# Patient Record
Sex: Male | Born: 1950
Health system: Southern US, Community
[De-identification: ages and names within clinical notes are randomized; demographics above are authoritative.]

## PROBLEM LIST (undated history)

## (undated) DIAGNOSIS — R569 Unspecified convulsions: Secondary | ICD-10-CM

## (undated) DIAGNOSIS — Z8673 Personal history of transient ischemic attack (TIA), and cerebral infarction without residual deficits: Secondary | ICD-10-CM

## (undated) DIAGNOSIS — E785 Hyperlipidemia, unspecified: Secondary | ICD-10-CM

## (undated) DIAGNOSIS — R413 Other amnesia: Secondary | ICD-10-CM

## (undated) DIAGNOSIS — K648 Other hemorrhoids: Secondary | ICD-10-CM

## (undated) DIAGNOSIS — F1020 Alcohol dependence, uncomplicated: Secondary | ICD-10-CM

## (undated) DIAGNOSIS — G5622 Lesion of ulnar nerve, left upper limb: Secondary | ICD-10-CM

## (undated) DIAGNOSIS — Z7982 Long term (current) use of aspirin: Secondary | ICD-10-CM

## (undated) DIAGNOSIS — F0390 Unspecified dementia without behavioral disturbance: Secondary | ICD-10-CM

## (undated) DIAGNOSIS — K061 Gingival enlargement: Secondary | ICD-10-CM

## (undated) DIAGNOSIS — I639 Cerebral infarction, unspecified: Secondary | ICD-10-CM

## (undated) DIAGNOSIS — K449 Diaphragmatic hernia without obstruction or gangrene: Secondary | ICD-10-CM

## (undated) DIAGNOSIS — R269 Unspecified abnormalities of gait and mobility: Secondary | ICD-10-CM

## (undated) DIAGNOSIS — D126 Benign neoplasm of colon, unspecified: Secondary | ICD-10-CM

## (undated) DIAGNOSIS — I69398 Other sequelae of cerebral infarction: Secondary | ICD-10-CM

## (undated) DIAGNOSIS — B182 Chronic viral hepatitis C: Secondary | ICD-10-CM

## (undated) DIAGNOSIS — F1021 Alcohol dependence, in remission: Secondary | ICD-10-CM

## (undated) DIAGNOSIS — K743 Primary biliary cirrhosis: Secondary | ICD-10-CM

## (undated) DIAGNOSIS — I69359 Hemiplegia and hemiparesis following cerebral infarction affecting unspecified side: Secondary | ICD-10-CM

## (undated) DIAGNOSIS — K5790 Diverticulosis of intestine, part unspecified, without perforation or abscess without bleeding: Secondary | ICD-10-CM

## (undated) DIAGNOSIS — K219 Gastro-esophageal reflux disease without esophagitis: Secondary | ICD-10-CM

## (undated) HISTORY — DX: Gingival enlargement: K06.1

## (undated) HISTORY — DX: Alcohol dependence, in remission: F10.21

## (undated) HISTORY — DX: Chronic viral hepatitis C: B18.2

## (undated) HISTORY — DX: Unspecified abnormalities of gait and mobility: R26.9

## (undated) HISTORY — DX: Other sequelae of cerebral infarction: I69.398

## (undated) HISTORY — DX: Cerebral infarction, unspecified: I63.9

## (undated) HISTORY — DX: Unspecified convulsions: R56.9

## (undated) HISTORY — DX: Benign neoplasm of colon, unspecified: D12.6

## (undated) HISTORY — DX: Unspecified dementia, unspecified severity, without behavioral disturbance, psychotic disturbance, mood disturbance, and anxiety: F03.90

## (undated) HISTORY — DX: Diaphragmatic hernia without obstruction or gangrene: K44.9

## (undated) HISTORY — DX: Diverticulosis of intestine, part unspecified, without perforation or abscess without bleeding: K57.90

## (undated) HISTORY — DX: Hemiplegia and hemiparesis following cerebral infarction affecting unspecified side: I69.359

## (undated) HISTORY — DX: Primary biliary cirrhosis: K74.3

## (undated) HISTORY — DX: Other hemorrhoids: K64.8

## (undated) HISTORY — DX: Personal history of transient ischemic attack (TIA), and cerebral infarction without residual deficits: Z86.73

## (undated) HISTORY — DX: Lesion of ulnar nerve, left upper limb: G56.22

## (undated) HISTORY — PX: WRIST SURGERY: SHX841

## (undated) HISTORY — DX: Other amnesia: R41.3

## (undated) HISTORY — DX: Alcohol dependence, uncomplicated: F10.20

---

## 1998-02-20 ENCOUNTER — Emergency Department (HOSPITAL_COMMUNITY): Admission: EM | Admit: 1998-02-20 | Discharge: 1998-02-20 | Payer: Self-pay | Admitting: Emergency Medicine

## 1998-07-04 ENCOUNTER — Emergency Department (HOSPITAL_COMMUNITY): Admission: EM | Admit: 1998-07-04 | Discharge: 1998-07-04 | Payer: Self-pay | Admitting: Emergency Medicine

## 1999-02-02 ENCOUNTER — Encounter: Payer: Self-pay | Admitting: Emergency Medicine

## 1999-02-03 ENCOUNTER — Inpatient Hospital Stay (HOSPITAL_COMMUNITY): Admission: EM | Admit: 1999-02-03 | Discharge: 1999-02-16 | Payer: Self-pay | Admitting: Emergency Medicine

## 1999-02-03 ENCOUNTER — Encounter: Payer: Self-pay | Admitting: Neurology

## 1999-02-07 ENCOUNTER — Encounter: Payer: Self-pay | Admitting: Neurology

## 1999-02-08 ENCOUNTER — Encounter: Payer: Self-pay | Admitting: Neurology

## 1999-02-10 ENCOUNTER — Encounter: Payer: Self-pay | Admitting: *Deleted

## 1999-02-12 ENCOUNTER — Encounter: Payer: Self-pay | Admitting: Neurology

## 1999-02-14 ENCOUNTER — Encounter: Payer: Self-pay | Admitting: Neurology

## 1999-04-04 ENCOUNTER — Emergency Department (HOSPITAL_COMMUNITY): Admission: EM | Admit: 1999-04-04 | Discharge: 1999-04-04 | Payer: Self-pay | Admitting: Emergency Medicine

## 1999-05-30 ENCOUNTER — Inpatient Hospital Stay (HOSPITAL_COMMUNITY): Admission: EM | Admit: 1999-05-30 | Discharge: 1999-06-03 | Payer: Self-pay | Admitting: Emergency Medicine

## 1999-05-31 ENCOUNTER — Encounter: Payer: Self-pay | Admitting: *Deleted

## 1999-07-05 ENCOUNTER — Ambulatory Visit (HOSPITAL_BASED_OUTPATIENT_CLINIC_OR_DEPARTMENT_OTHER): Admission: RE | Admit: 1999-07-05 | Discharge: 1999-07-05 | Payer: Self-pay | Admitting: Orthopedic Surgery

## 1999-07-13 ENCOUNTER — Encounter: Admission: RE | Admit: 1999-07-13 | Discharge: 1999-08-24 | Payer: Self-pay | Admitting: Orthopedic Surgery

## 2000-03-08 ENCOUNTER — Encounter: Admission: RE | Admit: 2000-03-08 | Discharge: 2000-03-08 | Payer: Self-pay | Admitting: Cardiology

## 2000-03-08 ENCOUNTER — Encounter: Payer: Self-pay | Admitting: Cardiology

## 2003-01-24 ENCOUNTER — Emergency Department (HOSPITAL_COMMUNITY): Admission: EM | Admit: 2003-01-24 | Discharge: 2003-01-24 | Payer: Self-pay | Admitting: Emergency Medicine

## 2004-10-02 ENCOUNTER — Emergency Department (HOSPITAL_COMMUNITY): Admission: EM | Admit: 2004-10-02 | Discharge: 2004-10-02 | Payer: Self-pay | Admitting: Emergency Medicine

## 2006-07-27 ENCOUNTER — Emergency Department (HOSPITAL_COMMUNITY): Admission: EM | Admit: 2006-07-27 | Discharge: 2006-07-27 | Payer: Self-pay | Admitting: Emergency Medicine

## 2007-06-04 ENCOUNTER — Emergency Department (HOSPITAL_COMMUNITY): Admission: EM | Admit: 2007-06-04 | Discharge: 2007-06-04 | Payer: Self-pay | Admitting: Emergency Medicine

## 2009-03-10 ENCOUNTER — Emergency Department (HOSPITAL_COMMUNITY): Admission: EM | Admit: 2009-03-10 | Discharge: 2009-03-10 | Payer: Self-pay | Admitting: Emergency Medicine

## 2009-03-11 ENCOUNTER — Inpatient Hospital Stay (HOSPITAL_COMMUNITY): Admission: EM | Admit: 2009-03-11 | Discharge: 2009-03-12 | Payer: Self-pay | Admitting: Pathology

## 2009-06-15 ENCOUNTER — Emergency Department (HOSPITAL_COMMUNITY): Admission: EM | Admit: 2009-06-15 | Discharge: 2009-06-15 | Payer: Self-pay | Admitting: Emergency Medicine

## 2009-12-25 ENCOUNTER — Emergency Department (HOSPITAL_COMMUNITY): Admission: EM | Admit: 2009-12-25 | Discharge: 2009-12-25 | Payer: Self-pay | Admitting: Emergency Medicine

## 2010-05-01 ENCOUNTER — Emergency Department (HOSPITAL_COMMUNITY): Admission: EM | Admit: 2010-05-01 | Discharge: 2010-05-01 | Payer: Self-pay | Admitting: Family Medicine

## 2010-05-03 ENCOUNTER — Emergency Department (HOSPITAL_COMMUNITY): Admission: EM | Admit: 2010-05-03 | Discharge: 2010-05-03 | Payer: Self-pay | Admitting: Emergency Medicine

## 2010-05-07 ENCOUNTER — Emergency Department (HOSPITAL_COMMUNITY): Admission: EM | Admit: 2010-05-07 | Discharge: 2010-05-07 | Payer: Self-pay | Admitting: Emergency Medicine

## 2010-05-16 ENCOUNTER — Emergency Department (HOSPITAL_COMMUNITY): Admission: EM | Admit: 2010-05-16 | Discharge: 2010-05-16 | Payer: Self-pay | Admitting: Emergency Medicine

## 2011-01-06 LAB — COMPREHENSIVE METABOLIC PANEL
ALT: 10 U/L (ref 0–53)
AST: 14 U/L (ref 0–37)
Albumin: 3.4 g/dL — ABNORMAL LOW (ref 3.5–5.2)
Alkaline Phosphatase: 87 U/L (ref 39–117)
BUN: 6 mg/dL (ref 6–23)
Creatinine, Ser: 0.74 mg/dL (ref 0.4–1.5)
GFR calc Af Amer: >60 mL/min (ref >60)
Glucose, Bld: 87 mg/dL (ref 70–99)
Potassium: 4.3 mEq/L (ref 3.5–5.1)
Sodium: 132 mEq/L — ABNORMAL LOW (ref 135–145)
Total Bilirubin: 0.4 mg/dL (ref 0.3–1.2)
Total Protein: 7 g/dL (ref 6.0–8.3)

## 2011-01-06 LAB — DIFFERENTIAL
Basophils Absolute: 0 10*3/uL (ref 0.0–0.1)
Basophils Relative: 0 % (ref 0–1)
Eosinophils Absolute: 0 10*3/uL (ref 0.0–0.7)
Eosinophils Relative: 0 % (ref 0–5)
Lymphocytes Relative: 29 % (ref 12–46)
Lymphs Abs: 1.7 10*3/uL (ref 0.7–4.0)
Monocytes Absolute: 0.6 10*3/uL (ref 0.1–1.0)
Monocytes Relative: 10 % (ref 3–12)
Neutro Abs: 3.6 10*3/uL (ref 1.7–7.7)
Neutrophils Relative %: 61 % (ref 43–77)

## 2011-01-06 LAB — CBC
HCT: 35.6 % — ABNORMAL LOW (ref 39.0–52.0)
Hemoglobin: 12.5 g/dL — ABNORMAL LOW (ref 13.0–17.0)
MCH: 37 pg — ABNORMAL HIGH (ref 26.0–34.0)
MCV: 105.6 fL — ABNORMAL HIGH (ref 78.0–100.0)
Platelets: 123 10*3/uL — ABNORMAL LOW (ref 150–400)
RBC: 3.37 MIL/uL — ABNORMAL LOW (ref 4.22–5.81)
RDW: 12.9 % (ref 11.5–15.5)
WBC: 5.9 10*3/uL (ref 4.0–10.5)

## 2011-01-06 LAB — URINALYSIS, ROUTINE W REFLEX MICROSCOPIC
Glucose, UA: NEGATIVE mg/dL
Hgb urine dipstick: NEGATIVE
Ketones, ur: NEGATIVE mg/dL
Nitrite: NEGATIVE
Protein, ur: NEGATIVE mg/dL
Specific Gravity, Urine: 1.016 (ref 1.005–1.030)
Urobilinogen, UA: 1 mg/dL (ref 0.0–1.0)
pH: 7.5 (ref 5.0–8.0)

## 2011-01-06 LAB — VALPROIC ACID LEVEL: Valproic Acid Lvl: 95.1 ug/mL (ref 50.0–100.0)

## 2011-01-14 LAB — DIFFERENTIAL
Basophils Absolute: 0 10*3/uL (ref 0.0–0.1)
Basophils Relative: 1 % (ref 0–1)
Eosinophils Relative: 1 % (ref 0–5)
Lymphocytes Relative: 39 % (ref 12–46)
Lymphs Abs: 2.1 10*3/uL (ref 0.7–4.0)
Monocytes Absolute: 0.6 10*3/uL (ref 0.1–1.0)
Monocytes Relative: 12 % (ref 3–12)
Neutro Abs: 2.5 10*3/uL (ref 1.7–7.7)

## 2011-01-14 LAB — BASIC METABOLIC PANEL
BUN: 13 mg/dL (ref 6–23)
CO2: 29 mEq/L (ref 19–32)
Calcium: 8.7 mg/dL (ref 8.4–10.5)
Creatinine, Ser: 0.74 mg/dL (ref 0.4–1.5)
GFR calc Af Amer: >60 mL/min (ref >60)
GFR calc non Af Amer: >60 mL/min (ref >60)
Glucose, Bld: 88 mg/dL (ref 70–99)
Potassium: 4.7 mEq/L (ref 3.5–5.1)
Sodium: 139 mEq/L (ref 135–145)

## 2011-01-14 LAB — URINALYSIS, ROUTINE W REFLEX MICROSCOPIC
Bilirubin Urine: NEGATIVE
Hgb urine dipstick: NEGATIVE
Nitrite: NEGATIVE
Protein, ur: NEGATIVE mg/dL
Specific Gravity, Urine: 1.019 (ref 1.005–1.030)
Urobilinogen, UA: 1 mg/dL (ref 0.0–1.0)
pH: 8 (ref 5.0–8.0)

## 2011-01-14 LAB — CBC
HCT: 36.1 % — ABNORMAL LOW (ref 39.0–52.0)
Hemoglobin: 12.4 g/dL — ABNORMAL LOW (ref 13.0–17.0)
MCHC: 34.5 g/dL (ref 30.0–36.0)
MCV: 107.3 fL — ABNORMAL HIGH (ref 78.0–100.0)
RBC: 3.36 MIL/uL — ABNORMAL LOW (ref 4.22–5.81)
RDW: 11.9 % (ref 11.5–15.5)
WBC: 5.3 10*3/uL (ref 4.0–10.5)

## 2011-01-14 LAB — PHENYTOIN LEVEL, TOTAL: Phenytoin Lvl: 12.9 ug/mL (ref 10.0–20.0)

## 2011-01-14 LAB — VALPROIC ACID LEVEL: Valproic Acid Lvl: 97.4 ug/mL (ref 50.0–100.0)

## 2011-01-27 LAB — VALPROIC ACID LEVEL: Valproic Acid Lvl: 51.1 ug/mL (ref 50.0–100.0)

## 2011-01-27 LAB — POCT I-STAT, CHEM 8
Chloride: 101 mEq/L (ref 96–112)
Glucose, Bld: 85 mg/dL (ref 70–99)
HCT: 38 % — ABNORMAL LOW (ref 39.0–52.0)
Hemoglobin: 12.9 g/dL — ABNORMAL LOW (ref 13.0–17.0)
Potassium: 4.7 mEq/L (ref 3.5–5.1)
Sodium: 140 mEq/L (ref 135–145)

## 2011-01-27 LAB — GLUCOSE, CAPILLARY: Glucose-Capillary: 78 mg/dL (ref 70–99)

## 2011-01-27 LAB — PHENYTOIN LEVEL, TOTAL: Phenytoin Lvl: 16.5 ug/mL (ref 10.0–20.0)

## 2011-01-30 LAB — RAPID URINE DRUG SCREEN, HOSP PERFORMED
Barbiturates: NOT DETECTED
Barbiturates: NOT DETECTED
Benzodiazepines: NOT DETECTED
Cocaine: NOT DETECTED
Cocaine: NOT DETECTED
Opiates: NOT DETECTED
Opiates: NOT DETECTED

## 2011-01-30 LAB — FUNGUS CULTURE W SMEAR

## 2011-01-30 LAB — CBC
Hemoglobin: 14.3 g/dL (ref 13.0–17.0)
Hemoglobin: 14.6 g/dL (ref 13.0–17.0)
MCHC: 35.6 g/dL (ref 30.0–36.0)
MCV: 99.8 fL (ref 78.0–100.0)
MCV: 99.9 fL (ref 78.0–100.0)
RBC: 4.05 MIL/uL — ABNORMAL LOW (ref 4.22–5.81)
RBC: 4.1 MIL/uL — ABNORMAL LOW (ref 4.22–5.81)
RDW: 12.5 % (ref 11.5–15.5)
WBC: 4.9 10*3/uL (ref 4.0–10.5)

## 2011-01-30 LAB — DIFFERENTIAL
Lymphocytes Relative: 33 % (ref 12–46)
Lymphs Abs: 1.3 10*3/uL (ref 0.7–4.0)
Lymphs Abs: 2.1 10*3/uL (ref 0.7–4.0)
Monocytes Relative: 12 % (ref 3–12)
Monocytes Relative: 12 % (ref 3–12)
Neutro Abs: 2.1 10*3/uL (ref 1.7–7.7)
Neutro Abs: 2.1 10*3/uL (ref 1.7–7.7)
Neutrophils Relative %: 43 % (ref 43–77)
Neutrophils Relative %: 52 % (ref 43–77)

## 2011-01-30 LAB — COMPREHENSIVE METABOLIC PANEL
Albumin: 3.7 g/dL (ref 3.5–5.2)
Alkaline Phosphatase: 68 U/L (ref 39–117)
BUN: 10 mg/dL (ref 6–23)
CO2: 28 mEq/L (ref 19–32)
CO2: 30 mEq/L (ref 19–32)
Calcium: 9.3 mg/dL (ref 8.4–10.5)
Chloride: 100 mEq/L (ref 96–112)
Creatinine, Ser: 0.83 mg/dL (ref 0.4–1.5)
Creatinine, Ser: 0.84 mg/dL (ref 0.4–1.5)
GFR calc Af Amer: >60 mL/min (ref >60)
GFR calc non Af Amer: >60 mL/min (ref >60)
GFR calc non Af Amer: >60 mL/min (ref >60)
Glucose, Bld: 89 mg/dL (ref 70–99)
Potassium: 4 mEq/L (ref 3.5–5.1)
Sodium: 133 mEq/L — ABNORMAL LOW (ref 135–145)
Total Bilirubin: 0.7 mg/dL (ref 0.3–1.2)
Total Protein: 7.8 g/dL (ref 6.0–8.3)

## 2011-01-30 LAB — GLUCOSE, CAPILLARY: Glucose-Capillary: 81 mg/dL (ref 70–99)

## 2011-01-30 LAB — URINALYSIS, ROUTINE W REFLEX MICROSCOPIC
Glucose, UA: NEGATIVE mg/dL
Hgb urine dipstick: NEGATIVE
Nitrite: NEGATIVE
Protein, ur: NEGATIVE mg/dL
Specific Gravity, Urine: 1.018 (ref 1.005–1.030)
Specific Gravity, Urine: 1.021 (ref 1.005–1.030)
Urobilinogen, UA: 2 mg/dL — ABNORMAL HIGH (ref 0.0–1.0)
pH: 7.5 (ref 5.0–8.0)
pH: 7.5 (ref 5.0–8.0)

## 2011-01-30 LAB — CSF CELL COUNT WITH DIFFERENTIAL
RBC Count, CSF: 0 /mm3
Tube #: 4
WBC, CSF: 2 /mm3 (ref 0–5)

## 2011-01-30 LAB — CSF CULTURE W GRAM STAIN: Culture: NO GROWTH

## 2011-01-30 LAB — CULTURE, BLOOD (ROUTINE X 2): Culture: NO GROWTH

## 2011-01-30 LAB — VALPROIC ACID LEVEL
Valproic Acid Lvl: 124.9 ug/mL — ABNORMAL HIGH (ref 50.0–100.0)
Valproic Acid Lvl: 69.1 ug/mL (ref 50.0–100.0)

## 2011-01-30 LAB — HSV PCR: HSV, PCR: NOT DETECTED

## 2011-01-30 LAB — PROTEIN AND GLUCOSE, CSF: Total  Protein, CSF: 92 mg/dL — ABNORMAL HIGH (ref 15–45)

## 2011-03-06 NOTE — Procedures (Signed)
EEG NUMBER:  [id number].   CLINICAL HISTORY:  This 60 year old patient is being evaluated for  seizure.  Medication listed are Haldol, Ativan, Dilantin, Depacon, and  Tylenol.   This is a portable EEG recorded with the patient asleep using standard  10/20 electrode placement on a 17-channel machine.   Background awake rhythm consists of 8-9 Hz alpha which is of diminished  amplitude, synchronous, reactive to eye opening and closure.  No  paroxysmal epileptiform activities, spikes or sharp waves are noted.  Stages I and II of light sleep are noted and uneventful.  Length of the  recording is 20.7 minutes.  Technical component is average.  EKG tracing  reveals regular sinus rhythm.  Hyperventilation is not performed.  Photic stimulation is unremarkable.   IMPRESSION:  This EEG performed during awake and asleep state is within  normal limits.  No definite epileptiform features were noted.           ______________________________  Sunny Schlein. Pearlean Brownie, MD     ZOX:[id number]  D:  03/11/2009 16:15:05  T:  03/12/2009 08:18:01  Job #:  [id number]   cc:   Levert Feinstein, MD

## 2011-03-06 NOTE — H&P (Signed)
[name], [name] NO.:  [id number]   MEDICAL RECORD NO.:  [id number]          PATIENT TYPE:  INP   LOCATION:  3732                         FACILITY:  [facility]   PHYSICIAN:  Levert Feinstein, MD          DATE OF BIRTH:  12-Jun-1951   DATE OF ADMISSION:  03/10/2009  DATE OF DISCHARGE:                              HISTORY & PHYSICAL   CHIEF COMPLAINT:  Recurrent seizure x3.   HISTORY OF PRESENT ILLNESS:  The patient is a 60 year old African  American male accompanied by 2 of his sister at ER visit.   He was Dr. Imagene [name] patient, for epilepsy, he developed seizure disorder  about 10 years ago, had a history of alcohol abuse, but quit about 10  years ago also suffered a stroke involving right frontal parietal  regions.  At baseline, his seizure is well controlled, recurrent about  every 4 months, taking Depakote 500 mg 3 tablets and Dilantin 100 mg 1  tablet b.i.d.  Today, VPA 69.5, Dilantin 21.6.   He had one seizure at 4:00 p.m., fell to the ground whole body shaking,  lasted about a couple of minutes followed by confusion for 15 minutes  taken to the emergency room, back to baseline, later discharged home.   Second seizure was on 7:30pm, he was having dinner with family and  noticed to have forceful head deviation to his left side, whole body  tonic-clonic movement, again lasted couple of minutes and taken back to  the emergency room, had another seizure where at ED around 9:30pm, whole  body tonic-clonic movement lasted for few minutes.  When checking on him  about 1 hour later, he was little bit confused and more memory trouble  than baseline, he was not able to recognize his family member, but know  his name and know he is at the hospital.   REVIEW OF SYSTEMS:  Denies fever, fairly good appetite, no coughing and  now he complainsof a mild headache.   PAST MEDICAL HISTORY:  Stroke and seizure disorder and a remote history  of alcohol abuse.   SURGICAL HISTORY:   Left hand surgery.   SOCIAL HISTORY:  He lives with his elderly sister who has been taking  care of him and compliant with the medications and he smokes, but no  drugs, no drinking.   FAMILY HISTORY:  Noncontributory.   HOME MEDICATIONS:  1. Depakote 500 mg 3 b.i.d.  2. Dilantin 100 mg 1 tablet b.i.d.   ALLERGIES:  No known drug allergies.   PHYSICAL EXAMINATION:  VITAL SIGNS:  Temperature was 97.6, blood  pressure 134/71, heart rate of 73, and respirations of 16.  CARDIAC:  Regular rate and rhythm.  PULMONARY:  Clear to auscultation bilaterally.  Awake, agitated and not  recognizing his family members, but oriented to hospital and his name.  Cranial nerves II through XII was normal.  Motor examination has mild  spastic left upper extremity weakness, deformity of left hand due to  previous surgery.  Deep tendon reflexes normal and symmetric.  Plantar  responses were flexor.  CT of the brain without contrast revealing right  frontal parietal area, old CVA, no acute lesions Dilantin 21.6, VPA  69.5.   ASSESSMENT AND PLAN:  Recurrent seizure.  1. Increase Depakote to maintenance 2000 mg b.i.d.  We will load with      IV now.  2. Keep  Dilantin 100 b.i.d., admit to the hospital for close      monitoring.      Levert Feinstein, MD  Electronically Signed     YY/MEDQ  D:  03/10/2009  T:  03/11/2009  Job:  [id number]

## 2011-03-09 NOTE — Discharge Summary (Signed)
[name], MACLAUGHLIN NO.:  [id number]   MEDICAL RECORD NO.:  [id number]          PATIENT TYPE:  INP   LOCATION:  3732                         FACILITY:  [facility]   PHYSICIAN:  Levert Feinstein, MD          DATE OF BIRTH:  11/17/1950   DATE OF ADMISSION:  03/10/2009  DATE OF DISCHARGE:  03/12/2009                               DISCHARGE SUMMARY   DISCHARGE DIAGNOSES:  1. Recurrent seizure.  2. history of stroke involving right frontoparietal region.  3. Smoker.  4. Epilepsy.  5. Remote history of alcohol abuse.   DISCHARGE MEDICATIONS:  1. Depakote 2000 mg b.i.d. (500 mg 4 tablets b.i.d.).  2. Dilantin 100 mg b.i.d.   HOSPITAL COURSE:  The patient is a 60 year old African American male  with history of seizure for 10 years, had a remote history of alcohol  abuse, also with a history of stroke involving right frontoparietal  region.  At baseline, his seizure was well controlled, recurrent about  every 4 months, prior to his hospital admission on Mar 10, 2009, he was  taking Depakote 500 three tablets b.i.d., and Dilantin 100 mg b.i.d.  Upon admission, Depakote level was 69.5, Dilantin level 21.6.   On Mar 10, 2009, he had recurrent seizure at 4:00 p.m., later had 7:30  p.m., and also 9:30 p.m.  He had 3 seizures within 12 hours period of  time, described forceful head deviation to the left side, followed by  whole body tonic-clonic movements lasted couple of minutes, and post-  event confusion.   During his hospital staying, he was also noticed to have fever, T-max is  101.5.  For that reason, he has undergone fluoro-guided lumbar puncture,  which showed WBC of 2, RBC of 0, protein of 92, glucose of 33, but  herpes simplex virus PCR was negative.  Blood culture was negative.  Lab  evaluation demonstrated normal CBC, CMP with the exception of mildly  decreased sodium of 133. Chest x-ray was clear.   There was no recurrent seizure.  The patient was back to his  baseline  and discharged home.   On examination, he is awake, alert, oriented to his name and place.  Cranial nerve II through XII examination was normal.  motor examination,  he has had left hand deformity due to previous surgery.  There was also  mild spastic left-sided weakness, and deep tendon reflex was normal and  symmetric.  Plantar responses were flexor.   CAT scan of the brain has demonstrated right frontoparietal area old  stroke but no acute lesion.   He is discharged home with medications:  1. Depakote 500 mg 4 tablets b.i.d.  2. Dilantin 100 mg b.i.d.   Follow up with Dr. Sandria Manly in 1-2 months.      Levert Feinstein, MD  Electronically Signed     Levert Feinstein, MD  Electronically Signed    YY/MEDQ  D:  05/03/2009  T:  05/03/2009  Job:  [id number]

## 2011-04-07 ENCOUNTER — Emergency Department (HOSPITAL_COMMUNITY)
Admission: EM | Admit: 2011-04-07 | Discharge: 2011-04-07 | Disposition: A | Discharge status: Home / Self Care | Payer: Medicare Other | Attending: Emergency Medicine | Admitting: Emergency Medicine

## 2011-04-07 DIAGNOSIS — I10 Essential (primary) hypertension: Secondary | ICD-10-CM | POA: Insufficient documentation

## 2011-04-07 DIAGNOSIS — Z8673 Personal history of transient ischemic attack (TIA), and cerebral infarction without residual deficits: Secondary | ICD-10-CM | POA: Insufficient documentation

## 2011-04-07 DIAGNOSIS — Z9119 Patient's noncompliance with other medical treatment and regimen: Secondary | ICD-10-CM | POA: Insufficient documentation

## 2011-04-07 DIAGNOSIS — G40909 Epilepsy, unspecified, not intractable, without status epilepticus: Secondary | ICD-10-CM | POA: Insufficient documentation

## 2011-04-07 DIAGNOSIS — Z79899 Other long term (current) drug therapy: Secondary | ICD-10-CM | POA: Insufficient documentation

## 2011-04-07 DIAGNOSIS — E7800 Pure hypercholesterolemia, unspecified: Secondary | ICD-10-CM | POA: Insufficient documentation

## 2011-04-07 DIAGNOSIS — Z91199 Patient's noncompliance with other medical treatment and regimen due to unspecified reason: Secondary | ICD-10-CM | POA: Insufficient documentation

## 2011-04-07 LAB — CBC
Hemoglobin: 13.5 g/dL (ref 13.0–17.0)
MCH: 35.9 pg — ABNORMAL HIGH (ref 26.0–34.0)
MCHC: 35.9 g/dL (ref 30.0–36.0)
RDW: 12.8 % (ref 11.5–15.5)

## 2011-04-07 LAB — BASIC METABOLIC PANEL
BUN: 13 mg/dL (ref 6–23)
Calcium: 9.1 mg/dL (ref 8.4–10.5)
GFR calc non Af Amer: >60 mL/min (ref >60)
Glucose, Bld: 82 mg/dL (ref 70–99)
Sodium: 138 mEq/L (ref 135–145)

## 2011-04-07 LAB — DIFFERENTIAL
Basophils Absolute: 0 10*3/uL (ref 0.0–0.1)
Eosinophils Absolute: 0.2 10*3/uL (ref 0.0–0.7)
Lymphocytes Relative: 39 % (ref 12–46)
Lymphs Abs: 2.5 10*3/uL (ref 0.7–4.0)
Neutrophils Relative %: 43 % (ref 43–77)

## 2011-04-20 ENCOUNTER — Emergency Department (HOSPITAL_COMMUNITY): Payer: Medicare Other

## 2011-04-20 ENCOUNTER — Emergency Department (HOSPITAL_COMMUNITY)
Admission: EM | Admit: 2011-04-20 | Discharge: 2011-04-20 | Disposition: A | Discharge status: Home / Self Care | Payer: Medicare Other | Attending: Emergency Medicine | Admitting: Emergency Medicine

## 2011-04-20 DIAGNOSIS — I10 Essential (primary) hypertension: Secondary | ICD-10-CM | POA: Insufficient documentation

## 2011-04-20 DIAGNOSIS — R5383 Other fatigue: Secondary | ICD-10-CM | POA: Insufficient documentation

## 2011-04-20 DIAGNOSIS — R5381 Other malaise: Secondary | ICD-10-CM | POA: Insufficient documentation

## 2011-04-20 DIAGNOSIS — E7800 Pure hypercholesterolemia, unspecified: Secondary | ICD-10-CM | POA: Insufficient documentation

## 2011-04-20 DIAGNOSIS — R404 Transient alteration of awareness: Secondary | ICD-10-CM | POA: Insufficient documentation

## 2011-04-20 DIAGNOSIS — G40909 Epilepsy, unspecified, not intractable, without status epilepticus: Secondary | ICD-10-CM | POA: Insufficient documentation

## 2011-04-20 DIAGNOSIS — Z8673 Personal history of transient ischemic attack (TIA), and cerebral infarction without residual deficits: Secondary | ICD-10-CM | POA: Insufficient documentation

## 2011-04-20 LAB — COMPREHENSIVE METABOLIC PANEL
AST: 23 U/L (ref 0–37)
Albumin: 3.5 g/dL (ref 3.5–5.2)
Calcium: 8.9 mg/dL (ref 8.4–10.5)
Creatinine, Ser: 0.79 mg/dL (ref 0.50–1.35)
GFR calc non Af Amer: >60 mL/min (ref >60)

## 2011-04-20 LAB — CBC
MCH: 35.2 pg — ABNORMAL HIGH (ref 26.0–34.0)
MCHC: 34.8 g/dL (ref 30.0–36.0)
MCV: 101.3 fL — ABNORMAL HIGH (ref 78.0–100.0)
Platelets: DECREASED 10*3/uL (ref 150–400)
RDW: 12.9 % (ref 11.5–15.5)

## 2011-04-20 LAB — DIFFERENTIAL
Eosinophils Absolute: 0.2 10*3/uL (ref 0.0–0.7)
Eosinophils Relative: 3 % (ref 0–5)
Lymphs Abs: 2.6 10*3/uL (ref 0.7–4.0)
Monocytes Relative: 14 % — ABNORMAL HIGH (ref 3–12)

## 2011-04-20 LAB — VALPROIC ACID LEVEL: Valproic Acid Lvl: 89.3 ug/mL (ref 50.0–100.0)

## 2011-08-06 LAB — DIFFERENTIAL
Eosinophils Absolute: 0
Lymphocytes Relative: 17
Lymphs Abs: 1.2
Monocytes Relative: 3
Neutrophils Relative %: 79 — ABNORMAL HIGH

## 2011-08-06 LAB — CBC
MCV: 100.8 — ABNORMAL HIGH
RBC: 3.99 — ABNORMAL LOW
WBC: 7.1

## 2011-08-06 LAB — BASIC METABOLIC PANEL
Chloride: 102
Creatinine, Ser: 0.86
GFR calc Af Amer: >60
Potassium: 5.3 — ABNORMAL HIGH

## 2011-08-06 LAB — VALPROIC ACID LEVEL: Valproic Acid Lvl: 57.9

## 2012-07-24 DIAGNOSIS — I6330 Cerebral infarction due to thrombosis of unspecified cerebral artery: Secondary | ICD-10-CM | POA: Insufficient documentation

## 2012-07-24 DIAGNOSIS — F329 Major depressive disorder, single episode, unspecified: Secondary | ICD-10-CM | POA: Insufficient documentation

## 2012-07-24 DIAGNOSIS — G40209 Localization-related (focal) (partial) symptomatic epilepsy and epileptic syndromes with complex partial seizures, not intractable, without status epilepticus: Secondary | ICD-10-CM | POA: Insufficient documentation

## 2012-07-24 DIAGNOSIS — Z79899 Other long term (current) drug therapy: Secondary | ICD-10-CM | POA: Insufficient documentation

## 2012-07-24 DIAGNOSIS — R269 Unspecified abnormalities of gait and mobility: Secondary | ICD-10-CM | POA: Insufficient documentation

## 2012-07-24 DIAGNOSIS — G5620 Lesion of ulnar nerve, unspecified upper limb: Secondary | ICD-10-CM | POA: Insufficient documentation

## 2012-07-24 DIAGNOSIS — F09 Unspecified mental disorder due to known physiological condition: Secondary | ICD-10-CM | POA: Insufficient documentation

## 2012-07-24 DIAGNOSIS — F068 Other specified mental disorders due to known physiological condition: Secondary | ICD-10-CM | POA: Insufficient documentation

## 2013-03-03 ENCOUNTER — Encounter: Payer: Self-pay | Admitting: Neurology

## 2013-03-03 DIAGNOSIS — G40209 Localization-related (focal) (partial) symptomatic epilepsy and epileptic syndromes with complex partial seizures, not intractable, without status epilepticus: Secondary | ICD-10-CM

## 2013-03-03 DIAGNOSIS — R269 Unspecified abnormalities of gait and mobility: Secondary | ICD-10-CM

## 2013-03-03 DIAGNOSIS — F329 Major depressive disorder, single episode, unspecified: Secondary | ICD-10-CM

## 2013-03-03 DIAGNOSIS — F068 Other specified mental disorders due to known physiological condition: Secondary | ICD-10-CM

## 2013-03-03 DIAGNOSIS — I6330 Cerebral infarction due to thrombosis of unspecified cerebral artery: Secondary | ICD-10-CM

## 2013-03-03 DIAGNOSIS — F09 Unspecified mental disorder due to known physiological condition: Secondary | ICD-10-CM

## 2013-03-03 DIAGNOSIS — Z79899 Other long term (current) drug therapy: Secondary | ICD-10-CM

## 2013-03-04 ENCOUNTER — Encounter: Payer: Self-pay | Admitting: Neurology

## 2013-03-04 ENCOUNTER — Ambulatory Visit (INDEPENDENT_AMBULATORY_CARE_PROVIDER_SITE_OTHER): Payer: Medicare Other | Admitting: Neurology

## 2013-03-04 VITALS — BP 105/65 | HR 64 | Ht 68.0 in | Wt 156.0 lb

## 2013-03-04 DIAGNOSIS — G40209 Localization-related (focal) (partial) symptomatic epilepsy and epileptic syndromes with complex partial seizures, not intractable, without status epilepticus: Secondary | ICD-10-CM

## 2013-03-04 DIAGNOSIS — R269 Unspecified abnormalities of gait and mobility: Secondary | ICD-10-CM

## 2013-03-04 DIAGNOSIS — R413 Other amnesia: Secondary | ICD-10-CM

## 2013-03-04 DIAGNOSIS — Z5181 Encounter for therapeutic drug level monitoring: Secondary | ICD-10-CM

## 2013-03-04 HISTORY — DX: Other amnesia: R41.3

## 2013-03-04 NOTE — Progress Notes (Signed)
Reason for visit: Seizures  [name] is an 62 y.o. male  History of present illness:   [name] is a 62 year old left-handed black male with a history of seizures and cerebrovascular disease. The patient has sustained a right thalamic stroke in 2000, and he has had a chronic right frontal stroke as well. The patient began having seizures that had initially been quite difficult to control. The patient has been on Dilantin and Depakote, and he has not had a seizure in over 2 years. The patient has had a memory disorder since the stroke, and he does not operate a motor vehicle. The patient has a chronic gait disorder, but he has not fallen recently. The patient returns for an evaluation. No other new medical issues have come up since he was here last in the fall of 2013. The patient is tolerating the medications well.  Past Medical History  Diagnosis Date  . History of alcoholism   . Hx of ischemic vertebrobasilar artery thalamic stroke     Right  . Gait disorder   . Dementia   . 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 Seizures   . Memory disorder 03/04/2013  . Gait disorder   . Stroke     Right frontal, right thalamic  . Ulnar neuropathy of left upper extremity   . Alcoholism     History of, not active    Past Surgical History  Procedure Laterality Date  . Wrist surgery Left 95 & 96    Family History  Problem Relation Age of Onset  . Pulmonary embolism Mother   . Liver disease Father     Social history:  reports that he has been smoking.  He does not have any smokeless tobacco history on file. He reports that he does not drink alcohol or use illicit drugs.  Allergies: No Known Allergies  Medications:  Current Outpatient Prescriptions on File Prior to Visit  Medication Sig Dispense Refill  . amLODipine (NORVASC) 2.5 MG tablet Take 2.5 mg by mouth daily.      . Cholecalciferol (VITAMIN D) 2000 UNITS CAPS Take 2,000 Units by mouth daily.      . divalproex  (DEPAKOTE) 500 MG DR tablet Take 1,500 mg by mouth 2 (two) times daily.       . metoprolol succinate (TOPROL-XL) 25 MG 24 hr tablet Take 25 mg by mouth daily.      . phenytoin (DILANTIN) 100 MG ER capsule Take 100 mg by mouth 2 (two) times daily.       . simvastatin (ZOCOR) 20 MG tablet Take 20 mg by mouth every evening.       No current facility-administered medications on file prior to visit.    ROS:  Out of a complete 14 system review of symptoms, the patient complains only of the following symptoms, and all other reviewed systems are negative.  Seizures Gait instability Memory disorder   Blood pressure 105/65, pulse 64, height 5\' 8"  (1.727 m), weight 156 lb (70.761 kg).  Physical Exam  General: The patient is alert and cooperative at the time of the examination.  Skin: No significant peripheral edema is noted.   Neurologic Exam  Mental status: The Mini-Mental status examination done today shows a total score of 28/30. The patient is able to name 8 animals in 60 seconds.  Cranial nerves: Facial symmetry is present. Speech is normal, no aphasia or dysarthria is noted. Extraocular movements are full. Visual fields are  full.  Motor: The patient has good strength in all 4 extremities, with the exception that there is slight, 4+ over 5 strength, throughout the left arm.  Coordination: The patient has good finger-nose-finger bilaterally. The patient has severe apraxia with heel-to-shin bilaterally.  Gait and station: The patient has a wide-based gait, slightly unsteady. Tandem gait is very apraxic, unsteady. Romberg is positive, the patient tends to fall to the right. No drift is seen.  Reflexes: Deep tendon reflexes are symmetric.   Assessment/Plan:  1. History seizures  2. Cerebrovascular disease  3. Gait disorder  4. Memory disorder  The patient has done well with his seizure control. The patient has a history of cerebrovascular disease that is the likely etiology  of his seizures. The patient however, is not on low-dose aspirin. I have recommended that he start aspirin at 81 mg daily. The patient will continue the Depakote and Dilantin, and blood work will be checked today. The patient will followup in one year. The patient does not operate a motor vehicle. The memory issues will be followed over time.  Joshua Palau MD 03/04/2013 7:51 PM  [facility] [address] [place], [place] [address]  Phone [phone] Fax [phone]

## 2013-03-05 ENCOUNTER — Telehealth: Payer: Self-pay | Admitting: Neurology

## 2013-03-05 LAB — COMPREHENSIVE METABOLIC PANEL
Albumin/Globulin Ratio: 1.4 (ref 1.1–2.5)
Albumin: 4.3 g/dL (ref 3.6–4.8)
BUN: 16 mg/dL (ref 8–27)
Calcium: 9.5 mg/dL (ref 8.6–10.2)
Creatinine, Ser: 0.89 mg/dL (ref 0.76–1.27)
GFR calc non Af Amer: 93 mL/min/{1.73_m2} (ref >59)
Globulin, Total: 3.1 g/dL (ref 1.5–4.5)
Glucose: 68 mg/dL (ref 65–99)
Sodium: 140 mmol/L (ref 134–144)
Total Protein: 7.4 g/dL (ref 6.0–8.5)

## 2013-03-05 LAB — CBC WITH DIFFERENTIAL
Eos: 1 % (ref 0–5)
HCT: 40 % (ref 37.5–51.0)
Lymphocytes Absolute: 2.4 10*3/uL (ref 0.7–3.1)
Lymphs: 47 % — ABNORMAL HIGH (ref 14–46)
MCV: 99 fL — ABNORMAL HIGH (ref 79–97)
Monocytes: 11 % (ref 4–12)
Neutrophils Absolute: 2.1 10*3/uL (ref 1.4–7.0)
RBC: 4.06 x10E6/uL — ABNORMAL LOW (ref 4.14–5.80)
WBC: 5.2 10*3/uL (ref 3.4–10.8)

## 2013-03-05 LAB — VALPROIC ACID LEVEL: Valproic Acid Lvl: 131 ug/mL (ref 50–100)

## 2013-03-05 LAB — AMMONIA: Ammonia: 143 ug/dL (ref 27–102)

## 2013-03-05 NOTE — Telephone Encounter (Signed)
I called the caregiver. The Depakote level was in the 130 range, a random level. The ammonia level was elevated as well. I will drop the Depakote dose taking 1000 mg twice daily. We will recheck blood work in 3 weeks.

## 2013-03-06 NOTE — Progress Notes (Signed)
Quick Note:  I called and spoke to caregiver. The pt will take depakote 1000mg  po bid. Will get redraw in 3 wks (trough level). He takes evening dose 7pm. Will bring medication with him and take after blood drawn at 0800 in 3 wks. ______

## 2013-03-27 ENCOUNTER — Telehealth: Payer: Self-pay | Admitting: Neurology

## 2013-03-27 DIAGNOSIS — Z5181 Encounter for therapeutic drug level monitoring: Secondary | ICD-10-CM

## 2013-03-27 NOTE — Telephone Encounter (Signed)
Message copied by Stephanie Acre on Fri Mar 27, 2013  7:56 AM ------      Message from: Stephanie Acre      Created: Thu Mar 05, 2013  5:05 PM       Recheck ammonia level, and Depakote level. ------

## 2013-03-27 NOTE — Telephone Encounter (Signed)
I called patient. The patient did come in to have the Depakote level and ammonia level rechecked.

## 2013-03-27 NOTE — Telephone Encounter (Signed)
Noted  

## 2013-03-30 ENCOUNTER — Other Ambulatory Visit: Payer: Self-pay | Admitting: Neurology

## 2013-03-31 ENCOUNTER — Telehealth: Payer: Self-pay | Admitting: Neurology

## 2013-03-31 LAB — VALPROIC ACID LEVEL: Valproic Acid Lvl: 71 ug/mL (ref 50–100)

## 2013-03-31 LAB — AMMONIA: Ammonia: 136 ug/dL (ref 27–102)

## 2013-03-31 MED ORDER — LEVETIRACETAM 500 MG PO TABS: ORAL_TABLET | ORAL | Status: DC

## 2013-03-31 NOTE — Telephone Encounter (Signed)
I called the caretaker. The ammonia level remains elevated. We will taper down off of the Depakote. The patient will go down by one tablet every 2 weeks until off the medication. The patient will be started on Keppra at to the Dilantin.

## 2013-05-12 ENCOUNTER — Telehealth: Payer: Self-pay | Admitting: Neurology

## 2013-05-12 NOTE — Telephone Encounter (Signed)
I called and left a message for the patient that per Dr. Clarisa Kindred last phone note. Keppra will be add with Dilantin.

## 2013-07-16 ENCOUNTER — Emergency Department (HOSPITAL_COMMUNITY): Payer: Medicare Other

## 2013-07-16 ENCOUNTER — Observation Stay (HOSPITAL_COMMUNITY)
Admission: EM | Admit: 2013-07-16 | Discharge: 2013-07-17 | Disposition: A | Discharge status: Home / Self Care | Payer: Medicare Other | Attending: Internal Medicine | Admitting: Internal Medicine

## 2013-07-16 ENCOUNTER — Encounter (HOSPITAL_COMMUNITY): Payer: Self-pay | Admitting: *Deleted

## 2013-07-16 DIAGNOSIS — I6330 Cerebral infarction due to thrombosis of unspecified cerebral artery: Secondary | ICD-10-CM

## 2013-07-16 DIAGNOSIS — R569 Unspecified convulsions: Principal | ICD-10-CM | POA: Insufficient documentation

## 2013-07-16 DIAGNOSIS — R413 Other amnesia: Secondary | ICD-10-CM

## 2013-07-16 DIAGNOSIS — F1021 Alcohol dependence, in remission: Secondary | ICD-10-CM | POA: Diagnosis not present

## 2013-07-16 DIAGNOSIS — F09 Unspecified mental disorder due to known physiological condition: Secondary | ICD-10-CM

## 2013-07-16 DIAGNOSIS — Z79899 Other long term (current) drug therapy: Secondary | ICD-10-CM

## 2013-07-16 DIAGNOSIS — F068 Other specified mental disorders due to known physiological condition: Secondary | ICD-10-CM

## 2013-07-16 DIAGNOSIS — R269 Unspecified abnormalities of gait and mobility: Secondary | ICD-10-CM | POA: Diagnosis not present

## 2013-07-16 DIAGNOSIS — F329 Major depressive disorder, single episode, unspecified: Secondary | ICD-10-CM

## 2013-07-16 DIAGNOSIS — F0390 Unspecified dementia without behavioral disturbance: Secondary | ICD-10-CM | POA: Diagnosis not present

## 2013-07-16 DIAGNOSIS — Z8673 Personal history of transient ischemic attack (TIA), and cerebral infarction without residual deficits: Secondary | ICD-10-CM | POA: Insufficient documentation

## 2013-07-16 DIAGNOSIS — F3289 Other specified depressive episodes: Secondary | ICD-10-CM

## 2013-07-16 DIAGNOSIS — G40209 Localization-related (focal) (partial) symptomatic epilepsy and epileptic syndromes with complex partial seizures, not intractable, without status epilepticus: Secondary | ICD-10-CM

## 2013-07-16 LAB — HEPATIC FUNCTION PANEL
Albumin: 3.9 g/dL (ref 3.5–5.2)
Bilirubin, Direct: 0.1 mg/dL (ref 0.0–0.3)
Indirect Bilirubin: 0.3 mg/dL (ref 0.3–0.9)
Total Protein: 7.8 g/dL (ref 6.0–8.3)

## 2013-07-16 LAB — CBC WITH DIFFERENTIAL/PLATELET
Basophils Absolute: 0 10*3/uL (ref 0.0–0.1)
Basophils Relative: 0 % (ref 0–1)
Eosinophils Absolute: 0 10*3/uL (ref 0.0–0.7)
Eosinophils Relative: 0 % (ref 0–5)
MCH: 32.7 pg (ref 26.0–34.0)
MCHC: 35.6 g/dL (ref 30.0–36.0)
MCV: 91.7 fL (ref 78.0–100.0)
Platelets: 143 10*3/uL — ABNORMAL LOW (ref 150–400)
RDW: 12.7 % (ref 11.5–15.5)

## 2013-07-16 LAB — BASIC METABOLIC PANEL
Calcium: 9.4 mg/dL (ref 8.4–10.5)
GFR calc Af Amer: >90 mL/min (ref >90)
GFR calc non Af Amer: >90 mL/min (ref >90)
Glucose, Bld: 92 mg/dL (ref 70–99)
Sodium: 134 mEq/L — ABNORMAL LOW (ref 135–145)

## 2013-07-16 LAB — URINALYSIS, ROUTINE W REFLEX MICROSCOPIC
Hgb urine dipstick: NEGATIVE
Protein, ur: NEGATIVE mg/dL
Urobilinogen, UA: 1 mg/dL (ref 0.0–1.0)

## 2013-07-16 MED ORDER — LORAZEPAM 2 MG/ML IJ SOLN: 2.0000 mg | Freq: Four times a day (QID) | INTRAMUSCULAR | Status: DC | PRN

## 2013-07-16 MED ORDER — SODIUM CHLORIDE 0.9 % IV SOLN
INTRAVENOUS | Status: AC
  Administered 2013-07-16: 19:00:00 via INTRAVENOUS

## 2013-07-16 MED ORDER — SIMVASTATIN 20 MG PO TABS
20.0000 mg | ORAL_TABLET | Freq: Every evening | ORAL | Status: DC
  Filled 2013-07-16 (×2): qty 1

## 2013-07-16 MED ORDER — ASPIRIN 81 MG PO TABS: 81.0000 mg | ORAL_TABLET | Freq: Every day | ORAL | Status: DC

## 2013-07-16 MED ORDER — SODIUM CHLORIDE 0.9 % IV SOLN
1000.0000 mg | Freq: Once | INTRAVENOUS | Status: AC
  Administered 2013-07-16: 1000 mg via INTRAVENOUS
  Filled 2013-07-16: qty 10

## 2013-07-16 MED ORDER — LORAZEPAM 2 MG/ML IJ SOLN
4.0000 mg | Freq: Once | INTRAMUSCULAR | Status: AC
  Administered 2013-07-16: 2 mg via INTRAVENOUS

## 2013-07-16 MED ORDER — LORAZEPAM 2 MG/ML IJ SOLN
2.0000 mg | Freq: Once | INTRAMUSCULAR | Status: AC
  Administered 2013-07-16: 2 mg via INTRAVENOUS
  Filled 2013-07-16: qty 1

## 2013-07-16 MED ORDER — METOPROLOL SUCCINATE ER 25 MG PO TB24
25.0000 mg | ORAL_TABLET | Freq: Every day | ORAL | Status: DC
  Administered 2013-07-17: 25 mg via ORAL
  Filled 2013-07-16 (×2): qty 1

## 2013-07-16 MED ORDER — PHENYTOIN SODIUM EXTENDED 100 MG PO CAPS
100.0000 mg | ORAL_CAPSULE | Freq: Two times a day (BID) | ORAL | Status: DC
  Administered 2013-07-17: 100 mg via ORAL
  Filled 2013-07-16 (×3): qty 1

## 2013-07-16 MED ORDER — LORAZEPAM 2 MG/ML IJ SOLN
2.0000 mg | Freq: Once | INTRAMUSCULAR | Status: DC
  Filled 2013-07-16: qty 1

## 2013-07-16 MED ORDER — ASPIRIN EC 81 MG PO TBEC
81.0000 mg | DELAYED_RELEASE_TABLET | Freq: Every day | ORAL | Status: DC
  Administered 2013-07-17: 81 mg via ORAL
  Filled 2013-07-16 (×2): qty 1

## 2013-07-16 MED ORDER — SODIUM CHLORIDE 0.9 % IV SOLN
75.0000 mL/h | INTRAVENOUS | Status: DC
  Administered 2013-07-16: 75 mL/h via INTRAVENOUS

## 2013-07-16 MED ORDER — PHENYTOIN SODIUM 50 MG/ML IJ SOLN
100.0000 mg | Freq: Once | INTRAMUSCULAR | Status: AC
  Administered 2013-07-16: 100 mg via INTRAVENOUS
  Filled 2013-07-16: qty 2

## 2013-07-16 MED ORDER — LEVETIRACETAM 750 MG PO TABS
750.0000 mg | ORAL_TABLET | Freq: Two times a day (BID) | ORAL | Status: DC
  Administered 2013-07-17: 750 mg via ORAL
  Filled 2013-07-16 (×2): qty 1

## 2013-07-16 MED ORDER — LORAZEPAM 2 MG/ML IJ SOLN: 1.0000 mg | Freq: Once | INTRAMUSCULAR | Status: DC

## 2013-07-16 NOTE — ED Notes (Signed)
Attempted lab draw x 1 with no success.  °

## 2013-07-16 NOTE — Progress Notes (Signed)
MEDICATION RELATED CONSULT NOTE - INITIAL   Pharmacy Consult for drug interaction and monitoring of AEDs per pharmacy  Indication: Currently taking keppra and dilantin  No Known Allergies  Patient Measurements:   Total body weight = 70.8 kg (03/04/13)  Vital Signs: Temp: 98.9 F (37.2 C) (09/25 1919) Temp src: Axillary (09/25 1919) BP: 154/71 mmHg (09/25 1919) Pulse Rate: 52 (09/25 1919) Intake/Output from previous day:   Intake/Output from this shift:    Labs:  Phenytoin level = 11.7 mcg/ml Albumin = 3.9  Recent Labs  07/16/13 1353  WBC 6.1  HGB 14.6  HCT 41.0  PLT 143*  CREATININE 0.86  ALBUMIN 3.9  PROT 7.8  AST 16  ALT 10  ALKPHOS 150*  BILITOT 0.4  BILIDIR 0.1  IBILI 0.3   The CrCl is unknown because both a height and weight (above a minimum accepted value) are required for this calculation.   Microbiology: No results found for this or any previous visit (from the past 720 hour(s)).  Medical History: Past Medical History  Diagnosis Date  . History of alcoholism   . Hx of ischemic vertebrobasilar artery thalamic stroke     Right  . Gait disorder   . Dementia   . 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 Seizures   . Memory disorder 03/04/2013  . Gait disorder   . Stroke     Right frontal, right thalamic  . Ulnar neuropathy of left upper extremity   . Alcoholism     History of, not active    Medications:  Prescriptions prior to admission  Medication Sig Dispense Refill  . aspirin 81 MG tablet Take 81 mg by mouth daily.      . Cholecalciferol (VITAMIN D-3) 1000 UNITS CAPS Take 1 capsule by mouth daily.      Marland Kitchen levETIRAcetam (KEPPRA) 500 MG tablet 1/2 tablet twice a day for one week, then take one tablet twice a day  60 tablet  5  . metoprolol succinate (TOPROL-XL) 25 MG 24 hr tablet Take 25 mg by mouth daily.      . phenytoin (DILANTIN) 100 MG ER capsule Take 100 mg by mouth 2 (two) times daily.       . simvastatin (ZOCOR) 20 MG  tablet Take 20 mg by mouth every evening.       Scheduled:  . sodium chloride   Intravenous STAT  . aspirin EC  81 mg Oral Daily  . [START ON 07/17/2013] levETIRAcetam  750 mg Oral BID  . metoprolol succinate  25 mg Oral Daily  . phenytoin  100 mg Oral BID  . simvastatin  20 mg Oral QPM    Assessment: 62 y.o male admitted due to seizure today. He has a h/o seizures, right thalamic stroke in 2000, and chronic right frontal stroke.  PTA he is taking phenytoin ER 100 mg po BID and keppra 500 mg po BID. He received IV keppra 1000 mg IV x 1 in the ED and the neurologist increased the oral keppra to 750mg  po BID. Dilantin has also been continued at home dose. His PTA meds, metoprolol XL, simvastatin, and aspirin have been resumed.  No significant drug interactions found.   LFTs are within normal and phenytoin level of 11.7 mcg/ml is therapeutic. SCr is 0.85,  albumin =3.9.    Plan:  No drug interactions noted.   Continue to monitor AEDs and for drug interactions.   Noah Delaine, RPh Clinical Pharmacist Pager: [phone]  07/16/2013,9:01 PM

## 2013-07-16 NOTE — ED Provider Notes (Signed)
CSN: [id number]     Arrival date & time 07/16/13  1029 History   First MD Initiated Contact with Patient 07/16/13 1041     Chief Complaint  Patient presents with  . Seizures   (Consider location/radiation/quality/duration/timing/severity/associated sxs/prior Treatment) HPI Comments: Level 5 caveat applies secondary to patient's condition.  Patient is a 62 y/o male with a hx of alcoholism, stroke, dementia, and seizures who presents from home where he lives with his sister after ? seizure activity at 9:30AM today. Sister states that patient was sitting on couch when his arms flexed b/l and eyes gazed to the left. Patient became nonverbal at this time and has had some degree of expressive aphasia since. Patient has continued to be nonverbal. Sister denies tongue biting, tonic clonic jerking, and incontinence. She states patient has been compliant with current regimen of Keppra and Dilantin.  Neurologist - Dr. Stephanie Acre  The history is provided by a relative. The history is limited by the condition of the patient. No language interpreter was used.    Past Medical History  Diagnosis Date  . History of alcoholism   . Hx of ischemic vertebrobasilar artery thalamic stroke     Right  . Gait disorder   . Dementia   . 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 Seizures   . Memory disorder 03/04/2013  . Gait disorder   . Stroke     Right frontal, right thalamic  . Ulnar neuropathy of left upper extremity   . Alcoholism     History of, not active   Past Surgical History  Procedure Laterality Date  . Wrist surgery Left 95 & 96   Family History  Problem Relation Age of Onset  . Pulmonary embolism Mother   . Liver disease Father    History  Substance Use Topics  . Smoking status: Current Every Day Smoker -- 3.00 packs/day  . Smokeless tobacco: Not on file  . Alcohol Use: No     Comment: Former Alcoholic    Review of Systems  Constitutional: Negative for fever.  HENT:        No tongue biting  Gastrointestinal: Negative for nausea and vomiting.  Genitourinary:       No incontinence  Neurological: Positive for seizures and speech difficulty. Negative for syncope.  All other systems reviewed and are negative.   Allergies  Review of patient's allergies indicates no known allergies.  Home Medications   Current Outpatient Rx  Name  Route  Sig  Dispense  Refill  . aspirin 81 MG tablet   Oral   Take 81 mg by mouth daily.         . Cholecalciferol (VITAMIN D-3) 1000 UNITS CAPS   Oral   Take 1 capsule by mouth daily.         Marland Kitchen levETIRAcetam (KEPPRA) 500 MG tablet      1/2 tablet twice a day for one week, then take one tablet twice a day   60 tablet   5   . metoprolol succinate (TOPROL-XL) 25 MG 24 hr tablet   Oral   Take 25 mg by mouth daily.         . phenytoin (DILANTIN) 100 MG ER capsule   Oral   Take 100 mg by mouth 2 (two) times daily.          . simvastatin (ZOCOR) 20 MG tablet   Oral   Take 20 mg by mouth every evening.  BP 134/80  Pulse 75  Temp(Src) 100.2 F (37.9 C) (Oral)  Resp 19  SpO2 98%  Physical Exam  Nursing note and vitals reviewed. Constitutional: He appears well-developed and well-nourished.  Patient agitated and moving extremities actively in bed. Nonverbal, but grunting. Patient will follow some commands sporadically.  HENT:  Head: Normocephalic and atraumatic.  Mouth/Throat: Oropharynx is clear and moist.  Eyes: Conjunctivae and EOM are normal. Pupils are equal, round, and reactive to light. No scleral icterus.  Neck: Normal range of motion.  Cardiovascular: Normal rate, regular rhythm, normal heart sounds and intact distal pulses.   Pulmonary/Chest: Effort normal and breath sounds normal. No respiratory distress. He has no wheezes. He has no rales.  Abdominal: Soft. He exhibits no distension. There is no tenderness.  Musculoskeletal: Normal range of motion.  Neurological: He is alert. He  has normal strength. GCS eye subscore is 4. GCS verbal subscore is 3. GCS motor subscore is 5.  Normal strength in b/l extremities. Patient moving extremities without ataxia.  Skin: Skin is warm and dry. No rash noted. He is not diaphoretic. No erythema. No pallor.  Psychiatric: He is agitated. He is noncommunicative.    ED Course  Procedures (including critical care time) Labs Review Labs Reviewed  CBC WITH DIFFERENTIAL - Abnormal; Notable for the following:    Platelets 143 (*)    All other components within normal limits  BASIC METABOLIC PANEL - Abnormal; Notable for the following:    Sodium 134 (*)    All other components within normal limits  PHENYTOIN LEVEL, TOTAL  URINALYSIS, ROUTINE W REFLEX MICROSCOPIC   Imaging Review No results found.  MDM  No diagnosis found.  Patient is a 62 y/o male with a hx of ischemic stroke and seizures, currently on Keppra and Dilantin, who presents after questionable seizure activity this AM. Patient has had expressive aphasia since this event which has persisted throughout ED course. No obvious neurologic deficits, however patient has been very agitated and combative making it difficult to get a complete exam. Labs ordered as well as head CT for further work up.  Labs unremarkable and dilantin level therapeutic; will not need to load Keppra. Dr. Thad Ranger of Neurology made aware of patient case. CT head pending; first attempt unsuccessful 2/2 state of patient. Patient will require admission for further work up given continued expressive aphasia.  Patient signed out to oncoming provider, Dr. Deretha Emory, at shift change with CT pending. Plan admit to inpatient.   Antony Madura, PA-C 07/16/13 1607  Medical screening examination/treatment/procedure(s) were conducted as a shared visit with non-physician practitioner(s) or resident  and myself.  I personally evaluated the patient during the encounter and agree with the findings and plan unless otherwise  indicated.    Known seizure hx and stroke.  Pt with seizure earlier this am, no known head injury.  Pt has had expressive aphasia since.  Pt agitated in ED, verbal grunts and few words, moves all ext equal bilateral, perrl.  Difficulty with detailed neuro exam and obtaining head CT. Multiple ativan doses required.  No fever. Neck supple.  Neuro consulted. Plan for admission after CT head results. Signed out to fup results.  Filed Vitals:   07/16/13 1815 07/16/13 1919 07/16/13 2128 07/17/13 0550  BP:  154/71 131/76 131/76  Pulse:  52 57 57  Temp:  98.9 F (37.2 C) 98.7 F (37.1 C) 98.5 F (36.9 C)  TempSrc:  Axillary Oral Oral  Resp: 23 20 20 20   SpO2:  99% 97% 98%   Seizures, agitation, confusion  Enid Skeens, MD 07/18/13 [phone]

## 2013-07-16 NOTE — ED Notes (Signed)
Urine collected by in and out catheterization.

## 2013-07-16 NOTE — ED Notes (Signed)
Sister [phone]

## 2013-07-16 NOTE — ED Notes (Signed)
Patient to ct at this time

## 2013-07-16 NOTE — Consult Note (Signed)
NEURO HOSPITALIST CONSULT NOTE    Reason for Consult: seizure  HPI:                                                                                                                                          [name] is an 62 y.o. male history of seizures,right thalamic stroke in 2000, and chronic right frontal stroke as well.  Patient is followed out patient [facility] for his [facility]. Per [facility] note, his seizures have been difficult to control in the past. Per last office visit in may 2014, he had been seizure free for two years while on Dilantin and Depakote. Due to elevated Depakote and ammonia levels later in May his Depakote dose was reduced. Due to continued elevated Ammonia his Depakote was tapered to off over two weeks. Per notes on 05/12/13 Keppra was added to his Dilantin. Patient was doing well until this AM.  Wife states he went to lay on the couch and then he was not acting appropriately.  His arms became flexed and then he looked to the left with eyes deviated to the left. She states this lasted for "30 minutes". On arrival he was non communicative (per chart) At present time he is more alert, able to state he is in the hospital, follows some verbal and visual commands but remains confused.  Per wife, he has been taking all his medication daily (she gives his medication).   Current dilantin level is 11.3  Past Medical History  Diagnosis Date  . History of alcoholism   . Hx of ischemic vertebrobasilar artery thalamic stroke     Right  . Gait disorder   . Dementia   . 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 Seizures   . Memory disorder 03/04/2013  . Gait disorder   . Stroke     Right frontal, right thalamic  . Ulnar neuropathy of left upper extremity   . Alcoholism     History of, not active    Past Surgical History  Procedure Laterality Date  . Wrist surgery Left 95 & 96    Family History  Problem Relation Age of Onset  . Pulmonary  embolism Mother   . Liver disease Father     Social History:  reports that he has been smoking.  He does not have any smokeless tobacco history on file. He reports that he does not drink alcohol or use illicit drugs.  No Known Allergies  MEDICATIONS:  No current facility-administered medications for this encounter.   Current Outpatient Prescriptions  Medication Sig Dispense Refill  . aspirin 81 MG tablet Take 81 mg by mouth daily.      . Cholecalciferol (VITAMIN D-3) 1000 UNITS CAPS Take 1 capsule by mouth daily.      Marland Kitchen levETIRAcetam (KEPPRA) 500 MG tablet 1/2 tablet twice a day for one week, then take one tablet twice a day  60 tablet  5  . metoprolol succinate (TOPROL-XL) 25 MG 24 hr tablet Take 25 mg by mouth daily.      . phenytoin (DILANTIN) 100 MG ER capsule Take 100 mg by mouth 2 (two) times daily.       . simvastatin (ZOCOR) 20 MG tablet Take 20 mg by mouth every evening.          ROS:                                                                                                                                       History obtained from wife  General ROS: negative for - chills, fatigue, fever, night sweats, weight gain or weight loss Psychological ROS: negative for - behavioral disorder, hallucinations, memory difficulties, mood swings or suicidal ideation Ophthalmic ROS: negative for - blurry vision, double vision, eye pain or loss of vision ENT ROS: negative for - epistaxis, nasal discharge, oral lesions, sore throat, tinnitus or vertigo Allergy and Immunology ROS: negative for - hives or itchy/watery eyes Hematological and Lymphatic ROS: negative for - bleeding problems, bruising or swollen lymph nodes Endocrine ROS: negative for - galactorrhea, hair pattern changes, polydipsia/polyuria or temperature intolerance Respiratory ROS: negative for - cough,  hemoptysis, shortness of breath or wheezing Cardiovascular ROS: negative for - chest pain, dyspnea on exertion, edema or irregular heartbeat Gastrointestinal ROS: negative for - abdominal pain, diarrhea, hematemesis, nausea/vomiting or stool incontinence Genito-Urinary ROS: negative for - dysuria, hematuria, incontinence or urinary frequency/urgency Musculoskeletal ROS: negative for - joint swelling or muscular weakness Neurological ROS: as noted in HPI Dermatological ROS: negative for rash and skin lesion changes   Blood pressure 143/82, pulse 59, temperature 100.2 F (37.9 C), temperature source Oral, resp. rate 17, SpO2 100.00%.   Neurologic Examination:                                                                                                      Mental Status: Alert, oriented to hospital remains post ictal and obeys intermittent commands but not complex commands.  Speech fluent without evidence of aphasia.   Cranial Nerves: II: Discs flat bilaterally; Visual fields grossly normal, pupils equal, round, reactive to light and accommodation III,IV, VI: ptosis not present, extra-ocular motions intact bilaterally V,VII: smile symmetric, facial light touch sensation normal bilaterally VIII: hearing normal bilaterally IX,X: gag reflex present XI: bilateral shoulder shrug XII: midline tongue extension Motor: Right : Upper extremity   5/5    Left:     Upper extremity   4/5  Lower extremity   5/5     Lower extremity   5/5 Tone and bulk:normal tone throughout; no atrophy noted Sensory: Pinprick and light touch intact throughout, bilaterally Deep Tendon Reflexes:  Right: Upper Extremity   Left: Upper extremity   biceps (C-5 to C-6) 2/4   biceps (C-5 to C-6) 2/4 tricep (C7) 2/4    triceps (C7) 2/4 Brachioradialis (C6) 2/4  Brachioradialis (C6) 2/4  Lower Extremity Lower Extremity  quadriceps (L-2 to L-4) 1/4   quadriceps (L-2 to L-4) 1/4 Achilles (S1) 1/4   Achilles (S1)  1/4  Plantars: Right: downgoing   Left: downgoing Cerebellar: normal finger-to-nose,  normal heel-to-shin test Gait: not assessed CV: pulses palpable throughout    No components found with this basename: cbc,  bmp,  coags,  chol,  tri,  ldl,  hga1c    Results for orders placed during the hospital encounter of 07/16/13 (from the past 48 hour(s))  PHENYTOIN LEVEL, TOTAL     Status: None   Collection Time    07/16/13  1:53 PM      Result Value Range   Phenytoin Lvl 11.7  10.0 - 20.0 ug/mL  CBC WITH DIFFERENTIAL     Status: Abnormal   Collection Time    07/16/13  1:53 PM      Result Value Range   WBC 6.1  4.0 - 10.5 K/uL   RBC 4.47  4.22 - 5.81 MIL/uL   Hemoglobin 14.6  13.0 - 17.0 g/dL   HCT 21.3  08.6 - 57.8 %   MCV 91.7  78.0 - 100.0 fL   MCH 32.7  26.0 - 34.0 pg   MCHC 35.6  30.0 - 36.0 g/dL   RDW 46.9  62.9 - 52.8 %   Platelets 143 (*) 150 - 400 K/uL   Neutrophils Relative % 62  43 - 77 %   Neutro Abs 3.7  1.7 - 7.7 K/uL   Lymphocytes Relative 30  12 - 46 %   Lymphs Abs 1.8  0.7 - 4.0 K/uL   Monocytes Relative 8  3 - 12 %   Monocytes Absolute 0.5  0.1 - 1.0 K/uL   Eosinophils Relative 0  0 - 5 %   Eosinophils Absolute 0.0  0.0 - 0.7 K/uL   Basophils Relative 0  0 - 1 %   Basophils Absolute 0.0  0.0 - 0.1 K/uL  BASIC METABOLIC PANEL     Status: Abnormal   Collection Time    07/16/13  1:53 PM      Result Value Range   Sodium 134 (*) 135 - 145 mEq/L   Potassium 4.1  3.5 - 5.1 mEq/L   Chloride 98  96 - 112 mEq/L   CO2 26  19 - 32 mEq/L   Glucose, Bld 92  70 - 99 mg/dL   BUN 8  6 - 23 mg/dL   Creatinine, Ser 4.13  0.50 - 1.35 mg/dL   Calcium 9.4  8.4 - 24.4 mg/dL   GFR calc  non Af Amer >90  >90 mL/min   GFR calc Af Amer >90  >90 mL/min   Comment: (NOTE)     The eGFR has been calculated using the CKD EPI equation.     This calculation has not been validated in all clinical situations.     eGFR's persistently <90 mL/min signify possible Chronic Kidney      Disease.    Ct Head Wo Contrast  07/16/2013   CLINICAL DATA:  History of seizures.  EXAM: CT HEAD WITHOUT CONTRAST  TECHNIQUE: Contiguous axial images were obtained from the base of the skull through the vertex without intravenous contrast.  COMPARISON:  05/16/2010 study.  04/20/2011 study.  FINDINGS: There is no evidence of brain mass, brain hemorrhage, or acute infarction.  There is evidence of cerebral atrophy with prominence of cortical sulci and fissures. There is some cerebellar atrophic change as well. The ventricular system is enlarged consistent with atrophy. There is no evidence of obstructive hydrocephalus. There is no evidence of shift of midline structures, parenchymal lesion, or subdural or epidural hematoma.  There is chronic encephalomalacia and atrophic change in the right parietal region consistent with previous infarction.  Non aneurysmal vertebral artery calcifications and internal carotid calcifications are present.  The calvarium is intact. Mastoids are aerated on the left. There has been chronic opacification of the right mastoid air cells without evidence of bony destruction. . No sinusitis is evident. There is a right occipital scalp calcification unchanged. Calvarium is intact.  .  IMPRESSION: There is no evidence of brain mass, brain hemorrhage, or acute infarction. Chronic atrophic changes are described above.  No acute brain abnormality is identified.  There is chronic encephalomalacia and atrophic change in the right parietal region consistent with previous infarction.  Calvarium is intact. No sinusitis is evident. Chronic opacification of right mastoid air cells without evidence of bony destruction.   Electronically Signed   By: Onalee Hua  Call M.D.   On: 07/16/2013 16:42     Felicie Morn PA-C [facility] [phone]  07/16/2013, 4:51 PM   Patient seen and examined.  Clinical course and management discussed.  Necessary edits performed.  I agree with the above.   Assessment and plan of care developed and discussed below.    Assessment/Plan: 62 year old male with a history of stroke and seizure who presents today with a breakthrough seizure.  Patient has recently had some anticonvulsant changes secondary to side effects that consisted of LFT changes.  Patient is now on Keppra and Dilantin.  Dilantin level is therapeutic at 11.7.  CT of the head reviewed and shows no acute changes.    Recommendations: 1.. LFT's 2.  Keppra 1000mg  IV now 3.  Increase maintenance Keppra to 750mg  BID 4.  Seizure precautions 5.  Would only do further imaging if patient does not return to baseline within 24 hours.  Would only consider EEG if patient does not return to baseline as well.  6.  Continue Dilantin at current dose    Thana Farr, MD [facility] [phone]  07/16/2013  4:51 PM

## 2013-07-16 NOTE — ED Notes (Signed)
Pt arrived by [facility] from home. Family reports pt going to restroom, they heard noise and found pt on floor with jerking movements, seizure activity. Hx of seizures one year ago. Pt has been nonverbal and postictal since [facility]s arrival.

## 2013-07-16 NOTE — ED Notes (Signed)
Patient transported to CT 

## 2013-07-16 NOTE — H&P (Signed)
[facility] History and Physical  [name] ZOX:[id number] DOB: 1950-10-29 DOA: 07/16/2013  Referring physician:  PCP: Dorrene German, MD  Specialists:   Chief Complaint: seizure  HPI: [name]  62 y.o. male PMHx Lesion Ulnar Nerve, Memory DO, Seizures,Rt thalamic stroke in 2000,chronic right frontal stroke as well. Patient is [facility] for [facility]. Per [facility] note, his seizures have been difficult to control in the past. Per last office visit in may 2014, he had been seizure free for two years while on Dilantin and Depakote. Due to elevated Depakote and ammonia levels later in May his Depakote dose was reduced. Due to continued elevated Ammonia his Depakote was tapered to off over two weeks. Per notes on 05/12/13 Keppra was added to his Dilantin. Patient was doing well until this AM. Wife states he went to lay on the couch and then he was not acting appropriately. His arms became flexed and then he looked to the left with eyes deviated to the left. She states this lasted for "30 minutes". On arrival he was non communicative (per chart) At present time he is more alert, able to state he is in the hospital, follows some verbal and visual commands but remains confused. Per wife, he has been taking all his medication daily (she gives his medication). TODAY extremely sleepy but arousable will follow some commands    Review of Systems: Unable to complete secondary to patient's drowsiness  and breast masses.   Procedure Head CT w/o Contrast There is no evidence of brain mass, brain hemorrhage, or acute  infarction. Chronic atrophic changes are described above.  No acute brain abnormality is identified.  There is chronic encephalomalacia and atrophic change in the right  parietal region consistent with previous infarction.  Calvarium is intact. No sinusitis is evident. Chronic opacification  of right mastoid air cells without evidence of bony  destruction    Past Medical History  Diagnosis Date  . History of alcoholism   . Hx of ischemic vertebrobasilar artery thalamic stroke     Right  . Gait disorder   . Dementia   . 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 Seizures   . Memory disorder 03/04/2013  . Gait disorder   . Stroke     Right frontal, right thalamic  . Ulnar neuropathy of left upper extremity   . Alcoholism     History of, not active   Past Surgical History  Procedure Laterality Date  . Wrist surgery Left 95 & 96   Social History:  reports that he has been smoking.  He does not have any smokeless tobacco history on file. He reports that he does not drink alcohol or use illicit drugs.   No Known Allergies  Family History  Problem Relation Age of Onset  . Pulmonary embolism Mother   . Liver disease Father      Prior to Admission medications   Medication Sig Start Date End Date Taking? Authorizing Provider  aspirin 81 MG tablet Take 81 mg by mouth daily.   Yes Historical Provider, MD  Cholecalciferol (VITAMIN D-3) 1000 UNITS CAPS Take 1 capsule by mouth daily.   Yes Historical Provider, MD  levETIRAcetam (KEPPRA) 500 MG tablet 1/2 tablet twice a day for one week, then take one tablet twice a day 03/31/13  Yes York Spaniel, MD  metoprolol succinate (TOPROL-XL) 25 MG 24 hr tablet Take 25 mg by mouth daily.   Yes  Historical Provider, MD  phenytoin (DILANTIN) 100 MG ER capsule Take 100 mg by mouth 2 (two) times daily.    Yes Historical Provider, MD  simvastatin (ZOCOR) 20 MG tablet Take 20 mg by mouth every evening.   Yes Historical Provider, MD   Physical Exam: Filed Vitals:   07/16/13 1640 07/16/13 1644 07/16/13 1648 07/16/13 1652  BP:      Pulse: 59 56 56 53  Temp:      TempSrc:      Resp: 26 12 16 21   SpO2: 98% 99% 99% 98%     General:  A./O. x3 (did not no year), NAD  Eyes: Pupils were equal round reactive to light and  Cardiovascular: Regular rhythm and rate, negative  murmurs rubs gallops, PT/DP pulse 2+ bilateral  Respiratory: Clear to auscultation  Abdomen: Soft, nontender, nondistended plus bowel sounds  Skin: Within normal limits  Musculoskeletal: Negative pedal edema  Neurologic: Pupils equal round reactive to light and accommodation, cranial nerves II through XII grossly intact unable to complete neurologic exam secondary to patient's sleepiness and unwillingness to cooperate. Sleeping comfortably  Labs on Admission:  Basic Metabolic Panel:  Recent Labs Lab 07/16/13 1353  NA 134*  K 4.1  CL 98  CO2 26  GLUCOSE 92  BUN 8  CREATININE 0.86  CALCIUM 9.4   Liver Function Tests: No results found for this basename: AST, ALT, ALKPHOS, BILITOT, PROT, ALBUMIN,  in the last 168 hours No results found for this basename: LIPASE, AMYLASE,  in the last 168 hours No results found for this basename: AMMONIA,  in the last 168 hours CBC:  Recent Labs Lab 07/16/13 1353  WBC 6.1  NEUTROABS 3.7  HGB 14.6  HCT 41.0  MCV 91.7  PLT 143*   Cardiac Enzymes: No results found for this basename: CKTOTAL, CKMB, CKMBINDEX, TROPONINI,  in the last 168 hours  BNP (last 3 results) No results found for this basename: PROBNP,  in the last 8760 hours CBG: No results found for this basename: GLUCAP,  in the last 168 hours  Radiological Exams on Admission: Ct Head Wo Contrast  07/16/2013   CLINICAL DATA:  History of seizures.  EXAM: CT HEAD WITHOUT CONTRAST  TECHNIQUE: Contiguous axial images were obtained from the base of the skull through the vertex without intravenous contrast.  COMPARISON:  05/16/2010 study.  04/20/2011 study.  FINDINGS: There is no evidence of brain mass, brain hemorrhage, or acute infarction.  There is evidence of cerebral atrophy with prominence of cortical sulci and fissures. There is some cerebellar atrophic change as well. The ventricular system is enlarged consistent with atrophy. There is no evidence of obstructive hydrocephalus.  There is no evidence of shift of midline structures, parenchymal lesion, or subdural or epidural hematoma.  There is chronic encephalomalacia and atrophic change in the right parietal region consistent with previous infarction.  Non aneurysmal vertebral artery calcifications and internal carotid calcifications are present.  The calvarium is intact. Mastoids are aerated on the left. There has been chronic opacification of the right mastoid air cells without evidence of bony destruction. . No sinusitis is evident. There is a right occipital scalp calcification unchanged. Calvarium is intact.  .  IMPRESSION: There is no evidence of brain mass, brain hemorrhage, or acute infarction. Chronic atrophic changes are described above.  No acute brain abnormality is identified.  There is chronic encephalomalacia and atrophic change in the right parietal region consistent with previous infarction.  Calvarium is intact. No sinusitis is  evident. Chronic opacification of right mastoid air cells without evidence of bony destruction.   Electronically Signed   By: Onalee Hua  Call M.D.   On: 07/16/2013 16:42    EKG: None  Assessment/Plan Active Problems:   * No active hospital problems. *  1. Seizure; per neurology patient received. Keppra 1000mg  IV loading dose.  -Continue maintenance Keppra 750mg  BID  -Seizure precautions  -Per neurology only do further imaging if patient does not return to baseline within 24 hours. Would only consider EEG if patient does not return to baseline as well.  -Continue home dose Dilantin  2. memory disorder; unable to judge appropriately secondary to patient's lethargy  3. gait abnormality; unable to judge appropriately secondary to patient's lethargy  Code Status: Assumed  full Disposition Plan: Per neurology  Time spent: 40 minutes  Drema Dallas [facility] Pager [phone]  If 7PM-7AM, please contact night-coverage [email or web address] Password [facility] 07/16/2013, 5:33  PM

## 2013-07-17 DIAGNOSIS — R569 Unspecified convulsions: Secondary | ICD-10-CM | POA: Diagnosis not present

## 2013-07-17 DIAGNOSIS — F329 Major depressive disorder, single episode, unspecified: Secondary | ICD-10-CM

## 2013-07-17 LAB — COMPREHENSIVE METABOLIC PANEL
ALT: 11 U/L (ref 0–53)
Albumin: 3.9 g/dL (ref 3.5–5.2)
Alkaline Phosphatase: 148 U/L — ABNORMAL HIGH (ref 39–117)
CO2: 23 mEq/L (ref 19–32)
Calcium: 9.3 mg/dL (ref 8.4–10.5)
Chloride: 100 mEq/L (ref 96–112)
GFR calc Af Amer: >90 mL/min (ref >90)
GFR calc non Af Amer: >90 mL/min (ref >90)
Glucose, Bld: 76 mg/dL (ref 70–99)
Potassium: 4.2 mEq/L (ref 3.5–5.1)
Sodium: 136 mEq/L (ref 135–145)
Total Bilirubin: 0.5 mg/dL (ref 0.3–1.2)
Total Protein: 7.7 g/dL (ref 6.0–8.3)

## 2013-07-17 LAB — CBC
HCT: 41.7 % (ref 39.0–52.0)
MCH: 33.5 pg (ref 26.0–34.0)
MCHC: 36.9 g/dL — ABNORMAL HIGH (ref 30.0–36.0)
RDW: 12.5 % (ref 11.5–15.5)

## 2013-07-17 LAB — MAGNESIUM: Magnesium: 1.6 mg/dL (ref 1.5–2.5)

## 2013-07-17 MED ORDER — LEVETIRACETAM 750 MG PO TABS: 750.0000 mg | ORAL_TABLET | Freq: Two times a day (BID) | ORAL | Status: DC

## 2013-07-17 NOTE — Progress Notes (Signed)
Subjective: Patient awake and alert.  Seems back to baseline today.  No further seizures noted.  Objective: Current vital signs: BP 131/76  Pulse 57  Temp(Src) 98.5 F (36.9 C) (Oral)  Resp 20  SpO2 98% Vital signs in last 24 hours: Temp:  [98.5 F (36.9 C)-100.2 F (37.9 C)] 98.5 F (36.9 C) (09/26 0550) Pulse Rate:  [52-100] 57 (09/26 0550) Resp:  [12-26] 20 (09/26 0550) BP: (131-165)/(66-119) 131/76 mmHg (09/26 0550) SpO2:  [94 %-100 %] 98 % (09/26 0550)  Intake/Output from previous day:   Intake/Output this shift:   Nutritional status: Cardiac  Neurologic Exam: Mental Status:  Alert, oriented.  Speech fluent without evidence of aphasia.  Follows commands.  Cranial Nerves:  II: Discs flat bilaterally; Visual fields grossly normal, pupils equal, round, reactive to light and accommodation  III,IV, VI: ptosis not present, extra-ocular motions intact bilaterally  V,VII: smile symmetric, facial light touch sensation normal bilaterally  VIII: hearing normal bilaterally  IX,X: gag reflex present  XI: bilateral shoulder shrug  XII: midline tongue extension  Motor:  Right : Upper extremity 5/5          Left: Upper extremity 4/5   Lower extremity 5/5       Lower extremity 5/5  Tone and bulk:normal tone throughout; no atrophy noted  Sensory: Pinprick and light touch intact throughout, bilaterally  Deep Tendon Reflexes:  2+ in the upper extremities and 1+ in the lower extremities Plantars:  Right: downgoing    Left: downgoing   Lab Results: Basic Metabolic Panel:  Recent Labs Lab 07/16/13 1353  NA 134*  K 4.1  CL 98  CO2 26  GLUCOSE 92  BUN 8  CREATININE 0.86  CALCIUM 9.4    Liver Function Tests:  Recent Labs Lab 07/16/13 1353  AST 16  ALT 10  ALKPHOS 150*  BILITOT 0.4  PROT 7.8  ALBUMIN 3.9   No results found for this basename: LIPASE, AMYLASE,  in the last 168 hours No results found for this basename: AMMONIA,  in the last 168  hours  CBC:  Recent Labs Lab 07/16/13 1353  WBC 6.1  NEUTROABS 3.7  HGB 14.6  HCT 41.0  MCV 91.7  PLT 143*    Cardiac Enzymes: No results found for this basename: CKTOTAL, CKMB, CKMBINDEX, TROPONINI,  in the last 168 hours  Lipid Panel: No results found for this basename: CHOL, TRIG, HDL, CHOLHDL, VLDL, LDLCALC,  in the last 168 hours  CBG: No results found for this basename: GLUCAP,  in the last 168 hours  Microbiology: Results for orders placed during the hospital encounter of 03/11/09  CULTURE, BLOOD (ROUTINE X 2)     Status: None   Collection Time    03/11/09  4:45 PM      Result Value Range Status   Specimen Description BLOOD RIGHT ARM   Final   Special Requests BOTTLES DRAWN AEROBIC ONLY 10CC   Final   Culture NO GROWTH 5 DAYS   Final   Report Status 03/17/2009 FINAL   Final  CULTURE, BLOOD (ROUTINE X 2)     Status: None   Collection Time    03/11/09  5:00 PM      Result Value Range Status   Specimen Description BLOOD RIGHT HAND   Final   Special Requests BOTTLES DRAWN AEROBIC ONLY 10CC   Final   Culture NO GROWTH 5 DAYS   Final   Report Status 03/17/2009 FINAL   Final  Coagulation Studies: No results found for this basename: LABPROT, INR,  in the last 72 hours  Imaging: Ct Head Wo Contrast  07/16/2013   CLINICAL DATA:  History of seizures.  EXAM: CT HEAD WITHOUT CONTRAST  TECHNIQUE: Contiguous axial images were obtained from the base of the skull through the vertex without intravenous contrast.  COMPARISON:  05/16/2010 study.  04/20/2011 study.  FINDINGS: There is no evidence of brain mass, brain hemorrhage, or acute infarction.  There is evidence of cerebral atrophy with prominence of cortical sulci and fissures. There is some cerebellar atrophic change as well. The ventricular system is enlarged consistent with atrophy. There is no evidence of obstructive hydrocephalus. There is no evidence of shift of midline structures, parenchymal lesion, or subdural or  epidural hematoma.  There is chronic encephalomalacia and atrophic change in the right parietal region consistent with previous infarction.  Non aneurysmal vertebral artery calcifications and internal carotid calcifications are present.  The calvarium is intact. Mastoids are aerated on the left. There has been chronic opacification of the right mastoid air cells without evidence of bony destruction. . No sinusitis is evident. There is a right occipital scalp calcification unchanged. Calvarium is intact.  .  IMPRESSION: There is no evidence of brain mass, brain hemorrhage, or acute infarction. Chronic atrophic changes are described above.  No acute brain abnormality is identified.  There is chronic encephalomalacia and atrophic change in the right parietal region consistent with previous infarction.  Calvarium is intact. No sinusitis is evident. Chronic opacification of right mastoid air cells without evidence of bony destruction.   Electronically Signed   By: Onalee Hua  Call M.D.   On: 07/16/2013 16:42    Medications:  I have reviewed the patient's current medications. Scheduled: . aspirin EC  81 mg Oral Daily  . levETIRAcetam  750 mg Oral BID  . metoprolol succinate  25 mg Oral Daily  . phenytoin  100 mg Oral BID  . simvastatin  20 mg Oral QPM    Assessment/Plan: Patient appears to be at baseline.  No further seizures.  Tolerating increase in Keppra without noted side effects.  Recommendations: 1.  Patient to be followed up by outpatient physicians at discharge.  To remain on Keppra at 750mg  BID   LOS: 1 day   Thana Farr, MD [facility] [phone] 07/17/2013  8:56 AM

## 2013-07-17 NOTE — Discharge Summary (Signed)
Physician Discharge Summary  [name] ZOX:[id number] DOB: [birth year] DOA: 07/16/2013  PCP: Dorrene German, MD  Admit date: 07/16/2013 Discharge date: 07/17/2013  Time spent: 40 minutes  Recommendations for Outpatient Follow-up:  1. Follow up with primary MD. 2. Follow up with primary neurologist.   Discharge Diagnoses:  Active Problems:   * No active hospital problems. *   Discharge Condition: Satisfactory.   Diet recommendation: Heart-Healthy.  There were no vitals filed for this visit.  History of present illness:  62 y.o. male with history of LUE ulnar neuropathy, Memory DO/Dementia, Seizures, Rt thalamic stroke in 2000, chronic right frontal stroke as well. Patient is followed out patient by [facility] for his seizures. Per [facility] note, his seizures have been difficult to control in the past. Per last office visit in may 2014, he had been seizure free for two years while on Dilantin and Depakote. Due to elevated Depakote and ammonia levels later in May his Depakote dose was reduced. Due to continued elevated Ammonia his Depakote was tapered to off over two weeks. Per notes on 05/12/13 Keppra was added to his Dilantin. Patient was doing well until AM of 07/16/13. Wife states he went to lay on the couch and then, he was not acting appropriately. His arms became flexed and then he looked to the left with eyes deviated to the left. She states this lasted for "30 minutes". On arrival he was non communicative (per chart), although at the time of admitting MD evaluation, he was more alert, able to state he is in the hospital, followed some verbal and visual commands, but remained confused. Per wife, he has been taking all his medication daily (she gives his medication). Extremely sleepy but arousable and following some commands. Admitted for further management.    Hospital Course:  1. Seizure: Patient with known history of difficult-to-control seizures, and recent anticonvulsant medication  changes/adjustments, presenting with yet another witnessed seizure episode, and no apparent obvious precipitants, despite medication compliance. Dr Thana Farr provided neurology consultation, patient was administered Keppra 1000mg  IV loading dose, followed by maintenance Keppra 750mg  BID, as well as continued pre-admission Dilantin. Clinical response was rather dramatic. No further seizures were recorded during hospitalization, ans as of AM of 07/17/13, patient was back to baseline, and cleared by neurology, for discharge.  2. Memory disorder: Appears stable. Patient was transiently lethargic, secondary to #1, but this has resolved.   Procedures:  N/A.   Consultations:  Dr Thana Farr, neurologist.  Discharge Exam: Filed Vitals:   07/17/13 0550  BP: 131/76  Pulse: 57  Temp: 98.5 F (36.9 C)  Resp: 20    General: Comfortable, alert, communicative, not short of breath at rest.  HEENT: No clinical pallor, no jaundice, no conjunctival injection or discharge. Hydration is satisfactory.  NECK: Supple, JVP not seen, no carotid bruits, no palpable lymphadenopathy, no palpable goiter.  CHEST: Clinically clear to auscultation, no wheezes, no crackles.  HEART: Sounds 1 and 2 heard, normal, regular, no murmurs.  ABDOMEN: Full, soft, non-tender, no palpable organomegaly, no palpable masses, normal bowel sounds.  GENITALIA: Not examined.  LOWER EXTREMITIES: No pitting edema, palpable peripheral pulses.  MUSCULOSKELETAL SYSTEM: Generalized osteoarthritic changes, otherwise, normal.  CENTRAL NERVOUS SYSTEM: No focal neurologic deficit on gross examination.  Discharge Instructions      Discharge Orders   Future Appointments Provider Department Dept Phone   03/04/2014 11:00 AM York Spaniel, MD [facility] [phone]   Future Orders Complete By Expires   Diet -  low sodium heart healthy  As directed    Increase activity slowly  As directed        Medication  List         aspirin 81 MG tablet  Take 81 mg by mouth daily.     levETIRAcetam 750 MG tablet  Commonly known as:  KEPPRA  Take 1 tablet (750 mg total) by mouth 2 (two) times daily.     metoprolol succinate 25 MG 24 hr tablet  Commonly known as:  TOPROL-XL  Take 25 mg by mouth daily.     phenytoin 100 MG ER capsule  Commonly known as:  DILANTIN  Take 100 mg by mouth 2 (two) times daily.     simvastatin 20 MG tablet  Commonly known as:  ZOCOR  Take 20 mg by mouth every evening.     Vitamin D-3 1000 UNITS Caps  Take 1 capsule by mouth daily.       No Known Allergies Follow-up Information   Follow up with Dorrene German, MD.   Specialty:  Internal Medicine   Contact information:   [address] [place] [address] [phone]       Call to follow up. (Follow up with primary neurologist. )        The results of significant diagnostics from this hospitalization (including imaging, microbiology, ancillary and laboratory) are listed below for reference.    Significant Diagnostic Studies: Ct Head Wo Contrast  07/16/2013   CLINICAL DATA:  History of seizures.  EXAM: CT HEAD WITHOUT CONTRAST  TECHNIQUE: Contiguous axial images were obtained from the base of the skull through the vertex without intravenous contrast.  COMPARISON:  05/16/2010 study.  04/20/2011 study.  FINDINGS: There is no evidence of brain mass, brain hemorrhage, or acute infarction.  There is evidence of cerebral atrophy with prominence of cortical sulci and fissures. There is some cerebellar atrophic change as well. The ventricular system is enlarged consistent with atrophy. There is no evidence of obstructive hydrocephalus. There is no evidence of shift of midline structures, parenchymal lesion, or subdural or epidural hematoma.  There is chronic encephalomalacia and atrophic change in the right parietal region consistent with previous infarction.  Non aneurysmal vertebral artery calcifications and  internal carotid calcifications are present.  The calvarium is intact. Mastoids are aerated on the left. There has been chronic opacification of the right mastoid air cells without evidence of bony destruction. . No sinusitis is evident. There is a right occipital scalp calcification unchanged. Calvarium is intact.  .  IMPRESSION: There is no evidence of brain mass, brain hemorrhage, or acute infarction. Chronic atrophic changes are described above.  No acute brain abnormality is identified.  There is chronic encephalomalacia and atrophic change in the right parietal region consistent with previous infarction.  Calvarium is intact. No sinusitis is evident. Chronic opacification of right mastoid air cells without evidence of bony destruction.   Electronically Signed   By: Onalee Hua  Call M.D.   On: 07/16/2013 16:42    Microbiology: No results found for this or any previous visit (from the past 240 hour(s)).   Labs: Basic Metabolic Panel:  Recent Labs Lab 07/16/13 1353 07/17/13 0931  NA 134* 136  K 4.1 4.2  CL 98 100  CO2 26 23  GLUCOSE 92 76  BUN 8 10  CREATININE 0.86 0.76  CALCIUM 9.4 9.3  MG  --  1.6   Liver Function Tests:  Recent Labs Lab 07/16/13 1353 07/17/13 0931  AST 16  19  ALT 10 11  ALKPHOS 150* 148*  BILITOT 0.4 0.5  PROT 7.8 7.7  ALBUMIN 3.9 3.9   No results found for this basename: LIPASE, AMYLASE,  in the last 168 hours No results found for this basename: AMMONIA,  in the last 168 hours CBC:  Recent Labs Lab 07/16/13 1353 07/17/13 0931  WBC 6.1 6.1  NEUTROABS 3.7  --   HGB 14.6 15.4  HCT 41.0 41.7  MCV 91.7 90.7  PLT 143* 134*   Cardiac Enzymes: No results found for this basename: CKTOTAL, CKMB, CKMBINDEX, TROPONINI,  in the last 168 hours BNP: BNP (last 3 results) No results found for this basename: PROBNP,  in the last 8760 hours CBG: No results found for this basename: GLUCAP,  in the last 168 hours     Signed:  Vincentina Sollers,CHRISTOPHER  [facility] 07/17/2013, 12:27 PM

## 2013-07-17 NOTE — Progress Notes (Signed)
UR completed 

## 2013-07-17 NOTE — Progress Notes (Signed)
Pt is ready to d/c home, gave all d/c educations and paper work to his sister. Pt lives with his sister. Pt still looks little disoriented, sister mentioned he is in his base line. IV removed,  Rolled him down in a wheelchair.

## 2013-07-21 ENCOUNTER — Telehealth: Payer: Self-pay | Admitting: Neurology

## 2013-07-22 NOTE — Telephone Encounter (Signed)
How soon would you like to see this patient for hospital f/u. He was discharged on 9/26 epilepsy.

## 2013-07-22 NOTE — Telephone Encounter (Signed)
This patient was recently in the hospital with hyperammonemia associated with Depakote. The Depakote was taken off, and Keppra was added. The patient can be seen sometime within the next 3 or 4 weeks. Okay for NP to see.

## 2013-07-22 NOTE — Telephone Encounter (Signed)
Patient is scheduled for follow up with Larita Fife on 08-10-13.

## 2013-08-10 ENCOUNTER — Ambulatory Visit: Payer: Self-pay | Admitting: Nurse Practitioner

## 2013-08-11 ENCOUNTER — Ambulatory Visit (INDEPENDENT_AMBULATORY_CARE_PROVIDER_SITE_OTHER): Payer: Medicare Other | Admitting: Nurse Practitioner

## 2013-08-11 ENCOUNTER — Encounter: Payer: Self-pay | Admitting: Nurse Practitioner

## 2013-08-11 VITALS — BP 140/79 | HR 70 | Temp 98.1°F | Ht 69.0 in | Wt 154.0 lb

## 2013-08-11 DIAGNOSIS — R413 Other amnesia: Secondary | ICD-10-CM

## 2013-08-11 DIAGNOSIS — Z79899 Other long term (current) drug therapy: Secondary | ICD-10-CM

## 2013-08-11 DIAGNOSIS — G40209 Localization-related (focal) (partial) symptomatic epilepsy and epileptic syndromes with complex partial seizures, not intractable, without status epilepticus: Secondary | ICD-10-CM

## 2013-08-11 MED ORDER — PHENYTOIN SODIUM EXTENDED 100 MG PO CAPS: 100.0000 mg | ORAL_CAPSULE | Freq: Two times a day (BID) | ORAL | Status: DC

## 2013-08-11 MED ORDER — LEVETIRACETAM 750 MG PO TABS: 750.0000 mg | ORAL_TABLET | Freq: Two times a day (BID) | ORAL | Status: DC

## 2013-08-11 NOTE — Progress Notes (Signed)
I have read the note, and I agree with the clinical assessment and plan.  WILLIS,CHARLES KEITH   

## 2013-08-11 NOTE — Progress Notes (Signed)
[facility]  PATIENT: [name] DOB: 1950/11/27   REASON FOR VISIT: follow up HISTORY FROM: patient  HISTORY OF PRESENT ILLNESS: [name] is a 62 year old left-handed black male with a history of seizures and cerebrovascular disease. The patient has sustained a right thalamic stroke in 2000, and he has had a chronic right frontal stroke as well. The patient began having seizures that had initially been quite difficult to control. The patient has been on Dilantin and Depakote, and he has not had a seizure in over 2 years. The patient has had a memory disorder since the stroke, and he does not operate a motor vehicle. The patient has a chronic gait disorder, but he has not fallen recently. The patient returns for an evaluation. No other new medical issues have come up since he was here last in the fall of 2013. The patient is tolerating the medications well.  UPDATE 08/11/13 (LL):  [name] comes in for follow up of recent hospitalization for seizure.  He was noted to have increasing ammonia earlier this year and was being switched to Keppra while Depakote was being titrated off.  PTA he is taking phenytoin ER 100 mg po BID and keppra 500 mg po BID when sent to ER for seizure on 07/16/13.  He received IV keppra 1000 mg IV x 1 in the ED and the neurologist increased the oral keppra to 750mg  po BID. Dilantin has also been continued at home dose. LFTs are within normal and phenytoin level of 11.7 mcg/ml is therapeutic. SCr is 0.85.  He is doing well since d/c, no seizures.  Sister reports that he is not sleeping as well as when he was on Depakote, he wakes frequently now through the night. His sister monitors his medications.  REVIEW OF SYSTEMS: Full 14 system review of systems performed and notable only for:  Sleep: sleepiness  ALLERGIES: No Known Allergies  HOME MEDICATIONS: Outpatient Prescriptions Prior to Visit  Medication Sig Dispense Refill  . aspirin 81 MG tablet  Take 81 mg by mouth daily.      . Cholecalciferol (VITAMIN D-3) 1000 UNITS CAPS Take 1 capsule by mouth daily.      . metoprolol succinate (TOPROL-XL) 25 MG 24 hr tablet Take 25 mg by mouth daily.      . simvastatin (ZOCOR) 20 MG tablet Take 20 mg by mouth every evening.      . levETIRAcetam (KEPPRA) 750 MG tablet Take 1 tablet (750 mg total) by mouth 2 (two) times daily.  60 tablet  1  . phenytoin (DILANTIN) 100 MG ER capsule Take 100 mg by mouth 2 (two) times daily.        No facility-administered medications prior to visit.    PAST MEDICAL HISTORY: Past Medical History  Diagnosis Date  . History of alcoholism   . Hx of ischemic vertebrobasilar artery thalamic stroke     Right  . Gait disorder   . Dementia   . 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 Seizures   . Memory disorder 03/04/2013  . Gait disorder   . Stroke     Right frontal, right thalamic  . Ulnar neuropathy of left upper extremity   . Alcoholism     History of, not active    PAST SURGICAL HISTORY: Past Surgical History  Procedure Laterality Date  . Wrist surgery Left 95 & 96    FAMILY HISTORY: Family History  Problem Relation Age of Onset  .  Pulmonary embolism Mother   . Liver disease Father     SOCIAL HISTORY: History   Social History  . Marital Status: Divorced    Spouse Name: N/A    Number of Children: 0  . Years of Education: 10   Occupational History  . Not on file.   Social History Main Topics  . Smoking status: Current Every Day Smoker -- 3.00 packs/day  . Smokeless tobacco: Not on file  . Alcohol Use: No     Comment: Former Alcoholic  . Drug Use: No  . Sexual Activity: Yes   Other Topics Concern  . Not on file   Social History Narrative  . No narrative on file     PHYSICAL EXAM  Filed Vitals:   08/11/13 1104  BP: 140/79  Pulse: 70  Temp: 98.1 F (36.7 C)  TempSrc: Oral  Height: 5\' 9"  (1.753 m)  Weight: 154 lb (69.854 kg)   Body mass index is 22.73  kg/(m^2).  Generalized: Well developed, in no acute distress  Head: normocephalic and atraumatic. Oropharynx benign  Neck: Supple, no carotid bruits  Cardiac: Regular rate rhythm, no murmur  Musculoskeletal: No deformity   Neurological examination  Mentation: Alert oriented to time, place, history taking. Follows all commands speech and language fluent. The (Last visit: Mini-Mental status examination done today shows a total score of 28/30. The patient is able to name 8 animals in 60 seconds.)  Cranial nerve II-XII:  Pupils were equal round reactive to light extraocular movements were full, visual field were full on confrontational test. Facial sensation and strength were normal. hearing was intact to finger rubbing bilaterally. Uvula tongue midline. head turning and shoulder shrug and were normal and symmetric.Tongue protrusion into cheek strength was normal. Motor: normal bulk and tone, Upper extremity 4/5 ( due to old laceration injury) fine finger movements decreased on the left, no pronator drift.  Sensory: normal and symmetric to light touch Coordination: finger-nose-finger difficult on left, The patient has severe apraxia with heel-to-shin bilaterally. Reflexes: Deep tendon reflexes are symmetric.  Gait and Station: The patient has a wide-based gait, slightly unsteady. Tandem gait is very apraxic, unsteady. Romberg is positive, the patient tends to fall to the right. No drift is seen.   DIAGNOSTIC DATA (LABS, IMAGING, TESTING) - I reviewed patient records, labs, notes, testing and imaging myself where available.  Lab Results  Component Value Date   WBC 6.1 07/17/2013   HGB 15.4 07/17/2013   HCT 41.7 07/17/2013   MCV 90.7 07/17/2013   PLT 134* 07/17/2013      Component Value Date/Time   NA 136 07/17/2013 0931   NA 140 03/04/2013 1305   K 4.2 07/17/2013 0931   CL 100 07/17/2013 0931   CO2 23 07/17/2013 0931   GLUCOSE 76 07/17/2013 0931   GLUCOSE 68 03/04/2013 1305   BUN 10 07/17/2013 0931    BUN 16 03/04/2013 1305   CREATININE 0.76 07/17/2013 0931   CALCIUM 9.3 07/17/2013 0931   PROT 7.7 07/17/2013 0931   PROT 7.4 03/04/2013 1305   ALBUMIN 3.9 07/17/2013 0931   AST 19 07/17/2013 0931   ALT 11 07/17/2013 0931   ALKPHOS 148* 07/17/2013 0931   BILITOT 0.5 07/17/2013 0931   GFRNONAA >90 07/17/2013 0931   GFRAA >90 07/17/2013 0931   Ct Head Wo Contrast 07/16/2013  There is no evidence of brain mass, brain hemorrhage, or acute infarction. Chronic atrophic changes are described above. No acute brain abnormality is identified. There is  chronic encephalomalacia and atrophic change in the right parietal region consistent with previous infarction. Calvarium is intact. No sinusitis is evident. Chronic opacification of right mastoid air cells without evidence of bony destruction.   ASSESSMENT AND PLAN 62 y.o. year old male  has a past medical history of History of alcoholism; ischemic vertebrobasilar artery thalamic stroke; Gait disorder; Dementia; Gingival hypertrophy; Seizures; Memory disorder (03/04/2013); Gait disorder; Stroke; Ulnar neuropathy of left upper extremity; and Alcoholism here for follow up of recent seizure.  1. History seizures  2. Cerebrovascular disease  3. Gait disorder  4. Memory disorder   PLAN: Continue Keppra 750 mg BID Continue Dilantin ER 100 mg BID. Continue  aspirin at 81 mg daily. Recommended follow up with PCP for sleep disturbance.The patient does not operate a motor vehicle. The memory issues will be followed over time. Keep scheduled May appt. With Dr. Anne Hahn.  Meds ordered this encounter  Medications  . levETIRAcetam (KEPPRA) 750 MG tablet    Sig: Take 1 tablet (750 mg total) by mouth 2 (two) times daily.    Dispense:  60 tablet    Refill:  3    Order Specific Question:  Supervising Provider    Answer:  Stephanie Acre K [[id number]]  . phenytoin (DILANTIN) 100 MG ER capsule    Sig: Take 1 capsule (100 mg total) by mouth 2 (two) times daily.    Dispense:   180 capsule    Refill:  1    Order Specific Question:  Supervising Provider    Answer:  York Spaniel [[id number]]   Tawny Asal Elani Delph, MSN, NP-C 08/11/2013, 12:15 PM [facility] [address], [address] [place], [place] [address] [phone]

## 2013-08-11 NOTE — Patient Instructions (Signed)
Continue Keppra and Dilantin at current doses.  Follow up in 6 months, call office if seizures occur.

## 2013-11-26 ENCOUNTER — Emergency Department (HOSPITAL_COMMUNITY): Payer: Medicare Other

## 2013-11-26 ENCOUNTER — Encounter (HOSPITAL_COMMUNITY): Payer: Medicare Other | Admitting: Anesthesiology

## 2013-11-26 ENCOUNTER — Inpatient Hospital Stay (HOSPITAL_COMMUNITY): Payer: Medicare Other | Admitting: Anesthesiology

## 2013-11-26 ENCOUNTER — Inpatient Hospital Stay (HOSPITAL_COMMUNITY): Payer: Medicare Other

## 2013-11-26 ENCOUNTER — Encounter (HOSPITAL_COMMUNITY): Payer: Self-pay | Admitting: Emergency Medicine

## 2013-11-26 ENCOUNTER — Inpatient Hospital Stay (HOSPITAL_COMMUNITY)
Admission: EM | Admit: 2013-11-26 | Discharge: 2013-12-01 | DRG: 469 | Disposition: A | Discharge status: Skilled Nursing Facility | Payer: Medicare Other | Attending: Internal Medicine | Admitting: Internal Medicine

## 2013-11-26 ENCOUNTER — Encounter (HOSPITAL_COMMUNITY)
Admission: EM | Disposition: A | Discharge status: Skilled Nursing Facility | Payer: Self-pay | Source: Home / Self Care | Attending: Internal Medicine

## 2013-11-26 DIAGNOSIS — Z79899 Other long term (current) drug therapy: Secondary | ICD-10-CM

## 2013-11-26 DIAGNOSIS — F17200 Nicotine dependence, unspecified, uncomplicated: Secondary | ICD-10-CM | POA: Diagnosis present

## 2013-11-26 DIAGNOSIS — I6330 Cerebral infarction due to thrombosis of unspecified cerebral artery: Secondary | ICD-10-CM

## 2013-11-26 DIAGNOSIS — Z7982 Long term (current) use of aspirin: Secondary | ICD-10-CM

## 2013-11-26 DIAGNOSIS — R413 Other amnesia: Secondary | ICD-10-CM

## 2013-11-26 DIAGNOSIS — J449 Chronic obstructive pulmonary disease, unspecified: Secondary | ICD-10-CM | POA: Diagnosis present

## 2013-11-26 DIAGNOSIS — J4489 Other specified chronic obstructive pulmonary disease: Secondary | ICD-10-CM | POA: Diagnosis present

## 2013-11-26 DIAGNOSIS — F3289 Other specified depressive episodes: Secondary | ICD-10-CM

## 2013-11-26 DIAGNOSIS — S72033A Displaced midcervical fracture of unspecified femur, initial encounter for closed fracture: Principal | ICD-10-CM | POA: Diagnosis present

## 2013-11-26 DIAGNOSIS — G5620 Lesion of ulnar nerve, unspecified upper limb: Secondary | ICD-10-CM

## 2013-11-26 DIAGNOSIS — Z8673 Personal history of transient ischemic attack (TIA), and cerebral infarction without residual deficits: Secondary | ICD-10-CM

## 2013-11-26 DIAGNOSIS — J189 Pneumonia, unspecified organism: Secondary | ICD-10-CM | POA: Diagnosis not present

## 2013-11-26 DIAGNOSIS — F09 Unspecified mental disorder due to known physiological condition: Secondary | ICD-10-CM

## 2013-11-26 DIAGNOSIS — G40909 Epilepsy, unspecified, not intractable, without status epilepticus: Secondary | ICD-10-CM | POA: Diagnosis present

## 2013-11-26 DIAGNOSIS — F068 Other specified mental disorders due to known physiological condition: Secondary | ICD-10-CM

## 2013-11-26 DIAGNOSIS — F1021 Alcohol dependence, in remission: Secondary | ICD-10-CM | POA: Diagnosis present

## 2013-11-26 DIAGNOSIS — F0390 Unspecified dementia without behavioral disturbance: Secondary | ICD-10-CM | POA: Diagnosis present

## 2013-11-26 DIAGNOSIS — G40209 Localization-related (focal) (partial) symptomatic epilepsy and epileptic syndromes with complex partial seizures, not intractable, without status epilepticus: Secondary | ICD-10-CM

## 2013-11-26 DIAGNOSIS — R569 Unspecified convulsions: Secondary | ICD-10-CM

## 2013-11-26 DIAGNOSIS — R269 Unspecified abnormalities of gait and mobility: Secondary | ICD-10-CM | POA: Diagnosis present

## 2013-11-26 DIAGNOSIS — I519 Heart disease, unspecified: Secondary | ICD-10-CM | POA: Diagnosis not present

## 2013-11-26 DIAGNOSIS — I10 Essential (primary) hypertension: Secondary | ICD-10-CM | POA: Diagnosis present

## 2013-11-26 DIAGNOSIS — F329 Major depressive disorder, single episode, unspecified: Secondary | ICD-10-CM

## 2013-11-26 DIAGNOSIS — S72009A Fracture of unspecified part of neck of unspecified femur, initial encounter for closed fracture: Secondary | ICD-10-CM

## 2013-11-26 HISTORY — PX: HIP ARTHROPLASTY: SHX981

## 2013-11-26 LAB — PROTIME-INR
INR: 1.09 (ref 0.00–1.49)
PROTHROMBIN TIME: 13.9 s (ref 11.6–15.2)

## 2013-11-26 LAB — CBC WITH DIFFERENTIAL/PLATELET
BASOS PCT: 0 % (ref 0–1)
Basophils Absolute: 0 10*3/uL (ref 0.0–0.1)
Eosinophils Absolute: 0 10*3/uL (ref 0.0–0.7)
Eosinophils Relative: 0 % (ref 0–5)
HEMATOCRIT: 38.8 % — AB (ref 39.0–52.0)
HEMOGLOBIN: 13.8 g/dL (ref 13.0–17.0)
LYMPHS ABS: 1.3 10*3/uL (ref 0.7–4.0)
Lymphocytes Relative: 13 % (ref 12–46)
MCH: 32.7 pg (ref 26.0–34.0)
MCHC: 35.6 g/dL (ref 30.0–36.0)
MCV: 91.9 fL (ref 78.0–100.0)
MONO ABS: 0.7 10*3/uL (ref 0.1–1.0)
MONOS PCT: 7 % (ref 3–12)
NEUTROS PCT: 80 % — AB (ref 43–77)
Neutro Abs: 7.9 10*3/uL — ABNORMAL HIGH (ref 1.7–7.7)
Platelets: 145 10*3/uL — ABNORMAL LOW (ref 150–400)
RBC: 4.22 MIL/uL (ref 4.22–5.81)
RDW: 12.4 % (ref 11.5–15.5)
WBC: 9.9 10*3/uL (ref 4.0–10.5)

## 2013-11-26 LAB — COMPREHENSIVE METABOLIC PANEL
ALBUMIN: 3.8 g/dL (ref 3.5–5.2)
ALT: 17 U/L (ref 0–53)
AST: 17 U/L (ref 0–37)
Alkaline Phosphatase: 146 U/L — ABNORMAL HIGH (ref 39–117)
BILIRUBIN TOTAL: 0.8 mg/dL (ref 0.3–1.2)
BUN: 11 mg/dL (ref 6–23)
CHLORIDE: 106 meq/L (ref 96–112)
CO2: 23 mEq/L (ref 19–32)
CREATININE: 0.9 mg/dL (ref 0.50–1.35)
Calcium: 9.4 mg/dL (ref 8.4–10.5)
GFR calc non Af Amer: >90 mL/min (ref >90)
GLUCOSE: 105 mg/dL — AB (ref 70–99)
POTASSIUM: 4.2 meq/L (ref 3.7–5.3)
Sodium: 143 mEq/L (ref 137–147)
Total Protein: 7.5 g/dL (ref 6.0–8.3)

## 2013-11-26 LAB — URINALYSIS, ROUTINE W REFLEX MICROSCOPIC
BILIRUBIN URINE: NEGATIVE
Glucose, UA: NEGATIVE mg/dL
HGB URINE DIPSTICK: NEGATIVE
Ketones, ur: NEGATIVE mg/dL
Leukocytes, UA: NEGATIVE
Nitrite: NEGATIVE
Protein, ur: NEGATIVE mg/dL
Specific Gravity, Urine: 1.014 (ref 1.005–1.030)
UROBILINOGEN UA: 1 mg/dL (ref 0.0–1.0)
pH: 7.5 (ref 5.0–8.0)

## 2013-11-26 LAB — POCT I-STAT, CHEM 8
BUN: 11 mg/dL (ref 6–23)
Calcium, Ion: 1.2 mmol/L (ref 1.13–1.30)
Chloride: 104 mEq/L (ref 96–112)
Creatinine, Ser: 0.9 mg/dL (ref 0.50–1.35)
Glucose, Bld: 109 mg/dL — ABNORMAL HIGH (ref 70–99)
HEMATOCRIT: 44 % (ref 39.0–52.0)
HEMOGLOBIN: 15 g/dL (ref 13.0–17.0)
POTASSIUM: 3.9 meq/L (ref 3.7–5.3)
Sodium: 141 mEq/L (ref 137–147)
TCO2: 24 mmol/L (ref 0–100)

## 2013-11-26 LAB — SAMPLE TO BLOOD BANK

## 2013-11-26 LAB — TROPONIN I

## 2013-11-26 LAB — APTT: aPTT: 28 seconds (ref 24–37)

## 2013-11-26 LAB — PRO B NATRIURETIC PEPTIDE: Pro B Natriuretic peptide (BNP): 122.9 pg/mL (ref 0–125)

## 2013-11-26 SURGERY — HEMIARTHROPLASTY, HIP, DIRECT ANTERIOR APPROACH, FOR FRACTURE
Anesthesia: General | Site: Hip | Laterality: Left

## 2013-11-26 MED ORDER — LIDOCAINE HCL (CARDIAC) 20 MG/ML IV SOLN
INTRAVENOUS | Status: DC | PRN
  Administered 2013-11-26: 50 mg via INTRAVENOUS

## 2013-11-26 MED ORDER — NEOSTIGMINE METHYLSULFATE 1 MG/ML IJ SOLN
INTRAMUSCULAR | Status: DC | PRN
  Administered 2013-11-26: 4 mg via INTRAVENOUS

## 2013-11-26 MED ORDER — SODIUM CHLORIDE 0.9 % IR SOLN
Status: DC | PRN
  Administered 2013-11-26: 1000 mL

## 2013-11-26 MED ORDER — FENTANYL CITRATE 0.05 MG/ML IJ SOLN
INTRAMUSCULAR | Status: AC
  Filled 2013-11-26: qty 5

## 2013-11-26 MED ORDER — MORPHINE SULFATE 2 MG/ML IJ SOLN
0.5000 mg | INTRAMUSCULAR | Status: DC | PRN
  Administered 2013-11-26: 0.5 mg via INTRAVENOUS
  Filled 2013-11-26: qty 1

## 2013-11-26 MED ORDER — HYDROCODONE-ACETAMINOPHEN 5-325 MG PO TABS
ORAL_TABLET | ORAL | Status: AC
Start: 2013-11-26 — End: 2013-11-27
  Filled 2013-11-26: qty 2

## 2013-11-26 MED ORDER — PROPOFOL 10 MG/ML IV BOLUS
INTRAVENOUS | Status: DC | PRN
  Administered 2013-11-26: 150 mg via INTRAVENOUS

## 2013-11-26 MED ORDER — METOCLOPRAMIDE HCL 5 MG/ML IJ SOLN: 5.0000 mg | Freq: Three times a day (TID) | INTRAMUSCULAR | Status: DC | PRN

## 2013-11-26 MED ORDER — METOCLOPRAMIDE HCL 10 MG PO TABS: 5.0000 mg | ORAL_TABLET | Freq: Three times a day (TID) | ORAL | Status: DC | PRN

## 2013-11-26 MED ORDER — ACETAMINOPHEN 650 MG RE SUPP: 650.0000 mg | Freq: Four times a day (QID) | RECTAL | Status: DC | PRN

## 2013-11-26 MED ORDER — NEOSTIGMINE METHYLSULFATE 1 MG/ML IJ SOLN
INTRAMUSCULAR | Status: AC
  Filled 2013-11-26: qty 10

## 2013-11-26 MED ORDER — FENTANYL CITRATE 0.05 MG/ML IJ SOLN
INTRAMUSCULAR | Status: DC | PRN
  Administered 2013-11-26 (×4): 50 ug via INTRAVENOUS
  Administered 2013-11-26: 100 ug via INTRAVENOUS
  Administered 2013-11-26: 50 ug via INTRAVENOUS

## 2013-11-26 MED ORDER — ONDANSETRON HCL 4 MG/2ML IJ SOLN
INTRAMUSCULAR | Status: AC
  Filled 2013-11-26: qty 2

## 2013-11-26 MED ORDER — ENOXAPARIN SODIUM 40 MG/0.4ML ~~LOC~~ SOLN
40.0000 mg | SUBCUTANEOUS | Status: DC
  Administered 2013-11-27 – 2013-12-01 (×5): 40 mg via SUBCUTANEOUS
  Filled 2013-11-26 (×7): qty 0.4

## 2013-11-26 MED ORDER — LACTATED RINGERS IV SOLN
INTRAVENOUS | Status: DC | PRN
  Administered 2013-11-26 (×2): via INTRAVENOUS

## 2013-11-26 MED ORDER — ONDANSETRON HCL 4 MG/2ML IJ SOLN: 4.0000 mg | Freq: Four times a day (QID) | INTRAMUSCULAR | Status: DC | PRN

## 2013-11-26 MED ORDER — LEVETIRACETAM 500 MG PO TABS
500.0000 mg | ORAL_TABLET | Freq: Two times a day (BID) | ORAL | Status: DC
  Administered 2013-11-27 – 2013-12-01 (×10): 500 mg via ORAL
  Filled 2013-11-26 (×12): qty 1

## 2013-11-26 MED ORDER — CEFAZOLIN SODIUM-DEXTROSE 2-3 GM-% IV SOLR
2.0000 g | Freq: Four times a day (QID) | INTRAVENOUS | Status: AC
  Administered 2013-11-27 (×2): 2 g via INTRAVENOUS
  Filled 2013-11-26 (×2): qty 50

## 2013-11-26 MED ORDER — HYDROCODONE-ACETAMINOPHEN 5-325 MG PO TABS: 1.0000 | ORAL_TABLET | Freq: Four times a day (QID) | ORAL | Status: DC | PRN

## 2013-11-26 MED ORDER — OXYCODONE HCL 5 MG PO TABS
ORAL_TABLET | ORAL | Status: AC
  Filled 2013-11-26: qty 1

## 2013-11-26 MED ORDER — CEFAZOLIN SODIUM-DEXTROSE 2-3 GM-% IV SOLR
INTRAVENOUS | Status: DC | PRN
  Administered 2013-11-26: 2 g via INTRAVENOUS

## 2013-11-26 MED ORDER — LIDOCAINE HCL (CARDIAC) 20 MG/ML IV SOLN
INTRAVENOUS | Status: AC
  Filled 2013-11-26: qty 5

## 2013-11-26 MED ORDER — METHOCARBAMOL 500 MG PO TABS
ORAL_TABLET | ORAL | Status: AC
  Filled 2013-11-26: qty 1

## 2013-11-26 MED ORDER — PROPOFOL 10 MG/ML IV BOLUS
INTRAVENOUS | Status: AC
  Filled 2013-11-26: qty 20

## 2013-11-26 MED ORDER — MORPHINE SULFATE 2 MG/ML IJ SOLN
0.5000 mg | INTRAMUSCULAR | Status: DC | PRN
  Administered 2013-11-27 – 2013-11-28 (×4): 0.5 mg via INTRAVENOUS
  Filled 2013-11-26 (×4): qty 1

## 2013-11-26 MED ORDER — CEFAZOLIN SODIUM-DEXTROSE 2-3 GM-% IV SOLR
INTRAVENOUS | Status: AC
  Filled 2013-11-26: qty 50

## 2013-11-26 MED ORDER — SUCCINYLCHOLINE CHLORIDE 20 MG/ML IJ SOLN
INTRAMUSCULAR | Status: DC | PRN
  Administered 2013-11-26: 120 mg via INTRAVENOUS

## 2013-11-26 MED ORDER — SIMVASTATIN 20 MG PO TABS
20.0000 mg | ORAL_TABLET | Freq: Every evening | ORAL | Status: DC
  Administered 2013-11-27 – 2013-11-30 (×5): 20 mg via ORAL
  Filled 2013-11-26 (×7): qty 1

## 2013-11-26 MED ORDER — ROCURONIUM BROMIDE 100 MG/10ML IV SOLN
INTRAVENOUS | Status: DC | PRN
  Administered 2013-11-26: 20 mg via INTRAVENOUS

## 2013-11-26 MED ORDER — ARTIFICIAL TEARS OP OINT
TOPICAL_OINTMENT | OPHTHALMIC | Status: AC
  Filled 2013-11-26: qty 3.5

## 2013-11-26 MED ORDER — FENTANYL CITRATE 0.05 MG/ML IJ SOLN
25.0000 ug | INTRAMUSCULAR | Status: DC | PRN
  Administered 2013-11-26 (×2): 50 ug via INTRAVENOUS

## 2013-11-26 MED ORDER — MIDAZOLAM HCL 2 MG/2ML IJ SOLN
INTRAMUSCULAR | Status: AC
  Filled 2013-11-26: qty 2

## 2013-11-26 MED ORDER — POLYETHYLENE GLYCOL 3350 17 G PO PACK
17.0000 g | PACK | Freq: Every day | ORAL | Status: DC | PRN
Start: 2013-11-26 — End: 2013-12-01

## 2013-11-26 MED ORDER — MENTHOL 3 MG MT LOZG
1.0000 | LOZENGE | OROMUCOSAL | Status: DC | PRN
Start: 2013-11-26 — End: 2013-12-01

## 2013-11-26 MED ORDER — GLYCOPYRROLATE 0.2 MG/ML IJ SOLN
INTRAMUSCULAR | Status: AC
  Filled 2013-11-26: qty 3

## 2013-11-26 MED ORDER — PHENYTOIN SODIUM EXTENDED 100 MG PO CAPS
100.0000 mg | ORAL_CAPSULE | Freq: Two times a day (BID) | ORAL | Status: DC
  Administered 2013-11-27 – 2013-12-01 (×10): 100 mg via ORAL
  Filled 2013-11-26 (×12): qty 1

## 2013-11-26 MED ORDER — GLYCOPYRROLATE 0.2 MG/ML IJ SOLN
INTRAMUSCULAR | Status: DC | PRN
  Administered 2013-11-26: 0.6 mg via INTRAVENOUS

## 2013-11-26 MED ORDER — ZOLPIDEM TARTRATE 5 MG PO TABS: 5.0000 mg | ORAL_TABLET | Freq: Every evening | ORAL | Status: DC | PRN

## 2013-11-26 MED ORDER — FENTANYL CITRATE 0.05 MG/ML IJ SOLN
INTRAMUSCULAR | Status: AC
  Filled 2013-11-26: qty 2

## 2013-11-26 MED ORDER — DOCUSATE SODIUM 100 MG PO CAPS
100.0000 mg | ORAL_CAPSULE | Freq: Two times a day (BID) | ORAL | Status: DC
  Administered 2013-11-27 – 2013-12-01 (×10): 100 mg via ORAL
  Filled 2013-11-26 (×11): qty 1

## 2013-11-26 MED ORDER — ONDANSETRON HCL 4 MG PO TABS: 4.0000 mg | ORAL_TABLET | Freq: Four times a day (QID) | ORAL | Status: DC | PRN

## 2013-11-26 MED ORDER — ASPIRIN EC 325 MG PO TBEC
325.0000 mg | DELAYED_RELEASE_TABLET | Freq: Two times a day (BID) | ORAL | Status: DC
  Administered 2013-11-27: 325 mg via ORAL
  Filled 2013-11-26 (×3): qty 1

## 2013-11-26 MED ORDER — METOPROLOL SUCCINATE ER 25 MG PO TB24
25.0000 mg | ORAL_TABLET | Freq: Every day | ORAL | Status: DC
  Administered 2013-11-27 – 2013-12-01 (×5): 25 mg via ORAL
  Filled 2013-11-26 (×5): qty 1

## 2013-11-26 MED ORDER — PHENOL 1.4 % MT LIQD: 1.0000 | OROMUCOSAL | Status: DC | PRN

## 2013-11-26 MED ORDER — DOCUSATE SODIUM 100 MG PO CAPS: 100.0000 mg | ORAL_CAPSULE | Freq: Two times a day (BID) | ORAL | Status: DC

## 2013-11-26 MED ORDER — HYDROCODONE-ACETAMINOPHEN 5-325 MG PO TABS
1.0000 | ORAL_TABLET | Freq: Four times a day (QID) | ORAL | Status: DC | PRN
  Administered 2013-11-26 (×2): 1 via ORAL
  Administered 2013-11-27 – 2013-11-29 (×6): 2 via ORAL
  Administered 2013-11-30: 1 via ORAL
  Filled 2013-11-26 (×5): qty 2
  Filled 2013-11-26: qty 1
  Filled 2013-11-26: qty 2

## 2013-11-26 MED ORDER — OXYCODONE-ACETAMINOPHEN 5-325 MG PO TABS
2.0000 | ORAL_TABLET | Freq: Once | ORAL | Status: AC
  Administered 2013-11-26: 2 via ORAL
  Filled 2013-11-26: qty 2

## 2013-11-26 MED ORDER — ACETAMINOPHEN 325 MG PO TABS
650.0000 mg | ORAL_TABLET | Freq: Four times a day (QID) | ORAL | Status: DC | PRN
  Administered 2013-11-29 (×3): 650 mg via ORAL
  Filled 2013-11-26 (×4): qty 2

## 2013-11-26 MED ORDER — ONDANSETRON HCL 4 MG/2ML IJ SOLN
INTRAMUSCULAR | Status: DC | PRN
  Administered 2013-11-26: 4 mg via INTRAVENOUS

## 2013-11-26 MED ORDER — METHOCARBAMOL 500 MG PO TABS
500.0000 mg | ORAL_TABLET | Freq: Four times a day (QID) | ORAL | Status: DC | PRN
Start: 2013-11-26 — End: 2013-12-01
  Administered 2013-11-26 – 2013-11-29 (×5): 500 mg via ORAL
  Filled 2013-11-26 (×5): qty 1

## 2013-11-26 MED ORDER — METHOCARBAMOL 100 MG/ML IJ SOLN
500.0000 mg | Freq: Four times a day (QID) | INTRAVENOUS | Status: DC | PRN
  Filled 2013-11-26: qty 5

## 2013-11-26 SURGICAL SUPPLY — 41 items
BLADE SAGITTAL (BLADE) ×3
BLADE SAW THK.89X75X18XSGTL (BLADE) ×1 IMPLANT
CAPT HIP FX BIPOLAR/UNIPOLAR ×2 IMPLANT
CLOTH BEACON ORANGE TIMEOUT ST (SAFETY) ×3 IMPLANT
COVER SURGICAL LIGHT HANDLE (MISCELLANEOUS) ×3 IMPLANT
DRAPE HIP W/POCKET STRL (DRAPE) ×3 IMPLANT
DRAPE INCISE IOBAN 85X60 (DRAPES) ×4 IMPLANT
DRAPE U-SHAPE 47X51 STRL (DRAPES) ×3 IMPLANT
DRSG MEPILEX BORDER 4X8 (GAUZE/BANDAGES/DRESSINGS) ×3 IMPLANT
DURAPREP 26ML APPLICATOR (WOUND CARE) ×3 IMPLANT
ELECT BLADE 6.5 EXT (BLADE) ×2 IMPLANT
ELECT CAUTERY BLADE 6.4 (BLADE) ×3 IMPLANT
ELECT REM PT RETURN 9FT ADLT (ELECTROSURGICAL) ×3
ELECTRODE REM PT RTRN 9FT ADLT (ELECTROSURGICAL) ×1 IMPLANT
EVACUATOR 1/8 PVC DRAIN (DRAIN) IMPLANT
GAUZE XEROFORM 1X8 LF (GAUZE/BANDAGES/DRESSINGS) ×2 IMPLANT
GLOVE BIOGEL PI IND STRL 7.5 (GLOVE) ×1 IMPLANT
GLOVE BIOGEL PI IND STRL 8 (GLOVE) ×1 IMPLANT
GLOVE BIOGEL PI INDICATOR 7.5 (GLOVE) ×2
GLOVE BIOGEL PI INDICATOR 8 (GLOVE) ×2
GLOVE ORTHO TXT STRL SZ7.5 (GLOVE) ×6 IMPLANT
GOWN PREVENTION PLUS LG XLONG (DISPOSABLE) IMPLANT
GOWN STRL REUS W/ TWL LRG LVL3 (GOWN DISPOSABLE) ×2 IMPLANT
GOWN STRL REUS W/ TWL XL LVL3 (GOWN DISPOSABLE) ×4 IMPLANT
GOWN STRL REUS W/TWL LRG LVL3 (GOWN DISPOSABLE) ×6
GOWN STRL REUS W/TWL XL LVL3 (GOWN DISPOSABLE) ×6
KIT BASIN OR (CUSTOM PROCEDURE TRAY) ×3 IMPLANT
KIT ROOM TURNOVER OR (KITS) ×3 IMPLANT
MANIFOLD NEPTUNE II (INSTRUMENTS) ×1 IMPLANT
NS IRRIG 1000ML POUR BTL (IV SOLUTION) ×3 IMPLANT
PACK TOTAL JOINT (CUSTOM PROCEDURE TRAY) ×3 IMPLANT
PAD ARMBOARD 7.5X6 YLW CONV (MISCELLANEOUS) ×8 IMPLANT
STAPLER VISISTAT 35W (STAPLE) ×3 IMPLANT
SUT ETHIBOND NAB CT1 #1 30IN (SUTURE) ×6 IMPLANT
SUT VIC AB 1 CTB1 27 (SUTURE) ×6 IMPLANT
SUT VIC AB 2-0 CT1 27 (SUTURE) ×6
SUT VIC AB 2-0 CT1 TAPERPNT 27 (SUTURE) ×2 IMPLANT
TOWEL OR 17X24 6PK STRL BLUE (TOWEL DISPOSABLE) ×3 IMPLANT
TOWEL OR 17X26 10 PK STRL BLUE (TOWEL DISPOSABLE) ×5 IMPLANT
TRAY FOLEY CATH 16FRSI W/METER (SET/KITS/TRAYS/PACK) ×2 IMPLANT
WATER STERILE IRR 1000ML POUR (IV SOLUTION) ×3 IMPLANT

## 2013-11-26 NOTE — H&P (Signed)
[facility] History and Physical  [name] HTD:[id number] DOB: 24-Dec-1950 DOA: 11/26/2013  Referring physician: ED physician.  PCP: Philis Fendt, MD   Chief Complaint: left hip pain.   HPI: [name] is a 63 y.o. male with PMH significant for Seizure disorder, Stroke, gait disorder who presents after a mechanical fall. He was getting out of the car last night when he fell to the ground and hit his left hip. He is complaining of left hip pain. He denies chest pain, dyspnea. No dyspnea on exertion. He does relates chronic cough. He is current smoker.  Evaluation in the ED show Impacted fracture involving the subcapital left femoral neck. A chest a ray show pulmonary vascular congestion.  Dr Ninfa Linden has already evaluated the patient.     Review of Systems:  Negative , except as per HPI.   Past Medical History  Diagnosis Date  . History of alcoholism   . Hx of ischemic vertebrobasilar artery thalamic stroke     Right  . Gait disorder   . Dementia   . 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 Seizures   . Memory disorder 03/04/2013  . Gait disorder   . Stroke     Right frontal, right thalamic  . Ulnar neuropathy of left upper extremity   . Alcoholism     History of, not active   Past Surgical History  Procedure Laterality Date  . Wrist surgery Left 95 & 96   Social History:  reports that he has been smoking.  He does not have any smokeless tobacco history on file. He reports that he does not drink alcohol or use illicit drugs.  No Known Allergies  Family History  Problem Relation Age of Onset  . Pulmonary embolism Mother   . Liver disease Father      Prior to Admission medications   Medication Sig Start Date End Date Taking? Authorizing Provider  aspirin 81 MG tablet Take 81 mg by mouth daily.   Yes Historical Provider, MD  Cholecalciferol (VITAMIN D-3) 1000 UNITS CAPS Take 1 capsule by mouth daily.   Yes Historical Provider, MD  levETIRAcetam  (KEPPRA) 500 MG tablet Take 500 mg by mouth 2 (two) times daily.   Yes Historical Provider, MD  metoprolol succinate (TOPROL-XL) 25 MG 24 hr tablet Take 25 mg by mouth daily.   Yes Historical Provider, MD  phenytoin (DILANTIN) 100 MG ER capsule Take 1 capsule (100 mg total) by mouth 2 (two) times daily. 08/11/13  Yes Philmore Pali, NP  simvastatin (ZOCOR) 20 MG tablet Take 20 mg by mouth every evening.   Yes Historical Provider, MD   Physical Exam: Filed Vitals:   11/26/13 1230  BP: 124/75  Pulse: 98  Temp:   Resp: 22    BP 124/75  Pulse 98  Temp(Src) 98.6 F (37 C) (Oral)  Resp 22  SpO2 91%  General:  Appears calm and comfortable Eyes: PERRL, normal lids, irises & conjunctiva ENT: grossly normal hearing, lips & tongue Neck: no LAD, masses or thyromegaly Cardiovascular: RRR, no m/r/g. No LE edema., positive JVD.  Telemetry: SR, no arrhythmias  Respiratory: CTA bilaterally, no w/r/r. Normal respiratory effort. Abdomen: soft, ntnd Skin: no rash or induration seen on limited exam Musculoskeletal: grossly normal tone BUE/BLE, left hip tender.  Psychiatric: grossly normal mood and affect, speech fluent and appropriate           Labs on Admission:  Basic Metabolic Panel:  Recent Labs Lab 11/26/13 1230 11/26/13 1231  NA 143 141  K 4.2 3.9  CL 106 104  CO2 23  --   GLUCOSE 105* 109*  BUN 11 11  CREATININE 0.90 0.90  CALCIUM 9.4  --    Liver Function Tests:  Recent Labs Lab 11/26/13 1230  AST 17  ALT 17  ALKPHOS 146*  BILITOT 0.8  PROT 7.5  ALBUMIN 3.8   No results found for this basename: LIPASE, AMYLASE,  in the last 168 hours No results found for this basename: AMMONIA,  in the last 168 hours CBC:  Recent Labs Lab 11/26/13 1230 11/26/13 1231  WBC 9.9  --   NEUTROABS 7.9*  --   HGB 13.8 15.0  HCT 38.8* 44.0  MCV 91.9  --   PLT 145*  --    Cardiac Enzymes: No results found for this basename: CKTOTAL, CKMB, CKMBINDEX, TROPONINI,  in the last 168  hours  BNP (last 3 results) No results found for this basename: PROBNP,  in the last 8760 hours CBG: No results found for this basename: GLUCAP,  in the last 168 hours  Radiological Exams on Admission: Dg Chest 1 View  11/26/2013   CLINICAL DATA:  Preop radiograph.  Fall  EXAM: CHEST - 1 VIEW  COMPARISON:  05/16/2010  FINDINGS: The heart size and mediastinal contours are within normal limits. Pulmonary vascular congestion is noted. No overt edema or effusion identified. Both lungs are clear. The visualized skeletal structures are unremarkable.  IMPRESSION: 1. Pulmonary vascular congestion. 2. Examination is otherwise unremarkable.   Electronically Signed   By: Kerby Moors M.D.   On: 11/26/2013 11:42   Dg Hip Bilateral W/pelvis  11/26/2013   CLINICAL DATA:  Fall.  Severe left hip pain.  EXAM: BILATERAL HIP WITH PELVIS - 4+ VIEW  COMPARISON:  None.  FINDINGS: Impacted fracture involving the subcapital left femoral neck. No fractures elsewhere involving the bony pelvis or the proximal right femur.  Joint spaces well preserved in both hips. Bone mineral density well-preserved. Sacroiliac joints intact with degenerative changes. Symphysis pubis intact. Visualized lower lumbar spine intact.  IMPRESSION: Impacted fracture involving the subcapital left femoral neck.   Electronically Signed   By: Evangeline Dakin M.D.   On: 11/26/2013 10:27    EKG: Independently reviewed. Minimal ST elevation anterior lead.  Assessment/Plan Active Problems:   Hip fracture  1-left Hip Fracture; admit to telemetry. No prior history of heart diseases. EKG with some St changes, chest x ray with pulmonary vascular congestion. Patient denies dyspnea. He does not appear volume overload. I will check troponin times one. Will order ECHO, and BNP. If this is normal could proceed with surgery.   2-Seizure; continue with keppra and dilantin.  3-Continue with metoprolol.  4-History of stroke; resume aspirin post surgery.     Code Status: presume full code.  Family Communication: unable to contact family over phone.  Disposition Plan: expect 3 to 4 days inpatient.   Time spent: 75 minutes.   [facility] Pager [phone]

## 2013-11-26 NOTE — ED Provider Notes (Signed)
See prior note   Janice Norrie, MD 11/26/13 1345

## 2013-11-26 NOTE — ED Notes (Signed)
Patient at xray

## 2013-11-26 NOTE — ED Provider Notes (Signed)
Per patient's brother-in-law patient had gotten out of the vehicle last night about 8:30 pm and was walking around the vehicle to go into the house and he hit the bumper of the car which made him lose his balance and fall. He has been complaining of left hip pain since he fell. He has been unable to walk since he fell. He denies hitting his head.  Pt's sister is his POA.   Patient is alert and cooperative, he points to his left hip as the source of his pain. He is noted to have his left hip and knee flexed. He has no internal or external rotation. There is no obvious trauma seen to his head.  Medical screening examination/treatment/procedure(s) were conducted as a shared visit with non-physician practitioner(s) and myself.  I personally evaluated the patient during the encounter.  EKG Interpretation    Date/Time:  Thursday November 26 2013 11:51:53 EST Ventricular Rate:  97 PR Interval:  175 QRS Duration: 93 QT Interval:  345 QTC Calculation: 438 R Axis:   83 Text Interpretation:  Sinus rhythm Borderline right axis deviation Minimal ST elevation, anterior leads No significant change since last tracing Confirmed by [place]  MD-I, Cameren Odwyer ([id number]) on 11/26/2013 12:55:07 PM             Rolland Porter, MD, Abram Sander   Janice Norrie, MD 11/26/13 1255

## 2013-11-26 NOTE — Transfer of Care (Signed)
Immediate Anesthesia Transfer of Care Note  Patient: [name]  Procedure(s) Performed: Procedure(s): LEFT HIP HEMIARTHROPLASTY (Left)  Patient Location: PACU  Anesthesia Type:General  Level of Consciousness: awake  Airway & Oxygen Therapy: Patient Spontanous Breathing and Patient connected to face mask oxygen  Post-op Assessment: Report given to PACU RN and Post -op Vital signs reviewed and stable  Post vital signs: Reviewed and stable  Complications: No apparent anesthesia complications

## 2013-11-26 NOTE — Preoperative (Signed)
Beta Blockers   Reason not to administer Beta Blockers:Not Applicable 

## 2013-11-26 NOTE — Anesthesia Postprocedure Evaluation (Signed)
  Anesthesia Post-op Note  Patient: [name]  Procedure(s) Performed: Procedure(s): LEFT HIP HEMIARTHROPLASTY (Left)  Patient Location: PACU  Anesthesia Type:General  Level of Consciousness: awake  Airway and Oxygen Therapy: Patient Spontanous Breathing  Post-op Pain: mild  Post-op Assessment: Post-op Vital signs reviewed  Post-op Vital Signs: Reviewed  Complications: No apparent anesthesia complications

## 2013-11-26 NOTE — Progress Notes (Signed)
Echo Lab  2D Echocardiogram completed.  [place], [place] 11/26/2013 3:32 PM

## 2013-11-26 NOTE — Brief Op Note (Signed)
11/26/2013  8:12 PM  PATIENT:  [name]  63 y.o. male  PRE-OPERATIVE DIAGNOSIS:  l fem neck fracture  POST-OPERATIVE DIAGNOSIS:  Left hip femoral neck fracture  PROCEDURE:  Procedure(s): LEFT HIP HEMIARTHROPLASTY (Left)  SURGEON:  Surgeon(s) and Role:    * Mcarthur Rossetti, MD - Primary  PHYSICIAN ASSISTANT: Benita Stabile, PA-C  ANESTHESIA:   general  EBL:  Total I/O In: 1200 [I.V.:1200] Out: -   BLOOD ADMINISTERED:none  DRAINS: none   LOCAL MEDICATIONS USED:  NONE  SPECIMEN:  No Specimen  DISPOSITION OF SPECIMEN:  N/A  COUNTS:  YES  TOURNIQUET:  * No tourniquets in log *  DICTATION: .Other Dictation: Dictation Number [id number]  PLAN OF CARE: Admit to inpatient   PATIENT DISPOSITION:  PACU - hemodynamically stable.   Delay start of Pharmacological VTE agent (>24hrs) due to surgical blood loss or risk of bleeding: no

## 2013-11-26 NOTE — ED Provider Notes (Signed)
CSN: [id number]     Arrival date & time 11/26/13  0905 History   First MD Initiated Contact with Patient 11/26/13 [phone]     No chief complaint on file.  (Consider location/radiation/quality/duration/timing/severity/associated sxs/prior Treatment) HPI Comments: Patient presents to the emergency department with chief complaint of left hip pain. He states that he was getting out of his car last night, when he tripped and fell, landing on his left hip. He states that he has been in pain all night. He is able to ambulate, but requires assistance from family members. He has not taken anything for his pain. States that the pain is moderate in severity. It does not radiate. He denies bowel or bladder incontinence. He did not lose consciousness, and there is no seizure-like activity. He denies any other complaints at this time.  The history is provided by the patient. No language interpreter was used.    Past Medical History  Diagnosis Date  . History of alcoholism   . Hx of ischemic vertebrobasilar artery thalamic stroke     Right  . Gait disorder   . Dementia   . 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 Seizures   . Memory disorder 03/04/2013  . Gait disorder   . Stroke     Right frontal, right thalamic  . Ulnar neuropathy of left upper extremity   . Alcoholism     History of, not active   Past Surgical History  Procedure Laterality Date  . Wrist surgery Left 95 & 96   Family History  Problem Relation Age of Onset  . Pulmonary embolism Mother   . Liver disease Father    History  Substance Use Topics  . Smoking status: Current Every Day Smoker -- 3.00 packs/day  . Smokeless tobacco: Not on file  . Alcohol Use: No     Comment: Former Alcoholic    Review of Systems  All other systems reviewed and are negative.    Allergies  Review of patient's allergies indicates no known allergies.  Home Medications   Current Outpatient Rx  Name  Route  Sig  Dispense  Refill  .  aspirin 81 MG tablet   Oral   Take 81 mg by mouth daily.         . Cholecalciferol (VITAMIN D-3) 1000 UNITS CAPS   Oral   Take 1 capsule by mouth daily.         Marland Kitchen levETIRAcetam (KEPPRA) 750 MG tablet   Oral   Take 1 tablet (750 mg total) by mouth 2 (two) times daily.   60 tablet   3   . metoprolol succinate (TOPROL-XL) 25 MG 24 hr tablet   Oral   Take 25 mg by mouth daily.         . phenytoin (DILANTIN) 100 MG ER capsule   Oral   Take 1 capsule (100 mg total) by mouth 2 (two) times daily.   180 capsule   1   . simvastatin (ZOCOR) 20 MG tablet   Oral   Take 20 mg by mouth every evening.          There were no vitals taken for this visit. Physical Exam  Nursing note and vitals reviewed. Constitutional: He is oriented to person, place, and time. He appears well-developed and well-nourished.  HENT:  Head: Normocephalic and atraumatic.  Eyes: Conjunctivae and EOM are normal. Pupils are equal, round, and reactive to light. Right eye exhibits no discharge. Left eye  exhibits no discharge. No scleral icterus.  Neck: Normal range of motion. Neck supple. No JVD present.  Cardiovascular: Normal rate, regular rhythm and normal heart sounds.  Exam reveals no gallop and no friction rub.   No murmur heard. Pulmonary/Chest: Effort normal and breath sounds normal. No respiratory distress. He has no wheezes. He has no rales. He exhibits no tenderness.  Abdominal: Soft. He exhibits no distension and no mass. There is no tenderness. There is no rebound and no guarding.  Musculoskeletal: Normal range of motion. He exhibits no edema and no tenderness.  Left hip tender to palpation, no bony abnormality or deformity, range of motion and strength reduced secondary to pain  Neurological: He is alert and oriented to person, place, and time.  Skin: Skin is warm and dry.  Psychiatric: He has a normal mood and affect. His behavior is normal. Judgment and thought content normal.    ED Course   Procedures (including critical care time) Results for orders placed during the hospital encounter of 11/26/13  CBC WITH DIFFERENTIAL      Result Value Range   WBC 9.9  4.0 - 10.5 K/uL   RBC 4.22  4.22 - 5.81 MIL/uL   Hemoglobin 13.8  13.0 - 17.0 g/dL   HCT 38.8 (*) 39.0 - 52.0 %   MCV 91.9  78.0 - 100.0 fL   MCH 32.7  26.0 - 34.0 pg   MCHC 35.6  30.0 - 36.0 g/dL   RDW 12.4  11.5 - 15.5 %   Platelets 145 (*) 150 - 400 K/uL   Neutrophils Relative % 80 (*) 43 - 77 %   Neutro Abs 7.9 (*) 1.7 - 7.7 K/uL   Lymphocytes Relative 13  12 - 46 %   Lymphs Abs 1.3  0.7 - 4.0 K/uL   Monocytes Relative 7  3 - 12 %   Monocytes Absolute 0.7  0.1 - 1.0 K/uL   Eosinophils Relative 0  0 - 5 %   Eosinophils Absolute 0.0  0.0 - 0.7 K/uL   Basophils Relative 0  0 - 1 %   Basophils Absolute 0.0  0.0 - 0.1 K/uL  URINALYSIS, ROUTINE W REFLEX MICROSCOPIC      Result Value Range   Color, Urine YELLOW  YELLOW   APPearance CLEAR  CLEAR   Specific Gravity, Urine 1.014  1.005 - 1.030   pH 7.5  5.0 - 8.0   Glucose, UA NEGATIVE  NEGATIVE mg/dL   Hgb urine dipstick NEGATIVE  NEGATIVE   Bilirubin Urine NEGATIVE  NEGATIVE   Ketones, ur NEGATIVE  NEGATIVE mg/dL   Protein, ur NEGATIVE  NEGATIVE mg/dL   Urobilinogen, UA 1.0  0.0 - 1.0 mg/dL   Nitrite NEGATIVE  NEGATIVE   Leukocytes, UA NEGATIVE  NEGATIVE  APTT      Result Value Range   aPTT 28  24 - 37 seconds  PROTIME-INR      Result Value Range   Prothrombin Time 13.9  11.6 - 15.2 seconds   INR 1.09  0.00 - 1.49  POCT I-STAT, CHEM 8      Result Value Range   Sodium 141  137 - 147 mEq/L   Potassium 3.9  3.7 - 5.3 mEq/L   Chloride 104  96 - 112 mEq/L   BUN 11  6 - 23 mg/dL   Creatinine, Ser 0.90  0.50 - 1.35 mg/dL   Glucose, Bld 109 (*) 70 - 99 mg/dL   Calcium, Ion 1.20  1.13 -  1.30 mmol/L   TCO2 24  0 - 100 mmol/L   Hemoglobin 15.0  13.0 - 17.0 g/dL   HCT 44.0  39.0 - 52.0 %  SAMPLE TO BLOOD BANK      Result Value Range   Blood Bank Specimen  SAMPLE AVAILABLE FOR TESTING     Sample Expiration 11/27/2013     Dg Chest 1 View  11/26/2013   CLINICAL DATA:  Preop radiograph.  Fall  EXAM: CHEST - 1 VIEW  COMPARISON:  05/16/2010  FINDINGS: The heart size and mediastinal contours are within normal limits. Pulmonary vascular congestion is noted. No overt edema or effusion identified. Both lungs are clear. The visualized skeletal structures are unremarkable.  IMPRESSION: 1. Pulmonary vascular congestion. 2. Examination is otherwise unremarkable.   Electronically Signed   By: Kerby Moors M.D.   On: 11/26/2013 11:42   Dg Hip Bilateral W/pelvis  11/26/2013   CLINICAL DATA:  Fall.  Severe left hip pain.  EXAM: BILATERAL HIP WITH PELVIS - 4+ VIEW  COMPARISON:  None.  FINDINGS: Impacted fracture involving the subcapital left femoral neck. No fractures elsewhere involving the bony pelvis or the proximal right femur.  Joint spaces well preserved in both hips. Bone mineral density well-preserved. Sacroiliac joints intact with degenerative changes. Symphysis pubis intact. Visualized lower lumbar spine intact.  IMPRESSION: Impacted fracture involving the subcapital left femoral neck.   Electronically Signed   By: Evangeline Dakin M.D.   On: 11/26/2013 10:27     EKG Interpretation   None       MDM   1. Hip fracture     Patient with left hip pain.  Plain film shows left impacted fracture of the femoral neck. Discussed patient with on call orthopedic physician, who will see the patient today. Will check labs, and admit to the hospitalist.   Montine Circle, PA-C 11/26/13 1345

## 2013-11-26 NOTE — Anesthesia Procedure Notes (Signed)
Procedure Name: Intubation Date/Time: 11/26/2013 7:19 PM Performed by: Maude Leriche D Pre-anesthesia Checklist: Patient identified, Emergency Drugs available, Suction available, Patient being monitored and Timeout performed Patient Re-evaluated:Patient Re-evaluated prior to inductionOxygen Delivery Method: Circle system utilized Preoxygenation: Pre-oxygenation with 100% oxygen Intubation Type: IV induction and Rapid sequence Laryngoscope Size: Miller and 2 Grade View: Grade I Tube type: Oral Tube size: 7.5 mm Number of attempts: 1 Airway Equipment and Method: Stylet Placement Confirmation: ETT inserted through vocal cords under direct vision,  positive ETCO2 and breath sounds checked- equal and bilateral Secured at: 22 cm Tube secured with: Tape Dental Injury: Teeth and Oropharynx as per pre-operative assessment

## 2013-11-26 NOTE — Anesthesia Preprocedure Evaluation (Addendum)
Anesthesia Evaluation  Patient identified by MRN, date of birth, ID band Patient awake    Reviewed: Allergy & Precautions, H&P , NPO status , Patient's Chart, lab work & pertinent test results, reviewed documented beta blocker date and time   Airway Mallampati: II TM Distance: >3 FB Neck ROM: full    Dental  (+) Edentulous Upper and Dental Advisory Given   Pulmonary Current Smoker,  breath sounds clear to auscultation        Cardiovascular hypertension, On Home Beta Blockers and On Medications + Peripheral Vascular Disease Rhythm:regular     Neuro/Psych Seizures -,  PSYCHIATRIC DISORDERS Depression  Neuromuscular disease CVA, Residual Symptoms    GI/Hepatic negative GI ROS, Neg liver ROS, (+)     substance abuse  alcohol use,   Endo/Other  negative endocrine ROS  Renal/GU negative Renal ROS  negative genitourinary   Musculoskeletal   Abdominal   Peds  Hematology negative hematology ROS (+)   Anesthesia Other Findings See surgeon's H&P   Reproductive/Obstetrics negative OB ROS                         Anesthesia Physical Anesthesia Plan  ASA: III and emergent  Anesthesia Plan: General   Post-op Pain Management:    Induction: Intravenous  Airway Management Planned: Oral ETT  Additional Equipment:   Intra-op Plan:   Post-operative Plan: Extubation in OR  Informed Consent: I have reviewed the patients History and Physical, chart, labs and discussed the procedure including the risks, benefits and alternatives for the proposed anesthesia with the patient or authorized representative who has indicated his/her understanding and acceptance.   Dental Advisory Given and Dental advisory given  Plan Discussed with: CRNA, Surgeon and Anesthesiologist  Anesthesia Plan Comments:        Anesthesia Quick Evaluation

## 2013-11-26 NOTE — Consult Note (Signed)
Reason for Consult:  Left hip fracture Referring Physician:   EDP  [name] is an 63 y.o. male.  HPI:   63 yo male with a history of mechanical falls due to his gait and seizure disorder.  Also has a history of a stroke in the past.  He sustained an accidental fall last evening due to his gait disturbance and then developed severe left hip pain and the inability to ambulate.  He was taken to the ED and found to have a left hip fracture.  Ortho is consulted to address his hip.  He is awake and alert and reports severe left hip pain.  Past Medical History  Diagnosis Date  . History of alcoholism   . Hx of ischemic vertebrobasilar artery thalamic stroke     Right  . Gait disorder   . Dementia   . 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 Seizures   . Memory disorder 03/04/2013  . Gait disorder   . Stroke     Right frontal, right thalamic  . Ulnar neuropathy of left upper extremity   . Alcoholism     History of, not active    Past Surgical History  Procedure Laterality Date  . Wrist surgery Left 95 & 96    Family History  Problem Relation Age of Onset  . Pulmonary embolism Mother   . Liver disease Father     Social History:  reports that he has been smoking.  He does not have any smokeless tobacco history on file. He reports that he does not drink alcohol or use illicit drugs.  Allergies: No Known Allergies  Medications: I have reviewed the patient's current medications.  No results found for this or any previous visit (from the past 48 hour(s)).  Dg Hip Bilateral W/pelvis  11/26/2013   CLINICAL DATA:  Fall.  Severe left hip pain.  EXAM: BILATERAL HIP WITH PELVIS - 4+ VIEW  COMPARISON:  None.  FINDINGS: Impacted fracture involving the subcapital left femoral neck. No fractures elsewhere involving the bony pelvis or the proximal right femur.  Joint spaces well preserved in both hips. Bone mineral density well-preserved. Sacroiliac joints intact with degenerative  changes. Symphysis pubis intact. Visualized lower lumbar spine intact.  IMPRESSION: Impacted fracture involving the subcapital left femoral neck.   Electronically Signed   By: Evangeline Dakin M.D.   On: 11/26/2013 10:27    ROS Blood pressure 143/71, pulse 99, temperature 98.6 F (37 C), temperature source Oral, resp. rate 16, SpO2 93.00%. Physical Exam  Musculoskeletal:       Left hip: He exhibits decreased range of motion, decreased strength, bony tenderness and deformity.    Assessment/Plan: Left hip femoral neck fracture 1)  From an orthopedic standpoint, I am recommending a left hip hemiarthroplasty (partial hip replacement) since this fracture is mildly impacted and displaced.  Also, given his poor gait and history of falls, he is a poor candidate for just hip screws.  With a hemiarthroplasty, we will be able to mobilize him better from a therapy standpoint.  I will plan on surgery for this evening after 5 pm at the earliest pending medical clearance.  If needed, can wait for 24-48 hours.  Please call me [phone]) if cleared for this evening.  Thanks. 2)  I spoke with him and a family member in length about his situation and discussed the recommendations for treating this fracture as well as the risks and benefits involved.  [name]  Y 11/26/2013, 11:10 AM

## 2013-11-26 NOTE — ED Notes (Addendum)
Pt presents via [facility] c/o of left hip pain.  Pt was getting out of the car last night around 8pm from the back seat on the driver side when he fell to the ground (concrete surface) on to his left hip.  Pt was able to ambulate with assistance to the home.   Pt denies LOC.

## 2013-11-27 ENCOUNTER — Encounter (HOSPITAL_COMMUNITY): Payer: Self-pay | Admitting: Orthopaedic Surgery

## 2013-11-27 DIAGNOSIS — R569 Unspecified convulsions: Secondary | ICD-10-CM

## 2013-11-27 DIAGNOSIS — I10 Essential (primary) hypertension: Secondary | ICD-10-CM

## 2013-11-27 DIAGNOSIS — R269 Unspecified abnormalities of gait and mobility: Secondary | ICD-10-CM

## 2013-11-27 LAB — BASIC METABOLIC PANEL
BUN: 11 mg/dL (ref 6–23)
CALCIUM: 8.8 mg/dL (ref 8.4–10.5)
CO2: 21 meq/L (ref 19–32)
CREATININE: 0.81 mg/dL (ref 0.50–1.35)
Chloride: 99 mEq/L (ref 96–112)
GFR calc Af Amer: >90 mL/min (ref >90)
Glucose, Bld: 95 mg/dL (ref 70–99)
Potassium: 4.1 mEq/L (ref 3.7–5.3)
SODIUM: 136 meq/L — AB (ref 137–147)

## 2013-11-27 LAB — CBC
HCT: 35.7 % — ABNORMAL LOW (ref 39.0–52.0)
Hemoglobin: 12.5 g/dL — ABNORMAL LOW (ref 13.0–17.0)
MCH: 32.6 pg (ref 26.0–34.0)
MCHC: 35 g/dL (ref 30.0–36.0)
MCV: 93 fL (ref 78.0–100.0)
PLATELETS: 131 10*3/uL — AB (ref 150–400)
RBC: 3.84 MIL/uL — AB (ref 4.22–5.81)
RDW: 12.7 % (ref 11.5–15.5)
WBC: 10.5 10*3/uL (ref 4.0–10.5)

## 2013-11-27 MED ORDER — NICOTINE 21 MG/24HR TD PT24
21.0000 mg | MEDICATED_PATCH | Freq: Every day | TRANSDERMAL | Status: DC
  Administered 2013-11-27 – 2013-12-01 (×5): 21 mg via TRANSDERMAL
  Filled 2013-11-27 (×5): qty 1

## 2013-11-27 MED ORDER — INFLUENZA VAC SPLIT QUAD 0.5 ML IM SUSP
0.5000 mL | INTRAMUSCULAR | Status: AC
  Administered 2013-11-28: 0.5 mL via INTRAMUSCULAR
  Filled 2013-11-27: qty 0.5

## 2013-11-27 MED ORDER — PANTOPRAZOLE SODIUM 20 MG PO TBEC
20.0000 mg | DELAYED_RELEASE_TABLET | Freq: Every day | ORAL | Status: DC
  Administered 2013-11-28 – 2013-12-01 (×4): 20 mg via ORAL
  Filled 2013-11-27 (×5): qty 1

## 2013-11-27 MED ORDER — ASPIRIN EC 325 MG PO TBEC
325.0000 mg | DELAYED_RELEASE_TABLET | Freq: Every day | ORAL | Status: DC
  Administered 2013-11-28 – 2013-12-01 (×4): 325 mg via ORAL
  Filled 2013-11-27 (×4): qty 1

## 2013-11-27 MED ORDER — ALBUTEROL SULFATE (2.5 MG/3ML) 0.083% IN NEBU: 2.5000 mg | INHALATION_SOLUTION | RESPIRATORY_TRACT | Status: DC | PRN

## 2013-11-27 NOTE — Progress Notes (Signed)
Subjective: 1 Day Post-Op Procedure(s) (LRB): LEFT HIP HEMIARTHROPLASTY (Left) Patient reports pain as mild.    Objective: Vital signs in last 24 hours: Temp:  [98.6 F (37 C)-99.5 F (37.5 C)] 98.6 F (37 C) (02/06 0657) Pulse Rate:  [83-106] 106 (02/06 0657) Resp:  [14-21] 18 (02/06 1200) BP: (135-167)/(70-101) 141/70 mmHg (02/06 0657) SpO2:  [92 %-99 %] 98 % (02/06 1200)  Intake/Output from previous day: 02/05 0701 - 02/06 0700 In: 1450 [I.V.:1200] Out: 300 [Urine:150; Blood:150] Intake/Output this shift: Total I/O In: 260 [P.O.:260] Out: 100 [Urine:100]   Recent Labs  11/26/13 1230 11/26/13 1231 11/27/13 0405  HGB 13.8 15.0 12.5*    Recent Labs  11/26/13 1230 11/26/13 1231 11/27/13 0405  WBC 9.9  --  10.5  RBC 4.22  --  3.84*  HCT 38.8* 44.0 35.7*  PLT 145*  --  131*    Recent Labs  11/26/13 1230 11/26/13 1231 11/27/13 0405  NA 143 141 136*  K 4.2 3.9 4.1  CL 106 104 99  CO2 23  --  21  BUN 11 11 11   CREATININE 0.90 0.90 0.81  GLUCOSE 105* 109* 95  CALCIUM 9.4  --  8.8    Recent Labs  11/26/13 1230  INR 1.09    Sensation intact distally Intact pulses distally Dorsiflexion/Plantar flexion intact Incision: dressing C/D/I Compartment soft  Assessment/Plan: 1 Day Post-Op Procedure(s) (LRB): LEFT HIP HEMIARTHROPLASTY (Left) Up with therapy  [name] 11/27/2013, 3:36 PM

## 2013-11-27 NOTE — Progress Notes (Signed)
Orthopedic Tech Progress Note Patient Details:  [name] 1951/10/14 [id number]  Ortho Devices Ortho Device/Splint Location: trapeze bar patient helper Ortho Device/Splint Interventions: Application   Hildred Priest 11/27/2013, 3:23 PM

## 2013-11-27 NOTE — Progress Notes (Addendum)
[facility] PROGRESS NOTE  [name] AST:[id number] DOB: July 02, 1951 DOA: 11/26/2013 PCP: Philis Fendt, MD  Assessment/Plan: 63 y.o. male with PMH significant for Seizure disorder, Stroke, gait disorder who presents after a mechanical fall and hip fracture  Active Problems:   1. left Hip Fracture;  -post  Stable; cont per ortho  2. Seizure; continue with keppra and dilantin.  3. HTN, Continue with metoprolol.  4. History of stroke; resume aspirin post surgery.  5. Suspected COPD/smoker; CXR: no clear infiltrates; mild congestion; no s/s of fluid overload; echo: LVEF: 55% -stop smoking, cont prn bronchodilators    Code Status: full Family Communication: d/w patient (indicate person spoken with, relationship, and if by phone, the number) Disposition Plan: pend pr eval    Consultants:  ortho  Procedures:  S/p L hip   Antibiotics:  None  (indicate start date, and stop date if known)  HPI/Subjective: alert  Objective: Filed Vitals:   11/27/13 0800  BP:   Pulse:   Temp:   Resp: 16    Intake/Output Summary (Last 24 hours) at 11/27/13 9892 Last data filed at 11/26/13 2100  Gross per 24 hour  Intake   1450 ml  Output    300 ml  Net   1150 ml   There were no vitals filed for this visit.  Exam:   General:  alert  Cardiovascular: s1,s2 rrr  Respiratory: CTA BL  Abdomen: soft, nt, nd   Musculoskeletal: no LE edema   Data Reviewed: Basic Metabolic Panel:  Recent Labs Lab 11/26/13 1230 11/26/13 1231 11/27/13 0405  NA 143 141 136*  K 4.2 3.9 4.1  CL 106 104 99  CO2 23  --  21  GLUCOSE 105* 109* 95  BUN 11 11 11   CREATININE 0.90 0.90 0.81  CALCIUM 9.4  --  8.8   Liver Function Tests:  Recent Labs Lab 11/26/13 1230  AST 17  ALT 17  ALKPHOS 146*  BILITOT 0.8  PROT 7.5  ALBUMIN 3.8   No results found for this basename: LIPASE, AMYLASE,  in the last 168 hours No results found for this basename: AMMONIA,  in the last 168  hours CBC:  Recent Labs Lab 11/26/13 1230 11/26/13 1231 11/27/13 0405  WBC 9.9  --  10.5  NEUTROABS 7.9*  --   --   HGB 13.8 15.0 12.5*  HCT 38.8* 44.0 35.7*  MCV 91.9  --  93.0  PLT 145*  --  131*   Cardiac Enzymes:  Recent Labs Lab 11/26/13 1337  TROPONINI <0.30   BNP (last 3 results)  Recent Labs  11/26/13 1324  PROBNP 122.9   CBG: No results found for this basename: GLUCAP,  in the last 168 hours  No results found for this or any previous visit (from the past 240 hour(s)).   Studies: Dg Chest 1 View  11/26/2013   CLINICAL DATA:  Preop radiograph.  Fall  EXAM: CHEST - 1 VIEW  COMPARISON:  05/16/2010  FINDINGS: The heart size and mediastinal contours are within normal limits. Pulmonary vascular congestion is noted. No overt edema or effusion identified. Both lungs are clear. The visualized skeletal structures are unremarkable.  IMPRESSION: 1. Pulmonary vascular congestion. 2. Examination is otherwise unremarkable.   Electronically Signed   By: Kerby Moors M.D.   On: 11/26/2013 11:42   Dg Hip Bilateral W/pelvis  11/26/2013   CLINICAL DATA:  Fall.  Severe left hip pain.  EXAM: BILATERAL HIP WITH PELVIS - 4+ VIEW  COMPARISON:  None.  FINDINGS: Impacted fracture involving the subcapital left femoral neck. No fractures elsewhere involving the bony pelvis or the proximal right femur.  Joint spaces well preserved in both hips. Bone mineral density well-preserved. Sacroiliac joints intact with degenerative changes. Symphysis pubis intact. Visualized lower lumbar spine intact.  IMPRESSION: Impacted fracture involving the subcapital left femoral neck.   Electronically Signed   By: Evangeline Dakin M.D.   On: 11/26/2013 10:27   Dg Pelvis Portable  11/26/2013   CLINICAL DATA:  Status post left hip replacement for fracture.  EXAM: PORTABLE PELVIS 1-2 VIEWS  COMPARISON:  Plain film 11/26/2013 at 10:10 a.m.  FINDINGS: New bipolar left hip hemiarthroplasty is in place. The device is  located and there is no fracture. Gas in the soft tissues from surgery and surgical staples are noted.  IMPRESSION: Left hip replacement without complicating feature.   Electronically Signed   By: Inge Rise M.D.   On: 11/26/2013 21:39    Scheduled Meds: . aspirin EC  325 mg Oral BID PC  . docusate sodium  100 mg Oral BID  . enoxaparin (LOVENOX) injection  40 mg Subcutaneous Q24H  . [START ON 11/28/2013] influenza vac split quadrivalent PF  0.5 mL Intramuscular Tomorrow-1000  . levETIRAcetam  500 mg Oral BID  . metoprolol succinate  25 mg Oral Daily  . phenytoin  100 mg Oral BID  . simvastatin  20 mg Oral QPM   Continuous Infusions:   Active Problems:   Memory disorder   Hip fracture   Seizure    Time spent:>35 minutes     Kinnie Feil  [facility] Pager [phone]. If 7PM-7AM, please contact night-coverage at [email or web address], password [facility] 11/27/2013, 9:38 AM  LOS: 1 day

## 2013-11-27 NOTE — Progress Notes (Signed)
Utilization review completed. Jamilia Jacques, RN, BSN. 

## 2013-11-27 NOTE — Progress Notes (Signed)
PT recommended SNF, referral made to CSW. CM will continue to follow for d/c needs. Fuller Plan RN, BSN, CCM

## 2013-11-27 NOTE — Progress Notes (Signed)
Nutrition Brief Note  Consult received for new hip fracture.   Per pt he has had no recent weight changes and has a good appetite PTA. Pt usually wakes at noon, lunch is a bologna sandwich, dinner is hamburger and fries. Pt snacks the rest of the evening and goes to sleep late.    Wt Readings from Last 15 Encounters:  08/11/13 154 lb (69.854 kg)  03/04/13 156 lb (70.761 kg)  07/24/12 150 lb (68.04 kg)    BMI: 22.8 - WNL   Current diet order is Regular, patient is consuming approximately 75% of meals at this time. Labs and medications reviewed.   No nutrition interventions warranted at this time. If nutrition issues arise, please consult RD.   [place], [place], [place] Pager [phone] After Hours Pager

## 2013-11-27 NOTE — Evaluation (Signed)
Physical Therapy Evaluation Patient Details Name: [name] MRN: [id number] DOB: Feb 04, 1951 Today's Date: 11/27/2013 Time: 8546-2703 PT Time Calculation (min): 28 min  PT Assessment / Plan / Recommendation History of Present Illness  Pt is a 63 y/o male admitted s/p mechanical fall. Per chart review, pt fell while getting out of a car, sustaining a femoral neck fracture. Per pt, he fell inside his home.   Clinical Impression  This patient presents with acute pain and decreased functional independence following the above mentioned procedure. At the time of PT eval, pt was able to perform transfers and ambulation with +2 assist for physical assist and safety. This patient is appropriate for skilled PT interventions to address functional limitations, improve safety and independence with functional mobility, and return to PLOF.     PT Assessment  Patient needs continued PT services    Follow Up Recommendations  SNF    Does the patient have the potential to tolerate intense rehabilitation      Barriers to Discharge        Equipment Recommendations  Other (comment) (TBD by next venue of care)    Recommendations for Other Services     Frequency Min 5X/week    Precautions / Restrictions Precautions Precautions: Fall;Anterior Hip Precaution Comments: RN spoke with MD who confirmed anterior hip precautions only. Discussed with pt. Restrictions Weight Bearing Restrictions: Yes LLE Weight Bearing: Weight bearing as tolerated   Pertinent Vitals/Pain Pt unable to rate pain on 0-10 scale; only states "it hurts".      Mobility  Bed Mobility Overal bed mobility: Needs Assistance;+2 for physical assistance Bed Mobility: Supine to Sit Supine to sit: +2 for physical assistance;Mod assist General bed mobility comments: Hand-over-hand assist to grab bed rails for support. Bed pad used to transition to sitting position, with assist and support mainly for LLE movement.  Transfers Overall  transfer level: Needs assistance Equipment used: Rolling walker (2 wheeled) Transfers: Sit to/from Stand Sit to Stand: Mod assist;+2 physical assistance General transfer comment: VC's for hand placement on seated surface for safety. Pt also cued for RLE flexion and LLE extension prior to initiating transfer to standing.  Ambulation/Gait Ambulation/Gait assistance: Min assist;+2 safety/equipment Ambulation Distance (Feet): 10 Feet Assistive device: Rolling walker (2 wheeled) Gait Pattern/deviations: Step-to pattern;Decreased stride length;Trunk flexed Gait velocity: Decreased Gait velocity interpretation: Below normal speed for age/gender General Gait Details: VC's for sequencing and safety awareness. Assist for occasional movement and positioning of RW for advancement of LE's. Chair follow utilized.     Exercises General Exercises - Lower Extremity Ankle Circles/Pumps: 10 reps Quad Sets: 10 reps   PT Diagnosis: Difficulty walking;Acute pain  PT Problem List: Decreased strength;Decreased range of motion;Decreased activity tolerance;Decreased balance;Decreased mobility;Decreased knowledge of use of DME;Decreased safety awareness;Pain;Decreased knowledge of precautions PT Treatment Interventions: DME instruction;Gait training;Stair training;Functional mobility training;Therapeutic activities;Therapeutic exercise;Neuromuscular re-education;Patient/family education     PT Goals(Current goals can be found in the care plan section) Acute Rehab PT Goals Patient Stated Goal: Unable to state PT Goal Formulation: With patient Time For Goal Achievement: 12/11/13 Potential to Achieve Goals: Fair  Visit Information  Last PT Received On: 11/27/13 Assistance Needed: +2 (Safety) History of Present Illness: Pt is a 63 y/o male admitted s/p mechanical fall. Per chart review, pt fell while getting out of a car, sustaining a femoral neck fracture. Per pt, he fell inside his home.        Prior  Functioning  Home Living Family/patient expects to be discharged to::  Private residence Living Arrangements: Other relatives (Sister) Available Help at Discharge: Family;Available PRN/intermittently Type of Home: Other(Comment) (Pt unsure of sister's residence type) Home Access: Stairs to enter [employer] of Steps: possibly 5 Entrance Stairs-Rails:  (Unsure) Home Layout: Other (Comment) (Unsure) Home Equipment: None Additional Comments: Pt unable to provide many details of home situation, Case Manager notified after session.  Prior Function Level of Independence: Needs assistance ADL's / Homemaking Assistance Needed: Sister helps with meals and dressing Communication Communication: No difficulties Dominant Hand: Left    Cognition  Cognition Arousal/Alertness: Awake/alert Behavior During Therapy: WFL for tasks assessed/performed Overall Cognitive Status: No family/caregiver present to determine baseline cognitive functioning Memory: Decreased short-term memory    Extremity/Trunk Assessment Upper Extremity Assessment Upper Extremity Assessment: Defer to OT evaluation Lower Extremity Assessment Lower Extremity Assessment: LLE deficits/detail LLE Deficits / Details: Decreased strength and AROM consistent with hip hemi Cervical / Trunk Assessment Cervical / Trunk Assessment: Normal   Balance Balance Overall balance assessment: Needs assistance Sitting-balance support: Feet supported;Bilateral upper extremity supported Sitting balance-Leahy Scale: Fair Postural control: Posterior lean Standing balance support: Bilateral upper extremity supported Standing balance-Leahy Scale: Fair  End of Session PT - End of Session Equipment Utilized During Treatment: Gait belt Activity Tolerance: Patient limited by fatigue;Patient limited by lethargy Patient left: in chair;with call bell/phone within reach Nurse Communication: Mobility status  GP     Jolyn Lent 11/27/2013,  12:09 PM  Jolyn Lent, [place], DPT [phone]

## 2013-11-28 LAB — CBC
HEMATOCRIT: 32.6 % — AB (ref 39.0–52.0)
Hemoglobin: 11.5 g/dL — ABNORMAL LOW (ref 13.0–17.0)
MCH: 32.3 pg (ref 26.0–34.0)
MCHC: 35.3 g/dL (ref 30.0–36.0)
MCV: 91.6 fL (ref 78.0–100.0)
Platelets: 108 10*3/uL — ABNORMAL LOW (ref 150–400)
RBC: 3.56 MIL/uL — AB (ref 4.22–5.81)
RDW: 12.1 % (ref 11.5–15.5)
WBC: 10.4 10*3/uL (ref 4.0–10.5)

## 2013-11-28 LAB — BASIC METABOLIC PANEL
BUN: 9 mg/dL (ref 6–23)
CHLORIDE: 98 meq/L (ref 96–112)
CO2: 23 meq/L (ref 19–32)
CREATININE: 0.72 mg/dL (ref 0.50–1.35)
Calcium: 8.3 mg/dL — ABNORMAL LOW (ref 8.4–10.5)
GFR calc Af Amer: >90 mL/min (ref >90)
GFR calc non Af Amer: >90 mL/min (ref >90)
Glucose, Bld: 140 mg/dL — ABNORMAL HIGH (ref 70–99)
Potassium: 3.6 mEq/L — ABNORMAL LOW (ref 3.7–5.3)
Sodium: 135 mEq/L — ABNORMAL LOW (ref 137–147)

## 2013-11-28 MED ORDER — HYDROCODONE-ACETAMINOPHEN 5-325 MG PO TABS: 1.0000 | ORAL_TABLET | Freq: Four times a day (QID) | ORAL | Status: DC | PRN

## 2013-11-28 MED ORDER — ASPIRIN 325 MG PO TBEC: 325.0000 mg | DELAYED_RELEASE_TABLET | Freq: Two times a day (BID) | ORAL | Status: DC

## 2013-11-28 NOTE — Op Note (Signed)
NAMEMAVERYCK, BAHRI NO.:  [id number]  MEDICAL RECORD NO.:  [id number]  LOCATION:  [id number]                        FACILITY:  [facility]  PHYSICIAN:  Lind Guest. Ninfa Linden, M.D.DATE OF BIRTH:  07/07/1951  DATE OF PROCEDURE:  11/26/2013 DATE OF DISCHARGE:                              OPERATIVE REPORT   PREOPERATIVE DIAGNOSIS:  Left hip displaced femoral neck fracture.  POSTOPERATIVE DIAGNOSIS:  Left hip displaced femoral neck fracture.  PROCEDURE:  Left hip hemiarthroplasty.  IMPLANTS:  DePuy size 5 Summit Basic press-fit stem with size 51 unipolar head and a -3 taper spacer.  SURGEON:  Jean Rosenthal, MD.  ASSISTING:  Erskine Emery, PA-C  ANESTHESIA:  General.  ANTIBIOTICS:  2 g IV Ancef.  BLOOD LOSS:  150 mL.  COMPLICATIONS:  None.  INDICATIONS:  [name] is a 63 year old gentleman with mild dementia and history of strokes as well as seizures disorder.  He was brought into the hospital a day after mechanical fall last evening.  He had inability to  ambulate and pain in his left hip.  He was found with x- rays to have a displaced left hip femoral neck fracture.  I talked to him and his family members at length as well as his sister, who is his healthcare power of attorney.  We recommended a left hip hemiarthroplasty given the displaced nature of the fracture.  I talked about the risks and benefits of surgery and General Medicine consult was obtained and medical clearance was provided.  PROCEDURE DESCRIPTION:  After informed consent was obtained, appropriate left hip was marked.  He was brought to the operating room, placed supine on the operating table.  General seizures was then obtained.  A Foley catheter was placed, and he was turned into a lateral decubitus position with the left operative hip up and padding under the axilla as well as the head and neck down on operative leg.  His left operative hip was then prepped and draped with DuraPrep  and sterile drapes including sterile stockinette.  Time-out was called to identify correct patient, correct left hip.  I then made incision directly over the greater trochanter and carried this proximally and distally.  I dissected down to the greater trochanter, and then proceeded with dividing the iliotibial band.  I then next proceeded with a direct anterolateral approach to the hip.  The 2/3 of gluteus medius and minimus were taken off the greater trochanter and reflected anteriorly.  I opened up the hip capsule and found a large hematoma, and I was able to place a corkscrew guide in the femoral head and remove the femoral head in its entirety.  I then made my femoral neck cut just proximal to lesser trochanter.  We trailed the head for a size 51 head.  With the leg then flexed and externally rotated off the table into a leg bag, administering the reamer to open the femoral canal and a lateralizing reamer to lateralize as well as a canal finding guide.  We then began broaching from a size 2 broach using the [place] broaching system up to a size 5. With a size 5,  I trailed a 51 head  with a -3 taper spacer due to my a higher neck cut.  We reduced this and acetabulum was stable.  His leg lengths were equal.  We then dislocated the hip and removed the trial components.  I then replaced the real DePuy Summit Basic press-fit stem size 5 with a size 51 unipolar head and a -3 taper spacer.  We reduced this back and the acetabulum was stable.  We then irrigated the soft tissue with normal saline solution and closed the joint capsule with interrupted #1 Ethibond suture.  I reapproximated the gluteus medius and minimus tendons to the greater trochanter using a #1 Ethibond suture in an interrupted format.  We closed the iliotibial band with interrupted #1 Vicryl suture followed by 0 Vicryl in the deep tissue, 2-0 Vicryl in subcutaneous tissue, and staples on the skin.  A well-padded  dressing was applied.  He was laid back in a supine position.  His leg lengths were near equal.  He was awakened, extubated, and taken to recovery room in stable condition. All final counts correct.  There were no complications noted.  Of note, Joshua Dingwall, PA-C was present in the entire case and his presence was crucial for getting this case completed.     Lind Guest. Ninfa Linden, M.D.     CYB/MEDQ  D:  11/26/2013  T:  11/27/2013  Job:  [id number]

## 2013-11-28 NOTE — Progress Notes (Addendum)
[facility] PROGRESS NOTE  [name] EPP:[id number] DOB: Apr 03, 1951 DOA: 11/26/2013 PCP: Philis Fendt, MD  Assessment/Plan: 63 y.o. male with PMH significant for Seizure disorder, Stroke, gait disorder who presents after a mechanical fall and hip fracture  Active Problems:   1. left Hip Fracture;  -s/p Left hip hemiarthroplasty on 2/5 stable; cont per ortho  2. Seizure; continue with keppra and dilantin.  3. HTN, Continue with metoprolol.  4. History of stroke; resume aspirin post surgery.  5. Suspected COPD/smoker; CXR: no clear infiltrates; mild congestion; no s/s of fluid overload; echo: LVEF: 55% -stop smoking, cont prn bronchodilators    Code Status: full Family Communication: d/w patient, called updated sister [phone], [phone]  (indicate person spoken with, relationship, and if by phone, the number) Disposition Plan: snf   Consultants:  ortho  Procedures:  S/p L hip   Antibiotics:  None  (indicate start date, and stop date if known)  HPI/Subjective: alert  Objective: Filed Vitals:   11/28/13 0546  BP: 125/63  Pulse: 85  Temp: 98.4 F (36.9 C)  Resp: 19    Intake/Output Summary (Last 24 hours) at 11/28/13 0942 Last data filed at 11/28/13 0500  Gross per 24 hour  Intake    275 ml  Output   1100 ml  Net   -825 ml   There were no vitals filed for this visit.  Exam:   General:  alert  Cardiovascular: s1,s2 rrr  Respiratory: CTA BL  Abdomen: soft, nt, nd   Musculoskeletal: no LE edema   Data Reviewed: Basic Metabolic Panel:  Recent Labs Lab 11/26/13 1230 11/26/13 1231 11/27/13 0405 11/28/13 0340  NA 143 141 136* 135*  K 4.2 3.9 4.1 3.6*  CL 106 104 99 98  CO2 23  --  21 23  GLUCOSE 105* 109* 95 140*  BUN 11 11 11 9   CREATININE 0.90 0.90 0.81 0.72  CALCIUM 9.4  --  8.8 8.3*   Liver Function Tests:  Recent Labs Lab 11/26/13 1230  AST 17  ALT 17  ALKPHOS 146*  BILITOT 0.8  PROT 7.5  ALBUMIN 3.8   No results  found for this basename: LIPASE, AMYLASE,  in the last 168 hours No results found for this basename: AMMONIA,  in the last 168 hours CBC:  Recent Labs Lab 11/26/13 1230 11/26/13 1231 11/27/13 0405 11/28/13 0340  WBC 9.9  --  10.5 10.4  NEUTROABS 7.9*  --   --   --   HGB 13.8 15.0 12.5* 11.5*  HCT 38.8* 44.0 35.7* 32.6*  MCV 91.9  --  93.0 91.6  PLT 145*  --  131* 108*   Cardiac Enzymes:  Recent Labs Lab 11/26/13 1337  TROPONINI <0.30   BNP (last 3 results)  Recent Labs  11/26/13 1324  PROBNP 122.9   CBG: No results found for this basename: GLUCAP,  in the last 168 hours  No results found for this or any previous visit (from the past 240 hour(s)).   Studies: Dg Chest 1 View  11/26/2013   CLINICAL DATA:  Preop radiograph.  Fall  EXAM: CHEST - 1 VIEW  COMPARISON:  05/16/2010  FINDINGS: The heart size and mediastinal contours are within normal limits. Pulmonary vascular congestion is noted. No overt edema or effusion identified. Both lungs are clear. The visualized skeletal structures are unremarkable.  IMPRESSION: 1. Pulmonary vascular congestion. 2. Examination is otherwise unremarkable.   Electronically Signed   By: Kerby Moors M.D.   On:  11/26/2013 11:42   Dg Hip Bilateral W/pelvis  11/26/2013   CLINICAL DATA:  Fall.  Severe left hip pain.  EXAM: BILATERAL HIP WITH PELVIS - 4+ VIEW  COMPARISON:  None.  FINDINGS: Impacted fracture involving the subcapital left femoral neck. No fractures elsewhere involving the bony pelvis or the proximal right femur.  Joint spaces well preserved in both hips. Bone mineral density well-preserved. Sacroiliac joints intact with degenerative changes. Symphysis pubis intact. Visualized lower lumbar spine intact.  IMPRESSION: Impacted fracture involving the subcapital left femoral neck.   Electronically Signed   By: Evangeline Dakin M.D.   On: 11/26/2013 10:27   Dg Pelvis Portable  11/26/2013   CLINICAL DATA:  Status post left hip replacement for  fracture.  EXAM: PORTABLE PELVIS 1-2 VIEWS  COMPARISON:  Plain film 11/26/2013 at 10:10 a.m.  FINDINGS: New bipolar left hip hemiarthroplasty is in place. The device is located and there is no fracture. Gas in the soft tissues from surgery and surgical staples are noted.  IMPRESSION: Left hip replacement without complicating feature.   Electronically Signed   By: Inge Rise M.D.   On: 11/26/2013 21:39    Scheduled Meds: . aspirin EC  325 mg Oral Daily  . docusate sodium  100 mg Oral BID  . enoxaparin (LOVENOX) injection  40 mg Subcutaneous Q24H  . influenza vac split quadrivalent PF  0.5 mL Intramuscular Tomorrow-1000  . levETIRAcetam  500 mg Oral BID  . metoprolol succinate  25 mg Oral Daily  . nicotine  21 mg Transdermal Daily  . pantoprazole  20 mg Oral Daily  . phenytoin  100 mg Oral BID  . simvastatin  20 mg Oral QPM   Continuous Infusions:   Active Problems:   Memory disorder   Hip fracture   Seizure    Time spent:>35 minutes     Kinnie Feil  [facility] Pager [phone]. If 7PM-7AM, please contact night-coverage at [email or web address], password [email or web address] 11/28/2013, 9:42 AM  LOS: 2 days

## 2013-11-28 NOTE — Progress Notes (Signed)
Physical Therapy Treatment Patient Details Name: [name] MRN: [id number] DOB: 05-Feb-1951 Today's Date: 11/28/2013 Time: 4431-5400 PT Time Calculation (min): 25 min  PT Assessment / Plan / Recommendation  History of Present Illness Pt is a 63 y/o male admitted s/p mechanical fall. Per chart review, pt fell while getting out of a car, sustaining a femoral neck fracture. Per pt, he fell inside his home.    PT Comments   Pt limited by pain and ability to follow commands this session. Nursing advised to use lift for back to bed.  Follow Up Recommendations  SNF     Equipment Recommendations  None recommended by PT    Frequency Min 5X/week   Progress towards PT Goals Progress towards PT goals: Progressing toward goals  Plan Current plan remains appropriate    Precautions / Restrictions Precautions Precautions: Fall;Anterior Hip Precaution Comments: pt unable to recall hip precautions or even having hip surgery. Restrictions Weight Bearing Restrictions: Yes LLE Weight Bearing: Weight bearing as tolerated    Mobility  Bed Mobility Overal bed mobility: +2 for physical assistance;Needs Assistance Bed Mobility: Supine to Sit Supine to sit: Mod assist;+2 for physical assistance General bed mobility comments: max multimodal cues needed with get to edge of bed. pt lying back with trunk transition and then reached and held onto foot board to pull him self onto the left hip (on his side) while legs where off the edge of the bed. with max assist and cues able to get pt upright into seated positon at edge of bed. Transfers Overall transfer level: Needs assistance Equipment used: Rolling walker (2 wheeled) Transfers: Sit to/from Stand Sit to Stand: Mod assist;+2 physical assistance General transfer comment: cues for hand placment and ant weight shift to stand. allowed pt to place feet as he needed due to anterior procaution and able to flex hip as needed. max assist with facilitation for  hip/knee flexion to sit down (pt in full stance extension with forward lean on walker and not procession cues to "sit down" over 3-4 minute span). 2 person assist for standing balance at recliner and to get pt seated safely into recliner.                                 Ambulation/Gait Ambulation/Gait assistance: Min assist;+2 safety/equipment Ambulation Distance (Feet): 5 Feet Assistive device: Rolling walker (2 wheeled) Gait Pattern/deviations: Step-to pattern;Antalgic;Trunk flexed Gait velocity: Decreased Gait velocity interpretation: Below normal speed for age/gender General Gait Details: VC's for sequencing and safety awareness. Assist for occasional movement and positioning of RW for advancement of LE's.     Exercises Total Joint Exercises Ankle Circles/Pumps: AROM;Strengthening;Left;10 reps;Supine Quad Sets: AAROM;Strengthening;Left;10 reps;Supine Heel Slides: AAROM;Strengthening;Left;10 reps;Supine Straight Leg Raises: AAROM;Strengthening;Left;10 reps;Supine     PT Goals (current goals can now be found in the care plan section) Acute Rehab PT Goals Patient Stated Goal: Unable to state PT Goal Formulation: With patient Time For Goal Achievement: 12/11/13 Potential to Achieve Goals: Fair  Visit Information  Last PT Received On: 11/28/13 Assistance Needed: +2 History of Present Illness: Pt is a 63 y/o male admitted s/p mechanical fall. Per chart review, pt fell while getting out of a car, sustaining a femoral neck fracture. Per pt, he fell inside his home.     Subjective Data  Patient Stated Goal: Unable to state   Cognition  Cognition Arousal/Alertness: Awake/alert Behavior During Therapy: Restless Overall Cognitive Status: No family/caregiver  present to determine baseline cognitive functioning Area of Impairment: Memory;Attention;Safety/judgement;Awareness;Problem solving Current Attention Level: Focused Memory: Decreased short-term memory;Decreased recall of  precautions Safety/Judgement: Decreased awareness of safety;Decreased awareness of deficits Awareness: Intellectual Problem Solving: Decreased initiation;Difficulty sequencing;Requires verbal cues;Requires tactile cues       End of Session PT - End of Session Equipment Utilized During Treatment: Gait belt Activity Tolerance: Patient limited by pain Patient left: in chair;with call bell/phone within reach Nurse Communication: Mobility status   GP     Willow Ora 11/28/2013, 12:58 PM

## 2013-11-28 NOTE — Progress Notes (Signed)
Subjective: 2 Days Post-Op Procedure(s) (LRB): LEFT HIP HEMIARTHROPLASTY (Left) Patient reports pain as moderate.  Will have difficulty with mobility due to previous medical problems (stroke and seizures).  Objective: Vital signs in last 24 hours: Temp:  [98.1 F (36.7 C)-99.1 F (37.3 C)] 98.7 F (37.1 C) (02/07 0940) Pulse Rate:  [85-108] 92 (02/07 0940) Resp:  [16-19] 18 (02/07 0940) BP: (109-150)/(62-80) 114/72 mmHg (02/07 0940) SpO2:  [95 %-99 %] 97 % (02/07 0940)  Intake/Output from previous day: 02/06 0701 - 02/07 0700 In: 535 [P.O.:535] Out: 1200 [Urine:1200] Intake/Output this shift:     Recent Labs  11/26/13 1230 11/26/13 1231 11/27/13 0405 11/28/13 0340  HGB 13.8 15.0 12.5* 11.5*    Recent Labs  11/27/13 0405 11/28/13 0340  WBC 10.5 10.4  RBC 3.84* 3.56*  HCT 35.7* 32.6*  PLT 131* 108*    Recent Labs  11/27/13 0405 11/28/13 0340  NA 136* 135*  K 4.1 3.6*  CL 99 98  CO2 21 23  BUN 11 9  CREATININE 0.81 0.72  GLUCOSE 95 140*  CALCIUM 8.8 8.3*    Recent Labs  11/26/13 1230  INR 1.09    Intact pulses distally Dorsiflexion/Plantar flexion intact Incision: scant drainage  Assessment/Plan: 2 Days Post-Op Procedure(s) (LRB): LEFT HIP HEMIARTHROPLASTY (Left) Up with therapy WBAT left hip; anterior hip precautions Aspirin only for DVT due to considerable fall risk.  BLACKMAN,CHRISTOPHER Y 11/28/2013, 10:38 AM

## 2013-11-28 NOTE — Discharge Instructions (Signed)
Full weight baring as tolerated left hip. Anterior hip precautions. New daily dry dressing left hip. Can get incision wet in the shower in 7 days. Staples to stay in 14 days.

## 2013-11-29 ENCOUNTER — Inpatient Hospital Stay (HOSPITAL_COMMUNITY): Payer: Medicare Other

## 2013-11-29 DIAGNOSIS — I6330 Cerebral infarction due to thrombosis of unspecified cerebral artery: Secondary | ICD-10-CM

## 2013-11-29 LAB — CBC
HCT: 32.5 % — ABNORMAL LOW (ref 39.0–52.0)
Hemoglobin: 11.4 g/dL — ABNORMAL LOW (ref 13.0–17.0)
MCH: 32.4 pg (ref 26.0–34.0)
MCHC: 35.1 g/dL (ref 30.0–36.0)
MCV: 92.3 fL (ref 78.0–100.0)
PLATELETS: 112 10*3/uL — AB (ref 150–400)
RBC: 3.52 MIL/uL — ABNORMAL LOW (ref 4.22–5.81)
RDW: 12.5 % (ref 11.5–15.5)
WBC: 7.4 10*3/uL (ref 4.0–10.5)

## 2013-11-29 MED ORDER — AMOXICILLIN-POT CLAVULANATE 875-125 MG PO TABS
1.0000 | ORAL_TABLET | Freq: Two times a day (BID) | ORAL | Status: DC
  Administered 2013-11-29 – 2013-11-30 (×3): 1 via ORAL
  Filled 2013-11-29 (×4): qty 1

## 2013-11-29 NOTE — Progress Notes (Signed)
Clinical Social Work Department BRIEF PSYCHOSOCIAL ASSESSMENT 11/29/2013  Patient:  [name], [name]     Account Number:  [id number]     Admit date:  11/26/2013  Clinical Social Worker:  Rolinda Roan  Date/Time:  11/29/2013 05:36 PM  Referred by:  Physician  Date Referred:  11/28/2013 Referred for  SNF Placement   Other Referral:   Interview type:  Family Other interview type:    PSYCHOSOCIAL DATA Living Status:  [place] Admitted from facility:   Level of care:   Primary support name:  [name] [phone] Primary support relationship to patient:  S[place]LING Degree of support available:   Very supportive patient lives with sister [name].    CURRENT CONCERNS  Other Concerns:    SOCIAL WORK ASSESSMENT / PLAN Clinical Social Worker (CSW) met with patient's sister's [name] and [name] who were at bedside. Per chart patient is not alert and oriented. Sisters are agreeable to SNF search in [facility] and want [facility]. Patient has a history of alcohol abuse and Sister [name] reported that patient has not had a drink in 5 to 10 years.   Assessment/plan status:  Psychosocial Support/Ongoing Assessment of Needs Other assessment/ plan:   Information/referral to community resources:   CSW gave patient SNF list.    PATIENT'S/FAMILY'S RESPONSE TO PLAN OF CARE: Both sisters thanked CSW for visit and starting SNF placement.

## 2013-11-29 NOTE — Progress Notes (Signed)
Pt stable Leg lengths ok Df left ok Mobilize and placement

## 2013-11-29 NOTE — Progress Notes (Signed)
[facility] PROGRESS NOTE  MILLEDGE GERDING EUM:[id number] DOB: [birth year] DOA: 11/26/2013 PCP: Philis Fendt, MD  Assessment/Plan: 63 y.o. male with PMH significant for Seizure disorder, Stroke, gait disorder who presents after a mechanical fall and hip fracture  Active Problems:   1. left Hip Fracture;  -s/p Left hip hemiarthroplasty on 2/5 stable; per ortho  2. Seizure; continue with keppra and dilantin.  3. HTN, Continue with metoprolol.  4. History of stroke; resume aspirin post surgery.  5. Suspected COPD/smoker; CXR: no clear infiltrates; mild congestion; no s/s of fluid overload; echo: LVEF: 55% -stop smoking, cont prn bronchodilators   -fever 2/7 with mild cough; previous CXR no infiltrate, no leukocytosis, UA unremarkable; recheck CXR, UA, start atx for COPD/bronchitis   SNF when bed is available   Code Status: full Family Communication: d/w patient, called updated sister [phone], [phone]  (indicate person spoken with, relationship, and if by phone, the number) Disposition Plan: snf   Consultants:  ortho  Procedures:  S/p L hip   Antibiotics:  None  (indicate start date, and stop date if known)  HPI/Subjective: alert  Objective: Filed Vitals:   11/29/13 0813  BP:   Pulse:   Temp: 99.1 F (37.3 C)  Resp:     Intake/Output Summary (Last 24 hours) at 11/29/13 0854 Last data filed at 11/28/13 1700  Gross per 24 hour  Intake    360 ml  Output    400 ml  Net    -40 ml   There were no vitals filed for this visit.  Exam:   General:  alert  Cardiovascular: s1,s2 rrr  Respiratory: CTA BL  Abdomen: soft, nt, nd   Musculoskeletal: no LE edema   Data Reviewed: Basic Metabolic Panel:  Recent Labs Lab 11/26/13 1230 11/26/13 1231 11/27/13 0405 11/28/13 0340  NA 143 141 136* 135*  K 4.2 3.9 4.1 3.6*  CL 106 104 99 98  CO2 23  --  21 23  GLUCOSE 105* 109* 95 140*  BUN 11 11 11 9   CREATININE 0.90 0.90 0.81 0.72  CALCIUM 9.4  --  8.8  8.3*   Liver Function Tests:  Recent Labs Lab 11/26/13 1230  AST 17  ALT 17  ALKPHOS 146*  BILITOT 0.8  PROT 7.5  ALBUMIN 3.8   No results found for this basename: LIPASE, AMYLASE,  in the last 168 hours No results found for this basename: AMMONIA,  in the last 168 hours CBC:  Recent Labs Lab 11/26/13 1230 11/26/13 1231 11/27/13 0405 11/28/13 0340 11/29/13 0500  WBC 9.9  --  10.5 10.4 7.4  NEUTROABS 7.9*  --   --   --   --   HGB 13.8 15.0 12.5* 11.5* 11.4*  HCT 38.8* 44.0 35.7* 32.6* 32.5*  MCV 91.9  --  93.0 91.6 92.3  PLT 145*  --  131* 108* 112*   Cardiac Enzymes:  Recent Labs Lab 11/26/13 1337  TROPONINI <0.30   BNP (last 3 results)  Recent Labs  11/26/13 1324  PROBNP 122.9   CBG: No results found for this basename: GLUCAP,  in the last 168 hours  No results found for this or any previous visit (from the past 240 hour(s)).   Studies: No results found.  Scheduled Meds: . aspirin EC  325 mg Oral Daily  . docusate sodium  100 mg Oral BID  . enoxaparin (LOVENOX) injection  40 mg Subcutaneous Q24H  . levETIRAcetam  500 mg Oral BID  . metoprolol  succinate  25 mg Oral Daily  . nicotine  21 mg Transdermal Daily  . pantoprazole  20 mg Oral Daily  . phenytoin  100 mg Oral BID  . simvastatin  20 mg Oral QPM   Continuous Infusions:   Active Problems:   Memory disorder   Hip fracture   Seizure    Time spent:>35 minutes     Kinnie Feil  [facility] Pager [phone]. If 7PM-7AM, please contact night-coverage at [email or web address], password [facility] 11/29/2013, 8:54 AM  LOS: 3 days

## 2013-11-29 NOTE — Progress Notes (Addendum)
Clinical Social Work Department CLINICAL SOCIAL WORK PLACEMENT NOTE 11/29/2013  Patient:  [name], [name]  Account Number:  [id number] Admit date:  11/26/2013  Clinical Social Worker:  Blima Rich, Latanya Presser  Date/time:  11/29/2013 05:49 PM  Clinical Social Work is seeking post-discharge placement for this patient at the following level of care:   [place]   (*CSW will update this form in Epic as items are completed)   11/29/2013  Patient/family provided with [place] Department of Clinical Social Work's list of facilities offering this level of care within the geographic area requested by the patient (or if unable, by the patient's family).  11/29/2013  Patient/family informed of their freedom to choose among providers that offer the needed level of care, that participate in Medicare, Medicaid or managed care program needed by the patient, have an available bed and are willing to accept the patient.  11/29/2013  Patient/family informed of [facility]' ownership interest in [facility], as well as of the fact that they are under no obligation to receive care at this facility.  PASARR submitted to EDS on 11/29/2013 PASARR number received from EDS on 11/29/2013  FL2 transmitted to all facilities in geographic area requested by pt/family on  11/29/2013 FL2 transmitted to all facilities within larger geographic area on   Patient informed that his/her managed care company has contracts with or will negotiate with  certain facilities, including the following:     Patient/family informed of bed offers received:  12/01/2013 Patient chooses bed at [facility] Physician recommends and patient chooses bed at    Patient to be transferred to  on  12/01/2013 Patient to be transferred to facility by  [facility]  The following physician request were entered in Epic:   Additional Comments:

## 2013-11-30 DIAGNOSIS — J189 Pneumonia, unspecified organism: Secondary | ICD-10-CM

## 2013-11-30 LAB — INFLUENZA PANEL BY PCR (TYPE A & B)
H1N1FLUPCR: NOT DETECTED
Influenza A By PCR: NEGATIVE
Influenza B By PCR: NEGATIVE

## 2013-11-30 LAB — GLUCOSE, CAPILLARY: Glucose-Capillary: 113 mg/dL — ABNORMAL HIGH (ref 70–99)

## 2013-11-30 MED ORDER — PIPERACILLIN-TAZOBACTAM 3.375 G IVPB
3.3750 g | Freq: Three times a day (TID) | INTRAVENOUS | Status: DC
  Administered 2013-11-30 – 2013-12-01 (×4): 3.375 g via INTRAVENOUS
  Filled 2013-11-30 (×6): qty 50

## 2013-11-30 MED ORDER — VANCOMYCIN HCL IN DEXTROSE 750-5 MG/150ML-% IV SOLN
750.0000 mg | Freq: Three times a day (TID) | INTRAVENOUS | Status: DC
  Administered 2013-11-30 – 2013-12-01 (×4): 750 mg via INTRAVENOUS
  Filled 2013-11-30 (×6): qty 150

## 2013-11-30 NOTE — Care Management Note (Signed)
CARE MANAGEMENT NOTE 11/30/2013  Patient:  [name], [name]   Account Number:  [id number]  Date Initiated:  11/30/2013  Documentation initiated by:  [facility]  Subjective/Objective Assessment:   admitted with left hip fracture,s/p lt hip hemi-arthroplasty     Action/Plan:   Pt/Ot evals-recommended SNF   Anticipated DC Date:  12/01/2013   Anticipated DC Plan:  SKILLED NURSING FACILITY  In-house referral  Clinical Social Worker      DC Planning Services  CM consult      Choice offered to / List presented to:             Status of service:  Completed, signed off Medicare Important Message given?   (If response is "NO", the following Medicare IM given date fields will be blank) Date Medicare IM given:   Date Additional Medicare IM given:    Discharge Disposition:  [place]

## 2013-11-30 NOTE — Progress Notes (Signed)
Physical Therapy Treatment Patient Details Name: [name] MRN: [id number] DOB: 03/21/1951 Today's Date: 11/30/2013 Time: 1027-1050 PT Time Calculation (min): 23 min  PT Assessment / Plan / Recommendation  History of Present Illness Pt is a 63 y/o male admitted s/p mechanical fall. Per chart review, pt fell while getting out of a car, sustaining a femoral neck fracture. Per pt, he fell inside his home.    PT Comments   Making progress with mobility and activity tolerance; Needs very close supervision with mobility to adhere to Anterior hip prec  Follow Up Recommendations  SNF     Does the patient have the potential to tolerate intense rehabilitation     Barriers to Discharge        Equipment Recommendations  None recommended by PT    Recommendations for Other Services    Frequency Min 5X/week   Progress towards PT Goals Progress towards PT goals: Progressing toward goals  Plan Current plan remains appropriate    Precautions / Restrictions Precautions Precautions: Fall;Anterior Hip Precaution Comments: pt unable to recall hip precautions or even having hip surgery. Restrictions Weight Bearing Restrictions: Yes LLE Weight Bearing: Weight bearing as tolerated   Pertinent Vitals/Pain no apparent distress     Mobility  Bed Mobility Overal bed mobility: Needs Assistance Bed Mobility: Supine to Sit Supine to sit: Mod assist General bed mobility comments: Employed simple, step-by-step verbal cues with main cue of keep feet togather to keep anterior hip prec with getting up; required mod assist handheld to pull to sit Transfers Overall transfer level: Needs assistance Equipment used: Rolling walker (2 wheeled) Transfers: Sit to/from Stand Sit to Stand: Mod assist General transfer comment: cues for hand placment and ant weight shift to stand. allowed pt to place feet as he needed due to anterior procaution and able to flex hip as needed. max assist with facilitation for  hip/knee flexion to sit down  Ambulation/Gait Ambulation/Gait assistance: Min assist Ambulation Distance (Feet): 35 Feet Assistive device: Rolling walker (2 wheeled) Gait Pattern/deviations: Decreased stride length Gait velocity: Decreased General Gait Details: VC's for sequencing and safety awareness. Assist for occasional movement and positioning of RW for advancement of LE's. Needed near constant cueing to keep toes pointed where we were going and to keep from stepping through; Pt is unable to retain and apply Anterior Hip Prec    Exercises     PT Diagnosis:    PT Problem List:   PT Treatment Interventions:     PT Goals (current goals can now be found in the care plan section) Acute Rehab PT Goals Patient Stated Goal: Unable to state PT Goal Formulation: With patient Time For Goal Achievement: 12/11/13 Potential to Achieve Goals: Good  Visit Information  Last PT Received On: 11/30/13 Assistance Needed: +1 History of Present Illness: Pt is a 63 y/o male admitted s/p mechanical fall. Per chart review, pt fell while getting out of a car, sustaining a femoral neck fracture. Per pt, he fell inside his home.     Subjective Data  Subjective: Really wanting to get OOB Patient Stated Goal: Unable to state   Cognition  Cognition Arousal/Alertness: Awake/alert Behavior During Therapy: WFL for tasks assessed/performed Overall Cognitive Status: No family/caregiver present to determine baseline cognitive functioning Memory: Decreased short-term memory;Decreased recall of precautions Safety/Judgement: Decreased awareness of safety;Decreased awareness of deficits    Balance     End of Session PT - End of Session Equipment Utilized During Treatment: Gait belt Activity Tolerance: Patient tolerated  treatment well Patient left: in chair;with call bell/phone within reach Nurse Communication: Mobility status;Other (comment) (Could not find chair alarm on floor, so wil use camera)   GP      Quin Hoop [place], [place]  11/30/2013, 12:36 PM

## 2013-11-30 NOTE — Progress Notes (Signed)
[facility] PROGRESS NOTE  [name] NTI:[id number] DOB: 1951-06-07 DOA: 11/26/2013 PCP: Philis Fendt, MD  Assessment/Plan: 63 y.o. male with PMH significant for Seizure disorder, Stroke, gait disorder who presents after a mechanical fall and hip fracture  Active Problems:   1. left Hip Fracture;  -s/p Left hip hemiarthroplasty on 2/5 stable; per ortho  2. Seizure; continue with keppra and dilantin.  3. HTN, Continue with metoprolol.  4. History of stroke; resume aspirin post surgery.  5. Suspected COPD/smoker; no s/s of fluid overload; echo: LVEF: 55% -stop smoking, cont prn bronchodilators   6. fever 2/7 with mild cough; previous CXR no infiltrate, no leukocytosis, UA unremarkable;  -repeat CXR: L sided infiltrates; pneumonia, started IV atx;  SNF in 24-48 hours   Code Status: full Family Communication: d/w patient, called updated sister [phone], [phone]  (indicate person spoken with, relationship, and if by phone, the number) Disposition Plan: snf   Consultants:  ortho  Procedures:  S/p L hip   Antibiotics:  None  (indicate start date, and stop date if known)  HPI/Subjective: alert  Objective: Filed Vitals:   11/30/13 0603  BP: 126/72  Pulse: 79  Temp: 98.7 F (37.1 C)  Resp: 17    Intake/Output Summary (Last 24 hours) at 11/30/13 1023 Last data filed at 11/30/13 0836  Gross per 24 hour  Intake    440 ml  Output      0 ml  Net    440 ml   Filed Weights   11/29/13 1100  Weight: 75 kg (165 lb 5.5 oz)    Exam:   General:  alert  Cardiovascular: s1,s2 rrr  Respiratory: CTA BL  Abdomen: soft, nt, nd   Musculoskeletal: no LE edema   Data Reviewed: Basic Metabolic Panel:  Recent Labs Lab 11/26/13 1230 11/26/13 1231 11/27/13 0405 11/28/13 0340  NA 143 141 136* 135*  K 4.2 3.9 4.1 3.6*  CL 106 104 99 98  CO2 23  --  21 23  GLUCOSE 105* 109* 95 140*  BUN 11 11 11 9   CREATININE 0.90 0.90 0.81 0.72  CALCIUM 9.4  --  8.8  8.3*   Liver Function Tests:  Recent Labs Lab 11/26/13 1230  AST 17  ALT 17  ALKPHOS 146*  BILITOT 0.8  PROT 7.5  ALBUMIN 3.8   No results found for this basename: LIPASE, AMYLASE,  in the last 168 hours No results found for this basename: AMMONIA,  in the last 168 hours CBC:  Recent Labs Lab 11/26/13 1230 11/26/13 1231 11/27/13 0405 11/28/13 0340 11/29/13 0500  WBC 9.9  --  10.5 10.4 7.4  NEUTROABS 7.9*  --   --   --   --   HGB 13.8 15.0 12.5* 11.5* 11.4*  HCT 38.8* 44.0 35.7* 32.6* 32.5*  MCV 91.9  --  93.0 91.6 92.3  PLT 145*  --  131* 108* 112*   Cardiac Enzymes:  Recent Labs Lab 11/26/13 1337  TROPONINI <0.30   BNP (last 3 results)  Recent Labs  11/26/13 1324  PROBNP 122.9   CBG: No results found for this basename: GLUCAP,  in the last 168 hours  No results found for this or any previous visit (from the past 240 hour(s)).   Studies: Dg Chest Port 1 View  11/29/2013   CLINICAL DATA:  Cough and fever  EXAM: PORTABLE CHEST - 1 VIEW  COMPARISON:  November 26, 2013  FINDINGS: There is a small area of infiltrate in  the medial left base. The lungs are otherwise clear. Heart is borderline prominent with normal pulmonary vascularity. No adenopathy. No bone lesions.  IMPRESSION: Patchy infiltrate medial left base.   Electronically Signed   By: Lowella Grip M.D.   On: 11/29/2013 14:24    Scheduled Meds: . aspirin EC  325 mg Oral Daily  . docusate sodium  100 mg Oral BID  . enoxaparin (LOVENOX) injection  40 mg Subcutaneous Q24H  . levETIRAcetam  500 mg Oral BID  . metoprolol succinate  25 mg Oral Daily  . nicotine  21 mg Transdermal Daily  . pantoprazole  20 mg Oral Daily  . phenytoin  100 mg Oral BID  . simvastatin  20 mg Oral QPM   Continuous Infusions:   Active Problems:   Memory disorder   Hip fracture   Seizure    Time spent:>35 minutes     Kinnie Feil  [facility] Pager [phone]. If 7PM-7AM, please contact night-coverage  at [email or web address], password [facility] 11/30/2013, 10:23 AM  LOS: 4 days

## 2013-11-30 NOTE — Progress Notes (Signed)
ANTIBIOTIC CONSULT NOTE - INITIAL  Pharmacy Consult for Vancomycin, Zosyn Indication: pneumonia  No Known Allergies  Patient Measurements: Height: 5\' 9"  (175.3 cm) Weight: 165 lb 5.5 oz (75 kg) (estimated ) IBW/kg (Calculated) : 70.7 Adjusted Body Weight: n/a  Vital Signs: Temp: 98.7 F (37.1 C) (02/09 0603) BP: 126/72 mmHg (02/09 0603) Pulse Rate: 79 (02/09 0603) Intake/Output from previous day: 02/08 0701 - 02/09 0700 In: 200 [P.O.:200] Out: -  Intake/Output from this shift: Total I/O In: 240 [P.O.:240] Out: -   Labs:  Recent Labs  11/28/13 0340 11/29/13 0500  WBC 10.4 7.4  HGB 11.5* 11.4*  PLT 108* 112*  CREATININE 0.72  --    Estimated Creatinine Clearance: 97 ml/min (by C-G formula based on Cr of 0.72). No results found for this basename: VANCOTROUGH, VANCOPEAK, VANCORANDOM, GENTTROUGH, GENTPEAK, GENTRANDOM, TOBRATROUGH, TOBRAPEAK, TOBRARND, AMIKACINPEAK, AMIKACINTROU, AMIKACIN,  in the last 72 hours   Microbiology: No results found for this or any previous visit (from the past 720 hour(s)).  Medical History: Past Medical History  Diagnosis Date  . History of alcoholism   . Hx of ischemic vertebrobasilar artery thalamic stroke     Right  . Gait disorder   . Dementia   . 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 Seizures   . Memory disorder 03/04/2013  . Gait disorder   . Stroke     Right frontal, right thalamic  . Ulnar neuropathy of left upper extremity   . Alcoholism     History of, not active    Medications:  Prescriptions prior to admission  Medication Sig Dispense Refill  . Cholecalciferol (VITAMIN D-3) 1000 UNITS CAPS Take 1 capsule by mouth daily.      Marland Kitchen levETIRAcetam (KEPPRA) 500 MG tablet Take 500 mg by mouth 2 (two) times daily.      . metoprolol succinate (TOPROL-XL) 25 MG 24 hr tablet Take 25 mg by mouth daily.      . phenytoin (DILANTIN) 100 MG ER capsule Take 1 capsule (100 mg total) by mouth 2 (two) times daily.  180  capsule  1  . simvastatin (ZOCOR) 20 MG tablet Take 20 mg by mouth every evening.      . [DISCONTINUED] aspirin 81 MG tablet Take 81 mg by mouth daily.       Assessment: 5 YOM with presents with left hip fracture after fall. S/p left hip heimarthroplasty. Pharmacy to start zosyn and vancomycin for pneumonia. CXR shows L sided infiltrates. WBC wnl, Max temp 101.7 F, CrCl ~ 97 mL/min. No cultures.   Goal of Therapy:  Vancomycin trough level 15-20 mcg/ml  Plan:  1) Vancomycin 750 mg IV Q 8 hours  2) Zosyn 3.375 gm IV Q 8 hours  3) Monitor CBC, renal fx, and patient clinical status 4) Vanc levels as necessary   Albertina Parr, PharmD.  Clinical Pharmacist Pager [phone]

## 2013-11-30 NOTE — Progress Notes (Signed)
OT Cancellation Note and Discharge  Patient Details Name: [name] MRN: [id number] DOB: 1951-09-14   Cancelled Treatment:    Reason Eval/Treat Not Completed: Other (comment). Pt is for SNF level of therapies and per chart pt is Medicare/Medicaid--will defer OT eval to SNF. If eval is needed for SNF placement please call [phone] or do an all OT text page using [email or web address] (changes to [place] automatically) [phone].  Almon Register [phone] 11/30/2013, 8:25 AM

## 2013-12-01 MED ORDER — AMOXICILLIN-POT CLAVULANATE 875-125 MG PO TABS: 1.0000 | ORAL_TABLET | Freq: Two times a day (BID) | ORAL | Status: AC

## 2013-12-01 MED ORDER — PANTOPRAZOLE SODIUM 20 MG PO TBEC: 20.0000 mg | DELAYED_RELEASE_TABLET | Freq: Every day | ORAL | Status: DC

## 2013-12-01 MED ORDER — NICOTINE 21 MG/24HR TD PT24: 21.0000 mg | MEDICATED_PATCH | Freq: Every day | TRANSDERMAL | Status: DC

## 2013-12-01 MED ORDER — ALBUTEROL SULFATE (2.5 MG/3ML) 0.083% IN NEBU: 2.5000 mg | INHALATION_SOLUTION | RESPIRATORY_TRACT | Status: DC | PRN

## 2013-12-01 NOTE — Progress Notes (Signed)
Physical Therapy Treatment Patient Details Name: EAN GETTEL MRN: [id number] DOB: 1950-10-25 Today's Date: 12/01/2013 Time: 0825-0850 PT Time Calculation (min): 25 min  PT Assessment / Plan / Recommendation  History of Present Illness Pt is a 63 y/o male admitted s/p mechanical fall. Per chart review, pt fell while getting out of a car, sustaining a femoral neck fracture. Per pt, he fell inside his home.    PT Comments   Pt able to increase gait distance, continues with difficulty maintaining anterior hip precautions during gait and mobility.  Follow Up Recommendations  SNF     Does the patient have the potential to tolerate intense rehabilitation     Barriers to Discharge        Equipment Recommendations  None recommended by PT    Recommendations for Other Services    Frequency Min 5X/week   Progress towards PT Goals Progress towards PT goals: Progressing toward goals  Plan Current plan remains appropriate    Precautions / Restrictions Precautions Precautions: Fall;Anterior Hip Precaution Comments: pt unable to recall hip precautions or even having hip surgery. Restrictions LLE Weight Bearing: Weight bearing as tolerated   Pertinent Vitals/Pain No c/o pain    Mobility  Bed Mobility Bed Mobility: Supine to Sit Supine to sit: Min assist General bed mobility comments: step by step instructions, assist to maintain anterior hip precautions Transfers Overall transfer level: Needs assistance Equipment used: Rolling walker (2 wheeled) Sit to Stand: Mod assist General transfer comment: lifting assist, increased time, cues for hand placement. requires increase time and manual facilitaiton at hips to sit Ambulation/Gait Ambulation/Gait assistance: Min assist Ambulation Distance (Feet): 50 Feet Assistive device: Rolling walker (2 wheeled) General Gait Details: cues to keep hip in neutral alignment and prevent ER, cues for step through pattern, decreased cadence    Exercises  General Exercises - Lower Extremity Ankle Circles/Pumps: AROM;10 reps Quad Sets: AAROM;10 reps;Left Other Exercises Other Exercises: AAROM L hip IR to prevent breaking anterior hip precautions, 10x   PT Diagnosis:    PT Problem List:   PT Treatment Interventions:     PT Goals (current goals can now be found in the care plan section)    Visit Information  Last PT Received On: 12/01/13 Assistance Needed: +1 History of Present Illness: Pt is a 63 y/o male admitted s/p mechanical fall. Per chart review, pt fell while getting out of a car, sustaining a femoral neck fracture. Per pt, he fell inside his home.     Subjective Data      Cognition  Cognition Arousal/Alertness: Awake/alert Overall Cognitive Status: No family/caregiver present to determine baseline cognitive functioning Current Attention Level: Sustained Memory: Decreased recall of precautions;Decreased short-term memory Safety/Judgement: Decreased awareness of safety;Decreased awareness of deficits Awareness: Intellectual Problem Solving: Slow processing;Decreased initiation;Difficulty sequencing;Requires verbal cues;Requires tactile cues    Balance     End of Session PT - End of Session Equipment Utilized During Treatment: Gait belt Activity Tolerance: Patient tolerated treatment well Patient left: in chair;with call bell/phone within reach Nurse Communication: Mobility status (RN aware to use camera since no chair alarm available)   GP     [place] Korver 12/01/2013, 11:23 AM

## 2013-12-01 NOTE — Discharge Summary (Signed)
Physician Discharge Summary  [name] KVQ:[id number] DOB: 16-Apr-1951 DOA: 11/26/2013  PCP: Philis Fendt, MD  Admit date: 11/26/2013 Discharge date: 12/01/2013  Time spent: >35 minutes  Recommendations for Outpatient Follow-up:  F/U WITH ORTHOPEDICS AS SCHEDULED  F/u with PCP in 1-2 weeks post rehab  Discharge Diagnoses:  Active Problems:   Memory disorder   Hip fracture   Seizure   Discharge Condition: stable   Diet recommendation: heart healthy   Filed Weights   11/29/13 1100  Weight: 75 kg (165 lb 5.5 oz)    History of present illness:  63 y.o. male with PMH significant for Seizure disorder, Stroke, gait disorder who presents after a mechanical fall and hip fracture   Hospital Course:  Active Problems:  1. left Hip Fracture;  -s/p Left hip hemiarthroplasty on 2/5 stable; per ortho ASA for dvt prophylaxis due to significant risk of fall, bleeding  -f/u with ortho as scheduled 2. Seizure; continue with keppra and dilantin.  3. HTN, Continue with metoprolol.  4. History of stroke; resume aspirin post surgery.  5. Suspected COPD/smoker; no s/s of fluid overload; echo: LVEF: 55%  -stop smoking, cont prn bronchodilators, needs outpatient PFTs  6. fever 2/7 with mild cough; no leukocytosis, UA unremarkable;  -repeat CXR: L sided infiltrates; probable pneumonia, started IV atx, afebrile >48 hours change to Po atx 5 days   -need outpatient follow up CXR in 4-6 weeks      Procedures:  L hip  (i.e. Studies not automatically included, echos, thoracentesis, etc; not x-rays)  Consultations:  ortho  Discharge Exam: Filed Vitals:   12/01/13 0532  BP: 119/74  Pulse: 80  Temp: 98.3 F (36.8 C)  Resp: 18    General: alert Cardiovascular: s1,s2 rrr Respiratory: CTA BL  Discharge Instructions  Discharge Orders   Future Appointments Provider Department Dept Phone   03/04/2014 11:00 AM Kathrynn Ducking, MD [facility] [phone]   Future  Orders Complete By Expires   Diet - low sodium heart healthy  As directed    Discharge instructions  As directed    Comments:     Please follow up with orthopedics as scheduled   Full weight bearing  As directed    Scheduling Instructions:     Full weight left hip with assistance. Anterior hip precautions.   Questions:     Laterality:  left   Extremity:  Lower   Increase activity slowly  As directed        Medication List    STOP taking these medications       aspirin 81 MG tablet  Replaced by:  aspirin 325 MG EC tablet      TAKE these medications       albuterol (2.5 MG/3ML) 0.083% nebulizer solution  Commonly known as:  PROVENTIL  Take 3 mLs (2.5 mg total) by nebulization every 4 (four) hours as needed for wheezing or shortness of breath.     amoxicillin-clavulanate 875-125 MG per tablet  Commonly known as:  AUGMENTIN  Take 1 tablet by mouth 2 (two) times daily.     aspirin 325 MG EC tablet  Take 1 tablet (325 mg total) by mouth 2 (two) times daily after a meal.     HYDROcodone-acetaminophen 5-325 MG per tablet  Commonly known as:  NORCO/VICODIN  Take 1-2 tablets by mouth every 6 (six) hours as needed for moderate pain.     levETIRAcetam 500 MG tablet  Commonly known as:  KEPPRA  Take  500 mg by mouth 2 (two) times daily.     metoprolol succinate 25 MG 24 hr tablet  Commonly known as:  TOPROL-XL  Take 25 mg by mouth daily.     nicotine 21 mg/24hr patch  Commonly known as:  NICODERM CQ - dosed in mg/24 hours  Place 1 patch (21 mg total) onto the skin daily.     pantoprazole 20 MG tablet  Commonly known as:  PROTONIX  Take 1 tablet (20 mg total) by mouth daily.     phenytoin 100 MG ER capsule  Commonly known as:  DILANTIN  Take 1 capsule (100 mg total) by mouth 2 (two) times daily.     simvastatin 20 MG tablet  Commonly known as:  ZOCOR  Take 20 mg by mouth every evening.     Vitamin D-3 1000 UNITS Caps  Take 1 capsule by mouth daily.       No  Known Allergies     Follow-up Information   Follow up with Mcarthur Rossetti, MD. Schedule an appointment as soon as possible for a visit in 2 weeks.   Specialty:  Orthopedic Surgery   Contact information:   [place] [address] [phone]       Follow up with Nolene Ebbs A, MD In 1 week.   Specialty:  Internal Medicine   Contact information:   [place] [address] [phone]        The results of significant diagnostics from this hospitalization (including imaging, microbiology, ancillary and laboratory) are listed below for reference.    Significant Diagnostic Studies: Dg Chest 1 View  11/26/2013   CLINICAL DATA:  Preop radiograph.  Fall  EXAM: CHEST - 1 VIEW  COMPARISON:  05/16/2010  FINDINGS: The heart size and mediastinal contours are within normal limits. Pulmonary vascular congestion is noted. No overt edema or effusion identified. Both lungs are clear. The visualized skeletal structures are unremarkable.  IMPRESSION: 1. Pulmonary vascular congestion. 2. Examination is otherwise unremarkable.   Electronically Signed   By: Kerby Moors M.D.   On: 11/26/2013 11:42   Dg Hip Bilateral W/pelvis  11/26/2013   CLINICAL DATA:  Fall.  Severe left hip pain.  EXAM: BILATERAL HIP WITH PELVIS - 4+ VIEW  COMPARISON:  None.  FINDINGS: Impacted fracture involving the subcapital left femoral neck. No fractures elsewhere involving the bony pelvis or the proximal right femur.  Joint spaces well preserved in both hips. Bone mineral density well-preserved. Sacroiliac joints intact with degenerative changes. Symphysis pubis intact. Visualized lower lumbar spine intact.  IMPRESSION: Impacted fracture involving the subcapital left femoral neck.   Electronically Signed   By: Evangeline Dakin M.D.   On: 11/26/2013 10:27   Dg Pelvis Portable  11/26/2013   CLINICAL DATA:  Status post left hip replacement for fracture.  EXAM: PORTABLE PELVIS 1-2 VIEWS   COMPARISON:  Plain film 11/26/2013 at 10:10 a.m.  FINDINGS: New bipolar left hip hemiarthroplasty is in place. The device is located and there is no fracture. Gas in the soft tissues from surgery and surgical staples are noted.  IMPRESSION: Left hip replacement without complicating feature.   Electronically Signed   By: Inge Rise M.D.   On: 11/26/2013 21:39   Dg Chest Port 1 View  11/29/2013   CLINICAL DATA:  Cough and fever  EXAM: PORTABLE CHEST - 1 VIEW  COMPARISON:  November 26, 2013  FINDINGS: There is a small area of infiltrate in the medial left  base. The lungs are otherwise clear. Heart is borderline prominent with normal pulmonary vascularity. No adenopathy. No bone lesions.  IMPRESSION: Patchy infiltrate medial left base.   Electronically Signed   By: Lowella Grip M.D.   On: 11/29/2013 14:24    Microbiology: No results found for this or any previous visit (from the past 240 hour(s)).   Labs: Basic Metabolic Panel:  Recent Labs Lab 11/26/13 1230 11/26/13 1231 11/27/13 0405 11/28/13 0340  NA 143 141 136* 135*  K 4.2 3.9 4.1 3.6*  CL 106 104 99 98  CO2 23  --  21 23  GLUCOSE 105* 109* 95 140*  BUN 11 11 11 9   CREATININE 0.90 0.90 0.81 0.72  CALCIUM 9.4  --  8.8 8.3*   Liver Function Tests:  Recent Labs Lab 11/26/13 1230  AST 17  ALT 17  ALKPHOS 146*  BILITOT 0.8  PROT 7.5  ALBUMIN 3.8   No results found for this basename: LIPASE, AMYLASE,  in the last 168 hours No results found for this basename: AMMONIA,  in the last 168 hours CBC:  Recent Labs Lab 11/26/13 1230 11/26/13 1231 11/27/13 0405 11/28/13 0340 11/29/13 0500  WBC 9.9  --  10.5 10.4 7.4  NEUTROABS 7.9*  --   --   --   --   HGB 13.8 15.0 12.5* 11.5* 11.4*  HCT 38.8* 44.0 35.7* 32.6* 32.5*  MCV 91.9  --  93.0 91.6 92.3  PLT 145*  --  131* 108* 112*   Cardiac Enzymes:  Recent Labs Lab 11/26/13 1337  TROPONINI <0.30   BNP: BNP (last 3 results)  Recent Labs  11/26/13 1324   PROBNP 122.9   CBG:  Recent Labs Lab 11/30/13 2114  GLUCAP 113*       Signed:  Nelton Amsden N  [facility] 12/01/2013, 8:18 AM

## 2013-12-01 NOTE — Progress Notes (Signed)
Patient ID: [name], male   DOB: 02-17-1951, 63 y.o.   MRN: [id number] No acute changes.  Vitals stable.  Awaiting SNF placement.  Left hip stable post-op.

## 2013-12-01 NOTE — Progress Notes (Signed)
Patient d/c to SNF, IV removed, report called to facility.

## 2013-12-01 NOTE — Progress Notes (Signed)
Clinical social worker assisted with patient discharge to skilled nursing facility, [facility].  CSW addressed all family questions and concerns. CSW copied chart and added all important documents. CSW also set up patient transportation with [facility]. Clinical Social Worker will sign off for now as social work intervention is no longer needed.   Brown Dunlap, MSW, LCSWA [phone]  

## 2013-12-25 ENCOUNTER — Emergency Department (HOSPITAL_COMMUNITY): Payer: Medicare Other

## 2013-12-25 ENCOUNTER — Encounter (HOSPITAL_COMMUNITY): Payer: Self-pay | Admitting: Emergency Medicine

## 2013-12-25 ENCOUNTER — Emergency Department (HOSPITAL_COMMUNITY)
Admission: EM | Admit: 2013-12-25 | Discharge: 2013-12-25 | Disposition: A | Discharge status: Home / Self Care | Payer: Medicare Other | Attending: Emergency Medicine | Admitting: Emergency Medicine

## 2013-12-25 DIAGNOSIS — R569 Unspecified convulsions: Secondary | ICD-10-CM

## 2013-12-25 DIAGNOSIS — Z8719 Personal history of other diseases of the digestive system: Secondary | ICD-10-CM | POA: Insufficient documentation

## 2013-12-25 DIAGNOSIS — F0390 Unspecified dementia without behavioral disturbance: Secondary | ICD-10-CM | POA: Insufficient documentation

## 2013-12-25 DIAGNOSIS — F1021 Alcohol dependence, in remission: Secondary | ICD-10-CM | POA: Insufficient documentation

## 2013-12-25 DIAGNOSIS — Z8673 Personal history of transient ischemic attack (TIA), and cerebral infarction without residual deficits: Secondary | ICD-10-CM | POA: Insufficient documentation

## 2013-12-25 DIAGNOSIS — R7989 Other specified abnormal findings of blood chemistry: Secondary | ICD-10-CM | POA: Insufficient documentation

## 2013-12-25 DIAGNOSIS — R7889 Finding of other specified substances, not normally found in blood: Secondary | ICD-10-CM

## 2013-12-25 DIAGNOSIS — G40909 Epilepsy, unspecified, not intractable, without status epilepticus: Secondary | ICD-10-CM | POA: Insufficient documentation

## 2013-12-25 DIAGNOSIS — Z79899 Other long term (current) drug therapy: Secondary | ICD-10-CM | POA: Insufficient documentation

## 2013-12-25 DIAGNOSIS — F17200 Nicotine dependence, unspecified, uncomplicated: Secondary | ICD-10-CM | POA: Insufficient documentation

## 2013-12-25 DIAGNOSIS — Z7982 Long term (current) use of aspirin: Secondary | ICD-10-CM | POA: Insufficient documentation

## 2013-12-25 LAB — COMPREHENSIVE METABOLIC PANEL
ALT: 9 U/L (ref 0–53)
AST: 13 U/L (ref 0–37)
Albumin: 3.5 g/dL (ref 3.5–5.2)
Alkaline Phosphatase: 190 U/L — ABNORMAL HIGH (ref 39–117)
BUN: 12 mg/dL (ref 6–23)
CO2: 28 mEq/L (ref 19–32)
CREATININE: 0.82 mg/dL (ref 0.50–1.35)
Calcium: 9.7 mg/dL (ref 8.4–10.5)
Chloride: 99 mEq/L (ref 96–112)
GFR calc non Af Amer: >90 mL/min (ref >90)
GLUCOSE: 106 mg/dL — AB (ref 70–99)
Potassium: 4.5 mEq/L (ref 3.7–5.3)
Sodium: 137 mEq/L (ref 137–147)
TOTAL PROTEIN: 7.7 g/dL (ref 6.0–8.3)
Total Bilirubin: 0.3 mg/dL (ref 0.3–1.2)

## 2013-12-25 LAB — CBC WITH DIFFERENTIAL/PLATELET
Basophils Absolute: 0 10*3/uL (ref 0.0–0.1)
Basophils Relative: 0 % (ref 0–1)
EOS ABS: 0.1 10*3/uL (ref 0.0–0.7)
Eosinophils Relative: 2 % (ref 0–5)
HCT: 36.5 % — ABNORMAL LOW (ref 39.0–52.0)
HEMOGLOBIN: 12.8 g/dL — AB (ref 13.0–17.0)
Lymphocytes Relative: 27 % (ref 12–46)
Lymphs Abs: 1.3 10*3/uL (ref 0.7–4.0)
MCH: 32.7 pg (ref 26.0–34.0)
MCHC: 35.1 g/dL (ref 30.0–36.0)
MCV: 93.1 fL (ref 78.0–100.0)
MONO ABS: 0.6 10*3/uL (ref 0.1–1.0)
MONOS PCT: 11 % (ref 3–12)
NEUTROS PCT: 60 % (ref 43–77)
Neutro Abs: 3 10*3/uL (ref 1.7–7.7)
Platelets: 181 10*3/uL (ref 150–400)
RBC: 3.92 MIL/uL — ABNORMAL LOW (ref 4.22–5.81)
RDW: 12.6 % (ref 11.5–15.5)
WBC: 4.9 10*3/uL (ref 4.0–10.5)

## 2013-12-25 LAB — PHENYTOIN LEVEL, TOTAL: PHENYTOIN LVL: 8.7 ug/mL — AB (ref 10.0–20.0)

## 2013-12-25 LAB — PROTIME-INR
INR: 0.98 (ref 0.00–1.49)
Prothrombin Time: 12.8 seconds (ref 11.6–15.2)

## 2013-12-25 LAB — APTT: aPTT: 31 seconds (ref 24–37)

## 2013-12-25 LAB — TROPONIN I: Troponin I: <0.3 ng/mL (ref <0.30)

## 2013-12-25 MED ORDER — PHENYTOIN 50 MG PO CHEW
100.0000 mg | CHEWABLE_TABLET | Freq: Once | ORAL | Status: AC
  Administered 2013-12-25: 100 mg via ORAL
  Filled 2013-12-25: qty 2

## 2013-12-25 NOTE — ED Provider Notes (Signed)
CSN: [id number]     Arrival date & time 12/25/13  0714 History   First MD Initiated Contact with Patient 12/25/13 [phone]     Chief Complaint  Patient presents with  . Altered Mental Status   Level V caveat for altered mental status  (Consider location/radiation/quality/duration/timing/severity/associated sxs/prior Treatment) HPI Patient presents emergency department because his nursing home states he was standing in his doorway to his bedroom staring down the hall towards the outside door. Patient does not recall it happening. Patient states he was in his bed and he was awakened to come to the ED. He denies any pain. He states he feels fine. Nursing home staff reported left facial droop to EMS. EMS did not note any facial droop. EMS did note he was dragging his left leg however he did just have total hip replacement done on February 5. Patient has a history of seizures. He states he cannot tell when he has his seizures. He denies any pain.   PCP Dr Garwin Brothers  Past Medical History  Diagnosis Date  . History of alcoholism   . Hx of ischemic vertebrobasilar artery thalamic stroke     Right  . Gait disorder   . Dementia   . 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 Seizures   . Memory disorder 03/04/2013  . Gait disorder   . Stroke     Right frontal, right thalamic  . Ulnar neuropathy of left upper extremity   . Alcoholism     History of, not active   Past Surgical History  Procedure Laterality Date  . Wrist surgery Left 95 & 96  . Hip arthroplasty Left 11/26/2013    Procedure: LEFT HIP HEMIARTHROPLASTY;  Surgeon: Mcarthur Rossetti, MD;  Location: [facility];  Service: Orthopedics;  Laterality: Left;   Family History  Problem Relation Age of Onset  . Pulmonary embolism Mother   . Liver disease Father    History  Substance Use Topics  . Smoking status: Current Every Day Smoker -- 3.00 packs/day  . Smokeless tobacco: Not on file  . Alcohol Use: No     Comment: Former  Alcoholic  lives in NH  Review of Systems  All other systems reviewed and are negative.      Allergies  Review of patient's allergies indicates no known allergies.  Home Medications   Current Outpatient Rx  Name  Route  Sig  Dispense  Refill  . albuterol (PROVENTIL) (2.5 MG/3ML) 0.083% nebulizer solution   Nebulization   Take 2.5 mg by nebulization every 4 (four) hours as needed for wheezing or shortness of breath.         Marland Kitchen aspirin EC 325 MG EC tablet   Oral   Take 1 tablet (325 mg total) by mouth 2 (two) times daily after a meal.   30 tablet   0   . Cholecalciferol (VITAMIN D-3) 1000 UNITS CAPS   Oral   Take 1,000 Units by mouth daily.          Marland Kitchen HYDROcodone-acetaminophen (NORCO/VICODIN) 5-325 MG per tablet   Oral   Take 1-2 tablets by mouth every 6 (six) hours as needed for moderate pain.         Marland Kitchen levETIRAcetam (KEPPRA) 500 MG tablet   Oral   Take 500 mg by mouth 2 (two) times daily.         . Melatonin 5 MG CAPS   Oral   Take 5 mg by mouth  at bedtime as needed (sleep).         . metoprolol tartrate (LOPRESSOR) 25 MG tablet   Oral   Take 25 mg by mouth daily.         . pantoprazole (PROTONIX) 20 MG tablet   Oral   Take 20 mg by mouth daily.         . phenytoin (DILANTIN) 100 MG ER capsule   Oral   Take 100 mg by mouth 2 (two) times daily.         . simvastatin (ZOCOR) 20 MG tablet   Oral   Take 20 mg by mouth every evening.          BP 109/67  Pulse 71  Temp(Src) 98.3 F (36.8 C) (Oral)  Resp 15  SpO2 98%  Vital signs normal   Physical Exam  Nursing note and vitals reviewed. Constitutional: He is oriented to person, place, and time. He appears well-developed and well-nourished.  Non-toxic appearance. He does not appear ill. No distress.  Patient states he feels fine  HENT:  Head: Normocephalic and atraumatic.  Right Ear: External ear normal.  Left Ear: External ear normal.  Nose: Nose normal. No mucosal edema or  rhinorrhea.  Mouth/Throat: Oropharynx is clear and moist and mucous membranes are normal. No dental abscesses or uvula swelling.  Eyes: Conjunctivae and EOM are normal. Pupils are equal, round, and reactive to light.  Neck: Normal range of motion and full passive range of motion without pain. Neck supple.  Cardiovascular: Normal rate, regular rhythm and normal heart sounds.  Exam reveals no gallop and no friction rub.   No murmur heard. Pulmonary/Chest: Effort normal and breath sounds normal. No respiratory distress. He has no wheezes. He has no rhonchi. He has no rales. He exhibits no tenderness and no crepitus.  Abdominal: Soft. Normal appearance and bowel sounds are normal. He exhibits no distension. There is no tenderness. There is no rebound and no guarding.  Musculoskeletal: Normal range of motion. He exhibits no edema and no tenderness.  Moves all extremities well. On range of motion he does complain of pain in his left hip however he just had hip replacement done on February 5  Neurological: He is alert and oriented to person, place, and time. He has normal strength. No cranial nerve deficit.  Patient had trouble with hand grips and that he seemed to ignore his left side. When I stopped testing his right hand and he looked at his left hand he was then able to grip on the left however I could pull my finger out. He is noted to have a large scar on his wrists from a prior injury. He had no pronator drift. Patient had no obvious weakness in his lower extremities.   Skin: Skin is warm, dry and intact. No rash noted. No erythema. No pallor.  Psychiatric: He has a normal mood and affect. His speech is normal and behavior is normal. His mood appears not anxious.    ED Course  Procedures (including critical care time)   Medications  phenytoin (DILANTIN) chewable tablet 100 mg (100 mg Oral Given 12/25/13 1106)   Pt ambulated by staff with minimal assistance, c/w recent hip replacement. He had  no changes in his MS while in the ED.   Labs Review Results for orders placed during the hospital encounter of 12/25/13  APTT      Result Value Ref Range   aPTT 31  24 - 37 seconds  CBC  WITH DIFFERENTIAL      Result Value Ref Range   WBC 4.9  4.0 - 10.5 K/uL   RBC 3.92 (*) 4.22 - 5.81 MIL/uL   Hemoglobin 12.8 (*) 13.0 - 17.0 g/dL   HCT 36.5 (*) 39.0 - 52.0 %   MCV 93.1  78.0 - 100.0 fL   MCH 32.7  26.0 - 34.0 pg   MCHC 35.1  30.0 - 36.0 g/dL   RDW 12.6  11.5 - 15.5 %   Platelets 181  150 - 400 K/uL   Neutrophils Relative % 60  43 - 77 %   Neutro Abs 3.0  1.7 - 7.7 K/uL   Lymphocytes Relative 27  12 - 46 %   Lymphs Abs 1.3  0.7 - 4.0 K/uL   Monocytes Relative 11  3 - 12 %   Monocytes Absolute 0.6  0.1 - 1.0 K/uL   Eosinophils Relative 2  0 - 5 %   Eosinophils Absolute 0.1  0.0 - 0.7 K/uL   Basophils Relative 0  0 - 1 %   Basophils Absolute 0.0  0.0 - 0.1 K/uL  COMPREHENSIVE METABOLIC PANEL      Result Value Ref Range   Sodium 137  137 - 147 mEq/L   Potassium 4.5  3.7 - 5.3 mEq/L   Chloride 99  96 - 112 mEq/L   CO2 28  19 - 32 mEq/L   Glucose, Bld 106 (*) 70 - 99 mg/dL   BUN 12  6 - 23 mg/dL   Creatinine, Ser 0.82  0.50 - 1.35 mg/dL   Calcium 9.7  8.4 - 10.5 mg/dL   Total Protein 7.7  6.0 - 8.3 g/dL   Albumin 3.5  3.5 - 5.2 g/dL   AST 13  0 - 37 U/L   ALT 9  0 - 53 U/L   Alkaline Phosphatase 190 (*) 39 - 117 U/L   Total Bilirubin 0.3  0.3 - 1.2 mg/dL   GFR calc non Af Amer >90  >90 mL/min   GFR calc Af Amer >90  >90 mL/min  PHENYTOIN LEVEL, TOTAL      Result Value Ref Range   Phenytoin Lvl 8.7 (*) 10.0 - 20.0 ug/mL  PROTIME-INR      Result Value Ref Range   Prothrombin Time 12.8  11.6 - 15.2 seconds   INR 0.98  0.00 - 1.49  TROPONIN I      Result Value Ref Range   Troponin I <0.30  <0.30 ng/mL    Laboratory interpretation all normal except subtherapeutic Dilantin level, mild anemia   Imaging Review Ct Head Wo Contrast  12/25/2013   CLINICAL DATA:  Altered  mental status  EXAM: CT HEAD WITHOUT CONTRAST  TECHNIQUE: Contiguous axial images were obtained from the base of the skull through the vertex without intravenous contrast.  COMPARISON:  07/16/2013  FINDINGS: Generalized atrophy is stable. Chronic right parietal infarct with volume loss enlargement of the right ventricle. Mild chronic microvascular ischemia in the white matter.  Negative for acute infarct. Negative for hemorrhage or mass. Calvarium is intact.  IMPRESSION: Atrophy and chronic right parietal infarct.  No acute abnormality.   Electronically Signed   By: Franchot Gallo M.D.   On: 12/25/2013 08:57    Dg Chest 1 View  11/26/2013   CLINICAL DATA:  Preop radiograph.   IMPRESSION: 1. Pulmonary vascular congestion. 2. Examination is otherwise unremarkable.   Electronically Signed   By: Kerby Moors M.D.   On:  11/26/2013 11:42   Dg Hip Bilateral W/pelvis  11/26/2013   CLINICAL DATA:  Fall.  Severe left hip pain.    IMPRESSION: Impacted fracture involving the subcapital left femoral neck.   Electronically Signed   By: Evangeline Dakin M.D.   On: 11/26/2013 10:27   Dg Pelvis Portable  11/26/2013   CLINICAL DATA:  Status post left hip replacement for fracture.   FINDINGS: New bipolar left hip hemiarthroplasty is in place. The device is located and there is no fracture. Gas in the soft tissues from surgery and surgical staples are noted.  IMPRESSION: Left hip replacement without complicating feature.   Electronically Signed   By: Inge Rise M.D.   On: 11/26/2013 21:39   Dg Chest Port 1 View  11/29/2013   CLINICAL DATA:  Cough and fever  .  IMPRESSION: Patchy infiltrate medial left base.   Electronically Signed   By: Lowella Grip M.D.   On: 11/29/2013 14:24     EKG Interpretation   Date/Time:  Friday December 25 2013 07:33:33 EST Ventricular Rate:  71 PR Interval:  188 QRS Duration: 91 QT Interval:  372 QTC Calculation: 404 R Axis:   72 Text Interpretation:  Sinus rhythm ST  elevation, consider inferior injury  Baseline wander in lead(s) V6 No significant change since last tracing 06 Dec 2013 Confirmed by Racquelle Hyser  MD-I, Gowri Suchan ([id number]) on 12/25/2013 8:13:29 AM      MDM  patient presents with some unusual behavior at his nursing home but resolved by the time EMS arrived. Patient has history of seizure disorder subtherapeutic Dilantin level. His tests are normal and his neurological exam was normal except for some weakness in his left hand however he's had major surgery to that arm. Most likely patient had a seizure he was postictal when he was found by the nursing staff. The last thing patient remembers was he was in his bed. This also goes along with a seizure with postictal state. Most acute strokes do not ambulate.   Final diagnoses:  Post-ictal state  Subtherapeutic serum dilantin level    Plan discharge  Rolland Porter, MD, Alanson Aly, MD 12/25/13 [phone]

## 2013-12-25 NOTE — ED Notes (Signed)
Report given to [place]yer LPN, pt alert upon discharge, pt transported back to facility via [facility], no new RX prescribed, staff verbalizes understanding of discharge instructions

## 2013-12-25 NOTE — ED Notes (Signed)
Patient ambulated in room with minimal assistance.

## 2013-12-25 NOTE — ED Notes (Signed)
Patient unable to get urine sample. States that he is incontinent.

## 2013-12-25 NOTE — ED Notes (Signed)
Per [facility] pt from [facility]. Nursing assistant found pt approx 0600 standing at the door way looking out and appeared confused. The oncoming RN felt he may have had a stroke. They do not know when the last time he was normal, staff felt pt had a Left side facial droop, no droop noted for EMS, upon standing pt would drag his Left leg but they are unsure if this is his normal due to they were unable to locate the staff. Pt was able to follow commands. Lift bilateral legs with no difficulty, pt alert to self and location. VS BP 128/82, HR 75, 98% RA, CBG 120, 98.4 temp, RR 16

## 2013-12-25 NOTE — ED Notes (Signed)
Called facility and they said to send PT via [facility]

## 2013-12-25 NOTE — Discharge Instructions (Signed)
You're tests are normal. I believe you most likely had a seizure and were post-ictal when you were standing in your door way. You dilantin level is low, please have your doctor adjust your dilantin level to get it therapeutic.  Recheck as needed.

## 2013-12-25 NOTE — ED Notes (Signed)
Dr.Knapp at bedside  

## 2013-12-25 NOTE — ED Notes (Signed)
Pt undressed, in gown, on monitor, continuous pulse oximetry and blood pressure cuff 

## 2013-12-25 NOTE — ED Notes (Addendum)
Saa with phlebotomy at bedside. This RN was unable to collect at blood work during IV start

## 2013-12-25 NOTE — ED Notes (Signed)
Changed pt's diaper; placed clean dry diaper on pt with green chuk underneath pt; pt resting at this time

## 2014-03-04 ENCOUNTER — Ambulatory Visit (INDEPENDENT_AMBULATORY_CARE_PROVIDER_SITE_OTHER): Payer: Medicare Other | Admitting: Neurology

## 2014-03-04 ENCOUNTER — Encounter: Payer: Self-pay | Admitting: Neurology

## 2014-03-04 VITALS — BP 103/62 | HR 66 | Ht 69.0 in | Wt 161.0 lb

## 2014-03-04 DIAGNOSIS — I6330 Cerebral infarction due to thrombosis of unspecified cerebral artery: Secondary | ICD-10-CM

## 2014-03-04 DIAGNOSIS — G40209 Localization-related (focal) (partial) symptomatic epilepsy and epileptic syndromes with complex partial seizures, not intractable, without status epilepticus: Secondary | ICD-10-CM

## 2014-03-04 DIAGNOSIS — Z79899 Other long term (current) drug therapy: Secondary | ICD-10-CM

## 2014-03-04 MED ORDER — PHENYTOIN 50 MG PO CHEW: 50.0000 mg | CHEWABLE_TABLET | Freq: Every day | ORAL | Status: DC

## 2014-03-04 MED ORDER — LEVETIRACETAM 750 MG PO TABS: 750.0000 mg | ORAL_TABLET | Freq: Two times a day (BID) | ORAL | Status: DC

## 2014-03-04 NOTE — Patient Instructions (Addendum)
Seizure, Adult A seizure means there is unusual activity in the brain. A seizure can cause changes in attention or behavior. Seizures often cause shaking (convulsions). Seizures often last from 30 seconds to 2 minutes. HOME CARE   If you are given medicines, take them exactly as told by your doctor.  Keep all doctor visits as told.  Do not swim or drive until your doctor says it is okay.  Teach others what to do if you have a seizure. They should:  Lay you on the ground.  Put a cushion under your head.  Loosen any tight clothing around your neck.  Turn you on your side.  Stay with you until you get better. GET HELP RIGHT AWAY IF:   The seizure lasts longer than 2 to 5 minutes.  The seizure is very bad.  The person does not wake up after the seizure.  The person's attention or behavior changes. Drive the person to the emergency room or call your local emergency services (911 in [place] MAKE SURE YOU:   Understand these instructions.  Will watch your condition.  Will get help right away if you are not doing well or get worse. Document Released: 03/26/2008 Document Revised: 12/31/2011 Document Reviewed: 09/26/2011 [employer] Patient Information 2014 [employer].  

## 2014-03-04 NOTE — Progress Notes (Signed)
PATIENT: [name] DOB: 1951-09-15  REASON FOR VISIT: follow up HISTORY FROM: patient  HISTORY OF PRESENT ILLNESS: [name] is a 63 year old left-handed black male with a history of seizures and cerebrovascular disease.  He returns for follow-up. He currently takes Dilantin and Keppra. He reports that his last seizure was in March. The patient was at the nursing home due to a broken hip when the seizure happened and he was then taken to the hospital. At the hospital his Dilantin level was 8.7. He has been back at home for the last two months. His sister states that since he started [address] he does not sleep well at night. She states that he takes his pill around 7 or 8 at night. The patient states that he sometimes sleeps well at night and other times he doesn't. No new medical issues have come up since the last visit.   REVIEW OF SYSTEMS: Full 14 system review of systems performed and notable only for:  Constitutional: N/A  Eyes: N/A Ear/Nose/Throat: N/A  Skin: N/A  Cardiovascular: N/A  Respiratory: N/A  Gastrointestinal: N/A  Genitourinary: N/A Hematology/Lymphatic: N/A  Endocrine: N/A Musculoskeletal:N/A  Allergy/Immunology: N/A  Neurological: N/A Psychiatric: N/A Sleep: N/A   ALLERGIES: No Known Allergies  HOME MEDICATIONS: Outpatient Prescriptions Prior to Visit  Medication Sig Dispense Refill  . albuterol (PROVENTIL) (2.5 MG/3ML) 0.083% nebulizer solution Take 2.5 mg by nebulization every 4 (four) hours as needed for wheezing or shortness of breath.      Marland Kitchen aspirin EC 325 MG EC tablet Take 1 tablet (325 mg total) by mouth 2 (two) times daily after a meal.  30 tablet  0  . Cholecalciferol (VITAMIN D-3) 1000 UNITS CAPS Take 1,000 Units by mouth daily.       Marland Kitchen HYDROcodone-acetaminophen (NORCO/VICODIN) 5-325 MG per tablet Take 1-2 tablets by mouth every 6 (six) hours as needed for moderate pain.      . Melatonin 5 MG CAPS Take 5 mg by mouth at bedtime as needed (sleep).       . metoprolol tartrate (LOPRESSOR) 25 MG tablet Take 25 mg by mouth daily.      . pantoprazole (PROTONIX) 20 MG tablet Take 20 mg by mouth daily.      . phenytoin (DILANTIN) 100 MG ER capsule Take 100 mg by mouth 2 (two) times daily.      . simvastatin (ZOCOR) 20 MG tablet Take 20 mg by mouth every evening.      . levETIRAcetam (KEPPRA) 500 MG tablet Take 500 mg by mouth 2 (two) times daily.       No facility-administered medications prior to visit.    PAST MEDICAL HISTORY: Past Medical History  Diagnosis Date  . History of alcoholism   . Hx of ischemic vertebrobasilar artery thalamic stroke     Right  . Gait disorder   . Dementia   . 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 Seizures   . Memory disorder 03/04/2013  . Gait disorder   . Stroke     Right frontal, right thalamic  . Ulnar neuropathy of left upper extremity   . Alcoholism     History of, not active    PAST SURGICAL HISTORY: Past Surgical History  Procedure Laterality Date  . Wrist surgery Left 95 & 96  . Hip arthroplasty Left 11/26/2013    Procedure: LEFT HIP HEMIARTHROPLASTY;  Surgeon: Mcarthur Rossetti, MD;  Location: [facility];  Service: Orthopedics;  Laterality: Left;    FAMILY HISTORY: Family History  Problem Relation Age of Onset  . Pulmonary embolism Mother   . Liver disease Father     SOCIAL HISTORY: History   Social History  . Marital Status: Divorced    Spouse Name: N/A    Number of Children: 0  . Years of Education: 10   Occupational History  . Not on file.   Social History Main Topics  . Smoking status: Former Smoker -- 3.00 packs/day    Quit date: 01/02/2014  . Smokeless tobacco: Never Used  . Alcohol Use: No     Comment: Former Alcoholic  . Drug Use: No  . Sexual Activity: Yes   Other Topics Concern  . Not on file   Social History Narrative   Patient is single and lives with a friend and her son.   Patient does not have any children.   Patient is disabled.    Patient has a 10 th grade education.   Patient is left-handed.   Patient drinks some soda but not everyday.      PHYSICAL EXAM  Filed Vitals:   03/04/14 1056  BP: 103/62  Pulse: 66  Height: 5\' 9"  (1.753 m)  Weight: 161 lb (73.029 kg)   Body mass index is 23.76 kg/(m^2). Generalized: Well developed, in no acute distress   Neurological examination  Mentation: Alert oriented to time, place, history taking. Follows all commands speech and language fluent Cranial nerve II-XII:  Extraocular movements were full, visual field were full on confrontational test.  Motor: The motor testing reveals 5 over 5 strength in right upper extremity and the lower extremities. 4/5 in the left arm due to prior trauma. Good symmetric motor tone is noted throughout.  Sensory: Sensory testing is intact to soft touch on all 4 extremities. No evidence of extinction is noted.  Coordination: Cerebellar testing reveals good finger-nose-finger and heel-to-shin bilaterally.  Gait and station: Gait is unsteady. Uses a walker to ambulate. Left leg a little stiff due to prior stroke.Tandem gait not attempted. Romberg is negative. No drift is seen.  Reflexes: Deep tendon reflexes are symmetric and normal bilaterally.     DIAGNOSTIC DATA (LABS, IMAGING, TESTING) - I reviewed patient records, labs, notes, testing and imaging myself where available.  Lab Results  Component Value Date   WBC 4.9 12/25/2013   HGB 12.8* 12/25/2013   HCT 36.5* 12/25/2013   MCV 93.1 12/25/2013   PLT 181 12/25/2013      Component Value Date/Time   NA 137 12/25/2013 0800   NA 140 03/04/2013 1305   K 4.5 12/25/2013 0800   CL 99 12/25/2013 0800   CO2 28 12/25/2013 0800   GLUCOSE 106* 12/25/2013 0800   GLUCOSE 68 03/04/2013 1305   BUN 12 12/25/2013 0800   BUN 16 03/04/2013 1305   CREATININE 0.82 12/25/2013 0800   CALCIUM 9.7 12/25/2013 0800   PROT 7.7 12/25/2013 0800   PROT 7.4 03/04/2013 1305   ALBUMIN 3.5 12/25/2013 0800   AST 13 12/25/2013 0800   ALT 9  12/25/2013 0800   ALKPHOS 190* 12/25/2013 0800   BILITOT 0.3 12/25/2013 0800   GFRNONAA >90 12/25/2013 0800   GFRAA >90 12/25/2013 0800       ASSESSMENT AND PLAN 63 y.o. year old male  has a past medical history of History of alcoholism; ischemic vertebrobasilar artery thalamic stroke; Gait disorder; Dementia; Gingival hypertrophy; Seizures; Memory disorder (03/04/2013); Gait disorder; Stroke; Ulnar neuropathy of left upper extremity;  and Alcoholism. here with:   1. Localization-related (focal) (partial) epilepsy and epileptic syndromes with complex partial seizures, without mention of intractable epilepsy 2. Encounter for long-term (current) use of other medications  The patient had a seizure back in March 2015, at that time the dilantin level was sub therapeutic. In September 2014 the patient was admitted to the hospital for seizures and they increased his Keppra from 500-750 mg. He states that today he is only taking the 500 mg twice daily. This may explain why he had another seizure in March 2015. The patient lives at home with his sister. She helps him with his medications.  Will increase Keppra to 750 mg BID.  Will add Dilantin 50 mg to bedtime dose. Continue taking Dilantin 100 mg BID.  Will check dilantin level in three weeks.  Follow-up in 6 months or sooner if needed.    Ward Givens, MSN, NP-C 03/04/2014, 7:49 PM [facility] [address] [place], [place] [address] [phone]  Note: This document was prepared with digital dictation and possible smart phrase technology. Any transcriptional errors that result from this process are unintentional. Kathrynn Ducking

## 2014-03-24 ENCOUNTER — Telehealth: Payer: Self-pay | Admitting: Adult Health

## 2014-03-24 DIAGNOSIS — Z5181 Encounter for therapeutic drug level monitoring: Secondary | ICD-10-CM

## 2014-03-24 NOTE — Telephone Encounter (Signed)
Left a message for the patient to come get Dilantin level rechecked in the next week. Order has been placed.

## 2014-04-02 ENCOUNTER — Ambulatory Visit: Payer: Medicare Other | Attending: Adult Health | Admitting: Physical Therapy

## 2014-04-02 DIAGNOSIS — IMO0001 Reserved for inherently not codable concepts without codable children: Secondary | ICD-10-CM | POA: Diagnosis not present

## 2014-04-02 DIAGNOSIS — M6281 Muscle weakness (generalized): Secondary | ICD-10-CM | POA: Diagnosis not present

## 2014-04-02 DIAGNOSIS — R269 Unspecified abnormalities of gait and mobility: Secondary | ICD-10-CM | POA: Diagnosis not present

## 2014-04-07 ENCOUNTER — Ambulatory Visit: Payer: Medicare Other | Admitting: Physical Therapy

## 2014-04-07 DIAGNOSIS — IMO0001 Reserved for inherently not codable concepts without codable children: Secondary | ICD-10-CM | POA: Diagnosis not present

## 2014-04-13 ENCOUNTER — Ambulatory Visit: Payer: Medicare Other | Admitting: Rehabilitative and Restorative Service Providers"

## 2014-04-13 DIAGNOSIS — IMO0001 Reserved for inherently not codable concepts without codable children: Secondary | ICD-10-CM | POA: Diagnosis not present

## 2014-04-14 ENCOUNTER — Ambulatory Visit: Payer: Medicare Other | Admitting: Physical Therapy

## 2014-04-14 DIAGNOSIS — IMO0001 Reserved for inherently not codable concepts without codable children: Secondary | ICD-10-CM | POA: Diagnosis not present

## 2014-04-21 ENCOUNTER — Ambulatory Visit: Payer: Medicare Other | Attending: Adult Health | Admitting: Physical Therapy

## 2014-04-21 DIAGNOSIS — IMO0001 Reserved for inherently not codable concepts without codable children: Secondary | ICD-10-CM | POA: Diagnosis not present

## 2014-04-21 DIAGNOSIS — R269 Unspecified abnormalities of gait and mobility: Secondary | ICD-10-CM | POA: Diagnosis not present

## 2014-04-21 DIAGNOSIS — M6281 Muscle weakness (generalized): Secondary | ICD-10-CM | POA: Insufficient documentation

## 2014-04-22 ENCOUNTER — Ambulatory Visit: Payer: Medicare Other | Admitting: Physical Therapy

## 2014-04-22 DIAGNOSIS — IMO0001 Reserved for inherently not codable concepts without codable children: Secondary | ICD-10-CM | POA: Diagnosis not present

## 2014-04-27 ENCOUNTER — Ambulatory Visit: Payer: Medicare Other | Admitting: Physical Therapy

## 2014-04-29 ENCOUNTER — Ambulatory Visit: Payer: Medicare Other | Admitting: Physical Therapy

## 2014-04-29 DIAGNOSIS — IMO0001 Reserved for inherently not codable concepts without codable children: Secondary | ICD-10-CM | POA: Diagnosis not present

## 2014-05-04 ENCOUNTER — Ambulatory Visit: Payer: Medicare Other | Admitting: Physical Therapy

## 2014-05-04 DIAGNOSIS — IMO0001 Reserved for inherently not codable concepts without codable children: Secondary | ICD-10-CM | POA: Diagnosis not present

## 2014-05-06 ENCOUNTER — Ambulatory Visit: Payer: Medicare Other | Admitting: Physical Therapy

## 2014-05-06 DIAGNOSIS — IMO0001 Reserved for inherently not codable concepts without codable children: Secondary | ICD-10-CM | POA: Diagnosis not present

## 2014-07-11 ENCOUNTER — Other Ambulatory Visit: Payer: Self-pay | Admitting: Adult Health

## 2014-07-26 ENCOUNTER — Other Ambulatory Visit: Payer: Self-pay | Admitting: Nurse Practitioner

## 2014-07-28 ENCOUNTER — Encounter (HOSPITAL_COMMUNITY): Payer: Self-pay | Admitting: Emergency Medicine

## 2014-07-28 ENCOUNTER — Emergency Department (HOSPITAL_COMMUNITY)
Admission: EM | Admit: 2014-07-28 | Discharge: 2014-07-29 | Disposition: A | Discharge status: Home / Self Care | Payer: Medicare Other | Attending: Emergency Medicine | Admitting: Emergency Medicine

## 2014-07-28 ENCOUNTER — Emergency Department (HOSPITAL_COMMUNITY): Payer: Medicare Other

## 2014-07-28 DIAGNOSIS — S023XXA Fracture of orbital floor, initial encounter for closed fracture: Secondary | ICD-10-CM | POA: Insufficient documentation

## 2014-07-28 DIAGNOSIS — Z79899 Other long term (current) drug therapy: Secondary | ICD-10-CM | POA: Insufficient documentation

## 2014-07-28 DIAGNOSIS — Z8673 Personal history of transient ischemic attack (TIA), and cerebral infarction without residual deficits: Secondary | ICD-10-CM | POA: Insufficient documentation

## 2014-07-28 DIAGNOSIS — S0292XA Unspecified fracture of facial bones, initial encounter for closed fracture: Secondary | ICD-10-CM

## 2014-07-28 DIAGNOSIS — Z87891 Personal history of nicotine dependence: Secondary | ICD-10-CM | POA: Insufficient documentation

## 2014-07-28 DIAGNOSIS — R569 Unspecified convulsions: Secondary | ICD-10-CM

## 2014-07-28 DIAGNOSIS — W1839XA Other fall on same level, initial encounter: Secondary | ICD-10-CM | POA: Diagnosis not present

## 2014-07-28 DIAGNOSIS — F0390 Unspecified dementia without behavioral disturbance: Secondary | ICD-10-CM | POA: Insufficient documentation

## 2014-07-28 DIAGNOSIS — S02402A Zygomatic fracture, unspecified, initial encounter for closed fracture: Secondary | ICD-10-CM | POA: Diagnosis not present

## 2014-07-28 DIAGNOSIS — Y9289 Other specified places as the place of occurrence of the external cause: Secondary | ICD-10-CM | POA: Insufficient documentation

## 2014-07-28 DIAGNOSIS — Z7982 Long term (current) use of aspirin: Secondary | ICD-10-CM | POA: Insufficient documentation

## 2014-07-28 DIAGNOSIS — Z8719 Personal history of other diseases of the digestive system: Secondary | ICD-10-CM | POA: Diagnosis not present

## 2014-07-28 DIAGNOSIS — Y9389 Activity, other specified: Secondary | ICD-10-CM | POA: Insufficient documentation

## 2014-07-28 DIAGNOSIS — W19XXXA Unspecified fall, initial encounter: Secondary | ICD-10-CM

## 2014-07-28 DIAGNOSIS — G40209 Localization-related (focal) (partial) symptomatic epilepsy and epileptic syndromes with complex partial seizures, not intractable, without status epilepticus: Secondary | ICD-10-CM | POA: Diagnosis not present

## 2014-07-28 DIAGNOSIS — Z8669 Personal history of other diseases of the nervous system and sense organs: Secondary | ICD-10-CM | POA: Insufficient documentation

## 2014-07-28 DIAGNOSIS — S0993XA Unspecified injury of face, initial encounter: Secondary | ICD-10-CM | POA: Diagnosis present

## 2014-07-28 DIAGNOSIS — S02401A Maxillary fracture, unspecified, initial encounter for closed fracture: Secondary | ICD-10-CM | POA: Insufficient documentation

## 2014-07-28 LAB — CBC WITH DIFFERENTIAL/PLATELET
BASOS ABS: 0 10*3/uL (ref 0.0–0.1)
BASOS PCT: 0 % (ref 0–1)
EOS PCT: 0 % (ref 0–5)
Eosinophils Absolute: 0 10*3/uL (ref 0.0–0.7)
HCT: 39.7 % (ref 39.0–52.0)
Hemoglobin: 14.1 g/dL (ref 13.0–17.0)
Lymphocytes Relative: 16 % (ref 12–46)
Lymphs Abs: 1.5 10*3/uL (ref 0.7–4.0)
MCH: 32.4 pg (ref 26.0–34.0)
MCHC: 35.5 g/dL (ref 30.0–36.0)
MCV: 91.3 fL (ref 78.0–100.0)
MONO ABS: 0.8 10*3/uL (ref 0.1–1.0)
Monocytes Relative: 9 % (ref 3–12)
NEUTROS ABS: 6.5 10*3/uL (ref 1.7–7.7)
Neutrophils Relative %: 75 % (ref 43–77)
PLATELETS: 166 10*3/uL (ref 150–400)
RBC: 4.35 MIL/uL (ref 4.22–5.81)
RDW: 12.3 % (ref 11.5–15.5)
WBC: 8.8 10*3/uL (ref 4.0–10.5)

## 2014-07-28 LAB — CK: CK TOTAL: 229 U/L (ref 7–232)

## 2014-07-28 LAB — URINALYSIS, ROUTINE W REFLEX MICROSCOPIC
BILIRUBIN URINE: NEGATIVE
GLUCOSE, UA: NEGATIVE mg/dL
Hgb urine dipstick: NEGATIVE
KETONES UR: NEGATIVE mg/dL
Leukocytes, UA: NEGATIVE
Nitrite: NEGATIVE
PH: 6 (ref 5.0–8.0)
Protein, ur: NEGATIVE mg/dL
Specific Gravity, Urine: 1.026 (ref 1.005–1.030)
Urobilinogen, UA: 1 mg/dL (ref 0.0–1.0)

## 2014-07-28 LAB — COMPREHENSIVE METABOLIC PANEL
ALBUMIN: 4 g/dL (ref 3.5–5.2)
ALT: 38 U/L (ref 0–53)
ANION GAP: 13 (ref 5–15)
AST: 35 U/L (ref 0–37)
Alkaline Phosphatase: 165 U/L — ABNORMAL HIGH (ref 39–117)
BUN: 8 mg/dL (ref 6–23)
CALCIUM: 9.4 mg/dL (ref 8.4–10.5)
CO2: 25 mEq/L (ref 19–32)
CREATININE: 0.76 mg/dL (ref 0.50–1.35)
Chloride: 97 mEq/L (ref 96–112)
GFR calc non Af Amer: >90 mL/min (ref >90)
GLUCOSE: 98 mg/dL (ref 70–99)
Potassium: 3.8 mEq/L (ref 3.7–5.3)
Sodium: 135 mEq/L — ABNORMAL LOW (ref 137–147)
TOTAL PROTEIN: 7.7 g/dL (ref 6.0–8.3)
Total Bilirubin: 0.5 mg/dL (ref 0.3–1.2)

## 2014-07-28 MED ORDER — LEVETIRACETAM IN NACL 1000 MG/100ML IV SOLN
1000.0000 mg | Freq: Once | INTRAVENOUS | Status: AC
  Administered 2014-07-28: 1000 mg via INTRAVENOUS
  Filled 2014-07-28: qty 100

## 2014-07-28 NOTE — ED Notes (Signed)
Pt having shaking, rigid episodes where he is still able to focus eyes and speak somewhat. PA [facility] notified.

## 2014-07-28 NOTE — ED Notes (Addendum)
Pt BIB sister.  Sister states "He's either having a ministroke or a seizure. Sister states that since "this morning", around 11 am, pt started having twitching to left arm.  No twitching noted now.  States that he fell yesterday.  Pt has hx of seizures and stroke.  Pt has deficits from prior stroke.  No facial droop, no slurred speech ( being that pt has no teeth), NAD.  Weak left grip strength but sister states this is normal deficit.

## 2014-07-28 NOTE — ED Provider Notes (Signed)
CSN: [id number]     Arrival date & time 07/28/14  1950 History   First MD Initiated Contact with Patient 07/28/14 1921     Chief Complaint  Patient presents with  . Fall  . Shaking     (Consider location/radiation/quality/duration/timing/severity/associated sxs/prior Treatment) HPI Comments: Patient is a 63 year old male with history of alcoholism, ischemic vertebrobasilar artery thalamic stroke, dementia, seizures, stroke who presents to the emergency department after a fall. Patient's sister gives most of the history. She reports that he has been having episodes of left sided weakness for approximately 3-4 seconds at a time. This has happened twice today. This happened one time yesterday when the patient fell. She reports that he did not lose consciousness. After these episodes of stiffness he is not confused. It is unclear if the patient can remember these 3-4 seconds. History is limited because both the patient and the sister are very poor historians. The patient's sister reports this looks like prior mini strokes.  Patient is a 63 y.o. male presenting with fall. The history is provided by the patient and a relative. No language interpreter was used.  Fall Pertinent negatives include no chest pain, chills or fever.    Past Medical History  Diagnosis Date  . History of alcoholism   . Hx of ischemic vertebrobasilar artery thalamic stroke     Right  . Gait disorder   . Dementia   . 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 Seizures   . Memory disorder 03/04/2013  . Gait disorder   . Stroke     Right frontal, right thalamic  . Ulnar neuropathy of left upper extremity   . Alcoholism     History of, not active   Past Surgical History  Procedure Laterality Date  . Wrist surgery Left 95 & 96  . Hip arthroplasty Left 11/26/2013    Procedure: LEFT HIP HEMIARTHROPLASTY;  Surgeon: Mcarthur Rossetti, MD;  Location: [facility];  Service: Orthopedics;  Laterality: Left;   Family  History  Problem Relation Age of Onset  . Pulmonary embolism Mother   . Liver disease Father    History  Substance Use Topics  . Smoking status: Former Smoker -- 3.00 packs/day    Quit date: 01/02/2014  . Smokeless tobacco: Never Used  . Alcohol Use: No     Comment: Former Alcoholic    Review of Systems  Constitutional: Negative for fever and chills.  Respiratory: Negative for shortness of breath.   Cardiovascular: Negative for chest pain.  Neurological:       Extremity stiffness  All other systems reviewed and are negative.     Allergies  Review of patient's allergies indicates no known allergies.  Home Medications   Prior to Admission medications   Medication Sig Start Date End Date Taking? Authorizing Provider  albuterol (PROVENTIL) (2.5 MG/3ML) 0.083% nebulizer solution Take 2.5 mg by nebulization every 4 (four) hours as needed for wheezing or shortness of breath. 12/01/13   Kinnie Feil, MD  aspirin EC 325 MG EC tablet Take 1 tablet (325 mg total) by mouth 2 (two) times daily after a meal. 11/28/13   Mcarthur Rossetti, MD  Cholecalciferol (VITAMIN D-3) 1000 UNITS CAPS Take 1,000 Units by mouth daily.     Historical Provider, MD  HYDROcodone-acetaminophen (NORCO/VICODIN) 5-325 MG per tablet Take 1-2 tablets by mouth every 6 (six) hours as needed for moderate pain. 11/28/13   Mcarthur Rossetti, MD  levETIRAcetam (KEPPRA) 750 MG  tablet TAKE 1 TABLET BY MOUTH TWICE DAILY 07/13/14   Ward Givens, NP  Melatonin 5 MG CAPS Take 5 mg by mouth at bedtime as needed (sleep).    Historical Provider, MD  metoprolol tartrate (LOPRESSOR) 25 MG tablet Take 25 mg by mouth daily.    Historical Provider, MD  pantoprazole (PROTONIX) 20 MG tablet Take 20 mg by mouth daily. 12/01/13   Kinnie Feil, MD  phenytoin (DILANTIN) 100 MG ER capsule Take 100 mg by mouth 2 (two) times daily. 08/11/13   Philmore Pali, NP  phenytoin (DILANTIN) 100 MG ER capsule TAKE ONE CAPSULE BY MOUTH TWICE  DAILY 07/26/14   Kathrynn Ducking, MD  phenytoin (DILANTIN) 50 MG tablet Chew 1 tablet (50 mg total) by mouth at bedtime. 03/04/14   Ward Givens, NP  simvastatin (ZOCOR) 20 MG tablet Take 20 mg by mouth every evening.    Historical Provider, MD   BP 140/83  Pulse 74  Temp(Src) 99 F (37.2 C) (Oral)  Resp 20  SpO2 96% Physical Exam  Nursing note and vitals reviewed. Constitutional: He appears well-developed and well-nourished. No distress.  HENT:  Head: Normocephalic.    Right Ear: External ear normal.  Left Ear: External ear normal.  Nose: Nose normal.  Eyes: Conjunctivae and EOM are normal. Pupils are equal, round, and reactive to light.  No entrapment   Neck: Trachea normal, normal range of motion and phonation normal. No spinous process tenderness and no muscular tenderness present. No tracheal deviation present.  Cardiovascular: Normal rate, regular rhythm, normal heart sounds, intact distal pulses and normal pulses.   Pulses:      Radial pulses are 2+ on the right side, and 2+ on the left side.       Posterior tibial pulses are 2+ on the right side, and 2+ on the left side.  Pulmonary/Chest: Effort normal and breath sounds normal. No stridor.  Abdominal: Soft. He exhibits no distension. There is no tenderness.  Musculoskeletal: Normal range of motion.  Moves all extremities without ataxia  Neurological: He is alert.  Residual weakness from prior stroke on left.  Oriented to person and place. Not to time. Family reports patient is at his baseline.   Skin: Skin is warm and dry. He is not diaphoretic.  Psychiatric: He has a normal mood and affect. His behavior is normal.    ED Course  Procedures (including critical care time) Labs Review Labs Reviewed  COMPREHENSIVE METABOLIC PANEL - Abnormal; Notable for the following:    Sodium 135 (*)    Alkaline Phosphatase 165 (*)    All other components within normal limits  URINALYSIS, ROUTINE W REFLEX MICROSCOPIC - Abnormal;  Notable for the following:    Color, Urine AMBER (*)    All other components within normal limits  PHENYTOIN LEVEL, TOTAL - Abnormal; Notable for the following:    Phenytoin Lvl <2.5 (*)    All other components within normal limits  URINE CULTURE  CBC WITH DIFFERENTIAL  CK    Imaging Review Ct Head Wo Contrast  07/28/2014   CLINICAL DATA:  Status post fall yesterday. Possible seizure this morning with left arm twitching identified. History of prior seizure and stroke. Decreased left grip strength.  EXAM: CT HEAD WITHOUT CONTRAST  CT MAXILLOFACIAL WITHOUT CONTRAST  TECHNIQUE: Multidetector CT imaging of the head and maxillofacial structures were performed using the standard protocol without intravenous contrast. Multiplanar CT image reconstructions of the maxillofacial structures were also generated.  COMPARISON:  Head CT scan 12/25/2013.  FINDINGS: CT HEAD FINDINGS  Atrophy and chronic microvascular ischemic change are identified. Remote right parietal infarct is also again seen. No evidence of acute infarction, hemorrhage, mass lesion, mass effect, midline shift or abnormal extra-axial fluid collection is identified. No pneumocephalus or hydrocephalus. The calvarium is intact.  CT MAXILLOFACIAL FINDINGS  The patient has mildly displaced fractures of the anterior and lateral walls of the left maxillary sinus with associated hemorrhage into the sinus. There is also a nondisplaced, segmental fracture of the left zygomatic arch. The patient also has a nondisplaced fracture of the inferior wall of the left orbit without evidence of extraocular muscle entrapment. No other facial bone fracture is identified. The mandibular condyles are located. The globes are intact and lenses are located. Orbital fat is clear. Visualized upper cervical spine demonstrates no acute abnormality.  IMPRESSION: Mildly displaced fractures of the anterior and lateral walls of the left maxillary sinus. Nondisplaced, segmental  fracture left zygomatic arch. Nondisplaced fracture inferior wall left orbit without extraocular muscle entrapment.  No acute intracranial abnormality with atrophy, chronic microvascular ischemic change and a remote right parietal lobe infarct are identified as on the comparison study.   Electronically Signed   By: Inge Rise M.D.   On: 07/28/2014 20:37   Ct Maxillofacial Wo Cm  07/28/2014   CLINICAL DATA:  Status post fall yesterday. Possible seizure this morning with left arm twitching identified. History of prior seizure and stroke. Decreased left grip strength.  EXAM: CT HEAD WITHOUT CONTRAST  CT MAXILLOFACIAL WITHOUT CONTRAST  TECHNIQUE: Multidetector CT imaging of the head and maxillofacial structures were performed using the standard protocol without intravenous contrast. Multiplanar CT image reconstructions of the maxillofacial structures were also generated.  COMPARISON:  Head CT scan 12/25/2013.  FINDINGS: CT HEAD FINDINGS  Atrophy and chronic microvascular ischemic change are identified. Remote right parietal infarct is also again seen. No evidence of acute infarction, hemorrhage, mass lesion, mass effect, midline shift or abnormal extra-axial fluid collection is identified. No pneumocephalus or hydrocephalus. The calvarium is intact.  CT MAXILLOFACIAL FINDINGS  The patient has mildly displaced fractures of the anterior and lateral walls of the left maxillary sinus with associated hemorrhage into the sinus. There is also a nondisplaced, segmental fracture of the left zygomatic arch. The patient also has a nondisplaced fracture of the inferior wall of the left orbit without evidence of extraocular muscle entrapment. No other facial bone fracture is identified. The mandibular condyles are located. The globes are intact and lenses are located. Orbital fat is clear. Visualized upper cervical spine demonstrates no acute abnormality.  IMPRESSION: Mildly displaced fractures of the anterior and lateral  walls of the left maxillary sinus. Nondisplaced, segmental fracture left zygomatic arch. Nondisplaced fracture inferior wall left orbit without extraocular muscle entrapment.  No acute intracranial abnormality with atrophy, chronic microvascular ischemic change and a remote right parietal lobe infarct are identified as on the comparison study.   Electronically Signed   By: Inge Rise M.D.   On: 07/28/2014 20:37     EKG Interpretation   Date/Time:  Wednesday July 28 2014 19:52:57 EDT Ventricular Rate:  78 PR Interval:  175 QRS Duration: 92 QT Interval:  347 QTC Calculation: 395 R Axis:   66 Text Interpretation:  Sinus rhythm Minimal ST elevation, anterior leads  Baseline wander in lead(s) V4 No significant change was found Confirmed by  CAMPOS  MD, KEVIN ([id number]) on 07/28/2014 9:35:20 PM      MDM  Final diagnoses:  Fall  Partial seizure  Facial fracture due to fall, closed, initial encounter   Patient presents to ED for "stiffness" in left extremities. Likely partial seizures. Patient fell yesterday due to partial seizure. Mildly displaced fractures of the anterior and lateral walls of the left maxillary wall. Nondisplaced, segmental fracture of left zygomatic arch. Nondisplaced fracture inferior wall left orbit without extraocular muscle entrapment. Discussed with patient and family importance of follow up with ENT. Patient with sub therapeutic dilantin level. Patient dosed with Ativan, Keppra and Dilantin in ED. Dilantin is still running. Patient signed out to [place], PA-C at change of shift. If patient does not have any additional seizure like activity he is safe to be discharged home. If patient continues to have seizure like activity despite Keppra and Dilantin will consult neurology. Vital signs stable. Plan discussed with family and patient who are agreeable with plan. Discussed case with Dr. Venora Maples who agrees with plan. Patient / Family / Caregiver informed of clinical course,  understand medical decision-making process, and agree with plan.    Elwyn Lade, PA-C 07/29/14 1701

## 2014-07-29 DIAGNOSIS — S02402A Zygomatic fracture, unspecified, initial encounter for closed fracture: Secondary | ICD-10-CM | POA: Diagnosis not present

## 2014-07-29 LAB — URINE CULTURE
COLONY COUNT: NO GROWTH
CULTURE: NO GROWTH

## 2014-07-29 LAB — PHENYTOIN LEVEL, TOTAL: Phenytoin Lvl: <2.5 ug/mL — ABNORMAL LOW (ref 10.0–20.0)

## 2014-07-29 MED ORDER — PHENYTOIN 50 MG PO CHEW: 50.0000 mg | CHEWABLE_TABLET | Freq: Every day | ORAL | Status: DC

## 2014-07-29 MED ORDER — PHENYTOIN SODIUM EXTENDED 100 MG PO CAPS: 100.0000 mg | ORAL_CAPSULE | Freq: Two times a day (BID) | ORAL | Status: DC

## 2014-07-29 MED ORDER — SODIUM CHLORIDE 0.9 % IV SOLN
1000.0000 mg | Freq: Once | INTRAVENOUS | Status: AC
  Administered 2014-07-29: 1000 mg via INTRAVENOUS
  Filled 2014-07-29: qty 20

## 2014-07-29 MED ORDER — LORAZEPAM 2 MG/ML IJ SOLN
1.0000 mg | Freq: Once | INTRAMUSCULAR | Status: AC
  Administered 2014-07-29: 1 mg via INTRAVENOUS
  Filled 2014-07-29: qty 1

## 2014-07-29 NOTE — ED Provider Notes (Signed)
0445 - Patient care assumed from [facility], PA-C at shift change. Patient presenting for seizure-like episodes. These were fairly well controlled in ED following administration of ativan. Patient found to have subtherapeutic Dilantin level. Plan discussed with Marily Memos, PA-C which included discharge if no further seizure-like activity once Dilantin loaded. If seizure-like activity persists despite IV Dilantin, plan included neurology consult to discuss management.  On my examination following completion of Dilantin load, patient has had no further seizure-like activity. Patient's sister at bedside states that patient has had no recurrence of symptoms. Patient is desiring discharge. Given plan and well-appearing nature of patient, believe he is stable for discharge at this time. Will prescribe Dilantin as subtherapeutic level is likely secondary to noncompliance. I also advised neurology followup. Return precautions discussed and provided. Patient and sister agreeable to plan with no unaddressed concerns. Patient discharged in good condition.   Filed Vitals:   07/28/14 2300 07/28/14 2330 07/29/14 0000 07/29/14 0030  BP: 152/80 145/83 168/95 143/80  Pulse: 71 64    Temp:      TempSrc:      Resp: 22 20 20 21   SpO2: 95% 97%       Antonietta Breach, PA-C 07/29/14 0501

## 2014-07-29 NOTE — ED Notes (Signed)
Lab called about delay on Dilantin level. States it was never picked up by courier. Will call courier again.

## 2014-07-29 NOTE — ED Provider Notes (Signed)
Medical screening examination/treatment/procedure(s) were performed by non-physician practitioner and as supervising physician I was immediately available for consultation/collaboration.   EKG Interpretation   Date/Time:  Wednesday July 28 2014 19:52:57 EDT Ventricular Rate:  78 PR Interval:  175 QRS Duration: 92 QT Interval:  347 QTC Calculation: 395 R Axis:   66 Text Interpretation:  Sinus rhythm Minimal ST elevation, anterior leads  Baseline wander in lead(s) V4 No significant change was found Confirmed by  CAMPOS  MD, KEVIN ([id number]) on 07/28/2014 9:35:20 PM        Everlene Balls, MD 07/29/14 [phone]

## 2014-07-29 NOTE — Discharge Instructions (Signed)
Facial Fracture A facial fracture is a break in one of the bones of your face. HOME CARE INSTRUCTIONS   Protect the injured part of your face until it is healed.  Do not participate in activities which give chance for re-injury until your doctor approves.  Gently wash and dry your face.  Wear head and facial protection while riding a bicycle, motorcycle, or snowmobile. SEEK MEDICAL CARE IF:   An oral temperature above 102 F (38.9 C) develops.  You have severe headaches or notice changes in your vision.  You have new numbness or tingling in your face.  You develop nausea (feeling sick to your stomach), vomiting or a stiff neck. SEEK IMMEDIATE MEDICAL CARE IF:   You develop difficulty seeing or experience double vision.  You become dizzy, lightheaded, or faint.  You develop trouble speaking, breathing, or swallowing.  You have a watery discharge from your nose or ear. MAKE SURE YOU:   Understand these instructions.  Will watch your condition.  Will get help right away if you are not doing well or get worse. Document Released: 10/08/2005 Document Revised: 12/31/2011 Document Reviewed: 05/27/2008 [facility] Patient Information 2015 [place], [place]. This information is not intended to replace advice given to you by your health care provider. Make sure you discuss any questions you have with your health care provider.

## 2014-07-29 NOTE — ED Notes (Signed)
Pt continues to have seizure-like activity. Jarrett Soho PA notified.

## 2014-07-31 NOTE — ED Provider Notes (Signed)
Medical screening examination/treatment/procedure(s) were performed by non-physician practitioner and as supervising physician I was immediately available for consultation/collaboration.   EKG Interpretation   Date/Time:  Wednesday July 28 2014 19:52:57 EDT Ventricular Rate:  78 PR Interval:  175 QRS Duration: 92 QT Interval:  347 QTC Calculation: 395 R Axis:   66 Text Interpretation:  Sinus rhythm Minimal ST elevation, anterior leads  Baseline wander in lead(s) V4 No significant change was found Confirmed by  Samuele Storey  MD, Cyleigh Massaro ([id number]) on 07/28/2014 9:35:20 PM        Hoy Morn, MD 07/31/14 [phone]

## 2014-08-03 ENCOUNTER — Encounter: Payer: Self-pay | Admitting: Neurology

## 2014-08-03 ENCOUNTER — Ambulatory Visit (INDEPENDENT_AMBULATORY_CARE_PROVIDER_SITE_OTHER): Payer: Medicare Other | Admitting: Neurology

## 2014-08-03 VITALS — BP 138/84 | HR 60 | Ht 69.0 in | Wt 168.0 lb

## 2014-08-03 DIAGNOSIS — R269 Unspecified abnormalities of gait and mobility: Secondary | ICD-10-CM

## 2014-08-03 DIAGNOSIS — R413 Other amnesia: Secondary | ICD-10-CM

## 2014-08-03 DIAGNOSIS — R569 Unspecified convulsions: Secondary | ICD-10-CM

## 2014-08-03 DIAGNOSIS — Z5181 Encounter for therapeutic drug level monitoring: Secondary | ICD-10-CM

## 2014-08-03 DIAGNOSIS — I6330 Cerebral infarction due to thrombosis of unspecified cerebral artery: Secondary | ICD-10-CM

## 2014-08-03 MED ORDER — LEVETIRACETAM 750 MG PO TABS: 750.0000 mg | ORAL_TABLET | Freq: Two times a day (BID) | ORAL | Status: DC

## 2014-08-03 MED ORDER — PHENYTOIN 50 MG PO CHEW: 50.0000 mg | CHEWABLE_TABLET | Freq: Every day | ORAL | Status: DC

## 2014-08-03 MED ORDER — PHENYTOIN SODIUM EXTENDED 100 MG PO CAPS: 100.0000 mg | ORAL_CAPSULE | Freq: Two times a day (BID) | ORAL | Status: DC

## 2014-08-03 NOTE — Patient Instructions (Signed)

## 2014-08-03 NOTE — Progress Notes (Signed)
Reason for visit: Seizures  [name] is an 63 y.o. male  History of present illness:  [name] is a 63 year old left-handed black male with a history of seizures. The patient has a history of cerebrovascular disease with left-sided deficit. The patient walks with a cane. The patient has been on Keppra and Dilantin. The caretaker indicates that the patient has been taking his medications, but he went to the emergency room on 07/28/2014 with a prolonged seizure. The patient was noted to have a Dilantin level that was essentially 0. The patient had been increased on his Dilantin approximately 6 months ago, and he was to come back in early June to recheck a level, but he never had the blood work done. The patient returns to this office for an evaluation. With the seizure, the patient fell and struck the left side of his head, sustaining a maxillary fracture.   Past Medical History  Diagnosis Date  . History of alcoholism   . Hx of ischemic vertebrobasilar artery thalamic stroke     Right  . Gait disorder   . Dementia   . 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 Seizures   . Memory disorder 03/04/2013  . Gait disorder   . Stroke     Right frontal, right thalamic  . Ulnar neuropathy of left upper extremity   . Alcoholism     History of, not active    Past Surgical History  Procedure Laterality Date  . Wrist surgery Left 95 & 96  . Hip arthroplasty Left 11/26/2013    Procedure: LEFT HIP HEMIARTHROPLASTY;  Surgeon: Mcarthur Rossetti, MD;  Location: [facility];  Service: Orthopedics;  Laterality: Left;    Family History  Problem Relation Age of Onset  . Pulmonary embolism Mother   . Liver disease Father   . Diabetes Sister   . Hypertension Sister   . Seizures Neg Hx   . Hypertension Sister   . Hypertension Sister     Social history:  reports that he quit smoking about 7 months ago. He has never used smokeless tobacco. He reports that he does not drink alcohol or  use illicit drugs.   No Known Allergies  Medications:  Current Outpatient Prescriptions on File Prior to Visit  Medication Sig Dispense Refill  . aspirin EC 325 MG EC tablet Take 1 tablet (325 mg total) by mouth 2 (two) times daily after a meal.  30 tablet  0  . Cholecalciferol (VITAMIN D-3) 1000 UNITS CAPS Take 1,000 Units by mouth daily.       Marland Kitchen ibuprofen (ADVIL,MOTRIN) 200 MG tablet Take 400 mg by mouth 2 (two) times daily as needed for moderate pain.      . metoprolol tartrate (LOPRESSOR) 25 MG tablet Take 25 mg by mouth daily.      . simvastatin (ZOCOR) 20 MG tablet Take 20 mg by mouth every evening.       No current facility-administered medications on file prior to visit.    ROS:  Out of a complete 14 system review of symptoms, the patient complains only of the following symptoms, and all other reviewed systems are negative.  Restless legs Walking difficulty Seizures  Blood pressure 138/84, pulse 60, height 5\' 9"  (1.753 m), weight 168 lb (76.204 kg).  Physical Exam  General: The patient is alert and cooperative at the time of the examination.  Skin: No significant peripheral edema is noted.   Neurologic Exam  Mental status: The patient is alert and cooperative.  Cranial nerves: Facial symmetry is present. Speech is normal, no aphasia or dysarthria is noted. Extraocular movements are full. Visual fields are full.  Motor: The patient has good strength in all 4 extremities, with the exception that there is decreased strength, 4/5, with the left upper extremity..  Sensory examination: Soft touch sensation is decreased on the left face, arm, and leg.  Coordination: The patient has good finger-nose-finger bilaterally. The patient has severe apraxia with the use of the lower extremities.  Gait and station: The patient has a wide-based, circumduction gait with the left leg. The patient uses a cane for ambulation. Tandem gait was not tested. Romberg is negative. No drift is  seen.  Reflexes: Deep tendon reflexes are symmetric.    CT head 07/28/14:  IMPRESSION:  Mildly displaced fractures of the anterior and lateral walls of the  left maxillary sinus. Nondisplaced, segmental fracture left  zygomatic arch. Nondisplaced fracture inferior wall left orbit  without extraocular muscle entrapment.  No acute intracranial abnormality with atrophy, chronic  microvascular ischemic change and a remote right parietal lobe  infarct are identified as on the comparison study.    Assessment/Plan:  One. Cerebrovascular disease, left hemiparesis  2. History of seizures with recent recurrence  The patient is on Dilantin and Keppra, but the Dilantin level was 0 on his last visit to the emergency room following a prolonged seizure on 07/28/2014. The caretaker believes that the patient has been taking his medications, but it is likely that he has not. The patient is to carefully take his medications over the next week, and he is to return for a Dilantin level in 7 days. Depending upon the results of this blood work, the medication dosing may be readjusted. The patient does not operate a motor vehicle.  Jill Alexanders MD 08/03/2014 8:11 PM  [facility] [address] [place], [place] [address]  Phone [phone] Fax [phone]

## 2014-08-12 ENCOUNTER — Other Ambulatory Visit (INDEPENDENT_AMBULATORY_CARE_PROVIDER_SITE_OTHER): Payer: Self-pay

## 2014-08-12 DIAGNOSIS — Z0289 Encounter for other administrative examinations: Secondary | ICD-10-CM

## 2014-08-12 DIAGNOSIS — Z5181 Encounter for therapeutic drug level monitoring: Secondary | ICD-10-CM

## 2014-08-13 ENCOUNTER — Telehealth: Payer: Self-pay | Admitting: Adult Health

## 2014-08-13 LAB — PHENYTOIN LEVEL, TOTAL: Phenytoin Lvl: 21.1 ug/mL (ref 10.0–20.0)

## 2014-08-13 NOTE — Telephone Encounter (Signed)
I called the patient and spoke to his nephew. His Dilantin level is slightly elevated. He will continue taking his Dilantin as prescribed. We will recheck his dilantin level in 2 weeks.

## 2014-08-17 NOTE — Telephone Encounter (Signed)
I called and spoke to [name]. I explained that the Dilantin level was slightly elevated. He is to continue taking Dilantin as prescribed. We will recheck a dilantin level in 2 week. She verbalized understanding.

## 2014-08-17 NOTE — Telephone Encounter (Signed)
Patient's sister Lindley Hiney calling to state that we need to call her with these results, please call her back at [phone].

## 2014-08-30 ENCOUNTER — Telehealth: Payer: Self-pay | Admitting: Adult Health

## 2014-08-30 DIAGNOSIS — Z5181 Encounter for therapeutic drug level monitoring: Secondary | ICD-10-CM

## 2014-08-30 NOTE — Telephone Encounter (Signed)
I called the patient. Tried to speak to [name] but she was not available. I told the nephew that [name] is due for lab work and could come to our office at his convenience to have this checked. Nephew verbalized understanding.

## 2014-09-08 ENCOUNTER — Encounter: Payer: Self-pay | Admitting: Neurology

## 2014-09-09 ENCOUNTER — Ambulatory Visit: Payer: Medicare Other | Admitting: Neurology

## 2014-09-11 ENCOUNTER — Other Ambulatory Visit: Payer: Self-pay | Admitting: Neurology

## 2014-09-13 ENCOUNTER — Telehealth: Payer: Self-pay | Admitting: Neurology

## 2014-09-14 ENCOUNTER — Encounter: Payer: Self-pay | Admitting: Neurology

## 2014-09-15 ENCOUNTER — Other Ambulatory Visit (INDEPENDENT_AMBULATORY_CARE_PROVIDER_SITE_OTHER): Payer: Self-pay

## 2014-09-15 DIAGNOSIS — Z0289 Encounter for other administrative examinations: Secondary | ICD-10-CM

## 2014-09-23 ENCOUNTER — Other Ambulatory Visit (INDEPENDENT_AMBULATORY_CARE_PROVIDER_SITE_OTHER): Payer: Medicare Other

## 2014-09-23 DIAGNOSIS — Z0289 Encounter for other administrative examinations: Secondary | ICD-10-CM

## 2014-09-23 NOTE — Addendum Note (Signed)
Addended by: Clarene Critchley on: 09/23/2014 09:45 AM   Modules accepted: Orders

## 2014-09-24 LAB — PHENYTOIN LEVEL, TOTAL: PHENYTOIN LVL: 20 ug/mL (ref 10.0–20.0)

## 2014-09-27 NOTE — Progress Notes (Signed)
Called, LM that his labs were normal.

## 2014-09-30 IMAGING — CR DG HIP W/ PELVIS BILAT
4 series · 4 of 4 positions shown · non-contrast
Comparison: None.

CLINICAL DATA: Fall.  Severe left hip pain.

EXAM:
BILATERAL HIP WITH PELVIS - 4+ VIEW

[t pelvis a.p.]
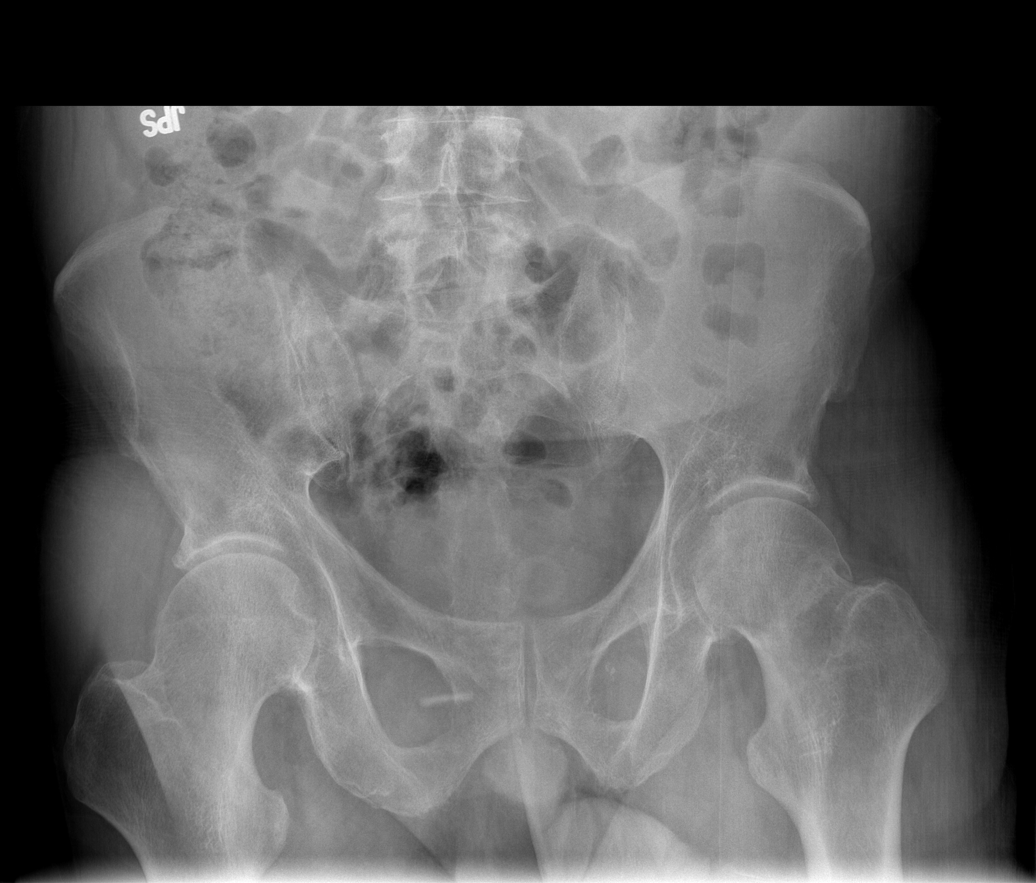

[t hip ap right]
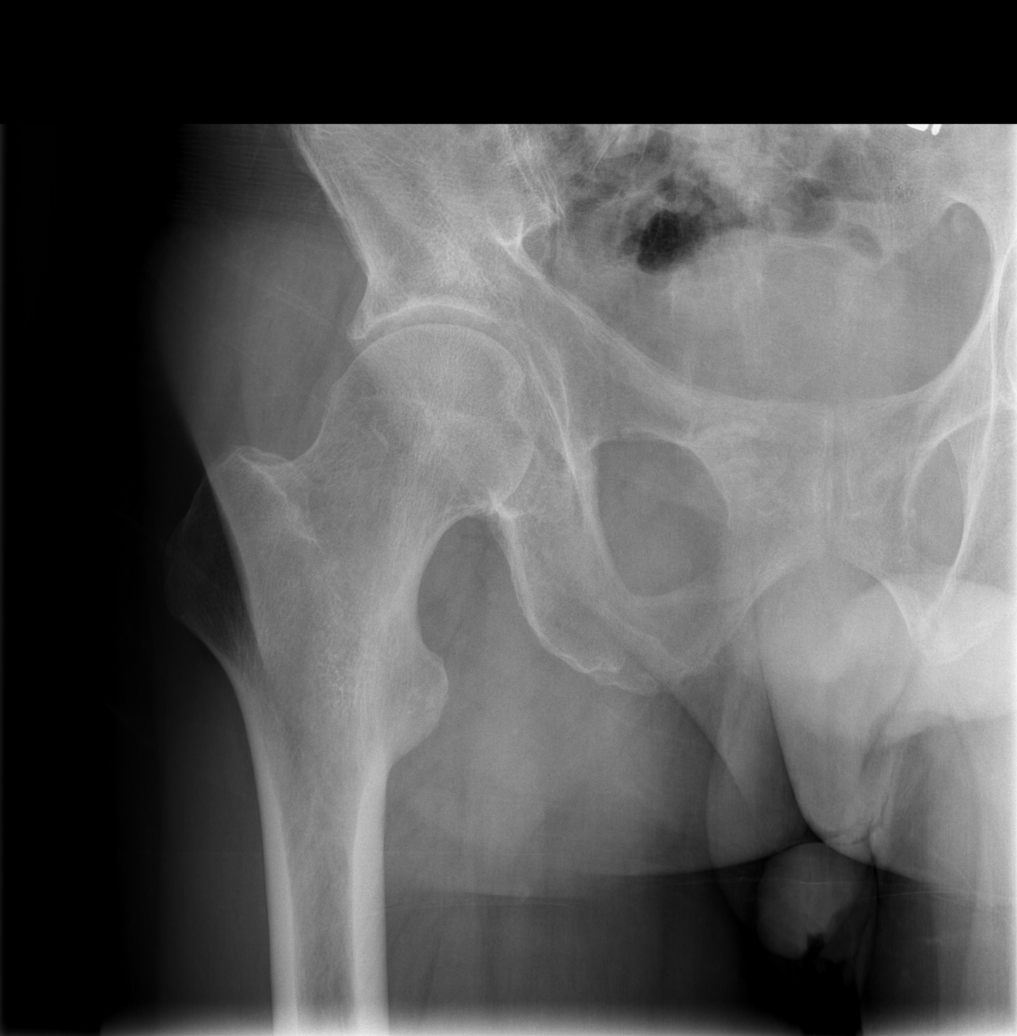

[t hip frog leg right]
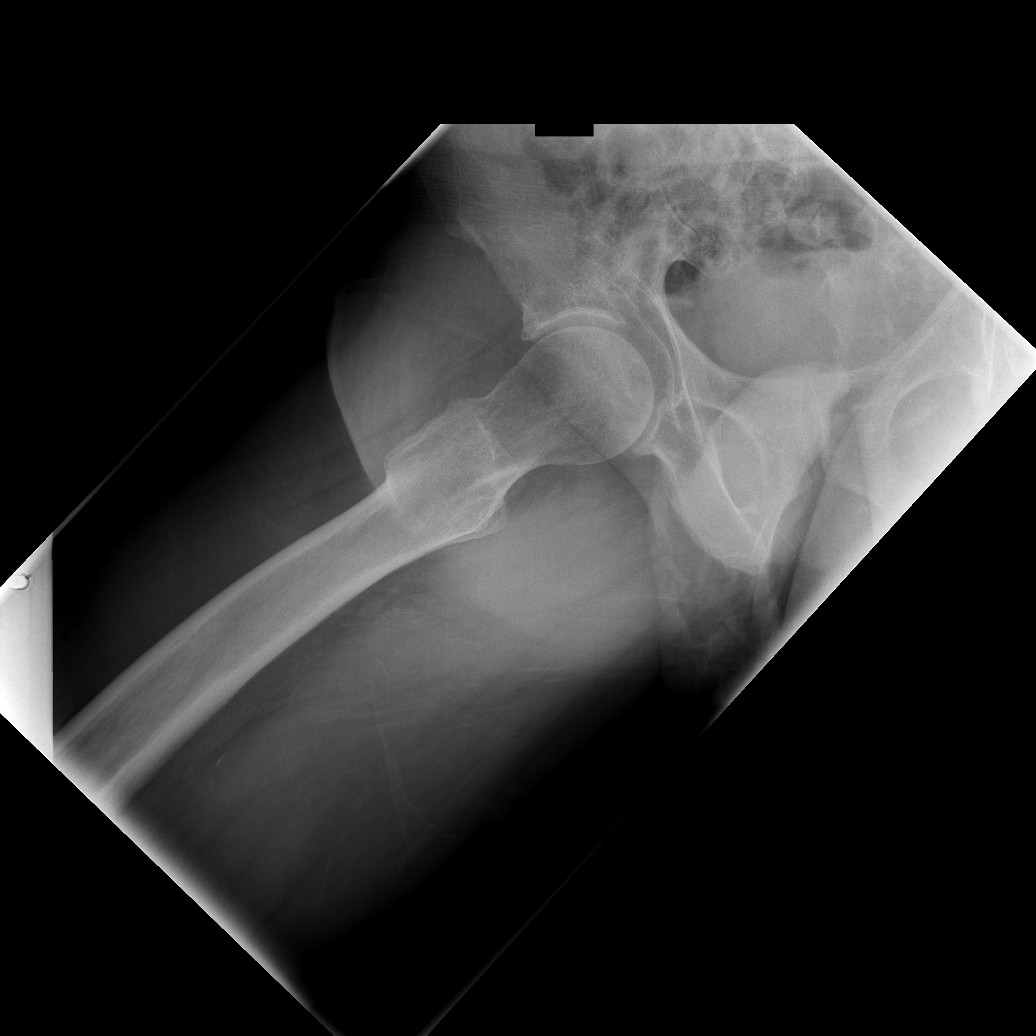

[t hip ap left]
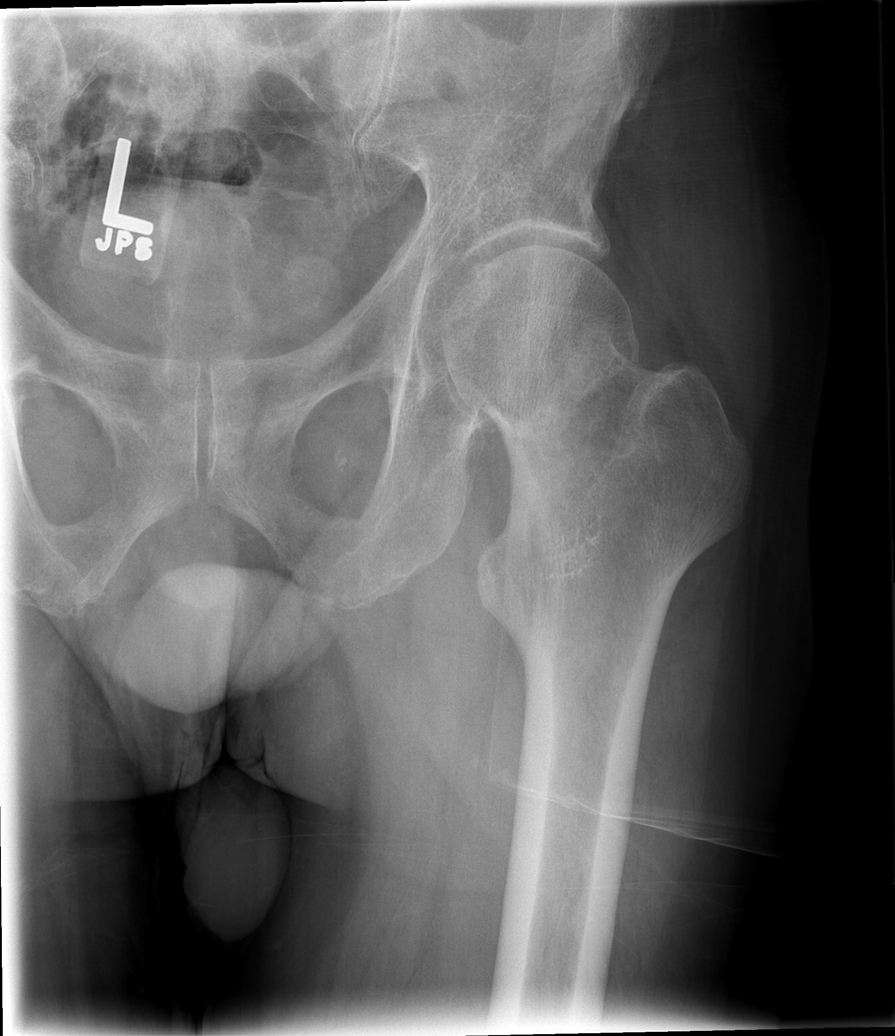

[4 of 4 positions shown; findings below may reference images not displayed]

FINDINGS: Impacted fracture involving the subcapital left femoral neck. No
fractures elsewhere involving the bony pelvis or the proximal right
femur.

Joint spaces well preserved in both hips. Bone mineral density
well-preserved. Sacroiliac joints intact with degenerative changes.
Symphysis pubis intact. Visualized lower lumbar spine intact.
IMPRESSION: Impacted fracture involving the subcapital left femoral neck.

## 2014-11-04 ENCOUNTER — Ambulatory Visit: Payer: Medicare Other | Admitting: Adult Health

## 2014-11-17 DIAGNOSIS — I10 Essential (primary) hypertension: Secondary | ICD-10-CM | POA: Diagnosis not present

## 2014-11-17 DIAGNOSIS — G40909 Epilepsy, unspecified, not intractable, without status epilepticus: Secondary | ICD-10-CM | POA: Diagnosis not present

## 2014-11-17 DIAGNOSIS — M6240 Contracture of muscle, unspecified site: Secondary | ICD-10-CM | POA: Diagnosis not present

## 2014-11-17 DIAGNOSIS — R2681 Unsteadiness on feet: Secondary | ICD-10-CM | POA: Diagnosis not present

## 2014-12-01 DIAGNOSIS — I10 Essential (primary) hypertension: Secondary | ICD-10-CM | POA: Diagnosis not present

## 2014-12-01 DIAGNOSIS — M6240 Contracture of muscle, unspecified site: Secondary | ICD-10-CM | POA: Diagnosis not present

## 2014-12-01 DIAGNOSIS — G40909 Epilepsy, unspecified, not intractable, without status epilepticus: Secondary | ICD-10-CM | POA: Diagnosis not present

## 2014-12-01 DIAGNOSIS — R2681 Unsteadiness on feet: Secondary | ICD-10-CM | POA: Diagnosis not present

## 2014-12-29 DIAGNOSIS — G40909 Epilepsy, unspecified, not intractable, without status epilepticus: Secondary | ICD-10-CM | POA: Diagnosis not present

## 2014-12-29 DIAGNOSIS — G4700 Insomnia, unspecified: Secondary | ICD-10-CM | POA: Diagnosis not present

## 2014-12-29 DIAGNOSIS — R2681 Unsteadiness on feet: Secondary | ICD-10-CM | POA: Diagnosis not present

## 2014-12-29 DIAGNOSIS — I10 Essential (primary) hypertension: Secondary | ICD-10-CM | POA: Diagnosis not present

## 2015-01-01 ENCOUNTER — Encounter (HOSPITAL_COMMUNITY): Payer: Self-pay | Admitting: Emergency Medicine

## 2015-01-01 ENCOUNTER — Emergency Department (HOSPITAL_COMMUNITY)
Admission: EM | Admit: 2015-01-01 | Discharge: 2015-01-01 | Disposition: A | Discharge status: Home / Self Care | Payer: Medicare Other | Attending: Emergency Medicine | Admitting: Emergency Medicine

## 2015-01-01 ENCOUNTER — Emergency Department (HOSPITAL_COMMUNITY): Payer: Medicare Other

## 2015-01-01 DIAGNOSIS — Z7982 Long term (current) use of aspirin: Secondary | ICD-10-CM | POA: Insufficient documentation

## 2015-01-01 DIAGNOSIS — Z87891 Personal history of nicotine dependence: Secondary | ICD-10-CM | POA: Diagnosis not present

## 2015-01-01 DIAGNOSIS — R40241 Glasgow coma scale score 13-15: Secondary | ICD-10-CM | POA: Diagnosis not present

## 2015-01-01 DIAGNOSIS — F0390 Unspecified dementia without behavioral disturbance: Secondary | ICD-10-CM | POA: Diagnosis not present

## 2015-01-01 DIAGNOSIS — Z79899 Other long term (current) drug therapy: Secondary | ICD-10-CM | POA: Insufficient documentation

## 2015-01-01 DIAGNOSIS — F0391 Unspecified dementia with behavioral disturbance: Secondary | ICD-10-CM | POA: Diagnosis not present

## 2015-01-01 DIAGNOSIS — R531 Weakness: Secondary | ICD-10-CM | POA: Diagnosis not present

## 2015-01-01 DIAGNOSIS — R4182 Altered mental status, unspecified: Secondary | ICD-10-CM | POA: Diagnosis not present

## 2015-01-01 DIAGNOSIS — Z9183 Wandering in diseases classified elsewhere: Secondary | ICD-10-CM | POA: Insufficient documentation

## 2015-01-01 DIAGNOSIS — G40909 Epilepsy, unspecified, not intractable, without status epilepticus: Secondary | ICD-10-CM | POA: Diagnosis not present

## 2015-01-01 DIAGNOSIS — Z8673 Personal history of transient ischemic attack (TIA), and cerebral infarction without residual deficits: Secondary | ICD-10-CM | POA: Insufficient documentation

## 2015-01-01 DIAGNOSIS — Z8719 Personal history of other diseases of the digestive system: Secondary | ICD-10-CM | POA: Diagnosis not present

## 2015-01-01 LAB — COMPREHENSIVE METABOLIC PANEL
ALBUMIN: 3.7 g/dL (ref 3.5–5.2)
ALT: 19 U/L (ref 0–53)
AST: 22 U/L (ref 0–37)
Alkaline Phosphatase: 138 U/L — ABNORMAL HIGH (ref 39–117)
Anion gap: 7 (ref 5–15)
BUN: 12 mg/dL (ref 6–23)
CO2: 28 mmol/L (ref 19–32)
CREATININE: 0.89 mg/dL (ref 0.50–1.35)
Calcium: 9.3 mg/dL (ref 8.4–10.5)
Chloride: 106 mmol/L (ref 96–112)
GFR calc non Af Amer: 90 mL/min — ABNORMAL LOW (ref >90)
Glucose, Bld: 105 mg/dL — ABNORMAL HIGH (ref 70–99)
Potassium: 3.9 mmol/L (ref 3.5–5.1)
Sodium: 141 mmol/L (ref 135–145)
Total Bilirubin: 0.8 mg/dL (ref 0.3–1.2)
Total Protein: 7.2 g/dL (ref 6.0–8.3)

## 2015-01-01 LAB — CBC WITH DIFFERENTIAL/PLATELET
Basophils Absolute: 0 10*3/uL (ref 0.0–0.1)
Basophils Relative: 0 % (ref 0–1)
Eosinophils Absolute: 0.2 10*3/uL (ref 0.0–0.7)
Eosinophils Relative: 3 % (ref 0–5)
HEMATOCRIT: 36.5 % — AB (ref 39.0–52.0)
Hemoglobin: 12.8 g/dL — ABNORMAL LOW (ref 13.0–17.0)
LYMPHS ABS: 1 10*3/uL (ref 0.7–4.0)
LYMPHS PCT: 23 % (ref 12–46)
MCH: 31.8 pg (ref 26.0–34.0)
MCHC: 35.1 g/dL (ref 30.0–36.0)
MCV: 90.8 fL (ref 78.0–100.0)
Monocytes Absolute: 0.5 10*3/uL (ref 0.1–1.0)
Monocytes Relative: 12 % (ref 3–12)
Neutro Abs: 2.8 10*3/uL (ref 1.7–7.7)
Neutrophils Relative %: 62 % (ref 43–77)
PLATELETS: 150 10*3/uL (ref 150–400)
RBC: 4.02 MIL/uL — ABNORMAL LOW (ref 4.22–5.81)
RDW: 13 % (ref 11.5–15.5)
WBC: 4.5 10*3/uL (ref 4.0–10.5)

## 2015-01-01 LAB — URINALYSIS, ROUTINE W REFLEX MICROSCOPIC
BILIRUBIN URINE: NEGATIVE
Glucose, UA: NEGATIVE mg/dL
HGB URINE DIPSTICK: NEGATIVE
KETONES UR: NEGATIVE mg/dL
LEUKOCYTES UA: NEGATIVE
NITRITE: NEGATIVE
Protein, ur: NEGATIVE mg/dL
Specific Gravity, Urine: 1.025 (ref 1.005–1.030)
Urobilinogen, UA: 1 mg/dL (ref 0.0–1.0)
pH: 7 (ref 5.0–8.0)

## 2015-01-01 LAB — RAPID URINE DRUG SCREEN, HOSP PERFORMED
Amphetamines: NOT DETECTED
BARBITURATES: NOT DETECTED
Benzodiazepines: NOT DETECTED
Cocaine: NOT DETECTED
Opiates: NOT DETECTED
TETRAHYDROCANNABINOL: NOT DETECTED

## 2015-01-01 LAB — ETHANOL

## 2015-01-01 NOTE — ED Notes (Signed)
The pt knows he is in this hospital.  He thinks its 1988  No month .  He does not  Know who the president is.  He cannot  Remember  His address.  He knows his name

## 2015-01-01 NOTE — ED Provider Notes (Signed)
CSN: [id number]     Arrival date & time 01/01/15  0459 History   First MD Initiated Contact with Patient 01/01/15 0535     Chief Complaint  Patient presents with  . Altered Mental Status     (Consider location/radiation/quality/duration/timing/severity/associated sxs/prior Treatment) HPI Patient with a history of stroke and left-sided weakness, memory disorder and dementia presents by EMS after being found wandering down the road. Patient does not know where he lives. He is oriented to self. Denied any recent falls. Denied head or neck pain. Has no chest pain or shortness of breath. Past Medical History  Diagnosis Date  . History of alcoholism   . Hx of ischemic vertebrobasilar artery thalamic stroke     Right  . Gait disorder   . Dementia   . 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 Seizures   . Memory disorder 03/04/2013  . Gait disorder   . Stroke     Right frontal, right thalamic  . Ulnar neuropathy of left upper extremity   . Alcoholism     History of, not active   Past Surgical History  Procedure Laterality Date  . Wrist surgery Left 95 & 96  . Hip arthroplasty Left 11/26/2013    Procedure: LEFT HIP HEMIARTHROPLASTY;  Surgeon: Mcarthur Rossetti, MD;  Location: [facility];  Service: Orthopedics;  Laterality: Left;   Family History  Problem Relation Age of Onset  . Pulmonary embolism Mother   . Liver disease Father   . Diabetes Sister   . Hypertension Sister   . Seizures Neg Hx   . Hypertension Sister   . Hypertension Sister    History  Substance Use Topics  . Smoking status: Former Smoker -- 3.00 packs/day    Quit date: 01/02/2014  . Smokeless tobacco: Former Systems developer  . Alcohol Use: No     Comment: Former Alcoholic    Review of Systems  Constitutional: Negative for fever and chills.  Respiratory: Negative for cough and shortness of breath.   Cardiovascular: Negative for chest pain.  Gastrointestinal: Negative for nausea, vomiting and abdominal pain.   Musculoskeletal: Negative for back pain, neck pain and neck stiffness.  Skin: Negative for rash and wound.  Neurological: Negative for dizziness, weakness and light-headedness.  Psychiatric/Behavioral: Positive for confusion.  All other systems reviewed and are negative.     Allergies  Review of patient's allergies indicates no known allergies.  Home Medications   Prior to Admission medications   Medication Sig Start Date End Date Taking? Authorizing Provider  aspirin EC 325 MG EC tablet Take 1 tablet (325 mg total) by mouth 2 (two) times daily after a meal. 11/28/13   Mcarthur Rossetti, MD  Cholecalciferol (VITAMIN D-3) 1000 UNITS CAPS Take 1,000 Units by mouth daily.     Historical Provider, MD  ibuprofen (ADVIL,MOTRIN) 200 MG tablet Take 400 mg by mouth 2 (two) times daily as needed for moderate pain.    Historical Provider, MD  levETIRAcetam (KEPPRA) 750 MG tablet Take 1 tablet (750 mg total) by mouth 2 (two) times daily. 08/03/14   Kathrynn Ducking, MD  metoprolol tartrate (LOPRESSOR) 25 MG tablet Take 25 mg by mouth daily.    Historical Provider, MD  phenytoin (DILANTIN) 100 MG ER capsule Take 1 capsule (100 mg total) by mouth 2 (two) times daily. 08/03/14   Kathrynn Ducking, MD  phenytoin (DILANTIN) 100 MG ER capsule TAKE ONE CAPSULE BY MOUTH TWICE DAILY 09/12/14   Juanda Crumble  Loreta Ave, MD  phenytoin (DILANTIN) 50 MG tablet Chew 1 tablet (50 mg total) by mouth at bedtime. 08/03/14   Kathrynn Ducking, MD  QUEtiapine (SEROQUEL) 25 MG tablet Take 25 mg by mouth daily. 08/02/14   Historical Provider, MD  simvastatin (ZOCOR) 20 MG tablet Take 20 mg by mouth every evening.    Historical Provider, MD   BP 103/85 mmHg  Pulse 69  Temp(Src) 98 F (36.7 C) (Oral)  Resp 16  SpO2 97% Physical Exam  Constitutional: He appears well-developed and well-nourished. No distress.  HENT:  Head: Normocephalic and atraumatic.  Mouth/Throat: Oropharynx is clear and moist. No oropharyngeal  exudate.  Eyes: EOM are normal. Pupils are equal, round, and reactive to light.  Neck: Normal range of motion. Neck supple.  No meningismus. No posterior midline cervical tenderness to palpation.  Cardiovascular: Normal rate and regular rhythm.  Exam reveals no gallop and no friction rub.   No murmur heard. Pulmonary/Chest: Effort normal and breath sounds normal. No respiratory distress. He has no wheezes. He has no rales.  Abdominal: Soft. Bowel sounds are normal. He exhibits no distension and no mass. There is no tenderness. There is no rebound and no guarding.  Musculoskeletal: Normal range of motion. He exhibits no edema or tenderness.  Full range of motion of all joints.  Neurological: He is alert.  Patient is oriented to person and place. Does not know year or month. Is unaware of why he is in the emergency department. Patient has 3/5 strength in left upper and lower extremities. 5/5 strength in right upper and right lower extremities. Sensation grossly intact.  Skin: Skin is warm and dry. No rash noted. No erythema.  Psychiatric: He has a normal mood and affect. His behavior is normal.  Nursing note and vitals reviewed.   ED Course  Procedures (including critical care time) Labs Review Labs Reviewed  CBC WITH DIFFERENTIAL/PLATELET - Abnormal; Notable for the following:    RBC 4.02 (*)    Hemoglobin 12.8 (*)    HCT 36.5 (*)    All other components within normal limits  COMPREHENSIVE METABOLIC PANEL - Abnormal; Notable for the following:    Glucose, Bld 105 (*)    Alkaline Phosphatase 138 (*)    GFR calc non Af Amer 90 (*)    All other components within normal limits  URINALYSIS, ROUTINE W REFLEX MICROSCOPIC  ETHANOL  URINE RAPID DRUG SCREEN (HOSP PERFORMED)    Imaging Review Ct Head Wo Contrast  01/01/2015   CLINICAL DATA:  Mental status change. Left-sided weakness and dementia  EXAM: CT HEAD WITHOUT CONTRAST  TECHNIQUE: Contiguous axial images were obtained from the base  of the skull through the vertex without intravenous contrast.  COMPARISON:  07/28/2014  FINDINGS: There is prominence of the sulci and ventricles consistent with brain atrophy. Along the cerebral convexities. There is a motion artifact which diminishes exam detail in this area. This affects images 35 through 30. Low attenuation within the subcortical and periventricular white matter is identified compatible with chronic microvascular disease. Sequelae of previous right MCA infarct is noted with volume loss from the right temporal lobe and right parietal lobe. There is associated ex vacuo dilatation of the right lateral ventricle. No evidence for acute intracranial hemorrhage, cortical infarct or mass lesion. The paranasal sinuses and mastoid air cells are clear. The calvarium appears intact.  IMPRESSION: 1. Exam detail is diminished along the cerebral convexities as above. Recommend repeat imaging as patient's clinical condition tolerates.  2. On the unaffected images there is no evidence for acute intracranial abnormality.   Electronically Signed   By: Kerby Moors M.D.   On: 01/01/2015 07:31     EKG Interpretation None      MDM   Final diagnoses:  Dementia, with behavioral disturbance   Patient with similar episodes per EMS in the past. At his baseline mental status per EMS. Will attempt to contact family. Anticipate discharge home with family.     Julianne Rice, MD 01/02/15 [phone]

## 2015-01-01 NOTE — Discharge Instructions (Signed)

## 2015-01-01 NOTE — ED Notes (Signed)
Pt has been told urine is needed for testing, pt is unable to urinate at this time. Urinal is at bedside.

## 2015-01-01 NOTE — ED Notes (Signed)
Pt ready for discharge, awaiting sister to come pick him up

## 2015-01-01 NOTE — ED Notes (Signed)
Pt resting in bed and knows he is at [place]. Pt agreeable.

## 2015-01-01 NOTE — ED Notes (Signed)
Family emergency contact called, no respond gotten message leaved.

## 2015-01-01 NOTE — ED Notes (Addendum)
Spoke with pt's sister- Valerio Pinard( [phone]) who is trying to find a ride to come here to pick up her brother.

## 2015-01-01 NOTE — ED Notes (Signed)
Pt brought to ED by [facility] after been found wandering on the road. Pt AO to him self only, unaware of where is his house, pt just gave old address referent. Hx of left side weakness and dementia. Family to be notified over the phone. VS. HR 76, BP 110/72, R -18.

## 2015-01-04 ENCOUNTER — Telehealth: Payer: Self-pay | Admitting: Adult Health

## 2015-01-04 ENCOUNTER — Other Ambulatory Visit: Payer: Self-pay | Admitting: Neurology

## 2015-01-04 NOTE — Telephone Encounter (Signed)
Patient sister, [name] stated she misplaced patient medication for Rx phenytoin (DILANTIN) 100 MG ER capsule, pt has only enough until 3/16.  Questioning if Rx could be forwarded to [place].  Please call and advise.

## 2015-01-04 NOTE — Telephone Encounter (Signed)
[facility] sent [place] a refill request for this med, which has already been sent.  I called back to advise. Got no answer.  Left message.

## 2015-01-07 ENCOUNTER — Ambulatory Visit (INDEPENDENT_AMBULATORY_CARE_PROVIDER_SITE_OTHER): Payer: Medicare Other | Admitting: Adult Health

## 2015-01-07 ENCOUNTER — Encounter: Payer: Self-pay | Admitting: Adult Health

## 2015-01-07 VITALS — BP 125/76 | HR 93 | Ht 69.0 in | Wt 166.0 lb

## 2015-01-07 DIAGNOSIS — R269 Unspecified abnormalities of gait and mobility: Secondary | ICD-10-CM | POA: Diagnosis not present

## 2015-01-07 DIAGNOSIS — R569 Unspecified convulsions: Secondary | ICD-10-CM | POA: Diagnosis not present

## 2015-01-07 DIAGNOSIS — R413 Other amnesia: Secondary | ICD-10-CM | POA: Diagnosis not present

## 2015-01-07 DIAGNOSIS — Z5181 Encounter for therapeutic drug level monitoring: Secondary | ICD-10-CM

## 2015-01-07 MED ORDER — MEMANTINE HCL 28 X 5 MG & 21 X 10 MG PO TABS: ORAL_TABLET | ORAL | Status: DC

## 2015-01-07 NOTE — Progress Notes (Signed)
I have read the note, and I agree with the clinical assessment and plan.  Dottie Vaquerano KEITH   

## 2015-01-07 NOTE — Progress Notes (Addendum)
PATIENT: WALTER GRIMA DOB: [birth year]  REASON FOR VISIT: follow up- seizures, memory disorder, abnormality of gait HISTORY FROM: patient  HISTORY OF PRESENT ILLNESS:  Mr. Sallade is a 64 year old male with a history of seizures and memory disorder. He returns today for follow-up. The patient is currently taken Dilantin and Keppra and is tolerating it well. The patient's sister is with him today. She is the caregiver. She states that he has not had any additional seizures. The patient is able to complete all ADLs independently but sister provides some help. He does not operate a motor vehicle. He continues to use a cane when ambulating. Denies any falls. The patient's sister states that his memory has gotten worse. She states that his long-term memory is intact but his short-term memory is not. She states that he can't remember where he put things. She states that during the night he will try to wander out of the house. His sister stays with him during the day and night. He is also on Seroquel.  HISTORY:Mr. Riviera is a 64 year old left-handed black male with a history of seizures. The patient has a history of cerebrovascular disease with left-sided deficit. The patient walks with a cane. The patient has been on Keppra and Dilantin. The caretaker indicates that the patient has been taking his medications, but he went to the emergency room on 07/28/2014 with a prolonged seizure. The patient was noted to have a Dilantin level that was essentially 0. The patient had been increased on his Dilantin approximately 6 months ago, and he was to come back in early June to recheck a level, but he never had the blood work done. The patient returns to this office for an evaluation. With the seizure, the patient fell and struck the left side of his head, sustaining a maxillary fracture.   REVIEW OF SYSTEMS: Out of a complete 14 system review of symptoms, the patient complains only of the following symptoms, and all  other reviewed systems are negative.  Runny nose, memory loss  ALLERGIES: No Known Allergies  HOME MEDICATIONS: Outpatient Prescriptions Prior to Visit  Medication Sig Dispense Refill  . aspirin EC 325 MG EC tablet Take 1 tablet (325 mg total) by mouth 2 (two) times daily after a meal. 30 tablet 0  . Cholecalciferol (VITAMIN D-3) 1000 UNITS CAPS Take 1,000 Units by mouth daily.     Marland Kitchen ibuprofen (ADVIL,MOTRIN) 200 MG tablet Take 400 mg by mouth 2 (two) times daily as needed for moderate pain.    Marland Kitchen levETIRAcetam (KEPPRA) 750 MG tablet Take 1 tablet (750 mg total) by mouth 2 (two) times daily. 60 tablet 5  . metoprolol tartrate (LOPRESSOR) 25 MG tablet Take 25 mg by mouth daily.    . phenytoin (DILANTIN) 100 MG ER capsule Take 1 capsule (100 mg total) by mouth 2 (two) times daily. 60 capsule 5  . phenytoin (DILANTIN) 100 MG ER capsule TAKE ONE CAPSULE BY MOUTH TWICE DAILY 180 capsule 0  . phenytoin (DILANTIN) 50 MG tablet Chew 1 tablet (50 mg total) by mouth at bedtime. 30 tablet 5  . QUEtiapine (SEROQUEL) 25 MG tablet Take 25 mg by mouth daily.    . simvastatin (ZOCOR) 20 MG tablet Take 20 mg by mouth every evening.     No facility-administered medications prior to visit.    PAST MEDICAL HISTORY: Past Medical History  Diagnosis Date  . History of alcoholism   . Hx of ischemic vertebrobasilar artery thalamic stroke  Right  . Gait disorder   . Dementia   . 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 Seizures   . Memory disorder 03/04/2013  . Gait disorder   . Stroke     Right frontal, right thalamic  . Ulnar neuropathy of left upper extremity   . Alcoholism     History of, not active    PAST SURGICAL HISTORY: Past Surgical History  Procedure Laterality Date  . Wrist surgery Left 95 & 96  . Hip arthroplasty Left 11/26/2013    Procedure: LEFT HIP HEMIARTHROPLASTY;  Surgeon: Mcarthur Rossetti, MD;  Location: [facility];  Service: Orthopedics;  Laterality: Left;     FAMILY HISTORY: Family History  Problem Relation Age of Onset  . Pulmonary embolism Mother   . Liver disease Father   . Diabetes Sister   . Hypertension Sister   . Seizures Neg Hx   . Hypertension Sister   . Hypertension Sister     SOCIAL HISTORY: History   Social History  . Marital Status: Divorced    Spouse Name: N/A  . Number of Children: 0  . Years of Education: 10   Occupational History  . Not on file.   Social History Main Topics  . Smoking status: Former Smoker -- 3.00 packs/day    Quit date: 01/02/2014  . Smokeless tobacco: Former Systems developer  . Alcohol Use: No     Comment: Former Alcoholic  . Drug Use: No  . Sexual Activity: Yes   Other Topics Concern  . Not on file   Social History Narrative   Patient is single and lives with a friend and her son.   Patient does not have any children.   Patient is disabled.   Patient has a 10 th grade education.   Patient is left-handed.   Patient drinks some soda but not everyday.      PHYSICAL EXAM  Filed Vitals:   01/07/15 1102  BP: 125/76  Pulse: 93  Height: 5\' 9"  (1.753 m)  Weight: 166 lb (75.297 kg)   Body mass index is 24.5 kg/(m^2).  Generalized: Well developed, in no acute distress   Neurological examination  Mentation: Alert.  Follows all commands speech and language fluent. MMSE 13/30 Cranial nerve II-XII: Pupils were equal round reactive to light. Extraocular movements were full, visual field were full on confrontational test. Facial sensation and strength were normal. Uvula tongue midline. Head turning and shoulder shrug  were normal and symmetric. Motor: The motor testing reveals 5 over 5 strength in the right upper and lower extreme. 4 over 5 strength in the left upper and lower extremity.Kermit Balo symmetric motor tone is noted throughout.  Sensory: Sensory testing is intact to soft touch on all 4 extremities. No evidence of extinction is noted.  Coordination: Cerebellar testing reveals good  finger-nose-finger and heel-to-shin bilaterally.  Gait and station: Uses a cane to ambulate..  Reflexes: Deep tendon reflexes are symmetric and normal bilaterally.    DIAGNOSTIC DATA (LABS, IMAGING, TESTING) - I reviewed patient records, labs, notes, testing and imaging myself where available.  Lab Results  Component Value Date   WBC 4.5 01/01/2015   HGB 12.8* 01/01/2015   HCT 36.5* 01/01/2015   MCV 90.8 01/01/2015   PLT 150 01/01/2015      Component Value Date/Time   NA 141 01/01/2015 0540   NA 140 03/04/2013 1305   K 3.9 01/01/2015 0540   CL 106 01/01/2015 0540   CO2 28  01/01/2015 0540   GLUCOSE 105* 01/01/2015 0540   GLUCOSE 68 03/04/2013 1305   BUN 12 01/01/2015 0540   BUN 16 03/04/2013 1305   CREATININE 0.89 01/01/2015 0540   CALCIUM 9.3 01/01/2015 0540   PROT 7.2 01/01/2015 0540   PROT 7.4 03/04/2013 1305   ALBUMIN 3.7 01/01/2015 0540   AST 22 01/01/2015 0540   ALT 19 01/01/2015 0540   ALKPHOS 138* 01/01/2015 0540   BILITOT 0.8 01/01/2015 0540   GFRNONAA 90* 01/01/2015 0540   GFRAA >90 01/01/2015 0540       ASSESSMENT AND PLAN 64 y.o. year old male  has a past medical history of History of alcoholism; ischemic vertebrobasilar artery thalamic stroke; Gait disorder; Dementia; Gingival hypertrophy; Seizures; Memory disorder (03/04/2013); Gait disorder; Stroke; Ulnar neuropathy of left upper extremity; and Alcoholism. here with:  1. Seizures 2. Memory loss 3. Abnormality of gait  Patient's seizures have been under control. He will continue taking Dilantin and Keppra. The patient did take his medication this morning and therefore he will come in next week for blood work. He was advised to not take his medication until after the blood work was completed. Patient and his sister verbalized understanding. Patient will continue to use his cane or walker to ambulate. The patient's memory has declined. His MMSE is 13/30. I will start the patient on Namenda. I have reviewed  the side effects as well as interactions with other medications with the family. If he is unable to tolerate Namenda XR will let us know. Otherwise he will follow-up in 6 months or sooner if needed.     Ward Givens, MSN, NP-C 01/07/2015, 11:32 AM [facility] [address] [place], [place] [address] [phone]  Note: This document was prepared with digital dictation and possible smart phrase technology. Any transcriptional errors that result from this process are unintentional.

## 2015-01-07 NOTE — Patient Instructions (Signed)
Continue Dilantin and Keppra Come in next week for blood work.- do not take dilantin before blood work. I will start namenda for his memory- below is information about this medication.  emantine Tablets What is this medicine? MEMANTINE (MEM an teen) is used to treat dementia caused by Alzheimer's disease. This medicine may be used for other purposes; ask your health care provider or pharmacist if you have questions. COMMON BRAND NAME(S): Namenda  What should I tell my health care provider before I take this medicine? They need to know if you have any of these conditions: -difficulty passing urine -kidney disease -liver disease -seizures -an unusual or allergic reaction to memantine, other medicines, foods, dyes, or preservatives -pregnant or trying to get pregnant -breast-feeding How should I use this medicine? Take this medicine by mouth with a glass of water. Follow the directions on the prescription label. You may take this medicine with or without food. Take your doses at regular intervals. Do not take your medicine more often than directed. Continue to take your medicine even if you feel better. Do not stop taking except on the advice of your doctor or health care professional. Talk to your pediatrician regarding the use of this medicine in children. Special care may be needed. Overdosage: If you think you have taken too much of this medicine contact a poison control center or emergency room at once. NOTE: This medicine is only for you. Do not share this medicine with others. What if I miss a dose? If you miss a dose, take it as soon as you can. If it is almost time for your next dose, take only that dose. Do not take double or extra doses. If you do not take your medicine for several days, contact your health care provider. Your dose may need to be changed. What may interact with this  medicine? -acetazolamide -amantadine -cimetidine -dextromethorphan -dofetilide -hydrochlorothiazide -ketamine -metformin -methazolamide -quinidine -ranitidine -sodium bicarbonate -triamterene This list may not describe all possible interactions. Give your health care provider a list of all the medicines, herbs, non-prescription drugs, or dietary supplements you use. Also tell them if you smoke, drink alcohol, or use illegal drugs. Some items may interact with your medicine. What should I watch for while using this medicine? Visit your doctor or health care professional for regular checks on your progress. Check with your doctor or health care professional if there is no improvement in your symptoms or if they get worse. You may get drowsy or dizzy. Do not drive, use machinery, or do anything that needs mental alertness until you know how this drug affects you. Do not stand or sit up quickly, especially if you are an older patient. This reduces the risk of dizzy or fainting spells. Alcohol can make you more drowsy and dizzy. Avoid alcoholic drinks. What side effects may I notice from receiving this medicine? Side effects that you should report to your doctor or health care professional as soon as possible: -allergic reactions like skin rash, itching or hives, swelling of the face, lips, or tongue -agitation or a feeling of restlessness -depressed mood -dizziness -hallucinations -redness, blistering, peeling or loosening of the skin, including inside the mouth -seizures -vomiting Side effects that usually do not require medical attention (report to your doctor or health care professional if they continue or are bothersome): -constipation -diarrhea -headache -nausea -trouble sleeping This list may not describe all possible side effects. Call your doctor for medical advice about side effects. You may report side effects  to FDA at 1-800-FDA-1088. Where should I keep my medicine? Keep  out of the reach of children. Store at room temperature between 15 degrees and 30 degrees C (59 degrees and 86 degrees F). Throw away any unused medicine after the expiration date. NOTE: This sheet is a summary. It may not cover all possible information. If you have questions about this medicine, talk to your doctor, pharmacist, or health care provider.  2015, Elsevier/Gold Standard. (2013-07-27 14:10:42)

## 2015-01-12 ENCOUNTER — Other Ambulatory Visit (INDEPENDENT_AMBULATORY_CARE_PROVIDER_SITE_OTHER): Payer: Self-pay

## 2015-01-12 DIAGNOSIS — Z5181 Encounter for therapeutic drug level monitoring: Secondary | ICD-10-CM | POA: Diagnosis not present

## 2015-01-12 DIAGNOSIS — Z0289 Encounter for other administrative examinations: Secondary | ICD-10-CM

## 2015-01-13 ENCOUNTER — Telehealth: Payer: Self-pay | Admitting: Adult Health

## 2015-01-13 LAB — COMPREHENSIVE METABOLIC PANEL
A/G RATIO: 1.4 (ref 1.1–2.5)
ALBUMIN: 4.2 g/dL (ref 3.6–4.8)
ALK PHOS: 179 IU/L — AB (ref 39–117)
ALT: 21 IU/L (ref 0–44)
AST: 19 IU/L (ref 0–40)
BUN / CREAT RATIO: 10 (ref 10–22)
BUN: 8 mg/dL (ref 8–27)
Bilirubin Total: 0.3 mg/dL (ref 0.0–1.2)
CO2: 24 mmol/L (ref 18–29)
CREATININE: 0.78 mg/dL (ref 0.76–1.27)
Calcium: 9.3 mg/dL (ref 8.6–10.2)
Chloride: 100 mmol/L (ref 97–108)
GFR calc Af Amer: 112 mL/min/{1.73_m2} (ref >59)
GFR, EST NON AFRICAN AMERICAN: 97 mL/min/{1.73_m2} (ref >59)
GLOBULIN, TOTAL: 3 g/dL (ref 1.5–4.5)
Glucose: 106 mg/dL — ABNORMAL HIGH (ref 65–99)
Potassium: 3.8 mmol/L (ref 3.5–5.2)
Sodium: 143 mmol/L (ref 134–144)
Total Protein: 7.2 g/dL (ref 6.0–8.5)

## 2015-01-13 LAB — CBC WITH DIFFERENTIAL/PLATELET
BASOS: 0 %
Basophils Absolute: 0 10*3/uL (ref 0.0–0.2)
EOS: 6 %
Eosinophils Absolute: 0.3 10*3/uL (ref 0.0–0.4)
HEMATOCRIT: 41.9 % (ref 37.5–51.0)
HEMOGLOBIN: 14.2 g/dL (ref 12.6–17.7)
IMMATURE GRANULOCYTES: 0 %
Immature Grans (Abs): 0 10*3/uL (ref 0.0–0.1)
Lymphocytes Absolute: 1.3 10*3/uL (ref 0.7–3.1)
Lymphs: 26 %
MCH: 32.3 pg (ref 26.6–33.0)
MCHC: 33.9 g/dL (ref 31.5–35.7)
MCV: 95 fL (ref 79–97)
Monocytes Absolute: 0.4 10*3/uL (ref 0.1–0.9)
Monocytes: 8 %
NEUTROS ABS: 3 10*3/uL (ref 1.4–7.0)
NEUTROS PCT: 60 %
Platelets: 189 10*3/uL (ref 150–379)
RBC: 4.4 x10E6/uL (ref 4.14–5.80)
RDW: 14 % (ref 12.3–15.4)
WBC: 5 10*3/uL (ref 3.4–10.8)

## 2015-01-13 LAB — PHENYTOIN LEVEL, TOTAL: PHENYTOIN LVL: 25.9 ug/mL — AB (ref 10.0–20.0)

## 2015-01-13 NOTE — Telephone Encounter (Signed)
I called the patient and spoke to his sister [name]. I advised that his Dilantin level was elevated. We will decrease his Dilantin 200 mg twice a day. His alkaline phosphatase was also elevated. However in the past that has been as high as 190. We will continue to monitor this. If it continues to increase Dilantin may have to be discontinued. We will check blood work in 3 weeks. If he has any seizure events he was advised to let us know.

## 2015-01-31 ENCOUNTER — Other Ambulatory Visit: Payer: Self-pay | Admitting: Neurology

## 2015-02-03 ENCOUNTER — Telehealth: Payer: Self-pay | Admitting: Adult Health

## 2015-02-03 DIAGNOSIS — Z5181 Encounter for therapeutic drug level monitoring: Secondary | ICD-10-CM

## 2015-02-03 NOTE — Telephone Encounter (Signed)
Patient needs lab work to recheck dilantin level as well as alkaline phosphatase. I have placed orders. Please call the patient and have him come in. Remind him not to take his Dilantin medication until after the lab work.

## 2015-02-04 ENCOUNTER — Telehealth: Payer: Self-pay | Admitting: Adult Health

## 2015-02-04 MED ORDER — MEMANTINE HCL 10 MG PO TABS: 10.0000 mg | ORAL_TABLET | Freq: Two times a day (BID) | ORAL | Status: DC

## 2015-02-04 NOTE — Telephone Encounter (Signed)
Pt is calling needing refill for memantine (NAMENDA TITRATION PAK) tablet pack to be called in to [facility] on [address].  Please call and advise.

## 2015-02-04 NOTE — Telephone Encounter (Signed)
Patient has completed titration.  Has been taking Namenda IR per titration kit sent to pharm at [place].  Maint dose has been sent to the pharmacy.  I called back to advise. Got no answer.  Left message.

## 2015-02-08 DIAGNOSIS — H18423 Band keratopathy, bilateral: Secondary | ICD-10-CM | POA: Diagnosis not present

## 2015-02-08 DIAGNOSIS — H18413 Arcus senilis, bilateral: Secondary | ICD-10-CM | POA: Diagnosis not present

## 2015-02-08 DIAGNOSIS — H2513 Age-related nuclear cataract, bilateral: Secondary | ICD-10-CM | POA: Diagnosis not present

## 2015-02-08 DIAGNOSIS — H1713 Central corneal opacity, bilateral: Secondary | ICD-10-CM | POA: Diagnosis not present

## 2015-02-08 DIAGNOSIS — H11153 Pinguecula, bilateral: Secondary | ICD-10-CM | POA: Diagnosis not present

## 2015-02-14 DIAGNOSIS — H18423 Band keratopathy, bilateral: Secondary | ICD-10-CM | POA: Diagnosis not present

## 2015-02-21 ENCOUNTER — Other Ambulatory Visit (INDEPENDENT_AMBULATORY_CARE_PROVIDER_SITE_OTHER): Payer: Self-pay

## 2015-02-21 DIAGNOSIS — Z0289 Encounter for other administrative examinations: Secondary | ICD-10-CM

## 2015-02-21 DIAGNOSIS — Z5181 Encounter for therapeutic drug level monitoring: Secondary | ICD-10-CM | POA: Diagnosis not present

## 2015-02-22 LAB — COMPREHENSIVE METABOLIC PANEL
ALBUMIN: 4.4 g/dL (ref 3.6–4.8)
ALT: 18 IU/L (ref 0–44)
AST: 18 IU/L (ref 0–40)
Albumin/Globulin Ratio: 1.6 (ref 1.1–2.5)
Alkaline Phosphatase: 174 IU/L — ABNORMAL HIGH (ref 39–117)
BUN/Creatinine Ratio: 12 (ref 10–22)
BUN: 12 mg/dL (ref 8–27)
Bilirubin Total: 0.3 mg/dL (ref 0.0–1.2)
CALCIUM: 9.4 mg/dL (ref 8.6–10.2)
CHLORIDE: 107 mmol/L (ref 97–108)
CO2: 25 mmol/L (ref 18–29)
CREATININE: 0.98 mg/dL (ref 0.76–1.27)
GFR calc Af Amer: 95 mL/min/{1.73_m2} (ref >59)
GFR calc non Af Amer: 82 mL/min/{1.73_m2} (ref >59)
Globulin, Total: 2.7 g/dL (ref 1.5–4.5)
Glucose: 80 mg/dL (ref 65–99)
Potassium: 5.2 mmol/L (ref 3.5–5.2)
Sodium: 149 mmol/L — ABNORMAL HIGH (ref 134–144)
TOTAL PROTEIN: 7.1 g/dL (ref 6.0–8.5)

## 2015-02-22 LAB — PHENYTOIN LEVEL, TOTAL: PHENYTOIN LVL: 18.1 ug/mL (ref 10.0–20.0)

## 2015-02-23 ENCOUNTER — Ambulatory Visit: Payer: Medicare Other | Attending: Internal Medicine | Admitting: Internal Medicine

## 2015-02-23 ENCOUNTER — Encounter: Payer: Self-pay | Admitting: Internal Medicine

## 2015-02-23 VITALS — BP 110/73 | HR 63 | Temp 97.9°F | Resp 14 | Ht 70.0 in | Wt 169.0 lb

## 2015-02-23 DIAGNOSIS — R269 Unspecified abnormalities of gait and mobility: Secondary | ICD-10-CM | POA: Insufficient documentation

## 2015-02-23 DIAGNOSIS — F1110 Opioid abuse, uncomplicated: Secondary | ICD-10-CM | POA: Insufficient documentation

## 2015-02-23 DIAGNOSIS — Z125 Encounter for screening for malignant neoplasm of prostate: Secondary | ICD-10-CM | POA: Insufficient documentation

## 2015-02-23 DIAGNOSIS — I10 Essential (primary) hypertension: Secondary | ICD-10-CM | POA: Insufficient documentation

## 2015-02-23 DIAGNOSIS — R160 Hepatomegaly, not elsewhere classified: Secondary | ICD-10-CM | POA: Insufficient documentation

## 2015-02-23 DIAGNOSIS — F1111 Opioid abuse, in remission: Secondary | ICD-10-CM

## 2015-02-23 DIAGNOSIS — E785 Hyperlipidemia, unspecified: Secondary | ICD-10-CM | POA: Diagnosis not present

## 2015-02-23 NOTE — Progress Notes (Signed)
Patient ID: [name], male   DOB: 1951/06/19, 64 y.o.   MRN: [id number]  [id number]  [id number]  DOB - Sep 30, 1951  CC:  Chief Complaint  Patient presents with  . Establish Care       HPI: [name] is a 64 y.o. male here today to establish medical care.  Patient has a past medical history of drug abuse, seizure disorder, CVA (2 years ago). Patient reports that he is up to date on his colonoscopy and states that it was normal at that time.  He is a current 1 ppd smoker. He is interested in smoking cessation and has been reading about Wellbutrin use.  Patient has a current Neurologist who has him on Keppra and Namenda for memory loss. His last reported seizure was 3-4 months ago. His last doctor came to his house for visits but he states that he was unable to get in contact with doctor for refills.  Patient reports that his eye doctor found calcifications of his eye last week and was told that he will need blood test from his PCP He is unsure of the exactly blood test today.   Patient has No headache, No chest pain, No abdominal pain - No Nausea, No new weakness tingling or numbness, No Cough - SOB.  No Known Allergies Past Medical History  Diagnosis Date  . History of alcoholism   . Hx of ischemic vertebrobasilar artery thalamic stroke     Right  . Gait disorder   . Dementia   . 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 Seizures   . Memory disorder 03/04/2013  . Gait disorder   . Stroke     Right frontal, right thalamic  . Ulnar neuropathy of left upper extremity   . Alcoholism     History of, not active   Current Outpatient Prescriptions on File Prior to Visit  Medication Sig Dispense Refill  . aspirin EC 325 MG EC tablet Take 1 tablet (325 mg total) by mouth 2 (two) times daily after a meal. 30 tablet 0  . Cholecalciferol (VITAMIN D-3) 1000 UNITS CAPS Take 1,000 Units by mouth daily.     Marland Kitchen ibuprofen (ADVIL,MOTRIN) 200 MG tablet Take 400 mg by mouth 2  (two) times daily as needed for moderate pain.    Marland Kitchen levETIRAcetam (KEPPRA) 750 MG tablet TAKE 1 TABLET (750 MG TOTAL) BY MOUTH 2 (TWO) TIMES DAILY. 60 tablet 5  . memantine (NAMENDA TITRATION PAK) tablet pack 5 mg/day for =1 week; 5 mg twice daily for =1 week; 15 mg/day given in 5 mg and 10 mg separated doses for =1 week; then 10 mg twice daily 49 tablet 0  . memantine (NAMENDA) 10 MG tablet Take 1 tablet (10 mg total) by mouth 2 (two) times daily. 60 tablet 3  . metoprolol tartrate (LOPRESSOR) 25 MG tablet Take 25 mg by mouth daily.    . phenytoin (DILANTIN) 100 MG ER capsule Take 1 capsule (100 mg total) by mouth 2 (two) times daily. 60 capsule 5  . phenytoin (DILANTIN) 100 MG ER capsule TAKE ONE CAPSULE BY MOUTH TWICE DAILY 180 capsule 0  . simvastatin (ZOCOR) 20 MG tablet Take 20 mg by mouth every evening.    Marland Kitchen QUEtiapine (SEROQUEL) 25 MG tablet Take 25 mg by mouth daily.     No current facility-administered medications on file prior to visit.   Family History  Problem Relation Age of Onset  . Pulmonary embolism Mother   .  Liver disease Father   . Diabetes Sister   . Hypertension Sister   . Seizures Neg Hx   . Hypertension Sister   . Hypertension Sister    History   Social History  . Marital Status: Divorced    Spouse Name: N/A  . Number of Children: 0  . Years of Education: 10   Occupational History  . Not on file.   Social History Main Topics  . Smoking status: Former Smoker -- 3.00 packs/day    Quit date: 01/02/2014  . Smokeless tobacco: Former Systems developer  . Alcohol Use: No     Comment: Former Alcoholic  . Drug Use: No  . Sexual Activity: Yes   Other Topics Concern  . Not on file   Social History Narrative   Patient is single and lives with a friend and her son.   Patient does not have any children.   Patient is disabled.   Patient has a 10 th grade education.   Patient is left-handed.   Patient drinks some soda but not everyday.    Review of Systems    Constitutional: Negative.   HENT: Negative.   Eyes: Negative.   Respiratory: Negative.   Cardiovascular: Negative.   Gastrointestinal: Negative.   Genitourinary: Negative.        Some hesitancy No dribbling, frequency, or incomplete evacuation  Musculoskeletal: Positive for falls (not in past 4 months).       Broken hip last year--Left hip replacement 11/2013  Skin: Negative.   Neurological: Positive for seizures. Negative for dizziness.  Psychiatric/Behavioral: Positive for memory loss. Negative for depression, suicidal ideas and hallucinations. The patient does not have insomnia.       Objective:   Filed Vitals:   02/23/15 1420  BP: 110/73  Pulse: 63  Temp: 97.9 F (36.6 C)  Resp: 14    Physical Exam: Constitutional: Patient appears well-developed and well-nourished. No distress. HENT: Normocephalic, atraumatic, External right and left ear normal. Oropharynx is clear and moist.  Eyes: Conjunctivae and EOM are normal. PERRLA, no scleral icterus. Neck: Normal ROM. Neck supple. No JVD. No tracheal deviation. No thyromegaly. CVS: RRR, S1/S2 +, no murmurs, no gallops, no carotid bruit.  Pulmonary: Effort and breath sounds normal, no stridor, rhonchi, wheezes, rales.  Abdominal: Soft. BS +, no distension, tenderness, rebound or guarding.  Musculoskeletal: abnormal/unsteady gait, uses cane to ambulate  Lymphadenopathy: No lymphadenopathy noted, cervical, inguinal or axillary Neuro: Alert. Normal reflexes, muscle tone coordination. No cranial nerve deficit. Skin: Skin is warm and dry. No rash noted. Not diaphoretic. No erythema. No pallor. Psychiatric: Normal mood and affect. Behavior, judgment, thought content normal.  Lab Results  Component Value Date   WBC 5.0 01/12/2015   HGB 14.2 01/12/2015   HCT 41.9 01/12/2015   MCV 95 01/12/2015   PLT 189 01/12/2015   Lab Results  Component Value Date   CREATININE 0.98 02/21/2015   BUN 12 02/21/2015   NA 149* 02/21/2015   K  5.2 02/21/2015   CL 107 02/21/2015   CO2 25 02/21/2015    No results found for: HGBA1C Lipid Panel  No results found for: CHOL, TRIG, HDL, CHOLHDL, VLDL, LDLCALC     Assessment and plan:   Yida was seen today for establish care.  Diagnoses and all orders for this visit:  Essential hypertension Controlled on Lopressor. Continue current regimen   HLD (hyperlipidemia) Orders: -     TSH; Future -     Lipid panel; Future  Hepatomegaly  Orders: -     Hepatitis panel, acute; Future -     Hepatitis C RNA quantitative; Future Will check for Hepatitis given his past medical history   History of heroin abuse See above   Prostate cancer screening Orders: -     PSA, Medicare; Future  Abnormality of gait Continue with Neurology    Return in about 3 months (around 05/26/2015) for Hypertension.   Chari Manning, NP-C [facility] [phone] 02/23/2015, 2:37 PM

## 2015-02-23 NOTE — Patient Instructions (Signed)
Have Dr. Katy Fitch to write order for blood test on prescription pad

## 2015-02-23 NOTE — Progress Notes (Signed)
Pt is here today to establish care. Pt has a history of HTN, seizures, hyperlipidemia and alcohol  abuse.

## 2015-03-02 ENCOUNTER — Other Ambulatory Visit: Payer: Medicare Other

## 2015-03-02 ENCOUNTER — Ambulatory Visit: Payer: Medicare Other | Attending: Internal Medicine

## 2015-03-02 DIAGNOSIS — Z125 Encounter for screening for malignant neoplasm of prostate: Secondary | ICD-10-CM

## 2015-03-02 DIAGNOSIS — E785 Hyperlipidemia, unspecified: Secondary | ICD-10-CM

## 2015-03-02 DIAGNOSIS — F1111 Opioid abuse, in remission: Secondary | ICD-10-CM

## 2015-03-02 DIAGNOSIS — R160 Hepatomegaly, not elsewhere classified: Secondary | ICD-10-CM

## 2015-03-03 ENCOUNTER — Telehealth: Payer: Self-pay | Admitting: *Deleted

## 2015-03-03 LAB — HEPATITIS PANEL, ACUTE
HCV Ab: REACTIVE — AB
HEP B C IGM: NONREACTIVE
HEP B S AG: NEGATIVE
Hep A IgM: NONREACTIVE

## 2015-03-03 LAB — PSA, MEDICARE: PSA: 0.62 ng/mL (ref <=4.00)

## 2015-03-03 LAB — LIPID PANEL
CHOLESTEROL: 125 mg/dL (ref 0–200)
HDL: 45 mg/dL (ref >=40)
LDL Cholesterol: 59 mg/dL (ref 0–99)
TRIGLYCERIDES: 106 mg/dL (ref <150)
Total CHOL/HDL Ratio: 2.8 Ratio
VLDL: 21 mg/dL (ref 0–40)

## 2015-03-03 LAB — TSH: TSH: 2.292 u[IU]/mL (ref 0.350–4.500)

## 2015-03-03 NOTE — Telephone Encounter (Signed)
Results given, patient has no concerns at this time.  Thanks

## 2015-03-03 NOTE — Telephone Encounter (Signed)
Left message for pt to return my call. Gave [facility] phone number and office hours. I was calling about results.

## 2015-03-05 LAB — HEPATITIS C RNA QUANTITATIVE
HCV Quantitative Log: 6.53 {Log} — ABNORMAL HIGH (ref <1.18)
HCV Quantitative: 3354973 IU/mL — ABNORMAL HIGH (ref <15)

## 2015-03-07 LAB — HEPATITIS C RNA QUANTITATIVE
HCV QUANT LOG: 6.53 {Log} — AB (ref <1.18)
HCV Quantitative: 3406759 IU/mL — ABNORMAL HIGH (ref <15)

## 2015-03-22 ENCOUNTER — Encounter: Payer: Self-pay | Admitting: Internal Medicine

## 2015-03-22 ENCOUNTER — Other Ambulatory Visit: Payer: Self-pay | Admitting: Internal Medicine

## 2015-03-22 ENCOUNTER — Telehealth: Payer: Self-pay | Admitting: Internal Medicine

## 2015-03-22 ENCOUNTER — Other Ambulatory Visit: Payer: Medicare Other

## 2015-03-22 DIAGNOSIS — B182 Chronic viral hepatitis C: Secondary | ICD-10-CM | POA: Insufficient documentation

## 2015-03-22 DIAGNOSIS — R768 Other specified abnormal immunological findings in serum: Secondary | ICD-10-CM

## 2015-03-22 DIAGNOSIS — H18423 Band keratopathy, bilateral: Secondary | ICD-10-CM | POA: Insufficient documentation

## 2015-03-22 NOTE — Telephone Encounter (Signed)
Patient called RN line and asked about test results.

## 2015-03-22 NOTE — Telephone Encounter (Signed)
-----   Message from Lance Bosch, NP sent at 03/21/2015 12:54 PM EDT ----- All labs within normal limits except for patient is positive for hepatitis C. Please send referral to infectious disease. May explain with hepatitis C as the patient

## 2015-03-22 NOTE — Telephone Encounter (Signed)
Results are already posted. Please look in inbox. Thanks

## 2015-03-22 NOTE — Telephone Encounter (Signed)
Results relayed to patient and informed of infectious disease referral.  Patient had no questions.

## 2015-03-22 NOTE — Telephone Encounter (Signed)
Pt is calling to inquire about results. Please follow up with pt.

## 2015-03-22 NOTE — Progress Notes (Signed)
Patient ID: [name], male   DOB: 18-Jun-1951, 64 y.o.   MRN: [id number]  Received letter from patients ophthalmologist stating that he has been diagnosed with Band shaped Keratopathy OU and he is currently awaiting surgery. They are requesting labs to be drawn on patient and faxed over to their office.  Dr. Georgeann Oppenheim with [facility]

## 2015-03-23 ENCOUNTER — Other Ambulatory Visit: Payer: Self-pay | Admitting: Internal Medicine

## 2015-03-23 ENCOUNTER — Ambulatory Visit: Payer: Medicare Other | Attending: Internal Medicine

## 2015-03-23 DIAGNOSIS — B1920 Unspecified viral hepatitis C without hepatic coma: Secondary | ICD-10-CM

## 2015-03-23 DIAGNOSIS — H18423 Band keratopathy, bilateral: Secondary | ICD-10-CM | POA: Diagnosis not present

## 2015-03-23 LAB — COMPLETE METABOLIC PANEL WITH GFR
ALT: 19 U/L (ref 0–53)
AST: 21 U/L (ref 0–37)
Albumin: 4.2 g/dL (ref 3.5–5.2)
Alkaline Phosphatase: 150 U/L — ABNORMAL HIGH (ref 39–117)
BUN: 11 mg/dL (ref 6–23)
CO2: 26 meq/L (ref 19–32)
Calcium: 9 mg/dL (ref 8.4–10.5)
Chloride: 103 mEq/L (ref 96–112)
Creat: 0.9 mg/dL (ref 0.50–1.35)
GFR, Est African American: >89 mL/min
GLUCOSE: 96 mg/dL (ref 70–99)
Potassium: 4.3 mEq/L (ref 3.5–5.3)
SODIUM: 143 meq/L (ref 135–145)
Total Bilirubin: 0.4 mg/dL (ref 0.2–1.2)
Total Protein: 7 g/dL (ref 6.0–8.3)

## 2015-03-23 LAB — IRON: Iron: 106 ug/dL (ref 42–165)

## 2015-03-23 LAB — MAGNESIUM: Magnesium: 1.7 mg/dL (ref 1.5–2.5)

## 2015-03-23 LAB — URIC ACID: URIC ACID, SERUM: 5 mg/dL (ref 4.0–7.8)

## 2015-03-23 LAB — PHOSPHORUS: Phosphorus: 4.4 mg/dL (ref 2.3–4.6)

## 2015-03-24 LAB — HEPATITIS B CORE ANTIBODY, TOTAL: Hep B Core Total Ab: REACTIVE — AB

## 2015-03-24 LAB — PTH, INTACT AND CALCIUM
Calcium: 9 mg/dL (ref 8.4–10.5)
PTH: 42 pg/mL (ref 14–64)

## 2015-03-24 LAB — ANA: ANA: NEGATIVE

## 2015-03-24 LAB — HEPATITIS A ANTIBODY, TOTAL: Hep A Total Ab: NONREACTIVE

## 2015-03-24 LAB — PROTIME-INR
INR: 1.08 (ref <1.50)
PROTHROMBIN TIME: 14 s (ref 11.6–15.2)

## 2015-03-24 LAB — HIV ANTIBODY (ROUTINE TESTING W REFLEX): HIV 1&2 Ab, 4th Generation: NONREACTIVE

## 2015-03-25 LAB — HEPATITIS C GENOTYPE

## 2015-03-28 DIAGNOSIS — H01024 Squamous blepharitis left upper eyelid: Secondary | ICD-10-CM | POA: Diagnosis not present

## 2015-03-28 DIAGNOSIS — H18423 Band keratopathy, bilateral: Secondary | ICD-10-CM | POA: Diagnosis not present

## 2015-03-28 DIAGNOSIS — H2513 Age-related nuclear cataract, bilateral: Secondary | ICD-10-CM | POA: Diagnosis not present

## 2015-03-28 DIAGNOSIS — H01025 Squamous blepharitis left lower eyelid: Secondary | ICD-10-CM | POA: Diagnosis not present

## 2015-03-28 DIAGNOSIS — H01022 Squamous blepharitis right lower eyelid: Secondary | ICD-10-CM | POA: Diagnosis not present

## 2015-03-28 DIAGNOSIS — H01021 Squamous blepharitis right upper eyelid: Secondary | ICD-10-CM | POA: Diagnosis not present

## 2015-04-03 ENCOUNTER — Other Ambulatory Visit: Payer: Self-pay | Admitting: Neurology

## 2015-04-27 ENCOUNTER — Ambulatory Visit (INDEPENDENT_AMBULATORY_CARE_PROVIDER_SITE_OTHER): Payer: Medicare Other | Admitting: Internal Medicine

## 2015-04-27 ENCOUNTER — Encounter: Payer: Self-pay | Admitting: Internal Medicine

## 2015-04-27 VITALS — BP 119/68 | HR 59 | Temp 98.2°F | Ht 69.0 in | Wt 167.0 lb

## 2015-04-27 DIAGNOSIS — B1920 Unspecified viral hepatitis C without hepatic coma: Secondary | ICD-10-CM | POA: Diagnosis not present

## 2015-04-27 DIAGNOSIS — B182 Chronic viral hepatitis C: Secondary | ICD-10-CM | POA: Diagnosis not present

## 2015-04-27 NOTE — Progress Notes (Signed)
+[name] is a 64 y.o. male who presents for initial evaluation and management of a positive Hepatitis C antibody test.  Patient tested positive this year during PCP follow up. Hepatitis C risk factors present are: IV drug abuse (details: over 30 years ago). Patient denies multiple sexual partners, renal dialysis, sexual contact with person with liver disease, tattoos. Patient has had other studies performed. Results: hepatitis C RNA by PCR, result: positive. Patient has not had prior treatment for Hepatitis C. Patient does not have a past history of liver disease. Patient does have a family history of liver disease.   HPI: His father developed cirrhosis due to alcohol abuse.  No history of liver cancer.    Patient does not have documented immunity to Hepatitis A. Patient does have documented immunity to Hepatitis B.     Review of Systems A comprehensive review of systems was negative.   Past Medical History  Diagnosis Date  . History of alcoholism   . Hx of ischemic vertebrobasilar artery thalamic stroke     Right  . Gait disorder   . Dementia   . 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 Seizures   . Memory disorder 03/04/2013  . Gait disorder   . Stroke     Right frontal, right thalamic  . Ulnar neuropathy of left upper extremity   . Alcoholism     History of, not active    Prior to Admission medications   Medication Sig Start Date End Date Taking? Authorizing Provider  aspirin EC 325 MG EC tablet Take 1 tablet (325 mg total) by mouth 2 (two) times daily after a meal. 11/28/13  Yes Mcarthur Rossetti, MD  Cholecalciferol (VITAMIN D-3) 1000 UNITS CAPS Take 1,000 Units by mouth daily.    Yes Historical Provider, MD  ibuprofen (ADVIL,MOTRIN) 200 MG tablet Take 400 mg by mouth 2 (two) times daily as needed for moderate pain.   Yes Historical Provider, MD  levETIRAcetam (KEPPRA) 750 MG tablet TAKE 1 TABLET (750 MG TOTAL) BY MOUTH 2 (TWO) TIMES DAILY. 01/31/15  Yes  Kathrynn Ducking, MD  memantine [facility] TITRATION PAK) tablet pack 5 mg/day for =1 week; 5 mg twice daily for =1 week; 15 mg/day given in 5 mg and 10 mg separated doses for =1 week; then 10 mg twice daily 01/07/15  Yes Ward Givens, NP  memantine (NAMENDA) 10 MG tablet Take 1 tablet (10 mg total) by mouth 2 (two) times daily. 02/04/15  Yes Ward Givens, NP  metoprolol tartrate (LOPRESSOR) 25 MG tablet Take 25 mg by mouth daily.   Yes Historical Provider, MD  phenytoin (DILANTIN) 100 MG ER capsule Take 1 capsule (100 mg total) by mouth 2 (two) times daily. 08/03/14  Yes Kathrynn Ducking, MD  QUEtiapine (SEROQUEL) 25 MG tablet Take 25 mg by mouth daily. 08/02/14  Yes Historical Provider, MD  simvastatin (ZOCOR) 20 MG tablet Take 20 mg by mouth every evening.   Yes Historical Provider, MD    No Known Allergies  History  Substance Use Topics  . Smoking status: Former Smoker -- 3.00 packs/day    Quit date: 01/02/2014  . Smokeless tobacco: Former Systems developer  . Alcohol Use: No     Comment: Former Alcoholic    Family History  Problem Relation Age of Onset  . Pulmonary embolism Mother   . Liver disease Father   . Diabetes Sister   . Hypertension Sister   . Seizures Neg Hx   .  Hypertension Sister   . Hypertension Sister       Objective:   Filed Vitals:   04/27/15 1347  BP: 119/68  Pulse: 59  Temp: 98.2 F (36.8 C)   in no apparent distress and alert HEENT: anicteric Cor RRR and No murmurs clear Bowel sounds are normal, liver is not enlarged, spleen is not enlarged peripheral pulses normal, no pedal edema, no clubbing or cyanosis negative for - jaundice, spider hemangioma, telangiectasia, palmar erythema, ecchymosis and atrophy  Laboratory Genotype:  Lab Results  Component Value Date   HCVGENOTYPE 1a 03/23/2015   HCV viral load:  Lab Results  Component Value Date   HCVQUANT [id number]* 03/02/2015   HCVQUANT [id number]* 03/02/2015   Lab Results  Component Value Date   WBC  5.0 01/12/2015   HGB 14.2 01/12/2015   HCT 41.9 01/12/2015   MCV 95 01/12/2015   PLT 189 01/12/2015    Lab Results  Component Value Date   CREATININE 0.90 03/23/2015   BUN 11 03/23/2015   NA 143 03/23/2015   K 4.3 03/23/2015   CL 103 03/23/2015   CO2 26 03/23/2015    Lab Results  Component Value Date   ALT 19 03/23/2015   AST 21 03/23/2015   ALKPHOS 150* 03/23/2015   BILITOT 0.4 03/23/2015   INR 1.08 03/23/2015      Assessment: Chronic Hepatitis C genotype 1a  Plan: 1) Patient counseled extensively on limiting acetaminophen to no more than 2 grams daily, avoidance of alcohol. 2) Transmission discussed with patient including sexual transmission, sharing razors and toothbrush.   3) Will need referral to gastroenterology if concern for cirrhosis 4) Will need referral for substance abuse counseling: No. 5) Will prescribe Harvoni if he is able to change his dilantin.   6) Hepatitis A vaccine Yes.  will do next visit.  7) Hepatitis B vaccine No. 8) Pneumovax vaccine if concern for cirrhosis 9) will follow up after elastography.  If he has low or no fibrosis, will defer treatment due to Dilantin which is contraindicated with all available oral treatments.  If he has significant fibrosis or cirrhosis, will discuss with his primary neurologist if he is able to use a different antisz medication.

## 2015-05-16 ENCOUNTER — Telehealth: Payer: Self-pay | Admitting: Internal Medicine

## 2015-05-16 DIAGNOSIS — H01025 Squamous blepharitis left lower eyelid: Secondary | ICD-10-CM | POA: Diagnosis not present

## 2015-05-16 DIAGNOSIS — H01024 Squamous blepharitis left upper eyelid: Secondary | ICD-10-CM | POA: Diagnosis not present

## 2015-05-16 DIAGNOSIS — H01022 Squamous blepharitis right lower eyelid: Secondary | ICD-10-CM | POA: Diagnosis not present

## 2015-05-16 DIAGNOSIS — H2513 Age-related nuclear cataract, bilateral: Secondary | ICD-10-CM | POA: Diagnosis not present

## 2015-05-16 DIAGNOSIS — H18423 Band keratopathy, bilateral: Secondary | ICD-10-CM | POA: Diagnosis not present

## 2015-05-16 DIAGNOSIS — H01021 Squamous blepharitis right upper eyelid: Secondary | ICD-10-CM | POA: Diagnosis not present

## 2015-05-16 NOTE — Telephone Encounter (Signed)
Patient came into office requesting paperwork from ophthalmologist to be faxed back to get clearance for surgery.

## 2015-05-24 ENCOUNTER — Ambulatory Visit (HOSPITAL_COMMUNITY): Payer: Medicare Other

## 2015-05-26 ENCOUNTER — Ambulatory Visit: Payer: Medicare Other | Admitting: Internal Medicine

## 2015-06-02 ENCOUNTER — Other Ambulatory Visit: Payer: Self-pay | Admitting: Adult Health

## 2015-06-02 ENCOUNTER — Ambulatory Visit: Payer: Medicare Other | Admitting: Internal Medicine

## 2015-06-07 ENCOUNTER — Other Ambulatory Visit: Payer: Self-pay | Admitting: Adult Health

## 2015-06-20 ENCOUNTER — Encounter: Payer: Self-pay | Admitting: Internal Medicine

## 2015-06-20 ENCOUNTER — Ambulatory Visit (HOSPITAL_BASED_OUTPATIENT_CLINIC_OR_DEPARTMENT_OTHER): Payer: Medicare Other | Admitting: Internal Medicine

## 2015-06-20 VITALS — BP 115/74 | HR 64 | Temp 98.0°F | Resp 15 | Ht 69.0 in | Wt 169.0 lb

## 2015-06-20 DIAGNOSIS — Z720 Tobacco use: Secondary | ICD-10-CM

## 2015-06-20 DIAGNOSIS — E785 Hyperlipidemia, unspecified: Secondary | ICD-10-CM

## 2015-06-20 DIAGNOSIS — I10 Essential (primary) hypertension: Secondary | ICD-10-CM | POA: Insufficient documentation

## 2015-06-20 MED ORDER — METOPROLOL SUCCINATE ER 25 MG PO TB24: 25.0000 mg | ORAL_TABLET | Freq: Every day | ORAL | Status: DC

## 2015-06-20 MED ORDER — SIMVASTATIN 20 MG PO TABS: 20.0000 mg | ORAL_TABLET | Freq: Every evening | ORAL | Status: DC

## 2015-06-20 MED ORDER — NICOTINE 21 MG/24HR TD PT24: 21.0000 mg | MEDICATED_PATCH | Freq: Every day | TRANSDERMAL | Status: DC

## 2015-06-20 NOTE — Progress Notes (Signed)
Patient ID: ARGUS CARAHER, male   DOB: April 14, 1951, 64 y.o.   MRN: [id number] Subjective:  [name] is a 64 y.o. male with hypertension. Patient reports that he is scheduled to have his eye surgery on September 13th for cataract removal.   Current Outpatient Prescriptions  Medication Sig Dispense Refill  . aspirin EC 325 MG EC tablet Take 1 tablet (325 mg total) by mouth 2 (two) times daily after a meal. 30 tablet 0  . Cholecalciferol (VITAMIN D-3) 1000 UNITS CAPS Take 1,000 Units by mouth daily.     Marland Kitchen levETIRAcetam (KEPPRA) 750 MG tablet TAKE 1 TABLET (750 MG TOTAL) BY MOUTH 2 (TWO) TIMES DAILY. 60 tablet 5  . memantine (NAMENDA) 10 MG tablet TAKE 1 TABLET (10 MG TOTAL) BY MOUTH 2 (TWO) TIMES DAILY. 60 tablet 1  . metoprolol tartrate (LOPRESSOR) 25 MG tablet Take 25 mg by mouth daily.    . phenytoin (DILANTIN) 100 MG ER capsule Take 1 capsule (100 mg total) by mouth 2 (two) times daily. 60 capsule 5  . QUEtiapine (SEROQUEL) 25 MG tablet Take 25 mg by mouth daily.    . simvastatin (ZOCOR) 20 MG tablet Take 20 mg by mouth every evening.    Marland Kitchen ibuprofen (ADVIL,MOTRIN) 200 MG tablet Take 400 mg by mouth 2 (two) times daily as needed for moderate pain.    . memantine (NAMENDA) 10 MG tablet TAKE 1 TABLET (10 MG TOTAL) BY MOUTH 2 (TWO) TIMES DAILY. 60 tablet 1   No current facility-administered medications for this visit.    Hypertension ROS: taking medications as instructed, no medication side effects noted, no TIA's, no chest pain on exertion, no dyspnea on exertion and no swelling of ankles.  New concerns: He would like a order for nicotine patches to help him quit smoking.  Objective:  BP 115/74 mmHg  Pulse 64  Temp(Src) 98 F (36.7 C)  Resp 15  Ht 5\' 9"  (1.753 m)  Wt 169 lb (76.658 kg)  BMI 24.95 kg/m2  SpO2 100%  Appearance alert, well appearing, and in no distress, oriented to person, place, and time and normal appearing weight. General exam BP noted to be well controlled today in  office, S1, S2 normal, no gallop, no murmur, chest clear, no JVD, no HSM, no edema.  Lab review: labs are reviewed, up to date and normal.   Assessment:   Hypertension well controlled, needs to quit smoking and needs to follow diet more regularly.  Dyslipidemia: controlled. Last LDL was 02/2015 and was at goal at 59. Tobacco abuse: I have praised patient for wanting to take the next step by wanting to quit smoking. We have discussed a quit plan and ways to stay motivated. I have sent nicotine patches to his pharmacy.  Plan:  Current treatment plan is effective, no change in therapy. Reviewed diet, exercise and weight control. Recommended sodium restriction. Very strongly urged to quit smoking to reduce cardiovascular risk.   Return in about 3 months (around 09/20/2015) for Hypertension.  Lance Bosch, NP  06/27/2015 9:47 PM

## 2015-06-20 NOTE — Progress Notes (Signed)
Patient here for follow up on his HTN

## 2015-06-21 ENCOUNTER — Telehealth (HOSPITAL_COMMUNITY): Payer: Self-pay

## 2015-06-22 ENCOUNTER — Ambulatory Visit (HOSPITAL_COMMUNITY)
Admission: RE | Admit: 2015-06-22 | Discharge: 2015-06-22 | Disposition: A | Discharge status: Home / Self Care | Payer: Medicare Other | Source: Ambulatory Visit | Attending: Internal Medicine | Admitting: Internal Medicine

## 2015-06-22 DIAGNOSIS — B182 Chronic viral hepatitis C: Secondary | ICD-10-CM | POA: Insufficient documentation

## 2015-06-22 DIAGNOSIS — B1920 Unspecified viral hepatitis C without hepatic coma: Secondary | ICD-10-CM | POA: Diagnosis not present

## 2015-06-30 ENCOUNTER — Other Ambulatory Visit: Payer: Self-pay

## 2015-06-30 MED ORDER — PHENYTOIN SODIUM EXTENDED 100 MG PO CAPS: 100.0000 mg | ORAL_CAPSULE | Freq: Two times a day (BID) | ORAL | Status: DC

## 2015-07-14 ENCOUNTER — Ambulatory Visit: Payer: Medicare Other | Admitting: Adult Health

## 2015-07-20 ENCOUNTER — Encounter: Payer: Self-pay | Admitting: Adult Health

## 2015-07-20 ENCOUNTER — Ambulatory Visit (INDEPENDENT_AMBULATORY_CARE_PROVIDER_SITE_OTHER): Payer: Medicare Other | Admitting: Adult Health

## 2015-07-20 VITALS — BP 141/77 | HR 65 | Ht 69.0 in | Wt 154.0 lb

## 2015-07-20 DIAGNOSIS — Z5181 Encounter for therapeutic drug level monitoring: Secondary | ICD-10-CM

## 2015-07-20 DIAGNOSIS — R569 Unspecified convulsions: Secondary | ICD-10-CM

## 2015-07-20 DIAGNOSIS — Z79899 Other long term (current) drug therapy: Secondary | ICD-10-CM | POA: Diagnosis not present

## 2015-07-20 DIAGNOSIS — R413 Other amnesia: Secondary | ICD-10-CM | POA: Diagnosis not present

## 2015-07-20 NOTE — Progress Notes (Signed)
I have read the note, and I agree with the clinical assessment and plan.  [name],[name]   

## 2015-07-20 NOTE — Patient Instructions (Signed)
Continue Dilantin for seizures Continue Namenda for memory I will check blood work today. If your symptoms worsen or you develop new symptoms please let us know.

## 2015-07-20 NOTE — Progress Notes (Signed)
PATIENT: [name] DOB: 04-22-1951  REASON FOR VISIT: follow up- seizures, memory disorder HISTORY FROM: patient  HISTORY OF PRESENT ILLNESS:  [name] is a 64 year old male with a history of seizures and memory disorder. He returns today for follow-up. The patient continues to take Dilantin and Keppra. He denies any seizure events since last visit. He lives with his sister [name]. He is able to complete all ADLs independently. Patient feels that his memory has remained the same. Sister agrees with this. She states that he has not been wandering out of the house anymore. Denies any new medical issues. He returns today for an evaluation.  HISTORY 01/07/15: [name] is a 64 year old male with a history of seizures and memory disorder. He returns today for follow-up. The patient is currently taken Dilantin and Keppra and is tolerating it well. The patient's sister is with him today. She is the caregiver. She states that he has not had any additional seizures. The patient is able to complete all ADLs independently but sister provides some help. He does not operate a motor vehicle. He continues to use a cane when ambulating. Denies any falls. The patient's sister states that his memory has gotten worse. She states that his long-term memory is intact but his short-term memory is not. She states that he can't remember where he put things. She states that during the night he will try to wander out of the house. His sister stays with him during the day and night. He is also on Seroquel  REVIEW OF SYSTEMS: Out of a complete 14 system review of symptoms, the patient complains only of the following symptoms, and all other reviewed systems are negative.  Frequent waking  ALLERGIES: No Known Allergies  HOME MEDICATIONS: Outpatient Prescriptions Prior to Visit  Medication Sig Dispense Refill  . aspirin EC 325 MG EC tablet Take 1 tablet (325 mg total) by mouth 2 (two) times daily after a meal.  30 tablet 0  . Cholecalciferol (VITAMIN D-3) 1000 UNITS CAPS Take 1,000 Units by mouth daily.     Marland Kitchen ibuprofen (ADVIL,MOTRIN) 200 MG tablet Take 400 mg by mouth 2 (two) times daily as needed for moderate pain.    Marland Kitchen levETIRAcetam (KEPPRA) 750 MG tablet TAKE 1 TABLET (750 MG TOTAL) BY MOUTH 2 (TWO) TIMES DAILY. 60 tablet 5  . memantine (NAMENDA) 10 MG tablet TAKE 1 TABLET (10 MG TOTAL) BY MOUTH 2 (TWO) TIMES DAILY. 60 tablet 1  . memantine (NAMENDA) 10 MG tablet TAKE 1 TABLET (10 MG TOTAL) BY MOUTH 2 (TWO) TIMES DAILY. 60 tablet 1  . metoprolol succinate (TOPROL-XL) 25 MG 24 hr tablet Take 1 tablet (25 mg total) by mouth daily. 90 tablet 3  . phenytoin (DILANTIN) 100 MG ER capsule Take 1 capsule (100 mg total) by mouth 2 (two) times daily. 60 capsule 3  . simvastatin (ZOCOR) 20 MG tablet Take 1 tablet (20 mg total) by mouth every evening. 30 tablet 5  . nicotine (NICODERM CQ - DOSED IN MG/24 HOURS) 21 mg/24hr patch Place 1 patch (21 mg total) onto the skin daily. (Patient not taking: Reported on 07/20/2015) 28 patch 0   No facility-administered medications prior to visit.    PAST MEDICAL HISTORY: Past Medical History  Diagnosis Date  . History of alcoholism   . Hx of ischemic vertebrobasilar artery thalamic stroke     Right  . Gait disorder   . Dementia   . Gingival hypertrophy  Secondary to Dilantin  . Seizures   . Memory disorder 03/04/2013  . Gait disorder   . Stroke     Right frontal, right thalamic  . Ulnar neuropathy of left upper extremity   . Alcoholism     History of, not active    PAST SURGICAL HISTORY: Past Surgical History  Procedure Laterality Date  . Wrist surgery Left 95 & 96  . Hip arthroplasty Left 11/26/2013    Procedure: LEFT HIP HEMIARTHROPLASTY;  Surgeon: Mcarthur Rossetti, MD;  Location: [facility];  Service: Orthopedics;  Laterality: Left;    FAMILY HISTORY: Family History  Problem Relation Age of Onset  . Pulmonary embolism Mother   . Liver disease  Father   . Diabetes Sister   . Hypertension Sister   . Seizures Neg Hx   . Hypertension Sister   . Hypertension Sister     SOCIAL HISTORY: Social History   Social History  . Marital Status: Divorced    Spouse Name: N/A  . Number of Children: 0  . Years of Education: 10   Occupational History  . Not on file.   Social History Main Topics  . Smoking status: Former Smoker -- 3.00 packs/day    Quit date: 01/02/2014  . Smokeless tobacco: Former Systems developer  . Alcohol Use: No     Comment: Former Alcoholic  . Drug Use: No  . Sexual Activity: Yes   Other Topics Concern  . Not on file   Social History Narrative   Patient is single and lives with a friend and her son.   Patient does not have any children.   Patient is disabled.   Patient has a 10 th grade education.   Patient is left-handed.   Patient drinks some soda but not everyday.      PHYSICAL EXAM  Filed Vitals:   07/20/15 1456  BP: 141/77  Pulse: 65  Height: 5\' 9"  (1.753 m)  Weight: 154 lb (69.854 kg)   Body mass index is 22.73 kg/(m^2).   MMSE - Mini Mental State Exam 07/20/2015 01/07/2015  Orientation to time 2 3  Orientation to Place 5 2  Registration 3 3  Attention/ Calculation 0 0  Recall 0 1  Language- name 2 objects 2 2  Language- repeat 0 1  Language- follow 3 step command 3 0  Language- read & follow direction 0 1  Write a sentence 0 0  Copy design 0 0  Total score 15 13    Generalized: Well developed, in no acute distress   Neurological examination  Mentation: Alert. Follows all commands speech and language fluent Cranial nerve II-XII: Pupils were equal round reactive to light. Extraocular movements were full, visual field were full on confrontational test. Facial sensation and strength were normal. Uvula tongue midline. Head turning and shoulder shrug  were normal and symmetric. Motor: The motor testing reveals 5 over 5 strength of all 4 extremities except 4/5 strength in the left upper  extremity. Good symmetric motor tone is noted throughout.  Sensory: Sensory testing is intact to soft touch on all 4 extremities. No evidence of extinction is noted.  Coordination: Cerebellar testing reveals good finger-nose-finger on the right but some dysmetria on the left. Good  heel-to-shin bilaterally.  Gait and station: Patient uses a cane when ambulate.  Reflexes: Deep tendon reflexes are symmetric and normal bilaterally.   DIAGNOSTIC DATA (LABS, IMAGING, TESTING) - I reviewed patient records, labs, notes, testing and imaging myself where available.  Lab Results  Component Value Date   WBC 5.0 01/12/2015   HGB 14.2 01/12/2015   HCT 41.9 01/12/2015   MCV 95 01/12/2015   PLT 189 01/12/2015      Component Value Date/Time   NA 143 03/23/2015 1618   NA 149* 02/21/2015 0910   K 4.3 03/23/2015 1618   CL 103 03/23/2015 1618   CO2 26 03/23/2015 1618   GLUCOSE 96 03/23/2015 1618   GLUCOSE 80 02/21/2015 0910   BUN 11 03/23/2015 1618   BUN 12 02/21/2015 0910   CREATININE 0.90 03/23/2015 1618   CREATININE 0.98 02/21/2015 0910   CALCIUM 9.0 03/23/2015 1618   CALCIUM 9.0 03/23/2015 1618   PROT 7.0 03/23/2015 1618   PROT 7.1 02/21/2015 0910   ALBUMIN 4.2 03/23/2015 1618   AST 21 03/23/2015 1618   ALT 19 03/23/2015 1618   ALKPHOS 150* 03/23/2015 1618   BILITOT 0.4 03/23/2015 1618   BILITOT 0.3 02/21/2015 0910   GFRNONAA >89 03/23/2015 1618   GFRNONAA 82 02/21/2015 0910   GFRAA >89 03/23/2015 1618   GFRAA 95 02/21/2015 0910   Lab Results  Component Value Date   CHOL 125 03/02/2015   HDL 45 03/02/2015   LDLCALC 59 03/02/2015   TRIG 106 03/02/2015   CHOLHDL 2.8 03/02/2015   No results found for: HGBA1C No results found for: [id number] Lab Results  Component Value Date   TSH 2.292 03/02/2015      ASSESSMENT AND PLAN 64 y.o. year old male  has a past medical history of History of alcoholism; ischemic vertebrobasilar artery thalamic stroke; Gait disorder; Dementia;  Gingival hypertrophy; Seizures; Memory disorder (03/04/2013); Gait disorder; Stroke; Ulnar neuropathy of left upper extremity; and Alcoholism. here with:  1. Seizures 2. Memory disorder  Overall the patient is doing well. He has not had any additional seizures. I will check a Dilantin level today. He has not taken his medication this morning to this will be a trough level. He will continue on Dilantin and Keppra. The patient's memory has remained stable. We will continue to monitor. He will remain on Namenda. Patient and sister advised that if his symptoms worsen or he develops any new symptoms he should let us know. He will follow-up in 6 months or sooner if needed.  Ward Givens, MSN, NP-C 07/20/2015, 3:07 PM [facility] [address] [place], [place] [address] [phone]

## 2015-07-21 ENCOUNTER — Telehealth: Payer: Self-pay | Admitting: Adult Health

## 2015-07-21 LAB — PHENYTOIN LEVEL, TOTAL: PHENYTOIN (DILANTIN), SERUM: 21.5 ug/mL — AB (ref 10.0–20.0)

## 2015-07-21 MED ORDER — PHENYTOIN SODIUM EXTENDED 100 MG PO CAPS: 100.0000 mg | ORAL_CAPSULE | Freq: Every day | ORAL | Status: DC

## 2015-07-21 MED ORDER — PHENYTOIN 50 MG PO CHEW: 50.0000 mg | CHEWABLE_TABLET | Freq: Every day | ORAL | Status: DC

## 2015-07-21 NOTE — Telephone Encounter (Signed)
I called the patient's sister. His Dilantin level is slightly elevated again- this was a trough level. I will decrease his Dilantin dosage. He will begin taking 50 mg in the morning and 100 mg in the evening. Patient's sister verbalized understanding. The patient has not had a seizure in almost a year. He is also on Keppra and will continue this. We will recheck a Dilantin level in 3 weeks.

## 2015-07-26 ENCOUNTER — Telehealth: Payer: Self-pay | Admitting: *Deleted

## 2015-07-26 NOTE — Telephone Encounter (Signed)
Patient had Elastography 8/31. [name] is concerned that his follow up appointment with Dr. Linus Salmons is not until 12/1 (first available at this time).  Please advise how to relay results of the elastography or if patient can be worked in elsewhere. Landis Gandy, RN

## 2015-07-27 NOTE — Telephone Encounter (Signed)
He should be approved soon so will be seeing him earlier once Harvoni approved and we can work him in.  thanks

## 2015-07-28 NOTE — Telephone Encounter (Signed)
Relayed information to patient's wife. She verbalized understanding, stating they really just wanted to know if he was in any immediate danger (no symptoms or status change since appointment).  RN reassured her that he is not.

## 2015-07-29 ENCOUNTER — Other Ambulatory Visit: Payer: Self-pay | Admitting: Neurology

## 2015-08-04 ENCOUNTER — Other Ambulatory Visit: Payer: Self-pay | Admitting: Neurology

## 2015-08-16 ENCOUNTER — Telehealth: Payer: Self-pay | Admitting: Adult Health

## 2015-08-16 DIAGNOSIS — Z5181 Encounter for therapeutic drug level monitoring: Secondary | ICD-10-CM

## 2015-08-16 DIAGNOSIS — Z79899 Other long term (current) drug therapy: Principal | ICD-10-CM

## 2015-08-16 NOTE — Telephone Encounter (Signed)
Spoke to his caretaker/sister [name]. Gave instructions. She said she would bring patient in later this week.

## 2015-08-16 NOTE — Telephone Encounter (Signed)
error 

## 2015-08-16 NOTE — Telephone Encounter (Signed)
Patient's Dilantin level needs to be rechecked. Please have the patient come in for this. Remind him that he should not take his medication until after he has his blood work. Best to come in first thing in the morning so his medication is not delayed.

## 2015-08-16 NOTE — Telephone Encounter (Signed)
-----   Message from Ward Givens, NP sent at 07/21/2015 10:58 AM EDT ----- Regarding: dilantin dilantin

## 2015-08-19 ENCOUNTER — Other Ambulatory Visit (INDEPENDENT_AMBULATORY_CARE_PROVIDER_SITE_OTHER): Payer: Self-pay

## 2015-08-19 DIAGNOSIS — Z79899 Other long term (current) drug therapy: Principal | ICD-10-CM

## 2015-08-19 DIAGNOSIS — Z5181 Encounter for therapeutic drug level monitoring: Secondary | ICD-10-CM

## 2015-08-19 DIAGNOSIS — Z0289 Encounter for other administrative examinations: Secondary | ICD-10-CM

## 2015-08-20 LAB — PHENYTOIN LEVEL, TOTAL: Phenytoin (Dilantin), Serum: 10 ug/mL (ref 10.0–20.0)

## 2015-08-22 ENCOUNTER — Telehealth: Payer: Self-pay | Admitting: Adult Health

## 2015-08-22 NOTE — Telephone Encounter (Signed)
Pt's sister called sts she has received phone calls from [facility] today but her phone is not working. Can speak with son at  [phone]

## 2015-08-22 NOTE — Telephone Encounter (Signed)
LMVM for sister, patricia quick about brothers lab results.

## 2015-08-23 ENCOUNTER — Encounter: Payer: Self-pay | Admitting: *Deleted

## 2015-08-24 NOTE — Telephone Encounter (Addendum)
Colan Laymon calling about lab results on pt.   I told her that he will get copy of results with result note in the mail.  There is no need to call me back unless he starts having seizures or other concerns.  She verbalized understanding.   I told her that dilantin level was 10 (normal range).  He has not had anymore seizures.  I would relay to MM/NP.

## 2015-08-24 NOTE — Telephone Encounter (Signed)
Mailed letter to pt with results and requesting call back about any additional seizures if having any?

## 2015-09-22 ENCOUNTER — Encounter: Payer: Self-pay | Admitting: Internal Medicine

## 2015-09-22 ENCOUNTER — Ambulatory Visit (INDEPENDENT_AMBULATORY_CARE_PROVIDER_SITE_OTHER): Payer: Medicare Other | Admitting: Internal Medicine

## 2015-09-22 VITALS — BP 129/80 | HR 71 | Temp 97.6°F | Wt 173.0 lb

## 2015-09-22 DIAGNOSIS — R569 Unspecified convulsions: Secondary | ICD-10-CM | POA: Diagnosis not present

## 2015-09-22 DIAGNOSIS — K7400 Hepatic fibrosis, unspecified: Secondary | ICD-10-CM

## 2015-09-22 DIAGNOSIS — Z23 Encounter for immunization: Secondary | ICD-10-CM

## 2015-09-22 DIAGNOSIS — B182 Chronic viral hepatitis C: Secondary | ICD-10-CM

## 2015-09-22 NOTE — Progress Notes (Signed)
   Subjective:    Patient ID: REGIONAL HOVAN, male    DOB: 07/04/1951, 64 y.o.   MRN: [id number]  HPI Here for follow up of HCV.    Has genotype 1a, viral load 3.4 million, elastography noted and is c/w F3/4.  Hepatitis B immune and A non-immune.  Also with a seizure disorder on Keppra and Dilantin. I reviewed the record from neurology and he has been difficult to control but now doing well on the two medications.  Does not drink alcohol.     Review of Systems  Constitutional: Negative for fatigue.  Gastrointestinal: Negative for diarrhea.  Skin: Negative for rash.  Neurological: Negative for dizziness and light-headedness.       Objective:   Physical Exam  Constitutional: He appears well-developed and well-nourished. No distress.  Eyes: No scleral icterus.  Cardiovascular: Normal rate, regular rhythm and normal heart sounds.   No murmur heard. Pulmonary/Chest: Effort normal and breath sounds normal. No respiratory distress. He has no wheezes.  Skin: No rash noted.  Vitals reviewed.   Social History   Social History  . Marital Status: Divorced    Spouse Name: N/A  . Number of Children: 0  . Years of Education: 10   Occupational History  . Not on file.   Social History Main Topics  . Smoking status: Former Smoker -- 3.00 packs/day    Quit date: 01/02/2014  . Smokeless tobacco: Former Systems developer  . Alcohol Use: No     Comment: Former Alcoholic  . Drug Use: No  . Sexual Activity: Yes   Other Topics Concern  . Not on file   Social History Narrative   Patient is single and lives with a friend and her son.   Patient does not have any children.   Patient is disabled.   Patient has a 10 th grade education.   Patient is left-handed.   Patient drinks some soda but not everyday.        Assessment & Plan:

## 2015-09-23 DIAGNOSIS — K7400 Hepatic fibrosis, unspecified: Secondary | ICD-10-CM | POA: Insufficient documentation

## 2015-09-23 NOTE — Assessment & Plan Note (Signed)
I discussed results with him today.  Significant fibrosis. He will need [place] screening every 6 months.  Will need EGD and will refer to GI.

## 2015-09-23 NOTE — Assessment & Plan Note (Signed)
Will need to either switch him off of phenytoin to something else or defer treatment.  I will discuss this with his primary neurologist.

## 2015-09-23 NOTE — Assessment & Plan Note (Signed)
Unfortunately, on phenytoin, all treatment options are contraindicated, including ones coming in the future.

## 2015-09-24 ENCOUNTER — Telehealth: Payer: Self-pay | Admitting: Neurology

## 2015-09-24 NOTE — Telephone Encounter (Signed)
Seen by Dr. Linus Salmons, patient has Hep. C, needs to come off of Dilantin. Will need RV.

## 2015-09-26 NOTE — Telephone Encounter (Signed)
Appointment scheduled 12/7. 

## 2015-09-27 NOTE — Telephone Encounter (Signed)
Sister Dcarlos Ribeiro returned phone call, please call [phone].

## 2015-09-27 NOTE — Telephone Encounter (Signed)
I can see the patient.

## 2015-09-27 NOTE — Telephone Encounter (Signed)
Pt. is sched. with Megan 09-28-15/fim

## 2015-09-28 ENCOUNTER — Ambulatory Visit: Payer: Self-pay | Admitting: Neurology

## 2015-09-28 ENCOUNTER — Ambulatory Visit (INDEPENDENT_AMBULATORY_CARE_PROVIDER_SITE_OTHER): Payer: Medicare Other | Admitting: Adult Health

## 2015-09-28 ENCOUNTER — Encounter: Payer: Self-pay | Admitting: Adult Health

## 2015-09-28 VITALS — BP 119/72 | HR 72 | Ht 69.0 in | Wt 173.0 lb

## 2015-09-28 DIAGNOSIS — B1920 Unspecified viral hepatitis C without hepatic coma: Secondary | ICD-10-CM

## 2015-09-28 DIAGNOSIS — R569 Unspecified convulsions: Secondary | ICD-10-CM | POA: Diagnosis not present

## 2015-09-28 DIAGNOSIS — R413 Other amnesia: Secondary | ICD-10-CM

## 2015-09-28 MED ORDER — LEVETIRACETAM 1000 MG PO TABS: 1000.0000 mg | ORAL_TABLET | Freq: Two times a day (BID) | ORAL | Status: DC

## 2015-09-28 NOTE — Progress Notes (Signed)
PATIENT: [name] DOB: 1951/02/08  REASON FOR VISIT: follow up- seizures, memory disorder HISTORY FROM: patient  HISTORY OF PRESENT ILLNESS: [name] is a 64 year old male with a history of seizures and memory disorder. He returns today to have his medication adjusted. The patient was diagnosed with hepatitis C. He is unable to start treatment for hepatitis C as most treatments are contraindicated if on phenytoin. The patient is also on Keppra 750 mg twice a day. He states right tolerating this medication well. He denies any seizure events. He continues to live at home with his sister. She helps him with all of his medications. He is able to complete all ADLs independent. He returns today for medication adjustment.  HISTORY 07/20/15: [name] is a 64 year old male with a history of seizures and memory disorder. He returns today for follow-up. The patient continues to take Dilantin and Keppra. He denies any seizure events since last visit. He lives with his sister Larod Bargman. He is able to complete all ADLs independently. Patient feels that his memory has remained the same. Sister agrees with this. She states that he has not been wandering out of the house anymore. Denies any new medical issues. He returns today for an evaluation.  HISTORY 01/07/15: [name] is a 64 year old male with a history of seizures and memory disorder. He returns today for follow-up. The patient is currently taken Dilantin and Keppra and is tolerating it well. The patient's sister is with him today. She is the caregiver. She states that he has not had any additional seizures. The patient is able to complete all ADLs independently but sister provides some help. He does not operate a motor vehicle. He continues to use a cane when ambulating. Denies any falls. The patient's sister states that his memory has gotten worse. She states that his long-term memory is intact but his short-term memory is not. She states that he  can't remember where he put things. She states that during the night he will try to wander out of the house. His sister stays with him during the day and night. He is also on Seroquel  REVIEW OF SYSTEMS: Out of a complete 14 system review of symptoms, the patient complains only of the following symptoms, and all other reviewed systems are negative.  Runny nose, restless leg  ALLERGIES: No Known Allergies  HOME MEDICATIONS: Outpatient Prescriptions Prior to Visit  Medication Sig Dispense Refill  . aspirin EC 325 MG EC tablet Take 1 tablet (325 mg total) by mouth 2 (two) times daily after a meal. 30 tablet 0  . Cholecalciferol (VITAMIN D-3) 1000 UNITS CAPS Take 1,000 Units by mouth daily.     Marland Kitchen ibuprofen (ADVIL,MOTRIN) 200 MG tablet Take 400 mg by mouth 2 (two) times daily as needed for moderate pain.    . memantine (NAMENDA) 10 MG tablet TAKE 1 TABLET (10 MG TOTAL) BY MOUTH 2 (TWO) TIMES DAILY. 60 tablet 1  . metoprolol succinate (TOPROL-XL) 25 MG 24 hr tablet Take 1 tablet (25 mg total) by mouth daily. 90 tablet 3  . phenytoin (DILANTIN) 100 MG ER capsule Take 1 capsule (100 mg total) by mouth at bedtime. 30 capsule 3  . phenytoin (DILANTIN) 50 MG tablet Chew 1 tablet (50 mg total) by mouth daily. 30 tablet 3  . simvastatin (ZOCOR) 20 MG tablet Take 1 tablet (20 mg total) by mouth every evening. 30 tablet 5  . levETIRAcetam (KEPPRA) 750 MG tablet TAKE 1 TABLET (750  MG TOTAL) BY MOUTH 2 (TWO) TIMES DAILY. 60 tablet 5   No facility-administered medications prior to visit.    PAST MEDICAL HISTORY: Past Medical History  Diagnosis Date  . History of alcoholism ([place])   . Hx of ischemic vertebrobasilar artery thalamic stroke     Right  . Gait disorder   . Dementia   . 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 Seizures ([place])   . Memory disorder 03/04/2013  . Gait disorder   . Stroke [facility])     Right frontal, right thalamic  . Ulnar neuropathy of left upper extremity   .  Alcoholism ([place])     History of, not active    PAST SURGICAL HISTORY: Past Surgical History  Procedure Laterality Date  . Wrist surgery Left 95 & 96  . Hip arthroplasty Left 11/26/2013    Procedure: LEFT HIP HEMIARTHROPLASTY;  Surgeon: Mcarthur Rossetti, MD;  Location: [place];  Service: Orthopedics;  Laterality: Left;    FAMILY HISTORY: Family History  Problem Relation Age of Onset  . Pulmonary embolism Mother   . Liver disease Father   . Diabetes Sister   . Hypertension Sister   . Seizures Neg Hx   . Hypertension Sister   . Hypertension Sister     SOCIAL HISTORY: Social History   Social History  . Marital Status: Divorced    Spouse Name: N/A  . Number of Children: 0  . Years of Education: 10   Occupational History  . Not on file.   Social History Main Topics  . Smoking status: Former Smoker -- 3.00 packs/day    Quit date: 01/02/2014  . Smokeless tobacco: Former Systems developer  . Alcohol Use: No     Comment: Former Alcoholic  . Drug Use: No  . Sexual Activity: Yes   Other Topics Concern  . Not on file   Social History Narrative   Patient is single and lives with a friend and her son.   Patient does not have any children.   Patient is disabled.   Patient has a 10 th grade education.   Patient is left-handed.   Patient drinks some soda but not everyday.      PHYSICAL EXAM  Filed Vitals:   09/28/15 1431  BP: 119/72  Pulse: 72  Height: 5\' 9"  (1.753 m)  Weight: 173 lb (78.472 kg)   Body mass index is 25.54 kg/(m^2).  Generalized: Well developed, in no acute distress   Neurological examination  Mentation: Alert oriented to time, place, history taking. Follows all commands speech and language fluent Cranial nerve II-XII: Pupils were equal round reactive to light. Extraocular movements were full, visual field were full on confrontational test. Facial sensation and strength were normal. Uvula tongue midline. Head turning and shoulder shrug  were normal and  symmetric. Motor: The motor testing reveals 5 over 5 strength in the upper extremities and right lower extremity and 4 over 5 strength in the left lower extremity. Good symmetric motor tone is noted throughout.  Sensory: Sensory testing is intact to soft touch on all 4 extremities. No evidence of extinction is noted.  Coordination: Cerebellar testing reveals good finger-nose-finger and heel-to-shin bilaterally.  Gait and station: Patient uses a cane when ambulating. Gait is slightly unsteady. Tandem gait not attempted.  Reflexes: Deep tendon reflexes are symmetric and normal bilaterally.   DIAGNOSTIC DATA (LABS, IMAGING, TESTING) - I reviewed patient records, labs, notes, testing and imaging myself where available.  Lab  Results  Component Value Date   WBC 5.0 01/12/2015   HGB 14.2 01/12/2015   HCT 41.9 01/12/2015   MCV 95 01/12/2015   PLT 189 01/12/2015      Component Value Date/Time   NA 143 03/23/2015 1618   NA 149* 02/21/2015 0910   K 4.3 03/23/2015 1618   CL 103 03/23/2015 1618   CO2 26 03/23/2015 1618   GLUCOSE 96 03/23/2015 1618   GLUCOSE 80 02/21/2015 0910   BUN 11 03/23/2015 1618   BUN 12 02/21/2015 0910   CREATININE 0.90 03/23/2015 1618   CREATININE 0.98 02/21/2015 0910   CALCIUM 9.0 03/23/2015 1618   CALCIUM 9.0 03/23/2015 1618   PROT 7.0 03/23/2015 1618   PROT 7.1 02/21/2015 0910   ALBUMIN 4.2 03/23/2015 1618   ALBUMIN 4.4 02/21/2015 0910   AST 21 03/23/2015 1618   ALT 19 03/23/2015 1618   ALKPHOS 150* 03/23/2015 1618   BILITOT 0.4 03/23/2015 1618   BILITOT 0.3 02/21/2015 0910   GFRNONAA >89 03/23/2015 1618   GFRNONAA 82 02/21/2015 0910   GFRAA >89 03/23/2015 1618   GFRAA 95 02/21/2015 0910   Lab Results  Component Value Date   CHOL 125 03/02/2015   HDL 45 03/02/2015   LDLCALC 59 03/02/2015   TRIG 106 03/02/2015   CHOLHDL 2.8 03/02/2015    Lab Results  Component Value Date   TSH 2.292 03/02/2015      ASSESSMENT AND PLAN 64 y.o. year old male   has a past medical history of History of alcoholism ([place]); ischemic vertebrobasilar artery thalamic stroke; Gait disorder; Dementia; Gingival hypertrophy; Seizures ([place]); Memory disorder (03/04/2013); Gait disorder; Stroke [facility]); Ulnar neuropathy of left upper extremity; and Alcoholism ([place]). here with:  1. Seizure 2. Hepatitis C 3. Memory disorder  The patient's seizures are currently under good control. We will need to wean the patient off of Dilantin in order for him to participate in treatment for hepatitis C. For now we will increase the patient's Keppra to 1000 mg twice a day. If the patient is able to tolerate this increase we will slowly start weaning off the Dilantin. Patient and his sister is amenable to this. He will follow-up in one month or sooner if needed.   Ward Givens, MSN, NP-C 09/28/2015, 3:28 PM [facility] [address], [place], [place] [address] [phone]

## 2015-09-28 NOTE — Progress Notes (Signed)
I have read the note, and I agree with the clinical assessment and plan.  [name],[name]   

## 2015-09-28 NOTE — Patient Instructions (Signed)
Increase Keppra to 1000 mg twice a day Continue current dose of dilantin If tolerating Keppra we will consider weaning down dilantin at the next visit.

## 2015-09-29 ENCOUNTER — Other Ambulatory Visit: Payer: Self-pay | Admitting: Adult Health

## 2015-10-11 ENCOUNTER — Telehealth: Payer: Self-pay | Admitting: Adult Health

## 2015-10-11 NOTE — Telephone Encounter (Signed)
I called the patient and left a voicemail.

## 2015-10-31 ENCOUNTER — Ambulatory Visit: Payer: Medicare Other | Admitting: Adult Health

## 2015-11-02 ENCOUNTER — Telehealth: Payer: Self-pay | Admitting: Adult Health

## 2015-11-02 DIAGNOSIS — Z5181 Encounter for therapeutic drug level monitoring: Secondary | ICD-10-CM

## 2015-11-02 NOTE — Telephone Encounter (Signed)
Spoke to pt's sister. She is willing to bring pt in before 8:30 on Friday 11/04/15 before pt takes his dilantin to have his dilantin level drawn. She will then bring pt back at 2:00 for his appt with Jinny Blossom, NP. Pt's sister verbalized understanding of all the above information. Jinny Blossom, NP aware. Dilantin level ordered.

## 2015-11-02 NOTE — Telephone Encounter (Signed)
Pt's sister called back said she could not get message off VM. I relayed message to her. She said she would bring him in the morning for dilantin labs with out taking dilantin and back again on 1/13/ for his appt at 2pm.

## 2015-11-02 NOTE — Telephone Encounter (Signed)
Pt's appt is at 2:00 on 1/13. He needs to come in early one day before his appt (tomorrow) and have his blood drawn before he takes his dilantin. Waiting until 2:00 to take his dilantin at the office visit with a blood draw is too long to wait. Pt may want to reschedule his appt to first thing in the morning so he does not have to make 2 trips.  I called pt's sister back to discuss. No answer, left a message asking her to call me back.

## 2015-11-02 NOTE — Telephone Encounter (Signed)
Pt's sister called inquiring if pt will need labs on 11/04/15 and if so will he take his medications.

## 2015-11-04 ENCOUNTER — Other Ambulatory Visit (INDEPENDENT_AMBULATORY_CARE_PROVIDER_SITE_OTHER): Payer: Self-pay

## 2015-11-04 ENCOUNTER — Ambulatory Visit (INDEPENDENT_AMBULATORY_CARE_PROVIDER_SITE_OTHER): Payer: Medicare Other | Admitting: Adult Health

## 2015-11-04 ENCOUNTER — Encounter: Payer: Self-pay | Admitting: Adult Health

## 2015-11-04 VITALS — BP 98/62 | HR 76 | Ht 69.0 in | Wt 175.0 lb

## 2015-11-04 DIAGNOSIS — R569 Unspecified convulsions: Secondary | ICD-10-CM

## 2015-11-04 DIAGNOSIS — Z5181 Encounter for therapeutic drug level monitoring: Secondary | ICD-10-CM

## 2015-11-04 DIAGNOSIS — Z0289 Encounter for other administrative examinations: Secondary | ICD-10-CM

## 2015-11-04 NOTE — Progress Notes (Signed)
I have read the note, and I agree with the clinical assessment and plan.  WILLIS,CHARLES KEITH   

## 2015-11-04 NOTE — Patient Instructions (Signed)
Continue Keppra Decrease Dilantin: continue take 100 mg at bedtime. Take 25 mg in the morning (1/2 tablet) for 1 week then eliminate the morning dose.  If he has any seizures let me know

## 2015-11-04 NOTE — Progress Notes (Signed)
PATIENT: [name] DOB: [birth year]  REASON FOR VISIT: follow up- seizures HISTORY FROM: patient  HISTORY OF PRESENT ILLNESS:  [name] is a 65 year old male with a history of seizures. He returns today  To have his medications adjusted. The patient is currently weaning off of Dilantin in order to start treatment for hepatitis C. At the last visit his Keppra was increased. He states that he has not had any seizure events. He is tolerating the increase in Keppra well. He continues to live at home with his sister. She manages most of his medical affairs. He returns today for follow-up.  HISTORY [name] is a 65 year old male with a history of seizures and memory disorder. He returns today to have his medication adjusted. The patient was diagnosed with hepatitis C. He is unable to start treatment for hepatitis C as most treatments are contraindicated if on phenytoin. The patient is also on Keppra 750 mg twice a day. He states right tolerating this medication well. He denies any seizure events. He continues to live at home with his sister. She helps him with all of his medications. He is able to complete all ADLs independent. He returns today for medication adjustment.  REVIEW OF SYSTEMS: Out of a complete 14 system review of symptoms, the patient complains only of the following symptoms, and all other reviewed systems are negative.   Runny nose   ALLERGIES: No Known Allergies  HOME MEDICATIONS: Outpatient Prescriptions Prior to Visit  Medication Sig Dispense Refill  . aspirin EC 325 MG EC tablet Take 1 tablet (325 mg total) by mouth 2 (two) times daily after a meal. 30 tablet 0  . Cholecalciferol (VITAMIN D-3) 1000 UNITS CAPS Take 1,000 Units by mouth daily.     Marland Kitchen ibuprofen (ADVIL,MOTRIN) 200 MG tablet Take 400 mg by mouth 2 (two) times daily as needed for moderate pain.    Marland Kitchen levETIRAcetam (KEPPRA) 1000 MG tablet Take 1 tablet (1,000 mg total) by mouth 2 (two) times daily. 60 tablet 3    . memantine (NAMENDA) 10 MG tablet TAKE 1 TABLET (10 MG TOTAL) BY MOUTH 2 (TWO) TIMES DAILY. 60 tablet 1  . metoprolol succinate (TOPROL-XL) 25 MG 24 hr tablet Take 1 tablet (25 mg total) by mouth daily. 90 tablet 3  . phenytoin (DILANTIN) 100 MG ER capsule Take 1 capsule (100 mg total) by mouth at bedtime. 30 capsule 3  . phenytoin (DILANTIN) 50 MG tablet Chew 1 tablet (50 mg total) by mouth daily. 30 tablet 3  . simvastatin (ZOCOR) 20 MG tablet Take 1 tablet (20 mg total) by mouth every evening. 30 tablet 5   No facility-administered medications prior to visit.    PAST MEDICAL HISTORY: Past Medical History  Diagnosis Date  . History of alcoholism ([place])   . Hx of ischemic vertebrobasilar artery thalamic stroke     Right  . Gait disorder   . Dementia   . 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 Seizures ([place])   . Memory disorder 03/04/2013  . Gait disorder   . Stroke [facility])     Right frontal, right thalamic  . Ulnar neuropathy of left upper extremity   . Alcoholism ([place])     History of, not active    PAST SURGICAL HISTORY: Past Surgical History  Procedure Laterality Date  . Wrist surgery Left 95 & 96  . Hip arthroplasty Left 11/26/2013    Procedure: LEFT HIP HEMIARTHROPLASTY;  Surgeon: Harrell Gave  Kerry Fort, MD;  Location: [place];  Service: Orthopedics;  Laterality: Left;    FAMILY HISTORY: Family History  Problem Relation Age of Onset  . Pulmonary embolism Mother   . Liver disease Father   . Diabetes Sister   . Hypertension Sister   . Seizures Neg Hx   . Hypertension Sister   . Hypertension Sister     SOCIAL HISTORY: Social History   Social History  . Marital Status: Divorced    Spouse Name: N/A  . Number of Children: 0  . Years of Education: 10   Occupational History  . Not on file.   Social History Main Topics  . Smoking status: Former Smoker -- 3.00 packs/day    Quit date: 01/02/2014  . Smokeless tobacco: Former Systems developer  . Alcohol Use: No      Comment: Former Alcoholic  . Drug Use: No  . Sexual Activity: Yes   Other Topics Concern  . Not on file   Social History Narrative   Patient is single and lives with a friend and her son.   Patient does not have any children.   Patient is disabled.   Patient has a 10 th grade education.   Patient is left-handed.   Patient drinks some soda but not everyday.      PHYSICAL EXAM  Filed Vitals:   11/04/15 1348  BP: 98/62  Pulse: 76  Height: 5\' 9"  (1.753 m)  Weight: 175 lb (79.379 kg)   Body mass index is 25.83 kg/(m^2).  Generalized: Well developed, in no acute distress   Neurological examination    Mentation: Alert  Follows all commands speech and language fluent Cranial nerve II-XII: Pupils were equal round reactive to light. Extraocular movements were full, visual field were full on confrontational test. Facial sensation and strength were normal. Uvula tongue midline. Head turning and shoulder shrug were normal and symmetric. Motor: The motor testing reveals 5 over 5 strength in the upper extremities and right lower extremity and 4 over 5 strength in the left lower extremity. Good symmetric motor tone is noted throughout.  Sensory: Sensory testing is intact to soft touch on all 4 extremities. No evidence of extinction is noted.  Coordination: Cerebellar testing reveals good finger-nose-finger and heel-to-shin bilaterally.  Gait and station: Patient uses a cane when ambulating. Gait is slightly unsteady. Tandem gait not attempted.  Reflexes: Deep tendon reflexes are symmetric and normal bilaterally.  DIAGNOSTIC DATA (LABS, IMAGING, TESTING) - I reviewed patient records, labs, notes, testing and imaging myself where available.  Lab Results  Component Value Date   WBC 5.0 01/12/2015   HGB 14.2 01/12/2015   HCT 41.9 01/12/2015   MCV 95 01/12/2015   PLT 189 01/12/2015      Component Value Date/Time   NA 143 03/23/2015 1618   NA 149* 02/21/2015 0910   K 4.3  03/23/2015 1618   CL 103 03/23/2015 1618   CO2 26 03/23/2015 1618   GLUCOSE 96 03/23/2015 1618   GLUCOSE 80 02/21/2015 0910   BUN 11 03/23/2015 1618   BUN 12 02/21/2015 0910   CREATININE 0.90 03/23/2015 1618   CREATININE 0.98 02/21/2015 0910   CALCIUM 9.0 03/23/2015 1618   CALCIUM 9.0 03/23/2015 1618   PROT 7.0 03/23/2015 1618   PROT 7.1 02/21/2015 0910   ALBUMIN 4.2 03/23/2015 1618   ALBUMIN 4.4 02/21/2015 0910   AST 21 03/23/2015 1618   ALT 19 03/23/2015 1618   ALKPHOS 150* 03/23/2015 1618   BILITOT 0.4  03/23/2015 1618   BILITOT 0.3 02/21/2015 0910   GFRNONAA >89 03/23/2015 1618   GFRNONAA 82 02/21/2015 0910   GFRAA >89 03/23/2015 1618   GFRAA 95 02/21/2015 0910   Lab Results  Component Value Date   CHOL 125 03/02/2015   HDL 45 03/02/2015   LDLCALC 59 03/02/2015   TRIG 106 03/02/2015   CHOLHDL 2.8 03/02/2015   No results found for: HGBA1C No results found for: [id number] Lab Results  Component Value Date   TSH 2.292 03/02/2015      ASSESSMENT AND PLAN 65 y.o. year old male  has a past medical history of History of alcoholism ([place]); ischemic vertebrobasilar artery thalamic stroke; Gait disorder; Dementia; Gingival hypertrophy; Seizures ([place]); Memory disorder (03/04/2013); Gait disorder; Stroke [facility]); Ulnar neuropathy of left upper extremity; and Alcoholism ([place]). here with:   1. Seizures   Patient will begin slowly weaning off of Dilantin. He is tolerating the increase in Keppra well. He will reduce his morning dose to 25 mg for one week and then eliminate this dose. He will continue taking Dilantin 100 mg in the evening. We will continue reducing his Dilantin every 4 weeks Until off the medication. Patient and his sister advised that if he has any seizure events patient let us know.     Ward Givens, MSN, NP-C 11/04/2015, 2:09 PM [facility] [address], [place], [place] [address] [phone]

## 2015-11-05 LAB — PHENYTOIN LEVEL, TOTAL: Phenytoin (Dilantin), Serum: 9 ug/mL — ABNORMAL LOW (ref 10.0–20.0)

## 2015-11-07 ENCOUNTER — Telehealth: Payer: Self-pay

## 2015-11-07 NOTE — Telephone Encounter (Signed)
Sister [name] [phone] returned call regarding results. Would like a call back, advised DPR not on file, may not be able to release information to her today.

## 2015-11-07 NOTE — Telephone Encounter (Signed)
Spoke to [place]. Gave lab results.

## 2015-11-07 NOTE — Telephone Encounter (Signed)
Called patient. Gave lab results. Patient verbalized understanding.  

## 2015-11-07 NOTE — Telephone Encounter (Signed)
-----   Message from Ward Givens, NP sent at 11/07/2015  7:55 AM EST ----- Patient is weaning off Dilantin therefore result is low. Please call the patient.

## 2015-11-15 ENCOUNTER — Other Ambulatory Visit: Payer: Self-pay | Admitting: Adult Health

## 2015-11-16 ENCOUNTER — Other Ambulatory Visit: Payer: Self-pay

## 2015-11-18 ENCOUNTER — Telehealth: Payer: Self-pay

## 2015-11-18 NOTE — Telephone Encounter (Signed)
Received Rx refill request for Phenytoin 50mg  chew tabs from pharmacy. Called pt to see if refill needed. Per Megan-NP last OV pt is to wean off over the next month. No answer.

## 2015-11-21 NOTE — Telephone Encounter (Signed)
Called left vmail.

## 2015-11-23 ENCOUNTER — Other Ambulatory Visit: Payer: Self-pay

## 2015-11-23 MED ORDER — PHENYTOIN 50 MG PO CHEW: 50.0000 mg | CHEWABLE_TABLET | Freq: Every day | ORAL | Status: DC

## 2015-12-04 ENCOUNTER — Other Ambulatory Visit: Payer: Self-pay | Admitting: Neurology

## 2015-12-04 ENCOUNTER — Other Ambulatory Visit: Payer: Self-pay | Admitting: Adult Health

## 2015-12-05 MED ORDER — PHENYTOIN 50 MG PO CHEW: 75.0000 mg | CHEWABLE_TABLET | Freq: Every day | ORAL | Status: DC

## 2015-12-05 NOTE — Telephone Encounter (Signed)
Called Ms [name] per provider instruction.  Got no answer.  Left message.

## 2015-12-05 NOTE — Telephone Encounter (Signed)
Please call the patient. If he is not had any seizure events he will decrease his Dilantin 100 mg at bedtime to 75 mg at bedtime. Please advise that I  called in the 50 mg tablet and he will take 1.5 tablets at bedtime. We will decrease his dose again in 2 weeks.

## 2015-12-08 ENCOUNTER — Other Ambulatory Visit: Payer: Self-pay

## 2015-12-08 DIAGNOSIS — E785 Hyperlipidemia, unspecified: Secondary | ICD-10-CM

## 2015-12-08 MED ORDER — SIMVASTATIN 20 MG PO TABS: 20.0000 mg | ORAL_TABLET | Freq: Every evening | ORAL | Status: DC

## 2016-01-01 ENCOUNTER — Other Ambulatory Visit: Payer: Self-pay | Admitting: Internal Medicine

## 2016-01-16 ENCOUNTER — Other Ambulatory Visit: Payer: Self-pay | Admitting: Adult Health

## 2016-01-18 ENCOUNTER — Encounter (HOSPITAL_COMMUNITY): Payer: Self-pay | Admitting: Emergency Medicine

## 2016-01-18 ENCOUNTER — Emergency Department (HOSPITAL_COMMUNITY)
Admission: EM | Admit: 2016-01-18 | Discharge: 2016-01-18 | Disposition: A | Discharge status: Home / Self Care | Payer: Medicare Other | Attending: Emergency Medicine | Admitting: Emergency Medicine

## 2016-01-18 DIAGNOSIS — Z8719 Personal history of other diseases of the digestive system: Secondary | ICD-10-CM | POA: Diagnosis not present

## 2016-01-18 DIAGNOSIS — Z8669 Personal history of other diseases of the nervous system and sense organs: Secondary | ICD-10-CM | POA: Diagnosis not present

## 2016-01-18 DIAGNOSIS — Z79899 Other long term (current) drug therapy: Secondary | ICD-10-CM | POA: Diagnosis not present

## 2016-01-18 DIAGNOSIS — R569 Unspecified convulsions: Secondary | ICD-10-CM

## 2016-01-18 DIAGNOSIS — Z8673 Personal history of transient ischemic attack (TIA), and cerebral infarction without residual deficits: Secondary | ICD-10-CM | POA: Diagnosis not present

## 2016-01-18 DIAGNOSIS — Z87891 Personal history of nicotine dependence: Secondary | ICD-10-CM | POA: Insufficient documentation

## 2016-01-18 DIAGNOSIS — F0390 Unspecified dementia without behavioral disturbance: Secondary | ICD-10-CM | POA: Insufficient documentation

## 2016-01-18 DIAGNOSIS — Z7982 Long term (current) use of aspirin: Secondary | ICD-10-CM | POA: Diagnosis not present

## 2016-01-18 DIAGNOSIS — G40909 Epilepsy, unspecified, not intractable, without status epilepticus: Secondary | ICD-10-CM | POA: Diagnosis not present

## 2016-01-18 LAB — PHENYTOIN LEVEL, TOTAL: Phenytoin Lvl: 2.5 ug/mL — ABNORMAL LOW (ref 10.0–20.0)

## 2016-01-18 LAB — ALBUMIN: Albumin: 3.7 g/dL (ref 3.5–5.0)

## 2016-01-18 MED ORDER — PHENYTOIN SODIUM 50 MG/ML IJ SOLN: 100.0000 mg | Freq: Three times a day (TID) | INTRAMUSCULAR | Status: DC

## 2016-01-18 MED ORDER — SODIUM CHLORIDE 0.9 % IV SOLN
1500.0000 mg | Freq: Once | INTRAVENOUS | Status: AC
  Administered 2016-01-18: 1500 mg via INTRAVENOUS
  Filled 2016-01-18: qty 30

## 2016-01-18 MED ORDER — PHENYTOIN SODIUM EXTENDED 100 MG PO CAPS: 100.0000 mg | ORAL_CAPSULE | Freq: Three times a day (TID) | ORAL | Status: DC

## 2016-01-18 NOTE — ED Provider Notes (Signed)
CSN: [id number]     Arrival date & time 01/18/16  1213 History   First MD Initiated Contact with Patient 01/18/16 1618     Chief Complaint  Patient presents with  . Seizures     (Consider location/radiation/quality/duration/timing/severity/associated sxs/prior Treatment) HPI Patient presents to the emergency department with a seizure that occurred earlier today.  The patient is currently being weaned off of Dilantin.  The wife states that they are weaning him down, Dilantin, and he takes 100 mg once a day, but she states that since they have done this she has had multiple seizures the wife offers the history as the patient will not talk with me and only looks over to the wife when I ask questions.  The wife states that nothing seems make the condition better or worse. The patient denies chest pain, shortness of breath, headache,blurred vision, neck pain, fever, cough, weakness, numbness, dizziness, anorexia, edema, abdominal pain, nausea, vomiting, diarrhea, rash, back pain, dysuria, hematemesis, bloody stool, near syncope, or syncope. Past Medical History  Diagnosis Date  . History of alcoholism ([place])   . Hx of ischemic vertebrobasilar artery thalamic stroke     Right  . Gait disorder   . Dementia   . 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 Seizures ([place])   . Memory disorder 03/04/2013  . Gait disorder   . Stroke [facility])     Right frontal, right thalamic  . Ulnar neuropathy of left upper extremity   . Alcoholism ([place])     History of, not active   Past Surgical History  Procedure Laterality Date  . Wrist surgery Left 95 & 96  . Hip arthroplasty Left 11/26/2013    Procedure: LEFT HIP HEMIARTHROPLASTY;  Surgeon: Mcarthur Rossetti, MD;  Location: [facility];  Service: Orthopedics;  Laterality: Left;   Family History  Problem Relation Age of Onset  . Pulmonary embolism Mother   . Liver disease Father   . Diabetes Sister   . Hypertension Sister   . Seizures Neg Hx   .  Hypertension Sister   . Hypertension Sister    Social History  Substance Use Topics  . Smoking status: Former Smoker -- 3.00 packs/day    Quit date: 01/02/2014  . Smokeless tobacco: Former Systems developer  . Alcohol Use: No     Comment: Former Alcoholic    Review of Systems  All other systems negative except as documented in the HPI. All pertinent positives and negatives as reviewed in the HPI.   Allergies  Review of patient's allergies indicates no known allergies.  Home Medications   Prior to Admission medications   Medication Sig Start Date End Date Taking? Authorizing Provider  aspirin EC 325 MG EC tablet Take 1 tablet (325 mg total) by mouth 2 (two) times daily after a meal. Patient taking differently: Take 325 mg by mouth daily.  11/28/13  Yes Mcarthur Rossetti, MD  Cholecalciferol (VITAMIN D-3) 1000 UNITS CAPS Take 1,000 Units by mouth daily.    Yes Historical Provider, MD  ibuprofen (ADVIL,MOTRIN) 200 MG tablet Take 400 mg by mouth 2 (two) times daily as needed for moderate pain.   Yes Historical Provider, MD  levETIRAcetam (KEPPRA) 1000 MG tablet TAKE 1 TABLET (1,000 MG TOTAL) BY MOUTH 2 (TWO) TIMES DAILY. 01/16/16  Yes Kathrynn Ducking, MD  memantine (NAMENDA) 10 MG tablet TAKE 1 TABLET (10 MG TOTAL) BY MOUTH 2 (TWO) TIMES DAILY. 12/05/15  Yes Ward Givens, NP  metoprolol  succinate (TOPROL-XL) 25 MG 24 hr tablet Take 1 tablet (25 mg total) by mouth daily. 06/20/15  Yes Lance Bosch, NP  phenytoin (DILANTIN) 100 MG ER capsule Take 1 capsule (100 mg total) by mouth at bedtime. 07/21/15  Yes Ward Givens, NP  simvastatin (ZOCOR) 20 MG tablet Take 1 tablet (20 mg total) by mouth every evening. 12/08/15  Yes Lance Bosch, NP  phenytoin (DILANTIN) 50 MG tablet Chew 1.5 tablets (75 mg total) by mouth at bedtime. 12/05/15   Ward Givens, NP   BP 135/92 mmHg  Pulse 72  Temp(Src) 98.3 F (36.8 C) (Oral)  Resp 17  Wt 77.678 kg  SpO2 98% Physical Exam  Constitutional: He is  oriented to person, place, and time. He appears well-developed and well-nourished. No distress.  HENT:  Head: Normocephalic and atraumatic.  Mouth/Throat: Oropharynx is clear and moist.  Eyes: Pupils are equal, round, and reactive to light.  Neck: Normal range of motion. Neck supple.  Cardiovascular: Normal rate, regular rhythm and normal heart sounds.  Exam reveals no gallop and no friction rub.   No murmur heard. Pulmonary/Chest: Effort normal and breath sounds normal. No respiratory distress. He has no wheezes.  Abdominal: Soft. Bowel sounds are normal. He exhibits no distension. There is no tenderness.  Neurological: He is alert and oriented to person, place, and time. He exhibits normal muscle tone. Coordination normal.  Skin: Skin is warm and dry. No rash noted. No erythema.  Psychiatric: He has a normal mood and affect. His behavior is normal.  Nursing note and vitals reviewed.   ED Course  Procedures (including critical care time) Labs Review Labs Reviewed  PHENYTOIN LEVEL, TOTAL - Abnormal; Notable for the following:    Phenytoin Lvl 2.5 (*)    All other components within normal limits  ALBUMIN  ALBUMIN    Imaging Review No results found. I have personally reviewed and evaluated these images and lab results as part of my medical decision-making.   EKG Interpretation   Date/Time:  Wednesday January 18 2016 13:05:03 EDT Ventricular Rate:  66 PR Interval:  164 QRS Duration: 84 QT Interval:  404 QTC Calculation: 423 R Axis:   54 Text Interpretation:  Normal sinus rhythm Normal ECG no significant change  since Oct 2015 Confirmed by Regenia Skeeter  MD, SCOTT ([id number]) on 01/18/2016  5:38:17 PM      Patient is given a loading dose of Dilantin.  He would like to be discharged home in good prescribing Dilantin 3 times a day per pharmacy recommendations also advised follow-up with his neurologist for further evaluation and care.  Patient and wife agree to the plan and all questions  were answered   Dalia Heading, PA-C 01/20/16 [id number]  Sherwood Gambler, MD 01/24/16 [phone]

## 2016-01-18 NOTE — Discharge Instructions (Signed)
Return here as needed.  Follow-up with your neurologist °

## 2016-01-18 NOTE — ED Notes (Signed)
Pts wife reports that pt has been having multiple seizures for tthe last two days. No recent changes in seizure medication. NAD at this time.

## 2016-01-18 NOTE — Progress Notes (Signed)
MEDICATION RELATED CONSULT NOTE - INITIAL   Pharmacy Consult for IV Phenytoin Indication: Seizures  No Known Allergies  Patient Measurements:   Adjusted Body Weight:   Vital Signs: Temp: 98.4 F (36.9 C) (03/29 1305) Temp Source: Oral (03/29 1305) BP: 137/78 mmHg (03/29 1658) Pulse Rate: 53 (03/29 1658) Intake/Output from previous day:   Intake/Output from this shift:    Labs: No results for input(s): WBC, HGB, HCT, PLT, APTT, CREATININE, LABCREA, CREATININE, CREAT24HRUR, MG, PHOS, ALBUMIN, PROT, ALBUMIN, AST, ALT, ALKPHOS, BILITOT, BILIDIR, IBILI in the last 72 hours. CrCl cannot be calculated (Unknown ideal weight.).   Microbiology: No results found for this or any previous visit (from the past 720 hour(s)).  Medical History: Past Medical History  Diagnosis Date  . History of alcoholism ([place])   . Hx of ischemic vertebrobasilar artery thalamic stroke     Right  . Gait disorder   . Dementia   . 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 Seizures ([place])   . Memory disorder 03/04/2013  . Gait disorder   . Stroke [facility])     Right frontal, right thalamic  . Ulnar neuropathy of left upper extremity   . Alcoholism ([place])     History of, not active    Medications:   (Not in a hospital admission) Scheduled:  Infusions:   Assessment: 65yo male with history of seizures on phenytoin PTA presents having multiple seizures for the last two days. Pharmacy is consulted to dose IV phenytoin for seizures. Pt takes phenytoin ER 100mg  daily however pt was weaning off of phenytoin per neurology notes in January and has a phenytoin level of 2.5 (was 9.0 on 11/04/15).  Goal of Therapy:  Total phenytoin level of 10-20 mcg/ml Reduction in seizures  Plan:  Phenytoin 1500mg  IV once followed by 100mg  IV q8h Recheck phenytoin level in a week  Andrey Cota. Diona Foley, PharmD, [place] Clinical Pharmacist Pager [phone] 01/18/2016,5:27 PM

## 2016-01-19 ENCOUNTER — Ambulatory Visit: Payer: Medicare Other | Admitting: Neurology

## 2016-01-20 ENCOUNTER — Telehealth: Payer: Self-pay | Admitting: Neurology

## 2016-01-20 NOTE — Telephone Encounter (Signed)
Patient arrived 1 day late for apt. Requesting to be seen on Monday as a work in if possible. Please call  [phone].

## 2016-01-20 NOTE — Telephone Encounter (Signed)
Attempted to call pt at number below.   VM picked up and mailbox full.

## 2016-01-23 NOTE — Telephone Encounter (Addendum)
I spoke to [place], and made appt for 0800 01-25-16 for seizures.  She verbalized understanding.

## 2016-01-23 NOTE — Telephone Encounter (Signed)
Sister [name] returned call about scheduling work-in appointment (reschedule from 3/30 no show), please call cell [phone] or home# [phone].

## 2016-01-25 ENCOUNTER — Ambulatory Visit (INDEPENDENT_AMBULATORY_CARE_PROVIDER_SITE_OTHER): Payer: Medicare Other | Admitting: Neurology

## 2016-01-25 ENCOUNTER — Encounter: Payer: Self-pay | Admitting: Neurology

## 2016-01-25 VITALS — BP 120/80 | HR 76 | Resp 18 | Ht 69.0 in | Wt 172.0 lb

## 2016-01-25 DIAGNOSIS — R413 Other amnesia: Secondary | ICD-10-CM

## 2016-01-25 DIAGNOSIS — R569 Unspecified convulsions: Secondary | ICD-10-CM | POA: Diagnosis not present

## 2016-01-25 DIAGNOSIS — R269 Unspecified abnormalities of gait and mobility: Secondary | ICD-10-CM

## 2016-01-25 MED ORDER — LEVETIRACETAM 750 MG PO TABS: 1500.0000 mg | ORAL_TABLET | Freq: Two times a day (BID) | ORAL | Status: DC

## 2016-01-25 NOTE — Progress Notes (Signed)
Reason for visit: Seizures  [name] is an 65 y.o. male  History of present illness:  [name] is a 65 year old left-handed black male with a history of cerebrovascular disease and associated seizures. The patient has been on Dilantin, but he has developed hepatitis C, and there has been some effort to get him off of this medication to a medication that does not go through the liver. The patient is on Keppra, taking 1000 g twice daily. He had a recurrent seizure on 01/18/2016. The patient was on 100 mg of Dilantin daily with the Keppra. In the emergency room, he was placed back on his Dilantin at 300 mg daily. He returns the office today for an evaluation. He has not had any further seizures since March 29. The patient has a gait disorder, he uses a cane for ambulation, he may have falls on occasion. On the day he went to the emergency room, he was having intermittent episodes of confusion that would come and go throughout the day consistent with seizures. The patient has a memory disorder as well. He is on Namenda for this. He returns to the office for an evaluation.  Past Medical History  Diagnosis Date  . History of alcoholism ([place])   . Hx of ischemic vertebrobasilar artery thalamic stroke     Right  . Gait disorder   . Dementia   . 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 Seizures ([place])   . Memory disorder 03/04/2013  . Gait disorder   . Stroke [facility])     Right frontal, right thalamic  . Ulnar neuropathy of left upper extremity   . Alcoholism ([place])     History of, not active    Past Surgical History  Procedure Laterality Date  . Wrist surgery Left 95 & 96  . Hip arthroplasty Left 11/26/2013    Procedure: LEFT HIP HEMIARTHROPLASTY;  Surgeon: Mcarthur Rossetti, MD;  Location: [facility];  Service: Orthopedics;  Laterality: Left;    Family History  Problem Relation Age of Onset  . Pulmonary embolism Mother   . Liver disease Father   . Diabetes Sister   .  Hypertension Sister   . Seizures Neg Hx   . Hypertension Sister   . Hypertension Sister     Social history:  reports that he quit smoking about 2 years ago. He has quit using smokeless tobacco. He reports that he does not drink alcohol or use illicit drugs.   No Known Allergies  Medications:  Prior to Admission medications   Medication Sig Start Date End Date Taking? Authorizing Provider  aspirin EC 325 MG EC tablet Take 1 tablet (325 mg total) by mouth 2 (two) times daily after a meal. Patient taking differently: Take 325 mg by mouth daily.  11/28/13   Mcarthur Rossetti, MD  Cholecalciferol (VITAMIN D-3) 1000 UNITS CAPS Take 1,000 Units by mouth daily.     Historical Provider, MD  ibuprofen (ADVIL,MOTRIN) 200 MG tablet Take 400 mg by mouth 2 (two) times daily as needed for moderate pain.    Historical Provider, MD  levETIRAcetam (KEPPRA) 1000 MG tablet TAKE 1 TABLET (1,000 MG TOTAL) BY MOUTH 2 (TWO) TIMES DAILY. 01/16/16   Kathrynn Ducking, MD  memantine (NAMENDA) 10 MG tablet TAKE 1 TABLET (10 MG TOTAL) BY MOUTH 2 (TWO) TIMES DAILY. 12/05/15   Ward Givens, NP  metoprolol succinate (TOPROL-XL) 25 MG 24 hr tablet Take 1 tablet (25 mg total)  by mouth daily. 06/20/15   Lance Bosch, NP  phenytoin (DILANTIN) 100 MG ER capsule Take 1 capsule (100 mg total) by mouth 3 (three) times daily. 01/18/16   Dalia Heading, PA-C  phenytoin (DILANTIN) 50 MG tablet Chew 1.5 tablets (75 mg total) by mouth at bedtime. 12/05/15   Ward Givens, NP  simvastatin (ZOCOR) 20 MG tablet Take 1 tablet (20 mg total) by mouth every evening. 12/08/15   Lance Bosch, NP    ROS:  Out of a complete 14 system review of symptoms, the patient complains only of the following symptoms, and all other reviewed systems are negative.  Seizures Gait disorder  Blood pressure 120/80, pulse 76, resp. rate 18, height 5\' 9"  (1.753 m), weight 172 lb (78.019 kg).  Physical Exam  General: The patient is alert and  cooperative at the time of the examination.  Skin: No significant peripheral edema is noted.   Neurologic Exam  Mental status: The patient is alert and oriented x 1 at the time of the examination (not oriented to date or place). The Mini-Mental Status Examination done today shows a total score of 9/30. The patient is able to name 5 animals in 30 seconds.   Cranial nerves: Facial symmetry is present. Speech is slightly dysarthric. Extraocular movements are full. Visual fields are full.  Motor: The patient has good strength in all 4 extremities.  Sensory examination: Soft touch sensation is decreased on the left face, arm, and leg.  Coordination: The patient has good finger-nose-finger and heel-to-shin bilaterally.  Gait and station: The patient has a wide-based gait. The patient uses a cane for ambulation. Tandem gait was not attempted. Romberg is negative.  Reflexes: Deep tendon reflexes are symmetric.   Assessment/Plan:  1. Intractable seizures  2. Cerebrovascular disease  3. Gait disorder  The patient will go up on the Keppra taking 1500 mg twice daily for 2 weeks, at that point he will taper off of the Dilantin again taking 200 mg at night for 3 weeks, then go to 100 mg at night until he is seen back in office. He will follow-up in 3 months. The patient does not operate a motor vehicle. The family is to contact me if he has another seizure.  Jill Alexanders MD 01/25/2016 6:49 PM  [facility] [address] [place], [place] [address]  Phone [phone] Fax [phone]

## 2016-01-25 NOTE — Patient Instructions (Addendum)
We will start keppra 750 mg taking 2 tablets twice a day. After 2 weeks, reduce the dilantin to 200 mg at night for 3 weeks, then go to 100 mg at night and stay on this dose until the next revisit.   Epilepsy Epilepsy is a disorder in which a person has repeated seizures over time. A seizure is a release of abnormal electrical activity in the brain. Seizures can cause a change in attention, behavior, or the ability to remain awake and alert (altered mental status). Seizures often involve uncontrollable shaking (convulsions).  Most people with epilepsy lead normal lives. However, people with epilepsy are at an increased risk of falls, accidents, and injuries. Therefore, it is important to begin treatment right away. CAUSES  Epilepsy has many possible causes. Anything that disturbs the normal pattern of brain cell activity can lead to seizures. This may include:   Head injury.  Birth trauma.  High fever as a child.  Stroke.  Bleeding into or around the brain.  Certain drugs.  Prolonged low oxygen, such as what occurs after CPR efforts.  Abnormal brain development.  Certain illnesses, such as meningitis, encephalitis (brain infection), malaria, and other infections.  An imbalance of nerve signaling chemicals (neurotransmitters).  SIGNS AND SYMPTOMS  The symptoms of a seizure can vary greatly from one person to another. Right before a seizure, you may have a warning (aura) that a seizure is about to occur. An aura may include the following symptoms:  Fear or anxiety.  Nausea.  Feeling like the room is spinning (vertigo).  Vision changes, such as seeing flashing lights or spots. Common symptoms during a seizure include:  Abnormal sensations, such as an abnormal smell or a bitter taste in the mouth.   Sudden, general body stiffness.   Convulsions that involve rhythmic jerking of the face, arm, or leg on one or both sides.   Sudden change in consciousness.    Appearing to be awake but not responding.   Appearing to be asleep but cannot be awakened.   Grimacing, chewing, lip smacking, drooling, tongue biting, or loss of bowel or bladder control. After a seizure, you may feel sleepy for a while. DIAGNOSIS  Your health care provider will ask about your symptoms and take a medical history. Descriptions from any witnesses to your seizures will be very helpful in the diagnosis. A physical exam, including a detailed neurological exam, is necessary. Various tests may be done, such as:   An electroencephalogram (EEG). This is a painless test of your brain waves. In this test, a diagram is created of your brain waves. These diagrams can be interpreted by a specialist.  An MRI of the brain.   A CT scan of the brain.   A spinal tap (lumbar puncture, LP).  Blood tests to check for signs of infection or abnormal blood chemistry. TREATMENT  There is no cure for epilepsy, but it is generally treatable. Once epilepsy is diagnosed, it is important to begin treatment as soon as possible. For most people with epilepsy, seizures can be controlled with medicines. The following may also be used:  A pacemaker for the brain (vagus nerve stimulator) can be used for people with seizures that are not well controlled by medicine.  Surgery on the brain. For some people, epilepsy eventually goes away. HOME CARE INSTRUCTIONS   Follow your health care provider's recommendations on driving and safety in normal activities.  Get enough rest. Lack of sleep can cause seizures.  Only take  over-the-counter or prescription medicines as directed by your health care provider. Take any prescribed medicine exactly as directed.  Avoid any known triggers of your seizures.  Keep a seizure diary. Record what you recall about any seizure, especially any possible trigger.   Make sure the people you live and work with know that you are prone to seizures. They should receive  instructions on how to help you. In general, a witness to a seizure should:   Cushion your head and body.   Turn you on your side.   Avoid unnecessarily restraining you.   Not place anything inside your mouth.   Call for emergency medical help if there is any question about what has occurred.   Follow up with your health care provider as directed. You may need regular blood tests to monitor the levels of your medicine.  SEEK MEDICAL CARE IF:   You develop signs of infection or other illness. This might increase the risk of a seizure.   You seem to be having more frequent seizures.   Your seizure pattern is changing.  SEEK IMMEDIATE MEDICAL CARE IF:   You have a seizure that does not stop after a few moments.   You have a seizure that causes any difficulty in breathing.   You have a seizure that results in a very severe headache.   You have a seizure that leaves you with the inability to speak or use a part of your body.    This information is not intended to replace advice given to you by your health care provider. Make sure you discuss any questions you have with your health care provider.   Document Released: 10/08/2005 Document Revised: 07/29/2013 Document Reviewed: 05/20/2013 Elsevier Interactive Patient Education [employer].

## 2016-01-29 ENCOUNTER — Other Ambulatory Visit: Payer: Self-pay | Admitting: Adult Health

## 2016-01-30 ENCOUNTER — Other Ambulatory Visit: Payer: Self-pay | Admitting: Neurology

## 2016-01-31 ENCOUNTER — Telehealth: Payer: Self-pay | Admitting: Neurology

## 2016-01-31 ENCOUNTER — Other Ambulatory Visit: Payer: Self-pay

## 2016-01-31 NOTE — Telephone Encounter (Signed)
Sister [name] called to request refill of phenytoin (DILANTIN) 100 MG ER capsule to [facility]

## 2016-01-31 NOTE — Telephone Encounter (Signed)
Rx refill previously retailed

## 2016-01-31 NOTE — Telephone Encounter (Signed)
Rx refill previously retailed w/ instructions to taper dose after 2 weeks on Keppra.

## 2016-02-24 ENCOUNTER — Other Ambulatory Visit: Payer: Self-pay | Admitting: Neurology

## 2016-03-20 ENCOUNTER — Ambulatory Visit: Payer: Medicare Other | Admitting: Internal Medicine

## 2016-04-11 ENCOUNTER — Other Ambulatory Visit: Payer: Self-pay

## 2016-04-11 MED ORDER — MEMANTINE HCL 10 MG PO TABS: 10.0000 mg | ORAL_TABLET | Freq: Two times a day (BID) | ORAL | Status: DC

## 2016-04-11 NOTE — Telephone Encounter (Signed)
Received faxed request for 90 day refills. Retailed per request.

## 2016-04-21 ENCOUNTER — Other Ambulatory Visit: Payer: Self-pay | Admitting: Neurology

## 2016-04-25 ENCOUNTER — Ambulatory Visit (INDEPENDENT_AMBULATORY_CARE_PROVIDER_SITE_OTHER): Payer: Medicare Other | Admitting: Adult Health

## 2016-04-25 ENCOUNTER — Encounter: Payer: Self-pay | Admitting: Adult Health

## 2016-04-25 VITALS — BP 106/70 | HR 68 | Resp 20 | Ht 69.0 in | Wt 175.2 lb

## 2016-04-25 DIAGNOSIS — R413 Other amnesia: Secondary | ICD-10-CM | POA: Diagnosis not present

## 2016-04-25 DIAGNOSIS — R569 Unspecified convulsions: Secondary | ICD-10-CM

## 2016-04-25 MED ORDER — PHENYTOIN 50 MG PO CHEW: 50.0000 mg | CHEWABLE_TABLET | Freq: Every day | ORAL | Status: DC

## 2016-04-25 NOTE — Patient Instructions (Signed)
Continue Keppra 1500 mg twice a day Decrease dilantin to 50 mg daily for 2 weeks then stop the medication If he had any seizure events let us know If your symptoms worsen or you develop new symptoms please let us know.

## 2016-04-25 NOTE — Progress Notes (Signed)
PATIENT: [name] DOB: 09-04-1951  REASON FOR VISIT: follow up- seizures HISTORY FROM: patient  HISTORY OF PRESENT ILLNESS: [name] is a 65 year old male with a history of cerebrovascular disease and seizures. He returns today for follow-up. At the last visit the patient's Keppra was increased to 1500 mg twice a day. The patient was placed back on 300 mg of Dilantin when he presented to the emergency room in March. At the last office visit he was instructed to wean back down to 100 mg of Dilantin. He has developed hepatitis C and in order for him to receive treatment it has been requested that he not be on Dilantin. We have slowly been trying to wean the patient off Dilantin. Patient reports he has not had any seizure events. His sister [name] is with him today. Patient and his sister report that his memory has remained stable. He is on Namenda. He does not operate a motor vehicle. He lives with his sister. He returns today for an evaluation.  HISTORY 01/25/16: [name] is a 65 year old left-handed black male with a history of cerebrovascular disease and associated seizures. The patient has been on Dilantin, but he has developed hepatitis C, and there has been some effort to get him off of this medication to a medication that does not go through the liver. The patient is on Keppra, taking 1000 g twice daily. He had a recurrent seizure on 01/18/2016. The patient was on 100 mg of Dilantin daily with the Keppra. In the emergency room, he was placed back on his Dilantin at 300 mg daily. He returns the office today for an evaluation. He has not had any further seizures since March 29. The patient has a gait disorder, he uses a cane for ambulation, he may have falls on occasion. On the day he went to the emergency room, he was having intermittent episodes of confusion that would come and go throughout the day consistent with seizures. The patient has a memory disorder as well. He is on Namenda for this.  He returns to the office for an evaluation.  REVIEW OF SYSTEMS: Out of a complete 14 system review of symptoms, the patient complains only of the following symptoms, and all other reviewed systems are negative.  Runny nose  ALLERGIES: No Known Allergies  HOME MEDICATIONS: Outpatient Prescriptions Prior to Visit  Medication Sig Dispense Refill  . aspirin EC 325 MG EC tablet Take 1 tablet (325 mg total) by mouth 2 (two) times daily after a meal. (Patient taking differently: Take 325 mg by mouth daily. ) 30 tablet 0  . Cholecalciferol (VITAMIN D-3) 1000 UNITS CAPS Take 1,000 Units by mouth daily.     Marland Kitchen ibuprofen (ADVIL,MOTRIN) 200 MG tablet Take 400 mg by mouth 2 (two) times daily as needed for moderate pain.    Marland Kitchen levETIRAcetam (KEPPRA) 750 MG tablet Take 2 tablets (1,500 mg total) by mouth 2 (two) times daily. 120 tablet 5  . memantine (NAMENDA) 10 MG tablet Take 1 tablet (10 mg total) by mouth 2 (two) times daily. 180 tablet 3  . metoprolol succinate (TOPROL-XL) 25 MG 24 hr tablet Take 1 tablet (25 mg total) by mouth daily. 90 tablet 3  . simvastatin (ZOCOR) 20 MG tablet Take 1 tablet (20 mg total) by mouth every evening. 30 tablet 5  . phenytoin (DILANTIN) 100 MG ER capsule AFTER WEEK 2 OF KEPPRA, DEC TO 2 CAPS X 3WEEKS, THEN 1 CAP AT BEDTIME UNTIL APPT 70 capsule  0  . phenytoin (DILANTIN) 50 MG tablet Chew 1.5 tablets (75 mg total) by mouth at bedtime. (Patient not taking: Reported on 01/25/2016) 45 tablet 3   No facility-administered medications prior to visit.    PAST MEDICAL HISTORY: Past Medical History  Diagnosis Date  . History of alcoholism ([place])   . Hx of ischemic vertebrobasilar artery thalamic stroke     Right  . Gait disorder   . Dementia   . 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 Seizures ([place])   . Memory disorder 03/04/2013  . Gait disorder   . Stroke [facility])     Right frontal, right thalamic  . Ulnar neuropathy of left upper extremity   . Alcoholism ([place])      History of, not active    PAST SURGICAL HISTORY: Past Surgical History  Procedure Laterality Date  . Wrist surgery Left 95 & 96  . Hip arthroplasty Left 11/26/2013    Procedure: LEFT HIP HEMIARTHROPLASTY;  Surgeon: Mcarthur Rossetti, MD;  Location: [facility];  Service: Orthopedics;  Laterality: Left;    FAMILY HISTORY: Family History  Problem Relation Age of Onset  . Pulmonary embolism Mother   . Liver disease Father   . Diabetes Sister   . Hypertension Sister   . Seizures Neg Hx   . Hypertension Sister   . Hypertension Sister     SOCIAL HISTORY: Social History   Social History  . Marital Status: Divorced    Spouse Name: N/A  . Number of Children: 0  . Years of Education: 10   Occupational History  . Not on file.   Social History Main Topics  . Smoking status: Former Smoker -- 3.00 packs/day    Quit date: 01/02/2014  . Smokeless tobacco: Former Systems developer  . Alcohol Use: No     Comment: Former Alcoholic  . Drug Use: No  . Sexual Activity: Yes   Other Topics Concern  . Not on file   Social History Narrative   Patient is single and lives with a friend and her son.   Patient does not have any children.   Patient is disabled.   Patient has a 10 th grade education.   Patient is left-handed.   Patient drinks some soda but not everyday.      PHYSICAL EXAM  Filed Vitals:   04/25/16 1014  BP: 106/70  Pulse: 68  Resp: 20  Height: 5\' 9"  (1.753 m)  Weight: 175 lb 3.2 oz (79.47 kg)   Body mass index is 25.86 kg/(m^2). MMSE - Mini Mental State Exam 09/28/2015 07/20/2015 01/07/2015  Orientation to time 0 2 3  Orientation to Place 3 5 2   Registration 3 3 3   Attention/ Calculation 0 0 0  Recall 0 0 1  Language- name 2 objects 2 2 2   Language- repeat 1 0 1  Language- follow 3 step command 3 3 0  Language- read & follow direction 0 0 1  Write a sentence 0 0 0  Copy design 0 0 0  Total score 12 15 13     Generalized: Well developed, in no acute distress    Neurological examination  Mentation: Alert. Follows all commands speech and language fluent Cranial nerve II-XII: Pupils were equal round reactive to light. Extraocular movements were full, visual field were full on confrontational test. Facial sensation and strength were normal. Uvula tongue midline. Head turning and shoulder shrug  were normal and symmetric. Motor: The motor testing reveals 5  over 5 strength of all 4 extremities. Good symmetric motor tone is noted throughout.  Sensory: Sensory testing is intact to soft touch on all 4 extremities. No evidence of extinction is noted.  Coordination: mild Dysmetria noted with finger-nose-finger and heel-to-shin bilaterally.  Gait and station: Uses a cane when ambulating. Gait is slightly unsteady. Tandem gait not attended..  Reflexes: Deep tendon reflexes are symmetric and normal bilaterally.   DIAGNOSTIC DATA (LABS, IMAGING, TESTING) - I reviewed patient records, labs, notes, testing and imaging myself where available.  Lab Results  Component Value Date   WBC 5.0 01/12/2015   HGB 14.2 01/12/2015   HCT 41.9 01/12/2015   MCV 95 01/12/2015   PLT 189 01/12/2015      Component Value Date/Time   NA 143 03/23/2015 1618   NA 149* 02/21/2015 0910   K 4.3 03/23/2015 1618   CL 103 03/23/2015 1618   CO2 26 03/23/2015 1618   GLUCOSE 96 03/23/2015 1618   GLUCOSE 80 02/21/2015 0910   BUN 11 03/23/2015 1618   BUN 12 02/21/2015 0910   CREATININE 0.90 03/23/2015 1618   CREATININE 0.98 02/21/2015 0910   CALCIUM 9.0 03/23/2015 1618   CALCIUM 9.0 03/23/2015 1618   PROT 7.0 03/23/2015 1618   PROT 7.1 02/21/2015 0910   ALBUMIN 3.7 01/18/2016 2115   ALBUMIN 4.4 02/21/2015 0910   AST 21 03/23/2015 1618   ALT 19 03/23/2015 1618   ALKPHOS 150* 03/23/2015 1618   BILITOT 0.4 03/23/2015 1618   BILITOT 0.3 02/21/2015 0910   GFRNONAA >89 03/23/2015 1618   GFRNONAA 82 02/21/2015 0910   GFRAA >89 03/23/2015 1618   GFRAA 95 02/21/2015 0910   Lab  Results  Component Value Date   CHOL 125 03/02/2015   HDL 45 03/02/2015   LDLCALC 59 03/02/2015   TRIG 106 03/02/2015   CHOLHDL 2.8 03/02/2015    Lab Results  Component Value Date   TSH 2.292 03/02/2015      ASSESSMENT AND PLAN 65 y.o. year old male  has a past medical history of History of alcoholism ([place]); ischemic vertebrobasilar artery thalamic stroke; Gait disorder; Dementia; Gingival hypertrophy; Seizures ([place]); Memory disorder (03/04/2013); Gait disorder; Stroke [facility]); Ulnar neuropathy of left upper extremity; and Alcoholism ([place]). here with:  1. Seizures 2. Cerebrovascular disease   3. Memory disturbance  The patient will continue on Keppra 1500 mg twice a day. I will decrease the patient's Dilantin down to 50 mg daily for 2 weeks then will discontinue the medication. If he has any additional seizure events he will let us know. We may need to add on an additional seizure medication in the future. The patient's memory score is stable. He will continue on Namenda. He will return in 2 months or sooner if needed.     Ward Givens, MSN, NP-C 04/25/2016, 10:34 AM [facility] [address] [place], [place] [address] [phone]

## 2016-05-08 ENCOUNTER — Other Ambulatory Visit: Payer: Self-pay | Admitting: Internal Medicine

## 2016-05-09 ENCOUNTER — Ambulatory Visit: Payer: Medicare Other | Admitting: Internal Medicine

## 2016-05-23 ENCOUNTER — Telehealth: Payer: Self-pay | Admitting: *Deleted

## 2016-05-23 ENCOUNTER — Other Ambulatory Visit: Payer: Medicare Other

## 2016-05-23 ENCOUNTER — Ambulatory Visit: Payer: Medicare Other | Attending: Internal Medicine | Admitting: Internal Medicine

## 2016-05-23 ENCOUNTER — Encounter: Payer: Self-pay | Admitting: Internal Medicine

## 2016-05-23 VITALS — BP 149/87 | HR 63 | Temp 98.1°F | Resp 16 | Wt 178.0 lb

## 2016-05-23 DIAGNOSIS — I6330 Cerebral infarction due to thrombosis of unspecified cerebral artery: Secondary | ICD-10-CM | POA: Diagnosis not present

## 2016-05-23 DIAGNOSIS — Z23 Encounter for immunization: Secondary | ICD-10-CM | POA: Diagnosis not present

## 2016-05-23 DIAGNOSIS — E785 Hyperlipidemia, unspecified: Secondary | ICD-10-CM | POA: Diagnosis not present

## 2016-05-23 DIAGNOSIS — Z87891 Personal history of nicotine dependence: Secondary | ICD-10-CM | POA: Diagnosis not present

## 2016-05-23 DIAGNOSIS — R413 Other amnesia: Secondary | ICD-10-CM

## 2016-05-23 DIAGNOSIS — R569 Unspecified convulsions: Secondary | ICD-10-CM

## 2016-05-23 DIAGNOSIS — I10 Essential (primary) hypertension: Secondary | ICD-10-CM | POA: Diagnosis not present

## 2016-05-23 DIAGNOSIS — Z131 Encounter for screening for diabetes mellitus: Secondary | ICD-10-CM | POA: Diagnosis not present

## 2016-05-23 DIAGNOSIS — Z1211 Encounter for screening for malignant neoplasm of colon: Secondary | ICD-10-CM | POA: Diagnosis not present

## 2016-05-23 DIAGNOSIS — Z79899 Other long term (current) drug therapy: Secondary | ICD-10-CM | POA: Insufficient documentation

## 2016-05-23 DIAGNOSIS — Z7982 Long term (current) use of aspirin: Secondary | ICD-10-CM | POA: Diagnosis not present

## 2016-05-23 DIAGNOSIS — R739 Hyperglycemia, unspecified: Secondary | ICD-10-CM | POA: Diagnosis not present

## 2016-05-23 DIAGNOSIS — B182 Chronic viral hepatitis C: Secondary | ICD-10-CM

## 2016-05-23 LAB — CBC WITH DIFFERENTIAL/PLATELET
BASOS PCT: 0 %
Basophils Absolute: 0 cells/uL (ref 0–200)
EOS PCT: 3 %
Eosinophils Absolute: 177 cells/uL (ref 15–500)
HEMATOCRIT: 41 % (ref 38.5–50.0)
HEMOGLOBIN: 13.9 g/dL (ref 13.2–17.1)
LYMPHS ABS: 1416 {cells}/uL (ref 850–3900)
Lymphocytes Relative: 24 %
MCH: 32.2 pg (ref 27.0–33.0)
MCHC: 33.9 g/dL (ref 32.0–36.0)
MCV: 94.9 fL (ref 80.0–100.0)
MONO ABS: 531 {cells}/uL (ref 200–950)
MPV: 10.6 fL (ref 7.5–12.5)
Monocytes Relative: 9 %
Neutro Abs: 3776 cells/uL (ref 1500–7800)
Neutrophils Relative %: 64 %
Platelets: 165 10*3/uL (ref 140–400)
RBC: 4.32 MIL/uL (ref 4.20–5.80)
RDW: 13.6 % (ref 11.0–15.0)
WBC: 5.9 10*3/uL (ref 3.8–10.8)

## 2016-05-23 LAB — BASIC METABOLIC PANEL WITH GFR
BUN: 10 mg/dL (ref 7–25)
CHLORIDE: 108 mmol/L (ref 98–110)
CO2: 25 mmol/L (ref 20–31)
Calcium: 9.2 mg/dL (ref 8.6–10.3)
Creat: 0.92 mg/dL (ref 0.70–1.25)
GFR, EST NON AFRICAN AMERICAN: 88 mL/min (ref >=60)
GFR, Est African American: >89 mL/min (ref >=60)
Glucose, Bld: 95 mg/dL (ref 65–99)
Potassium: 4.1 mmol/L (ref 3.5–5.3)
SODIUM: 143 mmol/L (ref 135–146)

## 2016-05-23 LAB — POCT GLYCOSYLATED HEMOGLOBIN (HGB A1C): HEMOGLOBIN A1C: 5.3

## 2016-05-23 MED ORDER — HYDROCHLOROTHIAZIDE 25 MG PO TABS: 25.0000 mg | ORAL_TABLET | Freq: Every day | ORAL | 3 refills | Status: DC

## 2016-05-23 MED ORDER — METOPROLOL SUCCINATE ER 25 MG PO TB24: 25.0000 mg | ORAL_TABLET | Freq: Every day | ORAL | 2 refills | Status: DC

## 2016-05-23 MED ORDER — SIMVASTATIN 20 MG PO TABS: 20.0000 mg | ORAL_TABLET | Freq: Every evening | ORAL | 5 refills | Status: DC

## 2016-05-23 NOTE — Telephone Encounter (Signed)
-----   Message from Thayer Headings, MD sent at 05/23/2016  1:53 PM EDT ----- This patient with hepatitis C had treatment deferred since he was off of dilantin.  He is now off and on Keppra.  Can we get him in for an NS5A test with a repeat HCV viral load (pt assistance) so we can get him Zepatier via [facility]. thanks

## 2016-05-23 NOTE — Progress Notes (Signed)
[name], is a 65 y.o. male  [id number]  [id number]  DOB - 08/30/1951  CC:  Chief Complaint  Patient presents with  . Establish Care       HPI: [name] is a 65 y.o. male here today to establish medical care, last seen in clinic 8/16, w/ significant pmhx chronic hep c cirrhosis (pending treatment per ID), old cva with residual left sided gaid d/o, uses cane, sz d/o, htn, memory deficits (on Namenda), hx of alcoholism.    Pt is currently here w/ his sister, who gives most of hx.  Pt currently not smoking or drinking etoh.  +adds salt to meals.    Per his sz, currently on only keppra 1500mg  po bid, off dilantin since last week.  Patient has No headache, No chest pain, No abdominal pain - No Nausea, No new weakness tingling or numbness, No Cough - SOB. No current sz.  Never had colonoscopy.   Review of Systems: Per HPI, o/w all systems reviewed and negative.   No Known Allergies Past Medical History:  Diagnosis Date  . Alcoholism ([place])    History of, not active  . Dementia   . Gait disorder   . Gait disorder   . 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 History of alcoholism ([place])   . Hx of ischemic vertebrobasilar artery thalamic stroke    Right  . Memory disorder 03/04/2013  . Seizures ([place])   . Stroke [facility])    Right frontal, right thalamic  . Ulnar neuropathy of left upper extremity    Current Outpatient Prescriptions on File Prior to Visit  Medication Sig Dispense Refill  . aspirin EC 325 MG EC tablet Take 1 tablet (325 mg total) by mouth 2 (two) times daily after a meal. (Patient taking differently: Take 325 mg by mouth daily. ) 30 tablet 0  . Cholecalciferol (VITAMIN D-3) 1000 UNITS CAPS Take 1,000 Units by mouth daily.     Marland Kitchen ibuprofen (ADVIL,MOTRIN) 200 MG tablet Take 400 mg by mouth 2 (two) times daily as needed for moderate pain.    Marland Kitchen levETIRAcetam (KEPPRA) 750 MG tablet Take 2 tablets (1,500 mg total) by mouth 2 (two) times daily.  120 tablet 5  . memantine (NAMENDA) 10 MG tablet Take 1 tablet (10 mg total) by mouth 2 (two) times daily. 180 tablet 3  . phenytoin (DILANTIN INFATABS) 50 MG tablet Chew 1 tablet (50 mg total) by mouth daily. For two weeks then stop medication 14 tablet 0   No current facility-administered medications on file prior to visit.    Family History  Problem Relation Age of Onset  . Pulmonary embolism Mother   . Liver disease Father   . Diabetes Sister   . Hypertension Sister   . Seizures Neg Hx   . Hypertension Sister   . Hypertension Sister    Social History   Social History  . Marital status: Divorced    Spouse name: N/A  . Number of children: 0  . Years of education: 10   Occupational History  . Not on file.   Social History Main Topics  . Smoking status: Former Smoker    Packs/day: 3.00    Quit date: 01/02/2014  . Smokeless tobacco: Former Systems developer  . Alcohol use No     Comment: Former Alcoholic  . Drug use: No  . Sexual activity: Yes   Other Topics Concern  . Not on file   Social History Narrative  Patient is single and lives with a friend and her son.   Patient does not have any children.   Patient is disabled.   Patient has a 10 th grade education.   Patient is left-handed.   Patient drinks some soda but not everyday.    Objective:   Vitals:   05/23/16 1049  BP: (!) 149/87  Pulse: 63  Resp: 16  Temp: 98.1 F (36.7 C)    Filed Weights   05/23/16 1049  Weight: 178 lb (80.7 kg)    BP Readings from Last 3 Encounters:  05/23/16 (!) 149/87  04/25/16 106/70  01/25/16 120/80    Physical Exam: Constitutional: Patient appears well-developed and well-nourished. No distress. AAOx3,  Speech limited throughout exam, sister does most of talking, unsteady gait, uses cane. HENT: Normocephalic, atraumatic, External right and left ear normal. Oropharynx is clear and moist.  bilat TMs clear. Eyes: Conjunctivae and EOM are normal. PERRL, no scleral icterus. Neck:  Normal ROM. Neck supple. No JVD.  CVS: RRR, S1/S2 +, no murmurs, no gallops, no carotid bruit.  Pulmonary: Effort and breath sounds normal, no stridor, rhonchi, wheezes, rales.  Abdominal: Soft. BS +, no distension, tenderness, rebound or guarding.  Musculoskeletal: Normal range of motion. No edema and no tenderness.  LE: bilat/ no c/c/e, pulses 2+ bilateral. Lymphadenopathy: No lymphadenopathy noted, cervical Neuro: Alert.  muscle tone coordination wnl. No cranial nerve deficit grossly. Skin: Skin is warm and dry. No rash noted. Not diaphoretic. No erythema. No pallor. Psychiatric: Normal mood and affect. Behavior, judgment, thought content normal.  Lab Results  Component Value Date   WBC 5.0 01/12/2015   HGB 14.2 01/12/2015   HCT 41.9 01/12/2015   MCV 95 01/12/2015   PLT 189 01/12/2015   Lab Results  Component Value Date   CREATININE 0.90 03/23/2015   BUN 11 03/23/2015   NA 143 03/23/2015   K 4.3 03/23/2015   CL 103 03/23/2015   CO2 26 03/23/2015    No results found for: HGBA1C Lipid Panel     Component Value Date/Time   CHOL 125 03/02/2015 1200   TRIG 106 03/02/2015 1200   HDL 45 03/02/2015 1200   CHOLHDL 2.8 03/02/2015 1200   VLDL 21 03/02/2015 1200   LDLCALC 59 03/02/2015 1200       Depression screen PHQ 2/9 05/23/2016 09/22/2015 04/27/2015 02/23/2015  Decreased Interest 0 0 0 0  Down, Depressed, Hopeless 0 0 0 0  PHQ - 2 Score 0 0 0 0    Assessment and plan:   1. Dyslipidemia Not currently fasting, asked them to come back next few days fasting for lab draw. - Lipid Panel; Future - simvastatin (ZOCOR) 20 MG tablet; Take 1 tablet (20 mg total) by mouth every evening.  Dispense: 90 tablet; Refill: 5  2. HTN (hypertension), benign Goal sbp <130/90, DASH /low salt diet recd, - BASIC METABOLIC PANEL WITH GFR - CBC with Differential/Platelet - continue metoprolol xl 25 qd  - hctz 25 qd added  3. Encounter for screening examination for impaired glucose regulation  and diabetes mellitus, hx of hyperglycemia in past chk - POCT glycosylated hemoglobin (Hb A1C)  4. Colon cancer screening - Ambulatory referral to Gastroenterology  5. Cerebral thrombosis with cerebral infarction ([place]) Followed by neuro, recd ASA 325 qday, continue statin.  6. Chronic hepatitis C without hepatic coma ([place]) Per Dr Linus Salmons, considering starting trx for hep c after off dilantin.  7. sz d/o Currently only on keppra 1500mg  po bid,  per sister off dilantin since last week. Defer to neuro.,  8. Memory disorder On namenda, defer to Neuro.  9. Health maintenance - needs colonoscopy, per sister/pt never had,  referral to gi - tdap today.  Return in about 3 months (around 08/23/2016).  The patient was given clear instructions to go to ER or return to medical center if symptoms don't improve, worsen or new problems develop. The patient verbalized understanding. The patient was told to call to get lab results if they haven't heard anything in the next week.    This note has been created with Surveyor, quantity. Any transcriptional errors are unintentional.   Maren Reamer, MD, [place], [place]   05/23/2016, 11:15 AM

## 2016-05-23 NOTE — Telephone Encounter (Signed)
Patient notified and scheduled for labs. [name]

## 2016-05-23 NOTE — Patient Instructions (Addendum)
DASH Eating Plan DASH stands for "Dietary Approaches to Stop Hypertension." The DASH eating plan is a healthy eating plan that has been shown to reduce high blood pressure (hypertension). Additional health benefits may include reducing the risk of type 2 diabetes mellitus, heart disease, and stroke. The DASH eating plan may also help with weight loss. WHAT DO I NEED TO KNOW ABOUT THE DASH EATING PLAN? For the DASH eating plan, you will follow these general guidelines:  Choose foods with a percent daily value for sodium of less than 5% (as listed on the food label).  Use salt-free seasonings or herbs instead of table salt or sea salt.  Check with your health care provider or pharmacist before using salt substitutes.  Eat lower-sodium products, often labeled as "lower sodium" or "no salt added."  Eat fresh foods.  Eat more vegetables, fruits, and low-fat dairy products.  Choose whole grains. Look for the word "whole" as the first word in the ingredient list.  Choose fish and skinless chicken or turkey more often than red meat. Limit fish, poultry, and meat to 6 oz (170 g) each day.  Limit sweets, desserts, sugars, and sugary drinks.  Choose heart-healthy fats.  Limit cheese to 1 oz (28 g) per day.  Eat more home-cooked food and less restaurant, buffet, and fast food.  Limit fried foods.  Cook foods using methods other than frying.  Limit canned vegetables. If you do use them, rinse them well to decrease the sodium.  When eating at a restaurant, ask that your food be prepared with less salt, or no salt if possible. WHAT FOODS CAN I EAT? Seek help from a dietitian for individual calorie needs. Grains Whole grain or whole wheat bread. Brown rice. Whole grain or whole wheat pasta. Quinoa, bulgur, and whole grain cereals. Low-sodium cereals. Corn or whole wheat flour tortillas. Whole grain cornbread. Whole grain crackers. Low-sodium crackers. Vegetables Fresh or frozen vegetables  (raw, steamed, roasted, or grilled). Low-sodium or reduced-sodium tomato and vegetable juices. Low-sodium or reduced-sodium tomato sauce and paste. Low-sodium or reduced-sodium canned vegetables.  Fruits All fresh, canned (in natural juice), or frozen fruits. Meat and Other Protein Products Ground beef (85% or leaner), grass-fed beef, or beef trimmed of fat. Skinless chicken or turkey. Ground chicken or turkey. Pork trimmed of fat. All fish and seafood. Eggs. Dried beans, peas, or lentils. Unsalted nuts and seeds. Unsalted canned beans. Dairy Low-fat dairy products, such as skim or 1% milk, 2% or reduced-fat cheeses, low-fat ricotta or cottage cheese, or plain low-fat yogurt. Low-sodium or reduced-sodium cheeses. Fats and Oils Tub margarines without trans fats. Light or reduced-fat mayonnaise and salad dressings (reduced sodium). Avocado. Safflower, olive, or canola oils. Natural peanut or almond butter. Other Unsalted popcorn and pretzels. The items listed above may not be a complete list of recommended foods or beverages. Contact your dietitian for more options. WHAT FOODS ARE NOT RECOMMENDED? Grains White bread. White pasta. White rice. Refined cornbread. Bagels and croissants. Crackers that contain trans fat. Vegetables Creamed or fried vegetables. Vegetables in a cheese sauce. Regular canned vegetables. Regular canned tomato sauce and paste. Regular tomato and vegetable juices. Fruits Dried fruits. Canned fruit in light or heavy syrup. Fruit juice. Meat and Other Protein Products Fatty cuts of meat. Ribs, chicken wings, bacon, sausage, bologna, salami, chitterlings, fatback, hot dogs, bratwurst, and packaged luncheon meats. Salted nuts and seeds. Canned beans with salt. Dairy Whole or 2% milk, cream, half-and-half, and cream cheese. Whole-fat or sweetened yogurt. Full-fat   cheeses or blue cheese. Nondairy creamers and whipped toppings. Processed cheese, cheese spreads, or cheese  curds. Condiments Onion and garlic salt, seasoned salt, table salt, and sea salt. Canned and packaged gravies. Worcestershire sauce. Tartar sauce. Barbecue sauce. Teriyaki sauce. Soy sauce, including reduced sodium. Steak sauce. Fish sauce. Oyster sauce. Cocktail sauce. Horseradish. Ketchup and mustard. Meat flavorings and tenderizers. Bouillon cubes. Hot sauce. Tabasco sauce. Marinades. Taco seasonings. Relishes. Fats and Oils Butter, stick margarine, lard, shortening, ghee, and bacon fat. Coconut, palm kernel, or palm oils. Regular salad dressings. Other Pickles and olives. Salted popcorn and pretzels. The items listed above may not be a complete list of foods and beverages to avoid. Contact your dietitian for more information. WHERE CAN I FIND MORE INFORMATION? National Heart, Lung, and Blood Institute: travelstabloid.com   This information is not intended to replace advice given to you by your health care provider. Make sure you discuss any questions you have with your health care provider.   Document Released: 09/27/2011 Document Revised: 10/29/2014 Document Reviewed: 08/12/2013 Elsevier Interactive Patient Education 2016 Elsevier Inc.  -  Hypertension Hypertension is another name for high blood pressure. High blood pressure forces your heart to work harder to pump blood. A blood pressure reading has two numbers, which includes a higher number over a lower number (example: 110/72). HOME CARE   Have your blood pressure rechecked by your doctor.  Only take medicine as told by your doctor. Follow the directions carefully. The medicine does not work as well if you skip doses. Skipping doses also puts you at risk for problems.  Do not smoke.  Monitor your blood pressure at home as told by your doctor. GET HELP IF:  You think you are having a reaction to the medicine you are taking.  You have repeat headaches or feel dizzy.  You have puffiness  (swelling) in your ankles.  You have trouble with your vision. GET HELP RIGHT AWAY IF:   You get a very bad headache and are confused.  You feel weak, numb, or faint.  You get chest or belly (abdominal) pain.  You throw up (vomit).  You cannot breathe very well. MAKE SURE YOU:   Understand these instructions.  Will watch your condition.  Will get help right away if you are not doing well or get worse.   This information is not intended to replace advice given to you by your health care provider. Make sure you discuss any questions you have with your health care provider.   Document Released: 03/26/2008 Document Revised: 10/13/2013 Document Reviewed: 07/31/2013 Elsevier Interactive Patient Education 2016 [employer].  -  Colonoscopy A colonoscopy is an exam to look at the entire large intestine (colon). This exam can help find problems such as tumors, polyps, inflammation, and areas of bleeding. The exam takes about 1 hour.  LET [facility] CARE PROVIDER KNOW ABOUT:   Any allergies you have.  All medicines you are taking, including vitamins, herbs, eye drops, creams, and over-the-counter medicines.  Previous problems you or members of your family have had with the use of anesthetics.  Any blood disorders you have.  Previous surgeries you have had.  Medical conditions you have. RISKS AND COMPLICATIONS  Generally, this is a safe procedure. However, as with any procedure, complications can occur. Possible complications include:  Bleeding.  Tearing or rupture of the colon wall.  Reaction to medicines given during the exam.  Infection (rare). BEFORE THE PROCEDURE   Ask your health care provider  about changing or stopping your regular medicines.  You may be prescribed an oral bowel prep. This involves drinking a large amount of medicated liquid, starting the day before your procedure. The liquid will cause you to have multiple loose stools until your stool is  almost clear or light green. This cleans out your colon in preparation for the procedure.  Do not eat or drink anything else once you have started the bowel prep, unless your health care provider tells you it is safe to do so.  Arrange for someone to drive you home after the procedure. PROCEDURE   You will be given medicine to help you relax (sedative).  You will lie on your side with your knees bent.  A long, flexible tube with a light and camera on the end (colonoscope) will be inserted through the rectum and into the colon. The camera sends video back to a computer screen as it moves through the colon. The colonoscope also releases carbon dioxide gas to inflate the colon. This helps your health care provider see the area better.  During the exam, your health care provider may take a small tissue sample (biopsy) to be examined under a microscope if any abnormalities are found.  The exam is finished when the entire colon has been viewed. AFTER THE PROCEDURE   Do not drive for 24 hours after the exam.  You may have a small amount of blood in your stool.  You may pass moderate amounts of gas and have mild abdominal cramping or bloating. This is caused by the gas used to inflate your colon during the exam.  Ask when your test results will be ready and how you will get your results. Make sure you get your test results.   This information is not intended to replace advice given to you by your health care provider. Make sure you discuss any questions you have with your health care provider.   Document Released: 10/05/2000 Document Revised: 07/29/2013 Document Reviewed: 06/15/2013 Elsevier Interactive Patient Education 2016 [employer]. Tdap Vaccine (Tetanus, Diphtheria and Pertussis): What You Need to Know 1. Why get vaccinated? Tetanus, diphtheria and pertussis are very serious diseases. Tdap vaccine can protect [place] from these diseases. And, Tdap vaccine given to pregnant women can  protect newborn babies against pertussis. TETANUS (Lockjaw) is rare in the [place] today. It causes painful muscle tightening and stiffness, usually all over the body.  It can lead to tightening of muscles in the head and neck so you can't open your mouth, swallow, or sometimes even breathe. Tetanus kills about 1 out of 10 people who are infected even after receiving the best medical care. DIPHTHERIA is also rare in the [place] today. It can cause a thick coating to form in the back of the throat.  It can lead to breathing problems, heart failure, paralysis, and death. PERTUSSIS (Whooping Cough) causes severe coughing spells, which can cause difficulty breathing, vomiting and disturbed sleep.  It can also lead to weight loss, incontinence, and rib fractures. Up to 2 in 100 adolescents and 5 in 100 adults with pertussis are hospitalized or have complications, which could include pneumonia or death. These diseases are caused by bacteria. Diphtheria and pertussis are spread from person to person through secretions from coughing or sneezing. Tetanus enters the body through cuts, scratches, or wounds. Before vaccines, as many as 200,000 cases of diphtheria, 200,000 cases of pertussis, and hundreds of cases of tetanus, were reported in the [place] each  year. Since vaccination began, reports of cases for tetanus and diphtheria have dropped by about 99% and for pertussis by about 80%. 2. Tdap vaccine Tdap vaccine can protect adolescents and adults from tetanus, diphtheria, and pertussis. One dose of Tdap is routinely given at age 32 or 67. People who did not get Tdap at that age should get it as soon as possible. Tdap is especially important for healthcare professionals and anyone having close contact with a baby younger than 12 months. Pregnant women should get a dose of Tdap during every pregnancy, to protect the newborn from pertussis. Infants are most at risk for severe, life-threatening  complications from pertussis. Another vaccine, called Td, protects against tetanus and diphtheria, but not pertussis. A Td booster should be given every 10 years. Tdap may be given as one of these boosters if you have never gotten Tdap before. Tdap may also be given after a severe cut or burn to prevent tetanus infection. Your doctor or the person giving you the vaccine can give you more information. Tdap may safely be given at the same time as other vaccines. 3. Some people should not get this vaccine  A person who has ever had a life-threatening allergic reaction after a previous dose of any diphtheria, tetanus or pertussis containing vaccine, OR has a severe allergy to any part of this vaccine, should not get Tdap vaccine. Tell the person giving the vaccine about any severe allergies.  Anyone who had coma or long repeated seizures within 7 days after a childhood dose of DTP or DTaP, or a previous dose of Tdap, should not get Tdap, unless a cause other than the vaccine was found. They can still get Td.  Talk to your doctor if you:  have seizures or another nervous system problem,  had severe pain or swelling after any vaccine containing diphtheria, tetanus or pertussis,  ever had a condition called Guillain-Barr Syndrome (GBS),  aren't feeling well on the day the shot is scheduled. 4. Risks With any medicine, including vaccines, there is a chance of side effects. These are usually mild and go away on their own. Serious reactions are also possible but are rare. Most people who get Tdap vaccine do not have any problems with it. Mild problems following Tdap (Did not interfere with activities)  Pain where the shot was given (about 3 in 4 adolescents or 2 in 3 adults)  Redness or swelling where the shot was given (about 1 person in 5)  Mild fever of at least 100.34F (up to about 1 in 25 adolescents or 1 in 100 adults)  Headache (about 3 or 4 people in 10)  Tiredness (about 1 person in  3 or 4)  Nausea, vomiting, diarrhea, stomach ache (up to 1 in 4 adolescents or 1 in 10 adults)  Chills, sore joints (about 1 person in 10)  Body aches (about 1 person in 3 or 4)  Rash, swollen glands (uncommon) Moderate problems following Tdap (Interfered with activities, but did not require medical attention)  Pain where the shot was given (up to 1 in 5 or 6)  Redness or swelling where the shot was given (up to about 1 in 16 adolescents or 1 in 12 adults)  Fever over 102F (about 1 in 100 adolescents or 1 in 250 adults)  Headache (about 1 in 7 adolescents or 1 in 10 adults)  Nausea, vomiting, diarrhea, stomach ache (up to 1 or 3 people in 100)  Swelling of the entire arm where  the shot was given (up to about 1 in 500). Severe problems following Tdap (Unable to perform usual activities; required medical attention)  Swelling, severe pain, bleeding and redness in the arm where the shot was given (rare). Problems that could happen after any vaccine:  People sometimes faint after a medical procedure, including vaccination. Sitting or lying down for about 15 minutes can help prevent fainting, and injuries caused by a fall. Tell your doctor if you feel dizzy, or have vision changes or ringing in the ears.  Some people get severe pain in the shoulder and have difficulty moving the arm where a shot was given. This happens very rarely.  Any medication can cause a severe allergic reaction. Such reactions from a vaccine are very rare, estimated at fewer than 1 in a million doses, and would happen within a few minutes to a few hours after the vaccination. As with any medicine, there is a very remote chance of a vaccine causing a serious injury or death. The safety of vaccines is always being monitored. For more information, visit: http://www.aguilar.org/ 5. What if there is a serious problem? What should I look for?  Look for anything that concerns you, such as signs of a severe  allergic reaction, very high fever, or unusual behavior.  Signs of a severe allergic reaction can include hives, swelling of the face and throat, difficulty breathing, a fast heartbeat, dizziness, and weakness. These would usually start a few minutes to a few hours after the vaccination. What should I do?  If you think it is a severe allergic reaction or other emergency that can't wait, call 9-1-1 or get the person to the nearest hospital. Otherwise, call your doctor.  Afterward, the reaction should be reported to the Vaccine Adverse Event Reporting System (VAERS). Your doctor might file this report, or you can do it yourself through the VAERS web site at www.vaers.SamedayNews.es, or by calling (302)602-8702. VAERS does not give medical advice.  6. The National Vaccine Injury Compensation Program The [employer] Vaccine Injury Compensation Program (VICP) is a federal program that was created to compensate people who may have been injured by certain vaccines. Persons who believe they may have been injured by a vaccine can learn about the program and about filing a claim by calling 785-395-0574 or visiting the [place] website at GoldCloset.com.ee. There is a time limit to file a claim for compensation. 7. How can I learn more?  Ask your doctor. He or she can give you the vaccine package insert or suggest other sources of information.  Call your local or state health department.  Contact the Centers for Disease Control and Prevention (CDC):  Call 801-457-2352 (1-800-CDC-INFO) or  Visit CDC's website at http://hunter.com/ CDC Tdap Vaccine VIS (12/15/13)   This information is not intended to replace advice given to you by your health care provider. Make sure you discuss any questions you have with your health care provider.   Document Released: 04/08/2012 Document Revised: 10/29/2014 Document Reviewed: 01/20/2014 Elsevier Interactive Patient Education [employer].

## 2016-05-25 ENCOUNTER — Telehealth: Payer: Self-pay

## 2016-05-25 NOTE — Telephone Encounter (Signed)
Contacted pt to go over lab results. Spoke with pt sister and she is aware of results and schedule him an lab appt for Tuesday

## 2016-05-29 ENCOUNTER — Ambulatory Visit: Payer: Medicare Other | Attending: Internal Medicine

## 2016-05-29 ENCOUNTER — Other Ambulatory Visit: Payer: Self-pay | Admitting: Pharmacist Clinician (PhC)/ Clinical Pharmacy Specialist

## 2016-05-29 ENCOUNTER — Other Ambulatory Visit: Payer: Medicare Other

## 2016-05-29 ENCOUNTER — Ambulatory Visit (HOSPITAL_COMMUNITY)
Admission: EM | Admit: 2016-05-29 | Discharge: 2016-05-29 | Disposition: A | Discharge status: Home / Self Care | Payer: Medicare Other | Attending: Family Medicine | Admitting: Family Medicine

## 2016-05-29 ENCOUNTER — Encounter (HOSPITAL_COMMUNITY): Payer: Self-pay | Admitting: Emergency Medicine

## 2016-05-29 DIAGNOSIS — B1920 Unspecified viral hepatitis C without hepatic coma: Secondary | ICD-10-CM

## 2016-05-29 DIAGNOSIS — B182 Chronic viral hepatitis C: Secondary | ICD-10-CM | POA: Diagnosis not present

## 2016-05-29 DIAGNOSIS — E785 Hyperlipidemia, unspecified: Secondary | ICD-10-CM | POA: Diagnosis not present

## 2016-05-29 DIAGNOSIS — B353 Tinea pedis: Secondary | ICD-10-CM | POA: Diagnosis not present

## 2016-05-29 LAB — LIPID PANEL
CHOLESTEROL: 118 mg/dL — AB (ref 125–200)
HDL: 47 mg/dL (ref >=40)
LDL Cholesterol: 55 mg/dL (ref <130)
TRIGLYCERIDES: 79 mg/dL (ref <150)
Total CHOL/HDL Ratio: 2.5 Ratio (ref <=5.0)
VLDL: 16 mg/dL (ref <30)

## 2016-05-29 MED ORDER — KETOCONAZOLE 2 % EX CREA: 1.0000 "application " | TOPICAL_CREAM | Freq: Every day | CUTANEOUS | 0 refills | Status: DC

## 2016-05-29 NOTE — ED Notes (Signed)
Escorted patient to car by wheelchair

## 2016-05-29 NOTE — ED Triage Notes (Signed)
Patient and his sister are present today for questionable infection of toes.  Raw areas between toes for a week

## 2016-05-30 ENCOUNTER — Telehealth: Payer: Self-pay

## 2016-05-30 ENCOUNTER — Other Ambulatory Visit: Payer: Self-pay | Admitting: Internal Medicine

## 2016-05-30 DIAGNOSIS — E785 Hyperlipidemia, unspecified: Secondary | ICD-10-CM

## 2016-05-30 LAB — HEPATITIS C RNA QUANTITATIVE
HCV QUANT LOG: 6.38 {Log} — AB (ref <1.18)
HCV QUANT: 2401090 [IU]/mL — AB (ref <15)

## 2016-05-30 MED ORDER — SIMVASTATIN 20 MG PO TABS: 10.0000 mg | ORAL_TABLET | Freq: Every evening | ORAL | 5 refills | Status: DC

## 2016-05-30 NOTE — Telephone Encounter (Signed)
Contacted pt to go over lab results pt did not answer lvm for pt to give me a call back at his earliest convenience  

## 2016-06-01 ENCOUNTER — Telehealth: Payer: Self-pay

## 2016-06-01 NOTE — Telephone Encounter (Signed)
Contacted pt to go over lab results pt did not answer lvm for pt to give me a call back at his earliest convenience will be mailing letter out today

## 2016-06-02 LAB — HCV RNA NS5A DRUG RESISTANCE

## 2016-06-04 ENCOUNTER — Telehealth: Payer: Self-pay | Admitting: Internal Medicine

## 2016-06-04 ENCOUNTER — Telehealth: Payer: Self-pay

## 2016-06-04 NOTE — Telephone Encounter (Signed)
Returned pt call pt did not answer lvm for pt to give me a call back at his earliest convenience

## 2016-06-04 NOTE — Telephone Encounter (Signed)
Pt. returned call. Please f/u

## 2016-06-14 ENCOUNTER — Telehealth: Payer: Self-pay | Admitting: Neurology

## 2016-06-14 NOTE — Telephone Encounter (Signed)
[name] called back in to says she made a mistake. She is getting the right amount.

## 2016-06-14 NOTE — Telephone Encounter (Signed)
Pt's sister, Deep Simerson, called and says she has been giving the pt 4 pills of   levETIRAcetam (KEPPRA) 750 MG tablet    a day, but the pharmacy has not been giving them the correct amount of medication. She has been running out. She Is requesting a new rx be sent to reflect to correct dosage and quantity pt is to have.May call  [phone]

## 2016-06-15 ENCOUNTER — Telehealth: Payer: Self-pay

## 2016-06-15 NOTE — Telephone Encounter (Signed)
Pt. Called requesting his lab results. Please f.u

## 2016-06-15 NOTE — Telephone Encounter (Signed)
Returned pt call to go over lab results spoke with pt sister about labs and she is aware of results and medication change

## 2016-06-26 ENCOUNTER — Encounter: Payer: Self-pay | Admitting: Internal Medicine

## 2016-06-28 ENCOUNTER — Telehealth: Payer: Self-pay

## 2016-06-28 ENCOUNTER — Ambulatory Visit: Payer: Medicare Other | Admitting: Adult Health

## 2016-06-28 NOTE — Telephone Encounter (Signed)
Patient did not show to appt today  

## 2016-06-29 ENCOUNTER — Encounter: Payer: Self-pay | Admitting: Adult Health

## 2016-07-23 ENCOUNTER — Telehealth: Payer: Self-pay | Admitting: Pharmacist Clinician (PhC)/ Clinical Pharmacy Specialist

## 2016-07-23 NOTE — Telephone Encounter (Signed)
Got a called today from a family member about getting hep C meds for [place]. Tried to call back last week but she said the family member didn't notify anyone of the message. Schedule his appt on the phone to come back this week after the neuro appt. He has 1a, NS5A neg, F3/4, off of Dilantin.

## 2016-07-25 ENCOUNTER — Ambulatory Visit: Payer: Medicare Other | Admitting: Adult Health

## 2016-07-26 ENCOUNTER — Encounter: Payer: Self-pay | Admitting: Adult Health

## 2016-07-26 ENCOUNTER — Ambulatory Visit (INDEPENDENT_AMBULATORY_CARE_PROVIDER_SITE_OTHER): Payer: Medicare Other | Admitting: Adult Health

## 2016-07-26 ENCOUNTER — Ambulatory Visit (INDEPENDENT_AMBULATORY_CARE_PROVIDER_SITE_OTHER): Payer: Medicare Other | Admitting: Pharmacist Clinician (PhC)/ Clinical Pharmacy Specialist

## 2016-07-26 VITALS — BP 100/82 | HR 80 | Ht 69.0 in | Wt 177.4 lb

## 2016-07-26 DIAGNOSIS — B182 Chronic viral hepatitis C: Secondary | ICD-10-CM

## 2016-07-26 DIAGNOSIS — I6330 Cerebral infarction due to thrombosis of unspecified cerebral artery: Secondary | ICD-10-CM

## 2016-07-26 DIAGNOSIS — R569 Unspecified convulsions: Secondary | ICD-10-CM

## 2016-07-26 DIAGNOSIS — Z8673 Personal history of transient ischemic attack (TIA), and cerebral infarction without residual deficits: Secondary | ICD-10-CM

## 2016-07-26 MED ORDER — LEDIPASVIR-SOFOSBUVIR 90-400 MG PO TABS: 1.0000 | ORAL_TABLET | Freq: Every day | ORAL | 2 refills | Status: DC

## 2016-07-26 NOTE — Patient Instructions (Signed)
Continue Keppra 1500 mg twice a day If you have any seizures please let us know

## 2016-07-26 NOTE — Progress Notes (Signed)
I have read the note, and I agree with the clinical assessment and plan.  WILLIS,CHARLES KEITH   

## 2016-07-26 NOTE — Progress Notes (Signed)
Agreed with Emily's note. We were finally able to start on treatment for hep C. Due to his statins, we are going to use Harvoni for this since it would not be an issue with his zocor.   Onnie Boer, PharmD Pager: [phone] 07/26/2016 11:52 AM

## 2016-07-26 NOTE — Progress Notes (Signed)
HPI: [name] is a 65 y.o. male  who presents to the clinic today for initiation of Hepatitis C treatment. He was previously being treated with Dilantin but was switched to Keppra to avoid drug interactions with Hepatitis C therapy.   Lab Results  Component Value Date   HCVGENOTYPE 1a 03/23/2015    Allergies: No Known Allergies  Vitals: BP: 100/82 (10/05 0853) Pulse Rate: 80 (10/05 0853)  Past Medical History: Past Medical History:  Diagnosis Date  . Alcoholism ([place])    History of, not active  . Dementia   . Gait disorder   . Gait disorder   . 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 History of alcoholism ([place])   . Hx of ischemic vertebrobasilar artery thalamic stroke    Right  . Memory disorder 03/04/2013  . Seizures ([place])   . Stroke [name])    Right frontal, right thalamic  . Ulnar neuropathy of left upper extremity     Social History: Social History   Social History  . Marital status: Divorced    Spouse name: N/A  . Number of children: 0  . Years of education: 10   Social History Main Topics  . Smoking status: Former Smoker    Packs/day: 3.00    Quit date: 01/02/2014  . Smokeless tobacco: Former Systems developer  . Alcohol use No     Comment: Former Alcoholic  . Drug use: No  . Sexual activity: Yes   Other Topics Concern  . Not on file   Social History Narrative   Patient is single and lives with a friend and her son.   Patient does not have any children.   Patient is disabled.   Patient has a 10 th grade education.   Patient is left-handed.   Patient drinks some soda but not everyday.    Labs: Hepatitis B Surface Ag (no units)  Date Value  03/02/2015 NEGATIVE   HCV Ab (no units)  Date Value  03/02/2015 REACTIVE (A)    Lab Results  Component Value Date   HCVGENOTYPE 1a 03/23/2015    Hepatitis C RNA quantitative Latest Ref Rng & Units 05/29/2016 03/02/2015 03/02/2015  HCV Quantitative <15 IU/mL 2,401,090(H) [id number]) [id number])  HCV  Quantitative Log <1.18 log 10 6.38(H) 6.53(H) 6.53(H)    AST  Date Value  03/23/2015 21 U/L  02/21/2015 18 IU/L  01/12/2015 19 IU/L   ALT  Date Value  03/23/2015 19 U/L  02/21/2015 18 IU/L  01/12/2015 21 IU/L   INR (no units)  Date Value  03/23/2015 1.08  12/25/2013 0.98  11/26/2013 1.09     Fibrosis Score: F3/F4 as assessed by ultrasound hepatic elastography.  Child-Pugh Score: A   Assessment: [name] presents to the clinic today with his sister, [name], to discuss starting treatment for Hepatitis C. We discussed Harvoni with the patient including dosing, side effects, the importance of compliance, and potential drug interactions.   [name] stated that [name] had Medicaid and Medicare for insurance. She was not aware of any Medicare Part D plan. On further investigation, [name] is covered by Medicare Part D and a 3 month prescription for Harvoni was sent to the [facility].   Patient will return 2 weeks after starting Harvoni to ensure adherence and tolerability. Will schedule after patient starts therapy.  Recommendations: Start Harvoni for a 3 month course Call pharmacy if starting any new medications including over-the-counter medications F/U with [facility] pharmacist 2 weeks after starting therapy  Ihor Austin, Pharm.D. PGY1 Pharmacy Resident [phone] 07/26/2016, 11:39 AM

## 2016-07-26 NOTE — Progress Notes (Signed)
PATIENT: [name] DOB: 1951/10/17  REASON FOR VISIT: follow up-history of stroke, seizures HISTORY FROM: patient  HISTORY OF PRESENT ILLNESS: [name] is a 65 year old male with a history of cerebrovascular disease, stroke and seizures. He returns today for follow-up. At the last visit he was weaned off of Dilantin. He reports that he has not been taking this medication. He is currently only on Keppra 1500 mg twice a day. His sister is with him today and reports that he has not had any seizure events. She states that he's been doing well off of Dilantin. His infectious disease doctor plans to start treatment for hepatitis C this week. Patient continues on Namenda for his memory. He lives with his sister. He does require some assistance with ADLs. He does not operate a motor vehicle. He returns today for an evaluation.  HISTORY 04/25/16: [name] is a 65 year old male with a history of cerebrovascular disease and seizures. He returns today for follow-up. At the last visit the patient's Keppra was increased to 1500 mg twice a day. The patient was placed back on 300 mg of Dilantin when he presented to the emergency room in March. At the last office visit he was instructed to wean back down to 100 mg of Dilantin. He has developed hepatitis C and in order for him to receive treatment it has been requested that he not be on Dilantin. We have slowly been trying to wean the patient off Dilantin. Patient reports he has not had any seizure events. His sister [name] is with him today. Patient and his sister report that his memory has remained stable. He is on Namenda. He does not operate a motor vehicle. He lives with his sister. He returns today for an evaluation.  HISTORY 01/25/16: [name] is a 65 year old left-handed black male with a history of cerebrovascular disease and associated seizures. The patient has been on Dilantin, but he has developed hepatitis C, and there has been some effort to get him off  of this medication to a medication that does not go through the liver. The patient is on Keppra, taking 1000 g twice daily. He had a recurrent seizure on 01/18/2016. The patient was on 100 mg of Dilantin daily with the Keppra. In the emergency room, he was placed back on his Dilantin at 300 mg daily. He returns the office today for an evaluation. He has not had any further seizures since March 29. The patient has a gait disorder, he uses a cane for ambulation, he may have falls on occasion. On the day he went to the emergency room, he was having intermittent episodes of confusion that would come and go throughout the day consistent with seizures. The patient has a memory disorder as well. He is on Namenda for this. He returns to the office for an evaluation.   REVIEW OF SYSTEMS: Out of a complete 14 system review of symptoms, the patient complains only of the following symptoms, and all other reviewed systems are negative.  Runny nose  ALLERGIES:  No Known Allergies  HOME MEDICATIONS: Outpatient Medications Prior to Visit  Medication Sig Dispense Refill  . aspirin EC 325 MG EC tablet Take 1 tablet (325 mg total) by mouth 2 (two) times daily after a meal. (Patient taking differently: Take 325 mg by mouth daily. ) 30 tablet 0  . Cholecalciferol (VITAMIN D-3) 1000 UNITS CAPS Take 1,000 Units by mouth daily.     . hydrochlorothiazide (HYDRODIURIL) 25 MG tablet Take 1  tablet (25 mg total) by mouth daily. 90 tablet 3  . ibuprofen (ADVIL,MOTRIN) 200 MG tablet Take 400 mg by mouth 2 (two) times daily as needed for moderate pain.    Marland Kitchen ketoconazole (NIZORAL) 2 % cream Apply 1 application topically daily. 60 g 0  . levETIRAcetam (KEPPRA) 750 MG tablet Take 2 tablets (1,500 mg total) by mouth 2 (two) times daily. 120 tablet 5  . memantine (NAMENDA) 10 MG tablet Take 1 tablet (10 mg total) by mouth 2 (two) times daily. 180 tablet 3  . metoprolol succinate (TOPROL-XL) 25 MG 24 hr tablet Take 1 tablet (25 mg  total) by mouth daily. Needs office visit for refills 90 tablet 2  . simvastatin (ZOCOR) 20 MG tablet Take 0.5 tablets (10 mg total) by mouth every evening. 90 tablet 5   No facility-administered medications prior to visit.     PAST MEDICAL HISTORY: Past Medical History:  Diagnosis Date  . Alcoholism ([place])    History of, not active  . Dementia   . Gait disorder   . Gait disorder   . 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 History of alcoholism ([place])   . Hx of ischemic vertebrobasilar artery thalamic stroke    Right  . Memory disorder 03/04/2013  . Seizures ([place])   . Stroke [facility])    Right frontal, right thalamic  . Ulnar neuropathy of left upper extremity     PAST SURGICAL HISTORY: Past Surgical History:  Procedure Laterality Date  . HIP ARTHROPLASTY Left 11/26/2013   Procedure: LEFT HIP HEMIARTHROPLASTY;  Surgeon: Mcarthur Rossetti, MD;  Location: [place];  Service: Orthopedics;  Laterality: Left;  . WRIST SURGERY Left 95 & 96    FAMILY HISTORY: Family History  Problem Relation Age of Onset  . Pulmonary embolism Mother   . Liver disease Father   . Diabetes Sister   . Hypertension Sister   . Hypertension Sister   . Hypertension Sister   . Seizures Neg Hx     SOCIAL HISTORY: Social History   Social History  . Marital status: Divorced    Spouse name: N/A  . Number of children: 0  . Years of education: 10   Occupational History  . Not on file.   Social History Main Topics  . Smoking status: Former Smoker    Packs/day: 3.00    Quit date: 01/02/2014  . Smokeless tobacco: Former Systems developer  . Alcohol use No     Comment: Former Alcoholic  . Drug use: No  . Sexual activity: Yes   Other Topics Concern  . Not on file   Social History Narrative   Patient is single and lives with a friend and her son.   Patient does not have any children.   Patient is disabled.   Patient has a 10 th grade education.   Patient is left-handed.   Patient drinks some soda  but not everyday.      PHYSICAL EXAM  Vitals:   07/26/16 0853  BP: 100/82  Pulse: 80  Weight: 177 lb 6.4 oz (80.5 kg)  Height: 5\' 9"  (1.753 m)   Body mass index is 26.2 kg/m.  Generalized: Well developed, in no acute distress   Neurological examination  Mentation: Alert oriented to Person and place. Follows all commands speech and language fluent Cranial nerve II-XII: Pupils were equal round reactive to light. Extraocular movements were full, visual field were full on confrontational test. Facial sensation and strength were normal.  Uvula tongue midline. Head turning and shoulder shrug  were normal and symmetric. Motor: The motor testing reveals 5 over 5 strength of all 4 extremities. Good symmetric motor tone is noted throughout.  Sensory: Sensory testing is intact to soft touch on all 4 extremities. No evidence of extinction is noted.  Coordination: Cerebellar testing reveals good finger-nose-finger but difficulty with heel-to-shin bilaterally.  Gait and station: Patient is using a wheelchair today.  Reflexes: Deep tendon reflexes are symmetric and normal bilaterally.   DIAGNOSTIC DATA (LABS, IMAGING, TESTING) - I reviewed patient records, labs, notes, testing and imaging myself where available.  Lab Results  Component Value Date   WBC 5.9 05/23/2016   HGB 13.9 05/23/2016   HCT 41.0 05/23/2016   MCV 94.9 05/23/2016   PLT 165 05/23/2016      Component Value Date/Time   NA 143 05/23/2016 1117   NA 149 (H) 02/21/2015 0910   K 4.1 05/23/2016 1117   CL 108 05/23/2016 1117   CO2 25 05/23/2016 1117   GLUCOSE 95 05/23/2016 1117   BUN 10 05/23/2016 1117   BUN 12 02/21/2015 0910   CREATININE 0.92 05/23/2016 1117   CALCIUM 9.2 05/23/2016 1117   PROT 7.0 03/23/2015 1618   PROT 7.1 02/21/2015 0910   ALBUMIN 3.7 01/18/2016 2115   ALBUMIN 4.4 02/21/2015 0910   AST 21 03/23/2015 1618   ALT 19 03/23/2015 1618   ALKPHOS 150 (H) 03/23/2015 1618   BILITOT 0.4 03/23/2015 1618    BILITOT 0.3 02/21/2015 0910   GFRNONAA 88 05/23/2016 1117   GFRAA >89 05/23/2016 1117   Lab Results  Component Value Date   CHOL 118 (L) 05/29/2016   HDL 47 05/29/2016   LDLCALC 55 05/29/2016   TRIG 79 05/29/2016   CHOLHDL 2.5 05/29/2016   Lab Results  Component Value Date   HGBA1C 5.3 05/23/2016        ASSESSMENT AND PLAN 65 y.o. year old male  has a past medical history of Alcoholism ([place]); Dementia; Gait disorder; Gait disorder; Gingival hypertrophy; History of alcoholism ([place]); ischemic vertebrobasilar artery thalamic stroke; Memory disorder (03/04/2013); Seizures ([place]); Stroke [facility]); and Ulnar neuropathy of left upper extremity. here with:  1. Seizures 2. History of stroke   Overall the patient is doing well. He will continue on Keppra 1500 mg twice a day for seizure prevention. Patient advised that if he has any seizure events he should let us know. We will avoid restarting Dilantin if possible. Patient is amenable to this plan. He will follow-up in 6 months with Dr. Jannifer Franklin.     Ward Givens, MSN, NP-C 07/26/2016, 9:16 AM [facility] [address], [place], [place] [address] [phone]

## 2016-07-26 NOTE — Progress Notes (Deleted)
HPI: [name] is a 65 y.o. male with a PMH significant for cerebrovascular disease, stroke and seizures. He presents to the clinic today to initiate therapy for Hepatitis C.   Allergies: No Known Allergies  Vitals: BP: 100/82 (10/05 0853) Pulse Rate: 80 (10/05 0853)  Past Medical History: Past Medical History:  Diagnosis Date  . Alcoholism ([place])    History of, not active  . Dementia   . Gait disorder   . Gait disorder   . 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 History of alcoholism ([place])   . Hx of ischemic vertebrobasilar artery thalamic stroke    Right  . Memory disorder 03/04/2013  . Seizures ([place])   . Stroke [facility])    Right frontal, right thalamic  . Ulnar neuropathy of left upper extremity     Social History: Social History   Social History  . Marital status: Divorced    Spouse name: N/A  . Number of children: 0  . Years of education: 10   Social History Main Topics  . Smoking status: Former Smoker    Packs/day: 3.00    Quit date: 01/02/2014  . Smokeless tobacco: Former Systems developer  . Alcohol use No     Comment: Former Alcoholic  . Drug use: No  . Sexual activity: Yes   Other Topics Concern  . Not on file   Social History Narrative   Patient is single and lives with a friend and her son.   Patient does not have any children.   Patient is disabled.   Patient has a 10 th grade education.   Patient is left-handed.   Patient drinks some soda but not everyday.    Previous Regimen: ***  Current Regimen: ***  Labs: Hepatitis B Surface Ag (no units)  Date Value  03/02/2015 NEGATIVE   HCV Ab (no units)  Date Value  03/02/2015 REACTIVE (A)    CrCl: CrCl cannot be calculated (Patient's most recent lab result is older than the maximum 21 days allowed.).  Lipids:    Component Value Date/Time   CHOL 118 (L) 05/29/2016 0943   TRIG 79 05/29/2016 0943   HDL 47 05/29/2016 0943   CHOLHDL 2.5 05/29/2016 0943   VLDL 16 05/29/2016 0943   LDLCALC 55 05/29/2016 0943    Assessment: [name] presents to the clinic today with his sister, [name], to discuss starting treatment for Hepatitis C. We discussed Harvoni with the patient including dosing, side effects, the importance of compliance, and potential drug interactions.   [name] stated that [facility] had Medicaid and Medicare for insurance. She was not aware of any Medicare Part D plan. On further investigation, [name] is covered by Medicare Part D and a 3 month prescription for Harvoni was sent to the [facility].   Patient will return 2 weeks after starting Harvoni to ensure adherence and tolerability. Will schedule after patient starts therapy.  Recommendations: Start Harvoni for a 3 month course Call pharmacy if starting any new medications including over-the-counter medications F/U with [facility] pharmacist 2 weeks after starting therapy  Ihor Austin, PharmD PGY1 Pharmacy Resident Pager: [phone] 07/26/2016, 11:18 AM

## 2016-07-27 MED FILL — HARVONI 90-400 MG TABLET: 90-400 | 28 days supply | Qty: 28 | Fill #0

## 2016-07-30 ENCOUNTER — Encounter: Payer: Self-pay | Admitting: Pharmacy Technician

## 2016-08-07 ENCOUNTER — Other Ambulatory Visit: Payer: Self-pay | Admitting: Neurology

## 2016-08-15 ENCOUNTER — Ambulatory Visit: Payer: Medicare Other

## 2016-08-20 ENCOUNTER — Telehealth: Payer: Self-pay | Admitting: Pharmacy Technician

## 2016-08-20 MED FILL — HARVONI 90-400 MG TABLET: 90-400 | 28 days supply | Qty: 28 | Fill #1

## 2016-08-20 NOTE — Telephone Encounter (Signed)
[name] sister called.  Stated she is his transportation and cannot bring him to his appt. or to get his Hep C med.  She told him to stop the med & that she would have the doctors restart later. told why that would not work, offered meds to be mailed

## 2016-09-17 MED FILL — HARVONI 90-400 MG TABLET: 90-400 | 28 days supply | Qty: 28 | Fill #2

## 2017-01-07 ENCOUNTER — Other Ambulatory Visit: Payer: Self-pay | Admitting: Neurology

## 2017-01-16 ENCOUNTER — Other Ambulatory Visit: Payer: Self-pay | Admitting: Neurology

## 2017-01-25 ENCOUNTER — Ambulatory Visit (INDEPENDENT_AMBULATORY_CARE_PROVIDER_SITE_OTHER): Payer: Medicare Other | Admitting: Neurology

## 2017-01-25 ENCOUNTER — Encounter: Payer: Self-pay | Admitting: Neurology

## 2017-01-25 ENCOUNTER — Encounter (INDEPENDENT_AMBULATORY_CARE_PROVIDER_SITE_OTHER): Payer: Self-pay

## 2017-01-25 VITALS — BP 119/67 | HR 74 | Ht 69.0 in | Wt 172.5 lb

## 2017-01-25 DIAGNOSIS — R569 Unspecified convulsions: Secondary | ICD-10-CM

## 2017-01-25 DIAGNOSIS — R269 Unspecified abnormalities of gait and mobility: Secondary | ICD-10-CM | POA: Diagnosis not present

## 2017-01-25 DIAGNOSIS — I69398 Other sequelae of cerebral infarction: Secondary | ICD-10-CM | POA: Diagnosis not present

## 2017-01-25 DIAGNOSIS — I69359 Hemiplegia and hemiparesis following cerebral infarction affecting unspecified side: Secondary | ICD-10-CM

## 2017-01-25 HISTORY — DX: Hemiplegia and hemiparesis following cerebral infarction affecting unspecified side: I69.359

## 2017-01-25 NOTE — Progress Notes (Signed)
Reason for visit: Seizures  [name] is an 66 y.o. male  History of present illness:  [name] is a 66 year old left-handed black male with a history of cerebrovascular disease with a left hemiparesis and history of seizures. The patient has been on Keppra taking 1500 mg twice daily, he claims that he is tolerating the medication well, he is not having too much drowsiness or irritability. He does not operate a motor vehicle. He has not had any seizures since last seen. He returns for an evaluation. He does have gait instability, he walks with a walker, he has not had any falls since last seen.  Past Medical History:  Diagnosis Date  . Alcoholism ([place])    History of, not active  . Dementia   . Gait disorder   . Gait disorder   . 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 History of alcoholism ([place])   . Hx of ischemic vertebrobasilar artery thalamic stroke    Right  . Memory disorder 03/04/2013  . Seizures ([place])   . Stroke [facility])    Right frontal, right thalamic  . Ulnar neuropathy of left upper extremity     Past Surgical History:  Procedure Laterality Date  . HIP ARTHROPLASTY Left 11/26/2013   Procedure: LEFT HIP HEMIARTHROPLASTY;  Surgeon: Mcarthur Rossetti, MD;  Location: [place];  Service: Orthopedics;  Laterality: Left;  . WRIST SURGERY Left 95 & 96    Family History  Problem Relation Age of Onset  . Pulmonary embolism Mother   . Liver disease Father   . Diabetes Sister   . Hypertension Sister   . Hypertension Sister   . Hypertension Sister   . Seizures Neg Hx     Social history:  reports that he quit smoking about 3 years ago. He smoked 3.00 packs per day. He has quit using smokeless tobacco. He reports that he does not drink alcohol or use drugs.   No Known Allergies  Medications:  Prior to Admission medications   Medication Sig Start Date End Date Taking? Authorizing Provider  aspirin EC 325 MG EC tablet Take 1 tablet (325 mg total) by  mouth 2 (two) times daily after a meal. Patient taking differently: Take 325 mg by mouth daily.  11/28/13  Yes Mcarthur Rossetti, MD  Cholecalciferol (VITAMIN D-3) 1000 UNITS CAPS Take 1,000 Units by mouth daily.    Yes Historical Provider, MD  hydrochlorothiazide (HYDRODIURIL) 25 MG tablet Take 1 tablet (25 mg total) by mouth daily. 05/23/16  Yes Maren Reamer, MD  ibuprofen (ADVIL,MOTRIN) 200 MG tablet Take 400 mg by mouth 2 (two) times daily as needed for moderate pain.   Yes Historical Provider, MD  Ledipasvir-Sofosbuvir (HARVONI) 90-400 MG TABS Take 1 tablet by mouth daily. 07/26/16  Yes Thayer Headings, MD  levETIRAcetam (KEPPRA) 750 MG tablet TAKE 2 TABLETS (1,500 MG TOTAL) BY MOUTH 2 (TWO) TIMES DAILY. 01/07/17  Yes Kathrynn Ducking, MD  memantine (NAMENDA) 10 MG tablet Take 1 tablet (10 mg total) by mouth 2 (two) times daily. 04/11/16  Yes Kathrynn Ducking, MD  metoprolol succinate (TOPROL-XL) 25 MG 24 hr tablet Take 1 tablet (25 mg total) by mouth daily. Needs office visit for refills 05/23/16  Yes Maren Reamer, MD  simvastatin (ZOCOR) 20 MG tablet Take 0.5 tablets (10 mg total) by mouth every evening. 05/30/16  Yes Maren Reamer, MD    ROS:  Out of a complete 14  system review of symptoms, the patient complains only of the following symptoms, and all other reviewed systems are negative.   Gait disorder  Blood pressure 119/67, pulse 74, height 5\' 9"  (1.753 m), weight 172 lb 8 oz (78.2 kg).  Physical Exam  General: The patient is alert and cooperative at the time of the examination.  Skin: No significant peripheral edema is noted.   Neurologic Exam  Mental status: The patient is alert and oriented x 3 at the time of the examination. The patient has apparent normal recent and remote memory, with an apparently normal attention span and concentration ability.   Cranial nerves: Facial symmetry is present. Speech is normal, no aphasia or dysarthria is noted. Extraocular  movements are full. Visual fields are full.  Motor: The patient has good strength in the right extremities. There is 4+/5 strength with the left arm, near normal strength with the left leg.  Sensory examination: Soft touch sensation is symmetric on the face, but is decreased on the left arm and left leg.  Coordination: The patient has good finger-nose-finger and heel-to-shin on the right, the patient has some ataxia with finger-nose-finger on the left, mild ataxia with the left leg with heel-to-shin.  Gait and station: The patient has a wide-based, circumduction type gait with the left leg, the patient walks with a walker. Romberg is negative, can gait was not attempted.  Reflexes: Deep tendon reflexes are symmetric, with exception that the left biceps reflex was slightly elevated relative to the right.   Assessment/Plan:  1. History of seizures  2. Cerebrovascular disease, left hemiparesis  3. Gait disorder   The patient is doing well with seizure control this time, he will continue the current dose of Keppra, he will follow-up in one year, sooner if needed.  Jill Alexanders MD 01/25/2017 10:26 AM  [facility] [address] [place], [place] [address]  Phone [phone] Fax [phone]

## 2017-02-27 ENCOUNTER — Other Ambulatory Visit: Payer: Self-pay | Admitting: Internal Medicine

## 2017-03-07 ENCOUNTER — Encounter: Payer: Self-pay | Admitting: Internal Medicine

## 2017-03-08 ENCOUNTER — Encounter: Payer: Self-pay | Admitting: Internal Medicine

## 2017-03-11 ENCOUNTER — Encounter: Payer: Self-pay | Admitting: Internal Medicine

## 2017-03-12 ENCOUNTER — Ambulatory Visit: Payer: Medicare Other | Admitting: Internal Medicine

## 2017-03-13 ENCOUNTER — Encounter: Payer: Self-pay | Admitting: Internal Medicine

## 2017-03-13 ENCOUNTER — Ambulatory Visit: Payer: Medicare Other | Attending: Internal Medicine | Admitting: Internal Medicine

## 2017-03-13 VITALS — BP 133/74 | HR 78 | Temp 98.1°F | Resp 16 | Wt 163.6 lb

## 2017-03-13 DIAGNOSIS — R569 Unspecified convulsions: Secondary | ICD-10-CM | POA: Diagnosis not present

## 2017-03-13 DIAGNOSIS — I69398 Other sequelae of cerebral infarction: Secondary | ICD-10-CM | POA: Diagnosis not present

## 2017-03-13 DIAGNOSIS — Z1211 Encounter for screening for malignant neoplasm of colon: Secondary | ICD-10-CM

## 2017-03-13 DIAGNOSIS — K7460 Unspecified cirrhosis of liver: Secondary | ICD-10-CM | POA: Insufficient documentation

## 2017-03-13 DIAGNOSIS — R413 Other amnesia: Secondary | ICD-10-CM | POA: Diagnosis not present

## 2017-03-13 DIAGNOSIS — E559 Vitamin D deficiency, unspecified: Secondary | ICD-10-CM | POA: Diagnosis not present

## 2017-03-13 DIAGNOSIS — R269 Unspecified abnormalities of gait and mobility: Secondary | ICD-10-CM | POA: Diagnosis not present

## 2017-03-13 DIAGNOSIS — Z87891 Personal history of nicotine dependence: Secondary | ICD-10-CM | POA: Insufficient documentation

## 2017-03-13 DIAGNOSIS — B182 Chronic viral hepatitis C: Secondary | ICD-10-CM | POA: Diagnosis not present

## 2017-03-13 DIAGNOSIS — I10 Essential (primary) hypertension: Secondary | ICD-10-CM | POA: Diagnosis not present

## 2017-03-13 DIAGNOSIS — R350 Frequency of micturition: Secondary | ICD-10-CM | POA: Diagnosis not present

## 2017-03-13 DIAGNOSIS — I69359 Hemiplegia and hemiparesis following cerebral infarction affecting unspecified side: Secondary | ICD-10-CM | POA: Diagnosis not present

## 2017-03-13 DIAGNOSIS — F0390 Unspecified dementia without behavioral disturbance: Secondary | ICD-10-CM | POA: Insufficient documentation

## 2017-03-13 DIAGNOSIS — F1021 Alcohol dependence, in remission: Secondary | ICD-10-CM | POA: Insufficient documentation

## 2017-03-13 DIAGNOSIS — E785 Hyperlipidemia, unspecified: Secondary | ICD-10-CM

## 2017-03-13 DIAGNOSIS — Z7982 Long term (current) use of aspirin: Secondary | ICD-10-CM | POA: Insufficient documentation

## 2017-03-13 MED ORDER — SIMVASTATIN 20 MG PO TABS: 10.0000 mg | ORAL_TABLET | Freq: Every evening | ORAL | 5 refills | Status: DC

## 2017-03-13 MED ORDER — MEMANTINE HCL 10 MG PO TABS: 10.0000 mg | ORAL_TABLET | Freq: Two times a day (BID) | ORAL | 3 refills | Status: DC

## 2017-03-13 MED ORDER — HYDROCHLOROTHIAZIDE 25 MG PO TABS: 25.0000 mg | ORAL_TABLET | Freq: Every day | ORAL | 3 refills | Status: DC

## 2017-03-13 MED ORDER — ASPIRIN 325 MG PO TBEC: 325.0000 mg | DELAYED_RELEASE_TABLET | Freq: Every day | ORAL | 3 refills | Status: DC

## 2017-03-13 MED ORDER — ASPIRIN 325 MG PO TBEC: 325.0000 mg | DELAYED_RELEASE_TABLET | Freq: Two times a day (BID) | ORAL | 0 refills | Status: DC

## 2017-03-13 MED ORDER — HYDROCHLOROTHIAZIDE 25 MG PO TABS
25.0000 mg | ORAL_TABLET | Freq: Every day | ORAL | 3 refills | Status: DC
Start: 2017-03-13 — End: 2017-03-13

## 2017-03-13 MED ORDER — METOPROLOL SUCCINATE ER 25 MG PO TB24: 25.0000 mg | ORAL_TABLET | Freq: Every day | ORAL | 2 refills | Status: DC

## 2017-03-13 MED ORDER — LEVETIRACETAM 750 MG PO TABS: 1500.0000 mg | ORAL_TABLET | Freq: Two times a day (BID) | ORAL | 5 refills | Status: DC

## 2017-03-13 NOTE — Progress Notes (Signed)
[name], is a 66 y.o. male  PTW:[id number]  ZGY:[id number]  DOB - April 24, 1951  Chief Complaint  Patient presents with  . Hypertension        Subjective:   [name] is a 66 y.o. male here today for a follow up visit, last seen 05/23/16, w/ pmhx  pmhx chronic hep c cirrhosis (was started on Harvoni, did not complete treatment and has not f/u w/ ID since 10/17 due to family issues), old cva with residual left sided gaid d/o, uses cane, sz d/o, htn, memory deficits (on Namenda), hx of alcoholism. Per pt, currently not smoking and drinking. No c/o, doing well overall.  Patient has No headache, No chest pain, No abdominal pain - No Nausea, No new weakness tingling or numbness, No Cough - SOB.  He is here w/ his sister. She notes that she will call try to call ID to get him f/u appt.  No problems updated.  ALLERGIES: No Known Allergies  PAST MEDICAL HISTORY: Past Medical History:  Diagnosis Date  . Alcoholism ([place])    History of, not active  . Dementia   . Gait disorder   . Gait disorder   . 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 Hemiparesis and alteration of sensations as late effects of stroke ([place]) 01/25/2017   Left hemiparesis  . History of alcoholism ([place])   . Hx of ischemic vertebrobasilar artery thalamic stroke    Right  . Memory disorder 03/04/2013  . Seizures ([place])   . Stroke [facility])    Right frontal, right thalamic  . Ulnar neuropathy of left upper extremity     MEDICATIONS AT HOME: Prior to Admission medications   Medication Sig Start Date End Date Taking? Authorizing Provider  aspirin 325 MG EC tablet Take 1 tablet (325 mg total) by mouth daily. 03/13/17   Maren Reamer, MD  Cholecalciferol (VITAMIN D-3) 1000 UNITS CAPS Take 1,000 Units by mouth daily.     [provider]  hydrochlorothiazide (HYDRODIURIL) 25 MG tablet Take 1 tablet (25 mg total) by mouth daily. 03/13/17   Maren Reamer, MD  ibuprofen (ADVIL,MOTRIN) 200 MG tablet  Take 400 mg by mouth 2 (two) times daily as needed for moderate pain.    [provider]  Ledipasvir-Sofosbuvir (HARVONI) 90-400 MG TABS Take 1 tablet by mouth daily. 07/26/16   Thayer Headings, MD  levETIRAcetam (KEPPRA) 750 MG tablet Take 2 tablets (1,500 mg total) by mouth 2 (two) times daily. 03/13/17   Maren Reamer, MD  memantine (NAMENDA) 10 MG tablet Take 1 tablet (10 mg total) by mouth 2 (two) times daily. 03/13/17   Maren Reamer, MD  metoprolol succinate (TOPROL-XL) 25 MG 24 hr tablet Take 1 tablet (25 mg total) by mouth daily. Needs office visit for refills 03/13/17   Lottie Mussel T, MD  simvastatin (ZOCOR) 20 MG tablet Take 0.5 tablets (10 mg total) by mouth every evening. 03/13/17   Lottie Mussel T, MD     Objective:   Vitals:   03/13/17 0917  BP: 133/74  Pulse: 78  Resp: 16  Temp: 98.1 F (36.7 C)  TempSrc: Oral  SpO2: 97%  Weight: 163 lb 9.6 oz (74.2 kg)    Exam General appearance : Awake, alert, not in any distress. Speech Clear. Not toxic looking, pleasant. Walking w/ cane.  HEENT: Atraumatic and Normocephalic, pupils equally reactive to light. Neck: supple, no JVD.   Chest:Good air entry bilaterally, no added  sounds. CVS: S1 S2 regular, no murmurs/gallups or rubs. Abdomen: Bowel sounds active, Non tender and not distended with no gaurding, rigidity or rebound. Extremities: B/L Lower Ext shows no edema, both legs are warm to touch Neurology: Awake alert, and oriented X 3, CN II-XII grossly intact, Non focal Skin:No Rash  Data Review Lab Results  Component Value Date   HGBA1C 5.3 05/23/2016    Depression screen [facility] 2/9 03/13/2017 05/23/2016 09/22/2015 04/27/2015 02/23/2015  Decreased Interest 0 0 0 0 0  Down, Depressed, Hopeless 0 0 0 0 0  PHQ - 2 Score 0 0 0 0 0      Assessment & Plan   1. Essential hypertension - well controlled - low salt diet recd - cont toprol xl 25 qd and hctz 25 qd - Basic metabolic panel - CBC with  Differential  2. Chronic hepatitis C without hepatic coma ([place]) Urged pt to fu w/ Dr Linus Salmons, sister thinks pt did not finish the 3 month treatment w/ Harvoni  3. Hemiparesis and alteration of sensations as late effects of stroke ([place]) Doing well overall and ambulating w/ walker, pt not currently getting PT. - asa 325 qd encouraged, statin daily  4. Memory disorder Per sister, memory improved greatly w/ Namenda  5. Seizure ([place]) Per neuro, on keppra 1500 bid, no sz for while  6. Abnormality of gait See #3  7. Dyslipidemia - Lipid Panel - simvastatin (ZOCOR) 20 MG tablet; Take 0.5 tablets (10 mg total) by mouth every evening.  Dispense: 90 tablet; Refill: 5  8. Colon cancer screening - Ambulatory referral to Gastroenterology - has not had screening prior, recd screening  9. Urinary frequency, intermittent, but denies urinary obstruction and problems overall - prostate cancer screening - PSA  10. Vitamin D deficiency screening - at risk for vit d def. - VITAMIN D 25 Hydroxy (Vit-D Deficiency, Fractures)     Patient have been counseled extensively about nutrition and exercise  Return in about 3 months (around 06/13/2017).  The patient was given clear instructions to go to ER or return to medical center if symptoms don't improve, worsen or new problems develop. The patient verbalized understanding. The patient was told to call to get lab results if they haven't heard anything in the next week.   This note has been created with Surveyor, quantity. Any transcriptional errors are unintentional.   Maren Reamer, MD, [place] [facility] [place], [place]   03/13/2017, 12:17 PM

## 2017-03-13 NOTE — Patient Instructions (Signed)
- Please call Dr Linus Salmons _ infectious disease doctor to follow up with hepatitis C treatment.  -  Low-Sodium Eating Plan Sodium, which is an element that makes up salt, helps you maintain a healthy balance of fluids in your body. Too much sodium can increase your blood pressure and cause fluid and waste to be held in your body. Your health care provider or dietitian may recommend following this plan if you have high blood pressure (hypertension), kidney disease, liver disease, or heart failure. Eating less sodium can help lower your blood pressure, reduce swelling, and protect your heart, liver, and kidneys. What are tips for following this plan? General guidelines   Most people on this plan should limit their sodium intake to 1,500-2,000 mg (milligrams) of sodium each day. Reading food labels   The Nutrition Facts label lists the amount of sodium in one serving of the food. If you eat more than one serving, you must multiply the listed amount of sodium by the number of servings.  Choose foods with less than 140 mg of sodium per serving.  Avoid foods with 300 mg of sodium or more per serving. Shopping   Look for lower-sodium products, often labeled as "low-sodium" or "no salt added."  Always check the sodium content even if foods are labeled as "unsalted" or "no salt added".  Buy fresh foods.  Avoid canned foods and premade or frozen meals.  Avoid canned, cured, or processed meats  Buy breads that have less than 80 mg of sodium per slice. Cooking   Eat more home-cooked food and less restaurant, buffet, and fast food.  Avoid adding salt when cooking. Use salt-free seasonings or herbs instead of table salt or sea salt. Check with your health care provider or pharmacist before using salt substitutes.  Cook with plant-based oils, such as canola, sunflower, or olive oil. Meal planning   When eating at a restaurant, ask that your food be prepared with less salt or no salt, if  possible.  Avoid foods that contain MSG (monosodium glutamate). MSG is sometimes added to [place] food, bouillon, and some canned foods. What foods are recommended? The items listed may not be a complete list. Talk with your dietitian about what dietary choices are best for you. Grains  Low-sodium cereals, including oats, puffed wheat and rice, and shredded wheat. Low-sodium crackers. Unsalted rice. Unsalted pasta. Low-sodium bread. Whole-grain breads and whole-grain pasta. Vegetables  Fresh or frozen vegetables. "No salt added" canned vegetables. "No salt added" tomato sauce and paste. Low-sodium or reduced-sodium tomato and vegetable juice. Fruits  Fresh, frozen, or canned fruit. Fruit juice. Meats and other protein foods  Fresh or frozen (no salt added) meat, poultry, seafood, and fish. Low-sodium canned tuna and salmon. Unsalted nuts. Dried peas, beans, and lentils without added salt. Unsalted canned beans. Eggs. Unsalted nut butters. Dairy  Milk. Soy milk. Cheese that is naturally low in sodium, such as ricotta cheese, fresh mozzarella, or Swiss cheese Low-sodium or reduced-sodium cheese. Cream cheese. Yogurt. Fats and oils  Unsalted butter. Unsalted margarine with no trans fat. Vegetable oils such as canola or olive oils. Seasonings and other foods  Fresh and dried herbs and spices. Salt-free seasonings. Low-sodium mustard and ketchup. Sodium-free salad dressing. Sodium-free light mayonnaise. Fresh or refrigerated horseradish. Lemon juice. Vinegar. Homemade, reduced-sodium, or low-sodium soups. Unsalted popcorn and pretzels. Low-salt or salt-free chips. What foods are not recommended? The items listed may not be a complete list. Talk with your dietitian about what dietary choices are  best for you. Grains  Instant hot cereals. Bread stuffing, pancake, and biscuit mixes. Croutons. Seasoned rice or pasta mixes. Noodle soup cups. Boxed or frozen macaroni and cheese. Regular salted crackers.  Self-rising flour. Vegetables  Sauerkraut, pickled vegetables, and relishes. Olives. [place] fries. Onion rings. Regular canned vegetables (not low-sodium or reduced-sodium). Regular canned tomato sauce and paste (not low-sodium or reduced-sodium). Regular tomato and vegetable juice (not low-sodium or reduced-sodium). Frozen vegetables in sauces. Meats and other protein foods  Meat or fish that is salted, canned, smoked, spiced, or pickled. Bacon, ham, sausage, hotdogs, corned beef, chipped beef, packaged lunch meats, salt pork, jerky, pickled herring, anchovies, regular canned tuna, sardines, salted nuts. Dairy  Processed cheese and cheese spreads. Cheese curds. Blue cheese. Feta cheese. String cheese. Regular cottage cheese. Buttermilk. Canned milk. Fats and oils  Salted butter. Regular margarine. Ghee. Bacon fat. Seasonings and other foods  Onion salt, garlic salt, seasoned salt, table salt, and sea salt. Canned and packaged gravies. Worcestershire sauce. Tartar sauce. Barbecue sauce. Teriyaki sauce. Soy sauce, including reduced-sodium. Steak sauce. Fish sauce. Oyster sauce. Cocktail sauce. Horseradish that you find on the shelf. Regular ketchup and mustard. Meat flavorings and tenderizers. Bouillon cubes. Hot sauce and Tabasco sauce. Premade or packaged marinades. Premade or packaged taco seasonings. Relishes. Regular salad dressings. Salsa. Potato and tortilla chips. Corn chips and puffs. Salted popcorn and pretzels. Canned or dried soups. Pizza. Frozen entrees and pot pies. Summary  Eating less sodium can help lower your blood pressure, reduce swelling, and protect your heart, liver, and kidneys.  Most people on this plan should limit their sodium intake to 1,500-2,000 mg (milligrams) of sodium each day.  Canned, boxed, and frozen foods are high in sodium. Restaurant foods, fast foods, and pizza are also very high in sodium. You also get sodium by adding salt to food.  Try to cook at home,  eat more fresh fruits and vegetables, and eat less fast food, canned, processed, or prepared foods. This information is not intended to replace advice given to you by your health care provider. Make sure you discuss any questions you have with your health care provider. Document Released: 03/30/2002 Document Revised: 10/01/2016 Document Reviewed: 10/01/2016 Elsevier Interactive Patient Education  2017 [place].  -  Colonoscopy, Adult A colonoscopy is an exam to look at the large intestine. It is done to check for problems, such as:  Lumps (tumors).  Growths (polyps).  Swelling (inflammation).  Bleeding. What happens before the procedure? Eating and drinking  Follow instructions from your doctor about eating and drinking. These instructions may include:  A few days before the procedure - follow a low-fiber diet.  Avoid nuts.  Avoid seeds.  Avoid dried fruit.  Avoid raw fruits.  Avoid vegetables.  1-3 days before the procedure - follow a clear liquid diet. Avoid liquids that have red or purple dye. Drink only clear liquids, such as:  Clear broth or bouillon.  Black coffee or tea.  Clear juice.  Clear soft drinks or sports drinks.  [place] dessert.  Popsicles.  On the day of the procedure - do not eat or drink anything during the 2 hours before the procedure. Bowel prep  If you were prescribed an oral bowel prep:  Take it as told by your doctor. Starting the day before your procedure, you will need to drink a lot of liquid. The liquid will cause you to poop (have bowel movements) until your poop is almost clear or light green.  If  your skin or butt gets irritated from diarrhea, you may:  Wipe the area with wipes that have medicine in them, such as adult wet wipes with aloe and vitamin E.  Put something on your skin that soothes the area, such as petroleum jelly.  If you throw up (vomit) while drinking the bowel prep, take a break for up to 60 minutes. Then  begin the bowel prep again. If you keep throwing up and you cannot take the bowel prep without throwing up, call your doctor. General instructions   Ask your doctor about changing or stopping your normal medicines. This is important if you take diabetes medicines or blood thinners.  Plan to have someone take you home from the hospital or clinic. What happens during the procedure?  An IV tube may be put into one of your veins.  You will be given medicine to help you relax (sedative).  To reduce your risk of infection:  Your doctors will wash their hands.  Your anal area will be washed with soap.  You will be asked to lie on your side with your knees bent.  Your doctor will get a long, thin, flexible tube ready. The tube will have a camera and a light on the end.  The tube will be put into your anus.  The tube will be gently put into your large intestine.  Air will be delivered into your large intestine to keep it open. You may feel some pressure or cramping.  The camera will be used to take photos.  A small tissue sample may be removed from your body to be looked at under a microscope (biopsy). If any possible problems are found, the tissue will be sent to a lab for testing.  If small growths are found, your doctor may remove them and have them checked for cancer.  The tube that was put into your anus will be slowly removed. The procedure may vary among doctors and hospitals. What happens after the procedure?  Your doctor will check on you often until the medicines you were given have worn off.  Do not drive for 24 hours after the procedure.  You may have a small amount of blood in your poop.  You may pass gas.  You may have mild cramps or bloating in your belly (abdomen).  It is up to you to get the results of your procedure. Ask your doctor, or the department performing the procedure, when your results will be ready. This information is not intended to replace advice  given to you by your health care provider. Make sure you discuss any questions you have with your health care provider. Document Released: 11/10/2010 Document Revised: 08/08/2016 Document Reviewed: 12/20/2015 Elsevier Interactive Patient Education  2017 [place] Maintenance, Male A healthy lifestyle and preventive care is important for your health and wellness. Ask your health care provider about what schedule of regular examinations is right for you. What should I know about weight and diet?  Eat a Healthy Diet  Eat plenty of vegetables, fruits, whole grains, low-fat dairy products, and lean protein.  Do not eat a lot of foods high in solid fats, added sugars, or salt. Maintain a Healthy Weight  Regular exercise can help you achieve or maintain a healthy weight. You should:  Do at least 150 minutes of exercise each week. The exercise should increase your heart rate and make you sweat (moderate-intensity exercise).  Do strength-training exercises at least twice a week.  Watch Your Levels of Cholesterol and Blood Lipids  Have your blood tested for lipids and cholesterol every 5 years starting at 64 years of age. If you are at high risk for heart disease, you should start having your blood tested when you are 64 years old. You may need to have your cholesterol levels checked more often if:  Your lipid or cholesterol levels are high.  You are older than 64 years of age.  You are at high risk for heart disease. What should I know about cancer screening? Many types of cancers can be detected early and may often be prevented. Lung Cancer  You should be screened every year for lung cancer if:  You are a current smoker who has smoked for at least 30 years.  You are a former smoker who has quit within the past 15 years.  Talk to your health care provider about your screening options, when you should start screening, and how often you should be screened. Colorectal  Cancer  Routine colorectal cancer screening usually begins at 64 years of age and should be repeated every 5-10 years until you are 64 years old. You may need to be screened more often if early forms of precancerous polyps or small growths are found. Your health care provider may recommend screening at an earlier age if you have risk factors for colon cancer.  Your health care provider may recommend using home test kits to check for hidden blood in the stool.  A small camera at the end of a tube can be used to examine your colon (sigmoidoscopy or colonoscopy). This checks for the earliest forms of colorectal cancer. Prostate and Testicular Cancer  Depending on your age and overall health, your health care provider may do certain tests to screen for prostate and testicular cancer.  Talk to your health care provider about any symptoms or concerns you have about testicular or prostate cancer. Skin Cancer  Check your skin from head to toe regularly.  Tell your health care provider about any new moles or changes in moles, especially if:  There is a change in a mole's size, shape, or color.  You have a mole that is larger than a pencil eraser.  Always use sunscreen. Apply sunscreen liberally and repeat throughout the day.  Protect yourself by wearing long sleeves, pants, a wide-brimmed hat, and sunglasses when outside. What should I know about heart disease, diabetes, and high blood pressure?  If you are 74-3 years of age, have your blood pressure checked every 3-5 years. If you are 55 years of age or older, have your blood pressure checked every year. You should have your blood pressure measured twice-once when you are at a hospital or clinic, and once when you are not at a hospital or clinic. Record the average of the two measurements. To check your blood pressure when you are not at a hospital or clinic, you can use:  An automated blood pressure machine at a pharmacy.  A home blood  pressure monitor.  Talk to your health care provider about your target blood pressure.  If you are between 71-75 years old, ask your health care provider if you should take aspirin to prevent heart disease.  Have regular diabetes screenings by checking your fasting blood sugar level.  If you are at a normal weight and have a low risk for diabetes, have this test once every three years after the age of 2.  If you are overweight and have a  high risk for diabetes, consider being tested at a younger age or more often.  A one-time screening for abdominal aortic aneurysm (AAA) by ultrasound is recommended for men aged 41-75 years who are current or former smokers. What should I know about preventing infection? Hepatitis B  If you have a higher risk for hepatitis B, you should be screened for this virus. Talk with your health care provider to find out if you are at risk for hepatitis B infection. Hepatitis C  Blood testing is recommended for:  Everyone born from [birth year] through 1963.  Anyone with known risk factors for hepatitis C. Sexually Transmitted Diseases (STDs)  You should be screened each year for STDs including gonorrhea and chlamydia if:  You are sexually active and are younger than 64 years of age.  You are older than 64 years of age and your health care provider tells you that you are at risk for this type of infection.  Your sexual activity has changed since you were last screened and you are at an increased risk for chlamydia or gonorrhea. Ask your health care provider if you are at risk.  Talk with your health care provider about whether you are at high risk of being infected with HIV. Your health care provider may recommend a prescription medicine to help prevent HIV infection. What else can I do?  Schedule regular health, dental, and eye exams.  Stay current with your vaccines (immunizations).  Do not use any tobacco products, such as cigarettes, chewing tobacco, and  e-cigarettes. If you need help quitting, ask your health care provider.  Limit alcohol intake to no more than 2 drinks per day. One drink equals 12 ounces of beer, 5 ounces of wine, or 1 ounces of hard liquor.  Do not use street drugs.  Do not share needles.  Ask your health care provider for help if you need support or information about quitting drugs.  Tell your health care provider if you often feel depressed.  Tell your health care provider if you have ever been abused or do not feel safe at home. This information is not intended to replace advice given to you by your health care provider. Make sure you discuss any questions you have with your health care provider. Document Released: 04/05/2008 Document Revised: 06/06/2016 Document Reviewed: 07/12/2015 Elsevier Interactive Patient Education  2017 [employer].

## 2017-03-14 LAB — LIPID PANEL
CHOLESTEROL TOTAL: 128 mg/dL (ref 100–199)
Chol/HDL Ratio: 4 ratio (ref 0.0–5.0)
HDL: 32 mg/dL — ABNORMAL LOW (ref >39)
LDL Calculated: 76 mg/dL (ref 0–99)
Triglycerides: 100 mg/dL (ref 0–149)
VLDL CHOLESTEROL CAL: 20 mg/dL (ref 5–40)

## 2017-03-14 LAB — PSA: Prostate Specific Ag, Serum: 1.4 ng/mL (ref 0.0–4.0)

## 2017-03-14 LAB — CBC WITH DIFFERENTIAL/PLATELET
Basophils Absolute: 0 10*3/uL (ref 0.0–0.2)
Basos: 0 %
EOS (ABSOLUTE): 0.1 10*3/uL (ref 0.0–0.4)
EOS: 2 %
HEMATOCRIT: 44.6 % (ref 37.5–51.0)
HEMOGLOBIN: 15 g/dL (ref 13.0–17.7)
IMMATURE GRANS (ABS): 0 10*3/uL (ref 0.0–0.1)
IMMATURE GRANULOCYTES: 0 %
LYMPHS ABS: 1.8 10*3/uL (ref 0.7–3.1)
LYMPHS: 30 %
MCH: 31.6 pg (ref 26.6–33.0)
MCHC: 33.6 g/dL (ref 31.5–35.7)
MCV: 94 fL (ref 79–97)
MONOCYTES: 8 %
Monocytes Absolute: 0.5 10*3/uL (ref 0.1–0.9)
NEUTROS PCT: 60 %
Neutrophils Absolute: 3.6 10*3/uL (ref 1.4–7.0)
Platelets: 180 10*3/uL (ref 150–379)
RBC: 4.75 x10E6/uL (ref 4.14–5.80)
RDW: 13.2 % (ref 12.3–15.4)
WBC: 6 10*3/uL (ref 3.4–10.8)

## 2017-03-14 LAB — BASIC METABOLIC PANEL
BUN / CREAT RATIO: 16 (ref 10–24)
BUN: 18 mg/dL (ref 8–27)
CHLORIDE: 94 mmol/L — AB (ref 96–106)
CO2: 28 mmol/L (ref 18–29)
Calcium: 10.2 mg/dL (ref 8.6–10.2)
Creatinine, Ser: 1.12 mg/dL (ref 0.76–1.27)
GFR calc Af Amer: 80 mL/min/{1.73_m2} (ref >59)
GFR calc non Af Amer: 69 mL/min/{1.73_m2} (ref >59)
GLUCOSE: 110 mg/dL — AB (ref 65–99)
POTASSIUM: 4 mmol/L (ref 3.5–5.2)
Sodium: 138 mmol/L (ref 134–144)

## 2017-03-14 LAB — VITAMIN D 25 HYDROXY (VIT D DEFICIENCY, FRACTURES): VIT D 25 HYDROXY: 71.4 ng/mL (ref 30.0–100.0)

## 2017-03-15 ENCOUNTER — Telehealth: Payer: Self-pay | Admitting: Internal Medicine

## 2017-03-15 MED ORDER — METOPROLOL SUCCINATE ER 25 MG PO TB24: 25.0000 mg | ORAL_TABLET | Freq: Every day | ORAL | 2 refills | Status: DC

## 2017-03-15 NOTE — Telephone Encounter (Signed)
Pt. Called stating that he went to pick up his medication at [facility] and they did not have his metoprolol succinate (TOPROL-XL) 25 MG 24 hr tablet. Please f/u

## 2017-03-15 NOTE — Telephone Encounter (Signed)
[facility]rolol sent to [facility], it had previously been sent to [facility].

## 2017-03-20 ENCOUNTER — Other Ambulatory Visit: Payer: Self-pay | Admitting: Pharmacist

## 2017-03-20 MED ORDER — METOPROLOL SUCCINATE ER 25 MG PO TB24: 25.0000 mg | ORAL_TABLET | Freq: Every day | ORAL | 2 refills | Status: DC

## 2017-03-22 ENCOUNTER — Telehealth: Payer: Self-pay

## 2017-03-22 NOTE — Telephone Encounter (Signed)
Contacted pt to go over lab results spoke with pt sister made aware of results and she doesn't have any questions or concerns

## 2017-03-28 ENCOUNTER — Telehealth: Payer: Self-pay | Admitting: Pharmacist Clinician (PhC)/ Clinical Pharmacy Specialist

## 2017-03-28 NOTE — Telephone Encounter (Signed)
So the triple therapy then I guess and call it a failure, assuming it is still active?  Hopefully they will cover it.

## 2017-03-28 NOTE — Telephone Encounter (Signed)
[name]'s sister called about [name]. We could never bring him back for labs for his hep C after several attempts. Apparently, he only got 1 month of Harvoni. His sister never gave him the other 2 months because she didn't know what to do. Bringing him back for labs then re-treat

## 2017-04-08 ENCOUNTER — Ambulatory Visit (INDEPENDENT_AMBULATORY_CARE_PROVIDER_SITE_OTHER): Payer: Medicare Other | Admitting: Pharmacist Clinician (PhC)/ Clinical Pharmacy Specialist

## 2017-04-08 DIAGNOSIS — B182 Chronic viral hepatitis C: Secondary | ICD-10-CM | POA: Diagnosis not present

## 2017-04-08 MED ORDER — SOFOSBUV-VELPATASV-VOXILAPREV 400-100-100 MG PO TABS: 1.0000 | ORAL_TABLET | Freq: Every day | ORAL | 2 refills | Status: DC

## 2017-04-08 NOTE — Patient Instructions (Signed)
Vosevi 1 tablet daily with food Come back and see me in 1 month

## 2017-04-08 NOTE — Progress Notes (Signed)
HPI: [name] is a 66 y.o. male who has likely failed Harvoni due to incompletion of therapy.   Allergies: No Known Allergies  Vitals:    Past Medical History: Past Medical History:  Diagnosis Date  . Alcoholism ([place])    History of, not active  . Dementia   . Gait disorder   . Gait disorder   . 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 Hemiparesis and alteration of sensations as late effects of stroke ([place]) 01/25/2017   Left hemiparesis  . History of alcoholism ([place])   . Hx of ischemic vertebrobasilar artery thalamic stroke    Right  . Memory disorder 03/04/2013  . Seizures ([place])   . Stroke [facility])    Right frontal, right thalamic  . Ulnar neuropathy of left upper extremity     Social History: Social History   Social History  . Marital status: Divorced    Spouse name: N/A  . Number of children: 0  . Years of education: 10   Social History Main Topics  . Smoking status: Former Smoker    Packs/day: 3.00    Quit date: 01/02/2014  . Smokeless tobacco: Former Systems developer  . Alcohol use No     Comment: Former Alcoholic  . Drug use: No  . Sexual activity: Yes   Other Topics Concern  . Not on file   Social History Narrative   Patient is single and lives with a friend and her son.   Patient does not have any children.   Patient is disabled.   Patient has a 10 th grade education.   Patient is left-handed.   Patient drinks some soda but not everyday.    Previous Regimen: Harvoni  Current Regimen: None  Labs: Hepatitis B Surface Ag (no units)  Date Value  03/02/2015 NEGATIVE   HCV Ab (no units)  Date Value  03/02/2015 REACTIVE (A)    CrCl: CrCl cannot be calculated (Patient's most recent lab result is older than the maximum 21 days allowed.).  Lipids:    Component Value Date/Time   CHOL 128 03/13/2017 1039   TRIG 100 03/13/2017 1039   HDL 32 (L) 03/13/2017 1039   CHOLHDL 4.0 03/13/2017 1039   CHOLHDL 2.5 05/29/2016 0943   VLDL 16 05/29/2016  0943   LDLCALC 76 03/13/2017 1039    Assessment: [name] was started on his Harvoni last year but due to his low mental capacity, his sister only gave him one because she didn't know what to do with the other 2 months. Discussed the case briefly with Dr. Linus Salmons today and we plan to treat him with Vosevi. We are going to bypass the resistance testing today and just get a VL. Counseled his sister [name] extensively about therapy has to be continuous for 3 month. She will be the point person for his care.   Recommendations:  Hep C VL today Vosevi 1 PO qday x 53mo F/u in 1 month  Onnie Boer, PharmD, BCPS, AAHIVP, CPP Clinical [facility] 04/08/2017, 3:17 PM

## 2017-04-09 LAB — COMPLETE METABOLIC PANEL WITH GFR
ALBUMIN: 4.3 g/dL (ref 3.6–5.1)
ALK PHOS: 99 U/L (ref 40–115)
ALT: 9 U/L (ref 9–46)
AST: 13 U/L (ref 10–35)
BUN: 13 mg/dL (ref 7–25)
CHLORIDE: 100 mmol/L (ref 98–110)
CO2: 28 mmol/L (ref 20–31)
Calcium: 9.5 mg/dL (ref 8.6–10.3)
Creat: 0.93 mg/dL (ref 0.70–1.25)
GFR, Est African American: >89 mL/min (ref >=60)
GFR, Est Non African American: 86 mL/min (ref >=60)
GLUCOSE: 84 mg/dL (ref 65–99)
POTASSIUM: 4 mmol/L (ref 3.5–5.3)
SODIUM: 139 mmol/L (ref 135–146)
Total Bilirubin: 0.5 mg/dL (ref 0.2–1.2)
Total Protein: 7.1 g/dL (ref 6.1–8.1)

## 2017-04-11 ENCOUNTER — Telehealth: Payer: Self-pay | Admitting: Pharmacist Clinician (PhC)/ Clinical Pharmacy Specialist

## 2017-04-11 ENCOUNTER — Other Ambulatory Visit: Payer: Self-pay | Admitting: Pharmacist Clinician (PhC)/ Clinical Pharmacy Specialist

## 2017-04-11 LAB — HEPATITIS C RNA QUANTITATIVE
HCV Quantitative Log: <1.18 Log IU/mL
HCV Quantitative: <15 IU/mL

## 2017-04-11 NOTE — Telephone Encounter (Signed)
Called [name] today to let her know that [place] has been cure of his hepatitis C with just 4 wks of therapy. He started Harvoni 07/27/16, which means that he stopped it after 28 days of meds. He will not need to f/u with [place] anymore. Will relay this message to Dr. Linus Salmons.

## 2017-04-15 ENCOUNTER — Other Ambulatory Visit: Payer: Self-pay | Admitting: Gastroenterology

## 2017-04-15 DIAGNOSIS — Z1211 Encounter for screening for malignant neoplasm of colon: Secondary | ICD-10-CM

## 2017-05-01 ENCOUNTER — Ambulatory Visit
Admission: RE | Admit: 2017-05-01 | Discharge: 2017-05-01 | Disposition: A | Discharge status: Home / Self Care | Payer: Medicare Other | Source: Ambulatory Visit | Attending: Gastroenterology | Admitting: Gastroenterology

## 2017-05-01 DIAGNOSIS — K449 Diaphragmatic hernia without obstruction or gangrene: Secondary | ICD-10-CM | POA: Diagnosis not present

## 2017-05-01 DIAGNOSIS — Z1211 Encounter for screening for malignant neoplasm of colon: Secondary | ICD-10-CM

## 2017-05-09 ENCOUNTER — Ambulatory Visit: Payer: Medicare Other

## 2017-05-13 ENCOUNTER — Telehealth: Payer: Self-pay | Admitting: Neurology

## 2017-05-13 ENCOUNTER — Encounter: Payer: Self-pay | Admitting: Neurology

## 2017-05-13 NOTE — Telephone Encounter (Signed)
Pt's sister called the office said he needs to have cataract surgery and his eye Dr advised because he did not understand the questions being asked to him she would need to get guardianship or POA (she is going thru [employer]). Said she needs a letter stating he has dementia or memory problems. Please call

## 2017-05-13 NOTE — Telephone Encounter (Signed)
I called the family. The patient has been followed in part for dementia, he is scoring in the low moderate range of dementia. I will dictate a letter indicating that he is not competent to manage his legal, financial, or medical affairs.

## 2017-05-13 NOTE — Telephone Encounter (Signed)
Called and spoke with [name]. Advised letter ready for pick up. She will try and come tomorrow to pick up letter. Placed letter up front for pick up.

## 2017-05-29 ENCOUNTER — Other Ambulatory Visit: Payer: Self-pay | Admitting: Gastroenterology

## 2017-05-29 DIAGNOSIS — I6930 Unspecified sequelae of cerebral infarction: Secondary | ICD-10-CM | POA: Diagnosis not present

## 2017-05-29 DIAGNOSIS — R933 Abnormal findings on diagnostic imaging of other parts of digestive tract: Secondary | ICD-10-CM | POA: Diagnosis not present

## 2017-05-29 DIAGNOSIS — R938 Abnormal findings on diagnostic imaging of other specified body structures: Secondary | ICD-10-CM | POA: Diagnosis not present

## 2017-05-29 DIAGNOSIS — K7460 Unspecified cirrhosis of liver: Secondary | ICD-10-CM | POA: Diagnosis not present

## 2017-06-06 DIAGNOSIS — B182 Chronic viral hepatitis C: Secondary | ICD-10-CM | POA: Diagnosis not present

## 2017-06-14 ENCOUNTER — Ambulatory Visit: Payer: Medicare Other | Admitting: Internal Medicine

## 2017-06-19 ENCOUNTER — Other Ambulatory Visit: Payer: Self-pay | Admitting: Gastroenterology

## 2017-07-09 NOTE — Progress Notes (Signed)
Unable to reach pt by phone. Left pre-op instructions on [name] number (emergency contact listed).  ECHO - 11/26/13 - EPIC EKG - 01/22/16 - EPIC

## 2017-07-10 ENCOUNTER — Encounter (HOSPITAL_COMMUNITY): Payer: Self-pay | Admitting: *Deleted

## 2017-07-10 ENCOUNTER — Ambulatory Visit (HOSPITAL_COMMUNITY)
Admission: RE | Admit: 2017-07-10 | Discharge: 2017-07-10 | Disposition: A | Discharge status: Home / Self Care | Payer: Medicare Other | Source: Ambulatory Visit | Attending: Gastroenterology | Admitting: Gastroenterology

## 2017-07-10 ENCOUNTER — Encounter (HOSPITAL_COMMUNITY)
Admission: RE | Disposition: A | Discharge status: Home / Self Care | Payer: Self-pay | Source: Ambulatory Visit | Attending: Gastroenterology

## 2017-07-10 ENCOUNTER — Ambulatory Visit (HOSPITAL_COMMUNITY): Payer: Medicare Other | Admitting: Certified Registered Nurse Anesthetist

## 2017-07-10 DIAGNOSIS — Z79899 Other long term (current) drug therapy: Secondary | ICD-10-CM | POA: Insufficient documentation

## 2017-07-10 DIAGNOSIS — F0390 Unspecified dementia without behavioral disturbance: Secondary | ICD-10-CM | POA: Diagnosis not present

## 2017-07-10 DIAGNOSIS — Z7982 Long term (current) use of aspirin: Secondary | ICD-10-CM | POA: Diagnosis not present

## 2017-07-10 DIAGNOSIS — Z87891 Personal history of nicotine dependence: Secondary | ICD-10-CM | POA: Insufficient documentation

## 2017-07-10 DIAGNOSIS — K449 Diaphragmatic hernia without obstruction or gangrene: Secondary | ICD-10-CM | POA: Diagnosis not present

## 2017-07-10 DIAGNOSIS — K228 Other specified diseases of esophagus: Secondary | ICD-10-CM | POA: Diagnosis not present

## 2017-07-10 DIAGNOSIS — D123 Benign neoplasm of transverse colon: Secondary | ICD-10-CM | POA: Diagnosis not present

## 2017-07-10 DIAGNOSIS — K648 Other hemorrhoids: Secondary | ICD-10-CM | POA: Insufficient documentation

## 2017-07-10 DIAGNOSIS — F1020 Alcohol dependence, uncomplicated: Secondary | ICD-10-CM | POA: Diagnosis not present

## 2017-07-10 DIAGNOSIS — K5730 Diverticulosis of large intestine without perforation or abscess without bleeding: Secondary | ICD-10-CM | POA: Diagnosis not present

## 2017-07-10 DIAGNOSIS — Z8673 Personal history of transient ischemic attack (TIA), and cerebral infarction without residual deficits: Secondary | ICD-10-CM | POA: Diagnosis not present

## 2017-07-10 DIAGNOSIS — D125 Benign neoplasm of sigmoid colon: Secondary | ICD-10-CM | POA: Diagnosis not present

## 2017-07-10 DIAGNOSIS — G40909 Epilepsy, unspecified, not intractable, without status epilepticus: Secondary | ICD-10-CM | POA: Insufficient documentation

## 2017-07-10 DIAGNOSIS — K7460 Unspecified cirrhosis of liver: Secondary | ICD-10-CM | POA: Insufficient documentation

## 2017-07-10 DIAGNOSIS — B1920 Unspecified viral hepatitis C without hepatic coma: Secondary | ICD-10-CM | POA: Insufficient documentation

## 2017-07-10 DIAGNOSIS — K208 Other esophagitis: Secondary | ICD-10-CM | POA: Diagnosis not present

## 2017-07-10 DIAGNOSIS — K766 Portal hypertension: Secondary | ICD-10-CM | POA: Diagnosis not present

## 2017-07-10 DIAGNOSIS — R933 Abnormal findings on diagnostic imaging of other parts of digestive tract: Secondary | ICD-10-CM | POA: Diagnosis not present

## 2017-07-10 DIAGNOSIS — K635 Polyp of colon: Secondary | ICD-10-CM | POA: Diagnosis not present

## 2017-07-10 HISTORY — PX: ESOPHAGOGASTRODUODENOSCOPY (EGD) WITH PROPOFOL: SHX5813

## 2017-07-10 HISTORY — PX: COLONOSCOPY WITH PROPOFOL: SHX5780

## 2017-07-10 SURGERY — COLONOSCOPY WITH PROPOFOL
Anesthesia: Monitor Anesthesia Care

## 2017-07-10 MED ORDER — PROPOFOL 500 MG/50ML IV EMUL
INTRAVENOUS | Status: DC | PRN
  Administered 2017-07-10: 13:00:00 via INTRAVENOUS
  Administered 2017-07-10: 100 ug/kg/min via INTRAVENOUS

## 2017-07-10 MED ORDER — LIDOCAINE 2% (20 MG/ML) 5 ML SYRINGE
INTRAMUSCULAR | Status: DC | PRN
  Administered 2017-07-10: 40 mg via INTRAVENOUS

## 2017-07-10 MED ORDER — SODIUM CHLORIDE 0.9 % IV SOLN: INTRAVENOUS | Status: DC

## 2017-07-10 MED ORDER — LACTATED RINGERS IV SOLN
INTRAVENOUS | Status: AC | PRN
  Administered 2017-07-10: 1000 mL via INTRAVENOUS

## 2017-07-10 MED ORDER — ONDANSETRON HCL 4 MG/2ML IJ SOLN
INTRAMUSCULAR | Status: DC | PRN
  Administered 2017-07-10: 4 mg via INTRAVENOUS

## 2017-07-10 MED ORDER — PROPOFOL 10 MG/ML IV BOLUS
INTRAVENOUS | Status: DC | PRN
  Administered 2017-07-10: 10 mg via INTRAVENOUS

## 2017-07-10 MED ORDER — GLYCOPYRROLATE 0.2 MG/ML IV SOSY
PREFILLED_SYRINGE | INTRAVENOUS | Status: DC | PRN
  Administered 2017-07-10: 0.4 mg via INTRAVENOUS

## 2017-07-10 MED ORDER — PHENYLEPHRINE HCL 10 MG/ML IJ SOLN
INTRAMUSCULAR | Status: DC | PRN
  Administered 2017-07-10 (×3): 40 ug via INTRAVENOUS

## 2017-07-10 SURGICAL SUPPLY — 25 items

## 2017-07-10 NOTE — Op Note (Signed)
[facility] Patient Name: [name] Procedure Date : 07/10/2017 MRN: [id number] Attending MD: Otis Brace , MD Date of Birth: 05-01-1951 CSN: [id number] Age: 66 Admit Type: Outpatient Procedure:                Colonoscopy Indications:              Abnormal virtual colonoscopy Providers:                Otis Brace, MD, Burtis Junes, RN, Marcene Duos,                            Technician Referring MD:              Medicines:                Sedation Administered by an Anesthesia Professional Complications:            No immediate complications. Estimated Blood Loss:     Estimated blood loss was minimal. Procedure:                Pre-Anesthesia Assessment:                           - Prior to the procedure, a History and Physical                            was performed, and patient medications and                            allergies were reviewed. The patient's tolerance of                            previous anesthesia was also reviewed. The risks                            and benefits of the procedure and the sedation                            options and risks were discussed with the patient.                            All questions were answered, and informed consent                            was obtained. Prior Anticoagulants: The patient has                            taken no previous anticoagulant or antiplatelet                            agents. ASA Grade Assessment: III - A patient with                            severe systemic disease. After reviewing the risks  and benefits, the patient was deemed in                            satisfactory condition to undergo the procedure.                           - Prior to the procedure, a History and Physical                            was performed, and patient medications and                            allergies were reviewed. The patient's tolerance of     previous anesthesia was also reviewed. The risks                            and benefits of the procedure and the sedation                            options and risks were discussed with the patient.                            All questions were answered, and informed consent                            was obtained. Prior Anticoagulants: The patient has                            taken no previous anticoagulant or antiplatelet                            agents. ASA Grade Assessment: III - A patient with                            severe systemic disease. After reviewing the risks                            and benefits, the patient was deemed in                            satisfactory condition to undergo the procedure.                           After obtaining informed consent, the colonoscope                            was passed under direct vision. Throughout the                            procedure, the patient's blood pressure, pulse, and                            oxygen saturations were monitored continuously. The  Colonoscope was introduced through the anus and                            advanced to the the cecum, identified by                            appendiceal orifice and ileocecal valve. The                            colonoscopy was technically difficult and complex                            due to significant looping. Successful completion                            of the procedure was aided by applying abdominal                            pressure. The patient tolerated the procedure well.                            The quality of the bowel preparation was good. The                            ileocecal valve, appendiceal orifice, and rectum                            were photographed. The quality of the bowel                            preparation was good. Scope In: 12:21:56 PM Scope Out: 1:07:19 PM Scope Withdrawal Time: 0 hours 31  minutes 6 seconds  Total Procedure Duration: 0 hours 45 minutes 23 seconds  Findings:      The perianal and digital rectal examinations were normal.      A 8 mm polyp was found in the transverse colon. The polyp was sessile.       The polyp was removed with a cold snare. Resection and retrieval were       complete. polyp was removed while inserting scope.      A 8 mm polyp was found in the splenic flexure. The polyp was sessile.       The polyp was removed with a piecemeal technique using a cold snare.       Resection and retrieval were complete. polyp was removed while inserting       scope      A 10 mm polyp was found in the descending colon. The polyp was sessile.       The polyp was removed with a hot snare. Resection and retrieval were       complete. ( Placed with splenic flexture polyp)      A 10 mm polyp was found in the sigmoid colon. The polyp was       pedunculated. The polyp was removed with a hot snare. Resection and       retrieval were complete.      A 15 mm polyp was found  in the sigmoid colon. The polyp was       semi-sessile. initial attempt to do complete polypectomy with       colonoscope was not successful . subsequently upper endoscope was used.       The polyp was removed with a piecemeal technique using a hot snare.       SubsequentlyResection and retrieval were complete. To close a defect       after polypectomy, one hemostatic clip was successfully placed.      A few small and large-mouthed diverticula were found in the entire colon.      Internal hemorrhoids were found during retroflexion. The hemorrhoids       were small. Impression:               - One 8 mm polyp in the transverse colon, removed                            with a cold snare. Resected and retrieved.                           - One 8 mm polyp at the splenic flexure, removed                            piecemeal using a cold snare. Resected and                            retrieved.                            - One 10 mm polyp in the descending colon, removed                            with a hot snare. Resected and retrieved.                           - One 10 mm polyp in the sigmoid colon, removed                            with a hot snare. Resected and retrieved.                           - One 15 mm polyp in the sigmoid colon, removed                            piecemeal using a hot snare. Resected and                            retrieved. Clip was placed.                           - Diverticulosis in the entire examined colon.                           - Internal hemorrhoids. Moderate Sedation:      Moderate (conscious) sedation was personally administered by an  anesthesia professional. The following parameters were monitored: oxygen       saturation, heart rate, blood pressure, and response to care. Recommendation:           - Patient has a contact number available for                            emergencies. The signs and symptoms of potential                            delayed complications were discussed with the                            patient. Return to normal activities tomorrow.                            Written discharge instructions were provided to the                            patient.                           - Full liquid diet today.                           - Continue present medications.                           - Await pathology results.                           - Repeat colonoscopy in 1 year for surveillance                            after piecemeal polypectomy.                           - Return to my office as previously scheduled. Procedure Code(s):        --- Professional ---                           [phone], Colonoscopy, flexible; with removal of                            tumor(s), polyp(s), or other lesion(s) by snare                            technique Diagnosis Code(s):        --- Professional ---                           D12.3,  Benign neoplasm of transverse colon (hepatic                            flexure or splenic flexure)  D12.4, Benign neoplasm of descending colon                           D12.5, Benign neoplasm of sigmoid colon                           K64.8, Other hemorrhoids                           K57.30, Diverticulosis of large intestine without                            perforation or abscess without bleeding                           R93.3, Abnormal findings on diagnostic imaging of                            other parts of digestive tract CPT copyright 2016 American [employer]. All rights reserved. The codes documented in this report are preliminary and upon coder review may  be revised to meet current compliance requirements. Otis Brace, MD Otis Brace, MD 07/10/2017 1:26:12 PM Number of Addenda: 0

## 2017-07-10 NOTE — Anesthesia Procedure Notes (Signed)
Procedure Name: MAC Performed by: Valda Favia Pre-anesthesia Checklist: Patient identified, Emergency Drugs available, Suction available, Patient being monitored and Timeout performed Oxygen Delivery Method: Nasal cannula Preoxygenation: Pre-oxygenation with 100% oxygen Placement Confirmation: positive ETCO2 Dental Injury: Teeth and Oropharynx as per pre-operative assessment

## 2017-07-10 NOTE — Op Note (Signed)
[facility] Patient Name: [name] Procedure Date : 07/10/2017 MRN: [id number] Attending MD: Otis Brace , MD Date of Birth: 1951-09-11 CSN: [id number] Age: 66 Admit Type: Outpatient Procedure:                Upper GI endoscopy Indications:              Exclusion of esophageal varices in patient with                            suspected portal hypertension Providers:                Otis Brace, MD, Burtis Junes, RN, Marcene Duos,                            Technician Referring MD:              Medicines:                Sedation Administered by an Anesthesia Professional Complications:            No immediate complications. Estimated Blood Loss:     Estimated blood loss was minimal. Procedure:                Pre-Anesthesia Assessment:                           - Prior to the procedure, a History and Physical                            was performed, and patient medications and                            allergies were reviewed. The patient's tolerance of                            previous anesthesia was also reviewed. The risks                            and benefits of the procedure and the sedation                            options and risks were discussed with the patient.                            All questions were answered, and informed consent                            was obtained. Prior Anticoagulants: The patient has                            taken no previous anticoagulant or antiplatelet                            agents. ASA Grade Assessment: III - A patient with  severe systemic disease. After reviewing the risks                            and benefits, the patient was deemed in                            satisfactory condition to undergo the procedure.                           After obtaining informed consent, the endoscope was                            passed under direct vision. Throughout the            procedure, the patient's blood pressure, pulse, and                            oxygen saturations were monitored continuously. The                            EG-[id number] ([id number]) scope was introduced through the                            mouth, and advanced to the second part of duodenum.                            The upper GI endoscopy was accomplished without                            difficulty. The patient tolerated the procedure                            well. Scope In: Scope Out: Findings:      There is no endoscopic evidence of ulcerations or varices in the entire       esophagus.      Localized mild mucosal changes characterized by erythema and an       increased vascular pattern were found at the gastroesophageal junction.       Biopsies were taken with a cold forceps for histology.      A 4 cm hiatal hernia was present.      Normal mucosa was found in the entire examined stomach.      There is no endoscopic evidence of varices in the cardia and in the       gastric fundus.      The cardia and gastric fundus were normal on retroflexion.      The duodenal bulb, first portion of the duodenum and second portion of       the duodenum were normal. Impression:               - Erythematous, increased vascular pattern mucosa                            in the esophagus. Biopsied.                           -  4 cm hiatal hernia.                           - Normal mucosa was found in the entire stomach.                           - Normal duodenal bulb, first portion of the                            duodenum and second portion of the duodenum. Moderate Sedation:      Moderate (conscious) sedation was personally administered by an       anesthesia professional. The following parameters were monitored: oxygen       saturation, heart rate, blood pressure, and response to care. Recommendation:           - Full liquid diet.                           - Await pathology results.                            - Perform a colonoscopy today.                           - Continue present medications. Procedure Code(s):        --- Professional ---                           [phone], Esophagogastroduodenoscopy, flexible,                            transoral; with biopsy, single or multiple Diagnosis Code(s):        --- Professional ---                           K22.8, Other specified diseases of esophagus                           K44.9, Diaphragmatic hernia without obstruction or                            gangrene CPT copyright 2016 American Medical Association. All rights reserved. The codes documented in this report are preliminary and upon coder review may  be revised to meet current compliance requirements. Otis Brace, MD Otis Brace, MD 07/10/2017 1:15:05 PM Number of Addenda: 0

## 2017-07-10 NOTE — H&P (Signed)
[name] is an 66 y.o. male.   Chief Complaint: Patient is here for EGD and colonoscopy   HPI: This is a 66 year old patient was initially seen in the office for evaluation of colon cancer screening. Vision with history of CVA with residual weakness. Patient with history of seizure disorder. After reviewing multiple options for screening, they opted for a virtual colonoscopy. Virtual colonoscopy showed 3 lesions with one definite sessile polyp , 48mm, at splenic flexure and 2 other lesion could be non tagged stool.  CT scan also showed early cirrhosis. Patient with history of hepatitis C status post treatment with harmonic by infectious disease.  Recent denied any GI symptoms today. Denied chest pain shortness of breath. Overall feeling better.  Past Medical History:  Diagnosis Date  . Alcoholism ([place])    History of, not active  . Dementia   . Gait disorder   . Gait disorder   . 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 Hemiparesis and alteration of sensations as late effects of stroke ([place]) 01/25/2017   Left hemiparesis  . History of alcoholism ([place])   . Hx of ischemic vertebrobasilar artery thalamic stroke    Right  . Memory disorder 03/04/2013  . Seizures ([place])   . Stroke [facility])    Right frontal, right thalamic  . Ulnar neuropathy of left upper extremity     Past Surgical History:  Procedure Laterality Date  . HIP ARTHROPLASTY Left 11/26/2013   Procedure: LEFT HIP HEMIARTHROPLASTY;  Surgeon: Mcarthur Rossetti, MD;  Location: [place];  Service: Orthopedics;  Laterality: Left;  . WRIST SURGERY Left 95 & 96    Family History  Problem Relation Age of Onset  . Pulmonary embolism Mother   . Liver disease Father   . Diabetes Sister   . Hypertension Sister   . Hypertension Sister   . Hypertension Sister   . Seizures Neg Hx    Social History:  reports that he quit smoking about 3 years ago. He smoked 3.00 packs per day. He has quit using smokeless tobacco. He  reports that he does not drink alcohol or use drugs.  Allergies: No Known Allergies  Medications Prior to Admission  Medication Sig Dispense Refill  . aspirin 325 MG EC tablet Take 1 tablet (325 mg total) by mouth daily. 90 tablet 3  . Cholecalciferol (VITAMIN D-3) 1000 UNITS CAPS Take 1,000 Units by mouth daily.     . hydrochlorothiazide (HYDRODIURIL) 25 MG tablet Take 1 tablet (25 mg total) by mouth daily. 90 tablet 3  . ibuprofen (ADVIL,MOTRIN) 200 MG tablet Take 400 mg by mouth 2 (two) times daily as needed for moderate pain.    Marland Kitchen levETIRAcetam (KEPPRA) 750 MG tablet Take 2 tablets (1,500 mg total) by mouth 2 (two) times daily. 120 tablet 5  . memantine (NAMENDA) 10 MG tablet Take 1 tablet (10 mg total) by mouth 2 (two) times daily. 180 tablet 3  . metoprolol succinate (TOPROL-XL) 25 MG 24 hr tablet Take 1 tablet (25 mg total) by mouth daily. Needs office visit for refills 90 tablet 2  . simvastatin (ZOCOR) 20 MG tablet Take 0.5 tablets (10 mg total) by mouth every evening. 90 tablet 5    No results found for this or any previous visit (from the past 48 hour(s)). No results found.  ROS: Negative except as mentioned in history of present illness  There were no vitals taken for this visit. Physical Exam  Constitutional: He appears well-developed  and well-nourished. No distress.  Cardiovascular: Normal rate, regular rhythm and normal heart sounds.   Respiratory: Effort normal and breath sounds normal.  GI: Soft. Bowel sounds are normal. He exhibits no distension and no mass. There is no tenderness. There is no rebound.    Physical Exam  Constitutional: He appears well-developed and well-nourished. No distress.  Cardiovascular: Normal rate, regular rhythm and normal heart sounds.   Pulmonary/Chest: Effort normal and breath sounds normal.  Abdominal: Soft. Bowel sounds are normal. He exhibits no distension and no mass. There is no tenderness. There is no rebound.   Assessment/Plan -  Abnormal virtual colonoscopy - Abnormal CT scan showing early cirrhosis. - H/O hepatitis C. Status post treatment with Harvoni by ID.   Recommendations ---------------------------- - Proceed with EGD to screen for varices and Colonoscopy for abnormal virtual colonoscopy. Risks benefits and alternatives discussed with the patient and patient's sister. They verbalized understanding. - Patient had blood work in August which showed normal CBC, normal LFTs, normal INR and normal platelets.     Otis Brace, MD 07/10/2017, 10:57 AM

## 2017-07-10 NOTE — Discharge Instructions (Signed)

## 2017-07-10 NOTE — Transfer of Care (Signed)
Immediate Anesthesia Transfer of Care Note  Patient: [name]  Procedure(s) Performed: Procedure(s): COLONOSCOPY WITH PROPOFOL (N/A) ESOPHAGOGASTRODUODENOSCOPY (EGD) WITH PROPOFOL (N/A)  Patient Location: Endoscopy Unit  Anesthesia Type:MAC  Level of Consciousness: drowsy  Airway & Oxygen Therapy: Patient Spontanous Breathing and Patient connected to nasal cannula oxygen  Post-op Assessment: Report given to RN and Post -op Vital signs reviewed and stable  Post vital signs: Reviewed and stable  Last Vitals:  Vitals:   07/10/17 1319 07/10/17 1320  BP:  122/86  Pulse:  68  Resp:  15  Temp:    SpO2: 99% 98%    Last Pain:  Vitals:   07/10/17 1320  TempSrc: Oral         Complications: No apparent anesthesia complications

## 2017-07-10 NOTE — Anesthesia Preprocedure Evaluation (Signed)
Anesthesia Evaluation  Patient identified by MRN, date of birth, ID band Patient awake    Reviewed: Allergy & Precautions, NPO status , Patient's Chart, lab work & pertinent test results  Airway Mallampati: II  TM Distance: >3 FB     Dental   Pulmonary former smoker,    breath sounds clear to auscultation       Cardiovascular hypertension, Pt. on medications and Pt. on home beta blockers + Peripheral Vascular Disease   Rhythm:Regular Rate:Normal     Neuro/Psych Seizures -,  Depression CVA    GI/Hepatic negative GI ROS, (+) Hepatitis -, C  Endo/Other  negative endocrine ROS  Renal/GU negative Renal ROS     Musculoskeletal   Abdominal   Peds  Hematology negative hematology ROS (+)   Anesthesia Other Findings   Reproductive/Obstetrics                             Anesthesia Physical Anesthesia Plan  ASA: III  Anesthesia Plan: MAC   Post-op Pain Management:    Induction: Intravenous  PONV Risk Score and Plan: 1 and Ondansetron, Propofol infusion and Treatment may vary due to age or medical condition  Airway Management Planned: Natural Airway and Simple Face Mask  Additional Equipment:   Intra-op Plan:   Post-operative Plan:   Informed Consent: I have reviewed the patients History and Physical, chart, labs and discussed the procedure including the risks, benefits and alternatives for the proposed anesthesia with the patient or authorized representative who has indicated his/her understanding and acceptance.     Plan Discussed with: CRNA  Anesthesia Plan Comments:         Anesthesia Quick Evaluation

## 2017-07-11 ENCOUNTER — Encounter (HOSPITAL_COMMUNITY): Payer: Self-pay | Admitting: Gastroenterology

## 2017-07-11 NOTE — Anesthesia Postprocedure Evaluation (Signed)
Anesthesia Post Note  Patient: [name]  Procedure(s) Performed: Procedure(s) (LRB): COLONOSCOPY WITH PROPOFOL (N/A) ESOPHAGOGASTRODUODENOSCOPY (EGD) WITH PROPOFOL (N/A)     Patient location during evaluation: PACU Anesthesia Type: MAC Level of consciousness: awake and alert Pain management: pain level controlled Vital Signs Assessment: post-procedure vital signs reviewed and stable Respiratory status: spontaneous breathing, nonlabored ventilation, respiratory function stable and patient connected to nasal cannula oxygen Cardiovascular status: stable and blood pressure returned to baseline Postop Assessment: no apparent nausea or vomiting Anesthetic complications: no    Last Vitals:  Vitals:   07/10/17 1345 07/10/17 1350  BP:    Pulse: 63 64  Resp: 13 15  Temp:    SpO2: 99% 100%    Last Pain:  Vitals:   07/10/17 1330  TempSrc: Oral                 Tiajuana Amass

## 2017-07-22 ENCOUNTER — Other Ambulatory Visit: Payer: Self-pay | Admitting: Neurology

## 2017-07-24 ENCOUNTER — Other Ambulatory Visit: Payer: Self-pay | Admitting: Neurology

## 2017-09-02 ENCOUNTER — Encounter: Payer: Self-pay | Admitting: Internal Medicine

## 2017-09-02 ENCOUNTER — Ambulatory Visit: Payer: Medicare Other | Attending: Internal Medicine | Admitting: Internal Medicine

## 2017-09-02 VITALS — BP 160/73 | HR 68 | Temp 98.4°F | Resp 16 | Wt 167.4 lb

## 2017-09-02 DIAGNOSIS — B182 Chronic viral hepatitis C: Secondary | ICD-10-CM | POA: Diagnosis not present

## 2017-09-02 DIAGNOSIS — D126 Benign neoplasm of colon, unspecified: Secondary | ICD-10-CM | POA: Diagnosis not present

## 2017-09-02 DIAGNOSIS — G40909 Epilepsy, unspecified, not intractable, without status epilepticus: Secondary | ICD-10-CM | POA: Insufficient documentation

## 2017-09-02 DIAGNOSIS — R3981 Functional urinary incontinence: Secondary | ICD-10-CM

## 2017-09-02 DIAGNOSIS — F329 Major depressive disorder, single episode, unspecified: Secondary | ICD-10-CM | POA: Insufficient documentation

## 2017-09-02 DIAGNOSIS — M25551 Pain in right hip: Secondary | ICD-10-CM

## 2017-09-02 DIAGNOSIS — I69398 Other sequelae of cerebral infarction: Secondary | ICD-10-CM

## 2017-09-02 DIAGNOSIS — Z23 Encounter for immunization: Secondary | ICD-10-CM

## 2017-09-02 DIAGNOSIS — Z87891 Personal history of nicotine dependence: Secondary | ICD-10-CM | POA: Diagnosis not present

## 2017-09-02 DIAGNOSIS — R269 Unspecified abnormalities of gait and mobility: Secondary | ICD-10-CM | POA: Diagnosis not present

## 2017-09-02 DIAGNOSIS — R350 Frequency of micturition: Secondary | ICD-10-CM | POA: Diagnosis not present

## 2017-09-02 DIAGNOSIS — K449 Diaphragmatic hernia without obstruction or gangrene: Secondary | ICD-10-CM | POA: Insufficient documentation

## 2017-09-02 DIAGNOSIS — M25552 Pain in left hip: Secondary | ICD-10-CM | POA: Diagnosis not present

## 2017-09-02 DIAGNOSIS — I10 Essential (primary) hypertension: Secondary | ICD-10-CM | POA: Diagnosis not present

## 2017-09-02 DIAGNOSIS — I69359 Hemiplegia and hemiparesis following cerebral infarction affecting unspecified side: Secondary | ICD-10-CM

## 2017-09-02 DIAGNOSIS — Z7982 Long term (current) use of aspirin: Secondary | ICD-10-CM | POA: Insufficient documentation

## 2017-09-02 DIAGNOSIS — Z79899 Other long term (current) drug therapy: Secondary | ICD-10-CM | POA: Diagnosis not present

## 2017-09-02 DIAGNOSIS — R209 Unspecified disturbances of skin sensation: Secondary | ICD-10-CM | POA: Insufficient documentation

## 2017-09-02 DIAGNOSIS — I69354 Hemiplegia and hemiparesis following cerebral infarction affecting left non-dominant side: Secondary | ICD-10-CM | POA: Diagnosis not present

## 2017-09-02 DIAGNOSIS — K648 Other hemorrhoids: Secondary | ICD-10-CM | POA: Insufficient documentation

## 2017-09-02 NOTE — Progress Notes (Signed)
Patient ID: [name], male    DOB: [birth year]  MRN: [id number]  CC: re-establish and Hypertension   Subjective: [name] is a 66 y.o. male who presents for chronic disease management.  Last saw Dr. Clide Dales May 2018. Sister, [name] is with him His concerns today include:  Patient with history of hepatitis C treated, seizure disorder, CVA with residual left-sided weakness uses a rolling walker, HTN, memory deficit (followed by neurology)  1. Recently saw GI and had c-scope and EGD -Had 5 polyps removed several of which were tubular adenoma.  Also found to have diverticuli and internal hemorrhoids. EGD revealed erythema and increased vascular pattern in the mucosa of the esophagus.  And 4 cm hiatal hernia.  2. Pain in legs sometimes more so in hips. Had surgery LT hip in past for fracture -sister would like for him to get rollator with seat so that he can sit in it when legs get tired. They walk to bus stop -prefers city bus rather than [facility] because latter takes to long for pick up  3. Frequent urination x 4 mth or more -sometimes not able to make it to bathroom in time and has incontinence. Enuresis at times. Gets up at nights to urinate but not sure how often -feels like he empties completely when he goes -sister stopped HCTZ 3-4 mths ago but pt still goes frequent  4. HTN: taking Metoprolol -limits salt in foods. Sister cooks. Good appetite -no CP/occasional SOB  5. No recent sz.  Patient Active Problem List   Diagnosis Date Noted  . Adenomatous polyp of colon 09/02/2017  . Functional urinary incontinence 09/02/2017  . Hemiparesis and alteration of sensations as late effects of stroke ([place]) 01/25/2017  . 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/23/2015  . HTN (hypertension) 06/20/2015  . 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  . Chronic hepatitis C without hepatic coma ([place]) 03/22/2015  . Hip fracture ([place]) 11/26/2013  . Seizure ([place]) 11/26/2013  . Memory disorder 03/04/2013  .  Depressive disorder, not elsewhere classified 07/24/2012  . Other persistent mental disorders due to conditions classified elsewhere 07/24/2012  . Abnormality of gait 07/24/2012  . Encounter for long-term (current) use of other medications 07/24/2012  . Localization-related (focal) (partial) epilepsy and epileptic syndromes with complex partial seizures, without mention of intractable epilepsy 07/24/2012  . Cerebral thrombosis with cerebral infarction ([place]) 07/24/2012  . Lesion of ulnar nerve 07/24/2012  . Unspecified persistent mental disorders due to conditions classified elsewhere 07/24/2012     Current Outpatient Medications on File Prior to Visit  Medication Sig Dispense Refill  . aspirin 325 MG EC tablet Take 1 tablet (325 mg total) by mouth daily. 90 tablet 3  . Cholecalciferol (VITAMIN D-3) 1000 UNITS CAPS Take 1,000 Units by mouth daily.     Marland Kitchen levETIRAcetam (KEPPRA) 750 MG tablet Take 2 tablets (1,500 mg total) by mouth 2 (two) times daily. 120 tablet 5  . levETIRAcetam (KEPPRA) 750 MG tablet TAKE 2 TABLETS (1,500 MG TOTAL) BY MOUTH 2 (TWO) TIMES DAILY. 120 tablet 5  . memantine (NAMENDA) 10 MG tablet Take 1 tablet (10 mg total) by mouth 2 (two) times daily. 180 tablet 3  . metoprolol succinate (TOPROL-XL) 25 MG 24 hr tablet Take 1 tablet (25 mg total) by mouth daily. Needs office visit for refills 90 tablet 2  . simvastatin (ZOCOR) 20 MG tablet Take 0.5 tablets (10 mg total) by mouth every evening. 90 tablet 5   No current facility-administered medications on file  prior to visit.     No Known Allergies  Social History   Socioeconomic History  . Marital status: Divorced    Spouse name: Not on file  . Number of children: 0  . Years of education: 10  . Highest education level: Not on file  Social Needs  . Financial resource strain: Not on file  . Food insecurity - worry: Not on file  . Food insecurity - inability: Not on file  . Transportation needs - medical: Not on file    . Transportation needs - non-medical: Not on file  Occupational History  . Not on file  Tobacco Use  . Smoking status: Former Smoker    Packs/day: 3.00    Last attempt to quit: 01/02/2014    Years since quitting: 3.6  . Smokeless tobacco: Former Network engineer and Sexual Activity  . Alcohol use: No    Alcohol/week: 0.0 oz    Comment: Former Alcoholic  . Drug use: No  . Sexual activity: Yes  Other Topics Concern  . Not on file  Social History Narrative   Patient is single and lives with a friend and her son.   Patient does not have any children.   Patient is disabled.   Patient has a 10 th grade education.   Patient is left-handed.   Patient drinks some soda but not everyday.    Family History  Problem Relation Age of Onset  . Pulmonary embolism Mother   . Liver disease Father   . Diabetes Sister   . Hypertension Sister   . Hypertension Sister   . Hypertension Sister   . Seizures Neg Hx     Past Surgical History:  Procedure Laterality Date  . WRIST SURGERY Left 95 & 96    ROS: Review of Systems Neg except as above  PHYSICAL EXAM: BP (!) 160/73   Pulse 68   Temp 98.4 F (36.9 C) (Oral)   Resp 16   Wt 167 lb 6.4 oz (75.9 kg)   SpO2 96%   BMI 24.72 kg/m   Wt Readings from Last 3 Encounters:  09/02/17 167 lb 6.4 oz (75.9 kg)  07/10/17 163 lb (73.9 kg)  03/13/17 163 lb 9.6 oz (74.2 kg)  BP 130/70  Physical Exam  General appearance -pleasant elderly African-American male in NAD Mental status -patient does not know the day of the week month or year.  He follows commands inconsistently. Mouth -edentulous.   Neck - supple, no significant adenopathy Chest - clear to auscultation, no wheezes, rales or rhonchi, symmetric air entry Heart - normal rate, regular rhythm, normal S1, S2, no murmurs, rubs, clicks or gallops Musculoskeletal -ambulates with a rolling walker.  He seems to favor his right leg. Decreased grip on the left. Difficult to assess power in  upper extremities as patient not consistent and following commands. Difficult to assess power in lower extremities as patient inconsistent in following commands and seem to have increase tone in lower legs Extremities - no LE edema Rectal exam: prostate slightly enlarged Lab Results  Component Value Date   WBC 6.0 03/13/2017   HGB 15.0 03/13/2017   HCT 44.6 03/13/2017   MCV 94 03/13/2017   PLT 180 03/13/2017     Chemistry      Component Value Date/Time   NA 139 04/08/2017 1642   NA 138 03/13/2017 1039   K 4.0 04/08/2017 1642   CL 100 04/08/2017 1642   CO2 28 04/08/2017 1642   BUN 13  04/08/2017 1642   BUN 18 03/13/2017 1039   CREATININE 0.93 04/08/2017 1642      Component Value Date/Time   CALCIUM 9.5 04/08/2017 1642   ALKPHOS 99 04/08/2017 1642   AST 13 04/08/2017 1642   ALT 9 04/08/2017 1642   BILITOT 0.5 04/08/2017 1642   BILITOT 0.3 02/21/2015 0910     Lab Results  Component Value Date   CHOL 128 03/13/2017   HDL 32 (L) 03/13/2017   LDLCALC 76 03/13/2017   TRIG 100 03/13/2017   CHOLHDL 4.0 03/13/2017   Lab Results  Component Value Date   PSA 0.62 03/02/2015    Lab Results  Component Value Date   HGBA1C 5.3 05/23/2016   ASSESSMENT AND PLAN: 1. Essential hypertension -at goal HCTZ taken off list since he has not been on it for past 3-4 mths and BP today ok without it.  Continue Metoprolol  2. Pain of both hip joints With hx of surgery on LT Recommend Tylenol 500 mg BID PRN. Sister says she has some at home - DG HIPS BILAT WITH PELVIS 3-4 VIEWS; Future  3. Hemiparesis and alteration of sensations as late effects of stroke ([place]) 4. Gait disturbance, post-stroke Rxn given for rollator walker with seat - DME Other see comment  5. Functional urinary incontinence -rxn give for Depends. Likely also has some BPH but will not start on Flomax given memory deficit, may inc his risk for fall - DME Other see comment  6. Need for influenza vaccination - Flu  Vaccine QUAD 36+ mos IM  7. Adenomatous polyp of colon, unspecified part of colon Followed by GI   Patient was given the opportunity to ask questions.  Patient verbalized understanding of the plan and was able to repeat key elements of the plan.   Orders Placed This Encounter  Procedures  . DME Other see comment  . DME Other see comment  . DG HIPS BILAT WITH PELVIS 3-4 VIEWS  . Flu Vaccine QUAD 36+ mos IM     Requested Prescriptions    No prescriptions requested or ordered in this encounter    Return in about 3 months (around 12/03/2017).  Karle Plumber, MD, FACP

## 2017-09-02 NOTE — Patient Instructions (Addendum)
Stop HCTZ.   Influenza Virus Vaccine injection (Fluarix) What is this medicine? INFLUENZA VIRUS VACCINE (in floo EN zuh VAHY ruhs vak SEEN) helps to reduce the risk of getting influenza also known as the flu. This medicine may be used for other purposes; ask your health care provider or pharmacist if you have questions. COMMON BRAND NAME(S): Fluarix, Fluzone What should I tell my health care provider before I take this medicine? They need to know if you have any of these conditions: -bleeding disorder like hemophilia -fever or infection -Guillain-Barre syndrome or other neurological problems -immune system problems -infection with the human immunodeficiency virus (HIV) or AIDS -low blood platelet counts -multiple sclerosis -an unusual or allergic reaction to influenza virus vaccine, eggs, chicken proteins, latex, gentamicin, other medicines, foods, dyes or preservatives -pregnant or trying to get pregnant -breast-feeding How should I use this medicine? This vaccine is for injection into a muscle. It is given by a health care professional. A copy of Vaccine Information Statements will be given before each vaccination. Read this sheet carefully each time. The sheet may change frequently. Talk to your pediatrician regarding the use of this medicine in children. Special care may be needed. Overdosage: If you think you have taken too much of this medicine contact a poison control center or emergency room at once. NOTE: This medicine is only for you. Do not share this medicine with others. What if I miss a dose? This does not apply. What may interact with this medicine? -chemotherapy or radiation therapy -medicines that lower your immune system like etanercept, anakinra, infliximab, and adalimumab -medicines that treat or prevent blood clots like warfarin -phenytoin -steroid medicines like prednisone or cortisone -theophylline -vaccines This list may not describe all possible  interactions. Give your health care provider a list of all the medicines, herbs, non-prescription drugs, or dietary supplements you use. Also tell them if you smoke, drink alcohol, or use illegal drugs. Some items may interact with your medicine. What should I watch for while using this medicine? Report any side effects that do not go away within 3 days to your doctor or health care professional. Call your health care provider if any unusual symptoms occur within 6 weeks of receiving this vaccine. You may still catch the flu, but the illness is not usually as bad. You cannot get the flu from the vaccine. The vaccine will not protect against colds or other illnesses that may cause fever. The vaccine is needed every year. What side effects may I notice from receiving this medicine? Side effects that you should report to your doctor or health care professional as soon as possible: -allergic reactions like skin rash, itching or hives, swelling of the face, lips, or tongue Side effects that usually do not require medical attention (report to your doctor or health care professional if they continue or are bothersome): -fever -headache -muscle aches and pains -pain, tenderness, redness, or swelling at site where injected -weak or tired This list may not describe all possible side effects. Call your doctor for medical advice about side effects. You may report side effects to FDA at 1-800-FDA-1088. Where should I keep my medicine? This vaccine is only given in a clinic, pharmacy, doctor's office, or other health care setting and will not be stored at home. NOTE: This sheet is a summary. It may not cover all possible information. If you have questions about this medicine, talk to your doctor, pharmacist, or health care provider.  2018 Elsevier/Gold Standard (2008-05-05 09:30:40)

## 2017-12-03 ENCOUNTER — Ambulatory Visit: Payer: Medicare Other | Admitting: Internal Medicine

## 2017-12-03 ENCOUNTER — Other Ambulatory Visit: Payer: Self-pay | Admitting: Neurology

## 2017-12-12 ENCOUNTER — Encounter: Payer: Self-pay | Admitting: Internal Medicine

## 2017-12-12 ENCOUNTER — Ambulatory Visit: Payer: Medicare Other | Attending: Internal Medicine | Admitting: Internal Medicine

## 2017-12-12 VITALS — BP 125/73 | HR 68 | Temp 98.3°F | Resp 16 | Wt 172.0 lb

## 2017-12-12 DIAGNOSIS — G40909 Epilepsy, unspecified, not intractable, without status epilepticus: Secondary | ICD-10-CM | POA: Insufficient documentation

## 2017-12-12 DIAGNOSIS — Z23 Encounter for immunization: Secondary | ICD-10-CM | POA: Diagnosis not present

## 2017-12-12 DIAGNOSIS — G4700 Insomnia, unspecified: Secondary | ICD-10-CM | POA: Diagnosis not present

## 2017-12-12 DIAGNOSIS — Z87891 Personal history of nicotine dependence: Secondary | ICD-10-CM | POA: Diagnosis not present

## 2017-12-12 DIAGNOSIS — R569 Unspecified convulsions: Secondary | ICD-10-CM | POA: Diagnosis not present

## 2017-12-12 DIAGNOSIS — Z79899 Other long term (current) drug therapy: Secondary | ICD-10-CM | POA: Diagnosis not present

## 2017-12-12 DIAGNOSIS — Z7982 Long term (current) use of aspirin: Secondary | ICD-10-CM | POA: Insufficient documentation

## 2017-12-12 DIAGNOSIS — I10 Essential (primary) hypertension: Secondary | ICD-10-CM | POA: Diagnosis not present

## 2017-12-12 DIAGNOSIS — I69398 Other sequelae of cerebral infarction: Secondary | ICD-10-CM | POA: Diagnosis not present

## 2017-12-12 DIAGNOSIS — R269 Unspecified abnormalities of gait and mobility: Secondary | ICD-10-CM | POA: Diagnosis not present

## 2017-12-12 DIAGNOSIS — F329 Major depressive disorder, single episode, unspecified: Secondary | ICD-10-CM | POA: Insufficient documentation

## 2017-12-12 DIAGNOSIS — I69354 Hemiplegia and hemiparesis following cerebral infarction affecting left non-dominant side: Secondary | ICD-10-CM | POA: Insufficient documentation

## 2017-12-12 NOTE — Patient Instructions (Signed)
Try to get in bed around about the same time every night.  Turn off all lights and sounds once you get in bed.  Avoid drinking caffeinated beverages in the evenings.  If you are unable to fall asleep after 30-40 minutes, you show get up and watch TV until you feel sleepy.

## 2017-12-12 NOTE — Progress Notes (Signed)
Patient ID: [name], male    DOB: 1951/02/11  MRN: [id number]  CC: Hypertension and Insomnia   Subjective: [name] is a 67 y.o. male who presents for chronic ds management.  Sister, [name], is with him. His concerns today include:  Patient with history of hepatitis C treated, seizure disorder, CVA with residual left-sided weakness uses a rolling walker, HTN, memory deficit (followed by neurology), tubular adenomas  Sister gives most of the hx.  1. CVA:  Medicare did not pay for Rollator walker.  However, he was able to get 1 from a friend.  He is doing okay with it. -request a letter to assist in getting a ramp built outside his front door.  Has 8 steps at the front of his house and 2 at the back.  He has fallen a few times coming down the front steps.  Last fall was over a year ago.  The home is owned by a relative and they were told that if he gets a note from his physician they will try to build a ramp for him to make the entrance more handicap assessable  2.  Sz: no sz since last visit. Compliant with Keppra -problems sleeping at times.  Sleeps with TV on.  Usually gets in bed around 10 PM every night  3.  HTN: compliant with Metoprolol Uses a salt alternative. No CP/SOB Patient Active Problem List   Diagnosis Date Noted  . Adenomatous polyp of colon 09/02/2017  . Functional urinary incontinence 09/02/2017  . Hemiparesis and alteration of sensations as late effects of stroke ([place]) 01/25/2017  . 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/23/2015  . HTN (hypertension) 06/20/2015  . 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  . Chronic hepatitis C without hepatic coma ([place]) 03/22/2015  . Hip fracture ([place]) 11/26/2013  . Seizure ([place]) 11/26/2013  . Memory disorder 03/04/2013  . Depressive disorder, not elsewhere classified 07/24/2012  . Abnormality of gait 07/24/2012  . Localization-related (focal) (partial) epilepsy and epileptic syndromes with complex partial seizures, without mention of  intractable epilepsy 07/24/2012  . Cerebral thrombosis with cerebral infarction ([place]) 07/24/2012  . Lesion of ulnar nerve 07/24/2012     Current Outpatient Medications on File Prior to Visit  Medication Sig Dispense Refill  . levETIRAcetam (KEPPRA) 750 MG tablet TAKE 2 TABLETS (1,500 MG TOTAL) BY MOUTH 2 (TWO) TIMES DAILY. 120 tablet 3  . aspirin 325 MG EC tablet Take 1 tablet (325 mg total) by mouth daily. 90 tablet 3  . Cholecalciferol (VITAMIN D-3) 1000 UNITS CAPS Take 1,000 Units by mouth daily.     . memantine (NAMENDA) 10 MG tablet Take 1 tablet (10 mg total) by mouth 2 (two) times daily. 180 tablet 3  . metoprolol succinate (TOPROL-XL) 25 MG 24 hr tablet Take 1 tablet (25 mg total) by mouth daily. Needs office visit for refills 90 tablet 2  . simvastatin (ZOCOR) 20 MG tablet Take 0.5 tablets (10 mg total) by mouth every evening. 90 tablet 5   No current facility-administered medications on file prior to visit.     No Known Allergies  Social History   Socioeconomic History  . Marital status: Divorced    Spouse name: Not on file  . Number of children: 0  . Years of education: 10  . Highest education level: Not on file  Social Needs  . Financial resource strain: Not on file  . Food insecurity - worry: Not on file  . Food insecurity - inability: Not on  file  . Transportation needs - medical: Not on file  . Transportation needs - non-medical: Not on file  Occupational History  . Not on file  Tobacco Use  . Smoking status: Former Smoker    Packs/day: 3.00    Last attempt to quit: 01/02/2014    Years since quitting: 3.9  . Smokeless tobacco: Former Network engineer and Sexual Activity  . Alcohol use: No    Alcohol/week: 0.0 oz    Comment: Former Alcoholic  . Drug use: No  . Sexual activity: Yes  Other Topics Concern  . Not on file  Social History Narrative   Patient is single and lives with a friend and her son.   Patient does not have any children.   Patient is  disabled.   Patient has a 10 th grade education.   Patient is left-handed.   Patient drinks some soda but not everyday.    Family History  Problem Relation Age of Onset  . Pulmonary embolism Mother   . Liver disease Father   . Diabetes Sister   . Hypertension Sister   . Hypertension Sister   . Hypertension Sister   . Seizures Neg Hx     Past Surgical History:  Procedure Laterality Date  . COLONOSCOPY WITH PROPOFOL N/A 07/10/2017   Procedure: COLONOSCOPY WITH PROPOFOL;  Surgeon: Otis Brace, MD;  Location: [facility];  Service: Gastroenterology;  Laterality: N/A;  . ESOPHAGOGASTRODUODENOSCOPY (EGD) WITH PROPOFOL N/A 07/10/2017   Procedure: ESOPHAGOGASTRODUODENOSCOPY (EGD) WITH PROPOFOL;  Surgeon: Otis Brace, MD;  Location[facility];  Service: Gastroenterology;  Laterality: N/A;  . HIP ARTHROPLASTY Left 11/26/2013   Procedure: LEFT HIP HEMIARTHROPLASTY;  Surgeon: Mcarthur Rossetti, MD;  Location: [facility];  Service: Orthopedics;  Laterality: Left;  . WRIST SURGERY Left 95 & 96    ROS: Review of Systems Negative except as stated above PHYSICAL EXAM: BP 125/73   Pulse 68   Temp 98.3 F (36.8 C) (Oral)   Resp 16   Wt 172 lb (78 kg)   SpO2 99%   BMI 25.40 kg/m   Wt Readings from Last 3 Encounters:  12/12/17 172 lb (78 kg)  09/02/17 167 lb 6.4 oz (75.9 kg)  07/10/17 163 lb (73.9 kg)    Physical Exam  General appearance - alert, well appearing, and in no distress Mental status - oriented to person and place Neck - supple, no significant adenopathy Chest - clear to auscultation, no wheezes, rales or rhonchi, symmetric air entry Heart - normal rate, regular rhythm, normal S1, S2, no murmurs, rubs, clicks or gallops Neurological -decreased grip in the left hand. Power LEs 5/5 RT, 4+/5 LT. ambulates with Rollator walker.   Extremities -no lower extremity edema.  ASSESSMENT AND PLAN: 1. Essential hypertension At goal.  Continue current medications.  2.  Seizure ([place]) Stable on Keppra.  3. Gait disturbance, post-stroke -Doing well with Rollator walker.  4. Insomnia, unspecified type Discussed good sleep hygiene. Advised that he get in bed around the same time every night.  Turn off all lights and sounds.  If he does not fall asleep within 45 minutes of getting in bed, he should watch TV until eyes feel heavy and then turn off lights and sounds.  5. Need for vaccination against Streptococcus pneumoniae using pneumococcal conjugate vaccine 13 -Pharmacy is currently out.  He will get it on his next visit.  Patient was given the opportunity to ask questions.  Patient verbalized understanding of the plan and was  able to repeat key elements of the plan.   No orders of the defined types were placed in this encounter.    Requested Prescriptions    No prescriptions requested or ordered in this encounter    Return in about 3 months (around 03/11/2018).  Karle Plumber, MD, FACP

## 2018-01-23 ENCOUNTER — Telehealth: Payer: Self-pay

## 2018-01-23 ENCOUNTER — Other Ambulatory Visit: Payer: Self-pay

## 2018-01-23 MED ORDER — METOPROLOL SUCCINATE ER 25 MG PO TB24: 25.0000 mg | ORAL_TABLET | Freq: Every day | ORAL | 1 refills | Status: DC

## 2018-01-23 NOTE — Telephone Encounter (Signed)
Sent rx to [facility] [address]

## 2018-01-23 NOTE — Telephone Encounter (Signed)
Returned pt sister call to see what pharmacy she would like rx sent to lvm asking pt sister to give me a call with that information at her earliest convenience

## 2018-01-27 ENCOUNTER — Encounter: Payer: Self-pay | Admitting: Neurology

## 2018-01-27 ENCOUNTER — Ambulatory Visit (INDEPENDENT_AMBULATORY_CARE_PROVIDER_SITE_OTHER): Payer: Medicare Other | Admitting: Neurology

## 2018-01-27 ENCOUNTER — Other Ambulatory Visit: Payer: Self-pay

## 2018-01-27 VITALS — BP 125/69 | HR 89 | Ht 69.0 in | Wt 168.0 lb

## 2018-01-27 DIAGNOSIS — I69359 Hemiplegia and hemiparesis following cerebral infarction affecting unspecified side: Secondary | ICD-10-CM | POA: Diagnosis not present

## 2018-01-27 DIAGNOSIS — R413 Other amnesia: Secondary | ICD-10-CM | POA: Diagnosis not present

## 2018-01-27 DIAGNOSIS — I69398 Other sequelae of cerebral infarction: Secondary | ICD-10-CM | POA: Diagnosis not present

## 2018-01-27 DIAGNOSIS — R569 Unspecified convulsions: Secondary | ICD-10-CM | POA: Diagnosis not present

## 2018-01-27 MED ORDER — LEVETIRACETAM 750 MG PO TABS: 1500.0000 mg | ORAL_TABLET | Freq: Two times a day (BID) | ORAL | 3 refills | Status: DC

## 2018-01-27 NOTE — Progress Notes (Signed)
Reason for visit: Seizures  [name] is an 67 y.o. male  History of present illness:  [name] is a 67 year old left-handed black male with a history of cerebrovascular disease associated with a left hemiparesis.  [name] has had a history of seizures, he has not had any seizures since last seen 1 year ago.  [name] remains on Keppra and he seems to tolerate Joshua medication quite well.  [name] uses a walker for ambulation, he denies any recent falls.  He does have a memory problem, there have been no significant changes in his cognitive functioning level since last seen, he remains on Namenda.  [name] returns to Joshua office today for an evaluation.  Past Medical History:  Diagnosis Date  . Alcoholism ([place])    History of, not active  . Dementia   . Gait disorder   . Gait disorder   . 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 Hemiparesis and alteration of sensations as late effects of stroke ([place]) 01/25/2017   Left hemiparesis  . History of alcoholism ([place])   . Hx of ischemic vertebrobasilar artery thalamic stroke    Right  . Memory disorder 03/04/2013  . Seizures ([place])   . Stroke [facility])    Right frontal, right thalamic  . Ulnar neuropathy of left upper extremity     Past Surgical History:  Procedure Laterality Date  . COLONOSCOPY WITH PROPOFOL N/A 07/10/2017   Procedure: COLONOSCOPY WITH PROPOFOL;  Surgeon: Otis Brace, MD;  Location: [place];  Service: Gastroenterology;  Laterality: N/A;  . ESOPHAGOGASTRODUODENOSCOPY (EGD) WITH PROPOFOL N/A 07/10/2017   Procedure: ESOPHAGOGASTRODUODENOSCOPY (EGD) WITH PROPOFOL;  Surgeon: Otis Brace, MD;  [facility];  Service: Gastroenterology;  Laterality: N/A;  . HIP ARTHROPLASTY Left 11/26/2013   Procedure: LEFT HIP HEMIARTHROPLASTY;  Surgeon: Mcarthur Rossetti, MD;  Location: [place];  Service: Orthopedics;  Laterality: Left;  . WRIST SURGERY Left 95 & 96    Family History    Problem Relation Age of Onset  . Pulmonary embolism Mother   . Liver disease Father   . Diabetes Sister   . Hypertension Sister   . Hypertension Sister   . Hypertension Sister   . Seizures Neg Hx     Social history:  reports that he quit smoking about 4 years ago. He smoked 3.00 packs per day. He has quit using smokeless tobacco. He reports that he does not drink alcohol or use drugs.   No Known Allergies  Medications:  Prior to Admission medications   Medication Sig Start Date End Date Taking? Authorizing Provider  aspirin 325 MG EC tablet Take 1 tablet (325 mg total) by mouth daily. 03/13/17  Yes Langeland, Dawn T, MD  levETIRAcetam (KEPPRA) 750 MG tablet Take 2 tablets (1,500 mg total) by mouth 2 (two) times daily. 01/27/18  Yes Kathrynn Ducking, MD  memantine (NAMENDA) 10 MG tablet Take 1 tablet (10 mg total) by mouth 2 (two) times daily. 03/13/17  Yes Langeland, Dawn T, MD  metoprolol succinate (TOPROL-XL) 25 MG 24 hr tablet Take 1 tablet (25 mg total) by mouth daily. Needs office visit for refills 01/23/18  Yes Ladell Pier, MD  simvastatin (ZOCOR) 20 MG tablet Take 0.5 tablets (10 mg total) by mouth every evening. 03/13/17  Yes Langeland, Dawn T, MD    ROS:  Out of a complete 14 system review of symptoms, [name] complains only of Joshua following symptoms, and all  other reviewed systems are negative.  Runny nose Memory disturbance  Blood pressure 125/69, pulse 89, height 5\' 9"  (1.753 m), weight 168 lb (76.2 kg).  Physical Exam  General: [name] is alert and cooperative at Joshua time of Joshua examination.  Skin: No significant peripheral edema is noted.   Neurologic Exam  Mental status: [name] is alert and oriented x 2 at Joshua time of Joshua examination (not oriented to date). Joshua Mini-Mental status examination done today shows a total score of 11/30.   Cranial nerves: Facial symmetry is present. Speech is normal, no aphasia or dysarthria is noted. Extraocular  movements are full. Visual fields are full.  Motor: [name] has good strength in all 4 extremities.  Sensory examination: Soft touch sensation is symmetric on Joshua face, arms, and legs.  Coordination: [name] has good finger-nose-finger and heel-to-shin bilaterally, with exception of some dysmetria with finger-nose-finger on Joshua left.  Gait and station: [name] has a circumduction type gait with Joshua left leg.  [name] has some gait instability, he uses a walker for ambulation.  Romberg is negative.  Reflexes: Deep tendon reflexes are symmetric.   Assessment/Plan:  1.  Cerebrovascular disease, left hemiparesis  2.  Gait disorder  3.  Seizure disorder, well controlled  4.  Memory disorder  [name] will continue Joshua Climax for now, a prescription was sent in.  We will follow Joshua memory over time.  He appears to be fairly stable and doing well on Joshua Despard.  Jill Alexanders MD 01/27/2018 2:37 PM  [facility] [address] [place], [place] [address]  Phone [phone] Fax [phone]

## 2018-03-04 ENCOUNTER — Ambulatory Visit: Payer: Medicare Other | Attending: Internal Medicine | Admitting: Internal Medicine

## 2018-03-04 ENCOUNTER — Encounter: Payer: Self-pay | Admitting: Internal Medicine

## 2018-03-04 VITALS — BP 118/70 | HR 57 | Temp 98.2°F | Resp 16 | Wt 165.6 lb

## 2018-03-04 DIAGNOSIS — I69398 Other sequelae of cerebral infarction: Secondary | ICD-10-CM

## 2018-03-04 DIAGNOSIS — B182 Chronic viral hepatitis C: Secondary | ICD-10-CM | POA: Insufficient documentation

## 2018-03-04 DIAGNOSIS — I10 Essential (primary) hypertension: Secondary | ICD-10-CM | POA: Diagnosis not present

## 2018-03-04 DIAGNOSIS — G40909 Epilepsy, unspecified, not intractable, without status epilepticus: Secondary | ICD-10-CM | POA: Insufficient documentation

## 2018-03-04 DIAGNOSIS — R569 Unspecified convulsions: Secondary | ICD-10-CM | POA: Diagnosis not present

## 2018-03-04 DIAGNOSIS — Z87891 Personal history of nicotine dependence: Secondary | ICD-10-CM | POA: Insufficient documentation

## 2018-03-04 DIAGNOSIS — F329 Major depressive disorder, single episode, unspecified: Secondary | ICD-10-CM | POA: Insufficient documentation

## 2018-03-04 DIAGNOSIS — E785 Hyperlipidemia, unspecified: Secondary | ICD-10-CM | POA: Diagnosis not present

## 2018-03-04 DIAGNOSIS — I69359 Hemiplegia and hemiparesis following cerebral infarction affecting unspecified side: Secondary | ICD-10-CM | POA: Diagnosis not present

## 2018-03-04 DIAGNOSIS — Z79899 Other long term (current) drug therapy: Secondary | ICD-10-CM | POA: Diagnosis not present

## 2018-03-04 DIAGNOSIS — I69354 Hemiplegia and hemiparesis following cerebral infarction affecting left non-dominant side: Secondary | ICD-10-CM | POA: Insufficient documentation

## 2018-03-04 DIAGNOSIS — Z7982 Long term (current) use of aspirin: Secondary | ICD-10-CM | POA: Insufficient documentation

## 2018-03-04 NOTE — Patient Instructions (Signed)
Please restart the Aspirin.

## 2018-03-04 NOTE — Progress Notes (Signed)
Patient ID: [name], male    DOB: Feb 21, 1951  MRN: [id number]  CC: Hypertension   Subjective: [name] is a 67 y.o. male who presents for chronic ds management. Sister [name], is with him His concerns today include:  Patient with history of hepatitis C treated, seizure disorder, CVA with residual left-sided weakness uses a rolling walker, HTN, memory deficit (followed by neurology), tubular adenomas  1.  HTN: compliant with Metoprolol.  Sister does the cooking.  She tries to limit salt  2.  SZ/Hx of CVA: last sz was 1.5-2 yrs ago.  Compliant with Keppra. -sister had stop ASA after seeing something on the news "that ASA may not be good for you." -no falls.  Ambulates with his rollator walker Good appetite. Sleeping okay. He saw his neurologist Dr.  Margette Fast last mth  Patient Active Problem List   Diagnosis Date Noted  . Adenomatous polyp of colon 09/02/2017  . Functional urinary incontinence 09/02/2017  . Hemiparesis and alteration of sensations as late effects of stroke ([place]) 01/25/2017  . 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/23/2015  . HTN (hypertension) 06/20/2015  . 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  . Chronic hepatitis C without hepatic coma ([place]) 03/22/2015  . Hip fracture ([place]) 11/26/2013  . Seizure ([place]) 11/26/2013  . Memory disorder 03/04/2013  . Depressive disorder, not elsewhere classified 07/24/2012  . Abnormality of gait 07/24/2012  . Localization-related (focal) (partial) epilepsy and epileptic syndromes with complex partial seizures, without mention of intractable epilepsy 07/24/2012  . Cerebral thrombosis with cerebral infarction ([place]) 07/24/2012  . Lesion of ulnar nerve 07/24/2012     Current Outpatient Medications on File Prior to Visit  Medication Sig Dispense Refill  . aspirin 325 MG EC tablet Take 1 tablet (325 mg total) by mouth daily. 90 tablet 3  . levETIRAcetam (KEPPRA) 750 MG tablet Take 2 tablets (1,500 mg total) by mouth 2 (two) times daily.  360 tablet 3  . memantine (NAMENDA) 10 MG tablet Take 1 tablet (10 mg total) by mouth 2 (two) times daily. 180 tablet 3  . metoprolol succinate (TOPROL-XL) 25 MG 24 hr tablet Take 1 tablet (25 mg total) by mouth daily. Needs office visit for refills 90 tablet 1  . simvastatin (ZOCOR) 20 MG tablet Take 0.5 tablets (10 mg total) by mouth every evening. 90 tablet 5   No current facility-administered medications on file prior to visit.     No Known Allergies  Social History   Socioeconomic History  . Marital status: Divorced    Spouse name: Not on file  . Number of children: 0  . Years of education: 10  . Highest education level: Not on file  Occupational History  . Not on file  Social Needs  . Financial resource strain: Not on file  . Food insecurity:    Worry: Not on file    Inability: Not on file  . Transportation needs:    Medical: Not on file    Non-medical: Not on file  Tobacco Use  . Smoking status: Former Smoker    Packs/day: 3.00    Last attempt to quit: 01/02/2014    Years since quitting: 4.1  . Smokeless tobacco: Former Network engineer and Sexual Activity  . Alcohol use: No    Alcohol/week: 0.0 oz    Comment: Former Alcoholic  . Drug use: No  . Sexual activity: Yes  Lifestyle  . Physical activity:    Days per week: Not on file  Minutes per session: Not on file  . Stress: Not on file  Relationships  . Social connections:    Talks on phone: Not on file    Gets together: Not on file    Attends religious service: Not on file    Active member of club or organization: Not on file    Attends meetings of clubs or organizations: Not on file    Relationship status: Not on file  . Intimate partner violence:    Fear of current or ex partner: Not on file    Emotionally abused: Not on file    Physically abused: Not on file    Forced sexual activity: Not on file  Other Topics Concern  . Not on file  Social History Narrative   Patient is single and lives with a  friend and her son.   Patient does not have any children.   Patient is disabled.   Patient has a 10 th grade education.   Patient is left-handed.   Patient drinks some soda but not everyday.    Family History  Problem Relation Age of Onset  . Pulmonary embolism Mother   . Liver disease Father   . Diabetes Sister   . Hypertension Sister   . Hypertension Sister   . Hypertension Sister   . Seizures Neg Hx     Past Surgical History:  Procedure Laterality Date  . COLONOSCOPY WITH PROPOFOL N/A 07/10/2017   Procedure: COLONOSCOPY WITH PROPOFOL;  Surgeon: Otis Brace, MD;  Location: [place];  Service: Gastroenterology;  Laterality: N/A;  . ESOPHAGOGASTRODUODENOSCOPY (EGD) WITH PROPOFOL N/A 07/10/2017   Procedure: ESOPHAGOGASTRODUODENOSCOPY (EGD) WITH PROPOFOL;  Surgeon: Otis Brace, MD;  [facility];  Service: Gastroenterology;  Laterality: N/A;  . HIP ARTHROPLASTY Left 11/26/2013   Procedure: LEFT HIP HEMIARTHROPLASTY;  Surgeon: Mcarthur Rossetti, MD;  Location: [place];  Service: Orthopedics;  Laterality: Left;  . WRIST SURGERY Left 95 & 96    ROS: Review of Systems Neg except as above PHYSICAL EXAM: BP 118/70   Pulse (!) 57   Temp 98.2 F (36.8 C) (Oral)   Resp 16   Wt 165 lb 9.6 oz (75.1 kg)   SpO2 97%   BMI 24.45 kg/m   Wt Readings from Last 3 Encounters:  03/04/18 165 lb 9.6 oz (75.1 kg)  01/27/18 168 lb (76.2 kg)  12/12/17 172 lb (78 kg)    Physical Exam  General appearance - alert, well appearing, older AAM  and in no distress Mental status - pt oriented to person and placed Neck - supple, no significant adenopathy Chest - clear to auscultation, no wheezes, rales or rhonchi, symmetric air entry Heart - normal rate, regular rhythm, normal S1, S2, no murmurs, rubs, clicks or gallops Neurological - dec grip and prox/distal strength LUE Extremities -no LE edema   ASSESSMENT AND PLAN: 1. Essential hypertension At goal.  Continue  Metoprolol - CBC - Comprehensive metabolic panel  2. Dyslipidemia Tolerating Zocor - Lipid panel  3. Seizure ([place]) -controlled on Keppra  4. Hemiparesis and alteration of sensations as late effects of stroke ([place]) -stable Advised to restart ASA for secondary prevention  Patient was given the opportunity to ask questions.  Patient verbalized understanding of the plan and was able to repeat key elements of the plan.   Orders Placed This Encounter  Procedures  . CBC  . Comprehensive metabolic panel  . Lipid panel     Requested Prescriptions    No prescriptions requested  or ordered in this encounter    Return in about 3 months (around 06/04/2018).  Karle Plumber, MD, FACP

## 2018-03-05 LAB — LIPID PANEL
CHOL/HDL RATIO: 3.4 ratio (ref 0.0–5.0)
Cholesterol, Total: 110 mg/dL (ref 100–199)
HDL: 32 mg/dL — AB (ref >39)
LDL Calculated: 61 mg/dL (ref 0–99)
Triglycerides: 85 mg/dL (ref 0–149)
VLDL Cholesterol Cal: 17 mg/dL (ref 5–40)

## 2018-03-05 LAB — CBC
HEMATOCRIT: 43.6 % (ref 37.5–51.0)
Hemoglobin: 14.7 g/dL (ref 13.0–17.7)
MCH: 32.4 pg (ref 26.6–33.0)
MCHC: 33.7 g/dL (ref 31.5–35.7)
MCV: 96 fL (ref 79–97)
PLATELETS: 172 10*3/uL (ref 150–379)
RBC: 4.54 x10E6/uL (ref 4.14–5.80)
RDW: 13.2 % (ref 12.3–15.4)
WBC: 5.4 10*3/uL (ref 3.4–10.8)

## 2018-03-05 LAB — COMPREHENSIVE METABOLIC PANEL
A/G RATIO: 1.3 (ref 1.2–2.2)
ALBUMIN: 4.2 g/dL (ref 3.6–4.8)
ALT: 9 IU/L (ref 0–44)
AST: 14 IU/L (ref 0–40)
Alkaline Phosphatase: 99 IU/L (ref 39–117)
BILIRUBIN TOTAL: 0.5 mg/dL (ref 0.0–1.2)
BUN / CREAT RATIO: 14 (ref 10–24)
BUN: 11 mg/dL (ref 8–27)
CHLORIDE: 101 mmol/L (ref 96–106)
CO2: 23 mmol/L (ref 20–29)
Calcium: 9.3 mg/dL (ref 8.6–10.2)
Creatinine, Ser: 0.76 mg/dL (ref 0.76–1.27)
GFR, EST AFRICAN AMERICAN: 111 mL/min/{1.73_m2} (ref >59)
GFR, EST NON AFRICAN AMERICAN: 96 mL/min/{1.73_m2} (ref >59)
GLOBULIN, TOTAL: 3.2 g/dL (ref 1.5–4.5)
Glucose: 76 mg/dL (ref 65–99)
POTASSIUM: 4.5 mmol/L (ref 3.5–5.2)
SODIUM: 141 mmol/L (ref 134–144)
Total Protein: 7.4 g/dL (ref 6.0–8.5)

## 2018-03-06 ENCOUNTER — Telehealth: Payer: Self-pay

## 2018-03-06 NOTE — Telephone Encounter (Signed)
Contacted pt to go over lab results pt didn't answer left a detailed vm informing pt of results and if he has any questions or concerns to give me a call  If pt calls back please give results: blood count, kidney and LFTs and cholesterol levels are good.

## 2018-03-07 ENCOUNTER — Ambulatory Visit: Payer: Medicare Other | Admitting: Internal Medicine

## 2018-03-13 DIAGNOSIS — H2513 Age-related nuclear cataract, bilateral: Secondary | ICD-10-CM | POA: Diagnosis not present

## 2018-03-13 DIAGNOSIS — H353132 Nonexudative age-related macular degeneration, bilateral, intermediate dry stage: Secondary | ICD-10-CM | POA: Diagnosis not present

## 2018-03-13 DIAGNOSIS — H18423 Band keratopathy, bilateral: Secondary | ICD-10-CM | POA: Diagnosis not present

## 2018-03-21 ENCOUNTER — Telehealth: Payer: Self-pay

## 2018-03-21 NOTE — Telephone Encounter (Signed)
Patient sister call regarding his lab results   Patient sister was aware and understood results   No questions or concerns

## 2018-04-10 ENCOUNTER — Other Ambulatory Visit: Payer: Self-pay

## 2018-04-11 MED ORDER — MEMANTINE HCL 10 MG PO TABS: 10.0000 mg | ORAL_TABLET | Freq: Two times a day (BID) | ORAL | 3 refills | Status: DC

## 2018-04-17 DIAGNOSIS — H35361 Drusen (degenerative) of macula, right eye: Secondary | ICD-10-CM | POA: Diagnosis not present

## 2018-04-17 DIAGNOSIS — H353132 Nonexudative age-related macular degeneration, bilateral, intermediate dry stage: Secondary | ICD-10-CM | POA: Diagnosis not present

## 2018-04-17 DIAGNOSIS — H1711 Central corneal opacity, right eye: Secondary | ICD-10-CM | POA: Diagnosis not present

## 2018-04-17 DIAGNOSIS — H2513 Age-related nuclear cataract, bilateral: Secondary | ICD-10-CM | POA: Diagnosis not present

## 2018-05-23 ENCOUNTER — Other Ambulatory Visit: Payer: Self-pay

## 2018-05-23 DIAGNOSIS — E785 Hyperlipidemia, unspecified: Secondary | ICD-10-CM

## 2018-05-23 MED ORDER — SIMVASTATIN 20 MG PO TABS: 10.0000 mg | ORAL_TABLET | Freq: Every evening | ORAL | 0 refills | Status: DC

## 2018-06-05 ENCOUNTER — Ambulatory Visit: Payer: Medicare Other | Admitting: Internal Medicine

## 2018-06-10 ENCOUNTER — Ambulatory Visit: Payer: Medicare Other | Admitting: Internal Medicine

## 2018-07-10 ENCOUNTER — Ambulatory Visit: Payer: Medicare Other | Attending: Internal Medicine | Admitting: Internal Medicine

## 2018-07-10 VITALS — BP 131/77 | HR 71 | Temp 98.2°F | Resp 16 | Wt 168.0 lb

## 2018-07-10 DIAGNOSIS — R413 Other amnesia: Secondary | ICD-10-CM | POA: Diagnosis not present

## 2018-07-10 DIAGNOSIS — Z7982 Long term (current) use of aspirin: Secondary | ICD-10-CM | POA: Insufficient documentation

## 2018-07-10 DIAGNOSIS — F329 Major depressive disorder, single episode, unspecified: Secondary | ICD-10-CM | POA: Insufficient documentation

## 2018-07-10 DIAGNOSIS — R569 Unspecified convulsions: Secondary | ICD-10-CM | POA: Diagnosis not present

## 2018-07-10 DIAGNOSIS — I10 Essential (primary) hypertension: Secondary | ICD-10-CM | POA: Diagnosis not present

## 2018-07-10 DIAGNOSIS — Z23 Encounter for immunization: Secondary | ICD-10-CM | POA: Insufficient documentation

## 2018-07-10 DIAGNOSIS — B182 Chronic viral hepatitis C: Secondary | ICD-10-CM | POA: Diagnosis not present

## 2018-07-10 DIAGNOSIS — Z833 Family history of diabetes mellitus: Secondary | ICD-10-CM | POA: Diagnosis not present

## 2018-07-10 DIAGNOSIS — E785 Hyperlipidemia, unspecified: Secondary | ICD-10-CM | POA: Insufficient documentation

## 2018-07-10 DIAGNOSIS — Z87891 Personal history of nicotine dependence: Secondary | ICD-10-CM | POA: Diagnosis not present

## 2018-07-10 DIAGNOSIS — Z8249 Family history of ischemic heart disease and other diseases of the circulatory system: Secondary | ICD-10-CM | POA: Diagnosis not present

## 2018-07-10 DIAGNOSIS — Z9889 Other specified postprocedural states: Secondary | ICD-10-CM | POA: Insufficient documentation

## 2018-07-10 DIAGNOSIS — Z8673 Personal history of transient ischemic attack (TIA), and cerebral infarction without residual deficits: Secondary | ICD-10-CM | POA: Insufficient documentation

## 2018-07-10 DIAGNOSIS — Z79899 Other long term (current) drug therapy: Secondary | ICD-10-CM | POA: Insufficient documentation

## 2018-07-10 MED ORDER — PNEUMOCOCCAL 13-VAL CONJ VACC IM SUSP: 0.5000 mL | INTRAMUSCULAR | 0 refills | Status: AC

## 2018-07-10 NOTE — Patient Instructions (Addendum)
Influenza Virus Vaccine injection (Fluarix) What is this medicine? INFLUENZA VIRUS VACCINE (in floo EN zuh VAHY ruhs vak SEEN) helps to reduce the risk of getting influenza also known as the flu. This medicine may be used for other purposes; ask your health care provider or pharmacist if you have questions. COMMON BRAND NAME(S): Fluarix, Fluzone What should I tell my health care provider before I take this medicine? They need to know if you have any of these conditions: -bleeding disorder like hemophilia -fever or infection -Guillain-Barre syndrome or other neurological problems -immune system problems -infection with the human immunodeficiency virus (HIV) or AIDS -low blood platelet counts -multiple sclerosis -an unusual or allergic reaction to influenza virus vaccine, eggs, chicken proteins, latex, gentamicin, other medicines, foods, dyes or preservatives -pregnant or trying to get pregnant -breast-feeding How should I use this medicine? This vaccine is for injection into a muscle. It is given by a health care professional. A copy of Vaccine Information Statements will be given before each vaccination. Read this sheet carefully each time. The sheet may change frequently. Talk to your pediatrician regarding the use of this medicine in children. Special care may be needed. Overdosage: If you think you have taken too much of this medicine contact a poison control center or emergency room at once. NOTE: This medicine is only for you. Do not share this medicine with others. What if I miss a dose? This does not apply. What may interact with this medicine? -chemotherapy or radiation therapy -medicines that lower your immune system like etanercept, anakinra, infliximab, and adalimumab -medicines that treat or prevent blood clots like warfarin -phenytoin -steroid medicines like prednisone or cortisone -theophylline -vaccines This list may not describe all possible interactions. Give your  health care provider a list of all the medicines, herbs, non-prescription drugs, or dietary supplements you use. Also tell them if you smoke, drink alcohol, or use illegal drugs. Some items may interact with your medicine. What should I watch for while using this medicine? Report any side effects that do not go away within 3 days to your doctor or health care professional. Call your health care provider if any unusual symptoms occur within 6 weeks of receiving this vaccine. You may still catch the flu, but the illness is not usually as bad. You cannot get the flu from the vaccine. The vaccine will not protect against colds or other illnesses that may cause fever. The vaccine is needed every year. What side effects may I notice from receiving this medicine? Side effects that you should report to your doctor or health care professional as soon as possible: -allergic reactions like skin rash, itching or hives, swelling of the face, lips, or tongue Side effects that usually do not require medical attention (report to your doctor or health care professional if they continue or are bothersome): -fever -headache -muscle aches and pains -pain, tenderness, redness, or swelling at site where injected -weak or tired This list may not describe all possible side effects. Call your doctor for medical advice about side effects. You may report side effects to FDA at 1-800-FDA-1088. Where should I keep my medicine? This vaccine is only given in a clinic, pharmacy, doctor's office, or other health care setting and will not be stored at home. NOTE: This sheet is a summary. It may not cover all possible information. If you have questions about this medicine, talk to your doctor, pharmacist, or health care provider.  2018 Elsevier/Gold Standard (2008-05-05 09:30:40)   Pneumococcal Conjugate Vaccine (  PCV13) What You Need to Know 1. Why get vaccinated? Vaccination can protect both children and adults from  pneumococcal disease. Pneumococcal disease is caused by bacteria that can spread from person to person through close contact. It can cause ear infections, and it can also lead to more serious infections of the:  Lungs (pneumonia),  Blood (bacteremia), and  Covering of the brain and spinal cord (meningitis).  Pneumococcal pneumonia is most common among adults. Pneumococcal meningitis can cause deafness and brain damage, and it kills about 1 child in 10 who get it. Anyone can get pneumococcal disease, but children under 68 years of age and adults 32 years and older, people with certain medical conditions, and cigarette smokers are at the highest risk. Before there was a vaccine, the [place] saw:  more than 700 cases of meningitis,  about 13,000 blood infections,  about 5 million ear infections, and  about 200 deaths  in children under 5 each year from pneumococcal disease. Since vaccine became available, severe pneumococcal disease in these children has fallen by 88%. About 18,000 older adults die of pneumococcal disease each year in the [place]. Treatment of pneumococcal infections with penicillin and other drugs is not as effective as it used to be, because some strains of the disease have become resistant to these drugs. This makes prevention of the disease, through vaccination, even more important. 2. PCV13 vaccine Pneumococcal conjugate vaccine (called PCV13) protects against 13 types of pneumococcal bacteria. PCV13 is routinely given to children at 2, 4, 6, and 37-35 months of age. It is also recommended for children and adults 84 to 92 years of age with certain health conditions, and for all adults 40 years of age and older. Your doctor can give you details. 3. Some people should not get this vaccine Anyone who has ever had a life-threatening allergic reaction to a dose of this vaccine, to an earlier pneumococcal vaccine called PCV7, or to any vaccine containing diphtheria  toxoid (for example, DTaP), should not get PCV13. Anyone with a severe allergy to any component of PCV13 should not get the vaccine. Tell your doctor if the person being vaccinated has any severe allergies. If the person scheduled for vaccination is not feeling well, your healthcare provider might decide to reschedule the shot on another day. 4. Risks of a vaccine reaction With any medicine, including vaccines, there is a chance of reactions. These are usually mild and go away on their own, but serious reactions are also possible. Problems reported following PCV13 varied by age and dose in the series. The most common problems reported among children were:  About half became drowsy after the shot, had a temporary loss of appetite, or had redness or tenderness where the shot was given.  About 1 out of 3 had swelling where the shot was given.  About 1 out of 3 had a mild fever, and about 1 in 20 had a fever over 102.69F.  Up to about 8 out of 10 became fussy or irritable.  Adults have reported pain, redness, and swelling where the shot was given; also mild fever, fatigue, headache, chills, or muscle pain. [place] children who get PCV13 along with inactivated flu vaccine at the same time may be at increased risk for seizures caused by fever. Ask your doctor for more information. Problems that could happen after any vaccine:  People sometimes faint after a medical procedure, including vaccination. Sitting or lying down for about 15 minutes can help prevent fainting, and  injuries caused by a fall. Tell your doctor if you feel dizzy, or have vision changes or ringing in the ears.  Some older children and adults get severe pain in the shoulder and have difficulty moving the arm where a shot was given. This happens very rarely.  Any medication can cause a severe allergic reaction. Such reactions from a vaccine are very rare, estimated at about 1 in a million doses, and would happen within a few minutes  to a few hours after the vaccination. As with any medicine, there is a very small chance of a vaccine causing a serious injury or death. The safety of vaccines is always being monitored. For more information, visit: http://www.aguilar.org/ 5. What if there is a serious reaction? What should I look for? Look for anything that concerns you, such as signs of a severe allergic reaction, very high fever, or unusual behavior. Signs of a severe allergic reaction can include hives, swelling of the face and throat, difficulty breathing, a fast heartbeat, dizziness, and weakness-usually within a few minutes to a few hours after the vaccination. What should I do?  If you think it is a severe allergic reaction or other emergency that can't wait, call 9-1-1 or get the person to the nearest hospital. Otherwise, call your doctor.  Reactions should be reported to the Vaccine Adverse Event Reporting System (VAERS). Your doctor should file this report, or you can do it yourself through the VAERS web site at www.vaers.SamedayNews.es, or by calling 219-545-3810. ? VAERS does not give medical advice. 6. The National Vaccine Injury Compensation Program The [employer] Vaccine Injury Compensation Program (VICP) is a federal program that was created to compensate people who may have been injured by certain vaccines. Persons who believe they may have been injured by a vaccine can learn about the program and about filing a claim by calling 254 450 5482 or visiting the [place] website at GoldCloset.com.ee. There is a time limit to file a claim for compensation. 7. How can I learn more?  Ask your healthcare provider. He or she can give you the vaccine package insert or suggest other sources of information.  Call your local or state health department.  Contact the Centers for Disease Control and Prevention (CDC): ? Call 3054606078 (1-800-CDC-INFO) or ? Visit CDC's website at http://hunter.com/ Vaccine  Information Statement, PCV13 Vaccine (08/26/2014) This information is not intended to replace advice given to you by your health care provider. Make sure you discuss any questions you have with your health care provider. Document Released: 08/05/2006 Document Revised: 06/28/2016 Document Reviewed: 06/28/2016 Elsevier Interactive Patient Education  2017 [employer].

## 2018-07-10 NOTE — Progress Notes (Signed)
Patient ID: [name], male    DOB: 10/27/1950  MRN: [id number]  CC: Hypertension   Subjective: [name] is a 67 y.o. male who presents for chronic ds management.  Sister [name], is with him His concerns today include:  Pt with hx of hepatitis C treated, sz disorder, CVA with residual left-sided weakness uses a rolling walker, HTN, memory deficit (followed by neurology), tubular adenomas  SZ/memory deficit:  No sz since last visit.  Occasional confusion per his sister. Compliant with Keppra.  Tolerating Namenda No dizziness or HA Sister sets up med box for him.  Patient is able to feed himself.  His sister helps with his bath.  He is able to clothe himself.  He is left-handed and the stroke that he had in the past affected the left side.  He remains with some weakness in the left hand.  Denies any falls.  HTN:  Compliant with Metoprolol. Pt denies CP/SOB/LE   I discussed labs with him from last office visit.  Patient Active Problem List   Diagnosis Date Noted  . Adenomatous polyp of colon 09/02/2017  . Functional urinary incontinence 09/02/2017  . Hemiparesis and alteration of sensations as late effects of stroke ([place]) 01/25/2017  . 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/23/2015  . HTN (hypertension) 06/20/2015  . 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  . Chronic hepatitis C without hepatic coma ([place]) 03/22/2015  . Hip fracture ([place]) 11/26/2013  . Seizure ([place]) 11/26/2013  . Memory disorder 03/04/2013  . Depressive disorder, not elsewhere classified 07/24/2012  . Abnormality of gait 07/24/2012  . Localization-related (focal) (partial) epilepsy and epileptic syndromes with complex partial seizures, without mention of intractable epilepsy 07/24/2012  . Cerebral thrombosis with cerebral infarction ([place]) 07/24/2012  . Lesion of ulnar nerve 07/24/2012     Current Outpatient Medications on File Prior to Visit  Medication Sig Dispense Refill  . aspirin 325 MG EC tablet Take 1 tablet (325  mg total) by mouth daily. 90 tablet 3  . levETIRAcetam (KEPPRA) 750 MG tablet Take 2 tablets (1,500 mg total) by mouth 2 (two) times daily. 360 tablet 3  . memantine (NAMENDA) 10 MG tablet Take 1 tablet (10 mg total) by mouth 2 (two) times daily. 180 tablet 3  . metoprolol succinate (TOPROL-XL) 25 MG 24 hr tablet Take 1 tablet (25 mg total) by mouth daily. Needs office visit for refills 90 tablet 1  . simvastatin (ZOCOR) 20 MG tablet Take 0.5 tablets (10 mg total) by mouth every evening. 45 tablet 0   No current facility-administered medications on file prior to visit.     No Known Allergies  Social History   Socioeconomic History  . Marital status: Divorced    Spouse name: Not on file  . Number of children: 0  . Years of education: 10  . Highest education level: Not on file  Occupational History  . Not on file  Social Needs  . Financial resource strain: Not on file  . Food insecurity:    Worry: Not on file    Inability: Not on file  . Transportation needs:    Medical: Not on file    Non-medical: Not on file  Tobacco Use  . Smoking status: Former Smoker    Packs/day: 3.00    Last attempt to quit: 01/02/2014    Years since quitting: 4.5  . Smokeless tobacco: Former Network engineer and Sexual Activity  . Alcohol use: No    Alcohol/week: 0.0 standard drinks  Comment: Former Alcoholic  . Drug use: No  . Sexual activity: Yes  Lifestyle  . Physical activity:    Days per week: Not on file    Minutes per session: Not on file  . Stress: Not on file  Relationships  . Social connections:    Talks on phone: Not on file    Gets together: Not on file    Attends religious service: Not on file    Active member of club or organization: Not on file    Attends meetings of clubs or organizations: Not on file    Relationship status: Not on file  . Intimate partner violence:    Fear of current or ex partner: Not on file    Emotionally abused: Not on file    Physically abused: Not  on file    Forced sexual activity: Not on file  Other Topics Concern  . Not on file  Social History Narrative   Patient is single and lives with a friend and her son.   Patient does not have any children.   Patient is disabled.   Patient has a 10 th grade education.   Patient is left-handed.   Patient drinks some soda but not everyday.    Family History  Problem Relation Age of Onset  . Pulmonary embolism Mother   . Liver disease Father   . Diabetes Sister   . Hypertension Sister   . Hypertension Sister   . Hypertension Sister   . Seizures Neg Hx     Past Surgical History:  Procedure Laterality Date  . COLONOSCOPY WITH PROPOFOL N/A 07/10/2017   Procedure: COLONOSCOPY WITH PROPOFOL;  Surgeon: Otis Brace, MD;  Location: [facility];  Service: Gastroenterology;  Laterality: N/A;  . ESOPHAGOGASTRODUODENOSCOPY (EGD) WITH PROPOFOL N/A 07/10/2017   Procedure: ESOPHAGOGASTRODUODENOSCOPY (EGD) WITH PROPOFOL;  Surgeon: Otis Brace, MD;  [facility];  Service: Gastroenterology;  Laterality: N/A;  . HIP ARTHROPLASTY Left 11/26/2013   Procedure: LEFT HIP HEMIARTHROPLASTY;  Surgeon: Mcarthur Rossetti, MD;  Location: [facility];  Service: Orthopedics;  Laterality: Left;  . WRIST SURGERY Left 95 & 96    ROS: Review of Systems  Constitutional: Negative for activity change and appetite change.  Gastrointestinal: Negative for blood in stool.       Moving bowel okay  Genitourinary: Negative for difficulty urinating and hematuria.  Psychiatric/Behavioral: Negative for dysphoric mood and sleep disturbance. The patient is not hyperactive.     PHYSICAL EXAM: BP 131/77   Pulse 71   Temp 98.2 F (36.8 C) (Oral)   Resp 16   Wt 168 lb (76.2 kg)   SpO2 97%   BMI 24.81 kg/m   Wt Readings from Last 3 Encounters:  07/10/18 168 lb (76.2 kg)  03/04/18 165 lb 9.6 oz (75.1 kg)  01/27/18 168 lb (76.2 kg)    Physical Exam General appearance - alert, well appearing, and in  no distress Mental status -patient with flat affect.  He answers most questions appropriately.   Neck - supple, no significant adenopathy Chest - clear to auscultation, no wheezes, rales or rhonchi, symmetric air entry Heart - normal rate, regular rhythm, normal S1, S2, no murmurs, rubs, clicks or gallops Neurological -ambulates with a rolling walker.  Gait is slow but steady with it. Extremities - peripheral pulses normal, no pedal edema, no clubbing or cyanosis Results for orders placed or performed in visit on 03/04/18  CBC  Result Value Ref Range   WBC 5.4 3.4 -  10.8 x10E3/uL   RBC 4.54 4.14 - 5.80 x10E6/uL   Hemoglobin 14.7 13.0 - 17.7 g/dL   Hematocrit 43.6 37.5 - 51.0 %   MCV 96 79 - 97 fL   MCH 32.4 26.6 - 33.0 pg   MCHC 33.7 31.5 - 35.7 g/dL   RDW 13.2 12.3 - 15.4 %   Platelets 172 150 - 379 x10E3/uL  Comprehensive metabolic panel  Result Value Ref Range   Glucose 76 65 - 99 mg/dL   BUN 11 8 - 27 mg/dL   Creatinine, Ser 0.76 0.76 - 1.27 mg/dL   GFR calc non Af Amer 96 >59 mL/min/1.73   GFR calc Af Amer 111 >59 mL/min/1.73   BUN/Creatinine Ratio 14 10 - 24   Sodium 141 134 - 144 mmol/L   Potassium 4.5 3.5 - 5.2 mmol/L   Chloride 101 96 - 106 mmol/L   CO2 23 20 - 29 mmol/L   Calcium 9.3 8.6 - 10.2 mg/dL   Total Protein 7.4 6.0 - 8.5 g/dL   Albumin 4.2 3.6 - 4.8 g/dL   Globulin, Total 3.2 1.5 - 4.5 g/dL   Albumin/Globulin Ratio 1.3 1.2 - 2.2   Bilirubin Total 0.5 0.0 - 1.2 mg/dL   Alkaline Phosphatase 99 39 - 117 IU/L   AST 14 0 - 40 IU/L   ALT 9 0 - 44 IU/L  Lipid panel  Result Value Ref Range   Cholesterol, Total 110 100 - 199 mg/dL   Triglycerides 85 0 - 149 mg/dL   HDL 32 (L) >39 mg/dL   VLDL Cholesterol Cal 17 5 - 40 mg/dL   LDL Calculated 61 0 - 99 mg/dL   Chol/HDL Ratio 3.4 0.0 - 5.0 ratio     ASSESSMENT AND PLAN: 1. Essential hypertension At goal.  Continue current medication -metoprolol  2. Seizure ([place]) Stable on Keppra.  3. Memory  disorder Stable on Namenda.  Patient functions well at home with the assistance of his sister.  4. Dyslipidemia Continue Zocor. Patient was given the opportunity to ask questions.  Patient verbalized understanding of the plan and was able to repeat key elements of the plan.   5.  Need for influenza and Prevnar 13. Influenza vaccine given today.  Pharmacy to check with patient's insurance to see if it covers for the Prevnar 13.  No orders of the defined types were placed in this encounter.    Requested Prescriptions   Signed Prescriptions Disp Refills  . pneumococcal 13-valent conjugate vaccine (PREVNAR 13) SUSP injection 0.5 mL 0    Sig: Inject 0.5 mLs into the muscle tomorrow at 10 am for 1 dose.    No follow-ups on file.  Karle Plumber, MD, FACP

## 2018-07-14 ENCOUNTER — Other Ambulatory Visit: Payer: Self-pay | Admitting: Internal Medicine

## 2018-09-21 ENCOUNTER — Other Ambulatory Visit: Payer: Self-pay | Admitting: Family Medicine

## 2018-09-21 DIAGNOSIS — E785 Hyperlipidemia, unspecified: Secondary | ICD-10-CM

## 2018-10-02 DIAGNOSIS — H1711 Central corneal opacity, right eye: Secondary | ICD-10-CM | POA: Diagnosis not present

## 2018-10-02 DIAGNOSIS — H353132 Nonexudative age-related macular degeneration, bilateral, intermediate dry stage: Secondary | ICD-10-CM | POA: Diagnosis not present

## 2018-10-02 DIAGNOSIS — H18423 Band keratopathy, bilateral: Secondary | ICD-10-CM | POA: Diagnosis not present

## 2018-10-02 DIAGNOSIS — H2513 Age-related nuclear cataract, bilateral: Secondary | ICD-10-CM | POA: Diagnosis not present

## 2018-10-07 ENCOUNTER — Ambulatory Visit: Payer: Medicare Other | Admitting: Internal Medicine

## 2018-11-04 ENCOUNTER — Ambulatory Visit: Payer: Medicare Other | Admitting: Internal Medicine

## 2018-11-07 ENCOUNTER — Ambulatory Visit: Payer: Medicare Other | Admitting: Internal Medicine

## 2018-12-05 ENCOUNTER — Other Ambulatory Visit: Payer: Self-pay | Admitting: Internal Medicine

## 2018-12-08 ENCOUNTER — Telehealth: Payer: Self-pay | Admitting: Internal Medicine

## 2018-12-08 NOTE — Telephone Encounter (Signed)
1) Medication(s) Requested (by name): Metoprolol  2) Pharmacy of Choice: [facility]  [address]  3) Special Requests:   Approved medications will be sent to the pharmacy, we will reach out if there is an issue.  Requests made after 3pm may not be addressed until the following business day!  If a patient is unsure of the name of the medication(s) please note and ask patient to call back when they are able to provide all info, do not send to responsible party until all information is available!

## 2018-12-09 MED ORDER — METOPROLOL SUCCINATE ER 25 MG PO TB24: 25.0000 mg | ORAL_TABLET | Freq: Every day | ORAL | 0 refills | Status: DC

## 2018-12-22 ENCOUNTER — Other Ambulatory Visit: Payer: Self-pay | Admitting: Internal Medicine

## 2018-12-22 DIAGNOSIS — E785 Hyperlipidemia, unspecified: Secondary | ICD-10-CM

## 2018-12-23 ENCOUNTER — Ambulatory Visit: Payer: Medicare Other | Admitting: Internal Medicine

## 2018-12-30 ENCOUNTER — Ambulatory Visit: Payer: Medicare Other | Attending: Internal Medicine | Admitting: Internal Medicine

## 2018-12-30 ENCOUNTER — Encounter: Payer: Self-pay | Admitting: Internal Medicine

## 2018-12-30 VITALS — BP 120/80 | HR 71 | Temp 98.4°F | Resp 16 | Wt 175.0 lb

## 2018-12-30 DIAGNOSIS — I69398 Other sequelae of cerebral infarction: Secondary | ICD-10-CM

## 2018-12-30 DIAGNOSIS — Z833 Family history of diabetes mellitus: Secondary | ICD-10-CM | POA: Insufficient documentation

## 2018-12-30 DIAGNOSIS — I69359 Hemiplegia and hemiparesis following cerebral infarction affecting unspecified side: Secondary | ICD-10-CM | POA: Diagnosis not present

## 2018-12-30 DIAGNOSIS — Z8249 Family history of ischemic heart disease and other diseases of the circulatory system: Secondary | ICD-10-CM | POA: Insufficient documentation

## 2018-12-30 DIAGNOSIS — F329 Major depressive disorder, single episode, unspecified: Secondary | ICD-10-CM | POA: Diagnosis not present

## 2018-12-30 DIAGNOSIS — R569 Unspecified convulsions: Secondary | ICD-10-CM | POA: Insufficient documentation

## 2018-12-30 DIAGNOSIS — Z87891 Personal history of nicotine dependence: Secondary | ICD-10-CM | POA: Diagnosis not present

## 2018-12-30 DIAGNOSIS — Z7982 Long term (current) use of aspirin: Secondary | ICD-10-CM | POA: Insufficient documentation

## 2018-12-30 DIAGNOSIS — I10 Essential (primary) hypertension: Secondary | ICD-10-CM | POA: Diagnosis not present

## 2018-12-30 DIAGNOSIS — Z79899 Other long term (current) drug therapy: Secondary | ICD-10-CM | POA: Diagnosis not present

## 2018-12-30 NOTE — Progress Notes (Signed)
Patient ID: [name], male    DOB: [birth year]  MRN: [id number]  CC: Hypertension   Subjective: [name] is a 68 y.o. male who presents for chronic ds management.  Sister is with him His concerns today include:  Pt with hx of hepatitis C treated, sz disorder, CVA with residual left-sided weakness uses a rolling walker, HTN, memory deficit (followed by neurology), tubular adenomas  HTN:  Compliant with Metoprolol No device to check blood pressure. Limits salt in the foods.  He denies any chest pains or shortness of breath.  No lower extremity edema.  No chronic headaches.  Sz disorder: will see Dr. Jannifer Franklin next mth.  No sz on his last visit.  He is compliant with Keppra. He has chronic weakness on the left side from previous CVA.  No falls.  He ambulates with Rollator walker.  HL:  Compliant with Zocor   Patient Active Problem List   Diagnosis Date Noted  . Adenomatous polyp of colon 09/02/2017  . Functional urinary incontinence 09/02/2017  . Hemiparesis and alteration of sensations as late effects of stroke ([place]) 01/25/2017  . 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/23/2015  . HTN (hypertension) 06/20/2015  . 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  . Hip fracture ([place]) 11/26/2013  . Seizure ([place]) 11/26/2013  . Memory disorder 03/04/2013  . Depressive disorder, not elsewhere classified 07/24/2012  . Abnormality of gait 07/24/2012  . Localization-related (focal) (partial) epilepsy and epileptic syndromes with complex partial seizures, without mention of intractable epilepsy 07/24/2012  . Cerebral thrombosis with cerebral infarction ([place]) 07/24/2012  . Lesion of ulnar nerve 07/24/2012     Current Outpatient Medications on File Prior to Visit  Medication Sig Dispense Refill  . aspirin 325 MG EC tablet Take 1 tablet (325 mg total) by mouth daily. 90 tablet 3  . levETIRAcetam (KEPPRA) 750 MG tablet Take 2 tablets (1,500 mg total) by mouth 2 (two) times daily. 360 tablet 3  . memantine  (NAMENDA) 10 MG tablet Take 1 tablet (10 mg total) by mouth 2 (two) times daily. 180 tablet 3  . metoprolol succinate (TOPROL-XL) 25 MG 24 hr tablet Take 1 tablet (25 mg total) by mouth daily. Needs office visit for refills 30 tablet 0  . simvastatin (ZOCOR) 20 MG tablet Take 0.5 tablet (10 mg) po every evening. Must keep upcoming appt for refills. 4 tablet 0   No current facility-administered medications on file prior to visit.     No Known Allergies  Social History   Socioeconomic History  . Marital status: Divorced    Spouse name: Not on file  . Number of children: 0  . Years of education: 10  . Highest education level: Not on file  Occupational History  . Not on file  Social Needs  . Financial resource strain: Not on file  . Food insecurity:    Worry: Not on file    Inability: Not on file  . Transportation needs:    Medical: Not on file    Non-medical: Not on file  Tobacco Use  . Smoking status: Former Smoker    Packs/day: 3.00    Last attempt to quit: 01/02/2014    Years since quitting: 4.9  . Smokeless tobacco: Former Network engineer and Sexual Activity  . Alcohol use: No    Alcohol/week: 0.0 standard drinks    Comment: Former Alcoholic  . Drug use: No  . Sexual activity: Yes  Lifestyle  . Physical activity:    Days per  week: Not on file    Minutes per session: Not on file  . Stress: Not on file  Relationships  . Social connections:    Talks on phone: Not on file    Gets together: Not on file    Attends religious service: Not on file    Active member of club or organization: Not on file    Attends meetings of clubs or organizations: Not on file    Relationship status: Not on file  . Intimate partner violence:    Fear of current or ex partner: Not on file    Emotionally abused: Not on file    Physically abused: Not on file    Forced sexual activity: Not on file  Other Topics Concern  . Not on file  Social History Narrative   Patient is single and lives  with a friend and her son.   Patient does not have any children.   Patient is disabled.   Patient has a 10 th grade education.   Patient is left-handed.   Patient drinks some soda but not everyday.    Family History  Problem Relation Age of Onset  . Pulmonary embolism Mother   . Liver disease Father   . Diabetes Sister   . Hypertension Sister   . Hypertension Sister   . Hypertension Sister   . Seizures Neg Hx     Past Surgical History:  Procedure Laterality Date  . COLONOSCOPY WITH PROPOFOL N/A 07/10/2017   Procedure: COLONOSCOPY WITH PROPOFOL;  Surgeon: Otis Brace, MD;  Location: [facility];  Service: Gastroenterology;  Laterality: N/A;  . ESOPHAGOGASTRODUODENOSCOPY (EGD) WITH PROPOFOL N/A 07/10/2017   Procedure: ESOPHAGOGASTRODUODENOSCOPY (EGD) WITH PROPOFOL;  Surgeon: Otis Brace, MD;  [facility];  Service: Gastroenterology;  Laterality: N/A;  . HIP ARTHROPLASTY Left 11/26/2013   Procedure: LEFT HIP HEMIARTHROPLASTY;  Surgeon: Mcarthur Rossetti, MD;  Location: [facility];  Service: Orthopedics;  Laterality: Left;  . WRIST SURGERY Left 95 & 96    ROS: Review of Systems Negative except as stated above  PHYSICAL EXAM: BP 120/80   Pulse 71   Temp 98.4 F (36.9 C) (Oral)   Resp 16   Wt 175 lb (79.4 kg)   SpO2 96%   BMI 25.84 kg/m   Physical Exam General appearance - alert, well appearing, and in no distress Mental status - normal mood, behavior, speech, dress, motor activity, and thought processes Neck - supple, no significant adenopathy Chest - clear to auscultation, no wheezes, rales or rhonchi, symmetric air entry Heart - normal rate, regular rhythm, normal S1, S2, no murmurs, rubs, clicks or gallops Extremities -no lower extremity edema. Neuro: Patient ambulates with Rollator walker.  CMP Latest Ref Rng & Units 03/04/2018 04/08/2017 03/13/2017  Glucose 65 - 99 mg/dL 76 84 110(H)  BUN 8 - 27 mg/dL 11 13 18   Creatinine 0.76 - 1.27 mg/dL 0.76  0.93 1.12  Sodium 134 - 144 mmol/L 141 139 138  Potassium 3.5 - 5.2 mmol/L 4.5 4.0 4.0  Chloride 96 - 106 mmol/L 101 100 94(L)  CO2 20 - 29 mmol/L 23 28 28   Calcium 8.6 - 10.2 mg/dL 9.3 9.5 10.2  Total Protein 6.0 - 8.5 g/dL 7.4 7.1 -  Total Bilirubin 0.0 - 1.2 mg/dL 0.5 0.5 -  Alkaline Phos 39 - 117 IU/L 99 99 -  AST 0 - 40 IU/L 14 13 -  ALT 0 - 44 IU/L 9 9 -   Lipid Panel  Component Value Date/Time   CHOL 110 03/04/2018 1507   TRIG 85 03/04/2018 1507   HDL 32 (L) 03/04/2018 1507   CHOLHDL 3.4 03/04/2018 1507   CHOLHDL 2.5 05/29/2016 0943   VLDL 16 05/29/2016 0943   LDLCALC 61 03/04/2018 1507    CBC    Component Value Date/Time   WBC 5.4 03/04/2018 1507   WBC 5.9 05/23/2016 1117   RBC 4.54 03/04/2018 1507   RBC 4.32 05/23/2016 1117   HGB 14.7 03/04/2018 1507   HCT 43.6 03/04/2018 1507   PLT 172 03/04/2018 1507   MCV 96 03/04/2018 1507   MCH 32.4 03/04/2018 1507   MCH 32.2 05/23/2016 1117   MCHC 33.7 03/04/2018 1507   MCHC 33.9 05/23/2016 1117   RDW 13.2 03/04/2018 1507   LYMPHSABS 1.8 03/13/2017 1039   MONOABS 531 05/23/2016 1117   EOSABS 0.1 03/13/2017 1039   BASOSABS 0.0 03/13/2017 1039    ASSESSMENT AND PLAN: 1. Essential hypertension At goal.  Continue current medications and low-salt diet.  2. Seizure ([place]) Controlled on Keppra.  3. Hemiparesis and alteration of sensations as late effects of stroke ([facility]) Continue aspirin, Zocor and good blood pressure control.  Patient was given the opportunity to ask questions.  Patient verbalized understanding of the plan and was able to repeat key elements of the plan.   No orders of the defined types were placed in this encounter.    Requested Prescriptions    No prescriptions requested or ordered in this encounter    Return in about 4 months (around 05/01/2019).  Karle Plumber, MD, FACP

## 2019-01-09 ENCOUNTER — Other Ambulatory Visit: Payer: Self-pay | Admitting: Internal Medicine

## 2019-01-24 ENCOUNTER — Other Ambulatory Visit: Payer: Self-pay | Admitting: Internal Medicine

## 2019-01-24 DIAGNOSIS — E785 Hyperlipidemia, unspecified: Secondary | ICD-10-CM

## 2019-01-26 ENCOUNTER — Telehealth: Payer: Self-pay | Admitting: Neurology

## 2019-01-26 NOTE — Telephone Encounter (Signed)
I called the patient to confirm his appointment on April 9 at 215.  I talked with his sister and nephew who he lives with. They agreed to virtual visit.  His nephew, [name], will set the video up.

## 2019-01-28 NOTE — Progress Notes (Signed)
Virtual Visit via Video Note  I connected with [name] on 01/28/19 at  2:15 PM EDT by a video enabled telemedicine application and verified that I am speaking with the correct person using two identifiers.   I discussed the limitations of evaluation and management by telemedicine and the availability of in person appointments. The patient expressed understanding and agreed to proceed.  History of Present Illness: 01/29/2019 SS: [name] is a 68 year old male with history of seizures, memory disorder.  His MMSE in April 2019 was 11/30.  He has history of cerebrovascular disease associated with a left hemiparesis.  He is currently taking Keppra 750 mg 2 tablets twice daily.  He remains on Namenda 10 mg twice daily.  He currently lives at home with his sister.  She reports he has been having more seizures.  She describes his seizures as occurring every 2 weeks, noted to be staring off, lasting a few seconds, subsiding.  There has not been any generalized shaking, postictal description.  She reports she manages his medications, he has not missed any doses.  His nephew describes a seizure event that occurred when he was lying flat on his back, stared off, began to draw up, and then then turned him to his side, the event subsided.  Apparently this is been going on for several months.  Apparently this is his typical seizure.  He does not drive a car, he does walk with a walker, denies any falls.  He is able to dress himself, bathe himself.  He is able to tell me his name, birthday, confirms some details of the episodes.  01/27/2018 Dr. Jannifer Franklin: [name] is a 68 year old left-handed black male with a history of cerebrovascular disease associated with a left hemiparesis.  The patient has had a history of seizures, he has not had any seizures since last seen 1 year ago.  The patient remains on Keppra and he seems to tolerate the medication quite well.  The patient uses a walker for ambulation, he denies any  recent falls.  He does have a memory problem, there have been no significant changes in his cognitive functioning level since last seen, he remains on Namenda.  The patient returns to the office today for an evaluation.   Observations/Objective: Alert to self, speech is clear, gait is assisted with walker  Assessment and Plan: 1.  Seizures  He is currently taking Keppra 750 mg tablets, 2 tablets twice daily.  He is tolerating the medication.  It sounds as though he has been having a recurrence of seizure events.  These are occurring every few weeks.  They are described as staring off episodes.  He is not sure when his last seizure event occurred, in the past has been on Dilantin.  After reviewing the record it appears his last seizure was in March 2017.  I talked with Dr. Jannifer Franklin we will restart another seizure medication.  He will continue taking Keppra. His sister did not want him to restart Dilantin because of how he acted when he was on the medication.  We will start carbamazepine (Tegretol-XR) 100 mg 12 hour tablet, taking 100 mg twice daily.  His sister would like to have his lab work checked.  I will order lab work in the future, she will come in 3 to 4 weeks to have it checked.  I will get him set up for a revisit in about 4 weeks.  We discussed side effects of the medication, she will let us  know of any problems or recurrent seizures.  I advised her that if his symptoms worsen or if he develops any new symptoms he should let us know.  During this visit we did not reassess a memory test.  Follow Up Instructions: 4-6 weeks for revisit   I discussed the assessment and treatment plan with the patient. The patient was provided an opportunity to ask questions and all were answered. The patient agreed with the plan and demonstrated an understanding of the instructions.   The patient was advised to call back or seek an in-person evaluation if the symptoms worsen or if the condition fails to improve as  anticipated.  I provided 30 minutes of non-face-to-face time during this encounter.  Evangeline Dakin, DNP  [facility] [address], [place], [place] [address] [phone]

## 2019-01-29 ENCOUNTER — Other Ambulatory Visit: Payer: Self-pay

## 2019-01-29 ENCOUNTER — Ambulatory Visit: Payer: Medicare Other | Admitting: Adult Health

## 2019-01-29 ENCOUNTER — Ambulatory Visit (INDEPENDENT_AMBULATORY_CARE_PROVIDER_SITE_OTHER): Payer: Medicare Other | Admitting: Neurology

## 2019-01-29 ENCOUNTER — Encounter: Payer: Self-pay | Admitting: Neurology

## 2019-01-29 DIAGNOSIS — R569 Unspecified convulsions: Secondary | ICD-10-CM | POA: Diagnosis not present

## 2019-01-29 MED ORDER — LEVETIRACETAM 750 MG PO TABS: 1500.0000 mg | ORAL_TABLET | Freq: Two times a day (BID) | ORAL | 3 refills | Status: DC

## 2019-01-29 MED ORDER — CARBAMAZEPINE ER 100 MG PO TB12: 100.0000 mg | ORAL_TABLET | Freq: Two times a day (BID) | ORAL | 5 refills | Status: DC

## 2019-01-29 NOTE — Progress Notes (Signed)
I have read the note, and I agree with the clinical assessment and plan.  Florentino Laabs K Brogen Duell   

## 2019-02-02 ENCOUNTER — Telehealth: Payer: Self-pay | Admitting: Neurology

## 2019-02-02 DIAGNOSIS — R569 Unspecified convulsions: Secondary | ICD-10-CM

## 2019-02-02 NOTE — Telephone Encounter (Signed)
Pts sister [name] is calling in wanting to stop carbamazepine (TEGRETOL-XR) 100 MG 12 hr tablet I asked why she states because she doesn't want him on because what she read about it

## 2019-02-02 NOTE — Telephone Encounter (Signed)
Left a vm for pt's sister to return my call.

## 2019-02-03 ENCOUNTER — Telehealth: Payer: Self-pay | Admitting: Internal Medicine

## 2019-02-03 NOTE — Telephone Encounter (Signed)
Patient's sister [name] (ok per dpr) returned my call. She states Mr. Schooley has decided not to start the carbamazepine.  She states after the virtual video visit with SS, NP on 01/28/19 pt read about the side effects and would prefer to restart dilantin and sister is in agreement. I inquired if the pt had a concern about a particular side effect and she stated he did not. Sister reports the pt is not comfortable starting the Carbamazepine and would prefer the Dilantin because he has taken in the past.   I advised the pt's sister I would send message to [facility], NP to review and advise. She was agreeable.

## 2019-02-04 ENCOUNTER — Ambulatory Visit: Payer: Medicare Other | Attending: Family Medicine | Admitting: Physician Assistant

## 2019-02-04 ENCOUNTER — Encounter: Payer: Self-pay | Admitting: Physician Assistant

## 2019-02-04 ENCOUNTER — Other Ambulatory Visit: Payer: Self-pay

## 2019-02-04 DIAGNOSIS — I10 Essential (primary) hypertension: Secondary | ICD-10-CM | POA: Diagnosis not present

## 2019-02-04 DIAGNOSIS — E785 Hyperlipidemia, unspecified: Secondary | ICD-10-CM | POA: Diagnosis not present

## 2019-02-04 MED ORDER — METOPROLOL SUCCINATE ER 25 MG PO TB24: 25.0000 mg | ORAL_TABLET | Freq: Every day | ORAL | 2 refills | Status: DC

## 2019-02-04 MED ORDER — PHENYTOIN SODIUM EXTENDED 100 MG PO CAPS: 300.0000 mg | ORAL_CAPSULE | Freq: Every day | ORAL | 3 refills | Status: DC

## 2019-02-04 MED ORDER — SIMVASTATIN 20 MG PO TABS: 20.0000 mg | ORAL_TABLET | Freq: Every day | ORAL | 2 refills | Status: DC

## 2019-02-04 NOTE — Telephone Encounter (Signed)
I have tried calling his sister, [name] twice today. First, I spoke with her son. He was going to ask her to return my call. I just called again, no answer. We will start him on Dilantin ER 300 mg at bedtime, recheck dilantin blood level in 3 weeks. I will place the orders now. He should not start taking carbamazepine. He will continue taking Keppra.   3:15 pm I spoke with his sister, we discussed starting the Dilantin ER 30 mg at bedtime, I will order the Dilantin level to be rechecked.  She will bring him in the office in 3 weeks to have labs checked.  She verbalized understanding.

## 2019-02-04 NOTE — Progress Notes (Signed)
Medications RF: simvastatin

## 2019-02-04 NOTE — Telephone Encounter (Signed)
I called the patient and spoke with his sister, [name]. We will start Dilantin ER 100 mg tablets, take 3 tablets at bedtime. We will recheck a level in 3 weeks with the rest of the blood work. He will not start the carbamazepine. I will send in a prescription.

## 2019-02-04 NOTE — Progress Notes (Signed)
Patient ID: [name], male   DOB: 1950-11-13, 68 y.o.   MRN: [id number] Virtual Visit via Telephone Note  I connected with [name] on 02/04/19 at 10:30 AM EDT by telephone and verified that I am speaking with the correct person using two identifiers.   I discussed the limitations, risks, security and privacy concerns of performing an evaluation and management service by telephone and the availability of in person appointments. I also discussed with the patient that there may be a patient responsible charge related to this service. The patient expressed understanding and agreed to proceed.   History of Present Illness:  His sister is on the phone to broker communication since his speech is impaired.  She accompanies him to all in person visit.  He needs a RF on Simvistatin.  No muscle aches or complaints.     Observations/Objective:   Assessment and Plan: 1. Dyslipidemia - simvastatin (ZOCOR) 20 MG tablet; Take 1 tablet (20 mg total) by mouth daily at 6 PM.  2. Essential hypertension - metoprolol succinate (TOPROL-XL) 25 MG 24 hr tablet; Take 1 tablet (25 mg total) by mouth daily.    Follow Up Instructions: In July as scheduled   I discussed the assessment and treatment plan with the patient. The patient was provided an opportunity to ask questions and all were answered. The patient agreed with the plan and demonstrated an understanding of the instructions.   The patient was advised to call back or seek an in-person evaluation if the symptoms worsen or if the condition fails to improve as anticipated.  I provided 5 minutes of non-face-to-face time during this encounter.   Freeman Caldron, PA-C

## 2019-02-09 NOTE — Addendum Note (Signed)
Addended by: Suzzanne Cloud on: 02/09/2019 10:20 AM   Modules accepted: Orders

## 2019-02-25 ENCOUNTER — Other Ambulatory Visit: Payer: Self-pay

## 2019-02-25 ENCOUNTER — Other Ambulatory Visit (INDEPENDENT_AMBULATORY_CARE_PROVIDER_SITE_OTHER): Payer: Self-pay

## 2019-02-25 DIAGNOSIS — Z0289 Encounter for other administrative examinations: Secondary | ICD-10-CM

## 2019-02-25 DIAGNOSIS — R569 Unspecified convulsions: Secondary | ICD-10-CM | POA: Diagnosis not present

## 2019-02-26 ENCOUNTER — Telehealth: Payer: Self-pay | Admitting: Neurology

## 2019-02-26 DIAGNOSIS — R569 Unspecified convulsions: Secondary | ICD-10-CM

## 2019-02-26 LAB — COMPREHENSIVE METABOLIC PANEL WITH GFR
ALT: 17 IU/L (ref 0–44)
AST: 22 IU/L (ref 0–40)
Albumin/Globulin Ratio: 1.6 (ref 1.2–2.2)
Albumin: 4.7 g/dL (ref 3.8–4.8)
Alkaline Phosphatase: 130 IU/L — ABNORMAL HIGH (ref 39–117)
BUN/Creatinine Ratio: 11 (ref 10–24)
BUN: 11 mg/dL (ref 8–27)
Bilirubin Total: 0.2 mg/dL (ref 0.0–1.2)
CO2: 22 mmol/L (ref 20–29)
Calcium: 9.9 mg/dL (ref 8.6–10.2)
Chloride: 101 mmol/L (ref 96–106)
Creatinine, Ser: 1 mg/dL (ref 0.76–1.27)
GFR calc Af Amer: 90 mL/min/1.73
GFR calc non Af Amer: 78 mL/min/1.73
Globulin, Total: 2.9 g/dL (ref 1.5–4.5)
Glucose: 96 mg/dL (ref 65–99)
Potassium: 4.7 mmol/L (ref 3.5–5.2)
Sodium: 141 mmol/L (ref 134–144)
Total Protein: 7.6 g/dL (ref 6.0–8.5)

## 2019-02-26 LAB — CBC WITH DIFFERENTIAL/PLATELET
Basophils Absolute: 0 10*3/uL (ref 0.0–0.2)
Basos: 0 %
EOS (ABSOLUTE): 0.2 10*3/uL (ref 0.0–0.4)
Eos: 3 %
Hematocrit: 43.5 % (ref 37.5–51.0)
Hemoglobin: 15.1 g/dL (ref 13.0–17.7)
Immature Grans (Abs): 0 10*3/uL (ref 0.0–0.1)
Immature Granulocytes: 0 %
Lymphocytes Absolute: 1.5 10*3/uL (ref 0.7–3.1)
Lymphs: 29 %
MCH: 32.8 pg (ref 26.6–33.0)
MCHC: 34.7 g/dL (ref 31.5–35.7)
MCV: 94 fL (ref 79–97)
Monocytes Absolute: 0.6 10*3/uL (ref 0.1–0.9)
Monocytes: 12 %
Neutrophils Absolute: 2.8 10*3/uL (ref 1.4–7.0)
Neutrophils: 56 %
Platelets: 200 10*3/uL (ref 150–450)
RBC: 4.61 x10E6/uL (ref 4.14–5.80)
RDW: 12 % (ref 11.6–15.4)
WBC: 5.1 10*3/uL (ref 3.4–10.8)

## 2019-02-26 LAB — PHENYTOIN LEVEL, TOTAL: Phenytoin (Dilantin), Serum: 25.1 ug/mL (ref 10.0–20.0)

## 2019-02-26 MED ORDER — PHENYTOIN 50 MG PO CHEW: CHEWABLE_TABLET | ORAL | 3 refills | Status: DC

## 2019-02-26 NOTE — Telephone Encounter (Signed)
I called the patient and spoke with his Sister [name].  His Dilantin level came back elevated at 25.1.  He is currently taking Dilantin ER 100 mg capsules, 3 capsules at bedtime.  We will decrease his dose to 250 mg.  I will send in a prescription for Dilantin 50 mg tablets.  He will start taking Dilantin ER 100 mg capsules, 2 capsules at bedtime with one 50 mg Dilantin tablet.  He will come back in the office in 3 weeks to recheck a Dilantin level.  She indicates that he has been doing well, has not had any seizures.  His alkaline phosphatase was mildly elevated at 130.

## 2019-02-26 NOTE — Telephone Encounter (Signed)
Agree with plan delineated.

## 2019-03-05 ENCOUNTER — Telehealth: Payer: Self-pay | Admitting: *Deleted

## 2019-03-05 NOTE — Telephone Encounter (Signed)
Calling pt for OV, when did you want to see?  He will be coming in for lab in 6/20

## 2019-03-05 NOTE — Telephone Encounter (Signed)
I talked to him on 02/26/2019. We cut back his dose of Dilantin. Was going to recheck a level 3 weeks from then. Can he come the 1st week of June. Anytime. We can check a level then.

## 2019-03-23 ENCOUNTER — Other Ambulatory Visit (INDEPENDENT_AMBULATORY_CARE_PROVIDER_SITE_OTHER): Payer: Self-pay

## 2019-03-23 ENCOUNTER — Other Ambulatory Visit: Payer: Self-pay

## 2019-03-23 DIAGNOSIS — R569 Unspecified convulsions: Secondary | ICD-10-CM

## 2019-03-23 DIAGNOSIS — Z0289 Encounter for other administrative examinations: Secondary | ICD-10-CM

## 2019-03-24 ENCOUNTER — Telehealth: Payer: Self-pay | Admitting: Neurology

## 2019-03-24 DIAGNOSIS — R569 Unspecified convulsions: Secondary | ICD-10-CM

## 2019-03-24 LAB — COMPREHENSIVE METABOLIC PANEL
ALT: 15 IU/L (ref 0–44)
AST: 17 IU/L (ref 0–40)
Albumin/Globulin Ratio: 1.8 (ref 1.2–2.2)
Albumin: 4.5 g/dL (ref 3.8–4.8)
Alkaline Phosphatase: 143 IU/L — ABNORMAL HIGH (ref 39–117)
BUN/Creatinine Ratio: 12 (ref 10–24)
BUN: 10 mg/dL (ref 8–27)
Bilirubin Total: 0.3 mg/dL (ref 0.0–1.2)
CO2: 24 mmol/L (ref 20–29)
Calcium: 9.3 mg/dL (ref 8.6–10.2)
Chloride: 96 mmol/L (ref 96–106)
Creatinine, Ser: 0.86 mg/dL (ref 0.76–1.27)
GFR calc Af Amer: 104 mL/min/{1.73_m2} (ref >59)
GFR calc non Af Amer: 90 mL/min/{1.73_m2} (ref >59)
Globulin, Total: 2.5 g/dL (ref 1.5–4.5)
Glucose: 88 mg/dL (ref 65–99)
Potassium: 4.5 mmol/L (ref 3.5–5.2)
Sodium: 140 mmol/L (ref 134–144)
Total Protein: 7 g/dL (ref 6.0–8.5)

## 2019-03-24 LAB — PHENYTOIN LEVEL, TOTAL: Phenytoin (Dilantin), Serum: 22.7 ug/mL (ref 10.0–20.0)

## 2019-03-24 NOTE — Telephone Encounter (Signed)
LMVM for pt to return call re: lab results and dosing instructions.

## 2019-03-24 NOTE — Telephone Encounter (Signed)
Please call the patient regarding his Dilantin dose. He is currently taking total 250 mg. His level came back elevated at 22.7. Have him decrease his dose to 200 mg at bedtime, that would be 2-100 mg ER capsules at bedtime. He will STOP the 50 mg tablets. He should come back in the office in 3 weeks to have a dilantin level rechecked. I will go ahead and place the order.   He should now be taking Dilantin 100 mg ER capsules, take 2 capsules at bedtime.

## 2019-03-25 NOTE — Telephone Encounter (Signed)
LMVM home, mobile not connecting.  To call for for recommendations and lab results.  [name] sister returned call.   I gave her the instructions that dilantin level 22.7.  Still elevated.  Decrease dose to 200mg  po qhs (using 100mg  caps (take 2), stop the 50mg  capsule.  She verbalized understanding. He will come in last week in June to have level redrawn.   She verbalized understanding.

## 2019-03-25 NOTE — Telephone Encounter (Signed)
LMVM for pt to return call for lab results and medication change recommendations.

## 2019-04-15 ENCOUNTER — Other Ambulatory Visit: Payer: Self-pay

## 2019-04-15 ENCOUNTER — Other Ambulatory Visit (INDEPENDENT_AMBULATORY_CARE_PROVIDER_SITE_OTHER): Payer: Self-pay

## 2019-04-15 DIAGNOSIS — R569 Unspecified convulsions: Secondary | ICD-10-CM

## 2019-04-15 DIAGNOSIS — Z0289 Encounter for other administrative examinations: Secondary | ICD-10-CM

## 2019-04-16 ENCOUNTER — Telehealth: Payer: Self-pay | Admitting: Neurology

## 2019-04-16 DIAGNOSIS — R569 Unspecified convulsions: Secondary | ICD-10-CM

## 2019-04-16 LAB — PHENYTOIN LEVEL, TOTAL: Phenytoin (Dilantin), Serum: 9.7 ug/mL — ABNORMAL LOW (ref 10.0–20.0)

## 2019-04-16 NOTE — Telephone Encounter (Signed)
Please call the patient. His Dilantin level is low (9.7). He is currently taking 200 mg at bedtime. We will have him increase his dose to 225 mg at bedtime. Have him continue taking 2-100 mg tablets, along with 1/2 of a 50 mg tablet to equal=225 mg. He should still have some of his 50 mg Dilantin tablets, to be able to take 1/2 tablet (25 mg).  He should come back in 3 weeks to have another level rechecked. Please verify if he has had anymore seizure events. I will go ahead and order the level.

## 2019-04-16 NOTE — Telephone Encounter (Addendum)
I called pt is not available.  No DPR.  Spoke to nephew, Penni Homans and relayed instructions to him re: level slightly low, at 9.7.  Increase to 225mg  po qhs (2 100mg  tablets and 1/2 tab of 50mg ). Want repeat dilantin level in 3 wks.  He verbalized understanding. Will call back if questions. He stated that pt has had few mild sz about 2 months ago.

## 2019-04-17 DIAGNOSIS — H2513 Age-related nuclear cataract, bilateral: Secondary | ICD-10-CM | POA: Diagnosis not present

## 2019-04-17 DIAGNOSIS — H18423 Band keratopathy, bilateral: Secondary | ICD-10-CM | POA: Diagnosis not present

## 2019-04-30 ENCOUNTER — Other Ambulatory Visit: Payer: Self-pay | Admitting: Neurology

## 2019-04-30 NOTE — Telephone Encounter (Signed)
Pt is taking 225mg  po dilantin at bedtime.

## 2019-05-01 ENCOUNTER — Ambulatory Visit: Payer: Medicare Other | Attending: Internal Medicine | Admitting: Internal Medicine

## 2019-05-01 ENCOUNTER — Other Ambulatory Visit: Payer: Self-pay

## 2019-05-04 DIAGNOSIS — H18423 Band keratopathy, bilateral: Secondary | ICD-10-CM | POA: Diagnosis not present

## 2019-05-19 ENCOUNTER — Other Ambulatory Visit (INDEPENDENT_AMBULATORY_CARE_PROVIDER_SITE_OTHER): Payer: Self-pay

## 2019-05-19 ENCOUNTER — Other Ambulatory Visit: Payer: Self-pay

## 2019-05-19 DIAGNOSIS — R569 Unspecified convulsions: Secondary | ICD-10-CM | POA: Diagnosis not present

## 2019-05-19 DIAGNOSIS — Z0289 Encounter for other administrative examinations: Secondary | ICD-10-CM

## 2019-05-20 ENCOUNTER — Telehealth: Payer: Self-pay | Admitting: *Deleted

## 2019-05-20 LAB — PHENYTOIN LEVEL, TOTAL: Phenytoin (Dilantin), Serum: 13.9 ug/mL (ref 10.0–20.0)

## 2019-05-20 NOTE — Telephone Encounter (Signed)
If calls back may relay that dilantin level normal.  Keep on current dose of dilantin.  225mg  by mouth at bedtime. If can make 6 month f/u appt in end of 10/20.

## 2019-05-20 NOTE — Telephone Encounter (Signed)
-----   Message from Suzzanne Cloud, NP sent at 05/20/2019 12:58 PM EDT ----- Please call patient. Dilantin level is good. Continue at current dose. Please ensure he has follow-up in 6 months from last appointment, which was in April 2020.

## 2019-05-21 NOTE — Telephone Encounter (Signed)
Spoke to family, relayed message of lab results.  Dilantin level good, continue on 225mg  po qhs,  Made RV 08-18-19 at 1445.  She verbalized understanding.

## 2019-05-22 ENCOUNTER — Other Ambulatory Visit: Payer: Self-pay | Admitting: Neurology

## 2019-05-25 ENCOUNTER — Other Ambulatory Visit: Payer: Self-pay

## 2019-05-25 ENCOUNTER — Encounter: Payer: Self-pay | Admitting: Internal Medicine

## 2019-05-25 ENCOUNTER — Ambulatory Visit: Payer: Medicare Other | Attending: Internal Medicine | Admitting: Internal Medicine

## 2019-05-25 DIAGNOSIS — R569 Unspecified convulsions: Secondary | ICD-10-CM

## 2019-05-25 DIAGNOSIS — I10 Essential (primary) hypertension: Secondary | ICD-10-CM

## 2019-05-25 DIAGNOSIS — E785 Hyperlipidemia, unspecified: Secondary | ICD-10-CM

## 2019-05-25 NOTE — Progress Notes (Signed)
Pt is needing a rx for a bp machine(will [facility])

## 2019-05-25 NOTE — Progress Notes (Signed)
Virtual Visit via Telephone Note Due to current restrictions/limitations of in-office visits due to the COVID-19 pandemic, this scheduled clinical appointment was converted to a telehealth visit  I connected with [name] on 05/25/19 at 12:20 p.m by telephone and verified that I am speaking with the correct person using two identifiers. I am in my office.  The patient is at home.  Only the patient, sister [name] and myself participated in this encounter.  I discussed the limitations, risks, security and privacy concerns of performing an evaluation and management service by telephone and the availability of in person appointments. I also discussed with the patient that there may be a patient responsible charge related to this service. The patient expressed understanding and agreed to proceed.  History of Present Illness: Ptwith hxof hepatitis C treated, szdisorder, CVA with residual left-sided weakness uses a rolling walker, HTN, memory deficit (followed by neurology), tubular adenomas.  Patient last evaluated 12/2018.  Today's visit is for chronic disease management.  HYPERTENSION Currently taking: see medication list Med Adherence: [x]  Yes    []  No Medication side effects: []  Yes    [x]  No Adherence with salt restriction: [x]  Yes    []  No Home Monitoring?: []  Yes    [x]  No Monitoring Frequency: []  Yes    []  No Home BP results range: []  Yes    []  No SOB? []  Yes    [x]  No Chest Pain?: []  Yes    [x]  No Leg swelling?: []  Yes    [x]  No Headaches?: []  Yes    [x]  No Dizziness? []  Yes    [x]  No Comments:    HL:  Compliant with Simvastatin.    SZ:  Saw neurology NP 02/2019.  Dilantin level elev so dose dec from 300 mg to 225 mg.  Most recent Dilantin level was 13.9 -no recent Sz No falls Reports good appetite. Sleeping good.   Outpatient Encounter Medications as of 05/25/2019  Medication Sig  . aspirin 325 MG EC tablet Take 1 tablet (325 mg total) by mouth daily.  Marland Kitchen levETIRAcetam  (KEPPRA) 750 MG tablet Take 2 tablets (1,500 mg total) by mouth 2 (two) times daily.  . memantine (NAMENDA) 10 MG tablet Take 1 tablet (10 mg total) by mouth 2 (two) times daily.  . metoprolol succinate (TOPROL-XL) 25 MG 24 hr tablet Take 1 tablet (25 mg total) by mouth daily.  . phenytoin (DILANTIN) 100 MG ER capsule Take 2 capsules (200 mg total) by mouth at bedtime.  . phenytoin (DILANTIN) 50 MG tablet Take 1 tablet at bedtime (Patient taking differently: Chew 25 mg by mouth at bedtime. Take 1 tablet at bedtime)  . simvastatin (ZOCOR) 20 MG tablet Take 1 tablet (20 mg total) by mouth daily at 6 PM.   No facility-administered encounter medications on file as of 05/25/2019.       Observations/Objective: Lab Results  Component Value Date   PHENYTOIN 13.9 05/19/2019   VALPROATE 71 03/30/2013   Lab Results  Component Value Date   WBC 5.1 02/25/2019   HGB 15.1 02/25/2019   HCT 43.5 02/25/2019   MCV 94 02/25/2019   PLT 200 02/25/2019     Chemistry      Component Value Date/Time   NA 140 03/23/2019 0913   K 4.5 03/23/2019 0913   CL 96 03/23/2019 0913   CO2 24 03/23/2019 0913   BUN 10 03/23/2019 0913   CREATININE 0.86 03/23/2019 0913   CREATININE 0.93 04/08/2017 1642  Component Value Date/Time   CALCIUM 9.3 03/23/2019 0913   ALKPHOS 143 (H) 03/23/2019 0913   AST 17 03/23/2019 0913   ALT 15 03/23/2019 0913   BILITOT 0.3 03/23/2019 0913     Lab Results  Component Value Date   WBC 5.1 02/25/2019   HGB 15.1 02/25/2019   HCT 43.5 02/25/2019   MCV 94 02/25/2019   PLT 200 02/25/2019     Assessment and Plan: 1. Essential hypertension No device to check blood pressure.  However blood pressure on past visits have been good.  Advised to continue current medications and low-salt diet.  2. Dyslipidemia Continue simvastatin  3. Seizure ([place]) No recent seizures.  Followed by neurology.  He will continue his AEDs   Follow Up Instructions: F/u in 3 mths   I discussed  the assessment and treatment plan with the patient. The patient was provided an opportunity to ask questions and all were answered. The patient agreed with the plan and demonstrated an understanding of the instructions.   The patient was advised to call back or seek an in-person evaluation if the symptoms worsen or if the condition fails to improve as anticipated.  I provided 10 minutes of non-face-to-face time during this encounter.   Karle Plumber, MD

## 2019-05-28 ENCOUNTER — Telehealth: Payer: Self-pay | Admitting: Internal Medicine

## 2019-05-28 NOTE — Telephone Encounter (Signed)
Attempted to reach patient no answer unable to LVM

## 2019-05-28 NOTE — Telephone Encounter (Signed)
-----   Message from Jackelyn Knife, [place] sent at 05/25/2019  1:52 PM EDT ----- Please contact pt and schedule a 3 month follow(in person)Morning

## 2019-06-15 ENCOUNTER — Other Ambulatory Visit: Payer: Self-pay | Admitting: Internal Medicine

## 2019-07-02 ENCOUNTER — Ambulatory Visit: Payer: Medicare Other | Attending: Family Medicine | Admitting: Pharmacist

## 2019-07-02 ENCOUNTER — Other Ambulatory Visit: Payer: Self-pay

## 2019-07-02 DIAGNOSIS — Z23 Encounter for immunization: Secondary | ICD-10-CM | POA: Diagnosis not present

## 2019-07-02 NOTE — Progress Notes (Signed)
Patient presents for vaccination against influenza per orders of Dr. Johnson. Consent given. Counseling provided. No contraindications exists. Vaccine administered without incident.   

## 2019-08-03 DIAGNOSIS — H2513 Age-related nuclear cataract, bilateral: Secondary | ICD-10-CM | POA: Diagnosis not present

## 2019-08-03 DIAGNOSIS — H18421 Band keratopathy, right eye: Secondary | ICD-10-CM | POA: Diagnosis not present

## 2019-08-03 DIAGNOSIS — H269 Unspecified cataract: Secondary | ICD-10-CM | POA: Diagnosis not present

## 2019-08-17 NOTE — Progress Notes (Signed)
PATIENT: [name] DOB: [birth year]  REASON FOR VISIT: follow up HISTORY FROM: patient  HISTORY OF PRESENT ILLNESS: Today 08/18/19  [name] is a 68 year old male with history of seizures and memory disorder.  After his last visit in April, he had reported recurrent seizures.  We restarted his Dilantin, and established a good maintenance dose of 225 mg daily, along with Keppra 1500 mg daily.  He has not had recurrent seizure since last seen.  He continues to live with his sister, [name].  His sister manages his medications and helps with his care.  In the home he is able to perform his ADLs, but requires some assistance with bathing.  During the day, he is not very active and often naps.  He enjoys watching TV, and walks around the home with his rolling walker.  He has not had any recent falls.  He does not drive a car.  He does have a left hemiparesis.  He presents today for follow-up accompanied by his sister. He is tolerating his medications well without side effect.  He denies any new problems or concerns.  HISTORY  01/29/2019 SS: [name] is a 68 year old male with history of seizures, memory disorder.  His MMSE in April 2019 was 11/30.  He has history of cerebrovascular disease associated with a left hemiparesis.  He is currently taking Keppra 750 mg 2 tablets twice daily.  He remains on Namenda 10 mg twice daily.  He currently lives at home with his sister.  She reports he has been having more seizures.  She describes his seizures as occurring every 2 weeks, noted to be staring off, lasting a few seconds, subsiding.  There has not been any generalized shaking, postictal description.  She reports she manages his medications, he has not missed any doses.  His nephew describes a seizure event that occurred when he was lying flat on his back, stared off, began to draw up, and then then turned him to his side, the event subsided.  Apparently this is been going on for several months.   Apparently this is his typical seizure.  He does not drive a car, he does walk with a walker, denies any falls.  He is able to dress himself, bathe himself.  He is able to tell me his name, birthday, confirms some details of the episodes.  01/27/2018 Dr. Jannifer Franklin: [name] is a 68 year old left-handed black male with a history of cerebrovascular disease associated with a left hemiparesis. The patient has had a history of seizures, he has not had any seizures since last seen 1 year ago. The patient remains on Keppra and he seems to tolerate the medication quite well. The patient uses a walker for ambulation, he denies any recent falls. He does have a memory problem, there have been no significant changes in his cognitive functioning level since last seen, he remains on Namenda. The patient returns to the office today for an evaluation.  REVIEW OF SYSTEMS: Out of a complete 14 system review of symptoms, the patient complains only of the following symptoms, and all other reviewed systems are negative.  Memory loss, seizures  ALLERGIES: No Known Allergies  HOME MEDICATIONS: Outpatient Medications Prior to Visit  Medication Sig Dispense Refill   carbamazepine (TEGRETOL XR) 100 MG 12 hr tablet      levETIRAcetam (KEPPRA) 750 MG tablet Take 2 tablets (1,500 mg total) by mouth 2 (two) times daily. 360 tablet 3   memantine (NAMENDA) 10 MG tablet  TAKE 1 TABLET BY MOUTH TWICE A DAY 180 tablet 3   metoprolol succinate (TOPROL-XL) 25 MG 24 hr tablet Take 1 tablet (25 mg total) by mouth daily. 90 tablet 2   phenytoin (DILANTIN) 100 MG ER capsule Take 2 capsules (200 mg total) by mouth at bedtime. 180 capsule 1   phenytoin (PHENYTOIN INFATABS) 50 MG tablet Chew 0.5 tablets (25 mg total) by mouth at bedtime. 45 tablet 1   simvastatin (ZOCOR) 20 MG tablet Take 1 tablet (20 mg total) by mouth daily at 6 PM. 90 tablet 2   aspirin 325 MG EC tablet Take 1 tablet (325 mg total) by mouth daily. 90 tablet  3   No facility-administered medications prior to visit.     PAST MEDICAL HISTORY: Past Medical History:  Diagnosis Date   Alcoholism ([place])    History of, not active   Dementia ([place])    Gait disorder    Gait disorder    Gingival hypertrophy    Secondary to Dilantin   Hemiparesis and alteration of sensations as late effects of stroke ([place]) 01/25/2017   Left hemiparesis   History of alcoholism ([place])    Hx of ischemic vertebrobasilar artery thalamic stroke    Right   Memory disorder 03/04/2013   Seizures ([place])    Stroke ([facility])    Right frontal, right thalamic   Ulnar neuropathy of left upper extremity     PAST SURGICAL HISTORY: Past Surgical History:  Procedure Laterality Date   COLONOSCOPY WITH PROPOFOL N/A 07/10/2017   Procedure: COLONOSCOPY WITH PROPOFOL;  Surgeon: Otis Brace, MD;  Location: [facility];  Service: Gastroenterology;  Laterality: N/A;   ESOPHAGOGASTRODUODENOSCOPY (EGD) WITH PROPOFOL N/A 07/10/2017   Procedure: ESOPHAGOGASTRODUODENOSCOPY (EGD) WITH PROPOFOL;  Surgeon: Otis Brace, MD;  Location: [facility];  Service: Gastroenterology;  Laterality: N/A;   HIP ARTHROPLASTY Left 11/26/2013   Procedure: LEFT HIP HEMIARTHROPLASTY;  Surgeon: Mcarthur Rossetti, MD;  Location: [facility];  Service: Orthopedics;  Laterality: Left;   WRIST SURGERY Left 95 & 96    FAMILY HISTORY: Family History  Problem Relation Age of Onset   Pulmonary embolism Mother    Liver disease Father    Diabetes Sister    Hypertension Sister    Hypertension Sister    Hypertension Sister    Seizures Neg Hx     SOCIAL HISTORY: Social History   Socioeconomic History   Marital status: Divorced    Spouse name: Not on file   Number of children: 0   Years of education: 10   Highest education level: Not on file  Occupational History   Not on file  Social Needs   Financial resource strain: Not on file   Food insecurity    Worry: Not on file     Inability: Not on file   Transportation needs    Medical: Not on file    Non-medical: Not on file  Tobacco Use   Smoking status: Former Smoker    Packs/day: 3.00    Quit date: 01/02/2014    Years since quitting: 5.6   Smokeless tobacco: Former Network engineer and Sexual Activity   Alcohol use: No    Alcohol/week: 0.0 standard drinks    Comment: Former Alcoholic   Drug use: No   Sexual activity: Yes  Lifestyle   Physical activity    Days per week: Not on file    Minutes per session: Not on file   Stress: Not on file  Relationships  Social Herbalist on phone: Not on file    Gets together: Not on file    Attends religious service: Not on file    Active member of club or organization: Not on file    Attends meetings of clubs or organizations: Not on file    Relationship status: Not on file   Intimate partner violence    Fear of current or ex partner: Not on file    Emotionally abused: Not on file    Physically abused: Not on file    Forced sexual activity: Not on file  Other Topics Concern   Not on file  Social History Narrative   Patient is single and lives with a friend and her son.   Patient does not have any children.   Patient is disabled.   Patient has a 10 th grade education.   Patient is left-handed.   Patient drinks some soda but not everyday.    PHYSICAL EXAM  Vitals:   08/18/19 1442  BP: 118/79  Pulse: 61  Temp: (!) 97.5 F (36.4 C)  Weight: 170 lb 3.2 oz (77.2 kg)  Height: 5\' 9"  (1.753 m)   Body mass index is 25.13 kg/m.  Generalized: Well developed, in no acute distress  MMSE - Mini Mental State Exam 08/18/2019 01/27/2018 09/28/2015  Orientation to time 0 0 0  Orientation to Place 3 2 3   Registration 3 3 3   Attention/ Calculation 0 0 0  Recall 0 0 0  Language- name 2 objects 2 2 2   Language- repeat 0 1 1  Language- follow 3 step command 2 3 3   Language- read & follow direction 0 0 0  Write a sentence 0 0 0  Copy design  0 0 0  Total score 10 11 12     Neurological examination  Mentation: Alert, history is provided by his sister.  Speech is clear, some difficulty following exam commands Cranial nerve II-XII: Pupils were equal round reactive to light. Extraocular movements were full, visual field were full on confrontational test. Facial sensation and strength were normal.  Head turning and shoulder shrug  were normal and symmetric. Motor: The motor testing reveals 5 over 5 strength of all 4 extremities. Good symmetric motor tone is noted throughout. Left grip is weaker than the right.  Sensory: Sensory testing is intact to soft touch on all 4 extremities. No evidence of extinction is noted.  Coordination: Cerebellar testing reveals good finger-nose-finger and heel-to-shin bilaterally.  Gait and station: Rising from seated position with pushoff, ambulates with rolling walker, circumduction type gait with the left leg Reflexes: Deep tendon reflexes are symmetric   DIAGNOSTIC DATA (LABS, IMAGING, TESTING) - I reviewed patient records, labs, notes, testing and imaging myself where available.  Lab Results  Component Value Date   WBC 5.1 02/25/2019   HGB 15.1 02/25/2019   HCT 43.5 02/25/2019   MCV 94 02/25/2019   PLT 200 02/25/2019      Component Value Date/Time   NA 140 03/23/2019 0913   K 4.5 03/23/2019 0913   CL 96 03/23/2019 0913   CO2 24 03/23/2019 0913   GLUCOSE 88 03/23/2019 0913   GLUCOSE 84 04/08/2017 1642   BUN 10 03/23/2019 0913   CREATININE 0.86 03/23/2019 0913   CREATININE 0.93 04/08/2017 1642   CALCIUM 9.3 03/23/2019 0913   PROT 7.0 03/23/2019 0913   ALBUMIN 4.5 03/23/2019 0913   AST 17 03/23/2019 0913   ALT 15 03/23/2019 0913  ALKPHOS 143 (H) 03/23/2019 0913   BILITOT 0.3 03/23/2019 0913   GFRNONAA 90 03/23/2019 0913   GFRNONAA 86 04/08/2017 1642   GFRAA 104 03/23/2019 0913   GFRAA >89 04/08/2017 1642   Lab Results  Component Value Date   CHOL 110 03/04/2018   HDL 32 (L)  03/04/2018   LDLCALC 61 03/04/2018   TRIG 85 03/04/2018   CHOLHDL 3.4 03/04/2018   Lab Results  Component Value Date   HGBA1C 5.3 05/23/2016   No results found for: [id number] Lab Results  Component Value Date   TSH 2.292 03/02/2015    ASSESSMENT AND PLAN 68 y.o. year old male  has a past medical history of Alcoholism ([place]), Dementia ([place]), Gait disorder, Gait disorder, Gingival hypertrophy, Hemiparesis and alteration of sensations as late effects of stroke ([place]) (01/25/2017), History of alcoholism ([place]), ischemic vertebrobasilar artery thalamic stroke, Memory disorder (03/04/2013), Seizures ([place]), Stroke ([place]), and Ulnar neuropathy of left upper extremity. here with:  1.  Cerebrovascular disease, left hemiparesis 2.  Seizure disorder 3.  Gait disorder 4.  Memory disorder  He has not had recurrence seizure since last seen.  He will remain on Dilantin 225 mg at bedtime, along with Keppra 1500 mg twice a day.  I will check lab work today along with a Dilantin level.  His memory score is stable 10/30.  He remains on Namenda, prescribed by his primary doctor.  He has not had any recent falls.  He will continue using his rolling walker.  He will follow-up in 6 months or sooner if needed.  I did advise if his symptoms worsen or if he develops any new symptoms he should let us know.  I spent 25 minutes with the patient. 50% of this time was spent discussing his plan of care.  Butler Denmark, AGNP-C, DNP 08/18/2019, 2:54 PM [facility] [address], [place], [place] [address] [phone]

## 2019-08-18 ENCOUNTER — Ambulatory Visit (INDEPENDENT_AMBULATORY_CARE_PROVIDER_SITE_OTHER): Payer: Medicare Other | Admitting: Neurology

## 2019-08-18 ENCOUNTER — Encounter: Payer: Self-pay | Admitting: Neurology

## 2019-08-18 ENCOUNTER — Other Ambulatory Visit: Payer: Self-pay

## 2019-08-18 VITALS — BP 118/79 | HR 61 | Temp 97.5°F | Ht 69.0 in | Wt 170.2 lb

## 2019-08-18 DIAGNOSIS — R269 Unspecified abnormalities of gait and mobility: Secondary | ICD-10-CM

## 2019-08-18 DIAGNOSIS — R413 Other amnesia: Secondary | ICD-10-CM

## 2019-08-18 DIAGNOSIS — R569 Unspecified convulsions: Secondary | ICD-10-CM

## 2019-08-18 MED ORDER — PHENYTOIN SODIUM EXTENDED 100 MG PO CAPS: 200.0000 mg | ORAL_CAPSULE | Freq: Every day | ORAL | 1 refills | Status: DC

## 2019-08-18 MED ORDER — PHENYTOIN 50 MG PO CHEW: 25.0000 mg | CHEWABLE_TABLET | Freq: Every day | ORAL | 1 refills | Status: DC

## 2019-08-18 NOTE — Patient Instructions (Signed)
Continue Dilantin and Keppra. Continue Namenda for his memory. I will check lab work today.

## 2019-08-18 NOTE — Progress Notes (Signed)
I have read the note, and I agree with the clinical assessment and plan.  Charles K Willis   

## 2019-08-19 ENCOUNTER — Telehealth: Payer: Self-pay | Admitting: *Deleted

## 2019-08-19 ENCOUNTER — Telehealth: Payer: Self-pay

## 2019-08-19 LAB — COMPREHENSIVE METABOLIC PANEL
ALT: 15 IU/L (ref 0–44)
AST: 13 IU/L (ref 0–40)
Albumin/Globulin Ratio: 1.6 (ref 1.2–2.2)
Albumin: 4.5 g/dL (ref 3.8–4.8)
Alkaline Phosphatase: 171 IU/L — ABNORMAL HIGH (ref 39–117)
BUN/Creatinine Ratio: 11 (ref 10–24)
BUN: 12 mg/dL (ref 8–27)
Bilirubin Total: 0.2 mg/dL (ref 0.0–1.2)
CO2: 25 mmol/L (ref 20–29)
Calcium: 9.7 mg/dL (ref 8.6–10.2)
Chloride: 102 mmol/L (ref 96–106)
Creatinine, Ser: 1.05 mg/dL (ref 0.76–1.27)
GFR calc Af Amer: 85 mL/min/{1.73_m2} (ref >59)
GFR calc non Af Amer: 74 mL/min/{1.73_m2} (ref >59)
Globulin, Total: 2.8 g/dL (ref 1.5–4.5)
Glucose: 93 mg/dL (ref 65–99)
Potassium: 5 mmol/L (ref 3.5–5.2)
Sodium: 141 mmol/L (ref 134–144)
Total Protein: 7.3 g/dL (ref 6.0–8.5)

## 2019-08-19 LAB — CBC WITH DIFFERENTIAL/PLATELET
Basophils Absolute: 0 10*3/uL (ref 0.0–0.2)
Basos: 1 %
EOS (ABSOLUTE): 0.2 10*3/uL (ref 0.0–0.4)
Eos: 4 %
Hematocrit: 44.2 % (ref 37.5–51.0)
Hemoglobin: 15.7 g/dL (ref 13.0–17.7)
Immature Grans (Abs): 0 10*3/uL (ref 0.0–0.1)
Immature Granulocytes: 0 %
Lymphocytes Absolute: 1.6 10*3/uL (ref 0.7–3.1)
Lymphs: 29 %
MCH: 33.6 pg — ABNORMAL HIGH (ref 26.6–33.0)
MCHC: 35.5 g/dL (ref 31.5–35.7)
MCV: 95 fL (ref 79–97)
Monocytes Absolute: 0.6 10*3/uL (ref 0.1–0.9)
Monocytes: 10 %
Neutrophils Absolute: 3.2 10*3/uL (ref 1.4–7.0)
Neutrophils: 56 %
Platelets: 194 10*3/uL (ref 150–450)
RBC: 4.67 x10E6/uL (ref 4.14–5.80)
RDW: 12.2 % (ref 11.6–15.4)
WBC: 5.6 10*3/uL (ref 3.4–10.8)

## 2019-08-19 LAB — PHENYTOIN LEVEL, TOTAL: Phenytoin (Dilantin), Serum: 14.6 ug/mL (ref 10.0–20.0)

## 2019-08-19 NOTE — Telephone Encounter (Signed)
Called to share pts recent lab results:    "Notes recorded by Suzzanne Cloud, NP on 08/19/2019 at 8:33 AM EDT  Please call the patient. Labs look good. Therapeutic level of Dilantin. Elevated alkaline phosphatase 171, will follow overtime, may be related to Dilantin."   No answer, lvm to tell him to call back. No DPR on file.

## 2019-08-19 NOTE — Telephone Encounter (Signed)
LMVM for pt to return call.   

## 2019-08-19 NOTE — Telephone Encounter (Signed)
-----   Message from Suzzanne Cloud, NP sent at 08/19/2019  8:33 AM EDT ----- Please call the patient. Labs look good. Therapeutic level of Dilantin. Elevated alkaline phosphatase 171, will follow overtime, may be related to Dilantin.

## 2019-08-26 ENCOUNTER — Encounter: Payer: Self-pay | Admitting: Neurology

## 2019-08-26 ENCOUNTER — Telehealth: Payer: Self-pay | Admitting: Neurology

## 2019-08-26 NOTE — Telephone Encounter (Signed)
The patient's sister reached out and I was able to inform her of the lab result findings. Patient verbalized understanding of the results.

## 2019-08-26 NOTE — Telephone Encounter (Signed)
-----   Message from Darleen Crocker, RN sent at 08/20/2019  2:08 PM EDT -----  ----- Message ----- From: Suzzanne Cloud, NP Sent: 08/19/2019   8:33 AM EDT To: Brandon Melnick, RN  Please call the patient. Labs look good. Therapeutic level of Dilantin. Elevated alkaline phosphatase 171, will follow overtime, may be related to Dilantin.

## 2019-08-26 NOTE — Telephone Encounter (Signed)
This is the third attempt to call the patient to review the results with him. There was no answer. LVM for the patient to call back. If no reply will send a letter informing the patinet that we have attempted to call.

## 2019-09-11 ENCOUNTER — Ambulatory Visit: Payer: Medicare Other | Attending: Internal Medicine | Admitting: Internal Medicine

## 2019-09-11 ENCOUNTER — Encounter: Payer: Self-pay | Admitting: Internal Medicine

## 2019-09-11 ENCOUNTER — Other Ambulatory Visit: Payer: Self-pay

## 2019-09-11 VITALS — BP 129/81 | HR 68 | Temp 98.5°F | Resp 16 | Wt 170.6 lb

## 2019-09-11 DIAGNOSIS — R569 Unspecified convulsions: Secondary | ICD-10-CM | POA: Insufficient documentation

## 2019-09-11 DIAGNOSIS — Z87891 Personal history of nicotine dependence: Secondary | ICD-10-CM | POA: Insufficient documentation

## 2019-09-11 DIAGNOSIS — I10 Essential (primary) hypertension: Secondary | ICD-10-CM | POA: Diagnosis not present

## 2019-09-11 DIAGNOSIS — E785 Hyperlipidemia, unspecified: Secondary | ICD-10-CM | POA: Diagnosis not present

## 2019-09-11 DIAGNOSIS — R748 Abnormal levels of other serum enzymes: Secondary | ICD-10-CM | POA: Diagnosis not present

## 2019-09-11 DIAGNOSIS — Z79899 Other long term (current) drug therapy: Secondary | ICD-10-CM | POA: Insufficient documentation

## 2019-09-11 DIAGNOSIS — Z7901 Long term (current) use of anticoagulants: Secondary | ICD-10-CM | POA: Diagnosis not present

## 2019-09-11 DIAGNOSIS — Z125 Encounter for screening for malignant neoplasm of prostate: Secondary | ICD-10-CM | POA: Insufficient documentation

## 2019-09-11 DIAGNOSIS — K7400 Hepatic fibrosis, unspecified: Secondary | ICD-10-CM | POA: Diagnosis not present

## 2019-09-11 MED ORDER — SIMVASTATIN 20 MG PO TABS: 20.0000 mg | ORAL_TABLET | Freq: Every day | ORAL | 2 refills | Status: DC

## 2019-09-11 NOTE — Progress Notes (Signed)
Patient ID: [name], male    DOB: 12/08/1950  MRN: [id number]  CC: Hypertension   Subjective: [name] is a 68 y.o. male who presents for chronic ds management.  Sister is with him His concerns today include:  Ptwith hxof hepatitis C treated, szdisorder, CVA with residual left-sided weakness uses a rolling walker, HTN, memory deficit (followed by neurology), tubular adenomas.   HYPERTENSION Currently taking: see medication list Med Adherence: [x]  Yes    []  No Medication side effects: []  Yes    [x]  No Adherence with salt restriction: [x]  Yes    []  No Home Monitoring?: []  Yes    [x]  No Monitoring Frequency:  Home BP results range: []  Yes    []  No SOB? []  Yes    [x]  No Chest Pain?: []  Yes    [x]  No Leg swelling?: []  Yes    [x]  No Headaches?: []  Yes    [x]  No Dizziness? []  Yes    [x]  No Comments: would like rxn for home BP monitor  HL: compliant with Zocor  SZ: no recent sz No falls.   Had recent blood test done through his neurologist.  Alkaline phosphatase noted to be persistently elevated.  HM :Sister would like for him to have PSA for prostate cancer screening but only if his insurance covers for it. Patient Active Problem List   Diagnosis Date Noted  . Adenomatous polyp of colon 09/02/2017  . Functional urinary incontinence 09/02/2017  . Hemiparesis and alteration of sensations as late effects of stroke ([name]) 01/25/2017  . 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/23/2015  . HTN (hypertension) 06/20/2015  . 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  . Hip fracture ([name]) 11/26/2013  . Seizure ([name]) 11/26/2013  . Memory disorder 03/04/2013  . Depressive disorder, not elsewhere classified 07/24/2012  . Abnormality of gait 07/24/2012  . Localization-related (focal) (partial) epilepsy and epileptic syndromes with complex partial seizures, without mention of intractable epilepsy 07/24/2012  . Cerebral thrombosis with cerebral infarction ([name]) 07/24/2012  . Lesion of ulnar nerve  07/24/2012     Current Outpatient Medications on File Prior to Visit  Medication Sig Dispense Refill  . levETIRAcetam (KEPPRA) 750 MG tablet Take 2 tablets (1,500 mg total) by mouth 2 (two) times daily. 360 tablet 3  . memantine (NAMENDA) 10 MG tablet TAKE 1 TABLET BY MOUTH TWICE A DAY 180 tablet 3  . metoprolol succinate (TOPROL-XL) 25 MG 24 hr tablet Take 1 tablet (25 mg total) by mouth daily. 90 tablet 2  . phenytoin (DILANTIN) 100 MG ER capsule Take 2 capsules (200 mg total) by mouth at bedtime. 180 capsule 1  . phenytoin (PHENYTOIN INFATABS) 50 MG tablet Chew 0.5 tablets (25 mg total) by mouth at bedtime. 45 tablet 1  . simvastatin (ZOCOR) 20 MG tablet Take 1 tablet (20 mg total) by mouth daily at 6 PM. 90 tablet 2   No current facility-administered medications on file prior to visit.     No Known Allergies  Social History   Socioeconomic History  . Marital status: Divorced    Spouse name: Not on file  . Number of children: 0  . Years of education: 10  . Highest education level: Not on file  Occupational History  . Not on file  Social Needs  . Financial resource strain: Not on file  . Food insecurity    Worry: Not on file    Inability: Not on file  . Transportation needs    Medical: Not on file  Non-medical: Not on file  Tobacco Use  . Smoking status: Former Smoker    Packs/day: 3.00    Quit date: 01/02/2014    Years since quitting: 5.6  . Smokeless tobacco: Former Network engineer and Sexual Activity  . Alcohol use: No    Alcohol/week: 0.0 standard drinks    Comment: Former Alcoholic  . Drug use: No  . Sexual activity: Yes  Lifestyle  . Physical activity    Days per week: Not on file    Minutes per session: Not on file  . Stress: Not on file  Relationships  . Social Herbalist on phone: Not on file    Gets together: Not on file    Attends religious service: Not on file    Active member of club or organization: Not on file    Attends meetings  of clubs or organizations: Not on file    Relationship status: Not on file  . Intimate partner violence    Fear of current or ex partner: Not on file    Emotionally abused: Not on file    Physically abused: Not on file    Forced sexual activity: Not on file  Other Topics Concern  . Not on file  Social History Narrative   Patient is single and lives with a friend and her son.   Patient does not have any children.   Patient is disabled.   Patient has a 10 th grade education.   Patient is left-handed.   Patient drinks some soda but not everyday.    Family History  Problem Relation Age of Onset  . Pulmonary embolism Mother   . Liver disease Father   . Diabetes Sister   . Hypertension Sister   . Hypertension Sister   . Hypertension Sister   . Seizures Neg Hx     Past Surgical History:  Procedure Laterality Date  . COLONOSCOPY WITH PROPOFOL N/A 07/10/2017   Procedure: COLONOSCOPY WITH PROPOFOL;  Surgeon: Otis Brace, MD;  Location: [place];  Service: Gastroenterology;  Laterality: N/A;  . ESOPHAGOGASTRODUODENOSCOPY (EGD) WITH PROPOFOL N/A 07/10/2017   Procedure: ESOPHAGOGASTRODUODENOSCOPY (EGD) WITH PROPOFOL;  Surgeon: Otis Brace, MD;  Location: [facility];  Service: Gastroenterology;  Laterality: N/A;  . HIP ARTHROPLASTY Left 11/26/2013   Procedure: LEFT HIP HEMIARTHROPLASTY;  Surgeon: Mcarthur Rossetti, MD;  Location: [place];  Service: Orthopedics;  Laterality: Left;  . WRIST SURGERY Left 95 & 96    ROS: Review of Systems  Gastrointestinal:       Moving bowels okay  Genitourinary: Negative for difficulty urinating and hematuria.     PHYSICAL EXAM: BP 129/81   Pulse 68   Temp 98.5 F (36.9 C) (Oral)   Resp 16   Wt 170 lb 9.6 oz (77.4 kg)   SpO2 96%   BMI 25.19 kg/m   Physical Exam Constitutional:      Appearance: Normal appearance.  HENT:     Nose: Nose normal.     Mouth/Throat:     Mouth: Mucous membranes are moist.     Pharynx:  Oropharynx is clear.  Neck:     Musculoskeletal: Normal range of motion.  Cardiovascular:     Rate and Rhythm: Normal rate and regular rhythm.  Pulmonary:     Effort: Pulmonary effort is normal.     Breath sounds: Normal breath sounds.  Abdominal:     General: Abdomen is flat. Bowel sounds are normal.     Palpations: There  is no mass.  Neurological:     Mental Status: He is alert.     Comments: Ambulates with rollator walker    CMP Latest Ref Rng & Units 08/18/2019 03/23/2019 02/25/2019  Glucose 65 - 99 mg/dL 93 88 96  BUN 8 - 27 mg/dL 12 10 11   Creatinine 0.76 - 1.27 mg/dL 1.05 0.86 1.00  Sodium 134 - 144 mmol/L 141 140 141  Potassium 3.5 - 5.2 mmol/L 5.0 4.5 4.7  Chloride 96 - 106 mmol/L 102 96 101  CO2 20 - 29 mmol/L 25 24 22   Calcium 8.6 - 10.2 mg/dL 9.7 9.3 9.9  Total Protein 6.0 - 8.5 g/dL 7.3 7.0 7.6  Total Bilirubin 0.0 - 1.2 mg/dL 0.2 0.3 0.2  Alkaline Phos 39 - 117 IU/L 171(H) 143(H) 130(H)  AST 0 - 40 IU/L 13 17 22   ALT 0 - 44 IU/L 15 15 17    Lipid Panel     Component Value Date/Time   CHOL 110 03/04/2018 1507   TRIG 85 03/04/2018 1507   HDL 32 (L) 03/04/2018 1507   CHOLHDL 3.4 03/04/2018 1507   CHOLHDL 2.5 05/29/2016 0943   VLDL 16 05/29/2016 0943   LDLCALC 61 03/04/2018 1507    CBC    Component Value Date/Time   WBC 5.6 08/18/2019 1514   WBC 5.9 05/23/2016 1117   RBC 4.67 08/18/2019 1514   RBC 4.32 05/23/2016 1117   HGB 15.7 08/18/2019 1514   HCT 44.2 08/18/2019 1514   PLT 194 08/18/2019 1514   MCV 95 08/18/2019 1514   MCH 33.6 (H) 08/18/2019 1514   MCH 32.2 05/23/2016 1117   MCHC 35.5 08/18/2019 1514   MCHC 33.9 05/23/2016 1117   RDW 12.2 08/18/2019 1514   LYMPHSABS 1.6 08/18/2019 1514   MONOABS 531 05/23/2016 1117   EOSABS 0.2 08/18/2019 1514   BASOSABS 0.0 08/18/2019 1514    ASSESSMENT AND PLAN: 1. Essential hypertension Close to goal.  Continue current medications and low-salt diet.  2. Dyslipidemia - Lipid panel - simvastatin  (ZOCOR) 20 MG tablet; Take 1 tablet (20 mg total) by mouth daily at 6 PM.  Dispense: 90 tablet; Refill: 2  3. Seizure ([place]) Well-controlled on medication.  Followed by neurology.  4. Elevated alkaline phosphatase level - Gamma GT - Parathyroid hormone, intact (no Ca)  5. Prostate cancer screening Advised patient that his insurance may not pay for the PSA.  Cystoscopy declined having it checked because of this.    Patient was given the opportunity to ask questions.  Patient verbalized understanding of the plan and was able to repeat key elements of the plan.   No orders of the defined types were placed in this encounter.    Requested Prescriptions    No prescriptions requested or ordered in this encounter    No follow-ups on file.  Karle Plumber, MD, FACP

## 2019-09-12 LAB — GAMMA GT: GGT: 112 IU/L — ABNORMAL HIGH (ref 0–65)

## 2019-09-12 LAB — LIPID PANEL
Chol/HDL Ratio: 4.2 ratio (ref 0.0–5.0)
Cholesterol, Total: 126 mg/dL (ref 100–199)
HDL: 30 mg/dL — ABNORMAL LOW (ref >39)
LDL Chol Calc (NIH): 75 mg/dL (ref 0–99)
Triglycerides: 113 mg/dL (ref 0–149)
VLDL Cholesterol Cal: 21 mg/dL (ref 5–40)

## 2019-09-12 LAB — PARATHYROID HORMONE, INTACT (NO CA): PTH: 24 pg/mL (ref 15–65)

## 2019-09-13 ENCOUNTER — Other Ambulatory Visit: Payer: Self-pay | Admitting: Internal Medicine

## 2019-09-13 DIAGNOSIS — R748 Abnormal levels of other serum enzymes: Secondary | ICD-10-CM

## 2019-09-15 ENCOUNTER — Telehealth: Payer: Self-pay

## 2019-09-15 NOTE — Telephone Encounter (Signed)
Contacted pt sister to go over lab results was unable to reach due to call can't be completed at this time

## 2019-10-12 ENCOUNTER — Telehealth: Payer: Self-pay | Admitting: Internal Medicine

## 2019-10-12 DIAGNOSIS — I10 Essential (primary) hypertension: Secondary | ICD-10-CM

## 2019-10-12 NOTE — Telephone Encounter (Signed)
Patient's sister called to request patients results from 09/11/2019. Please follow up when possible as she missed the call back in November.  Phone number [phone]

## 2019-10-13 NOTE — Telephone Encounter (Signed)
Returned pt sister call and left a detailed vm informing pt of results and if  they have any questions or concerns to give me a call

## 2019-10-19 NOTE — Telephone Encounter (Signed)
Patient sister came in with the patient asking for the labs and would like a call back [phone]

## 2019-10-20 MED ORDER — METOPROLOL SUCCINATE ER 25 MG PO TB24: 25.0000 mg | ORAL_TABLET | Freq: Every day | ORAL | 2 refills | Status: DC

## 2019-10-20 NOTE — Telephone Encounter (Signed)
Returned pt sister call and went over pt lab results. Pt sister states she will bring pt by after the holiday to get blood work. Pt sister is requesting a rx for a bp machine

## 2019-10-20 NOTE — Telephone Encounter (Signed)
No ma'am she is referring to a bp monitor. She is needing a rx to take to a medical supply store

## 2019-10-22 NOTE — Telephone Encounter (Signed)
Contacted pt sister and left a detailed vm informing her that rx is ready for pick up

## 2019-10-29 ENCOUNTER — Other Ambulatory Visit: Payer: Self-pay

## 2019-10-29 ENCOUNTER — Other Ambulatory Visit: Payer: Self-pay | Admitting: Internal Medicine

## 2019-10-29 ENCOUNTER — Ambulatory Visit: Payer: Medicare Other | Attending: Internal Medicine

## 2019-10-29 DIAGNOSIS — R748 Abnormal levels of other serum enzymes: Secondary | ICD-10-CM

## 2019-10-30 LAB — HEPATIC FUNCTION PANEL
ALT: 17 IU/L (ref 0–44)
AST: 19 IU/L (ref 0–40)
Albumin: 4.9 g/dL — ABNORMAL HIGH (ref 3.8–4.8)
Alkaline Phosphatase: 180 IU/L — ABNORMAL HIGH (ref 39–117)
Bilirubin Total: 0.5 mg/dL (ref 0.0–1.2)
Bilirubin, Direct: 0.18 mg/dL (ref 0.00–0.40)
Total Protein: 7.7 g/dL (ref 6.0–8.5)

## 2019-10-30 LAB — MITOCHONDRIAL ANTIBODIES: Mitochondrial Ab: 22.4 Units — ABNORMAL HIGH (ref 0.0–20.0)

## 2019-11-01 ENCOUNTER — Other Ambulatory Visit: Payer: Self-pay | Admitting: Internal Medicine

## 2019-11-01 DIAGNOSIS — R748 Abnormal levels of other serum enzymes: Secondary | ICD-10-CM

## 2019-11-01 NOTE — Progress Notes (Signed)
Let patient and his sister know that one of his liver enzymes is still elevated.  I have referred him to a gastroenterologist for further evaluation.

## 2019-11-02 ENCOUNTER — Telehealth: Payer: Self-pay

## 2019-11-02 NOTE — Telephone Encounter (Signed)
Contacted pt sister to go over lab results pt sister didn't answer lvm asking for a call back at their earliest convenience

## 2019-11-12 ENCOUNTER — Telehealth: Payer: Self-pay

## 2019-11-12 NOTE — Telephone Encounter (Signed)
Instructions were given to the sister as ordered by MD Johnson/ Results were reviewed today/  Referral to Gastro placed in [place] / sister informed to call back

## 2019-11-15 NOTE — Progress Notes (Signed)
11/15/2019 [name] [id number] 07/12/1951   Chief Complaint:  Liver issues   HISTORY OF PRESENT ILLNESS: [name] is a 69 year old male with a past medical history of alcoholism, dementia, seizures, CVA  and chronic hepatitis C GT1a treated with Harvoni 07/2016 x 1 month with SVR.  Past left hand/wrist surgery and left hip arthroplasty surgery.  He presents with his sister, [name], who facilitates obtaining his medical history. He was referred by his primary care physician, Dr. Karle Plumber, for further evaluation regarding an elevated alk phosphatase level.  He denies having any nausea or vomiting.  No upper or lower abdominal pain.  He is passing a normal formed brown bowel movement daily.  No rectal bleeding or melena.  History of  tubular adenomatous and tubulovillous adenomatous colon polyps polyps removed at the time of his colonoscopy 06/2017.  See results below.  No dysuria.  He reports his appetite is good.  His weight is stable.  No fever, sweats or chills.   History of chronic hepatitis C genotype 1a.  He was followed by infectious disease specialist, Dr. Thomasena Edis.  He was prescribed Harvoni 07/2016 for 12 weeks.  However, he completed only 1 month of the Harvoni then stopped it as he was unable to maintain his follow-up appointments. He was seen again by ID June 2018,  treatment with [place] was considered but his Hep C RNA was undetectable and no furhter treatment was required. He obtained SVR after 4 weeks of Harvoni.    Hepatic Function Latest Ref Rng & Units 11/17/2019 10/29/2019 08/18/2019  Total Protein 6.0 - 8.3 g/dL 7.6 7.7 7.3  Albumin 3.5 - 5.2 g/dL 4.5 4.9(H) 4.5  AST 0 - 37 U/L _0 ALT 0 - 53 U/L _1 Alk Phosphatase 39 - 117 U/L 163(H) 180(H) 171(H)  Total Bilirubin 0.2 - 1.2 mg/dL 0.5 0.5 0.2  Bilirubin, Direct 0.0 - 0.3 mg/dL 0.1 0.18 -    EGD 07/10/2017 by Dr Alessandra Bevels: - Erythematous, increased vascular pattern mucosa in the  esophagus.  Biopsies of the GE junction   identified chronic   inflammation, negative for intestinal metaplasia or dysplasia. - 4 cm hiatal hernia. - Normal mucosa was found in the entire stomach. - Normal duodenal bulb, first portion of the duodenum and second portion of the duodenum.  Colonoscopy 07/10/2017: - One 8 mm TA polyp in the transverse colon, removed with a cold snare. - One 8 mm TA polyp at the splenic flexure, removed piecemeal using a cold snare.  - One 10 mm TA polyp in the descending colon. - One 10 mm tubulovillous adenoma without high-grade dysplasia. - One 15 mm tubulovillous adenoma without high-grade dysplasia polyp in the sigmoid colon, removed      piecemeal using   a hot snare. Resected and retrieved. Clip was placed. - Diverticulosis in the entire examined colon. - Internal hemorrhoids.  Abdominal sonogram and elastography 06/22/2015:  1. Normal diagnostic abdominal sonogram. No liver mass detected. No macroscopic evidence of cirrhosis. 2. Hepatic elastography results: Median hepatic shear wave velocity is calculated at 2.67 m/sec. Corresponding Metavir fibrosis score is Some F3 + F4. Risk of fibrosis is high.  Family history: Father with history of alcoholic cirrhosis.  No family history of esophageal, gastric or colorectal cancer.  No family history of liver or pancreatic cancer. Mother with history of PE.  Social history: The patient is single.  His sister is his primary  caregiver.  No alcohol for the past 15 years.  No drug use.  He quit smoking 5 years ago.   Current Outpatient Medications on File Prior to Visit  Medication Sig Dispense Refill  . levETIRAcetam (KEPPRA) 750 MG tablet Take 2 tablets (1,500 mg total) by mouth 2 (two) times daily. 360 tablet 3  . memantine (NAMENDA) 10 MG tablet TAKE 1 TABLET BY MOUTH TWICE A DAY 180 tablet 3  . metoprolol succinate (TOPROL-XL) 25 MG 24 hr tablet Take 1 tablet (25 mg total) by mouth daily. 90 tablet 2  .  phenytoin (DILANTIN) 100 MG ER capsule Take 2 capsules (200 mg total) by mouth at bedtime. 180 capsule 1  . phenytoin (PHENYTOIN INFATABS) 50 MG tablet Chew 0.5 tablets (25 mg total) by mouth at bedtime. 45 tablet 1  . simvastatin (ZOCOR) 20 MG tablet Take 1 tablet (20 mg total) by mouth daily at 6 PM. 90 tablet 2   No current facility-administered medications on file prior to visit.   No Known Allergies     Outpatient Encounter Medications as of 11/17/2019  Medication Sig  . levETIRAcetam (KEPPRA) 750 MG tablet Take 2 tablets (1,500 mg total) by mouth 2 (two) times daily.  . memantine (NAMENDA) 10 MG tablet TAKE 1 TABLET BY MOUTH TWICE A DAY  . metoprolol succinate (TOPROL-XL) 25 MG 24 hr tablet Take 1 tablet (25 mg total) by mouth daily.  . phenytoin (DILANTIN) 100 MG ER capsule Take 2 capsules (200 mg total) by mouth at bedtime.  . phenytoin (PHENYTOIN INFATABS) 50 MG tablet Chew 0.5 tablets (25 mg total) by mouth at bedtime.  . simvastatin (ZOCOR) 20 MG tablet Take 1 tablet (20 mg total) by mouth daily at 6 PM.   No facility-administered encounter medications on file as of 11/17/2019.     REVIEW OF SYSTEMS  : All other systems reviewed and negative except where noted in the History of Present Illness.  PHYSICAL EXAM:  General: Thin 69 year old male walks with left leg limp, uses a walker, in no acute distress. Head: Normocephalic and atraumatic. Eyes:  Sclerae non-icteric, conjunctive pink. Ears: Normal auditory acuity. Mouth: Absent dentition. No ulcers or lesions.  Neck: Supple, no lymphadenopathy or thyromegaly.  Lungs: Clear bilaterally to auscultation without wheezes, crackles or rhonchi. Heart: Regular rate and rhythm. No murmur, rub or gallop appreciated.  Abdomen: Soft, nontender, non distended. No masses. No hepatosplenomegaly. Normoactive bowel sounds x 4 quadrants.  Rectal: Deferred.  Musculoskeletal: Symmetrical with no gross deformities. Skin: Warm and dry. No rash  or lesions on visible extremities. Extremities: Left hand deformity with strong hand grasps bilaterally, no hemiparesis. No edema. Neurological: Alert oriented x 4, no focal deficits.  Psychological:  Alert and cooperative. Normal mood and affect.  ASSESSMENT AND PLAN:  47. 69 year old male with a past medical history of chronic hepatitis C GT1a treated with Harvoni 10/17 x 4 weeks with SVR presents for further evaluation regarding elevated alk phos level with normal AST and ALT levels.  -hepatic panel, alk phos isoenzymes, Hep C RNA quant, GGT, ANA, AMA and SMA -Abdominal sonogram -Consider scheduling a MRCP if patient tolerated, await the above lab orders  -Further follow-up to be determined after the above evaluation completed  2. History of tubular adenomatous and tubulovillous colon polyps. Next colonoscopy due 06/2020.  -Dr. Hilarie Fredrickson to verify if next colonoscopy should be done prior to 06/2020.    3. History of seizures, stable on Keppra and Dilantin. If  the above liver evaluation is negative, it is possible his seizure medications could cause elevated alk phos levels with normal LFTs.    CC:  Ladell Pier, MD

## 2019-11-16 ENCOUNTER — Ambulatory Visit: Payer: Medicare Other | Admitting: Nurse Practitioner

## 2019-11-17 ENCOUNTER — Other Ambulatory Visit: Payer: Self-pay

## 2019-11-17 ENCOUNTER — Ambulatory Visit (INDEPENDENT_AMBULATORY_CARE_PROVIDER_SITE_OTHER): Payer: Medicare Other | Admitting: Nurse Practitioner

## 2019-11-17 ENCOUNTER — Other Ambulatory Visit (INDEPENDENT_AMBULATORY_CARE_PROVIDER_SITE_OTHER): Payer: Medicare Other

## 2019-11-17 ENCOUNTER — Encounter: Payer: Self-pay | Admitting: Nurse Practitioner

## 2019-11-17 VITALS — BP 120/78 | HR 80 | Temp 97.2°F | Ht 69.0 in | Wt 167.0 lb

## 2019-11-17 DIAGNOSIS — Z8619 Personal history of other infectious and parasitic diseases: Secondary | ICD-10-CM

## 2019-11-17 DIAGNOSIS — R748 Abnormal levels of other serum enzymes: Secondary | ICD-10-CM | POA: Diagnosis not present

## 2019-11-17 DIAGNOSIS — Z86010 Personal history of colonic polyps: Secondary | ICD-10-CM

## 2019-11-17 LAB — CBC WITH DIFFERENTIAL/PLATELET
Basophils Absolute: 0 10*3/uL (ref 0.0–0.1)
Basophils Relative: 0.3 % (ref 0.0–3.0)
Eosinophils Absolute: 0.1 10*3/uL (ref 0.0–0.7)
Eosinophils Relative: 1.6 % (ref 0.0–5.0)
HCT: 43.1 % (ref 39.0–52.0)
Hemoglobin: 15 g/dL (ref 13.0–17.0)
Lymphocytes Relative: 26.2 % (ref 12.0–46.0)
Lymphs Abs: 1.3 10*3/uL (ref 0.7–4.0)
MCHC: 34.8 g/dL (ref 30.0–36.0)
MCV: 94.9 fl (ref 78.0–100.0)
Monocytes Absolute: 0.5 10*3/uL (ref 0.1–1.0)
Monocytes Relative: 10.6 % (ref 3.0–12.0)
Neutro Abs: 3.1 10*3/uL (ref 1.4–7.7)
Neutrophils Relative %: 61.3 % (ref 43.0–77.0)
Platelets: 185 10*3/uL (ref 150.0–400.0)
RBC: 4.54 Mil/uL (ref 4.22–5.81)
RDW: 12.7 % (ref 11.5–15.5)
WBC: 5.1 10*3/uL (ref 4.0–10.5)

## 2019-11-17 LAB — BASIC METABOLIC PANEL
BUN: 11 mg/dL (ref 6–23)
CO2: 29 mEq/L (ref 19–32)
Calcium: 9.4 mg/dL (ref 8.4–10.5)
Chloride: 101 mEq/L (ref 96–112)
Creatinine, Ser: 0.85 mg/dL (ref 0.40–1.50)
GFR: 108.77 mL/min (ref >60.00)
Glucose, Bld: 100 mg/dL — ABNORMAL HIGH (ref 70–99)
Potassium: 4.3 mEq/L (ref 3.5–5.1)
Sodium: 138 mEq/L (ref 135–145)

## 2019-11-17 LAB — HEPATIC FUNCTION PANEL
ALT: 20 U/L (ref 0–53)
AST: 15 U/L (ref 0–37)
Albumin: 4.5 g/dL (ref 3.5–5.2)
Alkaline Phosphatase: 163 U/L — ABNORMAL HIGH (ref 39–117)
Bilirubin, Direct: 0.1 mg/dL (ref 0.0–0.3)
Total Bilirubin: 0.5 mg/dL (ref 0.2–1.2)
Total Protein: 7.6 g/dL (ref 6.0–8.3)

## 2019-11-17 LAB — GAMMA GT: GGT: 117 U/L — ABNORMAL HIGH (ref 7–51)

## 2019-11-17 NOTE — Patient Instructions (Signed)
If you are age 67 or older, your body mass index should be between 23-30. Your Body mass index is 24.66 kg/m. If this is out of the aforementioned range listed, please consider follow up with your Primary Care Provider.  If you are age 3 or younger, your body mass index should be between 19-25. Your Body mass index is 24.66 kg/m. If this is out of the aformentioned range listed, please consider follow up with your Primary Care Provider.   Your provider has requested that you go to the basement level for lab work before leaving today. Press "B" on the elevator. The lab is located at the first door on the left as you exit the elevator.  You have been scheduled for an abdominal ultrasound at [facility] (1st floor of hospital) on 11/24/2019 at 9:30 am. Please arrive 15 minutes prior to your appointment for registration. Make certain not to have anything to eat or drink 6 hours prior to your appointment. Should you need to reschedule your appointment, please contact radiology at [phone]. This test typically takes about 30 minutes to perform.  Due to recent changes in healthcare laws, you may see the results of your imaging and laboratory studies on MyChart before your provider has had a chance to review them.  We understand that in some cases there may be results that are confusing or concerning to you. Not all laboratory results come back in the same time frame and the provider may be waiting for multiple results in order to interpret others.  Please give [place] 48 hours in order for your provider to thoroughly review all the results before contacting the office for clarification of your results.   Thank you for choosing [facility] Noralyn Pick, CRNP

## 2019-11-18 NOTE — Progress Notes (Signed)
Addendum: Reviewed and agree with assessment and management plan. Given adenomatous and tubulovillous adenomatous colon polyps he should definitely have surveillance colonoscopy this year, okay to wait till September if he prefers. Aissatou Fronczak, Lajuan Lines, MD

## 2019-11-23 LAB — HEPATITIS C RNA QUANTITATIVE
HCV Quantitative Log: <1.18 Log IU/mL
HCV RNA, PCR, QN: <15 IU/mL

## 2019-11-23 LAB — ANTI-SMOOTH MUSCLE ANTIBODY, IGG: Actin (Smooth Muscle) Antibody (IGG): <20 U (ref <20)

## 2019-11-23 LAB — ANTI-NUCLEAR AB-TITER (ANA TITER): ANA Titer 1: 1:80 {titer} — ABNORMAL HIGH

## 2019-11-23 LAB — EXTRA LAV TOP TUBE

## 2019-11-23 LAB — MITOCHONDRIAL ANTIBODIES: Mitochondrial M2 Ab, IgG: 27.4 U — ABNORMAL HIGH

## 2019-11-23 LAB — ALKALINE PHOSPHATASE ISOENZYMES
Alkaline phosphatase (APISO): 166 U/L — ABNORMAL HIGH (ref 35–144)
Bone Isoenzymes: 30 % (ref 28–66)
Intestinal Isoenzymes: 4 % (ref 1–24)
Liver Isoenzymes: 66 % (ref 25–69)

## 2019-11-23 LAB — ANA: Anti Nuclear Antibody (ANA): POSITIVE — AB

## 2019-11-24 ENCOUNTER — Other Ambulatory Visit: Payer: Self-pay

## 2019-11-24 ENCOUNTER — Ambulatory Visit (HOSPITAL_COMMUNITY)
Admission: RE | Admit: 2019-11-24 | Discharge: 2019-11-24 | Disposition: A | Discharge status: Home / Self Care | Payer: Medicare Other | Source: Ambulatory Visit | Attending: Nurse Practitioner | Admitting: Nurse Practitioner

## 2019-11-24 DIAGNOSIS — Z860100 Personal history of colon polyps, unspecified: Secondary | ICD-10-CM

## 2019-11-24 DIAGNOSIS — R748 Abnormal levels of other serum enzymes: Secondary | ICD-10-CM | POA: Diagnosis not present

## 2019-11-24 DIAGNOSIS — Z8619 Personal history of other infectious and parasitic diseases: Secondary | ICD-10-CM | POA: Diagnosis present

## 2019-11-25 ENCOUNTER — Telehealth: Payer: Self-pay | Admitting: Internal Medicine

## 2019-11-26 NOTE — Telephone Encounter (Signed)
Spoke with pts sister and she is aware and knows to come with pt to his OV.

## 2019-12-10 ENCOUNTER — Ambulatory Visit: Payer: Medicare Other

## 2019-12-14 ENCOUNTER — Ambulatory Visit: Payer: Medicare Other | Attending: Family

## 2019-12-14 DIAGNOSIS — Z23 Encounter for immunization: Secondary | ICD-10-CM | POA: Insufficient documentation

## 2019-12-14 NOTE — Progress Notes (Signed)
   Covid-19 Vaccination Clinic  Name:  [name]    MRN: [id number] DOB: Jul 16, 1951  12/14/2019  Mr. Gulla was observed post Covid-19 immunization for 15 minutes without incidence. He was provided with Vaccine Information Sheet and instruction to access the V-Safe system.   Mr. Tull was instructed to call 911 with any severe reactions post vaccine: Marland Kitchen Difficulty breathing  . Swelling of your face and throat  . A fast heartbeat  . A bad rash all over your body  . Dizziness and weakness    Immunizations Administered    Name Date Dose VIS Date Route   Moderna COVID-19 Vaccine 12/14/2019 10:22 AM 0.5 mL 09/22/2019 Intramuscular   Manufacturer: Moderna   Lot: [id number]   [id number]

## 2019-12-18 ENCOUNTER — Encounter: Payer: Self-pay | Admitting: *Deleted

## 2019-12-22 ENCOUNTER — Other Ambulatory Visit: Payer: Self-pay

## 2019-12-22 ENCOUNTER — Encounter: Payer: Self-pay | Admitting: Internal Medicine

## 2019-12-22 ENCOUNTER — Other Ambulatory Visit (INDEPENDENT_AMBULATORY_CARE_PROVIDER_SITE_OTHER): Payer: Medicare Other

## 2019-12-22 ENCOUNTER — Ambulatory Visit (INDEPENDENT_AMBULATORY_CARE_PROVIDER_SITE_OTHER): Payer: Medicare Other | Admitting: Internal Medicine

## 2019-12-22 VITALS — BP 120/76 | HR 72 | Temp 97.0°F | Ht 69.0 in | Wt 168.0 lb

## 2019-12-22 DIAGNOSIS — R899 Unspecified abnormal finding in specimens from other organs, systems and tissues: Secondary | ICD-10-CM

## 2019-12-22 DIAGNOSIS — R748 Abnormal levels of other serum enzymes: Secondary | ICD-10-CM

## 2019-12-22 DIAGNOSIS — Z86010 Personal history of colonic polyps: Secondary | ICD-10-CM

## 2019-12-22 DIAGNOSIS — Z8619 Personal history of other infectious and parasitic diseases: Secondary | ICD-10-CM | POA: Diagnosis not present

## 2019-12-22 LAB — COMPREHENSIVE METABOLIC PANEL
ALT: 22 U/L (ref 0–53)
AST: 18 U/L (ref 0–37)
Albumin: 4.3 g/dL (ref 3.5–5.2)
Alkaline Phosphatase: 161 U/L — ABNORMAL HIGH (ref 39–117)
BUN: 12 mg/dL (ref 6–23)
CO2: 29 mEq/L (ref 19–32)
Calcium: 9.7 mg/dL (ref 8.4–10.5)
Chloride: 102 mEq/L (ref 96–112)
Creatinine, Ser: 0.88 mg/dL (ref 0.40–1.50)
GFR: 104.47 mL/min (ref >60.00)
Glucose, Bld: 95 mg/dL (ref 70–99)
Potassium: 4.2 mEq/L (ref 3.5–5.1)
Sodium: 137 mEq/L (ref 135–145)
Total Bilirubin: 0.3 mg/dL (ref 0.2–1.2)
Total Protein: 7.9 g/dL (ref 6.0–8.3)

## 2019-12-22 LAB — PROTIME-INR
INR: 1.1 ratio — ABNORMAL HIGH (ref 0.8–1.0)
Prothrombin Time: 12.5 s (ref 9.6–13.1)

## 2019-12-22 NOTE — Progress Notes (Signed)
Subjective:    Patient ID: [name], male    DOB: February 14, 1951, 69 y.o.   MRN: [id number]  HPI Savage Artrip is a 69 year old male with a history of colon polyps, alcoholism, dementia, seizures, prior hepatitis C treated with Harvoni 2 cure (SVR), prior stroke who was seen in January 2021 for elevated alkaline phosphatase.  He is here today with his sister.  He saw Carl Best on 11/17/2019.  He is feeling well today and denies specific complaint.  Denies abdominal pain, jaundice, itching, increasing abdominal swelling or lower extremity edema.  No confusion.  No bleeding.  Reports regular bowel movements without blood in his stool or melena.  He had an evaluation for his elevated alkaline phosphatase and this revealed a positive ANA 1:80, positive antimitochondrial antibody.  Viral hepatitis C quantitative was negative.  Abdominal sonogram was normal.  Review of Systems As per HPI, otherwise negative  Current Medications, Allergies, Past Medical History, Past Surgical History, Family History and Social History were reviewed in [employer] record.     Objective:   Physical Exam BP 120/76   Pulse 72   Temp (!) 97 F (36.1 C)   Ht 5\' 9"  (1.753 m)   Wt 168 lb (76.2 kg)   BMI 24.81 kg/m  Gen: awake, alert, NAD HEENT: anicteric CV: RRR, no mrg Pulm: CTA b/l Abd: soft, NT/ND, +BS throughout Ext: no c/c/e Neuro: nonfocal, no asterixis  ABDOMEN ULTRASOUND COMPLETE   COMPARISON:  06/22/2015   FINDINGS: Gallbladder: No gallstones or wall thickening visualized. No sonographic Murphy sign noted by sonographer.   Common bile duct: Diameter: 7 mm   Liver: No focal lesion identified. Within normal limits in parenchymal echogenicity. Portal vein is patent on color Doppler imaging with normal direction of blood flow towards the liver.   IVC: No abnormality visualized.   Pancreas: Not well visualized.   Spleen: Size and appearance within normal  limits.   Right Kidney: Length: 9.2 cm. Echogenicity within normal limits. No mass or hydronephrosis visualized.   Left Kidney: Length: 10.0 cm. Echogenicity within normal limits. No mass or hydronephrosis visualized.   Abdominal aorta: No aneurysm visualized.   Other findings: None.   IMPRESSION: No gallstones or biliary distention. No focal hepatic abnormality. No acute abnormality.     Electronically Signed   By: Marcello Moores  Register   On: 11/24/2019 14:46   CMP     Component Value Date/Time   NA 137 12/22/2019 1521   NA 141 08/18/2019 1514   K 4.2 12/22/2019 1521   CL 102 12/22/2019 1521   CO2 29 12/22/2019 1521   GLUCOSE 95 12/22/2019 1521   BUN 12 12/22/2019 1521   BUN 12 08/18/2019 1514   CREATININE 0.88 12/22/2019 1521   CREATININE 0.93 04/08/2017 1642   CALCIUM 9.7 12/22/2019 1521   PROT 7.9 12/22/2019 1521   PROT 7.7 10/29/2019 0949   ALBUMIN 4.3 12/22/2019 1521   ALBUMIN 4.9 (H) 10/29/2019 0949   AST 18 12/22/2019 1521   ALT 22 12/22/2019 1521   ALKPHOS 161 (H) 12/22/2019 1521   BILITOT 0.3 12/22/2019 1521   BILITOT 0.5 10/29/2019 0949   GFRNONAA 74 08/18/2019 1514   GFRNONAA 86 04/08/2017 1642   GFRAA 85 08/18/2019 1514   GFRAA >89 04/08/2017 1642          Assessment & Plan:  69 year old male with a history of colon polyps, alcoholism, dementia, seizures, prior hepatitis C treated with Harvoni 2 cure (SVR),  prior stroke who was seen in January 2021 for elevated alkaline phosphatase.   1.  Elevated ALT and phosphatase/possible primary biliary cholangitis --he does have an elevated alkaline phosphatase in the setting of a positive antimitochondrial antibody and anti-smooth muscle antibody.  His bilirubin and albumin are normal.  There is no evidence for decompensated liver disease or cirrhosis.  I do think it is quite possible that he has primary biliary cholangitis and I recommended that we perform cross-sectional imaging and then consider  ursodiol. --MRI abdomen with and without contrast plus MRCP --Probable ursodiol but will review MRI results first  2.  History of adenomatous and tubulovillous adenoma of the colon --surveillance colonoscopy recommended in September 2021, this is 3 years from last exam based on history of tubulovillous adenoma  3.  History of hepatitis C --treated with sustained virologic response, quantitative viral load negative recently  30 minutes total spent today including patient facing time, coordination of care, reviewing medical history/procedures/pertinent radiology studies, and documentation of the encounter.

## 2019-12-22 NOTE — Patient Instructions (Addendum)
Your provider has requested that you go to the basement level for lab work before leaving today. Press "B" on the elevator. The lab is located at the first door on the left as you exit the elevator. __________________________________________________________ Dennis Bast have been scheduled for an MRI/MRCP abdomen at [facility] on Friday, 12/24/19. Your appointment time is 4:00 pm. Please arrive 30 minutes prior to your appointment time for registration purposes. Please make certain not to have anything to eat or drink 6 hours prior to your test. In addition, if you have any metal in your body, have a pacemaker or defibrillator, please be sure to let your ordering physician know. This test typically takes 45 minutes to 1 hour to complete. Should you need to reschedule, please call [phone] to do so. ___________________________________________________________ If you are age 51 or older, your body mass index should be between 23-30. Your Body mass index is 24.81 kg/m. If this is out of the aforementioned range listed, please consider follow up with your Primary Care Provider.  If you are age 25 or younger, your body mass index should be between 19-25. Your Body mass index is 24.81 kg/m. If this is out of the aformentioned range listed, please consider follow up with your Primary Care Provider.  ____________________________________________________________ Due to recent changes in healthcare laws, you may see the results of your imaging and laboratory studies on MyChart before your provider has had a chance to review them.  We understand that in some cases there may be results that are confusing or concerning to you. Not all laboratory results come back in the same time frame and the provider may be waiting for multiple results in order to interpret others.  Please give [place] 48 hours in order for your provider to thoroughly review all the results before contacting the office for clarification of your results.

## 2020-01-01 ENCOUNTER — Other Ambulatory Visit: Payer: Self-pay

## 2020-01-01 ENCOUNTER — Other Ambulatory Visit: Payer: Self-pay | Admitting: Internal Medicine

## 2020-01-01 ENCOUNTER — Ambulatory Visit (HOSPITAL_COMMUNITY)
Admission: RE | Admit: 2020-01-01 | Discharge: 2020-01-01 | Disposition: A | Discharge status: Home / Self Care | Payer: Medicare Other | Source: Ambulatory Visit | Attending: Internal Medicine | Admitting: Internal Medicine

## 2020-01-01 DIAGNOSIS — R899 Unspecified abnormal finding in specimens from other organs, systems and tissues: Secondary | ICD-10-CM | POA: Diagnosis present

## 2020-01-01 DIAGNOSIS — R748 Abnormal levels of other serum enzymes: Secondary | ICD-10-CM | POA: Diagnosis present

## 2020-01-01 MED ORDER — GADOBUTROL 1 MMOL/ML IV SOLN
7.5000 mL | Freq: Once | INTRAVENOUS | Status: AC | PRN
  Administered 2020-01-01: 7.5 mL via INTRAVENOUS

## 2020-01-05 ENCOUNTER — Other Ambulatory Visit: Payer: Self-pay

## 2020-01-05 DIAGNOSIS — R899 Unspecified abnormal finding in specimens from other organs, systems and tissues: Secondary | ICD-10-CM

## 2020-01-05 DIAGNOSIS — R748 Abnormal levels of other serum enzymes: Secondary | ICD-10-CM

## 2020-01-05 MED ORDER — URSODIOL 250 MG PO TABS: 500.0000 mg | ORAL_TABLET | Freq: Two times a day (BID) | ORAL | 3 refills | Status: DC

## 2020-01-12 ENCOUNTER — Ambulatory Visit: Payer: Medicare Other | Attending: Family

## 2020-01-12 DIAGNOSIS — Z23 Encounter for immunization: Secondary | ICD-10-CM

## 2020-01-12 NOTE — Progress Notes (Signed)
   Covid-19 Vaccination Clinic  Name:  [name]    MRN: [id number] DOB: 01-Mar-1951  01/12/2020  [name] was observed post Covid-19 immunization for 15 minutes without incident. He was provided with Vaccine Information Sheet and instruction to access the V-Safe system.   [name] was instructed to call 911 with any severe reactions post vaccine: Marland Kitchen Difficulty breathing  . Swelling of face and throat  . A fast heartbeat  . A bad rash all over body  . Dizziness and weakness   Immunizations Administered    Name Date Dose VIS Date Route   Moderna COVID-19 Vaccine 01/12/2020 11:13 AM 0.5 mL 09/22/2019 Intramuscular   Manufacturer: Moderna   Lot: [id number]   [id number]

## 2020-02-13 ENCOUNTER — Other Ambulatory Visit: Payer: Self-pay | Admitting: Neurology

## 2020-02-18 ENCOUNTER — Other Ambulatory Visit: Payer: Self-pay

## 2020-02-18 ENCOUNTER — Encounter: Payer: Self-pay | Admitting: Neurology

## 2020-02-18 ENCOUNTER — Ambulatory Visit (INDEPENDENT_AMBULATORY_CARE_PROVIDER_SITE_OTHER): Payer: Medicare Other | Admitting: Neurology

## 2020-02-18 VITALS — BP 109/61 | HR 76 | Temp 97.3°F | Ht 69.0 in | Wt 166.0 lb

## 2020-02-18 DIAGNOSIS — R569 Unspecified convulsions: Secondary | ICD-10-CM

## 2020-02-18 DIAGNOSIS — R269 Unspecified abnormalities of gait and mobility: Secondary | ICD-10-CM | POA: Diagnosis not present

## 2020-02-18 MED ORDER — LEVETIRACETAM 750 MG PO TABS: 1500.0000 mg | ORAL_TABLET | Freq: Two times a day (BID) | ORAL | 3 refills | Status: DC

## 2020-02-18 MED ORDER — PHENYTOIN SODIUM EXTENDED 100 MG PO CAPS: 200.0000 mg | ORAL_CAPSULE | Freq: Every day | ORAL | 1 refills | Status: DC

## 2020-02-18 MED ORDER — PHENYTOIN 50 MG PO CHEW: 25.0000 mg | CHEWABLE_TABLET | Freq: Every day | ORAL | 1 refills | Status: DC

## 2020-02-18 NOTE — Patient Instructions (Signed)
Continue current medications Check blood work today  See you in 6 months

## 2020-02-18 NOTE — Progress Notes (Signed)
I have read the note, and I agree with the clinical assessment and plan.  Romina Divirgilio K Lynsay Fesperman   

## 2020-02-18 NOTE — Progress Notes (Signed)
PATIENT: ZAKHI UYEHARA DOB: 12-26-1950  REASON FOR VISIT: follow up HISTORY FROM: patient  HISTORY OF PRESENT ILLNESS: Today 02/18/20  Mr. Klimas is a 69 year old male with history of seizures and memory disorder.  He was started on Dilantin around April 2020 after reported seizures (frequent staring spells every 2 weeks, 1 episode lying down, his whole body drew up).  He has been established at a good maintenance dose of Dilantin 225 mg daily, along with Keppra 1500 mg daily.  Since last seen, his sister reports 1-2 episodes of brief staring episodes, unsure exactly if these represent true seizures.  He lives with his sister, [name].  She manages his medications and helps with his care.  He has a left hemiparesis.  He uses a walker.  He has seen GI for elevated alkaline phosphatase, in the setting of a positive antimitochondrial antibody and anti-smooth muscle antibody, undergoing evaluation. Is overall doing well with medications, is overall pleased, no recent falls.  HISTORY 08/18/2019 SS: Mr. Hartling is a 69 year old male with history of seizures and memory disorder.  After his last visit in April, he had reported recurrent seizures.  We restarted his Dilantin, and established a good maintenance dose of 225 mg daily, along with Keppra 1500 mg daily.  He has not had recurrent seizure since last seen.  He continues to live with his sister, [name].  His sister manages his medications and helps with his care.  In the home he is able to perform his ADLs, but requires some assistance with bathing.  During the day, he is not very active and often naps.  He enjoys watching TV, and walks around the home with his rolling walker.  He has not had any recent falls.  He does not drive a car.  He does have a left hemiparesis.  He presents today for follow-up accompanied by his sister. He is tolerating his medications well without side effect.  He denies any new problems or concerns.   REVIEW OF SYSTEMS: Out of a  complete 14 system review of symptoms, the patient complains only of the following symptoms, and all other reviewed systems are negative.  Seizure   ALLERGIES: No Known Allergies  HOME MEDICATIONS: Outpatient Medications Prior to Visit  Medication Sig Dispense Refill  . memantine (NAMENDA) 10 MG tablet TAKE 1 TABLET BY MOUTH TWICE A DAY 180 tablet 3  . metoprolol succinate (TOPROL-XL) 25 MG 24 hr tablet Take 1 tablet (25 mg total) by mouth daily. 90 tablet 2  . simvastatin (ZOCOR) 20 MG tablet Take 1 tablet (20 mg total) by mouth daily at 6 PM. 90 tablet 2  . levETIRAcetam (KEPPRA) 750 MG tablet Take 2 tablets (1,500 mg total) by mouth 2 (two) times daily. 360 tablet 3  . phenytoin (DILANTIN) 100 MG ER capsule Take 2 capsules (200 mg total) by mouth at bedtime. 180 capsule 1  . phenytoin (PHENYTOIN INFATABS) 50 MG tablet Chew 0.5 tablets (25 mg total) by mouth at bedtime. 45 tablet 1   No facility-administered medications prior to visit.    PAST MEDICAL HISTORY: Past Medical History:  Diagnosis Date  . Alcoholism ([place])    History of, not active  . Chronic hepatitis C ([place])   . Dementia ([place])   . Diverticulosis   . Gait disorder   . 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 Hemiparesis and alteration of sensations as late effects of stroke ([place]) 01/25/2017   Left hemiparesis  .  Hiatal hernia   . History of alcoholism ([place])   . Hx of ischemic vertebrobasilar artery thalamic stroke    Right  . Internal hemorrhoids   . Memory disorder 03/04/2013  . Seizures ([place])   . Stroke [facility])    Right frontal, right thalamic  . Tubular adenoma of colon   . Ulnar neuropathy of left upper extremity     PAST SURGICAL HISTORY: Past Surgical History:  Procedure Laterality Date  . COLONOSCOPY WITH PROPOFOL N/A 07/10/2017   Procedure: COLONOSCOPY WITH PROPOFOL;  Surgeon: Otis Brace, MD;  Location: [place];  Service: Gastroenterology;  Laterality: N/A;  .  ESOPHAGOGASTRODUODENOSCOPY (EGD) WITH PROPOFOL N/A 07/10/2017   Procedure: ESOPHAGOGASTRODUODENOSCOPY (EGD) WITH PROPOFOL;  Surgeon: Otis Brace, MD;  Location: [facility];  Service: Gastroenterology;  Laterality: N/A;  . HIP ARTHROPLASTY Left 11/26/2013   Procedure: LEFT HIP HEMIARTHROPLASTY;  Surgeon: Mcarthur Rossetti, MD;  Location: [place];  Service: Orthopedics;  Laterality: Left;  . WRIST SURGERY Left 95 & 96    FAMILY HISTORY: Family History  Problem Relation Age of Onset  . Pulmonary embolism Mother   . Liver disease Father   . Diabetes Sister   . Hypertension Sister   . Hypertension Sister   . Hypertension Sister   . Seizures Neg Hx     SOCIAL HISTORY: Social History   Socioeconomic History  . Marital status: Divorced    Spouse name: Not on file  . Number of children: 0  . Years of education: 10  . Highest education level: Not on file  Occupational History  . Not on file  Tobacco Use  . Smoking status: Former Smoker    Packs/day: 3.00    Quit date: 01/02/2014    Years since quitting: 6.1  . Smokeless tobacco: Former Network engineer and Sexual Activity  . Alcohol use: No    Alcohol/week: 0.0 standard drinks    Comment: Former Alcoholic  . Drug use: No  . Sexual activity: Yes  Other Topics Concern  . Not on file  Social History Narrative   Patient is single and lives with a friend and her son.   Patient does not have any children.   Patient is disabled.   Patient has a 10 th grade education.   Patient is left-handed.   Patient drinks some soda but not everyday.   Social Determinants of Health   Financial Resource Strain:   . Difficulty of Paying Living Expenses:   Food Insecurity:   . Worried About Charity fundraiser in the Last Year:   . Arboriculturist in the Last Year:   Transportation Needs:   . Film/video editor (Medical):   Marland Kitchen Lack of Transportation (Non-Medical):   Physical Activity:   . Days of Exercise per Week:   . Minutes  of Exercise per Session:   Stress:   . Feeling of Stress :   Social Connections:   . Frequency of Communication with Friends and Family:   . Frequency of Social Gatherings with Friends and Family:   . Attends Religious Services:   . Active Member of Clubs or Organizations:   . Attends Archivist Meetings:   Marland Kitchen Marital Status:   Intimate Partner Violence:   . Fear of Current or Ex-Partner:   . Emotionally Abused:   Marland Kitchen Physically Abused:   . Sexually Abused:    PHYSICAL EXAM  Vitals:   02/18/20 1109  BP: 109/61  Pulse: 76  Temp: Marland Kitchen)  97.3 F (36.3 C)  Weight: 166 lb (75.3 kg)  Height: 5\' 9"  (1.753 m)   Body mass index is 24.51 kg/m.  Generalized: Well developed, in no acute distress   Neurological examination  Mentation: Alert, history is mostly provided by his sister. Follows all commands speech and language fluent, well appearing and groomed Cranial nerve II-XII: Pupils were equal round reactive to light. Extraocular movements were full, visual field were full on confrontational test. Facial sensation and strength were normal. Head turning and shoulder shrug were normal and symmetric. Motor: Good strength of all extremities Sensory: Sensory testing is intact to soft touch on all 4 extremities. No evidence of extinction is noted.  Coordination: Mild dysmetria on the left finger nose finger Gait and station: Rises from seated position with pushoff, ambulates with rolling walker, circumduction type gait with the left Reflexes: Deep tendon reflexes are symmetric but depressed bilaterally  DIAGNOSTIC DATA (LABS, IMAGING, TESTING) - I reviewed patient records, labs, notes, testing and imaging myself where available.  Lab Results  Component Value Date   WBC 5.1 11/17/2019   HGB 15.0 11/17/2019   HCT 43.1 11/17/2019   MCV 94.9 11/17/2019   PLT 185.0 11/17/2019      Component Value Date/Time   NA 137 12/22/2019 1521   NA 141 08/18/2019 1514   K 4.2 12/22/2019  1521   CL 102 12/22/2019 1521   CO2 29 12/22/2019 1521   GLUCOSE 95 12/22/2019 1521   BUN 12 12/22/2019 1521   BUN 12 08/18/2019 1514   CREATININE 0.88 12/22/2019 1521   CREATININE 0.93 04/08/2017 1642   CALCIUM 9.7 12/22/2019 1521   PROT 7.9 12/22/2019 1521   PROT 7.7 10/29/2019 0949   ALBUMIN 4.3 12/22/2019 1521   ALBUMIN 4.9 (H) 10/29/2019 0949   AST 18 12/22/2019 1521   ALT 22 12/22/2019 1521   ALKPHOS 161 (H) 12/22/2019 1521   BILITOT 0.3 12/22/2019 1521   BILITOT 0.5 10/29/2019 0949   GFRNONAA 74 08/18/2019 1514   GFRNONAA 86 04/08/2017 1642   GFRAA 85 08/18/2019 1514   GFRAA >89 04/08/2017 1642   Lab Results  Component Value Date   CHOL 126 09/11/2019   HDL 30 (L) 09/11/2019   LDLCALC 75 09/11/2019   TRIG 113 09/11/2019   CHOLHDL 4.2 09/11/2019   Lab Results  Component Value Date   HGBA1C 5.3 05/23/2016   No results found for: [id number] Lab Results  Component Value Date   TSH 2.292 03/02/2015      ASSESSMENT AND PLAN 69 y.o. year old male  has a past medical history of Alcoholism ([place]), Chronic hepatitis C ([place]), Dementia ([place]), Diverticulosis, Gait disorder, Gingival hypertrophy, Hemiparesis and alteration of sensations as late effects of stroke ([place]) (01/25/2017), Hiatal hernia, History of alcoholism ([place]), ischemic vertebrobasilar artery thalamic stroke, Internal hemorrhoids, Memory disorder (03/04/2013), Seizures ([place]), Stroke ([place]), Tubular adenoma of colon, and Ulnar neuropathy of left upper extremity. here with:  1.  Cerebrovascular disease, left hemiparesis 2.  Seizure disorder 3.  Gait disorder 4.  Memory disorder  Since last seen, his sister reports possibly a few very brief staring episodes.  He has overall done much better with the addition of Dilantin last year.  I will check blood work today, he is undergoing evaluation with GI for elevated alkaline phosphatase (related to factors other than Dilantin?).  We have found a good maintenance level of  Dilantin at 225 mg at bedtime, along with Keppra 1500 mg twice a day. If the  Dilantin level is therapeutic, we will likely continue at current dose.  If his sister notices an increase in seizure activity, she will contact me.  They will return in 6 months or sooner if needed.  I spent 30 minutes of face-to-face and non-face-to-face time with patient.  This included previsit chart review, lab review, study review, order entry, electronic health record documentation, patient education.  Butler Denmark, AGNP-C, DNP 02/18/2020, 12:11 PM [facility] [address] [place], [place] [address] [phone]

## 2020-02-19 LAB — COMPREHENSIVE METABOLIC PANEL
ALT: 10 IU/L (ref 0–44)
AST: 13 IU/L (ref 0–40)
Albumin/Globulin Ratio: 1.6 (ref 1.2–2.2)
Albumin: 4.5 g/dL (ref 3.8–4.8)
Alkaline Phosphatase: 170 IU/L — ABNORMAL HIGH (ref 39–117)
BUN/Creatinine Ratio: 15 (ref 10–24)
BUN: 15 mg/dL (ref 8–27)
Bilirubin Total: 0.4 mg/dL (ref 0.0–1.2)
CO2: 25 mmol/L (ref 20–29)
Calcium: 9.7 mg/dL (ref 8.6–10.2)
Chloride: 100 mmol/L (ref 96–106)
Creatinine, Ser: 1 mg/dL (ref 0.76–1.27)
GFR calc Af Amer: 90 mL/min/{1.73_m2} (ref >59)
GFR calc non Af Amer: 78 mL/min/{1.73_m2} (ref >59)
Globulin, Total: 2.8 g/dL (ref 1.5–4.5)
Glucose: 91 mg/dL (ref 65–99)
Potassium: 5.3 mmol/L — ABNORMAL HIGH (ref 3.5–5.2)
Sodium: 139 mmol/L (ref 134–144)
Total Protein: 7.3 g/dL (ref 6.0–8.5)

## 2020-02-19 LAB — CBC WITH DIFFERENTIAL/PLATELET
Basophils Absolute: 0 10*3/uL (ref 0.0–0.2)
Basos: 0 %
EOS (ABSOLUTE): 0.1 10*3/uL (ref 0.0–0.4)
Eos: 2 %
Hematocrit: 42.9 % (ref 37.5–51.0)
Hemoglobin: 15 g/dL (ref 13.0–17.7)
Immature Grans (Abs): 0 10*3/uL (ref 0.0–0.1)
Immature Granulocytes: 0 %
Lymphocytes Absolute: 1.4 10*3/uL (ref 0.7–3.1)
Lymphs: 28 %
MCH: 33 pg (ref 26.6–33.0)
MCHC: 35 g/dL (ref 31.5–35.7)
MCV: 95 fL (ref 79–97)
Monocytes Absolute: 0.6 10*3/uL (ref 0.1–0.9)
Monocytes: 11 %
Neutrophils Absolute: 2.9 10*3/uL (ref 1.4–7.0)
Neutrophils: 59 %
Platelets: 184 10*3/uL (ref 150–450)
RBC: 4.54 x10E6/uL (ref 4.14–5.80)
RDW: 12.4 % (ref 11.6–15.4)
WBC: 4.9 10*3/uL (ref 3.4–10.8)

## 2020-02-19 LAB — PHENYTOIN LEVEL, TOTAL: Phenytoin (Dilantin), Serum: 13.3 ug/mL (ref 10.0–20.0)

## 2020-02-20 DEATH — deceased

## 2020-02-22 ENCOUNTER — Telehealth: Payer: Self-pay

## 2020-02-22 NOTE — Telephone Encounter (Signed)
Pt verified by name and DOB, results given per provider, pt voiced understanding all question answered. 

## 2020-02-23 ENCOUNTER — Telehealth: Payer: Self-pay | Admitting: *Deleted

## 2020-02-23 NOTE — Telephone Encounter (Signed)
Sister, leslie called to let us know that pt is on Ursodiol 500mg  po bid for his liver.  FYI

## 2020-02-23 NOTE — Telephone Encounter (Signed)
Noted, I don't see any interaction between Dilantin.

## 2020-02-25 ENCOUNTER — Ambulatory Visit: Payer: Medicare Other | Admitting: Internal Medicine

## 2020-03-04 ENCOUNTER — Other Ambulatory Visit: Payer: Self-pay

## 2020-03-04 ENCOUNTER — Ambulatory Visit: Payer: Medicare Other | Attending: Internal Medicine | Admitting: Internal Medicine

## 2020-03-04 DIAGNOSIS — I10 Essential (primary) hypertension: Secondary | ICD-10-CM | POA: Diagnosis not present

## 2020-03-04 DIAGNOSIS — R569 Unspecified convulsions: Secondary | ICD-10-CM | POA: Diagnosis not present

## 2020-03-04 DIAGNOSIS — K743 Primary biliary cirrhosis: Secondary | ICD-10-CM | POA: Diagnosis not present

## 2020-03-04 DIAGNOSIS — I6930 Unspecified sequelae of cerebral infarction: Secondary | ICD-10-CM | POA: Diagnosis not present

## 2020-03-04 MED ORDER — ASPIRIN EC 81 MG PO TBEC: 81.0000 mg | DELAYED_RELEASE_TABLET | Freq: Every day | ORAL | 0 refills | Status: AC

## 2020-03-04 NOTE — Progress Notes (Signed)
Virtual Visit via Telephone Note Due to current restrictions/limitations of in-office visits due to the COVID-19 pandemic, this scheduled clinical appointment was converted to a telehealth visit  I connected with [name] on 03/04/20 at 10:33 a.m by telephone and verified that I am speaking with the correct person using two identifiers. I am in my office.  The patient is at home.  Only the patient, sister [name] and myself participated in this encounter.  I discussed the limitations, risks, security and privacy concerns of performing an evaluation and management service by telephone and the availability of in person appointments. I also discussed with the patient that there may be a patient responsible charge related to this service. The patient expressed understanding and agreed to proceed.   History of Present Illness: Ptwith hxof hepatitis C treated, szdisorder, CVA with residual left-sided weakness uses a rolling walker, HTN, memory deficit (followed by neurology), tubular adenomas.Last seen 08/2019.  Pt seen by GI Dr. Raquel James since last visit with me for evaluation of elevated alkaline phosphatase.  He was found to have positive antimitochondrial and anti-smooth muscle antibody.  Diagnosis is possible PBC.  Patient is now on Ursodiol.  HYPERTENSION Currently taking: see medication list.  He is on metoprolol. Med Adherence: [x]  Yes    []  No Medication side effects: []  Yes    [x]  No Adherence with salt restriction: [x]  Yes    []  No Home Monitoring?: [x]  Yes    []  No Monitoring Frequency:  2-3 x a wk Home BP results range:  Most recent reading was 130/70 SOB? []  Yes    [x]  No Chest Pain?: []  Yes    [x]  No Leg swelling?: []  Yes    [x]  No Headaches?: []  Yes    [x]  No Dizziness? []  Yes    [x]  No Comments:   SZ disorders/CVA: saw neurology last mth He was on ASA but sister states he has not taken in 5-6 mths because sister does not think he had a stroke.  She thinks he had a sz  and the neurologist thought it was a stroke. -sister requesting an aide for him.  He lives with her but she is now taking care of someone else outside the home.  He needs help with baths and putting clothes on, meal preps especially breakfast.  She is there to fix lunch.    Outpatient Encounter Medications as of 03/04/2020  Medication Sig  . levETIRAcetam (KEPPRA) 750 MG tablet Take 2 tablets (1,500 mg total) by mouth 2 (two) times daily.  . memantine (NAMENDA) 10 MG tablet TAKE 1 TABLET BY MOUTH TWICE A DAY  . metoprolol succinate (TOPROL-XL) 25 MG 24 hr tablet Take 1 tablet (25 mg total) by mouth daily.  . phenytoin (DILANTIN) 100 MG ER capsule Take 2 capsules (200 mg total) by mouth at bedtime.  . phenytoin (PHENYTOIN INFATABS) 50 MG tablet Chew 0.5 tablets (25 mg total) by mouth at bedtime.  . simvastatin (ZOCOR) 20 MG tablet Take 1 tablet (20 mg total) by mouth daily at 6 PM.  . URSODIOL PO Take 500 mg by mouth in the morning and at bedtime.   No facility-administered encounter medications on file as of 03/04/2020.    Observations/Objective: Results for orders placed or performed in visit on 02/18/20  Phenytoin Level, Total  Result Value Ref Range   Phenytoin (Dilantin), Serum 13.3 10.0 - 20.0 ug/mL  CBC with Differential/Platelet  Result Value Ref Range   WBC 4.9 3.4 - 10.8 x10E3/uL  RBC 4.54 4.14 - 5.80 x10E6/uL   Hemoglobin 15.0 13.0 - 17.7 g/dL   Hematocrit 42.9 37.5 - 51.0 %   MCV 95 79 - 97 fL   MCH 33.0 26.6 - 33.0 pg   MCHC 35.0 31.5 - 35.7 g/dL   RDW 12.4 11.6 - 15.4 %   Platelets 184 150 - 450 x10E3/uL   Neutrophils 59 Not Estab. %   Lymphs 28 Not Estab. %   Monocytes 11 Not Estab. %   Eos 2 Not Estab. %   Basos 0 Not Estab. %   Neutrophils Absolute 2.9 1.4 - 7.0 x10E3/uL   Lymphocytes Absolute 1.4 0.7 - 3.1 x10E3/uL   Monocytes Absolute 0.6 0.1 - 0.9 x10E3/uL   EOS (ABSOLUTE) 0.1 0.0 - 0.4 x10E3/uL   Basophils Absolute 0.0 0.0 - 0.2 x10E3/uL   Immature  Granulocytes 0 Not Estab. %   Immature Grans (Abs) 0.0 0.0 - 0.1 x10E3/uL  CMP  Result Value Ref Range   Glucose 91 65 - 99 mg/dL   BUN 15 8 - 27 mg/dL   Creatinine, Ser 1.00 0.76 - 1.27 mg/dL   GFR calc non Af Amer 78 >59 mL/min/1.73   GFR calc Af Amer 90 >59 mL/min/1.73   BUN/Creatinine Ratio 15 10 - 24   Sodium 139 134 - 144 mmol/L   Potassium 5.3 (H) 3.5 - 5.2 mmol/L   Chloride 100 96 - 106 mmol/L   CO2 25 20 - 29 mmol/L   Calcium 9.7 8.6 - 10.2 mg/dL   Total Protein 7.3 6.0 - 8.5 g/dL   Albumin 4.5 3.8 - 4.8 g/dL   Globulin, Total 2.8 1.5 - 4.5 g/dL   Albumin/Globulin Ratio 1.6 1.2 - 2.2   Bilirubin Total 0.4 0.0 - 1.2 mg/dL   Alkaline Phosphatase 170 (H) 39 - 117 IU/L   AST 13 0 - 40 IU/L   ALT 10 0 - 44 IU/L     Assessment and Plan: 1. Essential hypertension At goal.  Continue metoprolol and low-salt diet  2. Primary biliary cholangitis (HCC) Followed by GI.  3. Seizure ([place]) Stable on Keppra on Dilantin  4. History of cerebrovascular accident (CVA) with residual deficit Advised patient and sister that he should be on aspirin 81 mg daily.  CAT scan of his head from 2016 does reveal changes consistent with prior stroke.  She plans to purchase the aspirin over-the-counter. I will submit the referral for home health aide.   Follow Up Instructions: 4 mths   I discussed the assessment and treatment plan with the patient. The patient was provided an opportunity to ask questions and all were answered. The patient agreed with the plan and demonstrated an understanding of the instructions.   The patient was advised to call back or seek an in-person evaluation if the symptoms worsen or if the condition fails to improve as anticipated.  I provided 13 minutes of non-face-to-face time during this encounter.   Karle Plumber, MD

## 2020-03-04 NOTE — Progress Notes (Signed)
He is requesting an Aid to come to the house to assist.

## 2020-03-08 ENCOUNTER — Telehealth: Payer: Self-pay

## 2020-03-08 NOTE — Telephone Encounter (Signed)
Call placed to patient to discuss PCS and home health services.his nephew, Heloise Beecham answered and asked that this CM call back in an hour.   1500- Call placed to patient again # [phone], message left with call back requested to this CM. Call also placed to patient's sister, [name] # [phone], message also left with call back requested to this CM.

## 2020-03-10 NOTE — Telephone Encounter (Signed)
Call placed to patient to discuss PCS and home health services. Call placed to patient again # [phone], message left with call back requested to this CM. Call also placed to patient's sister, [name] # [phone], the person who answered the phone did not understand what this CM was saying. Speech   Will need to call back at another time if the call is not returned from the message that was left for the patient.

## 2020-03-16 ENCOUNTER — Telehealth: Payer: Self-pay

## 2020-03-16 NOTE — Telephone Encounter (Signed)
Call placed to patient # [phone], spoke to nephew, Rochel Brome. He said that [name] may have a different phone number from what is listed and he will need to check on that. Explained to him the difference between home health and PCS.  He felt that PCS would be a good option for patient. He said that he or [name] would be the contacts and at this time could both be reached at # [phone].  PCs referral to Dr Wynetta Emery for signature

## 2020-03-17 ENCOUNTER — Telehealth: Payer: Self-pay

## 2020-03-17 NOTE — Telephone Encounter (Signed)
PCS application faxed to [facility] 

## 2020-04-01 ENCOUNTER — Telehealth: Payer: Self-pay

## 2020-04-01 NOTE — Telephone Encounter (Signed)
[employer] will send a letter to [facility] for provider to fill out for hours and 3 days a week.  Call [phone]  Destiny, sister. Per sister.

## 2020-04-13 ENCOUNTER — Telehealth: Payer: Self-pay

## 2020-04-13 NOTE — Telephone Encounter (Signed)
Call placed to [facility], spoke to [place], who confirmed that the [facility] referral has been processed and he has been approved for services.  He could not confirm the number of hours approved.

## 2020-04-16 ENCOUNTER — Other Ambulatory Visit: Payer: Self-pay | Admitting: Internal Medicine

## 2020-06-15 ENCOUNTER — Encounter: Payer: Self-pay | Admitting: Internal Medicine

## 2020-06-15 ENCOUNTER — Ambulatory Visit (INDEPENDENT_AMBULATORY_CARE_PROVIDER_SITE_OTHER): Payer: Medicare Other | Admitting: Internal Medicine

## 2020-06-15 ENCOUNTER — Other Ambulatory Visit (INDEPENDENT_AMBULATORY_CARE_PROVIDER_SITE_OTHER): Payer: Medicare Other

## 2020-06-15 VITALS — BP 110/70 | HR 72 | Ht 69.0 in | Wt 171.0 lb

## 2020-06-15 DIAGNOSIS — Z86010 Personal history of colonic polyps: Secondary | ICD-10-CM

## 2020-06-15 DIAGNOSIS — K743 Primary biliary cirrhosis: Secondary | ICD-10-CM

## 2020-06-15 DIAGNOSIS — R748 Abnormal levels of other serum enzymes: Secondary | ICD-10-CM | POA: Diagnosis not present

## 2020-06-15 DIAGNOSIS — R899 Unspecified abnormal finding in specimens from other organs, systems and tissues: Secondary | ICD-10-CM

## 2020-06-15 LAB — COMPREHENSIVE METABOLIC PANEL
ALT: 7 U/L (ref 0–53)
AST: 11 U/L (ref 0–37)
Albumin: 4.4 g/dL (ref 3.5–5.2)
Alkaline Phosphatase: 135 U/L — ABNORMAL HIGH (ref 39–117)
BUN: 11 mg/dL (ref 6–23)
CO2: 30 mEq/L (ref 19–32)
Calcium: 10 mg/dL (ref 8.4–10.5)
Chloride: 102 mEq/L (ref 96–112)
Creatinine, Ser: 0.96 mg/dL (ref 0.40–1.50)
GFR: 94.35 mL/min (ref >60.00)
Glucose, Bld: 98 mg/dL (ref 70–99)
Potassium: 4.9 mEq/L (ref 3.5–5.1)
Sodium: 139 mEq/L (ref 135–145)
Total Bilirubin: 0.5 mg/dL (ref 0.2–1.2)
Total Protein: 7.8 g/dL (ref 6.0–8.3)

## 2020-06-15 LAB — HEPATIC FUNCTION PANEL
ALT: 7 U/L (ref 0–53)
AST: 11 U/L (ref 0–37)
Albumin: 4.4 g/dL (ref 3.5–5.2)
Alkaline Phosphatase: 135 U/L — ABNORMAL HIGH (ref 39–117)
Bilirubin, Direct: 0.1 mg/dL (ref 0.0–0.3)
Total Bilirubin: 0.5 mg/dL (ref 0.2–1.2)
Total Protein: 7.8 g/dL (ref 6.0–8.3)

## 2020-06-15 LAB — PROTIME-INR
INR: 1.2 ratio — ABNORMAL HIGH (ref 0.8–1.0)
Prothrombin Time: 12.9 s (ref 9.6–13.1)

## 2020-06-15 LAB — CBC
HCT: 43.5 % (ref 39.0–52.0)
Hemoglobin: 15 g/dL (ref 13.0–17.0)
MCHC: 34.6 g/dL (ref 30.0–36.0)
MCV: 96.3 fl (ref 78.0–100.0)
Platelets: 175 10*3/uL (ref 150.0–400.0)
RBC: 4.52 Mil/uL (ref 4.22–5.81)
RDW: 12.6 % (ref 11.5–15.5)
WBC: 5.4 10*3/uL (ref 4.0–10.5)

## 2020-06-15 MED ORDER — SUTAB 1479-225-188 MG PO TABS: 1.0000 | ORAL_TABLET | ORAL | 0 refills | Status: DC

## 2020-06-15 NOTE — Patient Instructions (Addendum)
Your provider has requested that you go to the basement level for lab work before leaving today. Press "B" on the elevator. The lab is located at the first door on the left as you exit the elevator.  You have been scheduled for an endoscopy and colonoscopy. Please follow the written instructions given to you at your visit today. Please pick up your prep supplies at the pharmacy within the next 1-3 days. If you use inhalers (even only as needed), please bring them with you on the day of your procedure.  Continue ursodiol 500 mg twice daily.  If you are age 43 or older, your body mass index should be between 23-30. Your Body mass index is 25.25 kg/m. If this is out of the aforementioned range listed, please consider follow up with your Primary Care Provider.  If you are age 61 or younger, your body mass index should be between 19-25. Your Body mass index is 25.25 kg/m. If this is out of the aformentioned range listed, please consider follow up with your Primary Care Provider.   Due to recent changes in healthcare laws, you may see the results of your imaging and laboratory studies on MyChart before your provider has had a chance to review them.  We understand that in some cases there may be results that are confusing or concerning to you. Not all laboratory results come back in the same time frame and the provider may be waiting for multiple results in order to interpret others.  Please give [place] 48 hours in order for your provider to thoroughly review all the results before contacting the office for clarification of your results.

## 2020-06-15 NOTE — Progress Notes (Signed)
Subjective:    Patient ID: [name], male    DOB: [birth year], 69 y.o.   MRN: [id number]  HPI [name] is a 69 year old male with a history of colon polyps, elevated alkaline phosphatase with history of antimitochondrial antibody positive, prior viral hepatitis C treated with Harvoni with SVR, history of dementia, history of alcohol abuse, prior stroke in January 2021 who presents for follow-up.  I saw him in March 2021.  He returns today with his sister.  At his last visit due to elevated alkaline phosphatase and question of primary biliary cholangitis I performed an MRI/MRCP.  MRI did not show evidence of cirrhosis though he had an enlarged caudate lobe.  There was some motion degradation but no changes to suggest PSC.  After MRI given suspicion for PBC we started him on ursodiol 500 mg twice daily.  Patient and sister report that he has been feeling well.  He denies abdominal pain.  Good appetite.  Regular bowel movements.  He is tolerating ursodiol 500 mg twice daily.   Review of Systems As per HPI, otherwise negative  Current Medications, Allergies, Past Medical History, Past Surgical History, Family History and Social History were reviewed in [employer] record.     Objective:   Physical Exam  Gen: awake, alert, NAD HEENT: anicteric CV: RRR, no mrg Pulm: CTA b/l Abd: soft, NT/ND, +BS throughout Ext: no c/c/e Neuro: nonfocal  CBC    Component Value Date/Time   WBC 4.9 02/18/2020 1140   WBC 5.1 11/17/2019 1151   RBC 4.54 02/18/2020 1140   RBC 4.54 11/17/2019 1151   HGB 15.0 02/18/2020 1140   HCT 42.9 02/18/2020 1140   PLT 184 02/18/2020 1140   MCV 95 02/18/2020 1140   MCH 33.0 02/18/2020 1140   MCH 32.2 05/23/2016 1117   MCHC 35.0 02/18/2020 1140   MCHC 34.8 11/17/2019 1151   RDW 12.4 02/18/2020 1140   LYMPHSABS 1.4 02/18/2020 1140   MONOABS 0.5 11/17/2019 1151   EOSABS 0.1 02/18/2020 1140   BASOSABS 0.0 02/18/2020 1140   CMP       Component Value Date/Time   NA 139 02/18/2020 1140   K 5.3 (H) 02/18/2020 1140   CL 100 02/18/2020 1140   CO2 25 02/18/2020 1140   GLUCOSE 91 02/18/2020 1140   GLUCOSE 95 12/22/2019 1521   BUN 15 02/18/2020 1140   CREATININE 1.00 02/18/2020 1140   CREATININE 0.93 04/08/2017 1642   CALCIUM 9.7 02/18/2020 1140   PROT 7.3 02/18/2020 1140   ALBUMIN 4.5 02/18/2020 1140   AST 13 02/18/2020 1140   ALT 10 02/18/2020 1140   ALKPHOS 170 (H) 02/18/2020 1140   BILITOT 0.4 02/18/2020 1140   GFRNONAA 78 02/18/2020 1140   GFRNONAA 86 04/08/2017 1642   GFRAA 90 02/18/2020 1140   GFRAA >89 04/08/2017 1642   Lab Results  Component Value Date   INR 1.1 (H) 12/22/2019   INR 1.08 03/23/2015   INR 0.98 12/25/2013     MRI ABDOMEN WITHOUT AND WITH CONTRAST (INCLUDING MRCP)   TECHNIQUE: Multiplanar multisequence MR imaging of the abdomen was performed both before and after the administration of intravenous contrast. Heavily T2-weighted images of the biliary and pancreatic ducts were obtained, and three-dimensional MRCP images were rendered by post processing.   CONTRAST:  7.8m GADAVIST GADOBUTROL 1 MMOL/ML IV SOLN   COMPARISON:  Abdominal ultrasound dated 11/24/2019. Virtual colonoscopy dated 05/01/2017.   FINDINGS: Motion degraded images.   Lower chest: Lung  bases are essentially clear.   Hepatobiliary: Enlargement of the caudate (series 3/image 13). No definite additional findings to suggest cirrhosis. No suspicious/enhancing hepatic lesions. Scattered small hepatic cysts, measuring up to 8 mm in the right hepatic lobe (series 3/image 7). No hepatic steatosis.   Gallbladder is unremarkable. No intrahepatic or extrahepatic ductal dilatation. Common duct measures 6 mm, within normal limits. No choledocholithiasis is seen.   Pancreas:  Within normal limits.   Spleen:  Within normal limits.   Adrenals/Urinary Tract:  Adrenal glands are within normal limits.   Kidneys are  within normal limits.  No hydronephrosis.   Stomach/Bowel: Stomach is within normal limits.   Visualized bowel is grossly unremarkable.   Vascular/Lymphatic: No evidence of abdominal aortic aneurysm. Portal vein is patent.   No suspicious abdominal lymphadenopathy.   Other:  No abdominal ascites.   Musculoskeletal: No focal osseous lesions.   IMPRESSION: Motion degraded images.   No intrahepatic or extrahepatic ductal dilatation. Specifically, no findings suspicious for primary biliary cholangitis.   Enlargement of the caudate, without definite additional findings to suggest cirrhosis. No suspicious/enhancing hepatic lesions.     Electronically Signed   By: Julian Hy M.D.   On: 01/02/2020 11:21       Assessment & Plan:  69 year old male with a history of colon polyps, elevated alkaline phosphatase with history of antimitochondrial antibody positive, prior viral hepatitis C treated with Harvoni with SVR, history of dementia, history of alcohol abuse, prior stroke in January 2021 who presents for follow-up.  1.  PBC --he meets diagnostic criteria for primary biliary cholangitis by his elevated alk phos and positive antimitochondrial antibody.  We have not performed a biopsy.  Ursodiol was started at weight-based dosing in March 2021.  Clinically he is doing well.  There is no overt evidence for cirrhosis though he did have an enlarged caudate lobe at MRI along with his history of treated to cure hepatitis C and alcoholism in remission. --Continue ursodiol 500 mg twice daily --Check CBC, CMP and INR today --Going to perform upper endoscopy at his surveillance colonoscopy to exclude changes of portal hypertension in the upper GI tract given the enlarged caudate lobe --Will plan to assess alkaline phosphatase ultimately at 1 year of ursodiol therapy to determine if we may consider adding OCA  2.  History of adenomatous and tubulovillous adenomas of the colon --he is due  surveillance colonoscopy in September 2021 for his history of adenomatous polyps.  We will proceed with surveillance colonoscopy.  We discussed the risk, benefits and alternatives and he is agreeable and wishes to proceed.  3.  History of hepatitis C --treated successfully with Harvoni with sustained virologic response  Office follow-up in March 2021 but sooner for colonoscopy and upper endoscopy as discussed above  30 minutes total spent today including patient facing time, coordination of care, reviewing medical history/procedures/pertinent radiology studies, and documentation of the encounter.

## 2020-06-17 ENCOUNTER — Telehealth: Payer: Self-pay | Admitting: Internal Medicine

## 2020-06-17 NOTE — Telephone Encounter (Signed)
See result note.  

## 2020-06-28 ENCOUNTER — Encounter: Payer: Medicare Other | Admitting: Internal Medicine

## 2020-07-01 ENCOUNTER — Other Ambulatory Visit: Payer: Self-pay | Admitting: Internal Medicine

## 2020-07-07 ENCOUNTER — Encounter: Payer: Self-pay | Admitting: Internal Medicine

## 2020-07-07 ENCOUNTER — Ambulatory Visit: Payer: Medicare Other | Attending: Internal Medicine | Admitting: Internal Medicine

## 2020-07-07 ENCOUNTER — Ambulatory Visit (HOSPITAL_BASED_OUTPATIENT_CLINIC_OR_DEPARTMENT_OTHER): Payer: Medicare Other | Admitting: Pharmacist

## 2020-07-07 ENCOUNTER — Other Ambulatory Visit: Payer: Self-pay

## 2020-07-07 VITALS — BP 125/78 | HR 69 | Temp 98.4°F | Resp 16 | Wt 172.4 lb

## 2020-07-07 DIAGNOSIS — R569 Unspecified convulsions: Secondary | ICD-10-CM

## 2020-07-07 DIAGNOSIS — I69354 Hemiplegia and hemiparesis following cerebral infarction affecting left non-dominant side: Secondary | ICD-10-CM | POA: Diagnosis not present

## 2020-07-07 DIAGNOSIS — Z23 Encounter for immunization: Secondary | ICD-10-CM

## 2020-07-07 DIAGNOSIS — I10 Essential (primary) hypertension: Secondary | ICD-10-CM

## 2020-07-07 DIAGNOSIS — Z86010 Personal history of colonic polyps: Secondary | ICD-10-CM

## 2020-07-07 DIAGNOSIS — K743 Primary biliary cirrhosis: Secondary | ICD-10-CM

## 2020-07-07 NOTE — Progress Notes (Signed)
  Patient ID: [name], male    DOB: 06/27/1951  MRN: [id number]  CC: Hypertension   Subjective: [name] is a 69 y.o. male who presents for chronic ds management. Sister, Lesley, is with him. His concerns today include:  Ptwith hxof hepatitis C treated, szdisorder, CVA with residual left-sided weakness uses a rolling walker, HTN, memory deficit (followed by neurology), tubular adenomas.  PBC:  Saw Dr. Prytle last mth for f/u on PBC.  He was continued on the medicine you ursodiol.  Had colonoscopy done 3 years ago and had tubulovillous and adenomatous polyps removed.  He is due for repeat colonoscopy which was scheduled for this month.  However sister is concerned about the cost stating that the last time it was done they got a subsequent bill for $2-$3000.   SZ: No seizures since last visit.  Compliant with Keppra and Dilantin.  He denies any headaches or dizziness.  He has not had any falls.    HYPERTENSION Currently taking: see medication list Med Adherence: [x] Yes on metoprolol    [] No Medication side effects: [] Yes    [x] No Adherence with salt restriction: [x] Yes    [] No Home Monitoring?: [] Yes    [x] No Monitoring Frequency: [] Yes    [] No Home BP results range: [] Yes    [] No SOB? [] Yes    [x] No Chest Pain?: [] Yes    [x] No Leg swelling?: [] Yes    [x] No Headaches?: [] Yes    [x] No Dizziness? [] Yes    [x] No Comments:  Not much exercise. Sister plans to sign up for the [employer]  CVA: He has not had any falls.  He ambulates with a Rollator walker.  Still has weakness on the left side compared to the right from previous stroke.  Compliant with taking aspirin and Zocor.  Patient Active Problem List   Diagnosis Date Noted  . Elevated alkaline phosphatase level 11/17/2019  . Adenomatous polyp of colon 09/02/2017  . Functional urinary incontinence 09/02/2017  . Hemiparesis and alteration of sensations as late effects of stroke (HCC) 01/25/2017  . Liver  fibrosis 09/23/2015  . HTN (hypertension) 06/20/2015  . 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  . Hip fracture (HCC) 11/26/2013  . Seizure (HCC) 11/26/2013  . Memory disorder 03/04/2013  . Depressive disorder, not elsewhere classified 07/24/2012  . Abnormality of gait 07/24/2012  . Focal epilepsy with impairment of consciousness (HCC) 07/24/2012  . Cerebral thrombosis with cerebral infarction (HCC) 07/24/2012  . Lesion of ulnar nerve 07/24/2012     Current Outpatient Medications on File Prior to Visit  Medication Sig Dispense Refill  . aspirin EC 81 MG tablet Take 1 tablet (81 mg total) by mouth daily. 100 tablet 0  . levETIRAcetam (KEPPRA) 750 MG tablet Take 2 tablets (1,500 mg total) by mouth 2 (two) times daily. 360 tablet 3  . memantine (NAMENDA) 10 MG tablet TAKE 1 TABLET BY MOUTH TWICE A DAY 180 tablet 0  . metoprolol succinate (TOPROL-XL) 25 MG 24 hr tablet Take 1 tablet (25 mg total) by mouth daily. 90 tablet 2  . phenytoin (DILANTIN) 100 MG ER capsule Take 2 capsules (200 mg total) by mouth at bedtime. 180 capsule 1  . phenytoin (PHENYTOIN INFATABS) 50 MG tablet Chew 0.5 tablets (25 mg total) by mouth at bedtime. 45 tablet 1  . simvastatin (ZOCOR) 20 MG tablet Take 1 tablet (20 mg   total) by mouth daily at 6 PM. 90 tablet 2  . Sodium Sulfate-Mag Sulfate-KCl (SUTAB) 1479-225-188 MG TABS Take 1 kit by mouth as directed. BIN: [id number] PCN: CN GROUP: [id number] MEMBER ID: [id number]; RUN AS CASH 24 tablet 0  . URSODIOL PO Take 500 mg by mouth in the morning and at bedtime.     No current facility-administered medications on file prior to visit.    No Known Allergies  Social History   Socioeconomic History  . Marital status: Divorced    Spouse name: Not on file  . Number of children: 0  . Years of education: 10  . Highest education level: Not on file  Occupational History  . Not on file  Tobacco Use  . Smoking status: Former Smoker    Packs/day: 3.00    Quit date:  01/02/2014    Years since quitting: 6.5  . Smokeless tobacco: Former User  Substance and Sexual Activity  . Alcohol use: No    Alcohol/week: 0.0 standard drinks    Comment: Former Alcoholic  . Drug use: No  . Sexual activity: Yes  Other Topics Concern  . Not on file  Social History Narrative   Patient is single and lives with a friend and her son.   Patient does not have any children.   Patient is disabled.   Patient has a 10 th grade education.   Patient is left-handed.   Patient drinks some soda but not everyday.   Social Determinants of Health   Financial Resource Strain:   . Difficulty of Paying Living Expenses: Not on file  Food Insecurity:   . Worried About Running Out of Food in the Last Year: Not on file  . Ran Out of Food in the Last Year: Not on file  Transportation Needs:   . Lack of Transportation (Medical): Not on file  . Lack of Transportation (Non-Medical): Not on file  Physical Activity:   . Days of Exercise per Week: Not on file  . Minutes of Exercise per Session: Not on file  Stress:   . Feeling of Stress : Not on file  Social Connections:   . Frequency of Communication with Friends and Family: Not on file  . Frequency of Social Gatherings with Friends and Family: Not on file  . Attends Religious Services: Not on file  . Active Member of Clubs or Organizations: Not on file  . Attends Club or Organization Meetings: Not on file  . Marital Status: Not on file  Intimate Partner Violence:   . Fear of Current or Ex-Partner: Not on file  . Emotionally Abused: Not on file  . Physically Abused: Not on file  . Sexually Abused: Not on file    Family History  Problem Relation Age of Onset  . Pulmonary embolism Mother   . Liver disease Father   . Diabetes Sister   . Hypertension Sister   . Hypertension Sister   . Hypertension Sister   . Seizures Neg Hx     Past Surgical History:  Procedure Laterality Date  . COLONOSCOPY WITH PROPOFOL N/A 07/10/2017    Procedure: COLONOSCOPY WITH PROPOFOL;  Surgeon: Brahmbhatt, Parag, MD;  Location: [facility];  Service: Gastroenterology;  Laterality: N/A;  . ESOPHAGOGASTRODUODENOSCOPY (EGD) WITH PROPOFOL N/A 07/10/2017   Procedure: ESOPHAGOGASTRODUODENOSCOPY (EGD) WITH PROPOFOL;  Surgeon: Brahmbhatt, Parag, MD;  Location: [facility];  Service: Gastroenterology;  Laterality: N/A;  . HIP ARTHROPLASTY Left 11/26/2013   Procedure: LEFT HIP HEMIARTHROPLASTY;  Surgeon: Christopher Y Blackman,   MD;  Location: [facility] OR;  Service: Orthopedics;  Laterality: Left;  . WRIST SURGERY Left 95 & 96    ROS: Review of Systems Negative except as stated above  PHYSICAL EXAM: BP 125/78   Pulse 69   Temp 98.4 F (36.9 C)   Resp 16   Wt 172 lb 6.4 oz (78.2 kg)   SpO2 95%   BMI 25.46 kg/m   Physical Exam   General appearance -patient sitting on exam table in no acute distress.   Mental status -patient is soft-spoken.  He answers questions appropriately. Neck - supple, no significant adenopathy Chest - clear to auscultation, no wheezes, rales or rhonchi, symmetric air entry Heart - normal rate, regular rhythm, normal S1, S2, no murmurs, rubs, clicks or gallops Neurological -grip 5/5 on the right, 4/5 on the left.  Power in the upper extremity is 5/5 proximally and distally in the right upper extremity and 4+/5 distally and 5/5 proximally in the left upper extremity.  Power in the lower extremity 5/5 on the right and 4+/5 on the left.  He walks with a little bit of a hopping gait hopping on the right leg. Extremities -no lower extremity edema CMP Latest Ref Rng & Units 06/15/2020 06/15/2020 02/18/2020  Glucose 70 - 99 mg/dL - 98 91  BUN 6 - 23 mg/dL - 11 15  Creatinine 0.40 - 1.50 mg/dL - 0.96 1.00  Sodium 135 - 145 mEq/L - 139 139  Potassium 3.5 - 5.1 mEq/L - 4.9 5.3(H)  Chloride 96 - 112 mEq/L - 102 100  CO2 19 - 32 mEq/L - 30 25  Calcium 8.4 - 10.5 mg/dL - 10.0 9.7  Total Protein 6.0 - 8.3 g/dL 7.8 7.8 7.3  Total  Bilirubin 0.2 - 1.2 mg/dL 0.5 0.5 0.4  Alkaline Phos 39 - 117 U/L 135(H) 135(H) 170(H)  AST 0 - 37 U/L 11 11 13  ALT 0 - 53 U/L 7 7 10   Lipid Panel     Component Value Date/Time   CHOL 126 09/11/2019 1218   TRIG 113 09/11/2019 1218   HDL 30 (L) 09/11/2019 1218   CHOLHDL 4.2 09/11/2019 1218   CHOLHDL 2.5 05/29/2016 0943   VLDL 16 05/29/2016 0943   LDLCALC 75 09/11/2019 1218    CBC    Component Value Date/Time   WBC 5.4 06/15/2020 1408   RBC 4.52 06/15/2020 1408   HGB 15.0 06/15/2020 1408   HGB 15.0 02/18/2020 1140   HCT 43.5 06/15/2020 1408   HCT 42.9 02/18/2020 1140   PLT 175.0 06/15/2020 1408   PLT 184 02/18/2020 1140   MCV 96.3 06/15/2020 1408   MCV 95 02/18/2020 1140   MCH 33.0 02/18/2020 1140   MCH 32.2 05/23/2016 1117   MCHC 34.6 06/15/2020 1408   RDW 12.6 06/15/2020 1408   RDW 12.4 02/18/2020 1140   LYMPHSABS 1.4 02/18/2020 1140   MONOABS 0.5 11/17/2019 1151   EOSABS 0.1 02/18/2020 1140   BASOSABS 0.0 02/18/2020 1140    ASSESSMENT AND PLAN:  1. Essential hypertension At goal.  Continue metoprolol and low-salt diet.  2. Hemiparesis of left nondominant side as late effect of cerebral infarction (HCC) Stable.  Continue aspirin, Zocor, good blood pressure control.  3. Seizure (HCC) Controlled on Dilantin and Keppra.  4. History of colon polyps Advised sister to call the patient's insurance and inquire what his co-pay would be for the colonoscopy.  5. Primary biliary cholangitis (HCC) Followed by Dr. Pyrtle  6. Need for   influenza vaccination Given today.    Patient was given the opportunity to ask questions.  Patient verbalized understanding of the plan and was able to repeat key elements of the plan.   No orders of the defined types were placed in this encounter.    Requested Prescriptions    No prescriptions requested or ordered in this encounter    Return in about 6 months (around 01/04/2021).  Karle Plumber, MD, FACP

## 2020-07-07 NOTE — Patient Instructions (Addendum)
You should call your insurance and let them know that you are due for another colonoscopy and that she had a history of colon polyps that were removed 3 years ago.  Inquire about your co-pay so that she would know your cost upfront.   Influenza Virus Vaccine injection (Fluarix) What is this medicine? INFLUENZA VIRUS VACCINE (in floo EN zuh VAHY ruhs vak SEEN) helps to reduce the risk of getting influenza also known as the flu. This medicine may be used for other purposes; ask your health care provider or pharmacist if you have questions. COMMON BRAND NAME(S): Fluarix, Fluzone What should I tell my health care provider before I take this medicine? They need to know if you have any of these conditions:  bleeding disorder like hemophilia  fever or infection  Guillain-Barre syndrome or other neurological problems  immune system problems  infection with the human immunodeficiency virus (HIV) or AIDS  low blood platelet counts  multiple sclerosis  an unusual or allergic reaction to influenza virus vaccine, eggs, chicken proteins, latex, gentamicin, other medicines, foods, dyes or preservatives  pregnant or trying to get pregnant  breast-feeding How should I use this medicine? This vaccine is for injection into a muscle. It is given by a health care professional. A copy of Vaccine Information Statements will be given before each vaccination. Read this sheet carefully each time. The sheet may change frequently. Talk to your pediatrician regarding the use of this medicine in children. Special care may be needed. Overdosage: If you think you have taken too much of this medicine contact a poison control center or emergency room at once. NOTE: This medicine is only for you. Do not share this medicine with others. What if I miss a dose? This does not apply. What may interact with this medicine?  chemotherapy or radiation therapy  medicines that lower your immune system like etanercept,  anakinra, infliximab, and adalimumab  medicines that treat or prevent blood clots like warfarin  phenytoin  steroid medicines like prednisone or cortisone  theophylline  vaccines This list may not describe all possible interactions. Give your health care provider a list of all the medicines, herbs, non-prescription drugs, or dietary supplements you use. Also tell them if you smoke, drink alcohol, or use illegal drugs. Some items may interact with your medicine. What should I watch for while using this medicine? Report any side effects that do not go away within 3 days to your doctor or health care professional. Call your health care provider if any unusual symptoms occur within 6 weeks of receiving this vaccine. You may still catch the flu, but the illness is not usually as bad. You cannot get the flu from the vaccine. The vaccine will not protect against colds or other illnesses that may cause fever. The vaccine is needed every year. What side effects may I notice from receiving this medicine? Side effects that you should report to your doctor or health care professional as soon as possible:  allergic reactions like skin rash, itching or hives, swelling of the face, lips, or tongue Side effects that usually do not require medical attention (report to your doctor or health care professional if they continue or are bothersome):  fever  headache  muscle aches and pains  pain, tenderness, redness, or swelling at site where injected  weak or tired This list may not describe all possible side effects. Call your doctor for medical advice about side effects. You may report side effects to FDA at 1-800-FDA-1088.  Where should I keep my medicine? This vaccine is only given in a clinic, pharmacy, doctor's office, or other health care setting and will not be stored at home. NOTE: This sheet is a summary. It may not cover all possible information. If you have questions about this medicine, talk  to your doctor, pharmacist, or health care provider.  2020 Elsevier/Gold Standard (2008-05-05 09:30:40)

## 2020-07-07 NOTE — Progress Notes (Signed)
Patient presents for vaccination against influenza per orders of Dr. Johnson. Consent given. Counseling provided. No contraindications exists. Vaccine administered without incident.  ° °Luke Van Ausdall, PharmD, CPP °Clinical Pharmacist °[facility] °[phone] ° °

## 2020-07-13 ENCOUNTER — Other Ambulatory Visit: Payer: Self-pay | Admitting: Internal Medicine

## 2020-07-25 ENCOUNTER — Encounter (HOSPITAL_COMMUNITY): Payer: Self-pay | Admitting: Emergency Medicine

## 2020-07-25 ENCOUNTER — Emergency Department (HOSPITAL_COMMUNITY): Payer: Medicare Other

## 2020-07-25 ENCOUNTER — Emergency Department (HOSPITAL_COMMUNITY)
Admission: EM | Admit: 2020-07-25 | Discharge: 2020-07-26 | Disposition: A | Discharge status: Home / Self Care | Payer: Medicare Other | Attending: Emergency Medicine | Admitting: Emergency Medicine

## 2020-07-25 ENCOUNTER — Other Ambulatory Visit: Payer: Self-pay

## 2020-07-25 DIAGNOSIS — Z87891 Personal history of nicotine dependence: Secondary | ICD-10-CM | POA: Diagnosis not present

## 2020-07-25 DIAGNOSIS — Z7982 Long term (current) use of aspirin: Secondary | ICD-10-CM | POA: Insufficient documentation

## 2020-07-25 DIAGNOSIS — R29810 Facial weakness: Secondary | ICD-10-CM | POA: Diagnosis present

## 2020-07-25 DIAGNOSIS — I69354 Hemiplegia and hemiparesis following cerebral infarction affecting left non-dominant side: Secondary | ICD-10-CM | POA: Insufficient documentation

## 2020-07-25 DIAGNOSIS — B1920 Unspecified viral hepatitis C without hepatic coma: Secondary | ICD-10-CM | POA: Diagnosis not present

## 2020-07-25 DIAGNOSIS — Z79899 Other long term (current) drug therapy: Secondary | ICD-10-CM | POA: Diagnosis not present

## 2020-07-25 DIAGNOSIS — G510 Bell's palsy: Secondary | ICD-10-CM | POA: Insufficient documentation

## 2020-07-25 DIAGNOSIS — K5790 Diverticulosis of intestine, part unspecified, without perforation or abscess without bleeding: Secondary | ICD-10-CM | POA: Diagnosis not present

## 2020-07-25 DIAGNOSIS — I10 Essential (primary) hypertension: Secondary | ICD-10-CM | POA: Diagnosis not present

## 2020-07-25 DIAGNOSIS — F0390 Unspecified dementia without behavioral disturbance: Secondary | ICD-10-CM | POA: Diagnosis not present

## 2020-07-25 LAB — CBC
HCT: 41.9 % (ref 39.0–52.0)
Hemoglobin: 15 g/dL (ref 13.0–17.0)
MCH: 33.3 pg (ref 26.0–34.0)
MCHC: 35.8 g/dL (ref 30.0–36.0)
MCV: 92.9 fL (ref 80.0–100.0)
Platelets: 179 10*3/uL (ref 150–400)
RBC: 4.51 MIL/uL (ref 4.22–5.81)
RDW: 11.9 % (ref 11.5–15.5)
WBC: 5.6 10*3/uL (ref 4.0–10.5)
nRBC: 0 % (ref 0.0–0.2)

## 2020-07-25 LAB — COMPREHENSIVE METABOLIC PANEL
ALT: 9 U/L (ref 0–44)
AST: 11 U/L — ABNORMAL LOW (ref 15–41)
Albumin: 3.9 g/dL (ref 3.5–5.0)
Alkaline Phosphatase: 137 U/L — ABNORMAL HIGH (ref 38–126)
Anion gap: 9 (ref 5–15)
BUN: 11 mg/dL (ref 8–23)
CO2: 26 mmol/L (ref 22–32)
Calcium: 9.3 mg/dL (ref 8.9–10.3)
Chloride: 102 mmol/L (ref 98–111)
Creatinine, Ser: 0.97 mg/dL (ref 0.61–1.24)
GFR calc Af Amer: >60 mL/min (ref >60)
GFR calc non Af Amer: >60 mL/min (ref >60)
Glucose, Bld: 106 mg/dL — ABNORMAL HIGH (ref 70–99)
Potassium: 4.4 mmol/L (ref 3.5–5.1)
Sodium: 137 mmol/L (ref 135–145)
Total Bilirubin: 0.6 mg/dL (ref 0.3–1.2)
Total Protein: 7.4 g/dL (ref 6.5–8.1)

## 2020-07-25 LAB — DIFFERENTIAL
Abs Immature Granulocytes: 0.02 10*3/uL (ref 0.00–0.07)
Basophils Absolute: 0 10*3/uL (ref 0.0–0.1)
Basophils Relative: 1 %
Eosinophils Absolute: 0.2 10*3/uL (ref 0.0–0.5)
Eosinophils Relative: 3 %
Immature Granulocytes: 0 %
Lymphocytes Relative: 32 %
Lymphs Abs: 1.8 10*3/uL (ref 0.7–4.0)
Monocytes Absolute: 0.7 10*3/uL (ref 0.1–1.0)
Monocytes Relative: 13 %
Neutro Abs: 2.8 10*3/uL (ref 1.7–7.7)
Neutrophils Relative %: 51 %

## 2020-07-25 LAB — I-STAT CHEM 8, ED
BUN: 12 mg/dL (ref 8–23)
Calcium, Ion: 1.21 mmol/L (ref 1.15–1.40)
Chloride: 101 mmol/L (ref 98–111)
Creatinine, Ser: 0.9 mg/dL (ref 0.61–1.24)
Glucose, Bld: 101 mg/dL — ABNORMAL HIGH (ref 70–99)
HCT: 43 % (ref 39.0–52.0)
Hemoglobin: 14.6 g/dL (ref 13.0–17.0)
Potassium: 4.1 mmol/L (ref 3.5–5.1)
Sodium: 139 mmol/L (ref 135–145)
TCO2: 28 mmol/L (ref 22–32)

## 2020-07-25 LAB — PROTIME-INR
INR: 1.1 (ref 0.8–1.2)
Prothrombin Time: 13.4 seconds (ref 11.4–15.2)

## 2020-07-25 LAB — APTT: aPTT: 24 seconds (ref 24–36)

## 2020-07-25 LAB — CBG MONITORING, ED: Glucose-Capillary: 96 mg/dL (ref 70–99)

## 2020-07-25 MED ORDER — SODIUM CHLORIDE 0.9% FLUSH
3.0000 mL | Freq: Once | INTRAVENOUS | Status: AC
  Administered 2020-07-26: 3 mL via INTRAVENOUS

## 2020-07-25 NOTE — ED Triage Notes (Signed)
Pt noted to have L sided facial droop and L eye paralysis onset yesterday, sister unsure of exact time, she noticed droop @ 1700-1800 yesterday.  Pt alert,  unsure of why he is at the Hospital, L sided arm weakness, unsteady gait.

## 2020-07-25 NOTE — ED Triage Notes (Signed)
Emergency Medicine Provider Triage Evaluation Note  [name] , a 69 y.o. male  was evaluated in triage.  Pt complains of facial droop that started some time last evening. Pt is a bad historian accompanied by wife who brought him to the ER for evaluation of stroke.  Patient is not currently complaining of any pain/ numbess/ tingling. No weakness or dizziness or HA.   Patient is also having impaired movement of his entire left face including his forehead and left eye.  Review of Systems  Positive: Facial droop Negative: Headache, vision changes, numbness, tingling, weakness  Physical Exam  BP 137/82   Pulse 78   Temp 98.6 F (37 C)   Resp 15   SpO2 96%  Gen:   Awake, no distress  HEENT:  Atraumatic  Resp:  Normal effort  Cardiac:  Normal rate  Abd:   Nondistended, nontender MSK:   Moves extremities without difficulty, normal strength. Neuro:  Patient with facial droop on left side, unable to smile or puff out cheeks due to left side due to weakness.  Unable to raise eyebrow on left side.  Unable to fully close eye on left side.  Normal strength and sensation.  Negative Romberg's.  Negative pronator drift.  Medical Decision Making  Medically screening exam initiated at 9:32 PM.  Appropriate orders placed.  [name] was informed that the remainder of the evaluation will be completed by another provider, this initial triage assessment does not replace that evaluation, and the importance of remaining in the ED until their evaluation is complete.  Clinical Impression  Patient is a 69 year old presenting to the emergency department for facial droop.  Do not think we need to initiate code stroke at this time.  Presentation more similar to Bell's palsy.  Will order labs at this time. Discussed case with Dr.Pickering who agrees. The remainder of his evaluation will be completed by another provider.   Joshua Client, PA-C 07/25/20 2206

## 2020-07-25 NOTE — ED Provider Notes (Signed)
[facility] EMERGENCY DEPARTMENT Provider Note   CSN: [id number] Arrival date & time: 07/25/20  2047     History Chief Complaint  Patient presents with  . Facial Droop    [name] is a 69 y.o. 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  Patient was initially brought to the ER by his sister for stroke evaluation after he developed facial droop more than 24 hours ago.  He is also not able to close his left eye or raise his left forehead.   Per chart review, patient has a history of CVA with left hemiparesis.  He is followed by neurology and was last seen in April 2021.  In the ER, patient has no complaints and is unsure why he is in the ER.  No family at bedside at this time.  Level 5 caveat secondary to dementia.  The history is provided by the patient and medical records. No language interpreter was used.       Past Medical History:  Diagnosis Date  . Alcoholism ([place])    History of, not active  . Chronic hepatitis C ([place])   . Dementia ([place])   . Diverticulosis   . Gait disorder   . 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 Hemiparesis and alteration of sensations as late effects of stroke ([place]) 01/25/2017   Left hemiparesis  . Hiatal hernia   . History of alcoholism ([place])   . Hx of ischemic vertebrobasilar artery thalamic stroke    Right  . Internal hemorrhoids   . Memory disorder 03/04/2013  . Seizures ([place])   . Stroke [facility])    Right frontal, right thalamic  . Tubular adenoma of colon   . Ulnar neuropathy of left upper extremity     Patient Active Problem List   Diagnosis Date Noted  . Elevated alkaline phosphatase level 11/17/2019  . Adenomatous polyp of colon 09/02/2017  . Functional urinary incontinence 09/02/2017  . Hemiparesis and alteration of sensations as late  effects of stroke ([place]) 01/25/2017  . 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/23/2015  . HTN (hypertension) 06/20/2015  . 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  . Hip fracture ([place]) 11/26/2013  . Seizure ([place]) 11/26/2013  . Memory disorder 03/04/2013  . Depressive disorder, not elsewhere classified 07/24/2012  . Abnormality of gait 07/24/2012  . Focal epilepsy with impairment of consciousness ([place]) 07/24/2012  . Cerebral thrombosis with cerebral infarction ([place]) 07/24/2012  . Lesion of ulnar nerve 07/24/2012    Past Surgical History:  Procedure Laterality Date  . COLONOSCOPY WITH PROPOFOL N/A 07/10/2017   Procedure: COLONOSCOPY WITH PROPOFOL;  Surgeon: Otis Brace, MD;  Location: [place];  Service: Gastroenterology;  Laterality: N/A;  . ESOPHAGOGASTRODUODENOSCOPY (EGD) WITH PROPOFOL N/A 07/10/2017   Procedure: ESOPHAGOGASTRODUODENOSCOPY (EGD) WITH PROPOFOL;  Surgeon: Otis Brace, MD;  Location: [facility];  Service: Gastroenterology;  Laterality: N/A;  . HIP ARTHROPLASTY Left 11/26/2013   Procedure: LEFT HIP HEMIARTHROPLASTY;  Surgeon: Mcarthur Rossetti, MD;  Location: [place];  Service: Orthopedics;  Laterality: Left;  . WRIST SURGERY Left 95 & 96       Family History  Problem Relation Age of Onset  . Pulmonary embolism Mother   . Liver disease Father   . Diabetes Sister   . Hypertension Sister   . Hypertension Sister   . Hypertension Sister   .  Seizures Neg Hx     Social History   Tobacco Use  . Smoking status: Former Smoker    Packs/day: 3.00    Quit date: 01/02/2014    Years since quitting: 6.5  . Smokeless tobacco: Former Network engineer Use Topics  . Alcohol use: No    Alcohol/week: 0.0 standard drinks    Comment: Former Alcoholic  . Drug use: No    Home Medications Prior to Admission medications   Medication Sig Start Date End Date Taking? Authorizing Provider  aspirin EC 81 MG tablet Take 1 tablet (81 mg total) by mouth daily. 03/04/20   Ladell Pier, MD  levETIRAcetam (KEPPRA) 750 MG tablet Take 2 tablets (1,500 mg total) by mouth 2 (two) times daily. 02/18/20   Suzzanne Cloud, NP  memantine (NAMENDA) 10 MG tablet TAKE 1 TABLET BY MOUTH TWICE A DAY 07/01/20   Ladell Pier, MD  metoprolol succinate (TOPROL-XL) 25 MG 24 hr tablet Take 1 tablet (25 mg total) by mouth daily. 10/20/19   Ladell Pier, MD  phenytoin (DILANTIN) 100 MG ER capsule Take 2 capsules (200 mg total) by mouth at bedtime. 02/18/20   Suzzanne Cloud, NP  phenytoin (PHENYTOIN INFATABS) 50 MG tablet Chew 0.5 tablets (25 mg total) by mouth at bedtime. 02/18/20   Suzzanne Cloud, NP  predniSONE (DELTASONE) 10 MG tablet Take 6 tablets (60 mg total) by mouth daily for 6 days. 07/26/20 08/01/20  Neelam Tiggs A, PA-C  simvastatin (ZOCOR) 20 MG tablet Take 1 tablet (20 mg total) by mouth daily at 6 PM. 09/11/19   Ladell Pier, MD  Sodium Sulfate-Mag Sulfate-KCl (SUTAB) [phone] MG TABS Take 1 kit by mouth as directed. BIN: [id number] PCN: CN GROUP: [id number] MEMBER ID: [id number]; RUN AS CASH 06/15/20   Pyrtle, Lajuan Lines, MD  ursodiol (ACTIGALL) 250 MG tablet TAKE 2 TABLETS (500 MG TOTAL) BY MOUTH IN THE MORNING AND AT BEDTIME. 07/13/20 08/12/20  Pyrtle, Lajuan Lines, MD  URSODIOL PO Take 500 mg by mouth in the morning and at bedtime.    [provider]  valACYclovir (VALTREX) 1000 MG tablet Take 1 tablet (1,000 mg total) by mouth 3 (three) times daily for 7 days. 07/26/20 08/02/20  Arantxa Piercey A, PA-C    Allergies    Patient has no known allergies.  Review of Systems   Review of Systems  Unable to perform ROS: Dementia    Physical Exam Updated Vital Signs BP (!) 142/97   Pulse 64   Temp 98.6 F (37 C)   Resp 17   SpO2 94%   Physical Exam Vitals and nursing note reviewed.  Constitutional:      Appearance: He is well-developed. He is not ill-appearing or diaphoretic.     Comments: Pleasant, well appearing. NAD.  HENT:     Head: Normocephalic.     Nose:  Nose normal.     Mouth/Throat:     Mouth: Mucous membranes are moist.  Eyes:     General: No scleral icterus.       Right eye: No discharge.        Left eye: No discharge.     Extraocular Movements: Extraocular movements intact.     Conjunctiva/sclera: Conjunctivae normal.     Pupils: Pupils are equal, round, and reactive to light.  Cardiovascular:     Rate and Rhythm: Normal rate and regular rhythm.     Pulses: Normal pulses.     Heart sounds:  Normal heart sounds. No murmur heard.  No friction rub. No gallop.   Pulmonary:     Effort: Pulmonary effort is normal. No respiratory distress.     Breath sounds: No stridor. No wheezing, rhonchi or rales.  Chest:     Chest wall: No tenderness.  Abdominal:     General: There is no distension.     Palpations: Abdomen is soft. There is no mass.     Tenderness: There is no abdominal tenderness. There is no right CVA tenderness, left CVA tenderness, guarding or rebound.     Hernia: No hernia is present.  Musculoskeletal:     Cervical back: Neck supple.  Skin:    General: Skin is warm and dry.     Coloration: Skin is not jaundiced or pale.  Neurological:     Mental Status: He is alert.     Comments: Alert.  Patient is oriented to self.  Believes the year to be 1997.  He is unsure of the president.  He knows that he is at a hospital in [facility].  He is oriented to both his first and last name and his birthday.  Ptosis on the left.  He is unable to wrinkle his forehead on the left.  He has a left-sided asymmetric smile.  Decreased shoulder shrug on the left.  Otherwise, cranial nerves II through XII are grossly intact  Speaks in complete, fluent sentences without slurred speech.  Finger-to-nose is not smooth bilaterally.  No true dysmetria.  Unable to perform heel-to-shin.  3 out of 5 strength against resistance of the bilateral upper and lower extremity on the left compared to 5 out of 5 on the right.  Sensation is  subjectively intact and symmetric to the bilateral upper and lower extremities.  Psychiatric:        Behavior: Behavior normal.     ED Results / Procedures / Treatments   Labs (all labs ordered are listed, but only abnormal results are displayed) Labs Reviewed  COMPREHENSIVE METABOLIC PANEL - Abnormal; Notable for the following components:      Result Value   Glucose, Bld 106 (*)    AST 11 (*)    Alkaline Phosphatase 137 (*)    All other components within normal limits  I-STAT CHEM 8, ED - Abnormal; Notable for the following components:   Glucose, Bld 101 (*)    All other components within normal limits  PROTIME-INR  APTT  CBC  DIFFERENTIAL  CBG MONITORING, ED  I-STAT CHEM 8, ED  CBG MONITORING, ED    EKG EKG Interpretation  Date/Time:  Monday July 25 2020 21:39:56 EDT Ventricular Rate:  91 PR Interval:  170 QRS Duration: 80 QT Interval:  354 QTC Calculation: 435 R Axis:   24 Text Interpretation: Normal sinus rhythm Normal ECG No significant change since last tracing Confirmed by Deno Etienne [phone]) on 07/26/2020 12:14:03 AM   Radiology CT HEAD WO CONTRAST  Result Date: 07/25/2020 CLINICAL DATA:  Neuro deficit, acute, stroke suspected facial droop more suggestive of Bells Palsy EXAM: CT HEAD WITHOUT CONTRAST TECHNIQUE: Contiguous axial images were obtained from the base of the skull through the vertex without intravenous contrast. COMPARISON:  Head CT 01/01/2015 FINDINGS: Brain: Again seen generalized atrophy. There is stable ventricular prominence from prior exam, with ex vacuo dilatation of the occipital horn of the right lateral ventricle. Chronic right parietal infarct with encephalomalacia. Chronic periventricular small vessel disease. No hemorrhage. No evidence of acute ischemia. No subdural or  extra-axial collection. Vascular: No hyperdense vessel. Skull: No fracture or focal lesion. Sinuses/Orbits: Near complete opacification of left mastoid air cells. Chronic  opacification of right mastoid air cells which are hypoplastic. No acute orbital abnormality. Remote nasal bone fracture. Other: None. IMPRESSION: 1. No acute intracranial abnormality. 2. Stable atrophy, ventriculomegaly, and chronic small vessel ischemia. Chronic right parietal infarct with encephalomalacia. 3. Near complete opacification of left mastoid air cells. Chronic opacification of right mastoid air cells which are hypoplastic. Electronically Signed   By: Keith Rake M.D.   On: 07/25/2020 22:29   MR BRAIN WO CONTRAST  Result Date: 07/26/2020 CLINICAL DATA:  Initial evaluation for neuro deficit, stroke suspected, left-sided facial droop. EXAM: MRI HEAD WITHOUT CONTRAST TECHNIQUE: Multiplanar, multiecho pulse sequences of the brain and surrounding structures were obtained without intravenous contrast. COMPARISON:  Prior head CT from 07/25/2020. FINDINGS: Brain: Moderately advanced cerebral and cerebellar atrophy. Encephalomalacia and gliosis at the right temporoparietal region most likely related to prior infarct. Associated chronic hemosiderin staining present within this region. No abnormal foci of restricted diffusion to suggest acute or subacute ischemia. Gray-white matter differentiation otherwise maintained. No other areas of chronic cortical infarction. No other evidence for acute or chronic intracranial hemorrhage. No mass lesion, midline shift, or mass effect. Diffuse ventriculomegaly with ex vacuo dilatation of the right lateral ventricle, similar to previous exams. No evidence for transependymal flow of CSF. No extra-axial fluid collection. Pituitary gland and suprasellar region within normal limits. Midline structures intact. Vascular: Major intracranial vascular flow voids are maintained. Skull and upper cervical spine: Craniocervical junction within normal limits. Bone marrow signal intensity normal. No scalp soft tissue abnormality. Sinuses/Orbits: Globes and orbital soft tissues  demonstrate no acute finding. Mild scattered mucosal thickening noted within the ethmoidal air cells and maxillary sinuses. Chronic bilateral mastoid effusions noted. Other: None. IMPRESSION: 1. No acute intracranial abnormality. 2. Chronic right temporoparietal infarct with associated encephalomalacia and gliosis. 3. Moderately advanced cerebral and cerebellar atrophy. Associated ventriculomegaly with ex vacuo dilatation of the right lateral ventricle, similar to previous exams. Electronically Signed   By: Jeannine Boga M.D.   On: 07/26/2020 01:51    Procedures Procedures (including critical care time)  Medications Ordered in ED Medications  valACYclovir (VALTREX) tablet 1,000 mg (has no administration in time range)  predniSONE (DELTASONE) tablet 60 mg (has no administration in time range)  sodium chloride flush (NS) 0.9 % injection 3 mL (3 mLs Intravenous Given 07/26/20 0012)    ED Course  I have reviewed the triage vital signs and the nursing notes.  Pertinent labs & imaging results that were available during my care of the patient were reviewed by me and considered in my medical decision making (see chart for details).  Clinical Course as of Jul 26 250  Mon Jul 25, 2020  2300 Spoke with patient's sister and primary caregiver, [name], to clarify patient's last known normal.  She reports that when he awoke on the morning of 10/4 (today) he was at his baseline.  The patient took multiple naps throughout the day.  Following one of the naps, he developed left-sided facial drooping.  However, she is unsure of the exact time or which specific part of the day.  However, symptoms began less than 24 hours ago.   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  Uncertain last known normal as patient has a history of dementia and is only alert and oriented to self and place, but not time.  EMS reported that symptoms began more than 24 hours ago.  Initially was unable to contact family for collateral information, but later spoke with [name], patient's sister, who could confirm that symptoms began less than 24 hours ago.  However, she was unable to provide any additional specific information other than it was when he awoke from one of his many naps earlier today.  Per chart review, patient was seen by neurology on 02/18/2020 and physical exam documented normal, bilateral shoulder shrug.  Today, he is unable to perform a shoulder shrug on the left.  He does have left-sided weakness, but has a history of known left hemiparesis from a previous CVA.  There is also left-sided ptosis, asymmetric smile, and he is unable to wrinkle his left forehead.  The patient was seen and independently evaluated by Dr. Sedonia Small, attending physician.  Labs are reassuring.  Alkaline phosphatase is elevated, but this is chronic and stable from previous.  Vital signs are reassuring.  Given that deficits on his neurologic exam cannot be isolated to the facial nerve and patient is not a reliable historian given his history of dementia to ensure there is no worsening left hemiparesis, MR brain will be obtained.  MR brain is negative for acute findings.  Discussed results with Dr. Mike Craze, neurology, who recommended initiating valacyclovir and prednisone giving onset of Bell's palsy within 24 hours.  First dose given in the ER.  Family was also given detailed instructions for eye care to prevent corneal injury since he is unable to fully close his left eye.  Per chart review, he has a follow-up appoint with neurology on November 2, which she has been encouraged to keep.  All questions answered.  He is  hemodynamically stable and in no acute distress.  ER return precautions given.  Safe for discharge to home with outpatient follow-up.   Final Clinical Impression(s) / ED Diagnoses Final diagnoses:  Bell's palsy    Rx / DC Orders ED Discharge Orders         Ordered    predniSONE (DELTASONE) 10 MG tablet  Daily        07/26/20 0218    valACYclovir (VALTREX) 1000 MG tablet  3 times daily,   Status:  Discontinued        07/26/20 0218    valACYclovir (VALTREX) 1000 MG tablet  3 times daily        07/26/20 0222           Avereigh Spainhower A, PA-C 07/26/20 0251    Maudie Flakes, MD 07/28/20 2256

## 2020-07-26 ENCOUNTER — Other Ambulatory Visit: Payer: Self-pay

## 2020-07-26 ENCOUNTER — Emergency Department (HOSPITAL_COMMUNITY): Payer: Medicare Other

## 2020-07-26 DIAGNOSIS — G510 Bell's palsy: Secondary | ICD-10-CM | POA: Diagnosis not present

## 2020-07-26 MED ORDER — VALACYCLOVIR HCL 500 MG PO TABS
1000.0000 mg | ORAL_TABLET | Freq: Once | ORAL | Status: AC
  Administered 2020-07-26: 1000 mg via ORAL
  Filled 2020-07-26: qty 2

## 2020-07-26 MED ORDER — PREDNISONE 20 MG PO TABS
60.0000 mg | ORAL_TABLET | Freq: Once | ORAL | Status: AC
  Administered 2020-07-26: 60 mg via ORAL
  Filled 2020-07-26: qty 3

## 2020-07-26 MED ORDER — VALACYCLOVIR HCL 1 G PO TABS: 1000.0000 mg | ORAL_TABLET | Freq: Three times a day (TID) | ORAL | 0 refills | Status: DC

## 2020-07-26 MED ORDER — PREDNISONE 10 MG PO TABS: 60.0000 mg | ORAL_TABLET | Freq: Every day | ORAL | 0 refills | Status: AC

## 2020-07-26 MED ORDER — VALACYCLOVIR HCL 1 G PO TABS: 1000.0000 mg | ORAL_TABLET | Freq: Three times a day (TID) | ORAL | 0 refills | Status: AC

## 2020-07-26 NOTE — ED Notes (Signed)
Pt to follow up with Neurology on Nov 2, per existing appointment.  Ambulated from tx area with walker, accompanied by sister.

## 2020-07-26 NOTE — Discharge Instructions (Addendum)
Thank you for allowing me to care for you today in the Emergency Department.   You were seen today for left-sided facial droop.  Your MRI did not show a stroke.  You were diagnosed with Bell's palsy.  Take 60 mg of prednisone daily for the next 6 days.  Your first dose was given tonight in the emergency department.  You should not take your second dose until tomorrow.  Take at 1000 mg of valacyclovir every 8 hours for the next week.  Your first dose was given tonight in the ER.  Because of Bell's palsy, you are unable to fully close your left eye.  It is very important that you properly care for the left eye to avoid injury.  Artificial Tears are eyedrops that are available over-the-counter at the pharmacy.  Please purchase a bottle of both the drops and the gel (2 bottles in total).    While you are awake, apply 1 drop in the left eye every 6 hours.  However, if you feel as if your eye is dry, you can apply 1 drop every hour as needed while you are awake.  While you are sleeping, artificial tears ointment can be applied to the eye before you nap or go to sleep.  You should carefully tape the left eye shut using a medical grade waterproof dressing or tape.  The eye should be examined to make sure the eyelid remains completely closed under the dressing.  Do not place an eye patch over the eye or eye mask as this can disrupt the tape and cause the eye to come open while you are sleeping. I would not wear a weighted eye mask or patch on the eye.   If you continue to have dryness of the eye or concern for exposing your eye while you are asleep despite following the instructions above, please follow-up with an eye doctor.  If you are not established with an eye doctor, your primary care physician can provide you with a referral.  -----  With Bell's palsy, your symptoms may continue to gradually worsen over the next 48 hours.  After that time, you should not have worsening symptoms.  It is possible  during the first few weeks that your symptoms may gradually worsen and get better.  However, by 3 weeks your symptoms should be stable and not worsening.  You should start to see gradual improvement in the next 2 to 3 months.  In approximately 70% of patients, symptoms are resolved in 3 to 6 months.  After 3 weeks, if you start to see new or worsening symptoms including visual changes, numbness, weakness, symptoms on the right side of the face or body, you should return to the ER for evaluation of a stroke.  I would also recommend following up with your primary care physician in the next 3 weeks for reevaluation of your symptoms.

## 2020-08-01 ENCOUNTER — Telehealth: Payer: Self-pay

## 2020-08-01 NOTE — Telephone Encounter (Signed)
Patient sister came into clinic requesting medication refill for   predniSONE (DELTASONE) 10 MG tablet   valACYclovir (VALTREX) 1000 MG tablet   Patient sister states her brother was seen at the Emergency Room for Bells Palsy. Patient sister states that her brother will need more medication.   Please advice [phone]  Patient uses [id number] - [place], [place]

## 2020-08-01 NOTE — Telephone Encounter (Signed)
Will forward to pcp

## 2020-08-02 NOTE — Telephone Encounter (Signed)
Returned pt call and spoke with Micheal Likens pt nephew and went over Dr. Wynetta Emery response Pt nephew states he will let pt know and thanks for a call back

## 2020-08-18 ENCOUNTER — Other Ambulatory Visit: Payer: Self-pay | Admitting: Neurology

## 2020-08-22 NOTE — Progress Notes (Signed)
PATIENT: [name] DOB: April 22, 1951  REASON FOR VISIT: follow up HISTORY FROM: patient  HISTORY OF PRESENT ILLNESS: Today 08/23/20 Mr. Tuller is a 69 year old male history of seizures and memory disorder.  He is on Dilantin and Keppra.  He lives with his sister, uses a walker.  Presented to the ER 07/25/20 with left ptosis, left-sided asymmetric smile, inability to raise his left forehead.  MRI of the brain was negative for acute findings, diagnosed with left-sided Bell's palsy, was treated with Valtrex and prednisone, given onset of Bell's palsy within 24 hours.  Was unable to fully close his left eye.  CMP showed mildly elevated alk phos 137, glucose 106.  He is about 75% better, able to close the eye about 85%, still left-sided asymmetric smile, left eye closure weakness.  No drooling. Has been taping his eye at night.  No seizures reported, tolerating medications.  No falls.  No history of DM.  Presents today for evaluation accompanied by his sister.  HISTORY 02/18/2020 SS: Mr. Wadlow is a 69 year old male with history of seizures and memory disorder.  He was started on Dilantin around April 2020 after reported seizures (frequent staring spells every 2 weeks, 1 episode lying down, his whole body drew up).  He has been established at a good maintenance dose of Dilantin 225 mg daily, along with Keppra 1500 mg daily.  Since last seen, his sister reports 1-2 episodes of brief staring episodes, unsure exactly if these represent true seizures.  He lives with his sister, [name].  She manages his medications and helps with his care.  He has a left hemiparesis.  He uses a walker.  He has seen GI for elevated alkaline phosphatase, in the setting of a positive antimitochondrial antibody and anti-smooth muscle antibody, undergoing evaluation. Is overall doing well with medications, is overall pleased, no recent falls.   REVIEW OF SYSTEMS: Out of a complete 14 system review of symptoms, the patient complains  only of the following symptoms, and all other reviewed systems are negative.  Seizure  ALLERGIES: No Known Allergies  HOME MEDICATIONS: Outpatient Medications Prior to Visit  Medication Sig Dispense Refill  . aspirin EC 81 MG tablet Take 1 tablet (81 mg total) by mouth daily. 100 tablet 0  . memantine (NAMENDA) 10 MG tablet TAKE 1 TABLET BY MOUTH TWICE A DAY 180 tablet 0  . metoprolol succinate (TOPROL-XL) 25 MG 24 hr tablet Take 1 tablet (25 mg total) by mouth daily. 90 tablet 2  . simvastatin (ZOCOR) 20 MG tablet Take 1 tablet (20 mg total) by mouth daily at 6 PM. 90 tablet 2  . Sodium Sulfate-Mag Sulfate-KCl (SUTAB) 909 [id number] 2329 MG TABS Take 1 kit by mouth as directed. BIN: [id number] PCN: CN GROUP: [id number] MEMBER ID: [id number]; RUN AS CASH 24 tablet 0  . URSODIOL PO Take 500 mg by mouth in the morning and at bedtime.    . levETIRAcetam (KEPPRA) 750 MG tablet Take 2 tablets (1,500 mg total) by mouth 2 (two) times daily. 360 tablet 3  . phenytoin (DILANTIN) 100 MG ER capsule Take 2 capsules (200 mg total) by mouth at bedtime. 180 capsule 1  . PHENYTOIN INFATABS 50 MG tablet CHEW 0.5 TABLETS (25 MG TOTAL) BY MOUTH AT BEDTIME. 45 tablet 1   No facility-administered medications prior to visit.    PAST MEDICAL HISTORY: Past Medical History:  Diagnosis Date  . Alcoholism ([place])    History of, not active  . Chronic hepatitis C ([place])   .  Dementia ([facility])   . Diverticulosis   . Gait disorder   . 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 Hemiparesis and alteration of sensations as late effects of stroke ([facility]) 01/25/2017   Left hemiparesis  . Hiatal hernia   . History of alcoholism ([facility])   . Hx of ischemic vertebrobasilar artery thalamic stroke    Right  . Internal hemorrhoids   . Memory disorder 03/04/2013  . Seizures ([facility])   . Stroke [facility])    Right frontal, right thalamic  . Tubular adenoma of colon   . Ulnar neuropathy of left upper extremity     PAST SURGICAL  HISTORY: Past Surgical History:  Procedure Laterality Date  . COLONOSCOPY WITH PROPOFOL N/A 07/10/2017   Procedure: COLONOSCOPY WITH PROPOFOL;  Surgeon: Kathi Der, MD;  Location: [facility] ENDOSCOPY;  Service: Gastroenterology;  Laterality: N/A;  . ESOPHAGOGASTRODUODENOSCOPY (EGD) WITH PROPOFOL N/A 07/10/2017   Procedure: ESOPHAGOGASTRODUODENOSCOPY (EGD) WITH PROPOFOL;  Surgeon: Kathi Der, MD;  Location: [facility] ENDOSCOPY;  Service: Gastroenterology;  Laterality: N/A;  . HIP ARTHROPLASTY Left 11/26/2013   Procedure: LEFT HIP HEMIARTHROPLASTY;  Surgeon: Kathryne Hitch, MD;  Location: [facility] OR;  Service: Orthopedics;  Laterality: Left;  . WRIST SURGERY Left 95 & 96    FAMILY HISTORY: Family History  Problem Relation Age of Onset  . Pulmonary embolism Mother   . Liver disease Father   . Diabetes Sister   . Hypertension Sister   . Hypertension Sister   . Hypertension Sister   . Seizures Neg Hx     SOCIAL HISTORY: Social History   Socioeconomic History  . Marital status: Divorced    Spouse name: Not on file  . Number of children: 0  . Years of education: 10  . Highest education level: Not on file  Occupational History  . Not on file  Tobacco Use  . Smoking status: Former Smoker    Packs/day: 3.00    Quit date: 01/02/2014    Years since quitting: 6.6  . Smokeless tobacco: Former Engineer, water and Sexual Activity  . Alcohol use: No    Alcohol/week: 0.0 standard drinks    Comment: Former Alcoholic  . Drug use: No  . Sexual activity: Yes  Other Topics Concern  . Not on file  Social History Narrative   Patient is single and lives with a friend and her son.   Patient does not have any children.   Patient is disabled.   Patient has a 10 th grade education.   Patient is left-handed.   Patient drinks some soda but not everyday.   Social Determinants of Health   Financial Resource Strain:   . Difficulty of Paying Living Expenses: Not on file  Food Insecurity:   .  Worried About Programme researcher, broadcasting/film/video in the Last Year: Not on file  . Ran Out of Food in the Last Year: Not on file  Transportation Needs:   . Lack of Transportation (Medical): Not on file  . Lack of Transportation (Non-Medical): Not on file  Physical Activity:   . Days of Exercise per Week: Not on file  . Minutes of Exercise per Session: Not on file  Stress:   . Feeling of Stress : Not on file  Social Connections:   . Frequency of Communication with Friends and Family: Not on file  . Frequency of Social Gatherings with Friends and Family: Not on file  . Attends Religious Services: Not on file  . Active Member  of Clubs or Organizations: Not on file  . Attends Archivist Meetings: Not on file  . Marital Status: Not on file  Intimate Partner Violence:   . Fear of Current or Ex-Partner: Not on file  . Emotionally Abused: Not on file  . Physically Abused: Not on file  . Sexually Abused: Not on file    PHYSICAL EXAM  Vitals:   08/23/20 1038  BP: 123/72  Pulse: 89  Weight: 174 lb 12.8 oz (79.3 kg)  Height: _0  (1.753 m)   Body mass index is 25.81 kg/m.  Generalized: Well developed, in no acute distress   Neurological examination  Mentation: Alert, limited verbal response, most history is provided by his sister, follows most exam commands well, is well-appearing and groomed Cranial nerve II-XII: Pupils were equal round reactive to light. Extraocular movements were full, visual field were full on confrontational test. Able to close left eye 85%, mild left eye closure weakness, no cheek puff weakness, asymmetric left-sided smile, left shoulder is less than right position Motor: Good strength of all extremities.  Sensory: Decreased sensation to soft touch reported to the left leg compared to the right Coordination: Mild dysmetria on the left finger-nose-finger Gait and station: Rises from seated position with pushoff, ambulates with rolling walker, is wide-based Reflexes:  Deep tendon reflexes are symmetric but decreased throughout   DIAGNOSTIC DATA (LABS, IMAGING, TESTING) - I reviewed patient records, labs, notes, testing and imaging myself where available.  Lab Results  Component Value Date   WBC 5.6 07/25/2020   HGB 14.6 07/25/2020   HCT 43.0 07/25/2020   MCV 92.9 07/25/2020   PLT 179 07/25/2020      Component Value Date/Time   NA 139 07/25/2020 2156   NA 139 02/18/2020 1140   K 4.1 07/25/2020 2156   CL 101 07/25/2020 2156   CO2 26 07/25/2020 2138   GLUCOSE 101 (H) 07/25/2020 2156   BUN 12 07/25/2020 2156   BUN 15 02/18/2020 1140   CREATININE 0.90 07/25/2020 2156   CREATININE 0.93 04/08/2017 1642   CALCIUM 9.3 07/25/2020 2138   PROT 7.4 07/25/2020 2138   PROT 7.3 02/18/2020 1140   ALBUMIN 3.9 07/25/2020 2138   ALBUMIN 4.5 02/18/2020 1140   AST 11 (L) 07/25/2020 2138   ALT 9 07/25/2020 2138   ALKPHOS 137 (H) 07/25/2020 2138   BILITOT 0.6 07/25/2020 2138   BILITOT 0.4 02/18/2020 1140   GFRNONAA >60 07/25/2020 2138   GFRNONAA 86 04/08/2017 1642   GFRAA >60 07/25/2020 2138   GFRAA >89 04/08/2017 1642   Lab Results  Component Value Date   CHOL 126 09/11/2019   HDL 30 (L) 09/11/2019   LDLCALC 75 09/11/2019   TRIG 113 09/11/2019   CHOLHDL 4.2 09/11/2019   Lab Results  Component Value Date   HGBA1C 5.3 05/23/2016   No results found for: [id number] Lab Results  Component Value Date   TSH 2.292 03/02/2015   ASSESSMENT AND PLAN 69 y.o. year old male  has a past medical history of Alcoholism ([place]), Chronic hepatitis C ([place]), Dementia ([place]), Diverticulosis, Gait disorder, Gingival hypertrophy, Hemiparesis and alteration of sensations as late effects of stroke ([place]) (01/25/2017), Hiatal hernia, History of alcoholism ([place]), ischemic vertebrobasilar artery thalamic stroke, Internal hemorrhoids, Memory disorder (03/04/2013), Seizures ([place]), Stroke ([place]), Tubular adenoma of colon, and Ulnar neuropathy of left upper extremity. here with:  1.   Left-sided Bell's palsy 07/25/2020 -is about 75% back to baseline 1 month out -suggest eyedrops during  the day, ointment at night, able to close the eye about 85%, is still taping the eye, his sister will watch the eye for closure, can likely d/c taping soon, continue ointment and drops -Will hopefully continue to improve in the next few months  2.  Seizure disorder 3.  Cerebrovascular disease, left hemiparesis 4.  Gait disorder -No recurrent seizures -Continue Keppra 750 mg tablet, 2 tablets twice a day -Continue Dilantin total of 225 mg at bedtime  -Dilantin level in April 2021 was therapeutic at 13.3  I will see him back in 3 months or sooner if needed to check on improvement from [place], will check blood work at next visit.  MRI of the brain 07/26/2020  IMPRESSION: 1. No acute intracranial abnormality. 2. Chronic right temporoparietal infarct with associated encephalomalacia and gliosis. 3. Moderately advanced cerebral and cerebellar atrophy. Associated ventriculomegaly with ex vacuo dilatation of the right lateral ventricle, similar to previous exams.  I spent 30 minutes of face-to-face and non-face-to-face time with patient.  This included previsit chart review, lab review, study review, order entry, electronic health record documentation, patient education.  Butler Denmark, AGNP-C, DNP 08/23/2020, 11:15 AM [facility] [address], [place], [place] [address] [phone]

## 2020-08-23 ENCOUNTER — Other Ambulatory Visit: Payer: Self-pay

## 2020-08-23 ENCOUNTER — Encounter: Payer: Self-pay | Admitting: Neurology

## 2020-08-23 ENCOUNTER — Ambulatory Visit (INDEPENDENT_AMBULATORY_CARE_PROVIDER_SITE_OTHER): Payer: Medicare Other | Admitting: Neurology

## 2020-08-23 VITALS — BP 123/72 | HR 89 | Ht 69.0 in | Wt 174.8 lb

## 2020-08-23 DIAGNOSIS — G40209 Localization-related (focal) (partial) symptomatic epilepsy and epileptic syndromes with complex partial seizures, not intractable, without status epilepticus: Secondary | ICD-10-CM | POA: Diagnosis not present

## 2020-08-23 DIAGNOSIS — G510 Bell's palsy: Secondary | ICD-10-CM | POA: Diagnosis not present

## 2020-08-23 MED ORDER — PHENYTOIN 50 MG PO CHEW: CHEWABLE_TABLET | ORAL | 1 refills | Status: DC

## 2020-08-23 MED ORDER — LEVETIRACETAM 750 MG PO TABS: 1500.0000 mg | ORAL_TABLET | Freq: Two times a day (BID) | ORAL | 3 refills | Status: DC

## 2020-08-23 MED ORDER — PHENYTOIN SODIUM EXTENDED 100 MG PO CAPS: 200.0000 mg | ORAL_CAPSULE | Freq: Every day | ORAL | 1 refills | Status: DC

## 2020-08-23 NOTE — Patient Instructions (Signed)
Continue current medications  I hope the Bell's palsy symptoms will continue to improve Get some OTC eye drops for the left eye for lubricant Call for seizures  See you back in 5 months

## 2020-08-24 NOTE — Progress Notes (Signed)
I have read the note, and I agree with the clinical assessment and plan.  Chandria Rookstool K Deardra Hinkley   

## 2020-09-02 ENCOUNTER — Other Ambulatory Visit: Payer: Self-pay | Admitting: Internal Medicine

## 2020-09-02 DIAGNOSIS — I10 Essential (primary) hypertension: Secondary | ICD-10-CM

## 2020-09-05 ENCOUNTER — Other Ambulatory Visit: Payer: Self-pay | Admitting: Internal Medicine

## 2020-09-05 ENCOUNTER — Telehealth: Payer: Self-pay | Admitting: Neurology

## 2020-09-05 NOTE — Telephone Encounter (Signed)
Pt is requesting a refill for memantine (NAMENDA) 10 MG tablet.  Pharmacy: [id number]  

## 2020-09-05 NOTE — Telephone Encounter (Signed)
I called pt and LMVM for him and family member that the memantine is last prescribed by pcp.  07-01-20 for 3 months.  If questions may call us back.

## 2020-09-06 ENCOUNTER — Other Ambulatory Visit: Payer: Self-pay | Admitting: Internal Medicine

## 2020-09-07 NOTE — Telephone Encounter (Signed)
I called and LMVM for nephew,  [name], that I was f/u on call from yesterday , he had call back, but I was not able to talk with him due to on phone.  I called back to check if able to get memantine, from pcp.

## 2020-09-13 ENCOUNTER — Ambulatory Visit: Payer: Medicare Other | Attending: Internal Medicine

## 2020-09-13 DIAGNOSIS — Z23 Encounter for immunization: Secondary | ICD-10-CM

## 2020-09-13 NOTE — Progress Notes (Signed)
   Covid-19 Vaccination Clinic  Name:  [name]    MRN: [id number] DOB: [birth year]  09/13/2020  [name] was observed post Covid-19 immunization for 15 minutes without incident. He was provided with Vaccine Information Sheet and instruction to access the V-Safe system.   [name] was instructed to call 911 with any severe reactions post vaccine: Marland Kitchen Difficulty breathing  . Swelling of face and throat  . A fast heartbeat  . A bad rash all over body  . Dizziness and weakness   Immunizations Administered    No immunizations on file.

## 2020-09-15 ENCOUNTER — Other Ambulatory Visit: Payer: Self-pay | Admitting: Internal Medicine

## 2020-09-15 DIAGNOSIS — E785 Hyperlipidemia, unspecified: Secondary | ICD-10-CM

## 2020-09-15 NOTE — Telephone Encounter (Signed)
Requested medications are due for refill today yes  Requested medications are on the active medication list yes  Last refill 06/11/20   Notes to clinic Failed protocol due to lab work is more than 18 days old.

## 2020-10-12 ENCOUNTER — Other Ambulatory Visit: Payer: Self-pay | Admitting: Internal Medicine

## 2020-10-19 ENCOUNTER — Other Ambulatory Visit: Payer: Self-pay | Admitting: *Deleted

## 2020-10-19 MED ORDER — URSODIOL 500 MG PO TABS: 500.0000 mg | ORAL_TABLET | Freq: Two times a day (BID) | ORAL | 0 refills | Status: DC

## 2020-11-10 ENCOUNTER — Other Ambulatory Visit: Payer: Self-pay | Admitting: Internal Medicine

## 2020-11-14 ENCOUNTER — Encounter: Payer: Self-pay | Admitting: Internal Medicine

## 2020-11-14 ENCOUNTER — Other Ambulatory Visit (INDEPENDENT_AMBULATORY_CARE_PROVIDER_SITE_OTHER): Payer: Medicare Other

## 2020-11-14 ENCOUNTER — Ambulatory Visit (INDEPENDENT_AMBULATORY_CARE_PROVIDER_SITE_OTHER): Payer: Medicare Other | Admitting: Internal Medicine

## 2020-11-14 VITALS — BP 120/70 | HR 80 | Ht 66.5 in | Wt 168.4 lb

## 2020-11-14 DIAGNOSIS — Z860100 Personal history of colon polyps, unspecified: Secondary | ICD-10-CM

## 2020-11-14 DIAGNOSIS — K743 Primary biliary cirrhosis: Secondary | ICD-10-CM | POA: Diagnosis not present

## 2020-11-14 LAB — PROTIME-INR
INR: 1.2 ratio — ABNORMAL HIGH (ref 0.8–1.0)
Prothrombin Time: 13 s (ref 9.6–13.1)

## 2020-11-14 LAB — HEPATIC FUNCTION PANEL
ALT: 7 U/L (ref 0–53)
AST: 13 U/L (ref 0–37)
Albumin: 4.3 g/dL (ref 3.5–5.2)
Alkaline Phosphatase: 142 U/L — ABNORMAL HIGH (ref 39–117)
Bilirubin, Direct: 0.1 mg/dL (ref 0.0–0.3)
Total Bilirubin: 0.2 mg/dL (ref 0.2–1.2)
Total Protein: 7.5 g/dL (ref 6.0–8.3)

## 2020-11-14 LAB — CBC WITH DIFFERENTIAL/PLATELET
Basophils Absolute: 0.1 10*3/uL (ref 0.0–0.1)
Basophils Relative: 0.9 % (ref 0.0–3.0)
Eosinophils Absolute: 0.3 10*3/uL (ref 0.0–0.7)
Eosinophils Relative: 3.3 % (ref 0.0–5.0)
HCT: 45.6 % (ref 39.0–52.0)
Hemoglobin: 16.4 g/dL (ref 13.0–17.0)
Lymphocytes Relative: 24.3 % (ref 12.0–46.0)
Lymphs Abs: 2.1 10*3/uL (ref 0.7–4.0)
MCHC: 35.9 g/dL (ref 30.0–36.0)
MCV: 93.9 fl (ref 78.0–100.0)
Monocytes Absolute: 0.9 10*3/uL (ref 0.1–1.0)
Monocytes Relative: 10.7 % (ref 3.0–12.0)
Neutro Abs: 5.2 10*3/uL (ref 1.4–7.7)
Neutrophils Relative %: 60.8 % (ref 43.0–77.0)
Platelets: 204 10*3/uL (ref 150.0–400.0)
RBC: 4.86 Mil/uL (ref 4.22–5.81)
RDW: 12.1 % (ref 11.5–15.5)
WBC: 8.6 10*3/uL (ref 4.0–10.5)

## 2020-11-14 MED ORDER — SUTAB 1479-225-188 MG PO TABS
ORAL_TABLET | ORAL | 0 refills | Status: DC
Start: 2020-11-14 — End: 2021-01-23

## 2020-11-14 NOTE — Patient Instructions (Signed)
You have been scheduled for an endoscopy and colonoscopy. Please follow the written instructions given to you at your visit today. Please pick up your prep supplies at the pharmacy within the next 1-3 days. If you use inhalers (even only as needed), please bring them with you on the day of your procedure.  Continue Ursodiol 500 mg twice daily  Your provider has requested that you go to the basement level for lab work before leaving today. Press "B" on the elevator. The lab is located at the first door on the left as you exit the elevator.  If you are age 25 or older, your body mass index should be between 23-30. Your Body mass index is 26.77 kg/m. If this is out of the aforementioned range listed, please consider follow up with your Primary Care Provider.  If you are age 24 or younger, your body mass index should be between 19-25. Your Body mass index is 26.77 kg/m. If this is out of the aformentioned range listed, please consider follow up with your Primary Care Provider.   Due to recent changes in healthcare laws, you may see the results of your imaging and laboratory studies on MyChart before your provider has had a chance to review them.  We understand that in some cases there may be results that are confusing or concerning to you. Not all laboratory results come back in the same time frame and the provider may be waiting for multiple results in order to interpret others.  Please give [place] 48 hours in order for your provider to thoroughly review all the results before contacting the office for clarification of your results.

## 2020-11-14 NOTE — Progress Notes (Signed)
Subjective:    Patient ID: [name], male    DOB: 08/30/1951, 70 y.o.   MRN: [id number]  HPI [name] is a 70 year old male with a past medical history of PBC (elevated alkaline phosphatase and antimitochondrial antibody positive), prior viral hepatitis C treated with Harvoni with SVR, history of adenomatous colon polyps, history of dementia, prior alcohol abuse, seizure disorder who is here today for follow-up.  He is here today with his sister and I last saw him on 06/15/2020.  We scheduled an upper endoscopy and colonoscopy at last visit but these were canceled by the patient due to concern of high expense and inability to afford these procedures.  From a medical perspective he reports he is doing and feeling well.  He is not having abdominal pain.  No jaundice.  No itching.  No ascites or lower extremity edema.  He has continued ursodiol 500 mg twice daily.  Bowel habits are regular without blood or melena.  Good appetite.  He denies diarrhea and constipation.  He did have Bell's palsy in October which resolved entirely.  No recent seizures.   Review of Systems As per HPI, otherwise negative  Current Medications, Allergies, Past Medical History, Past Surgical History, Family History and Social History were reviewed in [employer] record.     Objective:   Physical Exam BP 120/70 (BP Location: Left Arm, Patient Position: Sitting, Cuff Size: Normal)   Pulse 80   Ht 5' 6.5" (1.689 m) Comment: height measured without shoes  Wt 168 lb 6 oz (76.4 kg)   BMI 26.77 kg/m  Gen: awake, alert, NAD HEENT: anicteric, op clear CV: RRR, no mrg Pulm: CTA b/l Abd: soft, NT/ND, +BS throughout Ext: no c/c/e Neuro: nonfocal, no asterixis  CMP     Component Value Date/Time   NA 139 07/25/2020 2156   NA 139 02/18/2020 1140   K 4.1 07/25/2020 2156   CL 101 07/25/2020 2156   CO2 26 07/25/2020 2138   GLUCOSE 101 (H) 07/25/2020 2156   BUN 12 07/25/2020 2156   BUN  15 02/18/2020 1140   CREATININE 0.90 07/25/2020 2156   CREATININE 0.93 04/08/2017 1642   CALCIUM 9.3 07/25/2020 2138   PROT 7.4 07/25/2020 2138   PROT 7.3 02/18/2020 1140   ALBUMIN 3.9 07/25/2020 2138   ALBUMIN 4.5 02/18/2020 1140   AST 11 (L) 07/25/2020 2138   ALT 9 07/25/2020 2138   ALKPHOS 137 (H) 07/25/2020 2138   BILITOT 0.6 07/25/2020 2138   BILITOT 0.4 02/18/2020 1140   GFRNONAA >60 07/25/2020 2138   GFRNONAA 86 04/08/2017 1642   GFRAA >60 07/25/2020 2138   GFRAA >89 04/08/2017 1642   Lab Results  Component Value Date   INR 1.1 07/25/2020   INR 1.2 (H) 06/15/2020   INR 1.1 (H) 12/22/2019       Assessment & Plan:  70 year old male with a past medical history of PBC (elevated alkaline phosphatase and antimitochondrial antibody positive), prior viral hepatitis C treated with Harvoni with SVR, history of adenomatous colon polyps, history of dementia, prior alcohol abuse, seizure disorder who is here today for follow-up.   1. PBC --he met diagnostic criteria for primary biliary cholangitis though did not receive a biopsy.  With ursodiol his alkaline phosphatase has been steadily improving.  There is no overt evidence of cirrhosis though prior imaging revealed an enlarged caudate lobe.  He has previously treated hepatitis C and alcohol abuse in remission --Continue ursodiol 500 mg twice  daily --Repeat hepatic function panel, CBC and INR today --EGD remains recommended to exclude upper GI tract changes of portal hypertension given the large caudate lobe and diagnosis of PBC  2.  History of adenomatous and tubulovillous adenomas of the colon --slightly overdue for surveillance colonoscopy.  Colonoscopy is recommended.  He felt that procedures were cost prohibitive but with Medicare and Medicaid these should be covered fully by his insurance company.  We will try to verify this for him and they are aware that both his upper endoscopy and colonoscopy remains recommended.  We discussed  the risk, benefits and alternatives and he is agreeable and wishes to proceed --Schedule surveillance colonoscopy.  3.  History of hepatitis C --treated successfully with Harvoni with sustained virologic response  30 minutes total spent today including patient facing time, coordination of care, reviewing medical history/procedures/pertinent radiology studies, and documentation of the encounter.

## 2020-11-15 ENCOUNTER — Other Ambulatory Visit: Payer: Self-pay

## 2020-11-15 ENCOUNTER — Other Ambulatory Visit: Payer: Self-pay | Admitting: Internal Medicine

## 2020-11-15 DIAGNOSIS — K743 Primary biliary cirrhosis: Secondary | ICD-10-CM

## 2020-11-25 ENCOUNTER — Other Ambulatory Visit: Payer: Self-pay | Admitting: Internal Medicine

## 2020-11-25 DIAGNOSIS — I10 Essential (primary) hypertension: Secondary | ICD-10-CM

## 2020-11-29 ENCOUNTER — Other Ambulatory Visit: Payer: Self-pay | Admitting: Internal Medicine

## 2020-12-20 ENCOUNTER — Encounter: Payer: Medicare Other | Admitting: Internal Medicine

## 2021-01-05 ENCOUNTER — Ambulatory Visit: Payer: Medicare Other | Admitting: Internal Medicine

## 2021-01-23 ENCOUNTER — Ambulatory Visit (INDEPENDENT_AMBULATORY_CARE_PROVIDER_SITE_OTHER): Payer: Medicare Other | Admitting: Neurology

## 2021-01-23 ENCOUNTER — Encounter: Payer: Self-pay | Admitting: Neurology

## 2021-01-23 ENCOUNTER — Other Ambulatory Visit: Payer: Self-pay

## 2021-01-23 VITALS — BP 116/69 | HR 94 | Ht 66.5 in | Wt 164.4 lb

## 2021-01-23 DIAGNOSIS — G510 Bell's palsy: Secondary | ICD-10-CM | POA: Diagnosis not present

## 2021-01-23 DIAGNOSIS — R269 Unspecified abnormalities of gait and mobility: Secondary | ICD-10-CM

## 2021-01-23 DIAGNOSIS — R569 Unspecified convulsions: Secondary | ICD-10-CM | POA: Diagnosis not present

## 2021-01-23 MED ORDER — PHENYTOIN 50 MG PO CHEW: CHEWABLE_TABLET | ORAL | 3 refills | Status: DC

## 2021-01-23 MED ORDER — LEVETIRACETAM 750 MG PO TABS: 1500.0000 mg | ORAL_TABLET | Freq: Two times a day (BID) | ORAL | 3 refills | Status: DC

## 2021-01-23 MED ORDER — PHENYTOIN SODIUM EXTENDED 100 MG PO CAPS: 200.0000 mg | ORAL_CAPSULE | Freq: Every day | ORAL | 3 refills | Status: DC

## 2021-01-23 NOTE — Patient Instructions (Signed)
Continue current medications Call for seizures  Check labs today  See you back in 1 year

## 2021-01-23 NOTE — Progress Notes (Signed)
PATIENT: [name] DOB: 1951-08-06  REASON FOR VISIT: follow up HISTORY FROM: patient  HISTORY OF PRESENT ILLNESS: Today 01/23/21 [name] is a 70 year old male with history of seizures and memory disorder, left hemiparesis.  On Dilantin and Keppra.  He had left-sided Bell's palsy in November 2021.  No recent seizures.  Left-sided Bell's palsy has resolved.  Can now completely close the left eye, with smiling very mild left eye closure, unsure if baseline or not.  Continues to live with his sister, at times does not want to take a shower.  He does well to take his medications, his sister manages these.  No falls, uses a walker.  They have no complaints today.  He is here today for follow-up accompanied by his sister, [name].  Update 08/23/2020 SS:[name] is a 70 year old male history of seizures and memory disorder.  He is on Dilantin and Keppra.  He lives with his sister, uses a walker.  Presented to the ER 07/25/20 with left ptosis, left-sided asymmetric smile, inability to raise his left forehead.  MRI of the brain was negative for acute findings, diagnosed with left-sided Bell's palsy, was treated with Valtrex and prednisone, given onset of Bell's palsy within 24 hours.  Was unable to fully close his left eye.  CMP showed mildly elevated alk phos 137, glucose 106.  He is about 75% better, able to close the eye about 85%, still left-sided asymmetric smile, left eye closure weakness.  No drooling. Has been taping his eye at night.  No seizures reported, tolerating medications.  No falls.  No history of DM.  Presents today for evaluation accompanied by his sister.  HISTORY 02/18/2020 SS: [name] is a 70 year old male with history of seizures and memory disorder.  He was started on Dilantin around April 2020 after reported seizures (frequent staring spells every 2 weeks, 1 episode lying down, his whole body drew up).  He has been established at a good maintenance dose of Dilantin 225 mg  daily, along with Keppra 1500 mg daily.  Since last seen, his sister reports 1-2 episodes of brief staring episodes, unsure exactly if these represent true seizures.  He lives with his sister, [name].  She manages his medications and helps with his care.  He has a left hemiparesis.  He uses a walker.  He has seen GI for elevated alkaline phosphatase, in the setting of a positive antimitochondrial antibody and anti-smooth muscle antibody, undergoing evaluation. Is overall doing well with medications, is overall pleased, no recent falls.   REVIEW OF SYSTEMS: Out of a complete 14 system review of symptoms, the patient complains only of the following symptoms, and all other reviewed systems are negative.  Seizure  ALLERGIES: No Known Allergies  HOME MEDICATIONS: Outpatient Medications Prior to Visit  Medication Sig Dispense Refill  . aspirin EC 81 MG tablet Take 1 tablet (81 mg total) by mouth daily. 100 tablet 0  . levETIRAcetam (KEPPRA) 750 MG tablet Take 2 tablets (1,500 mg total) by mouth 2 (two) times daily. 360 tablet 3  . memantine (NAMENDA) 10 MG tablet TAKE 1 TABLET BY MOUTH TWICE A DAY 180 tablet 0  . metoprolol succinate (TOPROL-XL) 25 MG 24 hr tablet TAKE 1 TABLET BY MOUTH EVERY DAY 90 tablet 0  . phenytoin (DILANTIN) 100 MG ER capsule Take 2 capsules (200 mg total) by mouth at bedtime. 180 capsule 1  . phenytoin (PHENYTOIN INFATABS) 50 MG tablet CHEW 0.5 TABLETS (25 MG TOTAL) BY MOUTH  AT BEDTIME. 45 tablet 1  . simvastatin (ZOCOR) 20 MG tablet TAKE 1 TABLET (20 MG TOTAL) BY MOUTH DAILY AT 6 PM. 90 tablet 1  . ursodiol (ACTIGALL) 500 MG tablet Take 1 tablet (500 mg total) by mouth in the morning and at bedtime. MUST KEEP 11/14/20 APPT FOR ANY ADDITIONAL REFILLS. 60 tablet 0  . Sodium Sulfate-Mag Sulfate-KCl (SUTAB) 1479-225-188 MG TABS Use as directed for colonoscopy. MANUFACTURER CODES!! BIN: [id number] PCN: CN GROUP: [id number] MEMBER ID: [id number];RUN AS SECONDARY INSURANCE ;NO PRIOR  AUTHORIZATION 24 tablet 0   No facility-administered medications prior to visit.    PAST MEDICAL HISTORY: Past Medical History:  Diagnosis Date  . Alcoholism (HCC)    History of, not active  . Chronic hepatitis C (HCC)   . Dementia (HCC)   . Diverticulosis   . Gait disorder   . 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 Hemiparesis and alteration of sensations as late effects of stroke (HCC) 01/25/2017   Left hemiparesis  . Hiatal hernia   . History of alcoholism (HCC)   . Hx of ischemic vertebrobasilar artery thalamic stroke    Right  . Internal hemorrhoids   . Memory disorder 03/04/2013  . Seizures (HCC)   . Stroke (HCC)    Right frontal, right thalamic  . Tubular adenoma of colon   . Ulnar neuropathy of left upper extremity     PAST SURGICAL HISTORY: Past Surgical History:  Procedure Laterality Date  . COLONOSCOPY WITH PROPOFOL N/A 07/10/2017   Procedure: COLONOSCOPY WITH PROPOFOL;  Surgeon: Brahmbhatt, Parag, MD;  Location: [facility];  Service: Gastroenterology;  Laterality: N/A;  . ESOPHAGOGASTRODUODENOSCOPY (EGD) WITH PROPOFOL N/A 07/10/2017   Procedure: ESOPHAGOGASTRODUODENOSCOPY (EGD) WITH PROPOFOL;  Surgeon: Brahmbhatt, Parag, MD;  Location: [facility];  Service: Gastroenterology;  Laterality: N/A;  . HIP ARTHROPLASTY Left 11/26/2013   Procedure: LEFT HIP HEMIARTHROPLASTY;  Surgeon: Christopher Y Blackman, MD;  Location: [facility];  Service: Orthopedics;  Laterality: Left;  . WRIST SURGERY Left 95 & 96    FAMILY HISTORY: Family History  Problem Relation Age of Onset  . Pulmonary embolism Mother   . Liver disease Father   . Diabetes Sister   . Hypertension Sister   . Hypertension Sister   . Hypertension Sister   . Seizures Neg Hx     SOCIAL HISTORY: Social History   Socioeconomic History  . Marital status: Divorced    Spouse name: Not on file  . Number of children: 0  . Years of education: 10  . Highest education level: Not on file  Occupational  History  . Not on file  Tobacco Use  . Smoking status: Former Smoker    Packs/day: 3.00    Quit date: 01/02/2014    Years since quitting: 7.0  . Smokeless tobacco: Former User  Substance and Sexual Activity  . Alcohol use: No    Alcohol/week: 0.0 standard drinks    Comment: Former Alcoholic  . Drug use: No  . Sexual activity: Yes  Other Topics Concern  . Not on file  Social History Narrative   Patient is single and lives with a friend and her son.   Patient does not have any children.   Patient is disabled.   Patient has a 10 th grade education.   Patient is left-handed.   Patient drinks some soda but not everyday.   Social Determinants of Health   Financial Resource Strain: Not on file  Food Insecurity: Not on file    Transportation Needs: Not on file  Physical Activity: Not on file  Stress: Not on file  Social Connections: Not on file  Intimate Partner Violence: Not on file    PHYSICAL EXAM  Vitals:   01/23/21 0925  BP: 116/69  Pulse: 94  Weight: 164 lb 6.4 oz (74.6 kg)  Height: 5' 6.5" (1.689 m)   Body mass index is 26.14 kg/m.  Generalized: Well developed, in no acute distress  MMSE - Mini Mental State Exam 08/18/2019 01/27/2018 09/28/2015  Orientation to time 0 0 0  Orientation to Place 3 2 3  Registration 3 3 3  Attention/ Calculation 0 0 0  Recall 0 0 0  Language- name 2 objects 2 2 2  Language- repeat 0 1 1  Language- follow 3 step command 2 3 3  Language- read & follow direction 0 0 0  Write a sentence 0 0 0  Copy design 0 0 0  Total score 10 11 12    Neurological examination  Mentation: Alert, limited verbal response, most history is provided by his sister, follows most exam commands well, is well-appearing and groomed Cranial nerve II-XII: Pupils were equal round reactive to light. Extraocular movements were full, visual field were full on confrontational test.  Complete closure of left eye, no eye closure weakness, no cheek puff weakness, facial  symmetry noted, mild closure left eye with smiling Motor: Good strength of all extremities, mild spasticity left upper extremity Sensory: Normal to right and left side Coordination: Mild dysmetria on the left finger-nose-finger Gait and station: Rises from seated position with pushoff, ambulates with rolling walker, is wide-based, limp on the left Reflexes: Deep tendon reflexes are symmetric but decreased throughout   DIAGNOSTIC DATA (LABS, IMAGING, TESTING) - I reviewed patient records, labs, notes, testing and imaging myself where available.  Lab Results  Component Value Date   WBC 8.6 11/14/2020   HGB 16.4 11/14/2020   HCT 45.6 11/14/2020   MCV 93.9 11/14/2020   PLT 204.0 11/14/2020      Component Value Date/Time   NA 139 07/25/2020 2156   NA 139 02/18/2020 1140   K 4.1 07/25/2020 2156   CL 101 07/25/2020 2156   CO2 26 07/25/2020 2138   GLUCOSE 101 (H) 07/25/2020 2156   BUN 12 07/25/2020 2156   BUN 15 02/18/2020 1140   CREATININE 0.90 07/25/2020 2156   CREATININE 0.93 04/08/2017 1642   CALCIUM 9.3 07/25/2020 2138   PROT 7.5 11/14/2020 1626   PROT 7.3 02/18/2020 1140   ALBUMIN 4.3 11/14/2020 1626   ALBUMIN 4.5 02/18/2020 1140   AST 13 11/14/2020 1626   ALT 7 11/14/2020 1626   ALKPHOS 142 (H) 11/14/2020 1626   BILITOT 0.2 11/14/2020 1626   BILITOT 0.4 02/18/2020 1140   GFRNONAA >60 07/25/2020 2138   GFRNONAA 86 04/08/2017 1642   GFRAA >60 07/25/2020 2138   GFRAA >89 04/08/2017 1642   Lab Results  Component Value Date   CHOL 126 09/11/2019   HDL 30 (L) 09/11/2019   LDLCALC 75 09/11/2019   TRIG 113 09/11/2019   CHOLHDL 4.2 09/11/2019   Lab Results  Component Value Date   HGBA1C 5.3 05/23/2016   No results found for: VITAMINB12 Lab Results  Component Value Date   TSH 2.292 03/02/2015   ASSESSMENT AND PLAN 70 y.o. year old male  has a past medical history of Alcoholism (HCC), Chronic hepatitis C (HCC), Dementia (HCC), Diverticulosis, Gait disorder, Gingival  hypertrophy, Hemiparesis and alteration of sensations as   late effects of stroke ([place]) (01/25/2017), Hiatal hernia, History of alcoholism ([place]), ischemic vertebrobasilar artery thalamic stroke, Internal hemorrhoids, Memory disorder (03/04/2013), Seizures ([place]), Stroke ([place]), Tubular adenoma of colon, and Ulnar neuropathy of left upper extremity. here with:  1.  Left-sided Bell's palsy 07/25/2020 -Resolved, mild left eye closure noted with smiling (may be baseline?), otherwise 100% back to baseline, may be aberrant regeneration?   2.  Seizure disorder 3.  Cerebrovascular disease, left hemiparesis 4.  Gait disorder -No recurrent seizures -Continue Keppra 750 mg tablet, 2 tablets twice a day -Continue Dilantin total of 225 mg at bedtime  -Check routine labs today -Follow-up in 1 year or sooner if needed, call for seizure activity  I spent 30 minutes of face-to-face and non-face-to-face time with patient.  This included previsit chart review, lab review, study review, order entry, electronic health record documentation, patient education.  Butler Denmark, AGNP-C, DNP 01/23/2021, 9:44 AM [facility] [address] [place], [place] [address] [phone]

## 2021-01-24 LAB — COMPREHENSIVE METABOLIC PANEL
ALT: 12 IU/L (ref 0–44)
AST: 12 IU/L (ref 0–40)
Albumin/Globulin Ratio: 1.5 (ref 1.2–2.2)
Albumin: 4.6 g/dL (ref 3.8–4.8)
Alkaline Phosphatase: 185 IU/L — ABNORMAL HIGH (ref 44–121)
BUN/Creatinine Ratio: 12 (ref 10–24)
BUN: 11 mg/dL (ref 8–27)
Bilirubin Total: 0.4 mg/dL (ref 0.0–1.2)
CO2: 23 mmol/L (ref 20–29)
Calcium: 9.8 mg/dL (ref 8.6–10.2)
Chloride: 103 mmol/L (ref 96–106)
Creatinine, Ser: 0.9 mg/dL (ref 0.76–1.27)
Globulin, Total: 3 g/dL (ref 1.5–4.5)
Glucose: 95 mg/dL (ref 65–99)
Potassium: 4.8 mmol/L (ref 3.5–5.2)
Sodium: 142 mmol/L (ref 134–144)
Total Protein: 7.6 g/dL (ref 6.0–8.5)
eGFR: 93 mL/min/{1.73_m2} (ref >59)

## 2021-01-24 LAB — CBC WITH DIFFERENTIAL/PLATELET
Basophils Absolute: 0 10*3/uL (ref 0.0–0.2)
Basos: 1 %
EOS (ABSOLUTE): 0.1 10*3/uL (ref 0.0–0.4)
Eos: 3 %
Hematocrit: 45.5 % (ref 37.5–51.0)
Hemoglobin: 15.6 g/dL (ref 13.0–17.7)
Immature Grans (Abs): 0 10*3/uL (ref 0.0–0.1)
Immature Granulocytes: 0 %
Lymphocytes Absolute: 1.1 10*3/uL (ref 0.7–3.1)
Lymphs: 20 %
MCH: 32.9 pg (ref 26.6–33.0)
MCHC: 34.3 g/dL (ref 31.5–35.7)
MCV: 96 fL (ref 79–97)
Monocytes Absolute: 0.5 10*3/uL (ref 0.1–0.9)
Monocytes: 10 %
Neutrophils Absolute: 3.6 10*3/uL (ref 1.4–7.0)
Neutrophils: 66 %
Platelets: 171 10*3/uL (ref 150–450)
RBC: 4.74 x10E6/uL (ref 4.14–5.80)
RDW: 12.5 % (ref 11.6–15.4)
WBC: 5.4 10*3/uL (ref 3.4–10.8)

## 2021-01-24 LAB — PHENYTOIN LEVEL, TOTAL: Phenytoin (Dilantin), Serum: 12 ug/mL (ref 10.0–20.0)

## 2021-01-25 NOTE — Progress Notes (Signed)
I have read the note, and I agree with the clinical assessment and plan.  Carlson Belland K Iran Rowe   

## 2021-01-26 ENCOUNTER — Telehealth: Payer: Self-pay

## 2021-01-26 NOTE — Telephone Encounter (Signed)
-----   Message from Suzzanne Cloud, NP sent at 01/24/2021 12:13 PM EDT ----- Please call the patient, his sister requested to leave a message.  Blood work is unremarkable with the exception of a mildly elevated alkaline phosphatase 185, may be related to Dilantin, will follow over time.  Dilantin level is therapeutic at 12.0.

## 2021-01-26 NOTE — Telephone Encounter (Signed)
Pt verified by name and DOB,  normal results given per provider, pt voiced understanding all question answered. °

## 2021-01-30 ENCOUNTER — Other Ambulatory Visit: Payer: Self-pay | Admitting: Internal Medicine

## 2021-01-30 DIAGNOSIS — I10 Essential (primary) hypertension: Secondary | ICD-10-CM

## 2021-01-30 MED ORDER — METOPROLOL SUCCINATE ER 25 MG PO TB24: 1.0000 | ORAL_TABLET | Freq: Every day | ORAL | 0 refills | Status: DC

## 2021-01-30 NOTE — Telephone Encounter (Signed)
Medication: metoprolol succinate (TOPROL-XL) 25 MG 24 hr tablet  Has the pt contacted their pharmacy? No  Preferred pharmacy: [id number] - [place], [address]  Please be advised refills may take up to 3 business days.  We ask that you follow up with your pharmacy  Pt has appt in June but will be out of this medication in May.

## 2021-02-14 ENCOUNTER — Other Ambulatory Visit: Payer: Self-pay | Admitting: Internal Medicine

## 2021-02-26 ENCOUNTER — Other Ambulatory Visit: Payer: Self-pay | Admitting: Internal Medicine

## 2021-02-26 NOTE — Telephone Encounter (Signed)
Requested Prescriptions  Pending Prescriptions Disp Refills  . memantine (NAMENDA) 10 MG tablet [Pharmacy Med Name: MEMANTINE HCL 10 MG TABLET] 180 tablet 0    Sig: TAKE 1 TABLET BY MOUTH TWICE A DAY     Neurology:  Alzheimer's Agents Failed - 02/26/2021 12:35 PM      Failed - Valid encounter within last 6 months    Recent Outpatient Visits          7 months ago Need for influenza vaccination   [place], Jarome Matin, RPH-CPP   7 months ago Essential hypertension   [employer], MD   11 months ago Essential hypertension   [place], Deborah B, MD   1 year ago Essential hypertension   [place], Deborah B, MD   1 year ago Essential hypertension   [place], MD      Future Appointments            In 1 month Wynetta Emery, Dalbert Batman, MD [place]

## 2021-03-30 ENCOUNTER — Other Ambulatory Visit: Payer: Self-pay | Admitting: Internal Medicine

## 2021-03-30 DIAGNOSIS — E785 Hyperlipidemia, unspecified: Secondary | ICD-10-CM

## 2021-03-30 NOTE — Telephone Encounter (Signed)
Requested Prescriptions  Pending Prescriptions Disp Refills  . simvastatin (ZOCOR) 20 MG tablet [Pharmacy Med Name: SIMVASTATIN 20 MG TABLET] 90 tablet 1    Sig: TAKE 1 TABLET BY MOUTH DAILY AT 6 PM.     Cardiovascular:  Antilipid - Statins Failed - 03/30/2021  1:45 AM      Failed - Total Cholesterol in normal range and within 360 days    Cholesterol, Total  Date Value Ref Range Status  09/11/2019 126 100 - 199 mg/dL Final         Failed - LDL in normal range and within 360 days    LDL Chol Calc (NIH)  Date Value Ref Range Status  09/11/2019 75 0 - 99 mg/dL Final         Failed - HDL in normal range and within 360 days    HDL  Date Value Ref Range Status  09/11/2019 30 (L) >39 mg/dL Final         Failed - Triglycerides in normal range and within 360 days    Triglycerides  Date Value Ref Range Status  09/11/2019 113 0 - 149 mg/dL Final         Passed - Patient is not pregnant      Passed - Valid encounter within last 12 months    Recent Outpatient Visits          8 months ago Need for influenza vaccination   [place], Jarome Matin, RPH-CPP   8 months ago Essential hypertension   [place], Deborah B, MD   1 year ago Essential hypertension   [place], Deborah B, MD   1 year ago Essential hypertension   [place], Deborah B, MD   1 year ago Essential hypertension   [place], MD      Future Appointments            In 4 days Ladell Pier, MD [place]

## 2021-04-03 ENCOUNTER — Other Ambulatory Visit: Payer: Self-pay

## 2021-04-03 ENCOUNTER — Ambulatory Visit: Payer: Medicare Other | Attending: Internal Medicine | Admitting: Internal Medicine

## 2021-04-03 ENCOUNTER — Encounter: Payer: Self-pay | Admitting: Internal Medicine

## 2021-04-03 VITALS — BP 117/65 | HR 85 | Resp 16 | Wt 159.0 lb

## 2021-04-03 DIAGNOSIS — I10 Essential (primary) hypertension: Secondary | ICD-10-CM

## 2021-04-03 DIAGNOSIS — Z23 Encounter for immunization: Secondary | ICD-10-CM

## 2021-04-03 DIAGNOSIS — Z86010 Personal history of colonic polyps: Secondary | ICD-10-CM

## 2021-04-03 DIAGNOSIS — Z125 Encounter for screening for malignant neoplasm of prostate: Secondary | ICD-10-CM

## 2021-04-03 DIAGNOSIS — R569 Unspecified convulsions: Secondary | ICD-10-CM | POA: Diagnosis not present

## 2021-04-03 DIAGNOSIS — E785 Hyperlipidemia, unspecified: Secondary | ICD-10-CM | POA: Diagnosis not present

## 2021-04-03 DIAGNOSIS — I69354 Hemiplegia and hemiparesis following cerebral infarction affecting left non-dominant side: Secondary | ICD-10-CM | POA: Diagnosis not present

## 2021-04-03 NOTE — Progress Notes (Signed)
Patient ID: TIGER SPIEKER, male    DOB: 06/19/1951  MRN: [id number]  CC: Hypertension   Subjective: [name] is a 70 y.o. male who presents for chronic ds management.  Sister, [name], is with him. His concerns today include: Pt with hx of hepatitis C treated, sz disorder, CVA with residual left-sided weakness uses a rolling walker, HTN, memory deficit (followed by neurology), tubular adenomas, PBC   PBC:  saw Pyrtle 10/2020.  Plan to continue Ursodiol.  Alkphos decreasing on it.  Over due for c-scope. History of colon polyps.  Sister wants to know if c-scope will be free.  No blood in stools.  HTN:  compliant with med Metoprolol and salt restriction.  No device to check BP.  No CP/SOB/LE edema/HA/dizziness  SZ/Hx of CVA with LT hemiparesis:  saw neurologist NP 01/2021.  No sz since last visit with me.  Compliant with Phenytoin.  No falls.  Uses Rollator walker for ambulation.  Eating and sleeping well  Sister request that he has prostate CA screening.  No hematuria.  No polyuria.  Patient Active Problem List   Diagnosis Date Noted   Bell's palsy 08/23/2020   Elevated alkaline phosphatase level 11/17/2019   Adenomatous polyp of colon 09/02/2017   Functional urinary incontinence 09/02/2017   Hemiparesis and alteration of sensations as late effects of stroke ([place]) 01/25/2017   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/23/2015   HTN (hypertension) 06/20/2015   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   Hip fracture ([place]) 11/26/2013   Seizure ([place]) 11/26/2013   Memory disorder 03/04/2013   Depressive disorder, not elsewhere classified 07/24/2012   Abnormality of gait 07/24/2012   Focal epilepsy with impairment of consciousness ([place]) 07/24/2012   Cerebral thrombosis with cerebral infarction ([place]) 07/24/2012   Lesion of ulnar nerve 07/24/2012     Current Outpatient Medications on File Prior to Visit  Medication Sig Dispense Refill   aspirin EC 81 MG tablet Take 1 tablet (81 mg total) by mouth daily.  100 tablet 0   levETIRAcetam (KEPPRA) 750 MG tablet Take 2 tablets (1,500 mg total) by mouth 2 (two) times daily. 360 tablet 3   memantine (NAMENDA) 10 MG tablet TAKE 1 TABLET BY MOUTH TWICE A DAY 180 tablet 0   metoprolol succinate (TOPROL-XL) 25 MG 24 hr tablet Take 1 tablet (25 mg total) by mouth daily. 90 tablet 0   phenytoin (DILANTIN) 100 MG ER capsule Take 2 capsules (200 mg total) by mouth at bedtime. 180 capsule 3   phenytoin (PHENYTOIN INFATABS) 50 MG tablet CHEW 0.5 TABLETS (25 MG TOTAL) BY MOUTH AT BEDTIME. 45 tablet 3   simvastatin (ZOCOR) 20 MG tablet TAKE 1 TABLET BY MOUTH DAILY AT 6 PM. 90 tablet 1   ursodiol (ACTIGALL) 500 MG tablet Take 1 tablet (500 mg total) by mouth in the morning and at bedtime. MUST KEEP 11/14/20 APPT FOR ANY ADDITIONAL REFILLS. 60 tablet 0   No current facility-administered medications on file prior to visit.    No Known Allergies  Social History   Socioeconomic History   Marital status: Divorced    Spouse name: Not on file   Number of children: 0   Years of education: 10   Highest education level: Not on file  Occupational History   Not on file  Tobacco Use   Smoking status: Former    Packs/day: 3.00    Pack years: 0.00    Types: Cigarettes    Quit date: 01/02/2014    Years  since quitting: 7.2   Smokeless tobacco: Former  Substance and Sexual Activity   Alcohol use: No    Alcohol/week: 0.0 standard drinks    Comment: Former Alcoholic   Drug use: No   Sexual activity: Yes  Other Topics Concern   Not on file  Social History Narrative   Patient is single and lives with a friend and her son.   Patient does not have any children.   Patient is disabled.   Patient has a 10 th grade education.   Patient is left-handed.   Patient drinks some soda but not everyday.   Social Determinants of Health   Financial Resource Strain: Not on file  Food Insecurity: Not on file  Transportation Needs: Not on file  Physical Activity: Not on file   Stress: Not on file  Social Connections: Not on file  Intimate Partner Violence: Not on file    Family History  Problem Relation Age of Onset   Pulmonary embolism Mother    Liver disease Father    Diabetes Sister    Hypertension Sister    Hypertension Sister    Hypertension Sister    Seizures Neg Hx     Past Surgical History:  Procedure Laterality Date   COLONOSCOPY WITH PROPOFOL N/A 07/10/2017   Procedure: COLONOSCOPY WITH PROPOFOL;  Surgeon: Otis Brace, MD;  Location: [facility];  Service: Gastroenterology;  Laterality: N/A;   ESOPHAGOGASTRODUODENOSCOPY (EGD) WITH PROPOFOL N/A 07/10/2017   Procedure: ESOPHAGOGASTRODUODENOSCOPY (EGD) WITH PROPOFOL;  Surgeon: Otis Brace, MD;  Location: [facility];  Service: Gastroenterology;  Laterality: N/A;   HIP ARTHROPLASTY Left 11/26/2013   Procedure: LEFT HIP HEMIARTHROPLASTY;  Surgeon: Mcarthur Rossetti, MD;  Location: [facility];  Service: Orthopedics;  Laterality: Left;   WRIST SURGERY Left 95 & 96    ROS: Review of Systems Negative except as stated above  PHYSICAL EXAM: BP 117/65   Pulse 85   Resp 16   Wt 159 lb (72.1 kg)   SpO2 95%   BMI 25.28 kg/m   Physical Exam  General appearance - alert, well appearing, and in no distress Mental status - pt answers questions appropriately Eyes - pink conjunctiva Mouth - mucous membranes moist, pharynx normal without lesions Chest - clear to auscultation, no wheezes, rales or rhonchi, symmetric air entry Heart - normal rate, regular rhythm, normal S1, S2, no murmurs, rubs, clicks or gallops Neurological - 4/5 strength in grip and pox/dist in LUE and LE.  Power  in RUE and RLE Extremities - no LE edema CMP Latest Ref Rng & Units 01/23/2021 11/14/2020 07/25/2020  Glucose 65 - 99 mg/dL 95 - 101(H)  BUN 8 - 27 mg/dL 11 - 12  Creatinine 0.76 - 1.27 mg/dL 0.90 - 0.90  Sodium 134 - 144 mmol/L 142 - 139  Potassium 3.5 - 5.2 mmol/L 4.8 - 4.1  Chloride 96 - 106 mmol/L 103 - 101   CO2 20 - 29 mmol/L 23 - -  Calcium 8.6 - 10.2 mg/dL 9.8 - -  Total Protein 6.0 - 8.5 g/dL 7.6 7.5 -  Total Bilirubin 0.0 - 1.2 mg/dL 0.4 0.2 -  Alkaline Phos 44 - 121 IU/L 185(H) 142(H) -  AST 0 - 40 IU/L 12 13 -  ALT 0 - 44 IU/L 12 7 -   Lipid Panel     Component Value Date/Time   CHOL 126 09/11/2019 1218   TRIG 113 09/11/2019 1218   HDL 30 (L) 09/11/2019 1218   CHOLHDL 4.2 09/11/2019  1218   CHOLHDL 2.5 05/29/2016 0943   VLDL 16 05/29/2016 0943   LDLCALC 75 09/11/2019 1218    CBC    Component Value Date/Time   WBC 5.4 01/23/2021 0955   WBC 8.6 11/14/2020 1626   RBC 4.74 01/23/2021 0955   RBC 4.86 11/14/2020 1626   HGB 15.6 01/23/2021 0955   HCT 45.5 01/23/2021 0955   PLT 171 01/23/2021 0955   MCV 96 01/23/2021 0955   MCH 32.9 01/23/2021 0955   MCH 33.3 07/25/2020 2138   MCHC 34.3 01/23/2021 0955   MCHC 35.9 11/14/2020 1626   RDW 12.5 01/23/2021 0955   LYMPHSABS 1.1 01/23/2021 0955   MONOABS 0.9 11/14/2020 1626   EOSABS 0.1 01/23/2021 0955   BASOSABS 0.0 01/23/2021 0955    ASSESSMENT AND PLAN: 1. Essential hypertension Controlled.  Continue Metoprolol and DASH  2. Seizure ([place]) Controlled on medication and following with neurology  3. History of colon polyps Advise pt's sister to call pt's insurance and let them know he is due for colon cancer screening with c-scope given hx of colon polyps.  Inquire what his co-pay if any would be.  4. Dyslipidemia Continue Zocor - Lipid panel  5. Hemiparesis of left nondominant side as late effect of cerebral infarction (HCC) Continue ASA and statin  6. Prostate cancer screening Pt agreeable to screening with PSA - PSA  7. Need for vaccination against Streptococcus pneumoniae - Pneumococcal polysaccharide vaccine 23-valent greater than or equal to 2yo subcutaneous/IM  Pt will get appt with clinical pharmacist in 1 mth for AWV. Can get first shingles vaccine at that time.  Patient was given the opportunity to ask  questions.  Patient verbalized understanding of the plan and was able to repeat key elements of the plan.   Orders Placed This Encounter  Procedures   Pneumococcal polysaccharide vaccine 23-valent greater than or equal to 2yo subcutaneous/IM   PSA   Lipid panel     Requested Prescriptions    No prescriptions requested or ordered in this encounter    Return in about 4 months (around 08/03/2021) for gIVE APPT WITH lUKE IN 1 MTH FOR MEDICARE WELLNESS VISIT.  Karle Plumber, MD, FACP

## 2021-04-03 NOTE — Patient Instructions (Signed)
Please call your insurance and let them know that you are overdue for colonoscopy for colon cancer screening.  You have a history of colon polyps.  Let them know you would like to know if you would have an out-of-pocket cost to have this done.

## 2021-04-04 LAB — LIPID PANEL
Chol/HDL Ratio: 4.1 ratio (ref 0.0–5.0)
Cholesterol, Total: 144 mg/dL (ref 100–199)
HDL: 35 mg/dL — ABNORMAL LOW (ref >39)
LDL Chol Calc (NIH): 93 mg/dL (ref 0–99)
Triglycerides: 85 mg/dL (ref 0–149)
VLDL Cholesterol Cal: 16 mg/dL (ref 5–40)

## 2021-04-04 LAB — PSA: Prostate Specific Ag, Serum: 1 ng/mL (ref 0.0–4.0)

## 2021-04-04 NOTE — Progress Notes (Signed)
Let patient know that his PSA level is normal.  This is the screening test for prostate cancer.  His LDL cholesterol is 93 with goal being less than 70.  I would like to change the simvastatin to a different cholesterol medication called atorvastatin that should work better.  Tell his sister to let me know once he is finished with the current bottle of simvastatin so that we can make the change.

## 2021-04-05 ENCOUNTER — Telehealth: Payer: Self-pay

## 2021-04-05 NOTE — Telephone Encounter (Signed)
Contacted pt to go over lab results pt didn't answer lvm  

## 2021-05-09 ENCOUNTER — Ambulatory Visit: Payer: Medicare Other

## 2021-05-10 ENCOUNTER — Other Ambulatory Visit: Payer: Self-pay | Admitting: Internal Medicine

## 2021-05-10 ENCOUNTER — Ambulatory Visit: Payer: Medicare Other | Admitting: Pharmacist

## 2021-05-10 DIAGNOSIS — I10 Essential (primary) hypertension: Secondary | ICD-10-CM

## 2021-05-14 ENCOUNTER — Other Ambulatory Visit: Payer: Self-pay | Admitting: Internal Medicine

## 2021-05-16 ENCOUNTER — Ambulatory Visit: Payer: Medicare Other

## 2021-05-24 ENCOUNTER — Other Ambulatory Visit: Payer: Self-pay | Admitting: Internal Medicine

## 2021-05-24 NOTE — Telephone Encounter (Signed)
Requested Prescriptions  Pending Prescriptions Disp Refills  . memantine (NAMENDA) 10 MG tablet [Pharmacy Med Name: MEMANTINE HCL 10 MG TABLET] 180 tablet 0    Sig: TAKE 1 TABLET BY MOUTH TWICE A DAY     Neurology:  Alzheimer's Agents Passed - 05/24/2021  1:33 AM      Passed - Valid encounter within last 6 months    Recent Outpatient Visits          1 month ago Essential hypertension   [place], MD   10 months ago Need for influenza vaccination   [place], RPH-CPP   10 months ago Essential hypertension   [place], Deborah B, MD   1 year ago Essential hypertension   [place], Deborah B, MD   1 year ago Essential hypertension   [place], MD      Future Appointments            In 2 months Wynetta Emery Dalbert Batman, MD [place]

## 2021-06-06 ENCOUNTER — Other Ambulatory Visit (INDEPENDENT_AMBULATORY_CARE_PROVIDER_SITE_OTHER): Payer: Medicare Other

## 2021-06-06 DIAGNOSIS — K743 Primary biliary cirrhosis: Secondary | ICD-10-CM

## 2021-06-06 LAB — HEPATIC FUNCTION PANEL
ALT: 7 U/L (ref 0–53)
AST: 11 U/L (ref 0–37)
Albumin: 4.4 g/dL (ref 3.5–5.2)
Alkaline Phosphatase: 144 U/L — ABNORMAL HIGH (ref 39–117)
Bilirubin, Direct: 0.1 mg/dL (ref 0.0–0.3)
Total Bilirubin: 0.4 mg/dL (ref 0.2–1.2)
Total Protein: 7.7 g/dL (ref 6.0–8.3)

## 2021-06-06 LAB — CBC WITH DIFFERENTIAL/PLATELET
Basophils Absolute: 0 10*3/uL (ref 0.0–0.1)
Basophils Relative: 0.5 % (ref 0.0–3.0)
Eosinophils Absolute: 0.2 10*3/uL (ref 0.0–0.7)
Eosinophils Relative: 3.4 % (ref 0.0–5.0)
HCT: 42.4 % (ref 39.0–52.0)
Hemoglobin: 14.7 g/dL (ref 13.0–17.0)
Lymphocytes Relative: 26.2 % (ref 12.0–46.0)
Lymphs Abs: 1.3 10*3/uL (ref 0.7–4.0)
MCHC: 34.6 g/dL (ref 30.0–36.0)
MCV: 95.7 fl (ref 78.0–100.0)
Monocytes Absolute: 0.6 10*3/uL (ref 0.1–1.0)
Monocytes Relative: 11.6 % (ref 3.0–12.0)
Neutro Abs: 2.9 10*3/uL (ref 1.4–7.7)
Neutrophils Relative %: 58.3 % (ref 43.0–77.0)
Platelets: 185 10*3/uL (ref 150.0–400.0)
RBC: 4.42 Mil/uL (ref 4.22–5.81)
RDW: 12.6 % (ref 11.5–15.5)
WBC: 4.9 10*3/uL (ref 4.0–10.5)

## 2021-06-06 LAB — PROTIME-INR
INR: 1.1 ratio — ABNORMAL HIGH (ref 0.8–1.0)
Prothrombin Time: 12.4 s (ref 9.6–13.1)

## 2021-06-07 ENCOUNTER — Other Ambulatory Visit: Payer: Self-pay

## 2021-06-07 DIAGNOSIS — K743 Primary biliary cirrhosis: Secondary | ICD-10-CM

## 2021-06-08 ENCOUNTER — Encounter: Payer: Self-pay | Admitting: *Deleted

## 2021-06-12 ENCOUNTER — Ambulatory Visit: Payer: Medicare Other | Admitting: Internal Medicine

## 2021-06-14 ENCOUNTER — Other Ambulatory Visit: Payer: Self-pay | Admitting: Internal Medicine

## 2021-06-21 ENCOUNTER — Other Ambulatory Visit: Payer: Self-pay | Admitting: Internal Medicine

## 2021-06-29 ENCOUNTER — Ambulatory Visit: Payer: Medicare Other | Attending: Internal Medicine | Admitting: Pharmacist

## 2021-06-29 ENCOUNTER — Encounter: Payer: Self-pay | Admitting: Pharmacist

## 2021-06-29 ENCOUNTER — Other Ambulatory Visit: Payer: Self-pay

## 2021-06-29 VITALS — BP 110/67 | HR 75 | Temp 98.6°F | Ht 68.0 in | Wt 161.0 lb

## 2021-06-29 DIAGNOSIS — Z23 Encounter for immunization: Secondary | ICD-10-CM | POA: Diagnosis not present

## 2021-06-29 DIAGNOSIS — Z0000 Encounter for general adult medical examination without abnormal findings: Secondary | ICD-10-CM

## 2021-06-29 NOTE — Progress Notes (Signed)
Subjective:   [name] is a 70 y.o. male who presents for Medicare Annual/Subsequent preventive examination.  Review of Systems    Cardiac Risk Factors include: advanced age (>62mn, >>55women);male gender;sedentary lifestyle     Objective:    Today's Vitals   06/29/21 1054 06/29/21 1534  BP: 110/67   Pulse: 75   Temp: 98.6 F (37 C)   SpO2: 94%   Weight: 161 lb (73 kg)   Height: '5\' 8"'$  (1.727 m)   PainSc: 0-No pain 0-No pain   Body mass index is 24.48 kg/m.  Advanced Directives 06/29/2021 07/26/2020 07/10/2017 03/13/2017 05/23/2016 01/18/2016 01/07/2015  Does Patient Have a Medical Advance Directive? No No No No No No No  Would patient like information on creating a medical advance directive? No - Patient declined - No - Patient declined No - Patient declined No - patient declined information No - patient declined information Yes - Educational materials given    Current Medications (verified) Outpatient Encounter Medications as of 06/29/2021  Medication Sig   aspirin EC 81 MG tablet Take 1 tablet (81 mg total) by mouth daily.   levETIRAcetam (KEPPRA) 750 MG tablet Take 2 tablets (1,500 mg total) by mouth 2 (two) times daily.   memantine (NAMENDA) 10 MG tablet TAKE 1 TABLET BY MOUTH TWICE A DAY   metoprolol succinate (TOPROL-XL) 25 MG 24 hr tablet TAKE 1 TABLET (25 MG TOTAL) BY MOUTH DAILY.   phenytoin (DILANTIN) 100 MG ER capsule Take 2 capsules (200 mg total) by mouth at bedtime.   phenytoin (PHENYTOIN INFATABS) 50 MG tablet CHEW 0.5 TABLETS (25 MG TOTAL) BY MOUTH AT BEDTIME.   simvastatin (ZOCOR) 20 MG tablet TAKE 1 TABLET BY MOUTH DAILY AT 6 PM.   ursodiol (ACTIGALL) 250 MG tablet TAKE 2 TABLETS (500 MG TOTAL) BY MOUTH IN THE MORNING AND AT BEDTIME. NEEDS ENDO/COLON FOR ADDITIONAL REFILLS   ursodiol (ACTIGALL) 500 MG tablet Take 1 tablet (500 mg total) by mouth in the morning and at bedtime. MUST KEEP 11/14/20 APPT FOR ANY ADDITIONAL REFILLS.   No facility-administered  encounter medications on file as of 06/29/2021.    Allergies (verified) Patient has no known allergies.   History: Past Medical History:  Diagnosis Date   Al[place] ([place]    History of, not active   Chronic hepatitis C ([place]    Dementia ([place]    Diverticulosis    Gait disorder    Gingival hypertrophy    Secondary to Dilantin   Hemiparesis and alteration of sensations as late effects of stroke ([place] 01/25/2017   Left hemiparesis   Hiatal hernia    [place]    Hx of ischemic vertebrobasilar artery thalamic stroke    Right   Internal hemorrhoids    Memory disorder 03/04/2013   Primary biliary cirrhosis (HCC)    Seizures (HCC)    Stroke ([facility]    Right frontal, right thalamic   Tubular adenoma of colon    Ulnar neuropathy of left upper extremity    Past Surgical History:  Procedure Laterality Date   COLONOSCOPY WITH PROPOFOL N/A 07/10/2017   Procedure: COLONOSCOPY WITH PROPOFOL;  Surgeon: BOtis Brace MD;  [facility];  Service: Gastroenterology;  Laterality: N/A;   ESOPHAGOGASTRODUODENOSCOPY (EGD) WITH PROPOFOL N/A 07/10/2017   Procedure: ESOPHAGOGASTRODUODENOSCOPY (EGD) WITH PROPOFOL;  Surgeon: BOtis Brace MD;  [place] [facility]  Service: Gastroenterology;  Laterality: N/A;   HIP ARTHROPLASTY Left 11/26/2013   Procedure: LEFT HIP HEMIARTHROPLASTY;  Surgeon: Mcarthur Rossetti, MD;  Location: [place];  Service: Orthopedics;  Laterality: Left;   WRIST SURGERY Left 95 & 90   Family History  Problem Relation Age of Onset   Pulmonary embolism Mother    Liver disease Father    Diabetes Sister    Hypertension Sister    Hypertension Sister    Hypertension Sister    Seizures Neg Hx    Social History   Socioeconomic History   Marital status: Divorced    Spouse name: Not on file   Number of children: 0   Years of education: 10   Highest education level: Not on file  Occupational History   Not on file  Tobacco Use   Smoking  status: Former    Packs/day: 3.00    Types: Cigarettes    Quit date: 01/02/2014    Years since quitting: 7.4   Smokeless tobacco: Former  Substance and Sexual Activity   Alcohol use: No    Alcohol/week: 0.0 standard drinks    Comment: Former Alcoholic   Drug use: No   Sexual activity: Yes  Other Topics Concern   Not on file  Social History Narrative   Patient is single and lives with a friend and her son.   Patient does not have any children.   Patient is disabled.   Patient has a 10 th grade education.   Patient is left-handed.   Patient drinks some soda but not everyday.   Social Determinants of Health   Financial Resource Strain: Not on file  Food Insecurity: Not on file  Transportation Needs: Not on file  Physical Activity: Not on file  Stress: Not on file  Social Connections: Not on file    Tobacco Counseling Counseling given: No   Clinical Intake:  Pre-visit preparation completed: No  Pain : No/denies pain Pain Score: 0-No pain     BMI - recorded: 24.48 Nutritional Status: BMI of 19-24  Normal Diabetes: No  How often do you need to have someone help you when you read instructions, pamphlets, or other written materials from your doctor or pharmacy?: 5 - Always  Diabetic? No  Interpreter Needed?: No      Activities of Daily Living In your present state of health, do you have any difficulty performing the following activities: 06/29/2021  Hearing? N  Vision? N  Difficulty concentrating or making decisions? Y  Comment No change from baseline per sister  Walking or climbing stairs? Y  Dressing or bathing? Y  Comment Requires assistance  Doing errands, shopping? Y  Preparing Food and eating ? Y  Using the Toilet? Y  In the past six months, have you accidently leaked urine? N  Do you have problems with loss of bowel control? N  Managing your Medications? Y  Managing your Finances? Y  Housekeeping or managing your Housekeeping? Y  Some recent data  might be hidden    Patient Care Team: Ladell Pier, MD as PCP - General (Internal Medicine)  Indicate any recent Medical Services you may have received from other than [facility] providers in the past year (date may be approximate).     Assessment:   This is a routine wellness examination for [place].  Hearing/Vision screen No results found.  Dietary issues and exercise activities discussed: Current Exercise Habits: The patient does not participate in regular exercise at present, Exercise limited by: neurologic condition(s);orthopedic condition(s)   Goals Addressed   None   Depression Screen PHQ 2/9 Scores 06/29/2021  04/03/2021 07/07/2020 02/04/2019 12/30/2018 07/10/2018 03/04/2018  PHQ - 2 Score 0 0 0 0 0 1 0  PHQ- 9 Score - - - 1 - - -    Fall Risk Fall Risk  06/29/2021 04/03/2021 07/07/2020 03/04/2020 02/04/2019  Falls in the past year? 0 0 0 0 0  Number falls in past yr: 0 0 0 - -  Injury with Fall? 0 0 0 - -  Risk Factor Category  - - - - -  Risk for fall due to : Impaired balance/gait;Impaired mobility;Impaired vision;Mental status change No Fall Risks - - -  Follow up Falls evaluation completed;Education provided;Falls prevention discussed - - - -    FALL RISK PREVENTION PERTAINING TO THE HOME:  Any stairs in or around the home? No  If so, are there any without handrails? No  Home free of loose throw rugs in walkways, pet beds, electrical cords, etc? Yes  Adequate lighting in your home to reduce risk of falls? Yes   ASSISTIVE DEVICES UTILIZED TO PREVENT FALLS:  Life alert? No  Use of a cane, walker or w/c? Yes  Grab bars in the bathroom? No  Shower chair or bench in shower? No  Elevated toilet seat or a handicapped toilet? No   TIMED UP AND GO:  Was the test performed? Yes .  Length of time to ambulate 10 feet: 15 sec.   Gait slow and steady with assistive device  Cognitive Function: MMSE - Mini Mental State Exam 06/29/2021 08/18/2019 01/27/2018 09/28/2015 07/20/2015   Orientation to time 2 0 0 0 2  Orientation to Place '2 3 2 3 5  '$ Registration '2 3 3 3 3  '$ Attention/ Calculation 3 0 0 0 0  Recall 0 0 0 0 0  Language- name 2 objects '2 2 2 2 2  '$ Language- repeat 1 0 1 1 0  Language- follow 3 step command '3 2 3 3 3  '$ Language- read & follow direction 0 0 0 0 0  Write a sentence 0 0 0 0 0  Copy design 0 0 0 0 0  Total score '15 10 11 12 15        '$ Immunizations Immunization History  Administered Date(s) Administered   Influenza,inj,Quad PF,6+ Mos 11/28/2013, 09/02/2017, 07/10/2018, 07/02/2019, 07/07/2020, 06/29/2021   Moderna SARS-COV2 Booster Vaccination 09/13/2020   Moderna Sars-Covid-2 Vaccination 12/14/2019, 01/12/2020   Pneumococcal Conjugate-13 07/10/2018   Pneumococcal Polysaccharide-23 09/22/2015, 04/03/2021   Tdap 05/23/2016    TDAP status: Up to date  Flu Vaccine status: Completed at today's visit  Pneumococcal vaccine status: Up to date  Covid-19 vaccine status: Information provided on how to obtain vaccines.   Qualifies for Shingles Vaccine? Yes   Zostavax completed No   Shingrix Completed?: No.    Education has been provided regarding the importance of this vaccine. Patient has been advised to call insurance company to determine out of pocket expense if they have not yet received this vaccine. Advised may also receive vaccine at local pharmacy or Health Dept. Verbalized acceptance and understanding.  Screening Tests Health Maintenance  Topic Date Due   Zoster Vaccines- Shingrix (1 of 2) Never done   COVID-19 Vaccine (4 - Booster for Moderna series) 01/11/2021   TETANUS/TDAP  05/23/2026   COLONOSCOPY (Pts 45-73yr Insurance coverage will need to be confirmed)  07/11/2027   INFLUENZA VACCINE  Completed   Hepatitis C Screening  Completed   PNA vac Low Risk Adult  Completed   HPV VACCINES  Aged Out  Health Maintenance  Health Maintenance Due  Topic Date Due   Zoster Vaccines- Shingrix (1 of 2) Never done   COVID-19  Vaccine (4 - Booster for Moderna series) 01/11/2021    Colorectal cancer screening: Type of screening: Colonoscopy. Completed 07/10/2017. Repeat every 10 years  Lung Cancer Screening: (Low Dose CT Chest recommended if Age 40-80 years, 30 pack-year currently smoking OR have quit w/in 15years.) does qualify.   Lung Cancer Screening Referral: Declines at this time.   Additional Screening:  Hepatitis C Screening: does qualify; Completed 12/22/2019  Vision Screening: Recommended annual ophthalmology exams for early detection of glaucoma and other disorders of the eye. Is the patient up to date with their annual eye exam?  No  Who is the provider or what is the name of the office in which the patient attends annual eye exams? None If pt is not established with a provider, would they like to be referred to a provider to establish care? Yes .   Dental Screening: Recommended annual dental exams for proper oral hygiene  Community Resource Referral / Chronic Care Management: CRR required this visit?  No   CCM required this visit?  No      Plan:     I have personally reviewed and noted the following in the patient's chart:   Medical and social history Use of alcohol, tobacco or illicit drugs  Current medications and supplements including opioid prescriptions. Patient is not currently taking opioid prescriptions. Functional ability and status Nutritional status Physical activity Advanced directives List of other physicians Hospitalizations, surgeries, and ER visits in previous 12 months Vitals Screenings to include cognitive, depression, and falls Referrals and appointments  In addition, I have reviewed and discussed with patient certain preventive protocols, quality metrics, and best practice recommendations. A written personalized care plan for preventive services as well as general preventive health recommendations were provided to patient.     Tresa Endo,  RPH-CPP   06/29/2021

## 2021-07-05 ENCOUNTER — Telehealth: Payer: Self-pay | Admitting: Internal Medicine

## 2021-07-05 DIAGNOSIS — Z0100 Encounter for examination of eyes and vision without abnormal findings: Secondary | ICD-10-CM

## 2021-07-05 DIAGNOSIS — I69354 Hemiplegia and hemiparesis following cerebral infarction affecting left non-dominant side: Secondary | ICD-10-CM

## 2021-07-05 NOTE — Telephone Encounter (Signed)
-----   Message from Tresa Endo, RPH-CPP sent at 06/29/2021  3:47 PM EDT ----- Saw pt for his AWV today and found the following:   1. Fluarix given. Pt declined Shingrix today. Wishes to receive at upcoming visit with you.  2. Instructed him (his sister) to pursue 2nd [place] booster. 3. Sister requested Ophthalmology referral.  4. MMSE quite low. Cognitive impairment managed with Neurology.  5. ADLs are assisted at best and some dependent. His sister requests rx for bedside toilet and shower chair.    Thanks for including me.    Lurena Joiner

## 2021-07-06 NOTE — Telephone Encounter (Signed)
[facility] received the orders but needs OV notes from within the last 6 months / please fax to [phone]

## 2021-07-06 NOTE — Telephone Encounter (Signed)
Rx's has been sent to [facility]

## 2021-07-07 NOTE — Telephone Encounter (Signed)
Ple 

## 2021-07-07 NOTE — Telephone Encounter (Signed)
Notes has been faxed.

## 2021-07-15 ENCOUNTER — Other Ambulatory Visit: Payer: Self-pay | Admitting: Internal Medicine

## 2021-08-03 ENCOUNTER — Ambulatory Visit: Payer: Medicare Other | Admitting: Internal Medicine

## 2021-08-17 ENCOUNTER — Ambulatory Visit: Payer: Medicare Other | Admitting: Internal Medicine

## 2021-08-17 ENCOUNTER — Other Ambulatory Visit: Payer: Self-pay | Admitting: Internal Medicine

## 2021-08-17 DIAGNOSIS — I10 Essential (primary) hypertension: Secondary | ICD-10-CM

## 2021-08-17 NOTE — Telephone Encounter (Signed)
Requested Prescriptions  Pending Prescriptions Disp Refills  . metoprolol succinate (TOPROL-XL) 25 MG 24 hr tablet [Pharmacy Med Name: METOPROLOL SUCC ER 25 MG TAB] 90 tablet 0    Sig: TAKE 1 TABLET (25 MG TOTAL) BY MOUTH DAILY.     Cardiovascular:  Beta Blockers Passed - 08/17/2021  1:33 AM      Passed - Last BP in normal range    BP Readings from Last 1 Encounters:  06/29/21 110/67         Passed - Last Heart Rate in normal range    Pulse Readings from Last 1 Encounters:  06/29/21 75         Passed - Valid encounter within last 6 months    Recent Outpatient Visits          1 month ago Need for immunization against influenza   [place], RPH-CPP   4 months ago Essential hypertension   [place] Ladell Pier, MD   1 year ago Need for influenza vaccination   [place] Daisy Blossom, Jarome Matin, RPH-CPP   1 year ago Essential hypertension   [place] Ladell Pier, MD   1 year ago Essential hypertension   [place] Ladell Pier, MD

## 2021-08-18 ENCOUNTER — Telehealth: Payer: Self-pay

## 2021-08-18 NOTE — Telephone Encounter (Signed)
Request from [facility] for increase in PCS hours  Call placed to number on file for patient's sister, [name] to inquire why an increase is needed,  The person who answered said it is the wrong number.  Call placed to patient and his nephew, [name] answered and explained that [name] is his mother and she had been looking after patient until a few weeks ago and he is now staying with a cousin. [name] said that his mother did a lot for the patient and he is not sure why the request for increasing hours was made.  He said he will have his mother call this CM back with more information.

## 2021-09-07 ENCOUNTER — Other Ambulatory Visit: Payer: Self-pay | Admitting: Internal Medicine

## 2021-09-07 NOTE — Telephone Encounter (Signed)
Requested Prescriptions  Pending Prescriptions Disp Refills  . memantine (NAMENDA) 10 MG tablet [Pharmacy Med Name: MEMANTINE HCL 10 MG TABLET] 180 tablet 0    Sig: TAKE 1 TABLET BY MOUTH TWICE A DAY     Neurology:  Alzheimer's Agents Passed - 09/07/2021  2:31 PM      Passed - Valid encounter within last 6 months    Recent Outpatient Visits          2 months ago Need for immunization against influenza   [place], RPH-CPP   5 months ago Essential hypertension   [place] Ladell Pier, MD   1 year ago Need for influenza vaccination   [place] Daisy Blossom, Jarome Matin, RPH-CPP   1 year ago Essential hypertension   [place] Ladell Pier, MD   1 year ago Essential hypertension   [place] Ladell Pier, MD

## 2021-09-27 ENCOUNTER — Ambulatory Visit: Payer: Medicare Other | Admitting: Physician Assistant

## 2021-10-25 ENCOUNTER — Other Ambulatory Visit: Payer: Self-pay | Admitting: Internal Medicine

## 2021-10-26 ENCOUNTER — Other Ambulatory Visit: Payer: Self-pay

## 2021-10-26 ENCOUNTER — Encounter: Payer: Self-pay | Admitting: Physician Assistant

## 2021-10-26 ENCOUNTER — Ambulatory Visit: Payer: Medicare Other | Attending: Physician Assistant | Admitting: Physician Assistant

## 2021-10-26 VITALS — BP 105/68 | HR 79 | Temp 99.2°F | Ht 68.0 in | Wt 147.2 lb

## 2021-10-26 DIAGNOSIS — R413 Other amnesia: Secondary | ICD-10-CM | POA: Diagnosis not present

## 2021-10-26 DIAGNOSIS — Z79899 Other long term (current) drug therapy: Secondary | ICD-10-CM

## 2021-10-26 DIAGNOSIS — I10 Essential (primary) hypertension: Secondary | ICD-10-CM | POA: Diagnosis not present

## 2021-10-26 DIAGNOSIS — E785 Hyperlipidemia, unspecified: Secondary | ICD-10-CM | POA: Diagnosis not present

## 2021-10-26 DIAGNOSIS — I69398 Other sequelae of cerebral infarction: Secondary | ICD-10-CM | POA: Diagnosis not present

## 2021-10-26 DIAGNOSIS — R569 Unspecified convulsions: Secondary | ICD-10-CM | POA: Diagnosis not present

## 2021-10-26 DIAGNOSIS — G4700 Insomnia, unspecified: Secondary | ICD-10-CM

## 2021-10-26 DIAGNOSIS — I69359 Hemiplegia and hemiparesis following cerebral infarction affecting unspecified side: Secondary | ICD-10-CM | POA: Diagnosis not present

## 2021-10-26 MED ORDER — SIMVASTATIN 20 MG PO TABS: ORAL_TABLET | ORAL | 1 refills | Status: DC

## 2021-10-26 MED ORDER — METOPROLOL SUCCINATE ER 25 MG PO TB24: 25.0000 mg | ORAL_TABLET | Freq: Every day | ORAL | 1 refills | Status: DC

## 2021-10-26 NOTE — Progress Notes (Signed)
Cramping in left leg

## 2021-10-26 NOTE — Progress Notes (Signed)
Patient ID: [name], male   DOB: 06/25/1951, 71 y.o.   MRN: [id number]   Rhys Lichty, is a 71 y.o. male  [id number]  [id number]  DOB - 12/25/1950  Chief Complaint  Patient presents with   Hypertension       Subjective:   [name] is a 71 y.o. male here today for medication review.  His sister brought him today.  She is unsure what his medications are for and he is staying with her currently.    He is unable to perform many ADL- he can not shave.  He can bathe but not well bc of previous stroke. He has trouble dressing and getting food/groceries  Lately he has been more combative and disagreeable than usual.  He has been "wandering off."  He has cursed people out and other things that have not been in his nature.  He is not due for neurology until April.     Patient has No headache, No chest pain, No abdominal pain - No Nausea, No new weakness tingling or numbness, No Cough - SOB.  No problems updated.  ALLERGIES: No Known Allergies  PAST MEDICAL HISTORY: Past Medical History:  Diagnosis Date   Alcoholism ([place])    History of, not active   Chronic hepatitis C ([place])    Dementia ([place])    Diverticulosis    Gait disorder    Gingival hypertrophy    Secondary to Dilantin   Hemiparesis and alteration of sensations as late effects of stroke ([place]) 01/25/2017   Left hemiparesis   Hiatal hernia    History of alcoholism ([place])    Hx of ischemic vertebrobasilar artery thalamic stroke    Right   Internal hemorrhoids    Memory disorder 03/04/2013   Primary biliary cirrhosis (HCC)    Seizures (HCC)    Stroke ([place])    Right frontal, right thalamic   Tubular adenoma of colon    Ulnar neuropathy of left upper extremity     MEDICATIONS AT HOME: Prior to Admission medications   Medication Sig Start Date End Date Taking? Authorizing Provider  aspirin EC 81 MG tablet Take 1 tablet (81 mg total) by mouth daily. 03/04/20  Yes Ladell Pier, MD  levETIRAcetam  (KEPPRA) 750 MG tablet Take 2 tablets (1,500 mg total) by mouth 2 (two) times daily. 01/23/21  Yes Suzzanne Cloud, NP  memantine (NAMENDA) 10 MG tablet TAKE 1 TABLET BY MOUTH TWICE A DAY 09/07/21  Yes Ladell Pier, MD  phenytoin (DILANTIN) 100 MG ER capsule Take 2 capsules (200 mg total) by mouth at bedtime. 01/23/21  Yes Suzzanne Cloud, NP  phenytoin (PHENYTOIN INFATABS) 50 MG tablet CHEW 0.5 TABLETS (25 MG TOTAL) BY MOUTH AT BEDTIME. 01/23/21  Yes Suzzanne Cloud, NP  ursodiol (ACTIGALL) 250 MG tablet TAKE 2 TABLETS BY MOUTH IN THE MORNING AND AT BEDTIME. NEEDS ENDO/COLON FOR ADDITIONAL REFILLS 07/17/21  Yes Pyrtle, Lajuan Lines, MD  ursodiol (ACTIGALL) 500 MG tablet Take 1 tablet (500 mg total) by mouth in the morning and at bedtime. MUST KEEP 11/14/20 APPT FOR ANY ADDITIONAL REFILLS. 10/19/20  Yes Pyrtle, Lajuan Lines, MD  metoprolol succinate (TOPROL-XL) 25 MG 24 hr tablet Take 1 tablet (25 mg total) by mouth daily. 10/26/21   Argentina Donovan, PA-C  simvastatin (ZOCOR) 20 MG tablet TAKE 1 TABLET BY MOUTH DAILY AT 6 PM. 10/26/21   Chaney Ingram, Dionne Bucy, PA-C    ROS: Neg HEENT Neg resp Neg cardiac Neg  GI Neg GU Neg MS  Objective:   Vitals:   10/26/21 0911  BP: 105/68  Pulse: 79  Temp: 99.2 F (37.3 C)  SpO2: 99%  Weight: 147 lb 4 oz (66.8 kg)  Height: 5\' 8"  (1.727 m)   Exam General appearance : Awake, alert, not in any distress. Sister does most of the talking but patient nods in agreement HEENT: Atraumatic and Normocephalic Neck: Supple, no JVD. No cervical lymphadenopathy.  Chest: Good air entry bilaterally, CTAB.  No rales/rhonchi/wheezing CVS: S1 S2 regular, no murmurs.  Extremities: B/L Lower Ext shows no edema, both legs are warm to touch Neurology: Awake alert, and oriented  Skin: No Rash  Data Review Lab Results  Component Value Date   HGBA1C 5.3 05/23/2016    Assessment & Plan   1. Dyslipidemia - simvastatin (ZOCOR) 20 MG tablet; TAKE 1 TABLET BY MOUTH DAILY AT 6 PM.  Dispense:  90 tablet; Refill: 1  2. Essential hypertension controlled - metoprolol succinate (TOPROL-XL) 25 MG 24 hr tablet; Take 1 tablet (25 mg total) by mouth daily.  Dispense: 90 tablet; Refill: 1 - CBC with Differential/Platelet - Comprehensive metabolic panel - Thyroid Panel With TSH  3. Seizure (HCC) On phenytoin and keppra - Ambulatory referral to Social Work  4. Memory disorder On namenda - Ambulatory referral to Neurology - Ambulatory referral to Social Work - Comprehensive metabolic panel - Thyroid Panel With TSH  5. Hemiparesis and alteration of sensations as late effects of stroke [facility]) - Ambulatory referral to Social Work  6. Encounter for medication review All meds reviewed.  Meds that did not have RF were filled.  Others had RF  7. Insomnia, unspecified type Can try melatonin - Thyroid Panel With TSH    Patient have been counseled extensively about nutrition and exercise. Other issues discussed during this visit include: low cholesterol diet, weight control and daily exercise, foot care, annual eye examinations at Ophthalmology, importance of adherence with medications and regular follow-up. We also discussed long term complications of uncontrolled diabetes and hypertension.   Return in about 3 months (around 01/24/2022) for PCP for chronic conditions.  The patient was given clear instructions to go to ER or return to medical center if symptoms don't improve, worsen or new problems develop. The patient verbalized understanding. The patient was told to call to get lab results if they haven't heard anything in the next week.      Freeman Caldron, PA-C [facility] and [facility] [place], [place]   10/26/2021, 9:49 AM

## 2021-10-26 NOTE — Patient Instructions (Signed)
For now, try melatonin 3 to 6 mg 1 hour before bedtime  Insomnia Insomnia is a sleep disorder that makes it difficult to fall asleep or stay asleep. Insomnia can cause fatigue, low energy, difficulty concentrating, mood swings, and poor performance at work or school. There are three different ways to classify insomnia: Difficulty falling asleep. Difficulty staying asleep. Waking up too early in the morning. Any type of insomnia can be long-term (chronic) or short-term (acute). Both are common. Short-term insomnia usually lasts for three months or less. Chronic insomnia occurs at least three times a week for longer than three months. What are the causes? Insomnia may be caused by another condition, situation, or substance, such as: Anxiety. Certain medicines. Gastroesophageal reflux disease (GERD) or other gastrointestinal conditions. Asthma or other breathing conditions. Restless legs syndrome, sleep apnea, or other sleep disorders. Chronic pain. Menopause. Stroke. Abuse of alcohol, tobacco, or illegal drugs. Mental health conditions, such as depression. Caffeine. Neurological disorders, such as Alzheimer's disease. An overactive thyroid (hyperthyroidism). Sometimes, the cause of insomnia may not be known. What increases the risk? Risk factors for insomnia include: Gender. Women are affected more often than men. Age. Insomnia is more common as you get older. Stress. Lack of exercise. Irregular work schedule or working night shifts. Traveling between different time zones. Certain medical and mental health conditions. What are the signs or symptoms? If you have insomnia, the main symptom is having trouble falling asleep or having trouble staying asleep. This may lead to other symptoms, such as: Feeling fatigued or having low energy. Feeling nervous about going to sleep. Not feeling rested in the morning. Having trouble concentrating. Feeling irritable, anxious, or  depressed. How is this diagnosed? This condition may be diagnosed based on: Your symptoms and medical history. Your health care provider may ask about: Your sleep habits. Any medical conditions you have. Your mental health. A physical exam. How is this treated? Treatment for insomnia depends on the cause. Treatment may focus on treating an underlying condition that is causing insomnia. Treatment may also include: Medicines to help you sleep. Counseling or therapy. Lifestyle adjustments to help you sleep better. Follow these instructions at home: Eating and drinking  Limit or avoid alcohol, caffeinated beverages, and cigarettes, especially close to bedtime. These can disrupt your sleep. Do not eat a large meal or eat spicy foods right before bedtime. This can lead to digestive discomfort that can make it hard for you to sleep. Sleep habits  Keep a sleep diary to help you and your health care provider figure out what could be causing your insomnia. Write down: When you sleep. When you wake up during the night. How well you sleep. How rested you feel the next day. Any side effects of medicines you are taking. What you eat and drink. Make your bedroom a dark, comfortable place where it is easy to fall asleep. Put up shades or blackout curtains to block light from outside. Use a white noise machine to block noise. Keep the temperature cool. Limit screen use before bedtime. This includes: Watching TV. Using your smartphone, tablet, or computer. Stick to a routine that includes going to bed and waking up at the same times every day and night. This can help you fall asleep faster. Consider making a quiet activity, such as reading, part of your nighttime routine. Try to avoid taking naps during the day so that you sleep better at night. Get out of bed if you are still awake after 15 minutes of  trying to sleep. Keep the lights down, but try reading or doing a quiet activity. When you feel  sleepy, go back to bed. General instructions Take over-the-counter and prescription medicines only as told by your health care provider. Exercise regularly, as told by your health care provider. Avoid exercise starting several hours before bedtime. Use relaxation techniques to manage stress. Ask your health care provider to suggest some techniques that may work well for you. These may include: Breathing exercises. Routines to release muscle tension. Visualizing peaceful scenes. Make sure that you drive carefully. Avoid driving if you feel very sleepy. Keep all follow-up visits as told by your health care provider. This is important. Contact a health care provider if: You are tired throughout the day. You have trouble in your daily routine due to sleepiness. You continue to have sleep problems, or your sleep problems get worse. Get help right away if: You have serious thoughts about hurting yourself or someone else. If you ever feel like you may hurt yourself or others, or have thoughts about taking your own life, get help right away. You can go to your nearest emergency department or call: Your local emergency services (911 in the [place] A suicide crisis helpline, such as the [facility] at 224-018-4801 or 988 in the [place]. This is open 24 hours a day. Summary Insomnia is a sleep disorder that makes it difficult to fall asleep or stay asleep. Insomnia can be long-term (chronic) or short-term (acute). Treatment for insomnia depends on the cause. Treatment may focus on treating an underlying condition that is causing insomnia. Keep a sleep diary to help you and your health care provider figure out what could be causing your insomnia. This information is not intended to replace advice given to you by your health care provider. Make sure you discuss any questions you have with your health care provider. Document Revised: 05/03/2021 Document Reviewed: 08/18/2020 Elsevier  Patient Education  2022 [employer].

## 2021-10-27 ENCOUNTER — Other Ambulatory Visit: Payer: Self-pay | Admitting: Physician Assistant

## 2021-10-27 DIAGNOSIS — R748 Abnormal levels of other serum enzymes: Secondary | ICD-10-CM

## 2021-10-27 LAB — COMPREHENSIVE METABOLIC PANEL
ALT: 36 IU/L (ref 0–44)
AST: 28 IU/L (ref 0–40)
Albumin/Globulin Ratio: 1.7 (ref 1.2–2.2)
Albumin: 4.7 g/dL (ref 3.8–4.8)
Alkaline Phosphatase: 166 IU/L — ABNORMAL HIGH (ref 44–121)
BUN/Creatinine Ratio: 11 (ref 10–24)
BUN: 10 mg/dL (ref 8–27)
Bilirubin Total: 0.4 mg/dL (ref 0.0–1.2)
CO2: 28 mmol/L (ref 20–29)
Calcium: 9.8 mg/dL (ref 8.6–10.2)
Chloride: 98 mmol/L (ref 96–106)
Creatinine, Ser: 0.91 mg/dL (ref 0.76–1.27)
Globulin, Total: 2.7 g/dL (ref 1.5–4.5)
Glucose: 82 mg/dL (ref 70–99)
Potassium: 5 mmol/L (ref 3.5–5.2)
Sodium: 137 mmol/L (ref 134–144)
Total Protein: 7.4 g/dL (ref 6.0–8.5)
eGFR: 91 mL/min/{1.73_m2} (ref >59)

## 2021-10-27 LAB — CBC WITH DIFFERENTIAL/PLATELET
Basophils Absolute: 0 10*3/uL (ref 0.0–0.2)
Basos: 0 %
EOS (ABSOLUTE): 0.1 10*3/uL (ref 0.0–0.4)
Eos: 1 %
Hematocrit: 41.7 % (ref 37.5–51.0)
Hemoglobin: 14.2 g/dL (ref 13.0–17.7)
Immature Grans (Abs): 0 10*3/uL (ref 0.0–0.1)
Immature Granulocytes: 0 %
Lymphocytes Absolute: 1.2 10*3/uL (ref 0.7–3.1)
Lymphs: 12 %
MCH: 32.3 pg (ref 26.6–33.0)
MCHC: 34.1 g/dL (ref 31.5–35.7)
MCV: 95 fL (ref 79–97)
Monocytes Absolute: 1.6 10*3/uL — ABNORMAL HIGH (ref 0.1–0.9)
Monocytes: 16 %
Neutrophils Absolute: 7.1 10*3/uL — ABNORMAL HIGH (ref 1.4–7.0)
Neutrophils: 71 %
Platelets: 223 10*3/uL (ref 150–450)
RBC: 4.39 x10E6/uL (ref 4.14–5.80)
RDW: 11.9 % (ref 11.6–15.4)
WBC: 10 10*3/uL (ref 3.4–10.8)

## 2021-10-27 LAB — THYROID PANEL WITH TSH
Free Thyroxine Index: 1 — ABNORMAL LOW (ref 1.2–4.9)
T3 Uptake Ratio: 35 % (ref 24–39)
T4, Total: 2.9 ug/dL — ABNORMAL LOW (ref 4.5–12.0)
TSH: 1.27 u[IU]/mL (ref 0.450–4.500)

## 2021-10-30 ENCOUNTER — Telehealth: Payer: Self-pay | Admitting: *Deleted

## 2021-10-30 NOTE — Telephone Encounter (Signed)
Copied from [place] [id number]. Topic: General - Call Back - No Documentation >> Oct 30, 2021  9:56 AM Scherrie Gerlach wrote: Reason for CRM: pt would like cb for lab results.  Levada Dy has resulted.  Call sister [name]

## 2021-11-02 ENCOUNTER — Encounter: Payer: Self-pay | Admitting: *Deleted

## 2021-11-19 NOTE — Progress Notes (Signed)
11/19/2021 [name] [id number] June 20, 1951   Chief Complaint: [name] refill   History of Present Illness: [name]. Puleo is a 71 year old male with a past medical history of alcohol use disorder (abstinent fot 15+ years), dementia, seizures, CVA  and chronic hepatitis C GT1a treated with Harvoni 07/2016 x 1 month with SVR and PBC (diagnosed with elevated Alk phos, ANA and AMA levels 10/2019) on Urso and adenomatous and tubulovillous adenomatous colon polyps.  Past left hand/wrist surgery and left hip arthroplasty surgery.  He was last seen by Dr. Hilarie Fredrickson 11/14/2020, at that time his alk phos level was stable and he was instructed to continue ursodiol 500 mg twice daily.  An EGD was recommended to exclude upper GI tract changes of portal hypertension given the large caudate lobe identified per abdominal MRI 12/2019 and diagnosis of PBC and to schedule a surveillance colonoscopy due to his prior history of adenomatous and tubulovillous adenomatous polyps per colonoscopy in 2018.  He previously deferred to scheduling these procedures due to concerns regarding cost and Medicare/Medicaid coverage.  He presents to our office today accompanied by his sister [name].  He is compliant with taking his Urso as prescribed.  No pruritus.  No jaundice.  Mr. Hohn has mild dementia with intermittent confusion, sometimes wanders away from home.  He lives with his nephew and 2 of his sisters are in close contact with him and participate in his care.  Patient stated she is responsible for his medical appointments and she participates in his medical decisions but Daijon does not have a legally appointed POA.   [name] stated she wishes to schedule the recommended EGD and colonoscopy for [facility] at this time.  He denies having any dysphagia, heartburn or upper abdominal pain.  He is passing normal formed brown bowel movement most days.  No lower abdominal pain.  No rectal bleeding or black stools.  His appetite is  good.  His weight is stable.   [name] stated he will he continues to take Keppra and Dilantin as prescribed.  No seizure activity for many years.  CBC Latest Ref Rng & Units 10/26/2021 06/06/2021 01/23/2021  WBC 3.4 - 10.8 x10E3/uL 10.0 4.9 5.4  Hemoglobin 13.0 - 17.7 g/dL 14.2 14.7 15.6  Hematocrit 37.5 - 51.0 % 41.7 42.4 45.5  Platelets 150 - 450 x10E3/uL 223 185.0 171    CMP Latest Ref Rng & Units 10/26/2021 06/06/2021 01/23/2021  Glucose 70 - 99 mg/dL 82 - 95  BUN 8 - 27 mg/dL 10 - 11  Creatinine 0.76 - 1.27 mg/dL 0.91 - 0.90  Sodium 134 - 144 mmol/L 137 - 142  Potassium 3.5 - 5.2 mmol/L 5.0 - 4.8  Chloride 96 - 106 mmol/L 98 - 103  CO2 20 - 29 mmol/L 28 - 23  Calcium 8.6 - 10.2 mg/dL 9.8 - 9.8  Total Protein 6.0 - 8.5 g/dL 7.4 7.7 7.6  Total Bilirubin 0.0 - 1.2 mg/dL 0.4 0.4 0.4  Alkaline Phos 44 - 121 IU/L 166(H) 144(H) 185(H)  AST 0 - 40 IU/L _0 ALT 0 - 44 IU/L 36 7 12     Abdominal MRI/MRCP 01/01/2020: Lower chest: Lung bases are essentially clear.   Hepatobiliary: Enlargement of the caudate (series 3/image 13). No definite additional findings to suggest cirrhosis. No suspicious/enhancing hepatic lesions. Scattered small hepatic cysts, measuring up to 8 mm in the right hepatic lobe (series 3/image 7). No hepatic steatosis.   Gallbladder is unremarkable. No  intrahepatic or extrahepatic ductal dilatation. Common duct measures 6 mm, within normal limits. No choledocholithiasis is seen.   Pancreas:  Within normal limits.   Spleen:  Within normal limits.   Adrenals/Urinary Tract:  Adrenal glands are within normal limits.   Kidneys are within normal limits.  No hydronephrosis.   Stomach/Bowel: Stomach is within normal limits.   Visualized bowel is grossly unremarkable.   Vascular/Lymphatic: No evidence of abdominal aortic aneurysm. Portal vein is patent.   No suspicious abdominal lymphadenopathy.   Other:  No abdominal ascites.   Musculoskeletal: No focal  osseous lesions.   IMPRESSION: Motion degraded images.   No intrahepatic or extrahepatic ductal dilatation. Specifically, no findings suspicious for primary biliary cholangitis.   Enlargement of the caudate, without definite additional findings to suggest cirrhosis. No suspicious/enhancing hepatic lesions.    RUQ 11/24/2019: No gallstones or biliary distention. No focal hepatic abnormality.  Colonoscopy 06/30/2017: - One 8 mm TA polyp in the transverse colon, removed with a cold snare. - One 8 mm TA polyp at the splenic flexure, removed piecemeal using a cold snare.  - One 10 mm TA polyp in the descending colon. - One 10 mm tubulovillous adenoma without high-grade dysplasia. - One 15 mm tubulovillous adenoma without high-grade dysplasia polyp in the sigmoid colon, removed      piecemeal using   a hot snare. Resected and retrieved. Clip was placed. - Diverticulosis in the entire examined colon. - Internal hemorrhoids. Current Outpatient Medications on File Prior to Visit  Medication Sig Dispense Refill   aspirin EC 81 MG tablet Take 1 tablet (81 mg total) by mouth daily. 100 tablet 0   levETIRAcetam (KEPPRA) 750 MG tablet Take 2 tablets (1,500 mg total) by mouth 2 (two) times daily. 360 tablet 3   memantine (NAMENDA) 10 MG tablet TAKE 1 TABLET BY MOUTH TWICE A DAY 180 tablet 0   metoprolol succinate (TOPROL-XL) 25 MG 24 hr tablet Take 1 tablet (25 mg total) by mouth daily. 90 tablet 1   phenytoin (DILANTIN) 100 MG ER capsule Take 2 capsules (200 mg total) by mouth at bedtime. 180 capsule 3   phenytoin (PHENYTOIN INFATABS) 50 MG tablet CHEW 0.5 TABLETS (25 MG TOTAL) BY MOUTH AT BEDTIME. 45 tablet 3   simvastatin (ZOCOR) 20 MG tablet TAKE 1 TABLET BY MOUTH DAILY AT 6 PM. 90 tablet 1   ursodiol (ACTIGALL) 250 MG tablet TAKE 2 TABLETS BY MOUTH IN THE MORNING AND AT BEDTIME. NEEDS ENDO/COLON FOR ADDITIONAL REFILLS 120 tablet 0   No current facility-administered medications on file prior to  visit.   No Known Allergies  Current Medications, Allergies, Past Medical History, Past Surgical History, Family History and Social History were reviewed in [employer] record.  Review of Systems:   Constitutional: Negative for fever, sweats, chills or weight loss.  Respiratory: Negative for shortness of breath.   Cardiovascular: Negative for chest pain, palpitations and leg swelling.  Gastrointestinal: See HPI.  Musculoskeletal: Negative for back pain or muscle aches.  Neurological: Left extremity weakness since past CVA.  Physical Exam: BP 110/70    Pulse 67    Ht _0  (1.727 m)    Wt 150 lb (68 kg)    SpO2 97%    BMI 22.81 kg/m   General: 71 year old male in no acute distress. Head: Normocephalic and atraumatic. Eyes: No scleral icterus. Conjunctiva pink . Ears: Normal auditory acuity. Mouth: Upper dentures. No ulcers or lesions.  Lungs: Clear throughout to auscultation.  Heart: Regular rate and rhythm, no murmur. Abdomen: Soft, nontender and nondistended. No masses or hepatomegaly. Normal bowel sounds x 4 quadrants.  Rectal: Deferred.  Musculoskeletal: LUE deformity secondary to CVA.  Extremities: No edema. Neurological: Alert oriented x 4. No focal deficits. Left hemiparesis.  Psychological: Alert and cooperative. Normal mood and affect  Assessment and Recommendations:  31) 71 year old male with primary biliary cholangitis stable alk phos levels and normal AST/ALT on Urso 500 mg p.o. twice daily. Abdominal MRI 12/2019 did not show any evidence of cirrhosis but identified an enlarged caudate lobe.  -EGD remains recommended to exclude upper GI tract changes of portal hypertension given the large caudate lobe and diagnosis of PBC -Continue Urso 5 mg p.o. twice daily  2) History of hepatitis C treated successfully with Harvoni with sustained virologic response    3) History of adenomatous and tubulovillous adenomas of the colon per colonoscopy in 2018.   Surveillance colonoscopy was due 06/2020, however, the patient did not schedule this procedure due to concerns regarding cost/? Medicare coverage.  He and his sister wish to schedule an EGD and colonoscopy at this time. -Schedule surveillance colonoscopy benefits and risks discussed including risk with sedation, risk of bleeding, perforation and infection  -His Sister [name] verified that really would have family support and assistance to ensure he hydrated well and followed the bowel prep instructions prior to his procedure date.  4) History of seizure disorder, stable on Keppra and Dilantin.  No seizure activity for many years as reported by his sister [name].  Further recommendations to be determined after EGD and colonoscopy completed

## 2021-11-20 ENCOUNTER — Encounter: Payer: Self-pay | Admitting: Nurse Practitioner

## 2021-11-20 ENCOUNTER — Ambulatory Visit (INDEPENDENT_AMBULATORY_CARE_PROVIDER_SITE_OTHER): Payer: Medicare Other | Admitting: Nurse Practitioner

## 2021-11-20 VITALS — BP 110/70 | HR 67 | Ht 68.0 in | Wt 150.0 lb

## 2021-11-20 DIAGNOSIS — K743 Primary biliary cirrhosis: Secondary | ICD-10-CM

## 2021-11-20 DIAGNOSIS — Z860100 Personal history of colon polyps, unspecified: Secondary | ICD-10-CM

## 2021-11-20 MED ORDER — NA SULFATE-K SULFATE-MG SULF 17.5-3.13-1.6 GM/177ML PO SOLN: 1.0000 | ORAL | 0 refills | Status: DC

## 2021-11-20 MED ORDER — URSODIOL 500 MG PO TABS: 500.0000 mg | ORAL_TABLET | Freq: Two times a day (BID) | ORAL | 3 refills | Status: DC

## 2021-11-20 NOTE — Patient Instructions (Addendum)
PROCEDURES: You have been scheduled for a colonoscopy. Please follow the written instructions given to you at your visit today. Please pick up your prep supplies at the pharmacy within the next 1-3 days. If insurance does not pay for the prep we sent, use the Gatorade/miralax prep instructions we gave you today. If you use inhalers (even only as needed), please bring them with you on the day of your procedure.  Your medication has been refilled.  It was great seeing you today! Thank you for entrusting me with your care and choosing [facility].  Noralyn Pick, CRNP  The [facility] providers would like to encourage you to use [facility] to communicate with providers for non-urgent requests or questions.  Due to long hold times on the telephone, sending your provider a message by [facility] may be faster and more efficient way to get a response. Please allow 48 business hours for a response.  Please remember that this is for non-urgent requests/questions.  If you are age 50 or older, your body mass index should be between 23-30. Your Body mass index is 22.81 kg/m. If this is out of the aforementioned range listed, please consider follow up with your Primary Care Provider.  If you are age 82 or younger, your body mass index should be between 19-25. Your Body mass index is 22.81 kg/m. If this is out of the aformentioned range listed, please consider follow up with your Primary Care Provider.

## 2021-11-21 ENCOUNTER — Other Ambulatory Visit: Payer: Self-pay | Admitting: Internal Medicine

## 2021-11-21 ENCOUNTER — Ambulatory Visit (INDEPENDENT_AMBULATORY_CARE_PROVIDER_SITE_OTHER): Payer: Medicare Other | Admitting: Neurology

## 2021-11-21 ENCOUNTER — Encounter: Payer: Self-pay | Admitting: Neurology

## 2021-11-21 VITALS — BP 106/68 | HR 75 | Ht 68.0 in | Wt 150.0 lb

## 2021-11-21 DIAGNOSIS — R569 Unspecified convulsions: Secondary | ICD-10-CM | POA: Diagnosis not present

## 2021-11-21 DIAGNOSIS — Z5181 Encounter for therapeutic drug level monitoring: Secondary | ICD-10-CM | POA: Diagnosis not present

## 2021-11-21 DIAGNOSIS — R269 Unspecified abnormalities of gait and mobility: Secondary | ICD-10-CM

## 2021-11-21 NOTE — Patient Instructions (Signed)
Continue current medications  Will check anti seizure medications level  Follow up with your PCP  Return in 1 year or sooner if worse

## 2021-11-21 NOTE — Progress Notes (Signed)
PATIENT: [name] DOB: May 28, 1951  REASON FOR VISIT: follow up HISTORY FROM: patient  HISTORY OF PRESENT ILLNESS: Today 11/21/21 Patient presents today for follow-up, he is accompanied by his sister who lives with patient and his niece.  He has been doing well since last visit, no seizures.  Actually, the last seizure was more than 2 years ago.  He is compliant with his medications.  No recent falls.  Sister reports issue with his dementia, he usually wakes up early in the morning and start wandering, at times he has walk outside the house required sister to call 911 to bring him back.  He is able to bathe himself, clean himself, feed himself but other than that he depends on sister.  No other complaint.  Last phenytoin level was 13.3.   Update 01/23/2021 SS: Deloris Mittag is a 71 year old male with history of seizures and memory disorder, left hemiparesis.  On Dilantin and Keppra.  He had left-sided Bell's palsy in November 2021.  No recent seizures.  Left-sided Bell's palsy has resolved.  Can now completely close the left eye, with smiling very mild left eye closure, unsure if baseline or not.  Continues to live with his sister, at times does not want to take a shower.  He does well to take his medications, his sister manages these.  No falls, uses a walker.  They have no complaints today.  He is here today for follow-up accompanied by his sister, [name].  Update 08/23/2020 SS:Mr. Sackmann is a 71 year old male history of seizures and memory disorder.  He is on Dilantin and Keppra.  He lives with his sister, uses a walker.  Presented to the ER 07/25/20 with left ptosis, left-sided asymmetric smile, inability to raise his left forehead.  MRI of the brain was negative for acute findings, diagnosed with left-sided Bell's palsy, was treated with Valtrex and prednisone, given onset of Bell's palsy within 24 hours.  Was unable to fully close his left eye.  CMP showed mildly elevated alk phos 137, glucose  106.  He is about 75% better, able to close the eye about 85%, still left-sided asymmetric smile, left eye closure weakness.  No drooling. Has been taping his eye at night.  No seizures reported, tolerating medications.  No falls.  No history of DM.  Presents today for evaluation accompanied by his sister.  HISTORY 02/18/2020 SS: Mr. Busbee is a 71 year old male with history of seizures and memory disorder.  He was started on Dilantin around April 2020 after reported seizures (frequent staring spells every 2 weeks, 1 episode lying down, his whole body drew up).  He has been established at a good maintenance dose of Dilantin 225 mg daily, along with Keppra 1500 mg daily.  Since last seen, his sister reports 1-2 episodes of brief staring episodes, unsure exactly if these represent true seizures.  He lives with his sister, [name].  She manages his medications and helps with his care.  He has a left hemiparesis.  He uses a walker.  He has seen GI for elevated alkaline phosphatase, in the setting of a positive antimitochondrial antibody and anti-smooth muscle antibody, undergoing evaluation. Is overall doing well with medications, is overall pleased, no recent falls.   REVIEW OF SYSTEMS: Out of a complete 14 system review of symptoms, the patient complains only of the following symptoms, and all other reviewed systems are negative.  Seizure  ALLERGIES: No Known Allergies  HOME MEDICATIONS: Outpatient Medications Prior to Visit  Medication Sig Dispense Refill   aspirin EC 81 MG tablet Take 1 tablet (81 mg total) by mouth daily. 100 tablet 0   levETIRAcetam (KEPPRA) 750 MG tablet Take 2 tablets (1,500 mg total) by mouth 2 (two) times daily. 360 tablet 3   memantine (NAMENDA) 10 MG tablet TAKE 1 TABLET BY MOUTH TWICE A DAY 180 tablet 0   metoprolol succinate (TOPROL-XL) 25 MG 24 hr tablet Take 1 tablet (25 mg total) by mouth daily. 90 tablet 1   Na Sulfate-K Sulfate-Mg Sulf (SUPREP BOWEL PREP KIT)  17.5-3.13-1.6 GM/177ML SOLN Take 1 kit by mouth as directed. For colonoscopy prep 354 mL 0   phenytoin (DILANTIN) 100 MG ER capsule Take 2 capsules (200 mg total) by mouth at bedtime. 180 capsule 3   phenytoin (PHENYTOIN INFATABS) 50 MG tablet CHEW 0.5 TABLETS (25 MG TOTAL) BY MOUTH AT BEDTIME. 45 tablet 3   simvastatin (ZOCOR) 20 MG tablet TAKE 1 TABLET BY MOUTH DAILY AT 6 PM. 90 tablet 1   ursodiol (ACTIGALL) 250 MG tablet TAKE 2 TABLETS BY MOUTH IN THE MORNING AND AT BEDTIME. NEEDS ENDO/COLON FOR ADDITIONAL REFILLS 120 tablet 0   ursodiol (ACTIGALL) 500 MG tablet Take 1 tablet (500 mg total) by mouth in the morning and at bedtime. 180 tablet 3   No facility-administered medications prior to visit.    PAST MEDICAL HISTORY: Past Medical History:  Diagnosis Date   Alcoholism ([place])    History of, not active   Chronic hepatitis C ([place])    Dementia ([place])    Diverticulosis    Gait disorder    Gingival hypertrophy    Secondary to Dilantin   Hemiparesis and alteration of sensations as late effects of stroke ([place]) 01/25/2017   Left hemiparesis   Hiatal hernia    History of alcoholism ([place])    Hx of ischemic vertebrobasilar artery thalamic stroke    Right   Internal hemorrhoids    Memory disorder 03/04/2013   Primary biliary cirrhosis ([facility])    Seizures ([facility])    Stroke ([place])    Right frontal, right thalamic   Tubular adenoma of colon    Ulnar neuropathy of left upper extremity     PAST SURGICAL HISTORY: Past Surgical History:  Procedure Laterality Date   COLONOSCOPY WITH PROPOFOL N/A 07/10/2017   Procedure: COLONOSCOPY WITH PROPOFOL;  Surgeon: Otis Brace, MD;  Location: [facility];  Service: Gastroenterology;  Laterality: N/A;   ESOPHAGOGASTRODUODENOSCOPY (EGD) WITH PROPOFOL N/A 07/10/2017   Procedure: ESOPHAGOGASTRODUODENOSCOPY (EGD) WITH PROPOFOL;  Surgeon: Otis Brace, MD;  Location: [facility];  Service: Gastroenterology;  Laterality: N/A;   HIP ARTHROPLASTY Left  11/26/2013   Procedure: LEFT HIP HEMIARTHROPLASTY;  Surgeon: Mcarthur Rossetti, MD;  Location: [facility];  Service: Orthopedics;  Laterality: Left;   WRIST SURGERY Left 95 & 96    FAMILY HISTORY: Family History  Problem Relation Age of Onset   Pulmonary embolism Mother    Liver disease Father    Diabetes Sister    Hypertension Sister    Hypertension Sister    Hypertension Sister    Seizures Neg Hx     SOCIAL HISTORY: Social History   Socioeconomic History   Marital status: Divorced    Spouse name: Not on file   Number of children: 0   Years of education: 10   Highest education level: Not on file  Occupational History   Not on file  Tobacco Use   Smoking status: Former    Packs/day:  3.00    Types: Cigarettes    Quit date: 01/02/2014    Years since quitting: 7.8   Smokeless tobacco: Former  Substance and Sexual Activity   Alcohol use: No    Alcohol/week: 0.0 standard drinks    Comment: Former Alcoholic   Drug use: No   Sexual activity: Yes  Other Topics Concern   Not on file  Social History Narrative   Patient is single and lives with a friend and her son.   Patient does not have any children.   Patient is disabled.   Patient has a 10 th grade education.   Patient is left-handed.   Patient drinks some soda but not everyday.   Social Determinants of Health   Financial Resource Strain: Not on file  Food Insecurity: Not on file  Transportation Needs: Not on file  Physical Activity: Not on file  Stress: Not on file  Social Connections: Not on file  Intimate Partner Violence: Not on file    PHYSICAL EXAM  Vitals:   11/21/21 0952  BP: 106/68  Pulse: 75  Weight: 150 lb (68 kg)  Height: 5' 8" (1.727 m)   Body mass index is 22.81 kg/m.  Generalized: Well developed, in no acute distress  MMSE - Mini Mental State Exam 06/29/2021 08/18/2019 01/27/2018  Orientation to time 2 0 0  Orientation to Place _0 Registration _1 Attention/ Calculation 3 0 0   Recall 0 0 0  Language- name 2 objects _2 Language- repeat 1 0 1  Language- follow 3 step command _3 Language- read & follow direction 0 0 0  Write a sentence 0 0 0  Copy design 0 0 0  Total score _4 Neurological examination  Mentation: Alert, limited verbal response, most history is provided by his sister, follows most exam commands well, is well-appearing and groomed Cranial nerve II-XII: Pupils were equal round reactive to light. Extraocular movements were full, visual field were full on confrontational test.  Motor: Good strength of all extremities, mild spasticity left upper extremity Sensory: Normal to right and left side Coordination: Mild dysmetria on the left finger-nose-finger Gait and station: Rises from seated position with pushoff, ambulates with rolling walker, is wide-based, limp on the left Reflexes: Deep tendon reflexes are symmetric but decreased throughout   DIAGNOSTIC DATA (LABS, IMAGING, TESTING) - I reviewed patient records, labs, notes, testing and imaging myself where available.  Lab Results  Component Value Date   WBC 10.0 10/26/2021   HGB 14.2 10/26/2021   HCT 41.7 10/26/2021   MCV 95 10/26/2021   PLT 223 10/26/2021      Component Value Date/Time   NA 137 10/26/2021 1001   K 5.0 10/26/2021 1001   CL 98 10/26/2021 1001   CO2 28 10/26/2021 1001   GLUCOSE 82 10/26/2021 1001   GLUCOSE 101 (H) 07/25/2020 2156   BUN 10 10/26/2021 1001   CREATININE 0.91 10/26/2021 1001   CREATININE 0.93 04/08/2017 1642   CALCIUM 9.8 10/26/2021 1001   PROT 7.4 10/26/2021 1001   ALBUMIN 4.7 10/26/2021 1001   AST 28 10/26/2021 1001   ALT 36 10/26/2021 1001   ALKPHOS 166 (H) 10/26/2021 1001   BILITOT 0.4 10/26/2021 1001   GFRNONAA >60 07/25/2020 2138   GFRNONAA 86 04/08/2017 1642   GFRAA >60 07/25/2020 2138   GFRAA >89 04/08/2017 1642   Lab Results  Component Value Date  CHOL 144 04/03/2021   HDL 35 (L) 04/03/2021   LDLCALC 93 04/03/2021    TRIG 85 04/03/2021   CHOLHDL 4.1 04/03/2021   Lab Results  Component Value Date   HGBA1C 5.3 05/23/2016   No results found for: VITAMINB12 Lab Results  Component Value Date   TSH 1.270 10/26/2021   ASSESSMENT AND PLAN 70 y.o. year old male  has a past medical history of Alcoholism ([place]), Chronic hepatitis C ([place]), Dementia ([place]), Diverticulosis, Gait disorder, Gingival hypertrophy, Hemiparesis and alteration of sensations as late effects of stroke ([place]) (01/25/2017), Hiatal hernia, History of alcoholism ([place]), ischemic vertebrobasilar artery thalamic stroke, Internal hemorrhoids, Memory disorder (03/04/2013), Primary biliary cirrhosis ([place]), Seizures ([place]), Stroke ([place]), Tubular adenoma of colon, and Ulnar neuropathy of left upper extremity. here with:  1.  Seizure disorder 2.  Cerebrovascular disease, left hemiparesis 3.  Left-sided Bell's palsy (resolved) 4.  Gait disorder -No recurrent seizures -Continue Keppra 750 mg tablet, 2 tablets twice a day -Continue Dilantin total of 225 mg at bedtime  -Check routine labs today including Vitamin D level -Follow-up in 1 year or sooner if needed, call for seizure activity    Alric Ran, MD 11/21/2021, 9:54 AM [facility] [address] [place], [place] [address] [phone]

## 2021-11-22 LAB — PHENYTOIN LEVEL, TOTAL: Phenytoin (Dilantin), Serum: 14.7 ug/mL (ref 10.0–20.0)

## 2021-11-22 LAB — COMPREHENSIVE METABOLIC PANEL
ALT: 17 IU/L (ref 0–44)
AST: 14 IU/L (ref 0–40)
Albumin/Globulin Ratio: 1.6 (ref 1.2–2.2)
Albumin: 4.5 g/dL (ref 3.8–4.8)
Alkaline Phosphatase: 164 IU/L — ABNORMAL HIGH (ref 44–121)
BUN/Creatinine Ratio: 12 (ref 10–24)
BUN: 10 mg/dL (ref 8–27)
Bilirubin Total: 0.3 mg/dL (ref 0.0–1.2)
CO2: 27 mmol/L (ref 20–29)
Calcium: 9.5 mg/dL (ref 8.6–10.2)
Chloride: 103 mmol/L (ref 96–106)
Creatinine, Ser: 0.86 mg/dL (ref 0.76–1.27)
Globulin, Total: 2.9 g/dL (ref 1.5–4.5)
Glucose: 86 mg/dL (ref 70–99)
Potassium: 5.1 mmol/L (ref 3.5–5.2)
Sodium: 144 mmol/L (ref 134–144)
Total Protein: 7.4 g/dL (ref 6.0–8.5)
eGFR: 94 mL/min/{1.73_m2} (ref >59)

## 2021-11-22 LAB — LEVETIRACETAM LEVEL: Levetiracetam Lvl: 39.5 ug/mL (ref 10.0–40.0)

## 2021-11-22 LAB — VITAMIN D 25 HYDROXY (VIT D DEFICIENCY, FRACTURES): Vit D, 25-Hydroxy: 68 ng/mL (ref 30.0–100.0)

## 2021-11-23 ENCOUNTER — Telehealth: Payer: Self-pay | Admitting: *Deleted

## 2021-11-23 NOTE — Progress Notes (Signed)
Please call and advise the patient that the recent labs we checked including anti seizure medications level were within normal limits.  No further action is required on these tests at this time. Please remind patient to keep any upcoming appointments or tests and to call us with any interim questions, concerns, problems or updates. Thanks,   Alric Ran, MD

## 2021-11-23 NOTE — Telephone Encounter (Signed)
-----   Message from Alric Ran, MD sent at 11/23/2021  8:01 AM EST ----- Please call and advise the patient that the recent labs we checked including anti seizure medications level were within normal limits.  No further action is required on these tests at this time. Please remind patient to keep any upcoming appointments or tests and to call us with any interim questions, concerns, problems or updates. Thanks,   Alric Ran, MD

## 2021-11-23 NOTE — Telephone Encounter (Signed)
Left patient a detailed message, with results, on voicemail (ok per DPR).  Provided our number to call back with any questions.  

## 2021-11-25 NOTE — Progress Notes (Signed)
Addendum: Reviewed and agree with assessment and management plan. Henlee Donovan M, MD  

## 2021-12-05 ENCOUNTER — Other Ambulatory Visit: Payer: Self-pay | Admitting: Internal Medicine

## 2021-12-06 NOTE — Telephone Encounter (Signed)
Requested Prescriptions  Pending Prescriptions Disp Refills   memantine (NAMENDA) 10 MG tablet [Pharmacy Med Name: MEMANTINE HCL 10 MG TABLET] 180 tablet 0    Sig: TAKE 1 TABLET BY MOUTH TWICE A DAY     Neurology:  Alzheimer's Agents 2 Passed - 12/05/2021 12:51 PM      Passed - Cr in normal range and within 360 days    Creat  Date Value Ref Range Status  04/08/2017 0.93 0.70 - 1.25 mg/dL Final    Comment:      For patients > or = 69 years of age: The upper reference limit for Creatinine is approximately 13% higher for people identified as African-American.      Creatinine, Ser  Date Value Ref Range Status  11/21/2021 0.86 0.76 - 1.27 mg/dL Final         Passed - eGFR is 5 or above and within 360 days    GFR, Est African American  Date Value Ref Range Status  04/08/2017 >89 >=60 mL/min Final   GFR calc Af Amer  Date Value Ref Range Status  07/25/2020 >60 >60 mL/min Final   GFR, Est Non African American  Date Value Ref Range Status  04/08/2017 86 >=60 mL/min Final   GFR calc non Af Amer  Date Value Ref Range Status  07/25/2020 >60 >60 mL/min Final   GFR  Date Value Ref Range Status  06/15/2020 94.35 >60.00 mL/min Final   eGFR  Date Value Ref Range Status  11/21/2021 94 >59 mL/min/1.73 Final         Passed - Valid encounter within last 6 months    Recent Outpatient Visits          1 month ago Seizure [facility])   [place], [place], [place]   5 months ago Need for immunization against influenza   [place], Stephen L, RPH-CPP   8 months ago Essential hypertension   [place], Deborah B, MD   1 year ago Need for influenza vaccination   [place], Jarome Matin, RPH-CPP   1 year ago Essential hypertension   [place], MD      Future Appointments             In 1 month Wynetta Emery, Dalbert Batman, MD [place]

## 2021-12-08 ENCOUNTER — Other Ambulatory Visit: Payer: Self-pay

## 2021-12-14 ENCOUNTER — Other Ambulatory Visit: Payer: Self-pay | Admitting: Neurology

## 2021-12-14 NOTE — Telephone Encounter (Signed)
Rx refilled.

## 2021-12-18 ENCOUNTER — Other Ambulatory Visit: Payer: Self-pay

## 2021-12-18 ENCOUNTER — Ambulatory Visit (AMBULATORY_SURGERY_CENTER): Payer: Medicare Other | Admitting: Internal Medicine

## 2021-12-18 ENCOUNTER — Encounter: Payer: Self-pay | Admitting: Internal Medicine

## 2021-12-18 VITALS — BP 106/64 | HR 69 | Temp 97.7°F | Resp 12 | Ht 68.0 in | Wt 150.0 lb

## 2021-12-18 DIAGNOSIS — Z86010 Personal history of colonic polyps: Secondary | ICD-10-CM

## 2021-12-18 DIAGNOSIS — K2100 Gastro-esophageal reflux disease with esophagitis, without bleeding: Secondary | ICD-10-CM | POA: Diagnosis not present

## 2021-12-18 DIAGNOSIS — K449 Diaphragmatic hernia without obstruction or gangrene: Secondary | ICD-10-CM

## 2021-12-18 DIAGNOSIS — K219 Gastro-esophageal reflux disease without esophagitis: Secondary | ICD-10-CM | POA: Diagnosis not present

## 2021-12-18 DIAGNOSIS — K2970 Gastritis, unspecified, without bleeding: Secondary | ICD-10-CM | POA: Diagnosis not present

## 2021-12-18 DIAGNOSIS — K2270 Barrett's esophagus without dysplasia: Secondary | ICD-10-CM

## 2021-12-18 DIAGNOSIS — D125 Benign neoplasm of sigmoid colon: Secondary | ICD-10-CM

## 2021-12-18 DIAGNOSIS — K319 Disease of stomach and duodenum, unspecified: Secondary | ICD-10-CM | POA: Diagnosis not present

## 2021-12-18 DIAGNOSIS — D123 Benign neoplasm of transverse colon: Secondary | ICD-10-CM

## 2021-12-18 DIAGNOSIS — K743 Primary biliary cirrhosis: Secondary | ICD-10-CM

## 2021-12-18 DIAGNOSIS — I10 Essential (primary) hypertension: Secondary | ICD-10-CM | POA: Diagnosis not present

## 2021-12-18 MED ORDER — SODIUM CHLORIDE 0.9 % IV SOLN: 500.0000 mL | Freq: Once | INTRAVENOUS | Status: DC

## 2021-12-18 NOTE — Progress Notes (Signed)
Pt's states no medical or surgical changes since previsit or office visit. VS assessed by D.T 

## 2021-12-18 NOTE — Op Note (Signed)
[facility] Patient Name: [name] Procedure Date: 12/18/2021 3:07 PM MRN: [id number] Endoscopist: Jerene Bears , MD Age: 71 Referring MD:  Date of Birth: 02/28/1951 Gender: Male Account #: [id number] Procedure:                Colonoscopy Indications:              High risk colon cancer surveillance: Personal                            history of adenomas (10 mm or greater in size) and                            tubulovillous adenomas at last colonoscopy:                            September 2018 Medicines:                Monitored Anesthesia Care Procedure:                Pre-Anesthesia Assessment:                           - Prior to the procedure, a History and Physical                            was performed, and patient medications and                            allergies were reviewed. The patient's tolerance of                            previous anesthesia was also reviewed. The risks                            and benefits of the procedure and the sedation                            options and risks were discussed with the patient.                            All questions were answered, and informed consent                            was obtained. Prior Anticoagulants: The patient has                            taken no previous anticoagulant or antiplatelet                            agents. ASA Grade Assessment: III - A patient with                            severe systemic disease. After reviewing the risks  and benefits, the patient was deemed in                            satisfactory condition to undergo the procedure.                           After obtaining informed consent, the colonoscope                            was passed under direct vision. Throughout the                            procedure, the patient's blood pressure, pulse, and                            oxygen saturations were monitored continuously. The                             Olympus CF-HQ190L [id number]) Colonoscope was                            introduced through the anus and advanced to the                            cecum, identified by appendiceal orifice and                            ileocecal valve. The colonoscopy was performed                            without difficulty. The patient tolerated the                            procedure well. The quality of the bowel                            preparation was excellent. The ileocecal valve,                            appendiceal orifice, and rectum were photographed. Scope In: 3:16:34 PM Scope Out: 3:34:29 PM Scope Withdrawal Time: 0 hours 11 minutes 13 seconds  Total Procedure Duration: 0 hours 17 minutes 55 seconds  Findings:                 The digital rectal exam was normal.                           Two sessile polyps were found in the transverse                            colon. The polyps were 4 to 6 mm in size. These                            polyps were removed with a cold snare. Resection  and retrieval were complete.                           A 6 mm polyp was found in the sigmoid colon. The                            polyp was sessile. The polyp was removed with a                            cold snare. Resection and retrieval were complete.                           Internal hemorrhoids were found during                            retroflexion. The hemorrhoids were medium-sized. Complications:            No immediate complications. Estimated Blood Loss:     Estimated blood loss was minimal. Impression:               - Two 4 to 6 mm polyps in the transverse colon,                            removed with a cold snare. Resected and retrieved.                           - One 6 mm polyp in the sigmoid colon, removed with                            a cold snare. Resected and retrieved.                           - Internal hemorrhoids. Recommendation:            - Patient has a contact number available for                            emergencies. The signs and symptoms of potential                            delayed complications were discussed with the                            patient. Return to normal activities tomorrow.                            Written discharge instructions were provided to the                            patient.                           - Resume previous diet.                           -  Continue present medications.                           - Await pathology results.                           - Repeat colonoscopy is recommended for                            surveillance. The colonoscopy date will be                            determined after pathology results from today's                            exam become available for review. Jerene Bears, MD 12/18/2021 3:43:50 PM This report has been signed electronically.

## 2021-12-18 NOTE — Op Note (Signed)
[facility] Patient Name: [name] Procedure Date: 12/18/2021 3:07 PM MRN: [id number] Endoscopist: Jerene Bears , MD Age: 71 Referring MD:  Date of Birth: 05/28/1951 Gender: Male Account #: [id number] Procedure:                Upper GI endoscopy Indications:              PBC with enlarged caudate lobe, rule out esophageal                            varices Medicines:                Monitored Anesthesia Care Procedure:                Pre-Anesthesia Assessment:                           - Prior to the procedure, a History and Physical                            was performed, and patient medications and                            allergies were reviewed. The patient's tolerance of                            previous anesthesia was also reviewed. The risks                            and benefits of the procedure and the sedation                            options and risks were discussed with the patient.                            All questions were answered, and informed consent                            was obtained. Prior Anticoagulants: The patient has                            taken no previous anticoagulant or antiplatelet                            agents. ASA Grade Assessment: III - A patient with                            severe systemic disease. After reviewing the risks                            and benefits, the patient was deemed in                            satisfactory condition to undergo the procedure.  After obtaining informed consent, the endoscope was                            passed under direct vision. Throughout the                            procedure, the patient's blood pressure, pulse, and                            oxygen saturations were monitored continuously. The                            GIF [id number] #[id number] was introduced through the                            mouth, and advanced to the second part of duodenum.                             The upper GI endoscopy was accomplished without                            difficulty. The patient tolerated the procedure                            well. Scope In: Scope Out: Findings:                 The esophagus and gastroesophageal junction were                            examined with white light and narrow band imaging                            (NBI) from a forward view and retroflexed position.                            There were esophageal mucosal changes suggestive of                            short-segment Barrett's esophagus. These changes                            involved the mucosa at the upper extent of the                            gastric folds (40 cm from the incisors) extending                            to the Z-line (37 cm from the incisors).                            Circumferential salmon-colored mucosa was present  from 37 to 40 cm. The maximum longitudinal extent                            of these esophageal mucosal changes was 3 cm in                            length. Mucosa was biopsied with a cold forceps for                            histology in a targeted manner and in 4 quadrants                            at intervals of 2 cm. A total of 2 specimen bottles                            were sent to pathology.                           A 3 cm hiatal hernia was present.                           The gastroesophageal flap valve was visualized                            endoscopically and classified as Hill Grade IV (no                            fold, wide open lumen, hiatal hernia present).                           There is no endoscopic evidence of varices in the                            entire esophagus.                           Mild inflammation characterized by striped erythema                            was found in the gastric antrum and prepyloric                            stomach.  Biopsies were taken with a cold forceps                            for histology and Helicobacter pylori testing.                           The examined duodenum was normal. Complications:            No immediate complications. Estimated Blood Loss:     Estimated blood loss was minimal. Impression:               - Esophageal mucosal changes suggestive of  short-segment Barrett's esophagus. Biopsied.                           - 3 cm hiatal hernia.                           - Gastritis. Biopsied.                           - Normal examined duodenum.                           - No evidence of esophageal or gastric varices. No                            portal gastropathy. Recommendation:           - Patient has a contact number available for                            emergencies. The signs and symptoms of potential                            delayed complications were discussed with the                            patient. Return to normal activities tomorrow.                            Written discharge instructions were provided to the                            patient.                           - Resume previous diet.                           - Continue present medications.                           - Await pathology results.                           - See the other procedure note for documentation of                            additional recommendations. Jerene Bears, MD 12/18/2021 3:41:38 PM This report has been signed electronically.

## 2021-12-18 NOTE — Patient Instructions (Signed)
Handouts given for polyps, hiatal hernia and Gastritis.  YOU HAD AN ENDOSCOPIC PROCEDURE TODAY AT [facility]:   Refer to the procedure report that was given to you for any specific questions about what was found during the examination.  If the procedure report does not answer your questions, please call your gastroenterologist to clarify.  If you requested that your care partner not be given the details of your procedure findings, then the procedure report has been included in a sealed envelope for you to review at your convenience later.  YOU SHOULD EXPECT: Some feelings of bloating in the abdomen. Passage of more gas than usual.  Walking can help get rid of the air that was put into your GI tract during the procedure and reduce the bloating. If you had a lower endoscopy (such as a colonoscopy or flexible sigmoidoscopy) you may notice spotting of blood in your stool or on the toilet paper. If you underwent a bowel prep for your procedure, you may not have a normal bowel movement for a few days.  Please Note:  You might notice some irritation and congestion in your nose or some drainage.  This is from the oxygen used during your procedure.  There is no need for concern and it should clear up in a day or so.  SYMPTOMS TO REPORT IMMEDIATELY:  Following lower endoscopy (colonoscopy):  Excessive amounts of blood in the stool  Significant tenderness or worsening of abdominal pains  Swelling of the abdomen that is new, acute  Fever of 100F or higher  Following upper endoscopy (EGD)  Vomiting of blood or coffee ground material  New chest pain or pain under the shoulder blades  Painful or persistently difficult swallowing  New shortness of breath  Black, tarry-looking stools  For urgent or emergent issues, a gastroenterologist can be reached at any hour by calling [phone]. Do not use MyChart messaging for urgent concerns.   DIET:  We do recommend a small meal at first, but  then you may proceed to your regular diet.  Drink plenty of fluids but you should avoid alcoholic beverages for 24 hours.  ACTIVITY:  You should plan to take it easy for the rest of today and you should NOT DRIVE, MOVE SLOWLY AND CAREFULLY, NO WORK or use of heavy machinery  until tomorrow (because of the sedation medicines used during the test).    FOLLOW UP: Our staff will call the number listed on your records Wednesday about 715 AM and 8 AM following your procedure to check on you and address any questions or concerns that you may have regarding the information given to you following your procedure. If we do not reach you, we will leave a message.  We will attempt to reach you two times.  During this call, we will ask if you have developed any symptoms of COVID 19. If you develop any symptoms (ie: fever, flu-like symptoms, shortness of breath, cough etc.) before then, please call [phone].  If you test positive for Covid 19 in the 2 weeks post procedure, please call and report this information to [place].    If any biopsies were taken you will be contacted by phone or by letter within the next 1-3 weeks.  Please call us at [phone] if you have not heard about the biopsies in 3 weeks.    SIGNATURES/CONFIDENTIALITY: You and/or your care partner have signed paperwork which will be entered into your electronic medical record.  These signatures  attest to the fact that that the information above on your After Visit Summary has been reviewed and is understood.  Full responsibility of the confidentiality of this discharge information lies with you and/or your care-partner.

## 2021-12-18 NOTE — Progress Notes (Signed)
See office note dated 11/20/2021 Patient presenting for endoscopy and colonoscopy  Tolerated the prep  He remains appropriate for [facility] upper and lower endoscopy today  The nature of the procedure, as well as the risks, benefits, and alternatives were carefully and thoroughly reviewed with the patient. Ample time for discussion and questions allowed. The patient understood, was satisfied, and agreed to proceed.

## 2021-12-18 NOTE — Progress Notes (Signed)
Report to PACU, RN, vss, BBS= Clear.  

## 2021-12-18 NOTE — Progress Notes (Signed)
Called to room to assist during endoscopic procedure.  Patient ID and intended procedure confirmed with present staff. Received instructions for my participation in the procedure from the performing physician.  

## 2021-12-20 ENCOUNTER — Telehealth: Payer: Self-pay

## 2021-12-20 NOTE — Telephone Encounter (Signed)
?  Follow up Call- ? ?Call back number 12/18/2021  ?Post procedure Call Back phone  # [phone]  ?Permission to leave phone message Yes  ?Some recent data might be hidden  ?  ? ?Patient questions: ? ?Do you have a fever, pain , or abdominal swelling? No. ?Pain Score  0 * ? ?Have you tolerated food without any problems? Yes.   ? ?Have you been able to return to your normal activities? Yes.   ? ?Do you have any questions about your discharge instructions: ?Diet   No. ?Medications  No. ?Follow up visit  No. ? ?Do you have questions or concerns about your Care? No. ? ?Actions: ?* If pain score is 4 or above: ?No action needed, pain <4. ? ?Have you developed a fever since your procedure? no ? ?2.   Have you had an respiratory symptoms (SOB or cough) since your procedure? no ? ?3.   Have you tested positive for COVID 19 since your procedure no ? ?4.   Have you had any family members/close contacts diagnosed with the COVID 19 since your procedure?  no ? ? ?If yes to any of these questions please route to Joylene John, RN and Joella Prince, RN  ? ? ?

## 2021-12-25 ENCOUNTER — Encounter: Payer: Self-pay | Admitting: Internal Medicine

## 2021-12-25 ENCOUNTER — Other Ambulatory Visit: Payer: Self-pay

## 2021-12-25 DIAGNOSIS — K2270 Barrett's esophagus without dysplasia: Secondary | ICD-10-CM | POA: Insufficient documentation

## 2021-12-25 MED ORDER — PANTOPRAZOLE SODIUM 40 MG PO TBEC: 40.0000 mg | DELAYED_RELEASE_TABLET | Freq: Every day | ORAL | 3 refills | Status: DC

## 2022-01-01 ENCOUNTER — Other Ambulatory Visit: Payer: Self-pay | Admitting: Neurology

## 2022-01-02 NOTE — Telephone Encounter (Signed)
Rx refilled.

## 2022-01-10 ENCOUNTER — Telehealth: Payer: Self-pay | Admitting: *Deleted

## 2022-01-10 NOTE — Telephone Encounter (Signed)
Copied from [place] [id number]. Topic: General - Inquiry ?>> Jan 09, 2022  1:54 PM [name] D wrote: ?Pt's sister [name] called in saying Pt has found an assisted living home that he can go to.  They told him he will need an FL2 form filled out and the last visit sent to them for them to take him in. ? ?CB# sister  [phone] ? ?Fax [phone] ? ?[name].  [name] [employer] [phone] ?

## 2022-01-11 NOTE — Telephone Encounter (Signed)
Call placed to patient's sister # [phone] to inquire if she has confirmed [name] is ALF. ?Message left with call back requested to this CM. ? ?Call placed to [facility] to inquire if they have an ALF. Message left with call back requested to this CM. ?

## 2022-01-11 NOTE — Telephone Encounter (Signed)
Would pt need to have a visit with provider ?

## 2022-01-15 NOTE — Telephone Encounter (Signed)
Call placed to [facility] who confirmed that they do not have ALF, only skilled nursing.  ? ?Call placed to patient's sister # [phone] to inform her that [name] is not an ALF and discuss other possible care options for her brother. Message left with call back requested to this CM. ?  ?

## 2022-01-20 ENCOUNTER — Other Ambulatory Visit: Payer: Self-pay | Admitting: Physician Assistant

## 2022-01-20 DIAGNOSIS — E785 Hyperlipidemia, unspecified: Secondary | ICD-10-CM

## 2022-01-23 NOTE — Telephone Encounter (Signed)
?  Another call placed to patient's sister # [phone] to inform her that [name] is not an ALF and discuss other possible care options for her brother. Message left with call back requested to this CM.   ?

## 2022-01-23 NOTE — Telephone Encounter (Signed)
Requested medications are due for refill today.  no ? ?Requested medications are on the active medications list.  yes ? ?Last refill. 10/26/2021 #90 1 refill ? ?Future visit scheduled.   yes ? ?Notes to clinic.  Pharmacy needs Dx code. Pt should have a 90 day supply. ? ? ? ?Requested Prescriptions  ?Pending Prescriptions Disp Refills  ? simvastatin (ZOCOR) 20 MG tablet [Pharmacy Med Name: SIMVASTATIN 20 MG TABLET] 90 tablet 1  ?  Sig: TAKE 1 TABLET BY MOUTH DAILY AT 6 PM.  ?  ? Cardiovascular:  Antilipid - Statins Failed - 01/20/2022  4:01 PM  ?  ?  Failed - Lipid Panel in normal range within the last 12 months  ?  Cholesterol, Total  ?Date Value Ref Range Status  ?04/03/2021 144 100 - 199 mg/dL Final  ? ?LDL Chol Calc (NIH)  ?Date Value Ref Range Status  ?04/03/2021 93 0 - 99 mg/dL Final  ? ?HDL  ?Date Value Ref Range Status  ?04/03/2021 35 (L) >39 mg/dL Final  ? ?Triglycerides  ?Date Value Ref Range Status  ?04/03/2021 85 0 - 149 mg/dL Final  ? ?  ?  ?  Passed - Patient is not pregnant  ?  ?  Passed - Valid encounter within last 12 months  ?  Recent Outpatient Visits   ? ?      ? 2 months ago Seizure [facility])  ? [place], [place], [place]  ? 6 months ago Need for immunization against influenza  ? [place], RPH-CPP  ? 9 months ago Essential hypertension  ? [place] Ladell Pier, MD  ? 1 year ago Need for influenza vaccination  ? [place], RPH-CPP  ? 1 year ago Essential hypertension  ? [place] Ladell Pier, MD  ? ?  ?  ?Future Appointments   ? ?        ? In 3 months Ladell Pier, MD [place]  ? ?  ? ?  ?  ?  ?  ?

## 2022-01-24 ENCOUNTER — Ambulatory Visit: Payer: Medicare Other | Admitting: Neurology

## 2022-01-26 ENCOUNTER — Ambulatory Visit: Payer: Medicare Other | Admitting: Internal Medicine

## 2022-01-29 DIAGNOSIS — I2510 Atherosclerotic heart disease of native coronary artery without angina pectoris: Secondary | ICD-10-CM | POA: Diagnosis not present

## 2022-01-29 DIAGNOSIS — Z136 Encounter for screening for cardiovascular disorders: Secondary | ICD-10-CM | POA: Diagnosis not present

## 2022-02-19 ENCOUNTER — Telehealth: Payer: Self-pay

## 2022-02-19 ENCOUNTER — Ambulatory Visit: Payer: Medicare Other | Attending: Internal Medicine | Admitting: Internal Medicine

## 2022-02-19 ENCOUNTER — Encounter: Payer: Self-pay | Admitting: Internal Medicine

## 2022-02-19 VITALS — BP 110/70 | HR 78 | Ht 67.0 in | Wt 150.0 lb

## 2022-02-19 DIAGNOSIS — Z9189 Other specified personal risk factors, not elsewhere classified: Secondary | ICD-10-CM

## 2022-02-19 DIAGNOSIS — H547 Unspecified visual loss: Secondary | ICD-10-CM | POA: Diagnosis not present

## 2022-02-19 DIAGNOSIS — F01518 Vascular dementia, unspecified severity, with other behavioral disturbance: Secondary | ICD-10-CM

## 2022-02-19 DIAGNOSIS — I10 Essential (primary) hypertension: Secondary | ICD-10-CM | POA: Diagnosis not present

## 2022-02-19 DIAGNOSIS — R569 Unspecified convulsions: Secondary | ICD-10-CM | POA: Diagnosis not present

## 2022-02-19 DIAGNOSIS — R3981 Functional urinary incontinence: Secondary | ICD-10-CM

## 2022-02-19 DIAGNOSIS — I69354 Hemiplegia and hemiparesis following cerebral infarction affecting left non-dominant side: Secondary | ICD-10-CM

## 2022-02-19 MED ORDER — PHENYTOIN SODIUM EXTENDED 100 MG PO CAPS: 200.0000 mg | ORAL_CAPSULE | Freq: Every day | ORAL | 3 refills | Status: DC

## 2022-02-19 NOTE — Telephone Encounter (Signed)
I met with the patient and his nephew, [name],  today when they were in the clinic. [name] explained that he needs to find an assisted living facility for the patient.  [name] said that the patient is confused and has a tendency to wander even though it has been a few months since he wandered outdoors.  He is concerned for his uncle's safety and would like him to be placed in a locked facility and is considering memory care. [name] did state that his uncle has episodes of incontinence of bowel/ bladder, he ambulates with a cane and needs assistance with medication management.  He mentioned [name] and I explained to him that [name] is not an ALF. We discussed medicaid coverage for ALF and memory care as Medicare does not pay for ALF.  ? ?I provided [name] with a listing of ALF/ memory care facilities in [facility] that accept Medicaid and instructed him to call those facilities. I then instructed him to call me with any facility that he is interested in that  has male bed openings and we can fax an FL2 and medication list to that/those facilities.  [name] said he understood and he will also want to visit the facilities.  ? ?I explained that if nothing is available in [facility], he will need to look at bed availability in other counties.   ?

## 2022-02-19 NOTE — Progress Notes (Signed)
? ? ?Patient ID: [name], male    DOB: 09/01/1951  MRN: [id number] ? ?CC: Hypertension and Urinary Frequency ? ? ?Subjective: ?[name] is a 71 y.o. male who presents for chronic ds management.  Pt's nephew, [name], is with him.  Nephew reports [name] is no longer involve in his care ?His concerns today include:  ?Pt with hx of hepatitis C treated, sz disorder, CVA with residual left-sided weakness uses a rolling walker, HTN, memory deficit (followed by neurology), tubular adenomas, PBC ?  ?Nephew reports [name] is no longer involve in his care because she felt she could no longer handle him.  Currently living with his other sister [name] and nephew.  Nephew reports that pt no longer cussing people out.  Pt can dress and bath himself, feeds himself.  No recent wandering off, last occur 09/2021.  ?Has urinary incontinence.  Wears diapers ?Nephew fills weekly med box ?Family is trying to get him into [facility] which nephew thinks is a nursing home.  Told he will need FL2 and list of meds. ? ?SZ/CVA with weakness:  saw neurologist 11/21/2021 ?Reports being out of Dilantin 100 mg capsules x 1 mth.  He is suppose to be on a total of 225 mg at bedtime.  Taking Keppra and Namenda.  Mini-Mental status score 06/2021 was 15.  Prior to that it was 10. ?Ambulates with cane.  Last fall was end of February associated with sz. ? ?HTN:  reports pt has been out of Metoprolol about 1 mth.  Nephew states he asked the pharmacy about it but did not get it.  Has access to BP cuff at home but they have not been checking BP. ? ?HL:  taking Zocor.  ? ?HM: request referral for eye exam and dental. Loss bottom dentures.   ?Patient Active Problem List  ? Diagnosis Date Noted  ? Barrett's esophagus 12/25/2021  ? Bell's palsy 08/23/2020  ? Elevated alkaline phosphatase level 11/17/2019  ? Adenomatous polyp of colon 09/02/2017  ? Functional urinary incontinence 09/02/2017  ? Hemiparesis and alteration of sensations as late effects  of stroke ([place]) 01/25/2017  ? 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/23/2015  ? HTN (hypertension) 06/20/2015  ? 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  ? Hip fracture ([place]) 11/26/2013  ? Seizure ([place]) 11/26/2013  ? Memory disorder 03/04/2013  ? Depressive disorder, not elsewhere classified 07/24/2012  ? Abnormality of gait 07/24/2012  ? Focal epilepsy with impairment of consciousness ([place]) 07/24/2012  ? Cerebral thrombosis with cerebral infarction ([place]) 07/24/2012  ? Lesion of ulnar nerve 07/24/2012  ?  ? ?Current Outpatient Medications on File Prior to Visit  ?Medication Sig Dispense Refill  ? aspirin EC 81 MG tablet Take 1 tablet (81 mg total) by mouth daily. 100 tablet 0  ? levETIRAcetam (KEPPRA) 750 MG tablet TAKE 2 TABLETS (1,500 MG TOTAL) BY MOUTH 2 (TWO) TIMES DAILY. 360 tablet 3  ? memantine (NAMENDA) 10 MG tablet TAKE 1 TABLET BY MOUTH TWICE A DAY 180 tablet 1  ? pantoprazole (PROTONIX) 40 MG tablet Take 1 tablet (40 mg total) by mouth daily. 90 tablet 3  ? PHENYTOIN INFATABS 50 MG tablet CHEW 0.5 TABLETS (25 MG TOTAL) BY MOUTH AT BEDTIME. 45 tablet 3  ? simvastatin (ZOCOR) 20 MG tablet TAKE 1 TABLET BY MOUTH DAILY AT 6 PM. 90 tablet 1  ? ursodiol (ACTIGALL) 250 MG tablet TAKE 2 TABLETS BY MOUTH IN THE MORNING AND AT BEDTIME. NEEDS ENDO/COLON FOR ADDITIONAL REFILLS 120 tablet 0  ?  ursodiol (ACTIGALL) 500 MG tablet Take 1 tablet (500 mg total) by mouth in the morning and at bedtime. 180 tablet 3  ? ?No current facility-administered medications on file prior to visit.  ? ? ?No Known Allergies ? ?Social History  ? ?Socioeconomic History  ? Marital status: Divorced  ?  Spouse name: Not on file  ? Number of children: 0  ? Years of education: 10  ? Highest education level: Not on file  ?Occupational History  ? Not on file  ?Tobacco Use  ? Smoking status: Former  ?  Packs/day: 3.00  ?  Types: Cigarettes  ?  Quit date: 01/02/2014  ?  Years since quitting: 8.1  ? Smokeless tobacco: Former  ?Substance and Sexual Activity  ? Alcohol  use: No  ?  Alcohol/week: 0.0 standard drinks  ?  Comment: Former Alcoholic  ? Drug use: No  ? Sexual activity: Yes  ?Other Topics Concern  ? Not on file  ?Social History Narrative  ? Patient is single and lives with a friend and her son.  ? Patient does not have any children.  ? Patient is disabled.  ? Patient has a 10 th grade education.  ? Patient is left-handed.  ? Patient drinks some soda but not everyday.  ? ?Social Determinants of Health  ? ?Financial Resource Strain: Not on file  ?Food Insecurity: Not on file  ?Transportation Needs: Not on file  ?Physical Activity: Not on file  ?Stress: Not on file  ?Social Connections: Not on file  ?Intimate Partner Violence: Not on file  ? ? ?Family History  ?Problem Relation Age of Onset  ? Pulmonary embolism Mother   ? Liver disease Father   ? Diabetes Sister   ? Hypertension Sister   ? Hypertension Sister   ? Hypertension Sister   ? Seizures Neg Hx   ? ? ?Past Surgical History:  ?Procedure Laterality Date  ? COLONOSCOPY WITH PROPOFOL N/A 07/10/2017  ? Procedure: COLONOSCOPY WITH PROPOFOL;  Surgeon: Otis Brace, MD;  Location: [place];  Service: Gastroenterology;  Laterality: N/A;  ? ESOPHAGOGASTRODUODENOSCOPY (EGD) WITH PROPOFOL N/A 07/10/2017  ? Procedure: ESOPHAGOGASTRODUODENOSCOPY (EGD) WITH PROPOFOL;  Surgeon: Otis Brace, MD;  Location: [facility];  Service: Gastroenterology;  Laterality: N/A;  ? HIP ARTHROPLASTY Left 11/26/2013  ? Procedure: LEFT HIP HEMIARTHROPLASTY;  Surgeon: Mcarthur Rossetti, MD;  Location: [place];  Service: Orthopedics;  Laterality: Left;  ? WRIST SURGERY Left 95 & 96  ? ? ?ROS: ?Review of Systems ?Negative except as stated above ? ?PHYSICAL EXAM: ?BP 110/70   Pulse 78   Ht '5\' 7"'$  (1.702 m)   Wt 150 lb (68 kg)   SpO2 96%   BMI 23.49 kg/m?   ?Physical Exam ? ?General appearance - alert, well appearing, older African-American male and in no distress.  Clothing clean. ?Mental status -patient has minimal difficulty  following a commands.  He does not know the day of the week, month or year.  He does not recall my name. ?Neck - supple, no significant adenopathy ?Mouth: He has dentures above but is edentulous below. ?Chest - clear to auscultation, no wheezes, rales or rhonchi, symmetric air entry ?Heart - normal rate, regular rhythm, normal S1, S2, no murmurs, rubs, clicks or gallops ?Neurological -decreased grip on the left.  Power 4/5 proximally and distally in the left upper extremity and same for the left lower extremity.  He ambulates with a cane. ?Extremities - peripheral pulses normal, no pedal edema, no clubbing  or cyanosis ? ? ? ?  Latest Ref Rng & Units 11/21/2021  ? 10:37 AM 10/26/2021  ? 10:01 AM 06/06/2021  ? 11:53 AM  ?CMP  ?Glucose 70 - 99 mg/dL 86   82     ?BUN 8 - 27 mg/dL 10   10     ?Creatinine 0.76 - 1.27 mg/dL 0.86   0.91     ?Sodium 134 - 144 mmol/L 144   137     ?Potassium 3.5 - 5.2 mmol/L 5.1   5.0     ?Chloride 96 - 106 mmol/L 103   98     ?CO2 20 - 29 mmol/L 27   28     ?Calcium 8.6 - 10.2 mg/dL 9.5   9.8     ?Total Protein 6.0 - 8.5 g/dL 7.4   7.4   7.7    ?Total Bilirubin 0.0 - 1.2 mg/dL 0.3   0.4   0.4    ?Alkaline Phos 44 - 121 IU/L 164   166   144    ?AST 0 - 40 IU/L '14   28   11    '$ ?ALT 0 - 44 IU/L 17   36   7    ? ?Lipid Panel  ?   ?Component Value Date/Time  ? CHOL 144 04/03/2021 1118  ? TRIG 85 04/03/2021 1118  ? HDL 35 (L) 04/03/2021 1118  ? CHOLHDL 4.1 04/03/2021 1118  ? CHOLHDL 2.5 05/29/2016 0943  ? VLDL 16 05/29/2016 0943  ? [place] 93 04/03/2021 1118  ? ? ?CBC ?   ?Component Value Date/Time  ? WBC 10.0 10/26/2021 1001  ? WBC 4.9 06/06/2021 1153  ? RBC 4.39 10/26/2021 1001  ? RBC 4.42 06/06/2021 1153  ? HGB 14.2 10/26/2021 1001  ? HCT 41.7 10/26/2021 1001  ? PLT 223 10/26/2021 1001  ? MCV 95 10/26/2021 1001  ? MCH 32.3 10/26/2021 1001  ? MCH 33.3 07/25/2020 2138  ? MCHC 34.1 10/26/2021 1001  ? MCHC 34.6 06/06/2021 1153  ? RDW 11.9 10/26/2021 1001  ? LYMPHSABS 1.2 10/26/2021 1001  ? MONOABS 0.6  06/06/2021 1153  ? EOSABS 0.1 10/26/2021 1001  ? BASOSABS 0.0 10/26/2021 1001  ? ? ?ASSESSMENT AND PLAN: ? ?1. Essential hypertension ?Repeat blood pressure today normal.  He reportedly has been off metoprolol for a

## 2022-02-19 NOTE — Patient Instructions (Signed)
Blood pressure is well controlled off metoprolol.  We will hold him off of that for now. ?I have sent refill on phenytoin 200 mg to your pharmacy. ?

## 2022-02-20 ENCOUNTER — Telehealth: Payer: Self-pay | Admitting: Internal Medicine

## 2022-02-20 NOTE — Telephone Encounter (Signed)
Call returned to patient's sister, Fraser Din.  She said she has started to call ALFs/Memory care facilities from the list that I gave her son yesterday.  She called one of the [facility] facilities and it was private pay. They recommended that she call [facility] [address] and she did and she is waiting for a call.  ? ?I instructed her to review the list again and to call the facilities that I have checked off - those facilities accept Medicaid and have memory care support. She said she understood and would call. I explained to her that she needs to tell them that she is interested in memory care and needs a male bed.  I encouraged her to visit any of the facilities on the list and to call me back with any questions.  She was very Patent attorney.  ?

## 2022-02-20 NOTE — Telephone Encounter (Signed)
Copied from [place]. Topic: General - Other ?>> Feb 20, 2022  9:20 AM Tessa Lerner A wrote: ?Reason for CRM: The patient's sister would like to speak with Charmayne Sheer when possible about assisted living for their brother  ? ?Please contact further when available ?

## 2022-02-28 ENCOUNTER — Ambulatory Visit: Payer: Self-pay

## 2022-02-28 NOTE — Telephone Encounter (Signed)
I have reviewed RNs note.  Agree with patient being seen in urgent care. ?

## 2022-02-28 NOTE — Telephone Encounter (Signed)
Will forward to provider. Pt seen provider on 5/1 ?

## 2022-02-28 NOTE — Telephone Encounter (Signed)
Urine foul smelling ?Voiding more at night but not drinking  as much x 1 month ?Pt's nephew Patterson Hammersmith) stated pt is unable to answer triage questions.  ?No available appt's this week. Sent message to [facility] who stated there are no appt's available for the week. Advised pt's nephew that his Barbaraann Rondo will need to go to [facility] for evaluation. Nephew is currently in hospital. Advised to try and find someone to evaluate pt. Nephew verbalized understanding. ?Reason for Disposition ? Requesting regular office appointment ? ?Answer Assessment - Initial Assessment Questions ?1. REASON FOR CALL or QUESTION: "What is your reason for calling today?" or "How can I best help you?" or "What question do you have that I can help answer?" ?    Pt's nephew called to report 1 month of voiding fowl smelling urine more frequently at night. ? ?Protocols used: Information Only Call - No Triage-A-AH ? ?

## 2022-03-09 ENCOUNTER — Telehealth: Payer: Self-pay | Admitting: Internal Medicine

## 2022-03-09 ENCOUNTER — Other Ambulatory Visit: Payer: Self-pay | Admitting: Neurology

## 2022-03-09 NOTE — Telephone Encounter (Signed)
Copied from [place] [id number]. Topic: Referral - Status >> Mar 09, 2022  9:20 AM Antonieta Iba C wrote: Reason for CRM: pt's nephew called in to follow up on Dentist referral. Nephew would like assistance with actually getting pt set up with a dentist.   Please assist further.   CB: [phone] -

## 2022-03-13 NOTE — Telephone Encounter (Signed)
Rx refilled.

## 2022-03-25 ENCOUNTER — Other Ambulatory Visit: Payer: Self-pay | Admitting: Physician Assistant

## 2022-03-25 ENCOUNTER — Other Ambulatory Visit: Payer: Self-pay | Admitting: Internal Medicine

## 2022-03-25 DIAGNOSIS — I10 Essential (primary) hypertension: Secondary | ICD-10-CM

## 2022-03-26 ENCOUNTER — Other Ambulatory Visit: Payer: Self-pay

## 2022-03-26 ENCOUNTER — Emergency Department (HOSPITAL_BASED_OUTPATIENT_CLINIC_OR_DEPARTMENT_OTHER)
Admission: EM | Admit: 2022-03-26 | Discharge: 2022-03-26 | Disposition: A | Discharge status: Home / Self Care | Payer: Medicare Other | Attending: Emergency Medicine | Admitting: Emergency Medicine

## 2022-03-26 ENCOUNTER — Encounter (HOSPITAL_BASED_OUTPATIENT_CLINIC_OR_DEPARTMENT_OTHER): Payer: Self-pay

## 2022-03-26 ENCOUNTER — Emergency Department (HOSPITAL_BASED_OUTPATIENT_CLINIC_OR_DEPARTMENT_OTHER): Payer: Medicare Other

## 2022-03-26 DIAGNOSIS — R410 Disorientation, unspecified: Secondary | ICD-10-CM | POA: Diagnosis not present

## 2022-03-26 DIAGNOSIS — Z7982 Long term (current) use of aspirin: Secondary | ICD-10-CM | POA: Diagnosis not present

## 2022-03-26 DIAGNOSIS — F0390 Unspecified dementia without behavioral disturbance: Secondary | ICD-10-CM | POA: Insufficient documentation

## 2022-03-26 DIAGNOSIS — R569 Unspecified convulsions: Secondary | ICD-10-CM | POA: Diagnosis not present

## 2022-03-26 DIAGNOSIS — G40909 Epilepsy, unspecified, not intractable, without status epilepticus: Secondary | ICD-10-CM | POA: Insufficient documentation

## 2022-03-26 LAB — CBC
HCT: 43.1 % (ref 39.0–52.0)
Hemoglobin: 15 g/dL (ref 13.0–17.0)
MCH: 32.7 pg (ref 26.0–34.0)
MCHC: 34.8 g/dL (ref 30.0–36.0)
MCV: 93.9 fL (ref 80.0–100.0)
Platelets: 183 10*3/uL (ref 150–400)
RBC: 4.59 MIL/uL (ref 4.22–5.81)
RDW: 12.2 % (ref 11.5–15.5)
WBC: 4.3 10*3/uL (ref 4.0–10.5)
nRBC: 0 % (ref 0.0–0.2)

## 2022-03-26 LAB — URINALYSIS, ROUTINE W REFLEX MICROSCOPIC
Bilirubin Urine: NEGATIVE
Glucose, UA: NEGATIVE mg/dL
Hgb urine dipstick: NEGATIVE
Ketones, ur: NEGATIVE mg/dL
Leukocytes,Ua: NEGATIVE
Nitrite: NEGATIVE
Protein, ur: NEGATIVE mg/dL
Specific Gravity, Urine: 1.03 (ref 1.005–1.030)
pH: 5 (ref 5.0–8.0)

## 2022-03-26 LAB — RAPID URINE DRUG SCREEN, HOSP PERFORMED
Amphetamines: NOT DETECTED
Barbiturates: NOT DETECTED
Benzodiazepines: NOT DETECTED
Cocaine: NOT DETECTED
Opiates: NOT DETECTED
Tetrahydrocannabinol: NOT DETECTED

## 2022-03-26 LAB — COMPREHENSIVE METABOLIC PANEL
ALT: 16 U/L (ref 0–44)
AST: 16 U/L (ref 15–41)
Albumin: 4.7 g/dL (ref 3.5–5.0)
Alkaline Phosphatase: 125 U/L (ref 38–126)
Anion gap: 10 (ref 5–15)
BUN: 20 mg/dL (ref 8–23)
CO2: 29 mmol/L (ref 22–32)
Calcium: 9.7 mg/dL (ref 8.9–10.3)
Chloride: 99 mmol/L (ref 98–111)
Creatinine, Ser: 0.98 mg/dL (ref 0.61–1.24)
GFR, Estimated: >60 mL/min (ref >60)
Glucose, Bld: 99 mg/dL (ref 70–99)
Potassium: 4.3 mmol/L (ref 3.5–5.1)
Sodium: 138 mmol/L (ref 135–145)
Total Bilirubin: 0.3 mg/dL (ref 0.3–1.2)
Total Protein: 8 g/dL (ref 6.5–8.1)

## 2022-03-26 LAB — TROPONIN I (HIGH SENSITIVITY): Troponin I (High Sensitivity): 3 ng/L (ref <18)

## 2022-03-26 LAB — AMMONIA: Ammonia: 42 umol/L — ABNORMAL HIGH (ref 9–35)

## 2022-03-26 LAB — LACTIC ACID, PLASMA: Lactic Acid, Venous: 2.1 mmol/L (ref 0.5–1.9)

## 2022-03-26 MED ORDER — LEVETIRACETAM 500 MG PO TABS
1500.0000 mg | ORAL_TABLET | Freq: Once | ORAL | Status: AC
Start: 2022-03-26 — End: 2022-03-26
  Administered 2022-03-26: 1500 mg via ORAL
  Filled 2022-03-26: qty 3

## 2022-03-26 MED ORDER — PHENYTOIN SODIUM EXTENDED 100 MG PO CAPS
200.0000 mg | ORAL_CAPSULE | Freq: Once | ORAL | Status: AC
  Administered 2022-03-26: 200 mg via ORAL
  Filled 2022-03-26: qty 2

## 2022-03-26 NOTE — ED Notes (Signed)
Verbally reinforced d/c instructions with pt and his nephew, who is pt caregiver/driver -- pt and family acknowledge verbal understanding and deny any additional questions, concerns, needs- written d/c instructions provided --- pt to be escorted out to vehicle via w/c by ED staff -- nephew is pulling vehicle to front of dept

## 2022-03-26 NOTE — ED Triage Notes (Signed)
Pt family member reports that pt had a seizure last night and a seizure the night before last. Family reports that pt has h/x seizures and is taking his meds as prescribed. Family reports that the seizures both lasted 10-20 minutes but pt was awake and talking throughout both seizures.

## 2022-03-26 NOTE — Discharge Instructions (Addendum)
Please follow-up with neurology outpatient. Your workup was reassuring.  Department of Motor Vehicle [facility]) of [place] regulations for seizures - It is the patient's responsibility to report the incidence of the seizure in the state of [place]. [place] has no statutory provision requiring physicians to report patients diagnosed with epilepsy or seizures to a central state agency.  The recommended DMV regulation requirement for a driver in [place] for an individual with a seizure is that they be seizure-free for 6-12 months. However, the DMV may consider the following exceptions to this general rule where: (1) a physician-directed change in medication causes a seizure and the individual immediately resumes the previous therapy which controlled seizures; (2) there is a history of nocturnal seizures or seizures which do not involve loss of consciousness, loss of control of motor function, or loss of appropriate sensation and information process; and (3) an individual has a seizure disorder preceded by an aura (warning) lasting 2-3 minutes. While the [facility] may also give consideration to other unusual circumstances which may affect the general requirement that drivers be seizure-free for 6-12 months, interpretation of these circumstances and assignment of restrictions is at the discretion of the Medical Advisor. The DMV also considers compliance with medical therapy essential for safe driving. [facility] Physician's Guide to [employer] (June, 1995 ed.)] The Department learns of an individual's condition by inquiring on the application form or renewal form, a physician's report to the [facility], an accident report or from correspondence from the individual. The person may be required to submit a Medical Report Form either annually or semi-annually.

## 2022-03-26 NOTE — ED Triage Notes (Signed)
Pt family reports that one seizure was witnessed and reports that pt was crawling around on the floor asking for his mother and father during seizure. Pt family reports h/x dementia. Seizures usually consist of "dazing out" for patient.

## 2022-03-26 NOTE — ED Provider Notes (Signed)
[facility] EMERGENCY DEPT Provider Note   CSN: [id number] Arrival date & time: 03/26/22  1505     History {Add pertinent medical, surgical, social history, OB history to HPI:1} Chief Complaint  Patient presents with   Altered Mental Status    [name] is a 71 y.o. male.   Altered Mental Status    Had one seizure yesterday for 10-20 minutes. Has been more confused compared to his baseline over the past three days. No fever or chills.   Home Medications Prior to Admission medications   Medication Sig Start Date End Date Taking? Authorizing Provider  aspirin EC 81 MG tablet Take 1 tablet (81 mg total) by mouth daily. 03/04/20   Ladell Pier, MD  levETIRAcetam (KEPPRA) 750 MG tablet TAKE 2 TABLETS (1,500 MG TOTAL) BY MOUTH 2 (TWO) TIMES DAILY. 01/02/22   Suzzanne Cloud, NP  memantine (NAMENDA) 10 MG tablet TAKE 1 TABLET BY MOUTH TWICE A DAY 12/06/21   Ladell Pier, MD  pantoprazole (PROTONIX) 40 MG tablet Take 1 tablet (40 mg total) by mouth daily. 12/25/21   Pyrtle, Lajuan Lines, MD  phenytoin (DILANTIN) 100 MG ER capsule Take 2 capsules (200 mg total) by mouth at bedtime. 02/19/22   Ladell Pier, MD  PHENYTOIN INFATABS 50 MG tablet CHEW 0.5 TABLETS (25 MG TOTAL) BY MOUTH AT BEDTIME. 03/13/22   Suzzanne Cloud, NP  simvastatin (ZOCOR) 20 MG tablet TAKE 1 TABLET BY MOUTH DAILY AT 6 PM. 01/23/22   Ladell Pier, MD  ursodiol (ACTIGALL) 250 MG tablet TAKE 2 TABLETS BY MOUTH IN THE MORNING AND AT BEDTIME. NEEDS ENDO/COLON FOR ADDITIONAL REFILLS 03/26/22   Pyrtle, Lajuan Lines, MD  ursodiol (ACTIGALL) 500 MG tablet Take 1 tablet (500 mg total) by mouth in the morning and at bedtime. 11/20/21   Noralyn Pick, NP      Allergies    Patient has no known allergies.    Review of Systems   Review of Systems  Physical Exam Updated Vital Signs BP 135/76   Pulse 62   Temp 98 F (36.7 C)   Resp 18   Ht '5\' 7"'$  (1.702 m)   Wt 68 kg   SpO2 99%   BMI 23.49 kg/m   Physical Exam  ED Results / Procedures / Treatments   Labs (all labs ordered are listed, but only abnormal results are displayed) Labs Reviewed  COMPREHENSIVE METABOLIC PANEL  CBC  AMMONIA  PHENYTOIN LEVEL, FREE AND TOTAL  LACTIC ACID, PLASMA  ETHANOL  RAPID URINE DRUG SCREEN, HOSP PERFORMED  URINALYSIS, ROUTINE W REFLEX MICROSCOPIC  CBG MONITORING, ED  TROPONIN I (HIGH SENSITIVITY)    EKG None  Radiology No results found.  Procedures Procedures  {Document cardiac monitor, telemetry assessment procedure when appropriate:1}  Medications Ordered in ED Medications - No data to display  ED Course/ Medical Decision Making/ A&P                           Medical Decision Making Amount and/or Complexity of Data Reviewed Labs: ordered.   ***  {Document critical care time when appropriate:1} {Document review of labs and clinical decision tools ie heart score, Chads2Vasc2 etc:1}  {Document your independent review of radiology images, and any outside records:1} {Document your discussion with family members, caretakers, and with consultants:1} {Document social determinants of health affecting pt's care:1} {Document your decision making why or why not admission, treatments were needed:1}  Final Clinical Impression(s) / ED Diagnoses Final diagnoses:  None    Rx / DC Orders ED Discharge Orders     None

## 2022-03-27 NOTE — Telephone Encounter (Signed)
Discontinued 02/19/22. Requested Prescriptions  Pending Prescriptions Disp Refills  . metoprolol succinate (TOPROL-XL) 25 MG 24 hr tablet [Pharmacy Med Name: METOPROLOL SUCC ER 25 MG TAB] 90 tablet 1    Sig: TAKE 1 TABLET (25 MG TOTAL) BY MOUTH DAILY.     Cardiovascular:  Beta Blockers Passed - 03/25/2022  7:36 PM      Passed - Last BP in normal range    BP Readings from Last 1 Encounters:  03/26/22 113/73         Passed - Last Heart Rate in normal range    Pulse Readings from Last 1 Encounters:  03/26/22 66         Passed - Valid encounter within last 6 months    Recent Outpatient Visits          1 month ago Essential hypertension   [place], MD   5 months ago Seizure [facility])   [place] Mulga, Levada Dy M, [place]   9 months ago Need for immunization against influenza   [place], RPH-CPP   11 months ago Essential hypertension   [place] Ladell Pier, MD   1 year ago Need for influenza vaccination   [place], RPH-CPP

## 2022-03-31 LAB — PHENYTOIN LEVEL, FREE AND TOTAL
Phenytoin, Free: 1.6 ug/mL (ref 1.0–2.0)
Phenytoin, Total: 23.9 ug/mL — ABNORMAL HIGH (ref 10.0–20.0)

## 2022-04-24 ENCOUNTER — Other Ambulatory Visit: Payer: Self-pay | Admitting: Internal Medicine

## 2022-04-30 ENCOUNTER — Ambulatory Visit: Payer: Medicare Other | Attending: Internal Medicine | Admitting: Internal Medicine

## 2022-04-30 ENCOUNTER — Encounter: Payer: Self-pay | Admitting: Internal Medicine

## 2022-04-30 VITALS — BP 117/72 | HR 70 | Temp 98.2°F | Resp 12 | Wt 146.0 lb

## 2022-04-30 DIAGNOSIS — R569 Unspecified convulsions: Secondary | ICD-10-CM | POA: Diagnosis not present

## 2022-04-30 DIAGNOSIS — F01518 Vascular dementia, unspecified severity, with other behavioral disturbance: Secondary | ICD-10-CM | POA: Diagnosis not present

## 2022-04-30 NOTE — Progress Notes (Signed)
Patient still has confusion Does not know where the bathroom is or who caregiver at times.

## 2022-04-30 NOTE — Patient Instructions (Signed)
It can take several months for [name] to  be placed in a memory unit.  I recommend that you call a few of these facilities and perhaps visit them as well to get the process started.  You do not have to wait until after he sees the eye doctor or get straight with his dentures.

## 2022-04-30 NOTE — Progress Notes (Signed)
Patient ID: [name], male    DOB: 16-Nov-1950  MRN: [id number]  CC: Altered Mental Status   Subjective: [name] is a 72 y.o. male who presents for ER f/u.  Pt's nephew who is wheelchair bound with LT BKA, [name], is with him and gives most of the hx His concerns today include:  Pt with hx of hepatitis C treated, sz disorder, CVA with residual left-sided weakness uses a rolling walker, HTN, memory deficit (followed by neurology), vascular dementia with beh disturbance, tubular adenomas, PBC  Seen in ER 03/26/22 with ? Sz activity day before and mental confusion. Work-up in the ER included CAT scan of the head with no new findings.  CBC, CMP, urine drug screen and screen for UTI which were negative.  Phenytoin level slightly elevated at 23.  No sz since ER visit.  Reports compliance with Keppra and Phenytoin. However pt still confused at times Sometimes does not know who nephew is or where bathroom is in the house.  Short term memory most affected.  No wondering off during the day or night.  Goes to bed 11 p.m to 12 a.m.  Gets up about 6 a.m.  Gets up intermittently through the night. Eating well. Takes meds with out resistance.   No falls, ambulates with cane Plan on last visit was for placement in memory unit.  Nephew had spoken with our [place] and was given the names/list of facilities that can be considered.  Nephew was supposed to call and visit 1 of 2 of the facilities then make a decision about where they would like the patient to be placed.  Nephew has not done so as yet.  He states they were working on getting the patient his dentures and an eye exam.  Reports patient has poor vision.  Has upcoming appointment with an eye doctor next month.  He has received his dentures already but they are currently having to be adjusted.  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 100 tablet 0   levETIRAcetam (KEPPRA) 750 MG tablet TAKE 2 TABLETS (1,500 MG TOTAL) BY MOUTH 2 (TWO) TIMES DAILY. 360 tablet 3   memantine (NAMENDA) 10 MG tablet TAKE 1 TABLET BY MOUTH TWICE A DAY 180 tablet 1   pantoprazole (PROTONIX) 40 MG tablet Take 1 tablet (40 mg total) by mouth daily. 90 tablet 3   phenytoin (DILANTIN) 100 MG ER capsule Take 2 capsules (200 mg total) by mouth at bedtime. 180 capsule 3   PHENYTOIN INFATABS 50 MG tablet CHEW 0.5 TABLETS (25 MG TOTAL) BY MOUTH AT BEDTIME. 45 tablet  3   simvastatin (ZOCOR) 20 MG tablet TAKE 1 TABLET BY MOUTH DAILY AT 6 PM. 90 tablet 1   ursodiol (ACTIGALL) 250 MG tablet TAKE 2 TABLETS BY MOUTH IN THE MORNING AND AT BEDTIME. NEEDS ENDO/COLON FOR ADDITIONAL REFILLS 120 tablet 3   ursodiol (ACTIGALL) 500 MG tablet Take 1 tablet (500 mg total) by mouth in the morning and at bedtime. 180 tablet 3   No current facility-administered medications on file prior to visit.    No Known Allergies  Social History   Socioeconomic History   Marital status: Divorced    Spouse name: Not on file   Number of children: 0   Years of education: 10   Highest education  level: Not on file  Occupational History   Not on file  Tobacco Use   Smoking status: Former    Packs/day: 3.00    Types: Cigarettes    Quit date: 01/02/2014    Years since quitting: 8.3   Smokeless tobacco: Former  Substance and Sexual Activity   Alcohol use: No    Alcohol/week: 0.0 standard drinks of alcohol    Comment: Former Alcoholic   Drug use: No   Sexual activity: Yes  Other Topics Concern   Not on file  Social History Narrative   Patient is single and lives with a friend and her son.   Patient does not have any children.   Patient is disabled.   Patient has a 10 th grade education.   Patient is left-handed.   Patient drinks some soda but not everyday.   Social Determinants of Health   Financial Resource Strain: Not on file  Food Insecurity: Not on file  Transportation Needs: Not on file  Physical Activity: Not on file  Stress: Not on file  Social Connections: Not on file  Intimate Partner Violence: Not on file    Family History  Problem Relation Age of Onset   Pulmonary embolism Mother    Liver disease Father    Diabetes Sister    Hypertension Sister    Hypertension Sister    Hypertension Sister    Seizures Neg Hx     Past Surgical History:  Procedure Laterality Date   COLONOSCOPY WITH PROPOFOL N/A 07/10/2017   Procedure: COLONOSCOPY WITH PROPOFOL;  Surgeon: Otis Brace, MD;  Location: [facility];  Service: Gastroenterology;  Laterality: N/A;   ESOPHAGOGASTRODUODENOSCOPY (EGD) WITH PROPOFOL N/A 07/10/2017   Procedure: ESOPHAGOGASTRODUODENOSCOPY (EGD) WITH PROPOFOL;  Surgeon: Otis Brace, MD;  Location: [facility];  Service: Gastroenterology;  Laterality: N/A;   HIP ARTHROPLASTY Left 11/26/2013   Procedure: LEFT HIP HEMIARTHROPLASTY;  Surgeon: Mcarthur Rossetti, MD;  Location: [facility];  Service: Orthopedics;  Laterality: Left;   WRIST SURGERY Left 95 & 96    ROS: Review of Systems Negative except as stated above  PHYSICAL EXAM: BP  117/72 (BP Location: Left Arm, Patient Position: Sitting, Cuff Size: Normal)   Pulse 70   Temp 98.2 F (36.8 C) (Oral)   Resp 12   Wt 146 lb (66.2 kg)   SpO2 97%   BMI 22.87 kg/m   Wt Readings from Last 3 Encounters:  04/30/22 146 lb (66.2 kg)  03/26/22 150 lb (68 kg)  02/19/22 150 lb (68 kg)    Physical Exam  General appearance -patient is alert elderly African-American male in NAD.  Clothing clean.  He is able to answer some questions. Mental status -patient oriented to name and date of only.  He does not recall  the month or year.  He follows commands appropriately. Chest - clear to auscultation, no wheezes, rales or rhonchi, symmetric air entry Heart - normal rate, regular rhythm, normal S1, S2, no murmurs, rubs, clicks or gallops Neurological -mild nystagmus on lateral gaze lasting a few seconds.  Gait is slow with low foot to floor clearance with some dipping on the right.  Ambulates with a cane. Extremities - peripheral pulses normal, no pedal edema, no clubbing or cyanosis      Latest Ref Rng & Units 03/26/2022    5:55 PM 11/21/2021   10:37 AM 10/26/2021   10:01 AM  CMP  Glucose 70 - 99 mg/dL 99  86  82   BUN 8 - 23 mg/dL '20  10  10   '$ Creatinine 0.61 - 1.24 mg/dL 0.98  0.86  0.91   Sodium 135 - 145 mmol/L 138  144  137   Potassium 3.5 - 5.1 mmol/L 4.3  5.1  5.0   Chloride 98 - 111 mmol/L 99  103  98   CO2 22 - 32 mmol/L '29  27  28   '$ Calcium 8.9 - 10.3 mg/dL 9.7  9.5  9.8   Total Protein 6.5 - 8.1 g/dL 8.0  7.4  7.4   Total Bilirubin 0.3 - 1.2 mg/dL 0.3  0.3  0.4   Alkaline Phos 38 - 126 U/L 125  164  166   AST 15 - 41 U/L '16  14  28   '$ ALT 0 - 44 U/L 16  17  36    Lipid Panel     Component Value Date/Time   CHOL 144 04/03/2021 1118   TRIG 85 04/03/2021 1118   HDL 35 (L) 04/03/2021 1118   CHOLHDL 4.1 04/03/2021 1118   CHOLHDL 2.5 05/29/2016 0943   VLDL 16 05/29/2016 0943   LDLCALC 93 04/03/2021 1118    CBC    Component Value Date/Time   WBC 4.3 03/26/2022  1755   RBC 4.59 03/26/2022 1755   HGB 15.0 03/26/2022 1755   HGB 14.2 10/26/2021 1001   HCT 43.1 03/26/2022 1755   HCT 41.7 10/26/2021 1001   PLT 183 03/26/2022 1755   PLT 223 10/26/2021 1001   MCV 93.9 03/26/2022 1755   MCV 95 10/26/2021 1001   MCH 32.7 03/26/2022 1755   MCHC 34.8 03/26/2022 1755   RDW 12.2 03/26/2022 1755   RDW 11.9 10/26/2021 1001   LYMPHSABS 1.2 10/26/2021 1001   MONOABS 0.6 06/06/2021 1153   EOSABS 0.1 10/26/2021 1001   BASOSABS 0.0 10/26/2021 1001    ASSESSMENT AND PLAN: 1. Seizure [facility]) Patient to continue current dose of phenytoin and Keppra.  We will check phenytoin level today.  Went over with patient's nephew some of the signs/symptoms of grand mal seizure. - Phenytoin level, free and total  2. Vascular dementia with other behavioral disturbance, unspecified dementia severity [facility]) Advise nephew that short term >long term memory loss is classic of dementia. Continue [place] Advised the nephew that it can sometimes take several months for placement at a facility that has a memory unit.  Encouraged him to move forward with calling and visiting a few of the facilities if they still intend to seek placement for the patient.    Patient was given the opportunity to ask questions.  Patient verbalized understanding of the plan and was able to repeat key elements of the plan.   This documentation was completed using Radio producer.  Any transcriptional errors  are unintentional.  Orders Placed This Encounter  Procedures   Phenytoin level, free and total     Requested Prescriptions    No prescriptions requested or ordered in this encounter    No follow-ups on file.  Karle Plumber, MD, FACP

## 2022-05-04 ENCOUNTER — Telehealth: Payer: Self-pay | Admitting: Internal Medicine

## 2022-05-04 ENCOUNTER — Ambulatory Visit: Payer: Medicare Other | Admitting: Internal Medicine

## 2022-05-04 LAB — PHENYTOIN LEVEL, FREE AND TOTAL
Phenytoin, Free: 1.5 ug/mL (ref 1.0–2.0)
Phenytoin, Serum: 21.2 ug/mL (ref 10.0–20.0)

## 2022-05-04 NOTE — Telephone Encounter (Signed)
Phone call placed to patient's nephew Fintan Grater.  I informed him that patient's phenytoin level was just a smidge above the normal range.  At this time I recommend no change in the dose of the phenytoin.  He expressed understanding. Results for orders placed or performed in visit on 04/30/22  Phenytoin level, free and total  Result Value Ref Range   Phenytoin, Serum 21.2 (HH) 10.0 - 20.0 ug/mL   Phenytoin, Free 1.5 1.0 - 2.0 ug/mL

## 2022-05-25 ENCOUNTER — Encounter (HOSPITAL_BASED_OUTPATIENT_CLINIC_OR_DEPARTMENT_OTHER): Payer: Self-pay | Admitting: Emergency Medicine

## 2022-05-25 ENCOUNTER — Emergency Department (HOSPITAL_BASED_OUTPATIENT_CLINIC_OR_DEPARTMENT_OTHER)
Admission: EM | Admit: 2022-05-25 | Discharge: 2022-05-25 | Disposition: A | Discharge status: Home / Self Care | Payer: Medicare Other | Attending: Emergency Medicine | Admitting: Emergency Medicine

## 2022-05-25 ENCOUNTER — Other Ambulatory Visit: Payer: Self-pay

## 2022-05-25 DIAGNOSIS — F0390 Unspecified dementia without behavioral disturbance: Secondary | ICD-10-CM | POA: Insufficient documentation

## 2022-05-25 DIAGNOSIS — R410 Disorientation, unspecified: Secondary | ICD-10-CM | POA: Diagnosis not present

## 2022-05-25 DIAGNOSIS — Z79899 Other long term (current) drug therapy: Secondary | ICD-10-CM | POA: Diagnosis not present

## 2022-05-25 DIAGNOSIS — Z7982 Long term (current) use of aspirin: Secondary | ICD-10-CM | POA: Diagnosis not present

## 2022-05-25 DIAGNOSIS — R7889 Finding of other specified substances, not normally found in blood: Secondary | ICD-10-CM

## 2022-05-25 DIAGNOSIS — R4182 Altered mental status, unspecified: Secondary | ICD-10-CM | POA: Diagnosis present

## 2022-05-25 LAB — URINALYSIS, ROUTINE W REFLEX MICROSCOPIC
Bilirubin Urine: NEGATIVE
Glucose, UA: NEGATIVE mg/dL
Ketones, ur: NEGATIVE mg/dL
Leukocytes,Ua: NEGATIVE
Nitrite: NEGATIVE
Protein, ur: NEGATIVE mg/dL
Specific Gravity, Urine: 1.026 (ref 1.005–1.030)
pH: 5.5 (ref 5.0–8.0)

## 2022-05-25 LAB — CBC WITH DIFFERENTIAL/PLATELET
Abs Immature Granulocytes: 0.01 10*3/uL (ref 0.00–0.07)
Basophils Absolute: 0 10*3/uL (ref 0.0–0.1)
Basophils Relative: 0 %
Eosinophils Absolute: 0.1 10*3/uL (ref 0.0–0.5)
Eosinophils Relative: 2 %
HCT: 39.5 % (ref 39.0–52.0)
Hemoglobin: 13.7 g/dL (ref 13.0–17.0)
Immature Granulocytes: 0 %
Lymphocytes Relative: 19 %
Lymphs Abs: 0.9 10*3/uL (ref 0.7–4.0)
MCH: 33 pg (ref 26.0–34.0)
MCHC: 34.7 g/dL (ref 30.0–36.0)
MCV: 95.2 fL (ref 80.0–100.0)
Monocytes Absolute: 0.6 10*3/uL (ref 0.1–1.0)
Monocytes Relative: 13 %
Neutro Abs: 3.2 10*3/uL (ref 1.7–7.7)
Neutrophils Relative %: 66 %
Platelets: 151 10*3/uL (ref 150–400)
RBC: 4.15 MIL/uL — ABNORMAL LOW (ref 4.22–5.81)
RDW: 11.9 % (ref 11.5–15.5)
WBC: 4.9 10*3/uL (ref 4.0–10.5)
nRBC: 0 % (ref 0.0–0.2)

## 2022-05-25 LAB — PHENYTOIN LEVEL, TOTAL: Phenytoin Lvl: 25.9 ug/mL — ABNORMAL HIGH (ref 10.0–20.0)

## 2022-05-25 LAB — COMPREHENSIVE METABOLIC PANEL
ALT: 6 U/L (ref 0–44)
AST: 13 U/L — ABNORMAL LOW (ref 15–41)
Albumin: 4.3 g/dL (ref 3.5–5.0)
Alkaline Phosphatase: 137 U/L — ABNORMAL HIGH (ref 38–126)
Anion gap: 9 (ref 5–15)
BUN: 18 mg/dL (ref 8–23)
CO2: 28 mmol/L (ref 22–32)
Calcium: 9.5 mg/dL (ref 8.9–10.3)
Chloride: 104 mmol/L (ref 98–111)
Creatinine, Ser: 0.98 mg/dL (ref 0.61–1.24)
GFR, Estimated: >60 mL/min (ref >60)
Glucose, Bld: 78 mg/dL (ref 70–99)
Potassium: 4.5 mmol/L (ref 3.5–5.1)
Sodium: 141 mmol/L (ref 135–145)
Total Bilirubin: 0.3 mg/dL (ref 0.3–1.2)
Total Protein: 7.3 g/dL (ref 6.5–8.1)

## 2022-05-25 NOTE — ED Provider Notes (Signed)
  Physical Exam  BP 131/80   Pulse 74   Temp 98.3 F (36.8 C) (Oral)   Resp 15   Ht '5\' 7"'$  (1.702 m)   Wt 66 kg   SpO2 95%   BMI 22.79 kg/m   Physical Exam  Procedures  Procedures  ED Course / MDM    Medical Decision Making Amount and/or Complexity of Data Reviewed Labs: ordered.   Patient's phenytoin level is slightly elevated.  On exam he has mild nystagmus.  There is no nausea, vomiting and family denies any ataxia.  Patient complained of some left hip pain that is worse with movement.  We will defer management to the outpatient team.  Advised family to have him follow-up with PCP in 1 week specifically for phenytoin level recheck.  There are no new medications, therefore doubt drug interaction is an issue.  At this time he does not have any toxic symptoms.  Advised family to look for nausea, vomiting, ataxia and worsening, persistent confusion.       Varney Biles, MD 05/25/22 [phone]

## 2022-05-25 NOTE — ED Provider Notes (Signed)
[facility] EMERGENCY DEPT Provider Note   CSN: [id number] Arrival date & time: 05/25/22  0404     History  Chief Complaint  Patient presents with   Dementia    [name] is a 71 y.o. male.  The history is provided by a relative. The history is limited by the condition of the patient (Dementia).  He has history of stroke, primary biliary cirrhosis, hypertension, seizures and was brought in because he walked out of the house.  Family does not know how long he was gone, but when they found him, he was not recognizing family members.  They are concerned he may have had a seizure.  He is compliant with his anticonvulsant medications.   Home Medications Prior to Admission medications   Medication Sig Start Date End Date Taking? Authorizing Provider  aspirin EC 81 MG tablet Take 1 tablet (81 mg total) by mouth daily. 03/04/20   Ladell Pier, MD  levETIRAcetam (KEPPRA) 750 MG tablet TAKE 2 TABLETS (1,500 MG TOTAL) BY MOUTH 2 (TWO) TIMES DAILY. 01/02/22   Suzzanne Cloud, NP  memantine (NAMENDA) 10 MG tablet TAKE 1 TABLET BY MOUTH TWICE A DAY 12/06/21   Ladell Pier, MD  pantoprazole (PROTONIX) 40 MG tablet Take 1 tablet (40 mg total) by mouth daily. 12/25/21   Pyrtle, Lajuan Lines, MD  phenytoin (DILANTIN) 100 MG ER capsule Take 2 capsules (200 mg total) by mouth at bedtime. 02/19/22   Ladell Pier, MD  PHENYTOIN INFATABS 50 MG tablet CHEW 0.5 TABLETS (25 MG TOTAL) BY MOUTH AT BEDTIME. 03/13/22   Suzzanne Cloud, NP  simvastatin (ZOCOR) 20 MG tablet TAKE 1 TABLET BY MOUTH DAILY AT 6 PM. 01/23/22   Ladell Pier, MD  ursodiol (ACTIGALL) 250 MG tablet TAKE 2 TABLETS BY MOUTH IN THE MORNING AND AT BEDTIME. NEEDS ENDO/COLON FOR ADDITIONAL REFILLS 04/25/22   Pyrtle, Lajuan Lines, MD  ursodiol (ACTIGALL) 500 MG tablet Take 1 tablet (500 mg total) by mouth in the morning and at bedtime. 11/20/21   Noralyn Pick, NP      Allergies    Patient has no known allergies.     Review of Systems   Review of Systems  Unable to perform ROS: Dementia    Physical Exam Updated Vital Signs BP 101/66   Pulse 79   Temp 98.4 F (36.9 C)   Resp 18   Ht '5\' 7"'$  (1.702 m)   Wt 66 kg   SpO2 99%   BMI 22.79 kg/m  Physical Exam Vitals and nursing note reviewed.   71 year old male, resting comfortably and in no acute distress. Vital signs are normal. Oxygen saturation is 99%, which is normal. Head is normocephalic and atraumatic. PERRLA, EOMI. Oropharynx is clear. Neck is nontender and supple without adenopathy or JVD. Back is nontender and there is no CVA tenderness. Lungs are clear without rales, wheezes, or rhonchi. Chest is nontender. Heart has regular rate and rhythm without murmur. Abdomen is soft, flat, nontender. Extremities have no cyanosis or edema, full range of motion is present. Skin is warm and dry without rash. Neurologic: Awake and alert, oriented to person and place but not time, cranial nerves are intact, strength is 5/5 in all 4 extremities.  ED Results / Procedures / Treatments   Labs (all labs ordered are listed, but only abnormal results are displayed) Labs Reviewed  URINALYSIS, ROUTINE W REFLEX MICROSCOPIC  COMPREHENSIVE METABOLIC PANEL  CBC WITH DIFFERENTIAL/PLATELET  PHENYTOIN  LEVEL, TOTAL    EKG None  Radiology No results found.  Procedures Procedures    Medications Ordered in ED Medications - No data to display  ED Course/ Medical Decision Making/ A&P                           Medical Decision Making Amount and/or Complexity of Data Reviewed Labs: ordered.   Dementia with temporary worsening, possible occult seizure.  Patient now is able to recognize family members and is back to his baseline.  I have ordered laboratory work-up of CBC, comprehensive metabolic panel, phenytoin level, urinalysis.  Old records are reviewed, and he was seen in the emergency on 03/26/2022 with possible seizure.  Case is signed out to Dr.  Kathrynn Humble, oncoming physician.  Final Clinical Impression(s) / ED Diagnoses Final diagnoses:  Confusion    Rx / DC Orders ED Discharge Orders     None         Delora Fuel, MD 07/29/11 [phone]

## 2022-05-25 NOTE — ED Notes (Signed)
ED Provider at bedside. 

## 2022-05-25 NOTE — ED Notes (Signed)
Discharge instructions and follow up care explained to pt and caregiver. Pt and caregiver verbalized understanding and had no further questions on d/c.

## 2022-05-25 NOTE — ED Triage Notes (Signed)
Pt's sister states that the pt walked out of the house and was walking down the road. She states that he is confused, pt has dementia.

## 2022-05-25 NOTE — Discharge Instructions (Signed)
[name] lab results are reassuring besides his phenytoin level being slightly high.  We recommend that he follows up with his PCP in 1 week for repeat check of the phenytoin level.  For his pain, please make sure he is receiving Tylenol.  PCP and assessed the pain further if needed

## 2022-05-25 NOTE — ED Notes (Signed)
Patient's family given blankets and and hot tea.

## 2022-05-25 NOTE — ED Notes (Signed)
Late entry-- pt observed arriving to room via w/c - awake and alert; no obvious distress - escorted by charge nurse and family member

## 2022-06-21 ENCOUNTER — Ambulatory Visit: Payer: Medicare Other | Admitting: Internal Medicine

## 2022-07-02 ENCOUNTER — Emergency Department (HOSPITAL_COMMUNITY)
Admission: EM | Admit: 2022-07-02 | Discharge: 2022-07-02 | Disposition: A | Discharge status: Home / Self Care | Payer: Medicare Other | Attending: Emergency Medicine | Admitting: Emergency Medicine

## 2022-07-02 ENCOUNTER — Other Ambulatory Visit: Payer: Self-pay

## 2022-07-02 ENCOUNTER — Encounter (HOSPITAL_COMMUNITY): Payer: Self-pay | Admitting: Emergency Medicine

## 2022-07-02 DIAGNOSIS — F0390 Unspecified dementia without behavioral disturbance: Secondary | ICD-10-CM | POA: Diagnosis not present

## 2022-07-02 DIAGNOSIS — Z7982 Long term (current) use of aspirin: Secondary | ICD-10-CM | POA: Diagnosis not present

## 2022-07-02 DIAGNOSIS — Z743 Need for continuous supervision: Secondary | ICD-10-CM | POA: Diagnosis not present

## 2022-07-02 DIAGNOSIS — R404 Transient alteration of awareness: Secondary | ICD-10-CM | POA: Diagnosis not present

## 2022-07-02 DIAGNOSIS — R569 Unspecified convulsions: Secondary | ICD-10-CM | POA: Insufficient documentation

## 2022-07-02 LAB — COMPREHENSIVE METABOLIC PANEL
ALT: 16 U/L (ref 0–44)
AST: 21 U/L (ref 15–41)
Albumin: 3.8 g/dL (ref 3.5–5.0)
Alkaline Phosphatase: 143 U/L — ABNORMAL HIGH (ref 38–126)
Anion gap: 9 (ref 5–15)
BUN: 12 mg/dL (ref 8–23)
CO2: 26 mmol/L (ref 22–32)
Calcium: 8.8 mg/dL — ABNORMAL LOW (ref 8.9–10.3)
Chloride: 103 mmol/L (ref 98–111)
Creatinine, Ser: 0.97 mg/dL (ref 0.61–1.24)
GFR, Estimated: >60 mL/min (ref >60)
Glucose, Bld: 100 mg/dL — ABNORMAL HIGH (ref 70–99)
Potassium: 4.1 mmol/L (ref 3.5–5.1)
Sodium: 138 mmol/L (ref 135–145)
Total Bilirubin: 0.6 mg/dL (ref 0.3–1.2)
Total Protein: 7.1 g/dL (ref 6.5–8.1)

## 2022-07-02 LAB — CBC WITH DIFFERENTIAL/PLATELET
Abs Immature Granulocytes: 0.02 10*3/uL (ref 0.00–0.07)
Basophils Absolute: 0 10*3/uL (ref 0.0–0.1)
Basophils Relative: 0 %
Eosinophils Absolute: 0.1 10*3/uL (ref 0.0–0.5)
Eosinophils Relative: 2 %
HCT: 40.4 % (ref 39.0–52.0)
Hemoglobin: 14.2 g/dL (ref 13.0–17.0)
Immature Granulocytes: 0 %
Lymphocytes Relative: 20 %
Lymphs Abs: 0.9 10*3/uL (ref 0.7–4.0)
MCH: 33.6 pg (ref 26.0–34.0)
MCHC: 35.1 g/dL (ref 30.0–36.0)
MCV: 95.5 fL (ref 80.0–100.0)
Monocytes Absolute: 0.5 10*3/uL (ref 0.1–1.0)
Monocytes Relative: 11 %
Neutro Abs: 3 10*3/uL (ref 1.7–7.7)
Neutrophils Relative %: 67 %
Platelets: 150 10*3/uL (ref 150–400)
RBC: 4.23 MIL/uL (ref 4.22–5.81)
RDW: 12 % (ref 11.5–15.5)
WBC: 4.5 10*3/uL (ref 4.0–10.5)
nRBC: 0 % (ref 0.0–0.2)

## 2022-07-02 LAB — URINALYSIS, ROUTINE W REFLEX MICROSCOPIC
Bilirubin Urine: NEGATIVE
Glucose, UA: NEGATIVE mg/dL
Hgb urine dipstick: NEGATIVE
Ketones, ur: NEGATIVE mg/dL
Leukocytes,Ua: NEGATIVE
Nitrite: NEGATIVE
Protein, ur: NEGATIVE mg/dL
Specific Gravity, Urine: 1.015 (ref 1.005–1.030)
pH: 7 (ref 5.0–8.0)

## 2022-07-02 LAB — PHENYTOIN LEVEL, TOTAL: Phenytoin Lvl: 18.2 ug/mL (ref 10.0–20.0)

## 2022-07-02 MED ORDER — LEVETIRACETAM 500 MG PO TABS
1500.0000 mg | ORAL_TABLET | Freq: Once | ORAL | Status: AC
Start: 2022-07-02 — End: 2022-07-02
  Administered 2022-07-02: 1500 mg via ORAL
  Filled 2022-07-02: qty 3

## 2022-07-02 NOTE — ED Triage Notes (Signed)
Presents from home with EMS for seizure  lasting 5 min at home, was lying on an air mattress on floor at time of seizure, no known injuries. EMS arrives to pt postictal, emesis pta. Received '5mg'$  versed IM for combative behavior. Takes keppra and dilantin with no known missed doses.  EMS VS: 126/76, NSR 86, 96% RA, [id number]

## 2022-07-02 NOTE — ED Provider Notes (Signed)
[facility] EMERGENCY DEPARTMENT Provider Note   CSN: [id number] Arrival date & time: 07/02/22  5916     History  Chief Complaint  Patient presents with   Seizures    [name] is a 71 y.o. male.   Seizures    71 year old male with medical history significant for alcoholism, CVA, dementia, seizures, hepatitis C who presents to the emergency department  after  reported seizure. The history is limited as the patient has dementia and there were no family members beside for additional HPI. The patient reportedly was found by EMS after a seizure lasting 5 minutes at home. He was found postictal lying on an air mattress at home. No known injuries. Not on anticoagulation. Per EMS, patient was post-ictal and had an episode of emesis prior to arrival. He received Versed IM for agitated behavior. Reportedly on Keppra and Dilantin with no known missed doses of medications. CBG with EMS 118.   Home Medications Prior to Admission medications   Medication Sig Start Date End Date Taking? Authorizing Provider  aspirin EC 81 MG tablet Take 1 tablet (81 mg total) by mouth daily. 03/04/20   Ladell Pier, MD  levETIRAcetam (KEPPRA) 750 MG tablet TAKE 2 TABLETS (1,500 MG TOTAL) BY MOUTH 2 (TWO) TIMES DAILY. 01/02/22   Suzzanne Cloud, NP  memantine (NAMENDA) 10 MG tablet TAKE 1 TABLET BY MOUTH TWICE A DAY 12/06/21   Ladell Pier, MD  pantoprazole (PROTONIX) 40 MG tablet Take 1 tablet (40 mg total) by mouth daily. 12/25/21   Pyrtle, Lajuan Lines, MD  phenytoin (DILANTIN) 100 MG ER capsule Take 2 capsules (200 mg total) by mouth at bedtime. 02/19/22   Ladell Pier, MD  PHENYTOIN INFATABS 50 MG tablet CHEW 0.5 TABLETS (25 MG TOTAL) BY MOUTH AT BEDTIME. 03/13/22   Suzzanne Cloud, NP  simvastatin (ZOCOR) 20 MG tablet TAKE 1 TABLET BY MOUTH DAILY AT 6 PM. 01/23/22   Ladell Pier, MD  ursodiol (ACTIGALL) 250 MG tablet TAKE 2 TABLETS BY MOUTH IN THE MORNING AND AT BEDTIME. NEEDS  ENDO/COLON FOR ADDITIONAL REFILLS 04/25/22   Pyrtle, Lajuan Lines, MD  ursodiol (ACTIGALL) 500 MG tablet Take 1 tablet (500 mg total) by mouth in the morning and at bedtime. 11/20/21   Noralyn Pick, NP      Allergies    Patient has no known allergies.    Review of Systems   Review of Systems  Unable to perform ROS: Dementia  Neurological:  Positive for seizures.    Physical Exam Updated Vital Signs BP (!) 108/58   Pulse 69   Temp 97.8 F (36.6 C) (Oral)   Resp 17   Ht '5\' 7"'$  (1.702 m)   Wt 66 kg   SpO2 96%   BMI 22.79 kg/m  Physical Exam Vitals and nursing note reviewed.  Constitutional:      General: He is not in acute distress.    Comments: GCS 14, AAOx1 (recognized he is in a hospital)  HENT:     Head: Normocephalic and atraumatic.  Eyes:     Conjunctiva/sclera: Conjunctivae normal.     Pupils: Pupils are equal, round, and reactive to light.  Cardiovascular:     Rate and Rhythm: Normal rate and regular rhythm.  Pulmonary:     Effort: Pulmonary effort is normal. No respiratory distress.  Abdominal:     General: There is no distension.     Tenderness: There is no guarding.  Musculoskeletal:  General: No deformity or signs of injury.     Cervical back: Neck supple.  Skin:    Findings: No lesion or rash.  Neurological:     Mental Status: He is alert.     Comments: MENTAL STATUS EXAM:    Orientation: Alert and oriented to person, disoriented to place and time. Memory: Cooperative, follows commands well.  Language: Speech is clear and language is normal.   CRANIAL NERVES:    CN 2 (Optic): Visual fields intact to confrontation.  CN 3,4,6 (EOM): Pupils equal and reactive to light. Full extraocular eye movement without nystagmus.  CN 5 (Trigeminal): Facial sensation is normal, no weakness of masticatory muscles.  CN 7 (Facial): No facial weakness or asymmetry.  CN 8 (Auditory): Auditory acuity grossly normal.  CN 9,10 (Glossophar): The uvula is midline,  the palate elevates symmetrically.  CN 11 (spinal access): Normal sternocleidomastoid and trapezius strength.  CN 12 (Hypoglossal): The tongue is midline. No atrophy or fasciculations.Marland Kitchen   MOTOR:  Muscle Strength: 5/5RUE, 4/5LUE, 5/5RLE, 4/5LLE.   COORDINATION:   No tremor.   SENSATION:   Intact to light touch all four extremities.       ED Results / Procedures / Treatments   Labs (all labs ordered are listed, but only abnormal results are displayed) Labs Reviewed  COMPREHENSIVE METABOLIC PANEL - Abnormal; Notable for the following components:      Result Value   Glucose, Bld 100 (*)    Calcium 8.8 (*)    Alkaline Phosphatase 143 (*)    All other components within normal limits  PHENYTOIN LEVEL, TOTAL  CBC WITH DIFFERENTIAL/PLATELET  URINALYSIS, ROUTINE W REFLEX MICROSCOPIC  CBG MONITORING, ED    EKG EKG Interpretation  Date/Time:  Monday July 02 2022 10:32:48 EDT Ventricular Rate:  76 PR Interval:  198 QRS Duration: 88 QT Interval:  362 QTC Calculation: 407 R Axis:   66 Text Interpretation: Sinus rhythm Low voltage, precordial leads Confirmed by Regan Lemming ([id number]) on 07/02/2022 10:44:59 AM  Radiology No results found.  Procedures Procedures    Medications Ordered in ED Medications  levETIRAcetam (KEPPRA) tablet 1,500 mg (1,500 mg Oral Given 07/02/22 1032)    ED Course/ Medical Decision Making/ A&P                           Medical Decision Making Amount and/or Complexity of Data Reviewed Labs: ordered.  Risk Prescription drug management.    71 year old male with medical history significant for alcoholism, CVA, dementia, seizures, hepatitis C who presents to the emergency department  after  reported seizure. The history is limited as the patient has dementia and there were no family members beside for additional HPI. The patient reportedly was found by EMS after a seizure lasting 5 minutes at home. He was found postictal lying on an air mattress at  home. No known injuries. Not on anticoagulation. Per EMS, patient was post-ictal and had an episode of emesis prior to arrival. He received Versed IM for agitated behavior. Reportedly on Keppra and Dilantin with no known missed doses of medications. CBG with EMS 118.   On arrival, the patient was vitally stable. NSR noted on cardiac tele. Physical exam significant for a neuro exam at baseline with the patient AAOx1, demented, mild LHB weakness on exam which is chronic from an old CVA. No immediate family bedside to confirm patient is back to baseline. Reportedly postictal with EMS, more alert now.   Laboratory  evaluation significant for a therapeutic phenytoin level, CMP unremarkable, CBC unremarkable, urinalysis without evidence of UTI.  Family numbers arrived and confirmed that the patient is back to his baseline.  He had not received his home Keppra and this was ordered and administered.  The patient was back to his baseline mental status, no acute neurologic deficits, no further seizure episodes over 4 hours of observation in the emergency department, stable laboratory work-up, overall stable for discharge with outpatient neuro follow-up.   Final Clinical Impression(s) / ED Diagnoses Final diagnoses:  Seizure [facility])    Rx / [place] Orders ED Discharge Orders     None         Regan Lemming, MD 07/02/22 1046

## 2022-07-02 NOTE — Discharge Instructions (Addendum)
Department of Motor Vehicle (DMV) of [place] regulations for seizures - It is the patient's responsibility to report the incidence of the seizure in the state of [place]. [place] has no statutory provision requiring physicians to report patients diagnosed with epilepsy or seizures to a central state agency.  The recommended DMV regulation requirement for a driver in [place] for an individual with a seizure is that they be seizure-free for 6-12 months. However, the DMV may consider the following exceptions to this general rule where: (1) a physician-directed change in medication causes a seizure and the individual immediately resumes the previous therapy which controlled seizures; (2) there is a history of nocturnal seizures or seizures which do not involve loss of consciousness, loss of control of motor function, or loss of appropriate sensation and information process; and (3) an individual has a seizure disorder preceded by an aura (warning) lasting 2-3 minutes. While the DMV may also give consideration to other unusual circumstances which may affect the general requirement that drivers be seizure-free for 6-12 months, interpretation of these circumstances and assignment of restrictions is at the discretion of the Medical Advisor. The DMV also considers compliance with medical therapy essential for safe driving. [The  Physician's Guide to Driver Medical Evaluation (June, 1995 ed.)] The Department learns of an individual's condition by inquiring on the application form or renewal form, a physician's report to the DMV, an accident report or from correspondence from the individual. The person may be required to submit a Medical Report Form either annually or semi-annually.  

## 2022-07-19 ENCOUNTER — Other Ambulatory Visit: Payer: Self-pay | Admitting: Internal Medicine

## 2022-07-19 MED ORDER — MEMANTINE HCL 10 MG PO TABS: 10.0000 mg | ORAL_TABLET | Freq: Two times a day (BID) | ORAL | 1 refills | Status: DC

## 2022-07-19 NOTE — Telephone Encounter (Signed)
Medication Refill - Medication: memantine (NAMENDA) 10 MG tablet  Has the patient contacted their pharmacy? No.  Preferred Pharmacy (with phone number or street name):  [id number]:  [phone] Fax:  [phone]     Has the patient been seen for an appointment in the last year OR does the patient have an upcoming appointment? Yes.    Agent: Please be advised that RX refills may take up to 3 business days. We ask that you follow-up with your pharmacy.

## 2022-07-19 NOTE — Telephone Encounter (Signed)
Requested Prescriptions  Pending Prescriptions Disp Refills  . memantine (NAMENDA) 10 MG tablet 180 tablet 1    Sig: Take 1 tablet (10 mg total) by mouth 2 (two) times daily.     Neurology:  Alzheimer's Agents 2 Passed - 07/19/2022 10:48 AM      Passed - Cr in normal range and within 360 days    Creat  Date Value Ref Range Status  04/08/2017 0.93 0.70 - 1.25 mg/dL Final    Comment:      For patients > or = 69 years of age: The upper reference limit for Creatinine is approximately 13% higher for people identified as African-American.      Creatinine, Ser  Date Value Ref Range Status  07/02/2022 0.97 0.61 - 1.24 mg/dL Final         Passed - eGFR is 5 or above and within 360 days    GFR, Est African American  Date Value Ref Range Status  04/08/2017 >89 >=60 mL/min Final   GFR calc Af Amer  Date Value Ref Range Status  07/25/2020 >60 >60 mL/min Final   GFR, Est Non African American  Date Value Ref Range Status  04/08/2017 86 >=60 mL/min Final   GFR, Estimated  Date Value Ref Range Status  07/02/2022 >60 >60 mL/min Final    Comment:    (NOTE) Calculated using the CKD-EPI Creatinine Equation (2021)    GFR  Date Value Ref Range Status  06/15/2020 94.35 >60.00 mL/min Final   eGFR  Date Value Ref Range Status  11/21/2021 94 >59 mL/min/1.73 Final         Passed - Valid encounter within last 6 months    Recent Outpatient Visits          2 months ago Seizure [facility])   [place], MD   5 months ago Essential hypertension   [place], MD   8 months ago Seizure [facility])   [place], [place], [place]   1 year ago Need for immunization against influenza   [place], Jarome Matin, RPH-CPP   1 year ago Essential hypertension   [place] Ladell Pier, MD

## 2022-08-10 ENCOUNTER — Other Ambulatory Visit: Payer: Self-pay | Admitting: Internal Medicine

## 2022-08-10 DIAGNOSIS — E785 Hyperlipidemia, unspecified: Secondary | ICD-10-CM

## 2022-09-02 ENCOUNTER — Other Ambulatory Visit: Payer: Self-pay | Admitting: Internal Medicine

## 2022-09-02 ENCOUNTER — Other Ambulatory Visit: Payer: Self-pay | Admitting: Neurology

## 2022-09-02 ENCOUNTER — Other Ambulatory Visit: Payer: Self-pay | Admitting: Physician Assistant

## 2022-09-02 DIAGNOSIS — R569 Unspecified convulsions: Secondary | ICD-10-CM

## 2022-09-02 DIAGNOSIS — E785 Hyperlipidemia, unspecified: Secondary | ICD-10-CM

## 2022-09-02 DIAGNOSIS — I10 Essential (primary) hypertension: Secondary | ICD-10-CM

## 2022-09-03 NOTE — Telephone Encounter (Signed)
Medication d/c'd not on med list. Requested Prescriptions  Pending Prescriptions Disp Refills   metoprolol succinate (TOPROL-XL) 25 MG 24 hr tablet [Pharmacy Med Name: METOPROLOL SUCC ER 25 MG TAB] 90 tablet 1    Sig: TAKE 1 TABLET (25 MG TOTAL) BY MOUTH DAILY.     Cardiovascular:  Beta Blockers Passed - 09/02/2022 12:27 PM      Passed - Last BP in normal range    BP Readings from Last 1 Encounters:  07/02/22 106/60         Passed - Last Heart Rate in normal range    Pulse Readings from Last 1 Encounters:  07/02/22 78         Passed - Valid encounter within last 6 months    Recent Outpatient Visits           4 months ago Seizure [facility])   [place], MD   6 months ago Essential hypertension   [place], MD   10 months ago Seizure [facility])   [place], [place], [place]   1 year ago Need for immunization against influenza   [place], RPH-CPP   1 year ago Essential hypertension   [place] Ladell Pier, MD

## 2022-09-03 NOTE — Telephone Encounter (Signed)
Unable to refill per protocol, Rx request is too soon. Last refill 08/10/22 for 90. Will refuse.  Requested Prescriptions  Pending Prescriptions Disp Refills   simvastatin (ZOCOR) 20 MG tablet [Pharmacy Med Name: SIMVASTATIN 20 MG TABLET] 90 tablet 0    Sig: TAKE 1 TABLET BY MOUTH DAILY AT 6 PM.     Cardiovascular:  Antilipid - Statins Failed - 09/02/2022 12:27 PM      Failed - Lipid Panel in normal range within the last 12 months    Cholesterol, Total  Date Value Ref Range Status  04/03/2021 144 100 - 199 mg/dL Final   LDL Chol Calc (NIH)  Date Value Ref Range Status  04/03/2021 93 0 - 99 mg/dL Final   HDL  Date Value Ref Range Status  04/03/2021 35 (L) >39 mg/dL Final   Triglycerides  Date Value Ref Range Status  04/03/2021 85 0 - 149 mg/dL Final         Passed - Patient is not pregnant      Passed - Valid encounter within last 12 months    Recent Outpatient Visits           4 months ago Seizure [facility])   [place], MD   6 months ago Essential hypertension   [place], MD   10 months ago Seizure [facility])   [place], [place], [place]   1 year ago Need for immunization against influenza   [place], Jarome Matin, RPH-CPP   1 year ago Essential hypertension   [place] Ladell Pier, MD

## 2022-09-08 ENCOUNTER — Other Ambulatory Visit: Payer: Self-pay

## 2022-09-08 ENCOUNTER — Emergency Department (HOSPITAL_COMMUNITY)
Admission: EM | Admit: 2022-09-08 | Discharge: 2022-09-09 | Disposition: A | Discharge status: Home / Self Care | Payer: Medicare Other | Attending: Emergency Medicine | Admitting: Emergency Medicine

## 2022-09-08 ENCOUNTER — Encounter (HOSPITAL_COMMUNITY): Payer: Self-pay | Admitting: Emergency Medicine

## 2022-09-08 DIAGNOSIS — R569 Unspecified convulsions: Secondary | ICD-10-CM | POA: Diagnosis not present

## 2022-09-08 DIAGNOSIS — R0689 Other abnormalities of breathing: Secondary | ICD-10-CM | POA: Diagnosis not present

## 2022-09-08 DIAGNOSIS — Z7982 Long term (current) use of aspirin: Secondary | ICD-10-CM | POA: Insufficient documentation

## 2022-09-08 DIAGNOSIS — Z743 Need for continuous supervision: Secondary | ICD-10-CM | POA: Diagnosis not present

## 2022-09-08 DIAGNOSIS — G40909 Epilepsy, unspecified, not intractable, without status epilepticus: Secondary | ICD-10-CM | POA: Diagnosis not present

## 2022-09-08 DIAGNOSIS — R6889 Other general symptoms and signs: Secondary | ICD-10-CM | POA: Diagnosis not present

## 2022-09-08 DIAGNOSIS — F0390 Unspecified dementia without behavioral disturbance: Secondary | ICD-10-CM | POA: Diagnosis not present

## 2022-09-08 LAB — BASIC METABOLIC PANEL
Anion gap: 8 (ref 5–15)
BUN: 12 mg/dL (ref 8–23)
CO2: 28 mmol/L (ref 22–32)
Calcium: 9.1 mg/dL (ref 8.9–10.3)
Chloride: 102 mmol/L (ref 98–111)
Creatinine, Ser: 0.96 mg/dL (ref 0.61–1.24)
GFR, Estimated: >60 mL/min (ref >60)
Glucose, Bld: 101 mg/dL — ABNORMAL HIGH (ref 70–99)
Potassium: 4.3 mmol/L (ref 3.5–5.1)
Sodium: 138 mmol/L (ref 135–145)

## 2022-09-08 LAB — CBC WITH DIFFERENTIAL/PLATELET
Abs Immature Granulocytes: 0.01 10*3/uL (ref 0.00–0.07)
Basophils Absolute: 0 10*3/uL (ref 0.0–0.1)
Basophils Relative: 0 %
Eosinophils Absolute: 0.1 10*3/uL (ref 0.0–0.5)
Eosinophils Relative: 2 %
HCT: 40.3 % (ref 39.0–52.0)
Hemoglobin: 13.8 g/dL (ref 13.0–17.0)
Immature Granulocytes: 0 %
Lymphocytes Relative: 18 %
Lymphs Abs: 1.1 10*3/uL (ref 0.7–4.0)
MCH: 32.9 pg (ref 26.0–34.0)
MCHC: 34.2 g/dL (ref 30.0–36.0)
MCV: 96 fL (ref 80.0–100.0)
Monocytes Absolute: 0.5 10*3/uL (ref 0.1–1.0)
Monocytes Relative: 9 %
Neutro Abs: 4.1 10*3/uL (ref 1.7–7.7)
Neutrophils Relative %: 71 %
Platelets: 156 10*3/uL (ref 150–400)
RBC: 4.2 MIL/uL — ABNORMAL LOW (ref 4.22–5.81)
RDW: 11.8 % (ref 11.5–15.5)
WBC: 5.8 10*3/uL (ref 4.0–10.5)
nRBC: 0 % (ref 0.0–0.2)

## 2022-09-08 LAB — PHENYTOIN LEVEL, TOTAL: Phenytoin Lvl: 7.9 ug/mL — ABNORMAL LOW (ref 10.0–20.0)

## 2022-09-08 MED ORDER — PHENYTOIN SODIUM EXTENDED 100 MG PO CAPS: 200.0000 mg | ORAL_CAPSULE | Freq: Every day | ORAL | 0 refills | Status: DC

## 2022-09-08 MED ORDER — PHENYTOIN SODIUM EXTENDED 100 MG PO CAPS
400.0000 mg | ORAL_CAPSULE | Freq: Once | ORAL | Status: AC
  Administered 2022-09-08: 400 mg via ORAL
  Filled 2022-09-08: qty 4

## 2022-09-08 MED ORDER — LEVETIRACETAM 500 MG PO TABS
1500.0000 mg | ORAL_TABLET | Freq: Once | ORAL | Status: AC
  Administered 2022-09-08: 1500 mg via ORAL
  Filled 2022-09-08: qty 3

## 2022-09-08 NOTE — Discharge Instructions (Addendum)
You were seen in the emergency department for seizure.  Your work-up today was reassuring against emergency cause of your seizure.  Your seizure was likely caused by running out of your phenytoin.  We gave you a double dose of your phenytoin while here, as well as her nightly dose of Keppra.  We have given you a prescription for refill of your phenytoin.  Please continue to take this as prescribed.  Please make an appointment to follow-up with your neurologist Dr. April Manson as soon as possible to further discuss your seizure medications.  Please return emergency department if you have another seizure that does not stop after longer than 5 minutes, if you do not return to baseline after seizures, if you develop fevers or severe headaches, or if you have any other reason to think you need emergency care.  We hope you feel better soon.

## 2022-09-08 NOTE — ED Provider Notes (Signed)
[facility] EMERGENCY DEPARTMENT Provider Note   CSN: [id number] Arrival date & time: 09/08/22  2057     History  Chief Complaint  Patient presents with   Seizures    [name] is a 71 y.o. male with history of alcoholism, CVA, dementia, seizures, hepatitis C who presents to the emergency department for seizure.  The patient's nephew at bedside states that he witnessed the patient having a full body tonic-clonic seizure this evening.  He states that he thinks it lasted close to 10 minutes.  The patient was confused for some time afterwards, but is slowly become more alert and is now back to baseline.  He is currently taking Keppra and Dilantin for seizures, but he has been out of one of the medications for the last 1 week.  They have been trying to refill it, but has been unsuccessful in getting a refill so far.  His last seizure before today was on September 11, he was seen here at that time as well.  He has not had any fevers, falls or trauma, or other changes in health.  The patient states that he feels well now, has no headache and feels like his normal self.  The patient's nephew states that he has returned to baseline.  The history is provided by the patient, a relative and medical records.  Seizures      Home Medications Prior to Admission medications   Medication Sig Start Date End Date Taking? Authorizing Provider  levETIRAcetam (KEPPRA) 750 MG tablet TAKE 2 TABLETS (1,500 MG TOTAL) BY MOUTH 2 (TWO) TIMES DAILY. 01/02/22  Yes Suzzanne Cloud, NP  memantine (NAMENDA) 10 MG tablet Take 1 tablet (10 mg total) by mouth 2 (two) times daily. 07/19/22  Yes Ladell Pier, MD  PHENYTOIN INFATABS 50 MG tablet CHEW 0.5 TABLETS (25 MG TOTAL) BY MOUTH AT BEDTIME. 03/13/22  Yes Suzzanne Cloud, NP  simvastatin (ZOCOR) 20 MG tablet TAKE 1 TABLET BY MOUTH DAILY AT 6 PM. 08/10/22  Yes Ladell Pier, MD  aspirin EC 81 MG tablet Take 1 tablet (81 mg total) by mouth  daily. Patient not taking: Reported on 09/08/2022 03/04/20   Ladell Pier, MD  pantoprazole (PROTONIX) 40 MG tablet Take 1 tablet (40 mg total) by mouth daily. Patient not taking: Reported on 09/08/2022 12/25/21   Jerene Bears, MD  phenytoin (DILANTIN) 100 MG ER capsule Take 2 capsules (200 mg total) by mouth at bedtime. 09/08/22   Renard Matter, MD  ursodiol (ACTIGALL) 250 MG tablet TAKE 2 TABLETS BY MOUTH IN THE MORNING AND AT BEDTIME. NEEDS ENDO/COLON FOR ADDITIONAL REFILLS Patient not taking: Reported on 09/08/2022 04/25/22   Pyrtle, Lajuan Lines, MD  ursodiol (ACTIGALL) 500 MG tablet Take 1 tablet (500 mg total) by mouth in the morning and at bedtime. Patient not taking: Reported on 09/08/2022 11/20/21   Noralyn Pick, NP      Allergies    Patient has no known allergies.    Review of Systems   Review of Systems  Neurological:  Positive for seizures.    Physical Exam Updated Vital Signs BP 121/68   Pulse (!) 59   Temp 98.6 F (37 C) (Oral)   Resp 14   SpO2 99%   Physical Exam Physical Exam:  General: No acute distress.  Eyes: Pupils equally round, reactive to light.  Cardiovascular: Regular rate and rhythm per auscultation.  Respiratory: Normal respirations. Lungs clear to auscultation bilaterally.  GI: Soft, nontender.  Skin: well perfused per inspection, no edema.   Mental Status Exam:  Orientation: Alert and oriented to person and "hospital", not to year which is baseline per nephew at bedside with dementia.  Memory: Cooperative, follows commands well.   Cranial Nerves:   CN 2 (Optic): Visual fields intact to confrontation. CN 3,4,6 (EOM): Pupils equal and reactive to light. Full extraocular eye movement without nystagmus.  CN 5 (Trigeminal): Facial sensation is normal, no weakness of masticatory muscles.  CN 7 (Facial): No facial weakness or asymmetry.  CN 8 (Auditory): Auditory acuity grossly normal.  CN 9,10 (Glossophar): The uvula is midline, the  palate elevates symmetrically.  CN 11 (spinal access): Normal sternocleidomastoid and trapezius strength.  CN 12 (Hypoglossal): The tongue is midline. No atrophy or fasciculations.  Motor: Strength: 5/5 and symmetric in the upper and lower extremities.  Sensation: Intact to light touch throughout.    ED Results / Procedures / Treatments   Labs (all labs ordered are listed, but only abnormal results are displayed) Labs Reviewed  CBC WITH DIFFERENTIAL/PLATELET - Abnormal; Notable for the following components:      Result Value   RBC 4.20 (*)    All other components within normal limits  BASIC METABOLIC PANEL - Abnormal; Notable for the following components:   Glucose, Bld 101 (*)    All other components within normal limits  PHENYTOIN LEVEL, TOTAL - Abnormal; Notable for the following components:   Phenytoin Lvl 7.9 (*)    All other components within normal limits    EKG EKG Interpretation  Date/Time:  Saturday September 08 2022 21:04:52 EST Ventricular Rate:  58 PR Interval:  218 QRS Duration: 98 QT Interval:  409 QTC Calculation: 402 R Axis:   71 Text Interpretation: Sinus rhythm Borderline prolonged PR interval Borderline ST elevation, anterior leads when compared to prior,  ocerall similar appearnce. No STEMI Confirmed by Antony Blackbird [phone]) on 09/08/2022 9:51:07 PM  Radiology No results found.  Procedures Procedures    Medications Ordered in ED Medications  phenytoin (DILANTIN) ER capsule 400 mg (has no administration in time range)  levETIRAcetam (KEPPRA) tablet 1,500 mg (has no administration in time range)    ED Course/ Medical Decision Making/ A&P                           Medical Decision Making Problems Addressed: Seizure [facility]): complicated acute illness or injury  Amount and/or Complexity of Data Reviewed Independent Historian: caregiver and EMS External Data Reviewed: notes. Labs: ordered. Decision-making details documented in ED  Course. ECG/medicine tests: ordered and independent interpretation performed. Decision-making details documented in ED Course.  Risk Prescription drug management.    Brief Overview [name] is a 71 y.o. male who presents with seizure-like activity as per above.  I have reviewed the nursing documentation for past medical history, family history, and social history and agree.  I have reviewed the patient's vital signs, which are unremarkable.  On chart review, patient was seen in the ED on 9/11 for seizure as well.  Patient was on Keppra and Dilantin at that time, ultimately discharged back home after return to baseline.  Initial Differential Diagnoses: I am most concerned for seizure given prior history of seizure, reports of generalized tonic clonic activity, and post-ictal state.   I do not think the patient's loss of consciousness was caused by syncope given not quick to arouse afterward, and presentation more consistent with  seizure for above reasons. Specifically, do not think ACS causing syncope is present as patient without chest pain or shortness of breath, however will obtain EKG to further evaluate. Additionally do not suspect PE causing syncope as patient without shortness of breath, hypoxia, tachycardia, or signs of DVT on exam. No evidence of PTX or aortic dissection on physical exam (equal breath sounds, equal pulses, no chest pain, no focal neurologic deficits). Low suspicion for arrhythmia, however will obtain EKG. No neurologic deficits to suggest stroke.   With regards to cause of the patient's suspected seizure today, initial concern highest for missed AED doses. I also considered infection, trauma, toxic ingestion, alcohol withdrawal, CNS lesion, however patient without fever to suggest infection and specifically without neck stiffness, rash, or other meningeal signs on exam to suggest meningitis; no known history of trauma or evidence of traumatic injury on exam; no  focal neurologic deficits to suggest CNS lesion; no history of overdose or reports of concern for ingestion; and no no significant alcohol use to suggest withdrawal seizure or signs of withdrawal on exam.   ED Course Patient well-appearing on my initial evaluation, alert and oriented to person and place which is his baseline per family at bedside.  No focal neurologic deficits.  No seizure-like activity noted.  Patient's family initially not sure which seizure medication he had been out of, but was later able to identify that he has been out of his Dilantin capsules (200 mg nightly).  He is still been taking his Keppra and the chewable Dilantin as prescribed.  Labs revealed: Glucose within normal limits at 101, no electrolyte derangements.  Creatinine within normal limits.  CBC without leukocytosis to suggest significant infection, no anemia.  Phenytoin level is low, consistent with missed doses.  I interpreted the ECG. It reveals a sinus rhythm, 58 bpm. The QTc, PR, and QRS are appropriate.  Diffuse upsloping ST segments consistent with benign early repolarization.  No ST depressions or T wave inversions to suggest acute ischemia.  No STEMI.  No high-grade conduction block or arrhythmia.  Given not first time seizure and no focal neurologic deficits on exam, and no recent falls or trauma, CT head not obtained.  Patient given double dose of Dilantin in ED and his nightly dose of Keppra.  He had no recurrent seizures while in the ED, remained at neurologic baseline with stable vital signs.  I believe that the patient is safe for discharge home with outpatient neurology followup. We discussed following up with Neurology in their clinic, and clinic phone number was provided.  I provided a refill of the phenytoin prescription that the patient ran out of. I provided thorough ED return precautions.  The patient's family feels safe and comfortable with this plan. All questions answered. Patient discharged in  stable condition.         Final Clinical Impression(s) / ED Diagnoses Final diagnoses:  Seizure [facility])    Rx / DC Orders ED Discharge Orders          Ordered    phenytoin (DILANTIN) 100 MG ER capsule  Daily at bedtime        09/08/22 2313              Renard Matter, MD 09/08/22 2321    Tegeler, Gwenyth Allegra, MD 09/10/22 1557

## 2022-09-08 NOTE — ED Triage Notes (Signed)
Pt BIB [facility] from home, hx dementia and seizures. Pt had witnessed seizure by family. Initially post-ictal, now back to his baseline.

## 2022-09-19 ENCOUNTER — Telehealth: Payer: Self-pay | Admitting: *Deleted

## 2022-10-29 ENCOUNTER — Ambulatory Visit: Payer: Medicare Other | Admitting: Podiatry

## 2022-11-09 ENCOUNTER — Telehealth: Payer: Self-pay | Admitting: Internal Medicine

## 2022-11-09 NOTE — Telephone Encounter (Signed)
Left message for patient to call back and schedule Medicare Annual Wellness Visit (AWV) either virtually or phone  . Left  my Herbie Drape number [phone]   Last AWV 06/29/21

## 2022-11-14 ENCOUNTER — Ambulatory Visit: Payer: 59 | Admitting: Critical Care Medicine

## 2022-11-14 NOTE — Progress Notes (Deleted)
   Established Patient Office Visit  Subjective   Patient ID: [name], male    DOB: Mar 30, 1951  Age: 72 y.o. MRN: [id number]  No chief complaint on file.   HPI  {History (Optional):23778}  ROS    Objective:     There were no vitals taken for this visit. {Vitals History (Optional):23777}  Physical Exam   No results found for any visits on 11/14/22.  {Labs (Optional):23779}  The ASCVD Risk score (Arnett DK, et al., 2019) failed to calculate for the following reasons:   The patient has a prior MI or stroke diagnosis    Assessment & Plan:   Problem List Items Addressed This Visit   None   No follow-ups on file.    Asencion Noble, MD

## 2022-11-21 ENCOUNTER — Ambulatory Visit: Payer: Medicare Other | Admitting: Neurology

## 2022-11-22 ENCOUNTER — Other Ambulatory Visit: Payer: Self-pay | Admitting: Internal Medicine

## 2022-11-22 MED ORDER — PHENYTOIN 50 MG PO CHEW: CHEWABLE_TABLET | ORAL | 3 refills | Status: DC

## 2022-11-22 NOTE — Telephone Encounter (Signed)
Requested medication (s) are due for refill today - yes  Requested medication (s) are on the active medication list -yes  Future visit scheduled -yes  Last refill: 09/08/22 #180  Notes to clinic: non delegated Rx  Requested Prescriptions  Pending Prescriptions Disp Refills   phenytoin (PHENYTOIN INFATABS) 50 MG tablet 45 tablet 3     Not Delegated - Neurology:  Anticonvulsants - phenytoin Failed - 11/22/2022  1:03 PM      Failed - This refill cannot be delegated      Failed - Phenytoin (serum) in normal range and within 360 days    Phenytoin, Total  Date Value Ref Range Status  03/26/2022 23.9 (H) 10.0 - 20.0 ug/mL Final    Comment:    (NOTE)                                Detection Limit =  0.8                          <0.8 Indicates None Detected Patient drug level exceeds published reference range.  Evaluate clinically for signs of potential toxicity.    Phenytoin, Serum  Date Value Ref Range Status  04/30/2022 21.2 (HH) 10.0 - 20.0 ug/mL Final    Comment:                                    Detection Limit =  0.8                           <0.8 Indicates None Detected Patient drug level exceeds published reference range.  Evaluate clinically for signs of potential toxicity.    Phenytoin Lvl  Date Value Ref Range Status  09/08/2022 7.9 (L) 10.0 - 20.0 ug/mL Final    Comment:    Performed at [facility], [place] [address] [place], [place] [address]   Phenytoin, Free  Date Value Ref Range Status  04/30/2022 1.5 1.0 - 2.0 ug/mL Final    Comment:                                    Detection Limit = 0.5         Passed - ALT in normal range and within 360 days    ALT  Date Value Ref Range Status  07/02/2022 16 0 - 44 U/L Final         Passed - AST in normal range and within 360 days    AST  Date Value Ref Range Status  07/02/2022 21 15 - 41 U/L Final         Passed - HGB in normal range and within 360 days    Hemoglobin  Date Value Ref Range Status   09/08/2022 13.8 13.0 - 17.0 g/dL Final  10/26/2021 14.2 13.0 - 17.7 g/dL Final         Passed - HCT in normal range and within 360 days    HCT  Date Value Ref Range Status  09/08/2022 40.3 39.0 - 52.0 % Final   Hematocrit  Date Value Ref Range Status  10/26/2021 41.7 37.5 - 51.0 % Final         Passed -  PLT in normal range and within 360 days    Platelets  Date Value Ref Range Status  09/08/2022 156 150 - 400 K/uL Final  10/26/2021 223 150 - 450 x10E3/uL Final         Passed - WBC in normal range and within 360 days    WBC  Date Value Ref Range Status  09/08/2022 5.8 4.0 - 10.5 K/uL Final         Passed - Completed PHQ-2 or PHQ-9 in the last 360 days      Passed - Patient is not pregnant      Passed - Valid encounter within last 12 months    Recent Outpatient Visits           6 months ago Seizure [facility])   [place] Ladell Pier, MD   9 months ago Essential hypertension   [place] Ladell Pier, MD   1 year ago Seizure [facility])   [place], [place], [place]   1 year ago Need for immunization against influenza   [place], RPH-CPP   1 year ago Essential hypertension   [place], MD       Future Appointments             In 3 weeks Gildardo Pounds, NP [place]               Requested Prescriptions  Pending Prescriptions Disp Refills   phenytoin (PHENYTOIN INFATABS) 50 MG tablet 45 tablet 3     Not Delegated - Neurology:  Anticonvulsants - phenytoin Failed - 11/22/2022  1:03 PM      Failed - This refill cannot be delegated      Failed - Phenytoin (serum) in normal range and within 360 days    Phenytoin, Total  Date Value Ref Range Status  03/26/2022 23.9 (H) 10.0 - 20.0 ug/mL  Final    Comment:    (NOTE)                                Detection Limit =  0.8                          <0.8 Indicates None Detected Patient drug level exceeds published reference range.  Evaluate clinically for signs of potential toxicity.    Phenytoin, Serum  Date Value Ref Range Status  04/30/2022 21.2 (HH) 10.0 - 20.0 ug/mL Final    Comment:                                    Detection Limit =  0.8                           <0.8 Indicates None Detected Patient drug level exceeds published reference range.  Evaluate clinically for signs of potential toxicity.    Phenytoin Lvl  Date Value Ref Range Status  09/08/2022 7.9 (L) 10.0 - 20.0 ug/mL Final    Comment:    Performed at [facility], [place] [address] [place], [place]  [address]   Phenytoin, Free  Date Value Ref Range Status  04/30/2022 1.5 1.0 - 2.0 ug/mL Final    Comment:                                    Detection Limit = 0.5         Passed - ALT in normal range and within 360 days    ALT  Date Value Ref Range Status  07/02/2022 16 0 - 44 U/L Final         Passed - AST in normal range and within 360 days    AST  Date Value Ref Range Status  07/02/2022 21 15 - 41 U/L Final         Passed - HGB in normal range and within 360 days    Hemoglobin  Date Value Ref Range Status  09/08/2022 13.8 13.0 - 17.0 g/dL Final  10/26/2021 14.2 13.0 - 17.7 g/dL Final         Passed - HCT in normal range and within 360 days    HCT  Date Value Ref Range Status  09/08/2022 40.3 39.0 - 52.0 % Final   Hematocrit  Date Value Ref Range Status  10/26/2021 41.7 37.5 - 51.0 % Final         Passed - PLT in normal range and within 360 days    Platelets  Date Value Ref Range Status  09/08/2022 156 150 - 400 K/uL Final  10/26/2021 223 150 - 450 x10E3/uL Final         Passed - WBC in normal range and within 360 days    WBC  Date Value Ref Range Status  09/08/2022 5.8 4.0 - 10.5 K/uL Final         Passed -  Completed PHQ-2 or PHQ-9 in the last 360 days      Passed - Patient is not pregnant      Passed - Valid encounter within last 12 months    Recent Outpatient Visits           6 months ago Seizure [facility])   [place] Ladell Pier, MD   9 months ago Essential hypertension   [place] Ladell Pier, MD   1 year ago Seizure [facility])   [place], [place], [place]   1 year ago Need for immunization against influenza   [place], RPH-CPP   1 year ago Essential hypertension   [place], MD       Future Appointments             In 3 weeks Gildardo Pounds, NP [place]

## 2022-11-22 NOTE — Telephone Encounter (Signed)
Medication Refill - Medication: PHENYTOIN INFATABS 50 MG tablet   Has the patient contacted their pharmacy? Yes.   (Agent: If no, request that the patient contact the pharmacy for the refill. If patient does not wish to contact the pharmacy document the reason why and proceed with request.) (Agent: If yes, when and what did the pharmacy advise?)  Preferred Pharmacy (with phone number or street name):  [id number] Phone: [phone]: [phone]  Has the patient been seen for an appointment in the last year OR does the patient have an upcoming appointment? Yes.    Agent: Please be advised that RX refills may take up to 3 business days. We ask that you follow-up with your pharmacy.

## 2022-11-26 ENCOUNTER — Encounter: Payer: Self-pay | Admitting: Neurology

## 2022-11-26 ENCOUNTER — Ambulatory Visit: Payer: 59 | Admitting: Neurology

## 2022-12-01 ENCOUNTER — Other Ambulatory Visit: Payer: Self-pay | Admitting: Internal Medicine

## 2022-12-01 DIAGNOSIS — R569 Unspecified convulsions: Secondary | ICD-10-CM

## 2022-12-04 ENCOUNTER — Other Ambulatory Visit: Payer: Self-pay | Admitting: Neurology

## 2022-12-04 ENCOUNTER — Other Ambulatory Visit: Payer: Self-pay | Admitting: Internal Medicine

## 2022-12-04 DIAGNOSIS — E785 Hyperlipidemia, unspecified: Secondary | ICD-10-CM

## 2022-12-04 DIAGNOSIS — R569 Unspecified convulsions: Secondary | ICD-10-CM

## 2022-12-17 ENCOUNTER — Ambulatory Visit: Payer: Self-pay | Admitting: *Deleted

## 2022-12-17 NOTE — Telephone Encounter (Signed)
Reason for Disposition  [1] Seizure of unknown duration AND [2] history of prior seizure(s) AND [3] back to baseline with no new concerning symptoms  Answer Assessment - Initial Assessment Questions 1. ONSET: "When did the seizure occur?"     This morning- confusion 2. DURATION: "How long did the seizure last (or how long has it been happening)?" (e.g., seconds, minutes)  Note: Most seizures last less than 5 minutes.     Last for a while- not confused at this time 3. DESCRIPTION: "Describe what happened during the seizure." "Did the body become stiff?" "Was there any jerking?"  "Did they lose consciousness during the seizure?"     Confusion only 4. CIRCUMSTANCE: "What was the person doing when the seizure began?"      Urinating, going in  and out of room 5. MENTAL STATUS AFTER SEIZURE: "Does the person seem more groggy or sleepy?" "Does the person know who they are, who you are, and where they are now?"      Patient did nap- up and alert 6. PRIOR SEIZURES: "Has the person had a seizure (convulsion) before?" (e.g., epilepsy, other cause)  If Yes, ask: "When was the last time?" and "What happened last time?"      Yes-1 month ago 7. EPILEPSY: "Does the person have epilepsy?" Note: Check for medical ID bracelet.     yes 8. MEDICINES: "Does the person take anticonvulsant medications?" (e.g., Yes, No; missed doses, any recent changes)     Patient is on medication- no missed doses 9. INJURY: "Was the person hurt or injured during the seizure?" (e.g., hit their head, bit their tongue)     No injury 10. OTHER SYMPTOMS: "Are there any other symptoms?" (e.g., fever, headache)       no  Protocols used: [employer]

## 2022-12-17 NOTE — Telephone Encounter (Signed)
Patient nephew calling: Chief Complaint: seizure today Symptoms: confusion Frequency: once today, last seizure 1 month ago Pertinent Negatives: Patient denies confusion now- alert Disposition: '[]'$ ED /'[]'$ Urgent Care (no appt availability in office) / '[]'$ Appointment(In office/virtual)/ '[]'$  [place] [facility]/ '[]'$ Home Care/ '[]'$ Refused Recommended Disposition /'[]'$ [facility]/ '[x]'$  Follow-up with PCP Additional Notes: Patient has appointment in office tomorrow- advised per protocol- second seizure, prolonged seizure, prolonged altered mental state go to ED

## 2022-12-18 ENCOUNTER — Ambulatory Visit: Payer: 59 | Attending: Nurse Practitioner | Admitting: Nurse Practitioner

## 2022-12-18 ENCOUNTER — Encounter: Payer: Self-pay | Admitting: Nurse Practitioner

## 2022-12-18 VITALS — BP 116/70 | HR 81 | Ht 67.0 in | Wt 157.6 lb

## 2022-12-18 DIAGNOSIS — R569 Unspecified convulsions: Secondary | ICD-10-CM | POA: Diagnosis not present

## 2022-12-18 DIAGNOSIS — E785 Hyperlipidemia, unspecified: Secondary | ICD-10-CM | POA: Diagnosis not present

## 2022-12-18 DIAGNOSIS — R252 Cramp and spasm: Secondary | ICD-10-CM | POA: Diagnosis not present

## 2022-12-18 MED ORDER — SIMVASTATIN 20 MG PO TABS: 20.0000 mg | ORAL_TABLET | Freq: Every day | ORAL | 1 refills | Status: DC

## 2022-12-18 MED ORDER — CYCLOBENZAPRINE HCL 5 MG PO TABS: 5.0000 mg | ORAL_TABLET | Freq: Every day | ORAL | 1 refills | Status: DC

## 2022-12-18 MED ORDER — PHENYTOIN SODIUM EXTENDED 100 MG PO CAPS: 200.0000 mg | ORAL_CAPSULE | Freq: Every day | ORAL | 0 refills | Status: DC

## 2022-12-18 MED ORDER — PHENYTOIN 50 MG PO CHEW: 25.0000 mg | CHEWABLE_TABLET | Freq: Every day | ORAL | 0 refills | Status: DC

## 2022-12-18 NOTE — Patient Instructions (Signed)
[facility] Address: [address], [place], [place] [address] Phone: [phone] Open ? Closes 5?PM

## 2022-12-18 NOTE — Progress Notes (Signed)
Patient has been having seizures. 67- nephew

## 2022-12-18 NOTE — Progress Notes (Signed)
Assessment & Plan:  [name] was seen today for leg cramps.  Diagnoses and all orders for this visit:  Leg cramps -     cyclobenzaprine (FLEXERIL) 5 MG tablet; Take 1 tablet (5 mg total) by mouth at bedtime. FOR LEG CRAMPS  Dyslipidemia -     simvastatin (ZOCOR) 20 MG tablet; Take 1 tablet (20 mg total) by mouth at bedtime. FOR CHOLESTEROL  Seizure (HCC) -     phenytoin (DILANTIN) 50 MG tablet; Chew 0.5 tablets (25 mg total) by mouth at bedtime. FOR SEIZURES -     phenytoin (DILANTIN) 100 MG ER capsule; Take 2 capsules (200 mg total) by mouth at bedtime. FOR SEIZURES    Patient has been counseled on age-appropriate routine health concerns for screening and prevention. These are reviewed and up-to-date. Referrals have been placed accordingly. Immunizations are up-to-date or declined.    Subjective:   Chief Complaint  Patient presents with   leg cramps   HPI [name] 72 y.o. male presents to office today accompanied by his nephew [name] (in [facility] with Left BKA). His nephew provides all of his history today due to Joshua. [name]'s dementia.   PMH hx of hepatitis C treated, sz disorder, CVA with residual left-sided weakness uses a rolling walker, HTN, memory deficit (followed by neurology), vascular dementia with beh disturbance, tubular adenomas, PBC    Joint/Muscle Pain: Patient complains of myalgias for which has been present for several weeks. Pain is located in  both legs , is described as very painful by nephew and causes [name] to cry out.   Associated symptoms include: none.  The patient has tried nothing for pain relief.  Related to injury:   no. No history of smoking.  Lab Results  Component Value Date   [facility] 93 04/03/2021      [name] also states [name] had a seizure yesterday and last month. He has not followed up with Neurology in over a year. [name] states he was not aware Joshua. [name] was seeing Neurology and thought he was following up with Dr. Wynetta Emery for his  seizures. I instructed him that [name] should follow up with Neurology at least once a year or sooner if instructed.  Will refill Dilantin and [name] states he will call [facility] and schedule an appt. Also he was instructed to be seen in the emergency room if he has another seizure prior to being seen by Neurology.   Review of Systems  Constitutional:  Negative for fever, malaise/fatigue and weight loss.  HENT: Negative.  Negative for nosebleeds.   Eyes: Negative.  Negative for blurred vision, double vision and photophobia.  Respiratory: Negative.  Negative for cough and shortness of breath.   Cardiovascular: Negative.  Negative for chest pain, palpitations and leg swelling.  Gastrointestinal: Negative.  Negative for heartburn, nausea and vomiting.  Musculoskeletal:  Positive for myalgias.  Neurological:  Positive for seizures. Negative for dizziness, focal weakness and headaches.  Psychiatric/Behavioral: Negative.  Negative for suicidal ideas.     Past Medical History:  Diagnosis Date   Alcoholism ([place])    History of, not active   Chronic hepatitis C ([place])    Dementia ([place])    Diverticulosis    Gait disorder    Gingival hypertrophy    Secondary to Dilantin   Hemiparesis and alteration of sensations as late effects of stroke ([place]) 01/25/2017   Left hemiparesis   Hiatal hernia    History of alcoholism ([place])  Hx of ischemic vertebrobasilar artery thalamic stroke    Right   Internal hemorrhoids    Memory disorder 03/04/2013   Primary biliary cirrhosis (HCC)    Seizures (HCC)    Stroke ([employer])    Right frontal, right thalamic   Tubular adenoma of colon    Ulnar neuropathy of left upper extremity     Past Surgical History:  Procedure Laterality Date   COLONOSCOPY WITH PROPOFOL N/A 07/10/2017   Procedure: COLONOSCOPY WITH PROPOFOL;  Surgeon: Otis Brace, MD;  Location: [place];  Service: Gastroenterology;  Laterality: N/A;   ESOPHAGOGASTRODUODENOSCOPY (EGD) WITH PROPOFOL  N/A 07/10/2017   Procedure: ESOPHAGOGASTRODUODENOSCOPY (EGD) WITH PROPOFOL;  Surgeon: Otis Brace, MD;  Location: [place];  Service: Gastroenterology;  Laterality: N/A;   HIP ARTHROPLASTY Left 11/26/2013   Procedure: LEFT HIP HEMIARTHROPLASTY;  Surgeon: Mcarthur Rossetti, MD;  Location: [place];  Service: Orthopedics;  Laterality: Left;   WRIST SURGERY Left 95 & 96    Family History  Problem Relation Age of Onset   Pulmonary embolism Mother    Liver disease Father    Diabetes Sister    Hypertension Sister    Hypertension Sister    Hypertension Sister    Seizures Neg Hx     Social History Reviewed with no changes to be made today.   Outpatient Medications Prior to Visit  Medication Sig Dispense Refill   levETIRAcetam (KEPPRA) 750 MG tablet TAKE 2 TABLETS (1,500 MG TOTAL) BY MOUTH 2 (TWO) TIMES DAILY. 360 tablet 3   memantine (NAMENDA) 10 MG tablet TAKE 1 TABLET BY MOUTH TWICE A DAY 28 tablet 0   ursodiol (ACTIGALL) 250 MG tablet TAKE 2 TABLETS BY MOUTH IN THE MORNING AND AT BEDTIME. NEEDS ENDO/COLON FOR ADDITIONAL REFILLS 120 tablet 3   ursodiol (ACTIGALL) 500 MG tablet Take 1 tablet (500 mg total) by mouth in the morning and at bedtime. 180 tablet 3   phenytoin (DILANTIN) 100 MG ER capsule TAKE 2 CAPSULES BY MOUTH AT BEDTIME 28 capsule 0   phenytoin (PHENYTOIN INFATABS) 50 MG tablet CHEW 0.5 TABLETS (25 MG TOTAL) BY MOUTH AT BEDTIME. 45 tablet 3   simvastatin (ZOCOR) 20 MG tablet TAKE 1 TABLET BY MOUTH DAILY AT 6 PM. 14 tablet 0   aspirin EC 81 MG tablet Take 1 tablet (81 mg total) by mouth daily. (Patient not taking: Reported on 09/08/2022) 100 tablet 0   pantoprazole (PROTONIX) 40 MG tablet Take 1 tablet (40 mg total) by mouth daily. (Patient not taking: Reported on 09/08/2022) 90 tablet 3   No facility-administered medications prior to visit.    No Known Allergies     Objective:    BP 116/70   Pulse 81   Ht '5\' 7"'$  (1.702 m)   Wt 157 lb 9.6 oz (71.5 kg)   SpO2  96%   BMI 24.68 kg/m  Wt Readings from Last 3 Encounters:  12/18/22 157 lb 9.6 oz (71.5 kg)  07/02/22 145 lb 8.1 oz (66 kg)  05/25/22 145 lb 8.1 oz (66 kg)    Physical Exam Vitals and nursing note reviewed.  Constitutional:      Appearance: He is well-developed.  HENT:     Head: Normocephalic and atraumatic.  Cardiovascular:     Rate and Rhythm: Normal rate and regular rhythm.     Heart sounds: Normal heart sounds. No murmur heard.    No friction rub. No gallop.  Pulmonary:     Effort: Pulmonary effort is normal. No tachypnea  or respiratory distress.     Breath sounds: Normal breath sounds. No decreased breath sounds, wheezing, rhonchi or rales.  Chest:     Chest wall: No tenderness.  Abdominal:     General: Bowel sounds are normal.     Palpations: Abdomen is soft.  Musculoskeletal:        General: Normal range of motion.     Cervical back: Normal range of motion.  Skin:    General: Skin is warm and dry.  Neurological:     Mental Status: He is alert and oriented to person, place, and time.     Coordination: Coordination normal.  Psychiatric:        Behavior: Behavior normal. Behavior is cooperative.        Thought Content: Thought content normal.        Judgment: Judgment normal.          Patient has been counseled extensively about nutrition and exercise as well as the importance of adherence with medications and regular follow-up. The patient was given clear instructions to go to ER or return to medical center if symptoms don't improve, worsen or new problems develop. The patient verbalized understanding.   Follow-up: Return if symptoms worsen or fail to improve.   Gildardo Pounds, FNP-BC [facility] and [facility] [place], [place]   12/18/2022, 10:08 PM

## 2023-01-21 ENCOUNTER — Telehealth: Payer: Self-pay | Admitting: Internal Medicine

## 2023-01-21 NOTE — Telephone Encounter (Signed)
Contacted Bertis Ruddy to schedule their annual wellness visit. Appointment made for 01/24/23.  Barkley Boards AWV direct phone # [phone]

## 2023-01-24 ENCOUNTER — Telehealth: Payer: Self-pay

## 2023-01-24 ENCOUNTER — Ambulatory Visit: Payer: 59 | Attending: Internal Medicine

## 2023-01-24 NOTE — Telephone Encounter (Signed)
This nurse attempted to call patient three times for AWV. Mailbox was full. Unable to leave a message.

## 2023-02-02 ENCOUNTER — Other Ambulatory Visit: Payer: Self-pay | Admitting: Neurology

## 2023-02-02 ENCOUNTER — Other Ambulatory Visit: Payer: Self-pay | Admitting: Nurse Practitioner

## 2023-02-02 DIAGNOSIS — R252 Cramp and spasm: Secondary | ICD-10-CM

## 2023-02-02 DIAGNOSIS — R569 Unspecified convulsions: Secondary | ICD-10-CM

## 2023-02-11 ENCOUNTER — Other Ambulatory Visit: Payer: Self-pay | Admitting: Internal Medicine

## 2023-02-11 DIAGNOSIS — R569 Unspecified convulsions: Secondary | ICD-10-CM

## 2023-02-11 NOTE — Telephone Encounter (Unsigned)
Copied from CRM [id number]. Topic: General - Other >> Feb 11, 2023  3:31 PM Everette C wrote: Reason for CRM: Medication Refill - Medication: memantine (NAMENDA) 10 MG tablet [[id number]]   phenytoin (DILANTIN) 50 MG tablet [[id number]]  Has the patient contacted their pharmacy? Yes.   (Agent: If no, request that the patient contact the pharmacy for the refill. If patient does not wish to contact the pharmacy document the reason why and proceed with request.) (Agent: If yes, when and what did the pharmacy advise?)  Preferred Pharmacy (with phone number or street name): [id number], [place] - [address] [place] [address] Phone: [phone] Fax: [phone] Hours: Not open 24 hours   Has the patient been seen for an appointment in the last year OR does the patient have an upcoming appointment? Yes.    Agent: Please be advised that RX refills may take up to 3 business days. We ask that you follow-up with your pharmacy.

## 2023-02-12 MED ORDER — PHENYTOIN 50 MG PO CHEW: 25.0000 mg | CHEWABLE_TABLET | Freq: Every day | ORAL | 1 refills | Status: DC

## 2023-02-12 MED ORDER — MEMANTINE HCL 10 MG PO TABS: 10.0000 mg | ORAL_TABLET | Freq: Two times a day (BID) | ORAL | 1 refills | Status: DC

## 2023-02-12 NOTE — Telephone Encounter (Signed)
Requested medications are due for refill today.  Provider to decide  Requested medications are on the active medications list.  yes  Last refill. Memantine 12/05/2022 #28 0 rf. Phenytoin  12/18/2022 #90 0 rf  Future visit scheduled.   no  Notes to clinic.  Phenytoin is not delegated. Memantine was given short supply.    Requested Prescriptions  Pending Prescriptions Disp Refills   memantine (NAMENDA) 10 MG tablet 28 tablet 0    Sig: Take 1 tablet (10 mg total) by mouth 2 (two) times daily.     Neurology:  Alzheimer's Agents 2 Passed - 02/11/2023  4:11 PM      Passed - Cr in normal range and within 360 days    Creat  Date Value Ref Range Status  04/08/2017 0.93 0.70 - 1.25 mg/dL Final    Comment:      For patients > or = 70 years of age: The upper reference limit for Creatinine is approximately 13% higher for people identified as African-American.      Creatinine, Ser  Date Value Ref Range Status  09/08/2022 0.96 0.61 - 1.24 mg/dL Final         Passed - eGFR is 5 or above and within 360 days    GFR, Est African American  Date Value Ref Range Status  04/08/2017 >89 >=60 mL/min Final   GFR calc Af Amer  Date Value Ref Range Status  07/25/2020 >60 >60 mL/min Final   GFR, Est Non African American  Date Value Ref Range Status  04/08/2017 86 >=60 mL/min Final   GFR, Estimated  Date Value Ref Range Status  09/08/2022 >60 >60 mL/min Final    Comment:    (NOTE) Calculated using the CKD-EPI Creatinine Equation (2021)    GFR  Date Value Ref Range Status  06/15/2020 94.35 >60.00 mL/min Final   eGFR  Date Value Ref Range Status  11/21/2021 94 >59 mL/min/1.73 Final         Passed - Valid encounter within last 6 months    Recent Outpatient Visits           1 month ago Leg cramps   [place] [facility] Claiborne Rigg, NP   9 months ago Seizure [facility])   [place] [facility] Marcine Matar, MD   11 months  ago Essential hypertension   [place] [facility] Marcine Matar, MD   1 year ago Seizure [facility])   [place] [facility] [place], [place], [place]   1 year ago Need for immunization against influenza   [facility] [place], Jeannett Senior L, RPH-CPP               phenytoin (DILANTIN) 50 MG tablet 90 tablet 0    Sig: Chew 0.5 tablets (25 mg total) by mouth at bedtime. FOR SEIZURES     Not Delegated - Neurology:  Anticonvulsants - phenytoin Failed - 02/11/2023  4:11 PM      Failed - This refill cannot be delegated      Failed - Phenytoin (serum) in normal range and within 360 days    Phenytoin, Total  Date Value Ref Range Status  03/26/2022 23.9 (H) 10.0 - 20.0 ug/mL Final    Comment:    (NOTE)  Detection Limit =  0.8                          <0.8 Indicates None Detected Patient drug level exceeds published reference range.  Evaluate clinically for signs of potential toxicity.    Phenytoin, Serum  Date Value Ref Range Status  04/30/2022 21.2 (HH) 10.0 - 20.0 ug/mL Final    Comment:                                    Detection Limit =  0.8                           <0.8 Indicates None Detected Patient drug level exceeds published reference range.  Evaluate clinically for signs of potential toxicity.    Phenytoin Lvl  Date Value Ref Range Status  09/08/2022 7.9 (L) 10.0 - 20.0 ug/mL Final    Comment:    Performed at [facility], [address] [place], [place] [address]   Phenytoin, Free  Date Value Ref Range Status  04/30/2022 1.5 1.0 - 2.0 ug/mL Final    Comment:                                    Detection Limit = 0.5         Passed - ALT in normal range and within 360 days    ALT  Date Value Ref Range Status  07/02/2022 16 0 - 44 U/L Final         Passed - AST in normal range and within 360 days    AST  Date Value Ref Range Status   07/02/2022 21 15 - 41 U/L Final         Passed - HGB in normal range and within 360 days    Hemoglobin  Date Value Ref Range Status  09/08/2022 13.8 13.0 - 17.0 g/dL Final  60/45/4098 11.9 13.0 - 17.7 g/dL Final         Passed - HCT in normal range and within 360 days    HCT  Date Value Ref Range Status  09/08/2022 40.3 39.0 - 52.0 % Final   Hematocrit  Date Value Ref Range Status  10/26/2021 41.7 37.5 - 51.0 % Final         Passed - PLT in normal range and within 360 days    Platelets  Date Value Ref Range Status  09/08/2022 156 150 - 400 K/uL Final  10/26/2021 223 150 - 450 x10E3/uL Final         Passed - WBC in normal range and within 360 days    WBC  Date Value Ref Range Status  09/08/2022 5.8 4.0 - 10.5 K/uL Final         Passed - Completed PHQ-2 or PHQ-9 in the last 360 days      Passed - Patient is not pregnant      Passed - Valid encounter within last 12 months    Recent Outpatient Visits           1 month ago Leg cramps   [place] [facility] [place], Shea Stakes, NP   9 months ago Seizure [facility])   [facility] Marcine Matar, MD   11 months ago Essential hypertension   [place] [facility] Marcine Matar, MD   1 year ago Seizure [facility])   [facility] [place], [place], [place]   1 year ago Need for immunization against influenza   [facility], Cornelius Moras, RPH-CPP

## 2023-02-19 ENCOUNTER — Telehealth: Payer: Self-pay | Admitting: Internal Medicine

## 2023-02-19 DIAGNOSIS — R569 Unspecified convulsions: Secondary | ICD-10-CM

## 2023-02-19 MED ORDER — PHENYTOIN SODIUM EXTENDED 100 MG PO CAPS: 200.0000 mg | ORAL_CAPSULE | Freq: Every day | ORAL | 1 refills | Status: DC

## 2023-02-19 NOTE — Telephone Encounter (Signed)
The nephew called in stating the last time he spoke with the patients provider about his anti seizure medication she told him to give it to the patient whole but when he got it from the pharmacy they still have it broken up in halves. He said with that being the case he is already out of his medication. The 50s he would get 90 but the 100s he would get 180 pills. He truly needs more meds as he has to pay out of pocket now because he is out unless more is called in. He is not able to get his meds until May 20th. What can he can do now? Please assist as soon as possible as [facility]   [id number], [place] - [address] Phone: [phone]  Fax: [phone]

## 2023-02-19 NOTE — Telephone Encounter (Signed)
Prescription had been increased to to Dilantin ER 200 mg nightly but somehow the [facility] staff refilled the old dose.  I have sent in corrected dose to his pharmacy.

## 2023-02-19 NOTE — Addendum Note (Signed)
Addended by: Hoy Register on: 02/19/2023 04:51 PM   Modules accepted: Orders

## 2023-02-23 ENCOUNTER — Emergency Department (HOSPITAL_COMMUNITY): Payer: 59

## 2023-02-23 ENCOUNTER — Other Ambulatory Visit: Payer: Self-pay

## 2023-02-23 ENCOUNTER — Inpatient Hospital Stay (HOSPITAL_COMMUNITY)
Admission: EM | Admit: 2023-02-23 | Discharge: 2023-02-25 | DRG: 101 | Disposition: A | Discharge status: Home / Self Care | Payer: 59 | Attending: Internal Medicine | Admitting: Internal Medicine

## 2023-02-23 ENCOUNTER — Encounter (HOSPITAL_COMMUNITY): Payer: Self-pay

## 2023-02-23 DIAGNOSIS — Z1152 Encounter for screening for COVID-19: Secondary | ICD-10-CM | POA: Diagnosis not present

## 2023-02-23 DIAGNOSIS — B182 Chronic viral hepatitis C: Secondary | ICD-10-CM | POA: Diagnosis present

## 2023-02-23 DIAGNOSIS — R569 Unspecified convulsions: Secondary | ICD-10-CM | POA: Diagnosis not present

## 2023-02-23 DIAGNOSIS — R918 Other nonspecific abnormal finding of lung field: Secondary | ICD-10-CM | POA: Diagnosis not present

## 2023-02-23 DIAGNOSIS — Z7982 Long term (current) use of aspirin: Secondary | ICD-10-CM | POA: Diagnosis not present

## 2023-02-23 DIAGNOSIS — I10 Essential (primary) hypertension: Secondary | ICD-10-CM | POA: Diagnosis not present

## 2023-02-23 DIAGNOSIS — G40909 Epilepsy, unspecified, not intractable, without status epilepticus: Secondary | ICD-10-CM | POA: Diagnosis not present

## 2023-02-23 DIAGNOSIS — A419 Sepsis, unspecified organism: Secondary | ICD-10-CM | POA: Diagnosis not present

## 2023-02-23 DIAGNOSIS — R413 Other amnesia: Secondary | ICD-10-CM | POA: Diagnosis present

## 2023-02-23 DIAGNOSIS — Z96642 Presence of left artificial hip joint: Secondary | ICD-10-CM | POA: Diagnosis present

## 2023-02-23 DIAGNOSIS — I69398 Other sequelae of cerebral infarction: Secondary | ICD-10-CM | POA: Diagnosis not present

## 2023-02-23 DIAGNOSIS — K7400 Hepatic fibrosis, unspecified: Secondary | ICD-10-CM | POA: Diagnosis present

## 2023-02-23 DIAGNOSIS — F0280 Dementia in other diseases classified elsewhere without behavioral disturbance: Secondary | ICD-10-CM | POA: Diagnosis present

## 2023-02-23 DIAGNOSIS — K2270 Barrett's esophagus without dysplasia: Secondary | ICD-10-CM | POA: Diagnosis present

## 2023-02-23 DIAGNOSIS — Z87891 Personal history of nicotine dependence: Secondary | ICD-10-CM | POA: Diagnosis not present

## 2023-02-23 DIAGNOSIS — R6520 Severe sepsis without septic shock: Secondary | ICD-10-CM | POA: Diagnosis not present

## 2023-02-23 DIAGNOSIS — I69359 Hemiplegia and hemiparesis following cerebral infarction affecting unspecified side: Secondary | ICD-10-CM

## 2023-02-23 DIAGNOSIS — R0682 Tachypnea, not elsewhere classified: Secondary | ICD-10-CM | POA: Diagnosis present

## 2023-02-23 DIAGNOSIS — R000 Tachycardia, unspecified: Secondary | ICD-10-CM | POA: Diagnosis not present

## 2023-02-23 DIAGNOSIS — K22719 Barrett's esophagus with dysplasia, unspecified: Secondary | ICD-10-CM | POA: Diagnosis not present

## 2023-02-23 DIAGNOSIS — Z833 Family history of diabetes mellitus: Secondary | ICD-10-CM

## 2023-02-23 DIAGNOSIS — T420X6A Underdosing of hydantoin derivatives, initial encounter: Secondary | ICD-10-CM | POA: Diagnosis present

## 2023-02-23 DIAGNOSIS — K743 Primary biliary cirrhosis: Secondary | ICD-10-CM | POA: Diagnosis present

## 2023-02-23 DIAGNOSIS — J189 Pneumonia, unspecified organism: Secondary | ICD-10-CM

## 2023-02-23 DIAGNOSIS — Z8249 Family history of ischemic heart disease and other diseases of the circulatory system: Secondary | ICD-10-CM | POA: Diagnosis not present

## 2023-02-23 DIAGNOSIS — Z91138 Patient's unintentional underdosing of medication regimen for other reason: Secondary | ICD-10-CM

## 2023-02-23 DIAGNOSIS — Z79899 Other long term (current) drug therapy: Secondary | ICD-10-CM

## 2023-02-23 DIAGNOSIS — G9389 Other specified disorders of brain: Secondary | ICD-10-CM | POA: Diagnosis not present

## 2023-02-23 DIAGNOSIS — G309 Alzheimer's disease, unspecified: Secondary | ICD-10-CM | POA: Diagnosis present

## 2023-02-23 DIAGNOSIS — Z86010 Personal history of colonic polyps: Secondary | ICD-10-CM

## 2023-02-23 DIAGNOSIS — I69354 Hemiplegia and hemiparesis following cerebral infarction affecting left non-dominant side: Secondary | ICD-10-CM

## 2023-02-23 LAB — CBC WITH DIFFERENTIAL/PLATELET
Abs Immature Granulocytes: 0.01 10*3/uL (ref 0.00–0.07)
Basophils Absolute: 0 10*3/uL (ref 0.0–0.1)
Basophils Relative: 0 %
Eosinophils Absolute: 0 10*3/uL (ref 0.0–0.5)
Eosinophils Relative: 1 %
HCT: 39.8 % (ref 39.0–52.0)
Hemoglobin: 13.7 g/dL (ref 13.0–17.0)
Immature Granulocytes: 0 %
Lymphocytes Relative: 12 %
Lymphs Abs: 0.7 10*3/uL (ref 0.7–4.0)
MCH: 32.5 pg (ref 26.0–34.0)
MCHC: 34.4 g/dL (ref 30.0–36.0)
MCV: 94.5 fL (ref 80.0–100.0)
Monocytes Absolute: 0.8 10*3/uL (ref 0.1–1.0)
Monocytes Relative: 15 %
Neutro Abs: 4 10*3/uL (ref 1.7–7.7)
Neutrophils Relative %: 72 %
Platelets: 152 10*3/uL (ref 150–400)
RBC: 4.21 MIL/uL — ABNORMAL LOW (ref 4.22–5.81)
RDW: 12.2 % (ref 11.5–15.5)
WBC: 5.6 10*3/uL (ref 4.0–10.5)
nRBC: 0 % (ref 0.0–0.2)

## 2023-02-23 LAB — CULTURE, BLOOD (ROUTINE X 2)

## 2023-02-23 LAB — COMPREHENSIVE METABOLIC PANEL
ALT: 18 U/L (ref 0–44)
AST: 17 U/L (ref 15–41)
Albumin: 3.7 g/dL (ref 3.5–5.0)
Alkaline Phosphatase: 130 U/L — ABNORMAL HIGH (ref 38–126)
Anion gap: 8 (ref 5–15)
BUN: 18 mg/dL (ref 8–23)
CO2: 24 mmol/L (ref 22–32)
Calcium: 7.9 mg/dL — ABNORMAL LOW (ref 8.9–10.3)
Chloride: 105 mmol/L (ref 98–111)
Creatinine, Ser: 1.11 mg/dL (ref 0.61–1.24)
GFR, Estimated: >60 mL/min (ref >60)
Glucose, Bld: 137 mg/dL — ABNORMAL HIGH (ref 70–99)
Potassium: 3.5 mmol/L (ref 3.5–5.1)
Sodium: 137 mmol/L (ref 135–145)
Total Bilirubin: 0.6 mg/dL (ref 0.3–1.2)
Total Protein: 7.3 g/dL (ref 6.5–8.1)

## 2023-02-23 LAB — URINALYSIS, W/ REFLEX TO CULTURE (INFECTION SUSPECTED)
Bilirubin Urine: NEGATIVE
Glucose, UA: NEGATIVE mg/dL
Hgb urine dipstick: NEGATIVE
Ketones, ur: NEGATIVE mg/dL
Leukocytes,Ua: NEGATIVE
Nitrite: NEGATIVE
Protein, ur: NEGATIVE mg/dL
Specific Gravity, Urine: 1.028 (ref 1.005–1.030)
pH: 5 (ref 5.0–8.0)

## 2023-02-23 LAB — RESPIRATORY PANEL BY PCR

## 2023-02-23 LAB — RESP PANEL BY RT-PCR (RSV, FLU A&B, COVID)  RVPGX2
Influenza A by PCR: NEGATIVE
Influenza B by PCR: NEGATIVE
Resp Syncytial Virus by PCR: NEGATIVE
SARS Coronavirus 2 by RT PCR: NEGATIVE

## 2023-02-23 LAB — LACTIC ACID, PLASMA: Lactic Acid, Venous: 1.6 mmol/L (ref 0.5–1.9)

## 2023-02-23 LAB — CK: Total CK: 48 U/L — ABNORMAL LOW (ref 49–397)

## 2023-02-23 LAB — PROTIME-INR
INR: 1.1 (ref 0.8–1.2)
Prothrombin Time: 14.5 seconds (ref 11.4–15.2)

## 2023-02-23 LAB — CBG MONITORING, ED: Glucose-Capillary: 140 mg/dL — ABNORMAL HIGH (ref 70–99)

## 2023-02-23 LAB — MAGNESIUM: Magnesium: 1.5 mg/dL — ABNORMAL LOW (ref 1.7–2.4)

## 2023-02-23 LAB — HIV ANTIBODY (ROUTINE TESTING W REFLEX): HIV Screen 4th Generation wRfx: NONREACTIVE

## 2023-02-23 LAB — PHENYTOIN LEVEL, TOTAL: Phenytoin Lvl: 18.9 ug/mL (ref 10.0–20.0)

## 2023-02-23 LAB — APTT: aPTT: 26 seconds (ref 24–36)

## 2023-02-23 LAB — PROCALCITONIN: Procalcitonin: <0.1 ng/mL

## 2023-02-23 MED ORDER — ACETAMINOPHEN 500 MG PO TABS
1000.0000 mg | ORAL_TABLET | Freq: Once | ORAL | Status: AC
  Administered 2023-02-23: 1000 mg via ORAL
  Filled 2023-02-23: qty 2

## 2023-02-23 MED ORDER — ASPIRIN 81 MG PO TBEC
81.0000 mg | DELAYED_RELEASE_TABLET | Freq: Every day | ORAL | Status: DC
  Administered 2023-02-23 – 2023-02-25 (×3): 81 mg via ORAL
  Filled 2023-02-23 (×3): qty 1

## 2023-02-23 MED ORDER — VANCOMYCIN HCL IN DEXTROSE 1-5 GM/200ML-% IV SOLN: 1000.0000 mg | Freq: Once | INTRAVENOUS | Status: DC

## 2023-02-23 MED ORDER — ONDANSETRON HCL 4 MG PO TABS: 4.0000 mg | ORAL_TABLET | Freq: Four times a day (QID) | ORAL | Status: DC | PRN

## 2023-02-23 MED ORDER — PANTOPRAZOLE SODIUM 40 MG PO TBEC
40.0000 mg | DELAYED_RELEASE_TABLET | Freq: Every day | ORAL | Status: DC
  Administered 2023-02-23 – 2023-02-25 (×3): 40 mg via ORAL
  Filled 2023-02-23 (×3): qty 1

## 2023-02-23 MED ORDER — DEXTROSE 5 % IV SOLN
10.0000 mg/kg | Freq: Once | INTRAVENOUS | Status: AC
  Administered 2023-02-23: 700 mg via INTRAVENOUS
  Filled 2023-02-23: qty 14

## 2023-02-23 MED ORDER — SODIUM CHLORIDE 0.9 % IV SOLN
2.0000 g | Freq: Once | INTRAVENOUS | Status: AC
  Administered 2023-02-23: 2 g via INTRAVENOUS
  Filled 2023-02-23: qty 20

## 2023-02-23 MED ORDER — SODIUM CHLORIDE 0.9 % IV SOLN: 2.0000 g | INTRAVENOUS | Status: DC

## 2023-02-23 MED ORDER — MAGNESIUM SULFATE 2 GM/50ML IV SOLN
2.0000 g | Freq: Once | INTRAVENOUS | Status: AC
  Administered 2023-02-23: 2 g via INTRAVENOUS
  Filled 2023-02-23: qty 50

## 2023-02-23 MED ORDER — MEMANTINE HCL 10 MG PO TABS
10.0000 mg | ORAL_TABLET | Freq: Two times a day (BID) | ORAL | Status: DC
  Administered 2023-02-23 – 2023-02-25 (×4): 10 mg via ORAL
  Filled 2023-02-23 (×4): qty 1

## 2023-02-23 MED ORDER — LACTATED RINGERS IV SOLN: INTRAVENOUS | Status: AC

## 2023-02-23 MED ORDER — ENOXAPARIN SODIUM 40 MG/0.4ML IJ SOSY
40.0000 mg | PREFILLED_SYRINGE | INTRAMUSCULAR | Status: DC
  Administered 2023-02-23 – 2023-02-24 (×2): 40 mg via SUBCUTANEOUS
  Filled 2023-02-23 (×2): qty 0.4

## 2023-02-23 MED ORDER — ACETAMINOPHEN 325 MG PO TABS: 650.0000 mg | ORAL_TABLET | Freq: Four times a day (QID) | ORAL | Status: DC | PRN

## 2023-02-23 MED ORDER — SODIUM CHLORIDE 0.9 % IV SOLN: 500.0000 mg | INTRAVENOUS | Status: DC

## 2023-02-23 MED ORDER — FOLIC ACID 1 MG PO TABS
1.0000 mg | ORAL_TABLET | Freq: Every day | ORAL | Status: DC
  Administered 2023-02-23 – 2023-02-25 (×3): 1 mg via ORAL
  Filled 2023-02-23 (×3): qty 1

## 2023-02-23 MED ORDER — SIMVASTATIN 20 MG PO TABS
20.0000 mg | ORAL_TABLET | Freq: Every day | ORAL | Status: DC
  Administered 2023-02-23 – 2023-02-24 (×2): 20 mg via ORAL
  Filled 2023-02-23 (×2): qty 1

## 2023-02-23 MED ORDER — THIAMINE MONONITRATE 100 MG PO TABS
100.0000 mg | ORAL_TABLET | Freq: Every day | ORAL | Status: DC
  Administered 2023-02-24 – 2023-02-25 (×2): 100 mg via ORAL
  Filled 2023-02-23 (×3): qty 1

## 2023-02-23 MED ORDER — THIAMINE HCL 100 MG/ML IJ SOLN
100.0000 mg | Freq: Every day | INTRAMUSCULAR | Status: DC
  Administered 2023-02-23: 100 mg via INTRAVENOUS
  Filled 2023-02-23: qty 2

## 2023-02-23 MED ORDER — LACTATED RINGERS IV BOLUS (SEPSIS): 1000.0000 mL | Freq: Once | INTRAVENOUS | Status: DC

## 2023-02-23 MED ORDER — SODIUM CHLORIDE 0.9 % IV SOLN: INTRAVENOUS | Status: AC

## 2023-02-23 MED ORDER — LEVETIRACETAM 500 MG PO TABS
1500.0000 mg | ORAL_TABLET | Freq: Two times a day (BID) | ORAL | Status: DC
  Administered 2023-02-24 – 2023-02-25 (×3): 1500 mg via ORAL
  Filled 2023-02-23 (×3): qty 3

## 2023-02-23 MED ORDER — SODIUM CHLORIDE 0.9 % IV SOLN
2.0000 g | Freq: Once | INTRAVENOUS | Status: AC
  Administered 2023-02-23: 2 g via INTRAVENOUS
  Filled 2023-02-23: qty 2000

## 2023-02-23 MED ORDER — PHENYTOIN SODIUM EXTENDED 100 MG PO CAPS
200.0000 mg | ORAL_CAPSULE | Freq: Every day | ORAL | Status: DC
  Administered 2023-02-23 – 2023-02-24 (×2): 200 mg via ORAL
  Filled 2023-02-23 (×2): qty 2

## 2023-02-23 MED ORDER — ADULT MULTIVITAMIN W/MINERALS CH
1.0000 | ORAL_TABLET | Freq: Every day | ORAL | Status: DC
  Administered 2023-02-23 – 2023-02-25 (×3): 1 via ORAL
  Filled 2023-02-23 (×3): qty 1

## 2023-02-23 MED ORDER — SODIUM CHLORIDE 0.9 % IV BOLUS
1000.0000 mL | Freq: Once | INTRAVENOUS | Status: AC
  Administered 2023-02-23: 1000 mL via INTRAVENOUS

## 2023-02-23 MED ORDER — LORAZEPAM 2 MG/ML IJ SOLN: 4.0000 mg | INTRAMUSCULAR | Status: DC | PRN

## 2023-02-23 MED ORDER — LEVETIRACETAM IN NACL 1500 MG/100ML IV SOLN
1500.0000 mg | Freq: Once | INTRAVENOUS | Status: AC
  Administered 2023-02-23: 1500 mg via INTRAVENOUS
  Filled 2023-02-23: qty 100

## 2023-02-23 MED ORDER — SODIUM CHLORIDE 0.9 % IV SOLN
500.0000 mg | INTRAVENOUS | Status: DC
  Administered 2023-02-23: 500 mg via INTRAVENOUS
  Filled 2023-02-23: qty 5

## 2023-02-23 MED ORDER — DEXAMETHASONE SODIUM PHOSPHATE 10 MG/ML IJ SOLN
10.0000 mg | Freq: Once | INTRAMUSCULAR | Status: AC
  Administered 2023-02-23: 10 mg via INTRAVENOUS
  Filled 2023-02-23: qty 1

## 2023-02-23 MED ORDER — CYCLOBENZAPRINE HCL 5 MG PO TABS: 5.0000 mg | ORAL_TABLET | Freq: Every day | ORAL | Status: DC

## 2023-02-23 MED ORDER — ACETAMINOPHEN 650 MG RE SUPP: 650.0000 mg | Freq: Four times a day (QID) | RECTAL | Status: DC | PRN

## 2023-02-23 MED ORDER — LORAZEPAM 1 MG PO TABS: 1.0000 mg | ORAL_TABLET | ORAL | Status: DC | PRN

## 2023-02-23 MED ORDER — ONDANSETRON HCL 4 MG/2ML IJ SOLN: 4.0000 mg | Freq: Four times a day (QID) | INTRAMUSCULAR | Status: DC | PRN

## 2023-02-23 MED ORDER — VANCOMYCIN HCL 1500 MG/300ML IV SOLN
1500.0000 mg | INTRAVENOUS | Status: AC
  Administered 2023-02-23: 1500 mg via INTRAVENOUS
  Filled 2023-02-23: qty 300

## 2023-02-23 MED ORDER — VANCOMYCIN HCL 750 MG/150ML IV SOLN: 750.0000 mg | Freq: Two times a day (BID) | INTRAVENOUS | Status: DC

## 2023-02-23 NOTE — Assessment & Plan Note (Addendum)
72 year old male presenting with breakthrough seizure found to have sepsis with fever to 101.7, tachycardia and tachypnea with possible left lower lobe infiltrate -admit to progressive -continue azithromycin and rocephin. Urine clean, no diarrhea/stomach pain and no open sores. CXR with possible infiltrate in LLL. He has been having a cough x 2 weeks -initially treated for meningitis as he was not back to baseline on initial exam by EDP, but he had just had a seizure on his way here. Patient is back to baseline and neurology discussed okay to defer LP and antibiotics for meningitis. He has no neck rigidity/pain/confusion. Likely was not back to baseline following seizure. -check urinary antigens -initial PCT wnl, 2nd pending.  -check RVP  -BC pending, lactic acid wnl  -IVF -repeat 2V CXR in AM -trend fever curve and CBC

## 2023-02-23 NOTE — Assessment & Plan Note (Addendum)
In setting of sepsis and not taking his dilantin even though level wnl  -continue keppra and dilantin -seizure precautions -frequent neuro checks  -no tongue biting/confusion/lethargy  -nephew denies any aspiration symptoms -aspiration precautions -check mag/CK  -ativan PRN for breakthrough seizure  -hold flexeril

## 2023-02-23 NOTE — ED Triage Notes (Signed)
Pt arrived via POV with family, witnessed seizure x2. Hx of seizures, has been out of seizure meds x2 weeks

## 2023-02-23 NOTE — H&P (Signed)
History and Physical    Patient: [name] ZOX:[id number] DOB: [birth year] DOA: 02/23/2023 DOS: the patient was seen and examined on 02/23/2023 PCP: Marcine Matar, MD  Patient coming from: Home - lives with his nephew and his nephews mother. He ambulates with cane.    Chief Complaint: seizures   HPI: [name] is a 72 y.o. male with medical history significant of hx of CVA with hemiparesis, seizure disorder, HTN, barrett's esophagus, liver fibrosis, hx of alcohol abuse, dementia who presented to ED with breakthrough seizures. He has not been taking his Dilantin and is now back to baseline. When he came to ED he had the fever, was not back to baseline so initiated medication for meningitis. His nephew gives history. He states he has not had his dilantin x 1 week. He thinks he may have had a seizure a few days ago, "but popped out of it" and then this AM he threw up. He then had a seizure that lasted for about 10 minutes. He went out of it on his own. His nephew states he took him to get something to eat.  He drank something and ate a bite and started to vomit again and then brought him to ED. On his way, he had another seizure.   His nephew denies any fevers, but states he has been coughing x 1-2 weeks, especially at night. Non productive. No sick contacts.  Denies sinus congestion/pain or ear pain.    Denies any fever/chills, vision changes/headaches, chest pain or palpitations, shortness of breath, abdominal pain, diarrhea, dysuria or leg swelling. No open wounds.   He does not smoke or drink. No hx of drugs.   ER Course:  vitals: temp: 101.7, bp: 124/98, HR: 114, RR: 21, oxygen: 92%RA Pertinent labs: none CT head: no acute intracranial process. . Stable right temporal and parietal encephalomalacia with ex vacuo dilatation of the right lateral ventricle. 3. Small bilateral mastoid effusions. CXR: chronic elevation of the left hemidiaphragm with increased density at the left lung  base. This could represent chronic volume loss or scarring but the possibility of a left base infiltrate is not excluded. In ED: neurology consulted. Sepsis activated. Treated with vanc, ampicillin, rocephin, acyclovir, decadron and keppra. Started on CIWA and given 1L IVF bolus. [facility] asked to admit.    Review of Systems: As mentioned in the history of present illness. All other systems reviewed and are negative. Past Medical History:  Diagnosis Date   Alcoholism (HCC)    History of, not active   Chronic hepatitis C (HCC)    Dementia (HCC)    Diverticulosis    Gait disorder    Gingival hypertrophy    Secondary to Dilantin   Hemiparesis and alteration of sensations as late effects of stroke (HCC) 01/25/2017   Left hemiparesis   Hiatal hernia    History of alcoholism (HCC)    Hx of ischemic vertebrobasilar artery thalamic stroke    Right   Internal hemorrhoids    Memory disorder 03/04/2013   Primary biliary cirrhosis (HCC)    Seizures (HCC)    Stroke (HCC)    Right frontal, right thalamic   Tubular adenoma of colon    Ulnar neuropathy of left upper extremity    Past Surgical History:  Procedure Laterality Date   COLONOSCOPY WITH PROPOFOL N/A 07/10/2017   Procedure: COLONOSCOPY WITH PROPOFOL;  Surgeon: Kathi Der, MD;  Location: [facility];  Service: Gastroenterology;  Laterality: N/A;   ESOPHAGOGASTRODUODENOSCOPY (EGD) WITH PROPOFOL  N/A 07/10/2017   Procedure: ESOPHAGOGASTRODUODENOSCOPY (EGD) WITH PROPOFOL;  Surgeon: Kathi Der, MD;  Location: [facility];  Service: Gastroenterology;  Laterality: N/A;   HIP ARTHROPLASTY Left 11/26/2013   Procedure: LEFT HIP HEMIARTHROPLASTY;  Surgeon: Kathryne Hitch, MD;  Location: [facility] OR;  Service: Orthopedics;  Laterality: Left;   WRIST SURGERY Left 95 & 96   Social History:  reports that he quit smoking about 9 years ago. His smoking use included cigarettes. He smoked an average of 3 packs per day. He has quit using  smokeless tobacco. He reports that he does not drink alcohol and does not use drugs.  No Known Allergies  Family History  Problem Relation Age of Onset   Pulmonary embolism Mother    Liver disease Father    Diabetes Sister    Hypertension Sister    Hypertension Sister    Hypertension Sister    Seizures Neg Hx     Prior to Admission medications   Medication Sig Start Date End Date Taking? Authorizing Provider  aspirin EC 81 MG tablet Take 1 tablet (81 mg total) by mouth daily. Patient not taking: Reported on 09/08/2022 03/04/20   Marcine Matar, MD  cyclobenzaprine (FLEXERIL) 5 MG tablet Take 1 tablet (5 mg total) by mouth at bedtime. FOR LEG CRAMPS 12/18/22   Claiborne Rigg, NP  levETIRAcetam (KEPPRA) 750 MG tablet TAKE 2 TABLETS (1,500 MG TOTAL) BY MOUTH 2 (TWO) TIMES DAILY. 02/04/23   Glean Salvo, NP  memantine (NAMENDA) 10 MG tablet Take 1 tablet (10 mg total) by mouth 2 (two) times daily. 02/12/23   Marcine Matar, MD  pantoprazole (PROTONIX) 40 MG tablet Take 1 tablet (40 mg total) by mouth daily. Patient not taking: Reported on 09/08/2022 12/25/21   Beverley Fiedler, MD  phenytoin (DILANTIN) 100 MG ER capsule Take 2 capsules (200 mg total) by mouth at bedtime. FOR SEIZURES 02/19/23   Hoy Register, MD  simvastatin (ZOCOR) 20 MG tablet Take 1 tablet (20 mg total) by mouth at bedtime. FOR CHOLESTEROL 12/18/22   Claiborne Rigg, NP  ursodiol (ACTIGALL) 250 MG tablet TAKE 2 TABLETS BY MOUTH IN THE MORNING AND AT BEDTIME. NEEDS ENDO/COLON FOR ADDITIONAL REFILLS 04/25/22   Pyrtle, Carie Caddy, MD  ursodiol (ACTIGALL) 500 MG tablet Take 1 tablet (500 mg total) by mouth in the morning and at bedtime. 11/20/21   Arnaldo Natal, NP    Physical Exam: Vitals:   02/23/23 1419 02/23/23 1451 02/23/23 1607 02/23/23 1845  BP: (!) 124/98  117/75 98/68  Pulse: (!) 114  95 94  Resp: (!) 21  (!) 22 18  Temp: (!) 101.7 F (38.7 C)  (!) 100.5 F (38.1 C) 98.1 F (36.7 C)  TempSrc:  Axillary  Oral Oral  SpO2: 92%  95% 98%  Weight:  70 kg     General:  Appears calm and comfortable and is in NAD Eyes:  PERRL, EOMI, normal lids, iris ENT:  grossly normal hearing, lips & tongue, mmm; appropriate dentition Neck:  no LAD, masses or thyromegaly; no carotid bruits Cardiovascular:  RRR, no m/r/g. No LE edema.  Respiratory:   CTA bilaterally with no wheezes/rales/rhonchi.  Normal respiratory effort. Abdomen:  soft, NT, ND, NABS Back:   normal alignment, no CVAT Skin:  no rash or induration seen on limited exam Musculoskeletal:  grossly normal tone BUE/BLE, weaker on LUE/LLE at baseline.  good ROM, no bony abnormality. no neck rigidity Lower extremity:  No LE edema.  Limited foot exam with no ulcerations.  2+ distal pulses. Psychiatric:  grossly normal mood and affect, speech fluent and appropriate, Aox2-baseline  Neurologic:  CN 2-12 grossly intact, moves all extremities in coordinated fashion, sensation intact   Radiological Exams on Admission: Independently reviewed - see discussion in A/P where applicable  CT HEAD WO CONTRAST ( )  Result Date: 02/23/2023 CLINICAL DATA:  Altered level of consciousness, meningitis suspected EXAM: CT HEAD WITHOUT CONTRAST TECHNIQUE: Contiguous axial images were obtained from the base of the skull through the vertex without intravenous contrast. RADIATION DOSE REDUCTION: This exam was performed according to the departmental dose-optimization program which includes automated exposure control, adjustment of the mA and/or kV according to patient size and/or use of iterative reconstruction technique. COMPARISON:  03/26/2022, 07/26/2020 FINDINGS: Brain: Stable encephalomalacia of the right temporal and parietal lobes, with ex vacuo dilatation of the underlying right lateral ventricle. No evidence of acute infarct or hemorrhage. Lateral ventricles and midline structures are stable. No acute extra-axial fluid collections. No mass effect. Vascular: No  hyperdense vessel or unexpected calcification. Skull: Normal. Negative for fracture or focal lesion. Sinuses/Orbits: The paranasal sinuses are unremarkable. Small bilateral mastoid effusions are incidentally noted. Other: None. IMPRESSION: 1. No acute intracranial process. 2. Stable right temporal and parietal encephalomalacia with ex vacuo dilatation of the right lateral ventricle. 3. Small bilateral mastoid effusions. Electronically Signed   By: Sharlet Salina M.D.   On: 02/23/2023 16:02   DG Chest Port 1 View  Result Date: 02/23/2023 CLINICAL DATA:  Possible sepsis.  Possible seizure. EXAM: PORTABLE CHEST 1 VIEW COMPARISON:  11/29/2013 FINDINGS: Artifact overlies the chest. Heart size is normal. Mediastinal shadows are normal. The right lung is clear. There is chronic elevation of the left hemidiaphragm with increased density at the left lung base. This could represent chronic volume loss or scarring but the possibility of a left base infiltrate is not excluded. Consider two-view chest radiography when able. No visible effusion. IMPRESSION: Chronic elevation of the left hemidiaphragm with increased density at the left lung base. This could represent chronic volume loss or scarring but the possibility of a left base infiltrate is not excluded. Electronically Signed   By: Paulina Fusi M.D.   On: 02/23/2023 14:53    EKG: Independently reviewed.  Sinus tachycardia with rate 113; nonspecific ST changes with no evidence of acute ischemia   Labs on Admission: I have personally reviewed the available labs and imaging studies at the time of the admission.  Pertinent labs:   None   Assessment and Plan: Principal Problem:   Sepsis (HCC) Active Problems:   break through seizure   Memory disorder   Hemiparesis and alteration of sensations as late effects of stroke (HCC)   Barrett's esophagus    Assessment and Plan: * Sepsis (HCC) 72 year old male presenting with breakthrough seizure found to have  sepsis with fever to 101.7, tachycardia and tachypnea with possible left lower lobe infiltrate -admit to progressive -continue azithromycin and rocephin. Urine clean, no diarrhea/stomach pain and no open sores. CXR with possible infiltrate in LLL. He has been having a cough x 2 weeks -initially treated for meningitis as he was not back to baseline on initial exam by EDP, but he had just had a seizure on his way here. Patient is back to baseline and neurology discussed okay to defer LP and antibiotics for meningitis. He has no neck rigidity/pain/confusion. Likely was not back to baseline following seizure. -check urinary antigens -initial PCT wnl, 2nd  pending.  -check RVP  -BC pending, lactic acid wnl  -IVF -repeat 2V CXR in AM -trend fever curve and CBC   break through seizure In setting of sepsis and not taking his dilantin even though level wnl  -continue keppra and dilantin -seizure precautions -frequent neuro checks  -no tongue biting/confusion/lethargy  -nephew denies any aspiration symptoms -aspiration precautions -check mag/CK  -ativan PRN for breakthrough seizure    Memory disorder Baseline he converses a little. He can feed himself and bath himself.  Back to baseline, A&O x2, answers questions, follows directions.  Continue namenda BID  Delirium precautions   Hemiparesis and alteration of sensations as late effects of stroke (HCC) At baseline, no acute neurological deficits at time of my exam  Continue ASA, statin   Barrett's esophagus Short segment without dysplasia --diagnosis March 2023 --PPI started at that time; repeat EGD recommended March 2026 for surveillance  Has not been taking his PPI, reinitiate today      Advance Care Planning:   Code Status: Full Code   Consults: neurology   DVT Prophylaxis: lovenox   Family Communication: nephew at bedside: [name]   Severity of Illness: The appropriate patient status for this patient is INPATIENT.  Inpatient status is judged to be reasonable and necessary in order to provide the required intensity of service to ensure the patient's safety. The patient's presenting symptoms, physical exam findings, and initial radiographic and laboratory data in the context of their chronic comorbidities is felt to place them at high risk for further clinical deterioration. Furthermore, it is not anticipated that the patient will be medically stable for discharge from the hospital within 2 midnights of admission.   * I certify that at the point of admission it is my clinical judgment that the patient will require inpatient hospital care spanning beyond 2 midnights from the point of admission due to high intensity of service, high risk for further deterioration and high frequency of surveillance required.*  Author: Orland Mustard, MD 02/23/2023 7:19 PM  For on call review [email or web address].

## 2023-02-23 NOTE — ED Notes (Signed)
ED TO INPATIENT HANDOFF REPORT  Name/Age/Gender [name] 72 y.o. male  Code Status Code Status History     Date Active Date Inactive Code Status Order ID Comments User Context   11/26/2013 2158 12/01/2013 1925 Full Code [id number]  Alba Cory, MD Inpatient       Home/SNF/Other Home  Chief Complaint Sepsis [facility]) [A41.9]  Level of Care/Admitting Diagnosis ED Disposition     ED Disposition  Admit   Condition  --   Comment  Hospital Area: [facility] [[id number]]  Level of Care: Progressive [102]  Admit to Progressive based on following criteria: NEUROLOGICAL AND NEUROSURGICAL complex patients with significant risk of instability, who do not meet ICU criteria, yet require close observation or frequent assessment (< / = every 2 - 4 hours) with medical / nursing intervention.  Admit to Progressive based on following criteria: MULTISYSTEM THREATS such as stable sepsis, metabolic/electrolyte imbalance with or without encephalopathy that is responding to early treatment.  May admit patient to [facility] or [facility] if equivalent level of care is available:: Yes  Covid Evaluation: Confirmed COVID Negative  Diagnosis: Sepsis [facility]) [[id number]]  Admitting Physician: Orland Mustard [[id number]]  Attending Physician: Orland Mustard [phone]  Certification:: I certify this patient will need inpatient services for at least 2 midnights  Estimated Length of Stay: 3          Medical History Past Medical History:  Diagnosis Date   Alcoholism (HCC)    History of, not active   Chronic hepatitis C (HCC)    Dementia (HCC)    Diverticulosis    Gait disorder    Gingival hypertrophy    Secondary to Dilantin   Hemiparesis and alteration of sensations as late effects of stroke (HCC) 01/25/2017   Left hemiparesis   Hiatal hernia    History of alcoholism (HCC)    Hx of ischemic vertebrobasilar artery thalamic stroke    Right   Internal hemorrhoids    Memory  disorder 03/04/2013   Primary biliary cirrhosis (HCC)    Seizures (HCC)    Stroke (HCC)    Right frontal, right thalamic   Tubular adenoma of colon    Ulnar neuropathy of left upper extremity     Allergies No Known Allergies  IV Location/Drains/Wounds Patient Lines/Drains/Airways Status     Active Line/Drains/Airways     Name Placement date Placement time Site Days   Peripheral IV 02/23/23 20 G 1" Left Antecubital 02/23/23  1452  Antecubital  less than 1   Peripheral IV 02/23/23 18 G Right Antecubital 02/23/23  1452  Antecubital  less than 1            Labs/Imaging Results for orders placed or performed during the hospital encounter of 02/23/23 (from the past 48 hour(s))  CBG monitoring, ED     Status: Abnormal   Collection Time: 02/23/23  2:21 PM  Result Value Ref Range   Glucose-Capillary 140 (H) 70 - 99 mg/dL    Comment: Glucose reference range applies only to samples taken after fasting for at least 8 hours.  Resp panel by RT-PCR (RSV, Flu A&B, Covid) Peripheral     Status: None   Collection Time: 02/23/23  2:54 PM   Specimen: Peripheral; Nasal Swab  Result Value Ref Range   SARS Coronavirus 2 by RT PCR NEGATIVE NEGATIVE    Comment: (NOTE) SARS-CoV-2 target nucleic acids are NOT DETECTED.  The SARS-CoV-2 RNA is generally detectable in upper respiratory specimens during  the acute phase of infection. The lowest concentration of SARS-CoV-2 viral copies this assay can detect is 138 copies/mL. A negative result does not preclude SARS-Cov-2 infection and should not be used as the sole basis for treatment or other patient management decisions. A negative result may occur with  improper specimen collection/handling, submission of specimen other than nasopharyngeal swab, presence of viral mutation(s) within the areas targeted by this assay, and inadequate number of viral copies(<138 copies/mL). A negative result must be combined with clinical observations, patient  history, and epidemiological information. The expected result is Negative.  Fact Sheet for Patients:  BloggerCourse.com  Fact Sheet for Healthcare Providers:  SeriousBroker.it  This test is no t yet approved or cleared by the [place] FDA and  has been authorized for detection and/or diagnosis of SARS-CoV-2 by FDA under an Emergency Use Authorization (EUA). This EUA will remain  in effect (meaning this test can be used) for the duration of the COVID-19 declaration under Section 564(b)(1) of the Act, 21 U.S.C.section 360bbb-3(b)(1), unless the authorization is terminated  or revoked sooner.       Influenza A by PCR NEGATIVE NEGATIVE   Influenza B by PCR NEGATIVE NEGATIVE    Comment: (NOTE) The Xpert Xpress SARS-CoV-2/FLU/RSV plus assay is intended as an aid in the diagnosis of influenza from Nasopharyngeal swab specimens and should not be used as a sole basis for treatment. Nasal washings and aspirates are unacceptable for Xpert Xpress SARS-CoV-2/FLU/RSV testing.  Fact Sheet for Patients: BloggerCourse.com  Fact Sheet for Healthcare Providers: SeriousBroker.it  This test is not yet approved or cleared by the [place] FDA and has been authorized for detection and/or diagnosis of SARS-CoV-2 by FDA under an Emergency Use Authorization (EUA). This EUA will remain in effect (meaning this test can be used) for the duration of the COVID-19 declaration under Section 564(b)(1) of the Act, 21 U.S.C. section 360bbb-3(b)(1), unless the authorization is terminated or revoked.     Resp Syncytial Virus by PCR NEGATIVE NEGATIVE    Comment: (NOTE) Fact Sheet for Patients: BloggerCourse.com  Fact Sheet for Healthcare Providers: SeriousBroker.it  This test is not yet approved or cleared by the [place] FDA and has been  authorized for detection and/or diagnosis of SARS-CoV-2 by FDA under an Emergency Use Authorization (EUA). This EUA will remain in effect (meaning this test can be used) for the duration of the COVID-19 declaration under Section 564(b)(1) of the Act, 21 U.S.C. section 360bbb-3(b)(1), unless the authorization is terminated or revoked.  Performed at [facility], [address] [place], [place] [address]   Lactic acid, plasma     Status: None   Collection Time: 02/23/23  2:54 PM  Result Value Ref Range   Lactic Acid, Venous 1.6 0.5 - 1.9 mmol/L    Comment: Performed at [facility], [address], [place], [place] [address]  Comprehensive metabolic panel     Status: Abnormal   Collection Time: 02/23/23  2:54 PM  Result Value Ref Range   Sodium 137 135 - 145 mmol/L   Potassium 3.5 3.5 - 5.1 mmol/L   Chloride 105 98 - 111 mmol/L   CO2 24 22 - 32 mmol/L   Glucose, Bld 137 (H) 70 - 99 mg/dL    Comment: Glucose reference range applies only to samples taken after fasting for at least 8 hours.   BUN 18 8 - 23 mg/dL   Creatinine, Ser 0.98 0.61 - 1.24 mg/dL   Calcium 7.9 (  L) 8.9 - 10.3 mg/dL   Total Protein 7.3 6.5 - 8.1 g/dL   Albumin 3.7 3.5 - 5.0 g/dL   AST 17 15 - 41 U/L   ALT 18 0 - 44 U/L   Alkaline Phosphatase 130 (H) 38 - 126 U/L   Total Bilirubin 0.6 0.3 - 1.2 mg/dL   GFR, Estimated >16 >10 mL/min    Comment: (NOTE) Calculated using the CKD-EPI Creatinine Equation (2021)    Anion gap 8 5 - 15    Comment: Performed at [facility], [address] [place], [place] [address]  CBC with Differential     Status: Abnormal   Collection Time: 02/23/23  2:54 PM  Result Value Ref Range   WBC 5.6 4.0 - 10.5 K/uL   RBC 4.21 (L) 4.22 - 5.81 MIL/uL   Hemoglobin 13.7 13.0 - 17.0 g/dL   HCT 40.9 81.1 - 91.4 %   MCV 94.5 80.0 - 100.0 fL   MCH 32.5 26.0 - 34.0 pg   MCHC 34.4 30.0 - 36.0 g/dL   RDW 78.2 95.6 - 21.3 %   Platelets  152 150 - 400 K/uL   nRBC 0.0 0.0 - 0.2 %   Neutrophils Relative % 72 %   Neutro Abs 4.0 1.7 - 7.7 K/uL   Lymphocytes Relative 12 %   Lymphs Abs 0.7 0.7 - 4.0 K/uL   Monocytes Relative 15 %   Monocytes Absolute 0.8 0.1 - 1.0 K/uL   Eosinophils Relative 1 %   Eosinophils Absolute 0.0 0.0 - 0.5 K/uL   Basophils Relative 0 %   Basophils Absolute 0.0 0.0 - 0.1 K/uL   Immature Granulocytes 0 %   Abs Immature Granulocytes 0.01 0.00 - 0.07 K/uL    Comment: Performed at [facility], [address], [place], [place] [address]  Protime-INR     Status: None   Collection Time: 02/23/23  2:54 PM  Result Value Ref Range   Prothrombin Time 14.5 11.4 - 15.2 seconds   INR 1.1 0.8 - 1.2    Comment: (NOTE) INR goal varies based on device and disease states. Performed at [facility], [address] [place], [place] [address]   APTT     Status: None   Collection Time: 02/23/23  2:54 PM  Result Value Ref Range   aPTT 26 24 - 36 seconds    Comment: Performed at [facility], [address] [place], [place] [address]  Phenytoin level, total     Status: None   Collection Time: 02/23/23  2:54 PM  Result Value Ref Range   Phenytoin Lvl 18.9 10.0 - 20.0 ug/mL    Comment: Performed at [facility], [address], [place], [place] [address]   CT HEAD WO CONTRAST ( )  Result Date: 02/23/2023 CLINICAL DATA:  Altered level of consciousness, meningitis suspected EXAM: CT HEAD WITHOUT CONTRAST TECHNIQUE: Contiguous axial images were obtained from the base of the skull through the vertex without intravenous contrast. RADIATION DOSE REDUCTION: This exam was performed according to the departmental dose-optimization program which includes automated exposure control, adjustment of the mA and/or kV according to patient size and/or use of iterative reconstruction technique. COMPARISON:  03/26/2022, 07/26/2020 FINDINGS: Brain: Stable  encephalomalacia of the right temporal and parietal lobes, with ex vacuo dilatation of the underlying right lateral ventricle. No evidence of acute infarct or hemorrhage. Lateral ventricles and midline structures are stable. No acute extra-axial fluid collections. No mass  effect. Vascular: No hyperdense vessel or unexpected calcification. Skull: Normal. Negative for fracture or focal lesion. Sinuses/Orbits: The paranasal sinuses are unremarkable. Small bilateral mastoid effusions are incidentally noted. Other: None. IMPRESSION: 1. No acute intracranial process. 2. Stable right temporal and parietal encephalomalacia with ex vacuo dilatation of the right lateral ventricle. 3. Small bilateral mastoid effusions. Electronically Signed   By: Sharlet Salina M.D.   On: 02/23/2023 16:02   DG Chest Port 1 View  Result Date: 02/23/2023 CLINICAL DATA:  Possible sepsis.  Possible seizure. EXAM: PORTABLE CHEST 1 VIEW COMPARISON:  11/29/2013 FINDINGS: Artifact overlies the chest. Heart size is normal. Mediastinal shadows are normal. The right lung is clear. There is chronic elevation of the left hemidiaphragm with increased density at the left lung base. This could represent chronic volume loss or scarring but the possibility of a left base infiltrate is not excluded. Consider two-view chest radiography when [name]. No visible effusion. IMPRESSION: Chronic elevation of the left hemidiaphragm with increased density at the left lung base. This could represent chronic volume loss or scarring but the possibility of a left base infiltrate is not excluded. Electronically Signed   By: Paulina Fusi M.D.   On: 02/23/2023 14:53    Pending Labs Unresulted Labs (From admission, onward)     Start     Ordered   02/24/23 0500  Basic metabolic panel  Daily,   R      02/23/23 1459   02/23/23 1552  Procalcitonin  Once,   URGENT       References:    Procalcitonin Lower Respiratory Tract Infection AND Sepsis Procalcitonin Algorithm    02/23/23 1551   02/23/23 1545  Culture, blood (Routine X 2) w Reflex to ID Panel  Once,   R        02/23/23 1545   02/23/23 1545  Culture, blood (Routine X 2) w Reflex to ID Panel  Once,   R        02/23/23 1545   02/23/23 1431  HIV Antibody (routine testing w rflx)  (Meningitis Panel)  Once,   URGENT        02/23/23 1431   02/23/23 1429  Respiratory (~20 pathogens) panel by PCR  (Respiratory panel by PCR (~20 pathogens, ~24 hr TAT)  w precautions)  Once,   URGENT        02/23/23 1428   02/23/23 1422  Levetiracetam level  Once,   URGENT        02/23/23 1421   02/23/23 1418  Urinalysis, w/ Reflex to Culture (Infection Suspected) -Urine, Clean Catch  (Septic presentation on arrival (screening labs, nursing and treatment orders for obvious sepsis))  Once,   URGENT       Question:  Specimen Source  Answer:  Urine, Clean Catch   02/23/23 1418            Vitals/Pain Today's Vitals   02/23/23 1419 02/23/23 1420 02/23/23 1451 02/23/23 1607  BP: (!) 124/98   117/75  Pulse: (!) 114   95  Resp: (!) 21   (!) 22  Temp: (!) 101.7 F (38.7 C)   (!) 100.5 F (38.1 C)  TempSrc: Axillary   Oral  SpO2: 92%   95%  Weight:   70 kg   PainSc:  0-No pain      Isolation Precautions Droplet precaution  Medications Medications  lactated ringers infusion ( Intravenous New Bag/Given 02/23/23 1652)  lactated ringers bolus 1,000 mL (1,000 mLs Intravenous Not Given  02/23/23 1510)  acyclovir (ZOVIRAX) 700 mg in dextrose 5 % 100 mL IVPB (700 mg Intravenous New Bag/Given 02/23/23 1651)  LORazepam (ATIVAN) tablet 1-4 mg (has no administration in time range)  thiamine (VITAMIN B1) tablet 100 mg ( Oral See Alternative 02/23/23 1504)    Or  thiamine (VITAMIN B1) injection 100 mg (100 mg Intravenous Given 02/23/23 1504)  folic acid (FOLVITE) tablet 1 mg (1 mg Oral Given 02/23/23 1657)  multivitamin with minerals tablet 1 tablet (1 tablet Oral Given 02/23/23 1657)  vancomycin (VANCOREADY) IVPB 1500 mg/300 mL (1,500 mg  Intravenous New Bag/Given 02/23/23 1518)  dexamethasone (DECADRON) injection 10 mg (10 mg Intravenous Given 02/23/23 1504)  cefTRIAXone (ROCEPHIN) 2 g in sodium chloride 0.9 % 100 mL IVPB (0 g Intravenous Stopped 02/23/23 1517)  ampicillin (OMNIPEN) 2 g in sodium chloride 0.9 % 100 mL IVPB (2 g Intravenous New Bag/Given 02/23/23 1509)  levETIRAcetam (KEPPRA) IVPB 1500 mg/ 100 mL premix (0 mg Intravenous Stopped 02/23/23 1517)  sodium chloride 0.9 % bolus 1,000 mL (1,000 mLs Intravenous New Bag/Given 02/23/23 1511)  acetaminophen (TYLENOL) tablet 1,000 mg (1,000 mg Oral Given 02/23/23 1657)    Mobility non-ambulatory

## 2023-02-23 NOTE — Assessment & Plan Note (Addendum)
Baseline he converses a little. He can feed himself and bath himself.  Back to baseline, A&O x2, answers questions, follows directions.  Continue namenda BID  Delirium precautions

## 2023-02-23 NOTE — Progress Notes (Signed)
Pt arrived to unit. He is alert to self & hospital environment, not to specific hospital or time. Friend is at bedside. ER nurse stated inability to perform the STAT I&O cath & placed condom cath on pt out of necessity. There was 150 ml in bag, clear yellow.  Bladder scanned pt for a total of 5 ml. Discussed with HS RN during bedside report. Udunma, pt's night nurse secure chat & sent information to clinician covering for attending tonight. Documenting RN was included in secure chat, & noted Jolene Schimke, NP responded; it is not necessary to I&O cath at this time.

## 2023-02-23 NOTE — Sepsis Progress Note (Addendum)
Elink following code sepsis  1622-Message sent to bedside RN, with order history review it looks like blood cultures were ordered at time on code sepsis called ( 1418) and charted as completed, then 2 more sets ordered for 1545. I can't find any documentation of blood cultures collected prior to rocephin started.  1741- Bedside RN never responded to message but read icon shows that he opened the message. With chart review again I still do not see documentation of BC collected.

## 2023-02-23 NOTE — Assessment & Plan Note (Addendum)
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

## 2023-02-23 NOTE — ED Provider Notes (Signed)
[place] [facility] AT [facility] Provider Note   CSN: [id number] Arrival date & time: 02/23/23  1406     History  Chief Complaint  Patient presents with   Seizures    [name] is a 72 y.o. male.  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  The history is provided by the patient's cousin who also states that he and the patient's mother make all healthcare decisions for the patient.  He states that this morning the patient had a roughly 7-minute generalized tonic-clonic seizure.  He had another seizure that was generalized tonic-clonic and route to the emergency department.  He is not back to his baseline.  No sick contacts.  No known recent infectious concerns.  Has been acting normally and appropriately previously to his presentation this morning.  He has been out of his Dilantin for the past 2 weeks. The patient chronically has left hemibody weakness from his prior CVAs.   Home Medications Prior to Admission medications   Medication Sig Start Date End Date Taking? Authorizing Provider  aspirin EC 81 MG tablet Take 1 tablet (81 mg total) by mouth daily. Patient not taking: Reported on 09/08/2022 03/04/20   Marcine Matar, MD  cyclobenzaprine (FLEXERIL) 5 MG tablet Take 1 tablet (5 mg total) by mouth at bedtime. FOR LEG CRAMPS 12/18/22   Claiborne Rigg, NP  levETIRAcetam (KEPPRA) 750 MG tablet TAKE 2 TABLETS (1,500 MG TOTAL) BY MOUTH 2 (TWO) TIMES DAILY. 02/04/23   Glean Salvo, NP  memantine (NAMENDA) 10 MG tablet Take 1 tablet (10 mg total) by mouth 2 (two) times daily. 02/12/23   Marcine Matar, MD  pantoprazole (PROTONIX) 40 MG tablet Take 1 tablet (40  mg total) by mouth daily. Patient not taking: Reported on 09/08/2022 12/25/21   Beverley Fiedler, MD  phenytoin (DILANTIN) 100 MG ER capsule Take 2 capsules (200 mg total) by mouth at bedtime. FOR SEIZURES 02/19/23   Hoy Register, MD  simvastatin (ZOCOR) 20 MG tablet Take 1 tablet (20 mg total) by mouth at bedtime. FOR CHOLESTEROL 12/18/22   Claiborne Rigg, NP  ursodiol (ACTIGALL) 250 MG tablet TAKE 2 TABLETS BY MOUTH IN THE MORNING AND AT BEDTIME. NEEDS ENDO/COLON FOR ADDITIONAL REFILLS 04/25/22   Pyrtle, Carie Caddy, MD  ursodiol (ACTIGALL) 500 MG tablet Take 1 tablet (500 mg total) by mouth in the morning and at bedtime. 11/20/21   Arnaldo Natal, NP      Allergies    Patient has no known allergies.    Review of Systems   Review of Systems  Unable to perform ROS: Mental status change    Physical Exam Updated Vital Signs BP 117/75 (BP Location: Right Arm)   Pulse 95   Temp (!) 100.5 F (38.1 C) (Oral)   Resp (!) 22   Wt 70 kg   SpO2 95%   BMI 24.17 kg/m  Physical Exam Vitals and nursing note reviewed.  Constitutional:      General: He is not in acute distress.    Comments: GCS 12 (4-2-6), ABC intact  HENT:     Head: Normocephalic and atraumatic.  Eyes:     Conjunctiva/sclera: Conjunctivae normal.     Pupils: Pupils are equal, round,  and reactive to light.  Neck:     Comments: ROM of the neck passively intact, no clear rigidity Cardiovascular:     Rate and Rhythm: Regular rhythm. Tachycardia present.  Pulmonary:     Effort: Pulmonary effort is normal. No respiratory distress.  Abdominal:     General: There is no distension.     Tenderness: There is no abdominal tenderness. There is no guarding.  Musculoskeletal:        General: No deformity or signs of injury.     Cervical back: Neck supple.     Right lower leg: No edema.     Left lower leg: No edema.  Skin:    Findings: No lesion or rash.  Neurological:     Mental Status: He is alert.     Comments: MENTAL  STATUS EXAM:    Orientation: Alert and oriented to person, place, DISORIENTED TO YEAR Memory: Cooperative, follows commands well.  Language: Speech is clear and language is normal.   CRANIAL NERVES:    CN 2 (Optic): Visual fields intact to confrontation.  CN 3,4,6 (EOM): Pupils equal and reactive to light. Full extraocular eye movement without nystagmus.  CN 5 (Trigeminal): Facial sensation is normal, no weakness of masticatory muscles.  CN 7 (Facial): No facial weakness or asymmetry.  CN 8 (Auditory): Auditory acuity grossly normal.  CN 9,10 (Glossophar): The uvula is midline, the palate elevates symmetrically.  CN 11 (spinal access): Normal sternocleidomastoid and trapezius strength.  CN 12 (Hypoglossal): The tongue is midline. No atrophy or fasciculations.Marland Kitchen   MOTOR:  Muscle Strength: 5/5RUE, 4/5LUE, 5/5RLE, 5/5LLE.   COORDINATION:  No tremor.   SENSATION:   Intact to light touch all four extremities.  GAIT: Gait not assessed      ED Results / Procedures / Treatments   Labs (all labs ordered are listed, but only abnormal results are displayed) Labs Reviewed  COMPREHENSIVE METABOLIC PANEL - Abnormal; Notable for the following components:      Result Value   Glucose, Bld 137 (*)    Calcium 7.9 (*)    Alkaline Phosphatase 130 (*)    All other components within normal limits  CBC WITH DIFFERENTIAL/PLATELET - Abnormal; Notable for the following components:   RBC 4.21 (*)    All other components within normal limits  CBG MONITORING, ED - Abnormal; Notable for the following components:   Glucose-Capillary 140 (*)    All other components within normal limits  RESP PANEL BY RT-PCR (RSV, FLU A&B, COVID)  RVPGX2  RESPIRATORY PANEL BY PCR  CULTURE, BLOOD (ROUTINE X 2) W REFLEX TO ID PANEL  CULTURE, BLOOD (ROUTINE X 2) W REFLEX TO ID PANEL  LACTIC ACID, PLASMA  PROTIME-INR  APTT  PHENYTOIN LEVEL, TOTAL  URINALYSIS, W/ REFLEX TO CULTURE (INFECTION SUSPECTED)  LEVETIRACETAM  LEVEL  HIV ANTIBODY (ROUTINE TESTING W REFLEX)  PROCALCITONIN    EKG EKG Interpretation  Date/Time:  Saturday Feb 23 2023 14:17:00 EDT Ventricular Rate:  113 PR Interval:  182 QRS Duration: 93 QT Interval:  297 QTC Calculation: 408 R Axis:   80 Text Interpretation: Sinus tachycardia Confirmed by Ernie Avena ([id number]) on 02/23/2023 2:32:11 PM  Radiology CT HEAD WO CONTRAST ( )  Result Date: 02/23/2023 CLINICAL DATA:  Altered level of consciousness, meningitis suspected EXAM: CT HEAD WITHOUT CONTRAST TECHNIQUE: Contiguous axial images were obtained from the base of the skull through the vertex without intravenous contrast. RADIATION DOSE REDUCTION: This exam was performed according to the departmental dose-optimization program which  includes automated exposure control, adjustment of the mA and/or kV according to patient size and/or use of iterative reconstruction technique. COMPARISON:  03/26/2022, 07/26/2020 FINDINGS: Brain: Stable encephalomalacia of the right temporal and parietal lobes, with ex vacuo dilatation of the underlying right lateral ventricle. No evidence of acute infarct or hemorrhage. Lateral ventricles and midline structures are stable. No acute extra-axial fluid collections. No mass effect. Vascular: No hyperdense vessel or unexpected calcification. Skull: Normal. Negative for fracture or focal lesion. Sinuses/Orbits: The paranasal sinuses are unremarkable. Small bilateral mastoid effusions are incidentally noted. Other: None. IMPRESSION: 1. No acute intracranial process. 2. Stable right temporal and parietal encephalomalacia with ex vacuo dilatation of the right lateral ventricle. 3. Small bilateral mastoid effusions. Electronically Signed   By: Sharlet Salina M.D.   On: 02/23/2023 16:02   DG Chest Port 1 View  Result Date: 02/23/2023 CLINICAL DATA:  Possible sepsis.  Possible seizure. EXAM: PORTABLE CHEST 1 VIEW COMPARISON:  11/29/2013 FINDINGS: Artifact overlies the chest.  Heart size is normal. Mediastinal shadows are normal. The right lung is clear. There is chronic elevation of the left hemidiaphragm with increased density at the left lung base. This could represent chronic volume loss or scarring but the possibility of a left base infiltrate is not excluded. Consider two-view chest radiography when able. No visible effusion. IMPRESSION: Chronic elevation of the left hemidiaphragm with increased density at the left lung base. This could represent chronic volume loss or scarring but the possibility of a left base infiltrate is not excluded. Electronically Signed   By: Paulina Fusi M.D.   On: 02/23/2023 14:53    Procedures .Critical Care  Performed by: Ernie Avena, MD Authorized by: Ernie Avena, MD   Critical care provider statement:    Critical care time (minutes):  124   Critical care was necessary to treat or prevent imminent or life-threatening deterioration of the following conditions:  Sepsis and CNS failure or compromise   Critical care was time spent personally by me on the following activities:  Development of treatment plan with patient or surrogate, discussions with consultants, evaluation of patient's response to treatment, examination of patient, ordering and review of laboratory studies, ordering and review of radiographic studies, ordering and performing treatments and interventions, pulse oximetry, re-evaluation of patient's condition and review of old charts   Care discussed with: admitting provider       Medications Ordered in ED Medications  lactated ringers infusion (has no administration in time range)  lactated ringers bolus 1,000 mL (1,000 mLs Intravenous Not Given 02/23/23 1510)  acyclovir (ZOVIRAX) 700 mg in dextrose 5 % 100 mL IVPB (has no administration in time range)  LORazepam (ATIVAN) tablet 1-4 mg (has no administration in time range)  thiamine (VITAMIN B1) tablet 100 mg ( Oral See Alternative 02/23/23 1504)    Or  thiamine  (VITAMIN B1) injection 100 mg (100 mg Intravenous Given 02/23/23 1504)  folic acid (FOLVITE) tablet 1 mg (has no administration in time range)  multivitamin with minerals tablet 1 tablet (has no administration in time range)  vancomycin (VANCOREADY) IVPB 1500 mg/300 mL (1,500 mg Intravenous New Bag/Given 02/23/23 1518)  vancomycin (VANCOREADY) IVPB 750 mg/150 mL (has no administration in time range)  ampicillin (OMNIPEN) 2 g in sodium chloride 0.9 % 100 mL IVPB (has no administration in time range)  acetaminophen (TYLENOL) tablet 1,000 mg (has no administration in time range)  dexamethasone (DECADRON) injection 10 mg (10 mg Intravenous Given 02/23/23 1504)  cefTRIAXone (ROCEPHIN) 2 g  in sodium chloride 0.9 % 100 mL IVPB (0 g Intravenous Stopped 02/23/23 1517)  ampicillin (OMNIPEN) 2 g in sodium chloride 0.9 % 100 mL IVPB (2 g Intravenous New Bag/Given 02/23/23 1509)  levETIRAcetam (KEPPRA) IVPB 1500 mg/ 100 mL premix (0 mg Intravenous Stopped 02/23/23 1517)  sodium chloride 0.9 % bolus 1,000 mL (1,000 mLs Intravenous New Bag/Given 02/23/23 1511)    ED Course/ Medical Decision Making/ A&P Clinical Course as of 02/23/23 1615  Sat Feb 23, 2023  1421 Temp(!): 101.7 F (38.7 C) [JL]  1421 Pulse Rate(!): 114 [JL]  1432 Temp(!): 101.7 F (38.7 C) [JL]  1432 Pulse Rate(!): 114 [JL]    Clinical Course User Index [JL] Ernie Avena, MD                             Medical Decision Making Amount and/or Complexity of Data Reviewed Labs: ordered. Radiology: ordered. ECG/medicine tests: ordered.  Risk OTC drugs. Prescription drug management. Decision regarding hospitalization.   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  The history is provided by the patient's cousin who also states that he and the patient's mother make all healthcare decisions for the patient.  He states that this morning the patient had a roughly 7-minute generalized tonic-clonic seizure.  He had another seizure that was generalized tonic-clonic and route to the emergency department.  He is not back to his baseline.  No sick contacts.  No known recent infectious concerns.  Has been acting normally and appropriately previously to his presentation this morning.  He has been out of his Dilantin for the past 2 weeks. The patient chronically has left hemibody weakness from his prior CVAs.   On arrival, the patient was febrile to 101.7 axillary temp, tachycardic heart rate 114, tachypneic RR 21, BP 124/98, saturating 92% on room air.  Sinus tachycardia noted on cardiac telemetry.  Discal exam concerning for a 72 year old male with alteration in mental status, GCS 12, alert and oriented to name only, no active seizure-like activity with patient able to follow some commands.  Per family member bedside, the patient is not back to his mental status.  Code sepsis initiated on patient arrival due to fever and tachycardia with some concern for meningitis/encephalitis.  Differential diagnosis is broad at this time and includes breakthrough seizure in the setting of lack of medication use, meningitis, encephalitis, sepsis from other source such as respiratory, intra-abdominal, genitourinary.  IV access was obtained and the patient was covered for meningitis and encephalitis with IV acyclovir, ampicillin, Rocephin, vancomycin.  Was administered 10 mg of Decadron as well.  Fluid resuscitation was administered with a 1 L LR bolus.  Patient has remote history of alcohol use, placed on CIWA protocol.  Has been out of his Dilantin.  Dilantin and Keppra level administered.  Patient also loaded with Keppra 1500 mg.  CBG 140 on arrival.  EKG: Sinus tachycardia, ventricular  rate 113, no otherwise abnormal intervals, no acute ischemic changes noted.  CXR:  IMPRESSION:  Chronic elevation of the left hemidiaphragm with increased density  at the left lung base. This could represent chronic volume loss or  scarring but the possibility of a left base infiltrate is not  excluded.    CT  Head: IMPRESSION:  1. No acute intracranial process.  2. Stable right temporal and parietal encephalomalacia with ex vacuo  dilatation of the right lateral ventricle.  3. Small bilateral mastoid effusions.   On repeat evaluation, the patient had returned to his baseline neurologic status.  He had intact range of motion of the head and neck with no evidence of meningismus on exam.  While the patient was initially covered for meningitis and encephalitis, as he is now back to his neurologic baseline with intact range of motion of the head and neck without evidence of meningismus, I do not think meningitis and encephalitis is high enough on the differential to warrant a lumbar puncture.  The patient had not been taking his home Dilantin and presents with a fever.  He has a possible pneumonia seen on chest x-ray.  I discussed the care of the patient with Dr. Wilford Corner of neurology who agreed to hold on a lumbar puncture at this time, treat with antibiotics for pneumonia, continue to administer home Dilantin and Keppra after Keppra load and plan will be to admit the patient to the hospital for continued treatment and monitoring.  Hospitalist medicine consulted for admission, Dr. Artis Flock accepting.   Final Clinical Impression(s) / ED Diagnoses Final diagnoses:  Seizure (HCC)  Sepsis, due to unspecified organism, unspecified whether acute organ dysfunction present [facility])  Community acquired pneumonia of left lower lobe of lung    Rx / DC Orders ED Discharge Orders     None         Ernie Avena, MD 02/23/23 1659

## 2023-02-23 NOTE — Assessment & Plan Note (Signed)
At baseline, no acute neurological deficits at time of my exam  Continue ASA, statin

## 2023-02-23 NOTE — Progress Notes (Signed)
Review of pt's chart prior to admit to unit. Note pt has not urinated all day. He has had a bolus of IVF & continuous intake @ 150 ml/hr. Requested ER nurse to perform I&O cath prior to sending to unit. Will sign off, post response.

## 2023-02-23 NOTE — Progress Notes (Addendum)
A consult was received from an ED physician for Vancomycin, Ampicillin, and Acyclovir per pharmacy dosing.  The patient's profile has been reviewed for ht/wt/allergies/indication/available labs.    A one time order was placed for Vancomycin 1500mg  IV, Ampicillin 2g IV, and Acyclovir 700mg  (10mg /kg) IV. MD has already ordered IV fluids of LR at 123mL/hr, which will help reduce risk of nephrotoxicity with IV Acyclovir.   Initial suspicion was for meningitis. Discussed with Dr. Maurene Capes MD, upon reassessment, patient now back to baseline neurologic status, and MD has much lower suspicion for this now. LP cancelled. There is concern for possible pneumonia per MD.   Further antibiotics/pharmacy consults should be ordered by admitting physician if indicated.                       Thank you, Jamse Mead 02/23/2023  4:32 PM

## 2023-02-24 ENCOUNTER — Inpatient Hospital Stay (HOSPITAL_COMMUNITY): Payer: 59

## 2023-02-24 DIAGNOSIS — K22719 Barrett's esophagus with dysplasia, unspecified: Secondary | ICD-10-CM | POA: Diagnosis not present

## 2023-02-24 DIAGNOSIS — R413 Other amnesia: Secondary | ICD-10-CM

## 2023-02-24 DIAGNOSIS — R569 Unspecified convulsions: Secondary | ICD-10-CM

## 2023-02-24 DIAGNOSIS — I69398 Other sequelae of cerebral infarction: Secondary | ICD-10-CM

## 2023-02-24 DIAGNOSIS — I10 Essential (primary) hypertension: Secondary | ICD-10-CM

## 2023-02-24 DIAGNOSIS — I69359 Hemiplegia and hemiparesis following cerebral infarction affecting unspecified side: Secondary | ICD-10-CM

## 2023-02-24 LAB — CBC
HCT: 34.2 % — ABNORMAL LOW (ref 39.0–52.0)
Hemoglobin: 11.9 g/dL — ABNORMAL LOW (ref 13.0–17.0)
MCH: 33 pg (ref 26.0–34.0)
MCHC: 34.8 g/dL (ref 30.0–36.0)
MCV: 94.7 fL (ref 80.0–100.0)
Platelets: 142 10*3/uL — ABNORMAL LOW (ref 150–400)
RBC: 3.61 MIL/uL — ABNORMAL LOW (ref 4.22–5.81)
RDW: 11.9 % (ref 11.5–15.5)
WBC: 5.1 10*3/uL (ref 4.0–10.5)
nRBC: 0 % (ref 0.0–0.2)

## 2023-02-24 LAB — BASIC METABOLIC PANEL
Anion gap: 7 (ref 5–15)
BUN: 16 mg/dL (ref 8–23)
CO2: 24 mmol/L (ref 22–32)
Calcium: 7.8 mg/dL — ABNORMAL LOW (ref 8.9–10.3)
Chloride: 105 mmol/L (ref 98–111)
Creatinine, Ser: 0.92 mg/dL (ref 0.61–1.24)
GFR, Estimated: >60 mL/min (ref >60)
Glucose, Bld: 93 mg/dL (ref 70–99)
Potassium: 3.8 mmol/L (ref 3.5–5.1)
Sodium: 136 mmol/L (ref 135–145)

## 2023-02-24 LAB — PROCALCITONIN: Procalcitonin: <0.1 ng/mL

## 2023-02-24 LAB — CULTURE, BLOOD (ROUTINE X 2)

## 2023-02-24 LAB — MAGNESIUM: Magnesium: 2.1 mg/dL (ref 1.7–2.4)

## 2023-02-24 LAB — STREP PNEUMONIAE URINARY ANTIGEN: Strep Pneumo Urinary Antigen: NEGATIVE

## 2023-02-24 NOTE — Progress Notes (Signed)
PROGRESS NOTE    [name]  [name] DOB: Nov 11, 1950 DOA: 02/23/2023 PCP: Marcine Matar, MD   Brief Narrative:  [name] is a 72 y.o. male with medical history significant of hx of CVA with hemiparesis, seizure disorder, HTN, barrett's esophagus, liver fibrosis, hx of alcohol abuse, dementia who has been noncompliant with dilantin - he presented to ED with breakthrough seizures  Assessment & Plan:   Principal Problem:   Sepsis (HCC) Active Problems:   break through seizure   Memory disorder   Hemiparesis and alteration of sensations as late effects of stroke (HCC)   Barrett's esophagus   Acute break through seizure secondary to noncompliance -Has not had dilantin in 1 week per report. Recent dose increase without new Rx -Continue keppra and dilantin -Continue seizure precautions -frequent neuro checks per protocol -Appears to be back to baseline today - monitor for an additional 24 to 48 hours to ensure no further breakthrough or recurrent seizures. -ativan PRN for breakthrough seizure   Sepsis (HCC) ruled out - CXR personally reviewed - no clear infiltrate -patient declines any recent respiratory symptoms - Fever in the setting of seizure - DC antibiotics - procalcitonin negative x2  - Tachycardia/tachypnea likely secondary to seizure - Initially treated for meningitis given mental status changes but given rapid recovery Neuro does not feel an LP is indicated -no further indication for meningitis treatment - Respiratory panel negative - Strep Ag negative, HIV nonreactive, no leukocytosis  Memory disorder/Alzheimer's - Baseline limited conversation, able to perform ADLs including feeding and bathing -Currently back to baseline, A&O x2, answers questions appropriately, follows directions - nephew confirms he is at baseline. -Continue namenda BID  -Delirium precautions    Hemiparesis and alteration of sensations as late effects of stroke (HCC) At  baseline, no acute neurological deficits at time of my exam  Continue ASA, statin    Barrett's esophagus Short segment without dysplasia --diagnosis March 2023 --PPI started at that time; repeat EGD recommended March 2026 for surveillance  Has not been taking his PPI, reinitiate today   DVT prophylaxis: lovenox Code Status: Full Family Communication: Discussed with nephew  Status is: Inpt  Dispo: The patient is from: Home              Anticipated d/c is to: Same              Anticipated d/c date is: 24 hours              Patient currently not medically stable for discharge  Consultants:  Neurology  Procedures:  None  Antimicrobials:  Discontinued  Subjective: No acute issues or events overnight, patient is back to baseline - review of systems somewhat limited but denies pain shortness of breath headache fevers or chills.  Denies any recent cough shortness of breath symptoms, denies fevers chills, denies urinary issues or problems defecating.  Objective: Vitals:   02/23/23 1845 02/23/23 2233 02/24/23 0245 02/24/23 0459  BP: 98/68 (!) 90/54 102/75 (!) 97/55  Pulse: 94 87 84 76  Resp: 18 19 18 16   Temp: 98.1 F (36.7 C) 98.4 F (36.9 C) 98.4 F (36.9 C) 98 F (36.7 C)  TempSrc: Oral Oral Oral Oral  SpO2: 98% 100% 98% 98%  Weight:        Intake/Output Summary (Last 24 hours) at 02/24/2023 0731 Last data filed at 02/24/2023 0600 Gross per 24 hour  Intake 200 ml  Output 450 ml  Net -250 ml   Ceasar Mons  Weights   02/23/23 1451  Weight: 70 kg    Examination:  General:  Pleasantly resting in bed, No acute distress.  Alert to person and general situation(his baseline) HEENT:  Normocephalic atraumatic.  Sclerae nonicteric, noninjected.  Extraocular movements intact bilaterally. Neck:  Without mass or deformity.  Trachea is midline. Lungs:  Clear to auscultate bilaterally without rhonchi, wheeze, or rales. Heart:  Regular rate and rhythm.  Without murmurs, rubs, or  gallops. Abdomen:  Soft, nontender, nondistended.  Without guarding or rebound. Extremities: Without cyanosis, clubbing, edema, or obvious deformity. Skin:  Warm and dry, no erythema  Data Reviewed: I have personally reviewed following labs and imaging studies  CBC: Recent Labs  Lab 02/23/23 1454 02/24/23 0541  WBC 5.6 5.1  NEUTROABS 4.0  --   HGB 13.7 11.9*  HCT 39.8 34.2*  MCV 94.5 94.7  PLT 152 142*   Basic Metabolic Panel: Recent Labs  Lab 02/23/23 1454 02/23/23 1915 02/24/23 0541  NA 137  --  136  K 3.5  --  3.8  CL 105  --  105  CO2 24  --  24  GLUCOSE 137*  --  93  BUN 18  --  16  CREATININE 1.11  --  0.92  CALCIUM 7.9*  --  7.8*  MG  --  1.5* 2.1   GFR: Estimated Creatinine Clearance: 69.9 mL/min (by C-G formula based on SCr of 0.92 mg/dL). Liver Function Tests: Recent Labs  Lab 02/23/23 1454  AST 17  ALT 18  ALKPHOS 130*  BILITOT 0.6  PROT 7.3  ALBUMIN 3.7   Coagulation Profile: Recent Labs  Lab 02/23/23 1454  INR 1.1   Cardiac Enzymes: Recent Labs  Lab 02/23/23 1915  CKTOTAL 48*   CBG: Recent Labs  Lab 02/23/23 1421  GLUCAP 140*   Sepsis Labs: Recent Labs  Lab 02/23/23 1454 02/24/23 0541  PROCALCITON <0.10 <0.10  LATICACIDVEN 1.6  --     Recent Results (from the past 240 hour(s))  Resp panel by RT-PCR (RSV, Flu A&B, Covid) Peripheral     Status: None   Collection Time: 02/23/23  2:54 PM   Specimen: Peripheral; Nasal Swab  Result Value Ref Range Status   SARS Coronavirus 2 by RT PCR NEGATIVE NEGATIVE Final    Comment: (NOTE) SARS-CoV-2 target nucleic acids are NOT DETECTED.  The SARS-CoV-2 RNA is generally detectable in upper respiratory specimens during the acute phase of infection. The lowest concentration of SARS-CoV-2 viral copies this assay can detect is 138 copies/mL. A negative result does not preclude SARS-Cov-2 infection and should not be used as the sole basis for treatment or other patient management  decisions. A negative result may occur with  improper specimen collection/handling, submission of specimen other than nasopharyngeal swab, presence of viral mutation(s) within the areas targeted by this assay, and inadequate number of viral copies(<138 copies/mL). A negative result must be combined with clinical observations, patient history, and epidemiological information. The expected result is Negative.  Fact Sheet for Patients:  BloggerCourse.com  Fact Sheet for Healthcare Providers:  SeriousBroker.it  This test is no t yet approved or cleared by the [place] FDA and  has been authorized for detection and/or diagnosis of SARS-CoV-2 by FDA under an Emergency Use Authorization (EUA). This EUA will remain  in effect (meaning this test can be used) for the duration of the COVID-19 declaration under Section 564(b)(1) of the Act, 21 U.S.C.section 360bbb-3(b)(1), unless the authorization is terminated  or revoked sooner.  Influenza A by PCR NEGATIVE NEGATIVE Final   Influenza B by PCR NEGATIVE NEGATIVE Final    Comment: (NOTE) The Xpert Xpress SARS-CoV-2/FLU/RSV plus assay is intended as an aid in the diagnosis of influenza from Nasopharyngeal swab specimens and should not be used as a sole basis for treatment. Nasal washings and aspirates are unacceptable for Xpert Xpress SARS-CoV-2/FLU/RSV testing.  Fact Sheet for Patients: BloggerCourse.com  Fact Sheet for Healthcare Providers: SeriousBroker.it  This test is not yet approved or cleared by the [place] FDA and has been authorized for detection and/or diagnosis of SARS-CoV-2 by FDA under an Emergency Use Authorization (EUA). This EUA will remain in effect (meaning this test can be used) for the duration of the COVID-19 declaration under Section 564(b)(1) of the Act, 21 U.S.C. section 360bbb-3(b)(1), unless the  authorization is terminated or revoked.     Resp Syncytial Virus by PCR NEGATIVE NEGATIVE Final    Comment: (NOTE) Fact Sheet for Patients: BloggerCourse.com  Fact Sheet for Healthcare Providers: SeriousBroker.it  This test is not yet approved or cleared by the [place] FDA and has been authorized for detection and/or diagnosis of SARS-CoV-2 by FDA under an Emergency Use Authorization (EUA). This EUA will remain in effect (meaning this test can be used) for the duration of the COVID-19 declaration under Section 564(b)(1) of the Act, 21 U.S.C. section 360bbb-3(b)(1), unless the authorization is terminated or revoked.  Performed at [facility], [address] [place], [place] [address]   Respiratory (~20 pathogens) panel by PCR     Status: None   Collection Time: 02/23/23  2:54 PM   Specimen: Nasopharyngeal Swab; Respiratory  Result Value Ref Range Status   Adenovirus NOT DETECTED NOT DETECTED Final   Coronavirus 229E NOT DETECTED NOT DETECTED Final    Comment: (NOTE) The Coronavirus on the Respiratory Panel, DOES NOT test for the novel  Coronavirus (2019 nCoV)    Coronavirus HKU1 NOT DETECTED NOT DETECTED Final   Coronavirus NL63 NOT DETECTED NOT DETECTED Final   Coronavirus OC43 NOT DETECTED NOT DETECTED Final   Metapneumovirus NOT DETECTED NOT DETECTED Final   Rhinovirus / Enterovirus NOT DETECTED NOT DETECTED Final   Influenza A NOT DETECTED NOT DETECTED Final   Influenza B NOT DETECTED NOT DETECTED Final   Parainfluenza Virus 1 NOT DETECTED NOT DETECTED Final   Parainfluenza Virus 2 NOT DETECTED NOT DETECTED Final   Parainfluenza Virus 3 NOT DETECTED NOT DETECTED Final   Parainfluenza Virus 4 NOT DETECTED NOT DETECTED Final   Respiratory Syncytial Virus NOT DETECTED NOT DETECTED Final   Bordetella pertussis NOT DETECTED NOT DETECTED Final   Bordetella Parapertussis NOT DETECTED NOT DETECTED  Final   Chlamydophila pneumoniae NOT DETECTED NOT DETECTED Final   Mycoplasma pneumoniae NOT DETECTED NOT DETECTED Final    Comment: Performed at [facility], [address] [place], [place] [address]  Culture, blood (Routine X 2) w Reflex to ID Panel     Status: None (Preliminary result)   Collection Time: 02/23/23  7:15 PM   Specimen: BLOOD RIGHT HAND  Result Value Ref Range Status   Specimen Description   Final    BLOOD RIGHT HAND Performed at [facility], [address] [place], [place] [address]    Special Requests   Final    BOTTLES DRAWN AEROBIC ONLY Blood Culture results may not be optimal due to an inadequate volume of blood received in culture bottles Performed at [facility],  [address], [place], [place] [address]    Culture PENDING  Incomplete   Report Status PENDING  Incomplete  Culture, blood (Routine X 2) w Reflex to ID Panel     Status: None (Preliminary result)   Collection Time: 02/23/23  7:18 PM   Specimen: BLOOD LEFT HAND  Result Value Ref Range Status   Specimen Description   Final    BLOOD LEFT HAND Performed at [facility], [address], [place], [place] [address]    Special Requests   Final    BOTTLES DRAWN AEROBIC ONLY Blood Culture adequate volume Performed at [facility], [address], [place], [place] [address]    Culture PENDING  Incomplete   Report Status PENDING  Incomplete         Radiology Studies: CT HEAD WO CONTRAST ( )  Result Date: 02/23/2023 CLINICAL DATA:  Altered level of consciousness, meningitis suspected EXAM: CT HEAD WITHOUT CONTRAST TECHNIQUE: Contiguous axial images were obtained from the base of the skull through the vertex without intravenous contrast. RADIATION DOSE REDUCTION: This exam was performed according to the departmental dose-optimization program which includes automated exposure control, adjustment of the mA and/or kV according to patient size  and/or use of iterative reconstruction technique. COMPARISON:  03/26/2022, 07/26/2020 FINDINGS: Brain: Stable encephalomalacia of the right temporal and parietal lobes, with ex vacuo dilatation of the underlying right lateral ventricle. No evidence of acute infarct or hemorrhage. Lateral ventricles and midline structures are stable. No acute extra-axial fluid collections. No mass effect. Vascular: No hyperdense vessel or unexpected calcification. Skull: Normal. Negative for fracture or focal lesion. Sinuses/Orbits: The paranasal sinuses are unremarkable. Small bilateral mastoid effusions are incidentally noted. Other: None. IMPRESSION: 1. No acute intracranial process. 2. Stable right temporal and parietal encephalomalacia with ex vacuo dilatation of the right lateral ventricle. 3. Small bilateral mastoid effusions. Electronically Signed   By: Sharlet Salina M.D.   On: 02/23/2023 16:02   DG Chest Port 1 View  Result Date: 02/23/2023 CLINICAL DATA:  Possible sepsis.  Possible seizure. EXAM: PORTABLE CHEST 1 VIEW COMPARISON:  11/29/2013 FINDINGS: Artifact overlies the chest. Heart size is normal. Mediastinal shadows are normal. The right lung is clear. There is chronic elevation of the left hemidiaphragm with increased density at the left lung base. This could represent chronic volume loss or scarring but the possibility of a left base infiltrate is not excluded. Consider two-view chest radiography when able. No visible effusion. IMPRESSION: Chronic elevation of the left hemidiaphragm with increased density at the left lung base. This could represent chronic volume loss or scarring but the possibility of a left base infiltrate is not excluded. Electronically Signed   By: Paulina Fusi M.D.   On: 02/23/2023 14:53        Scheduled Meds:  aspirin EC  81 mg Oral Daily   enoxaparin (LOVENOX) injection  40 mg Subcutaneous Q24H   folic acid  1 mg Oral Daily   levETIRAcetam  1,500 mg Oral BID   memantine  10 mg  Oral BID   multivitamin with minerals  1 tablet Oral Daily   pantoprazole  40 mg Oral Daily   phenytoin  200 mg Oral QHS   simvastatin  20 mg Oral QHS   thiamine  100 mg Oral Daily   Or   thiamine  100 mg Intravenous Daily   Continuous Infusions:  azithromycin 500 mg (02/23/23 2104)   cefTRIAXone (ROCEPHIN)  IV     lactated  ringers     lactated ringers 150 mL/hr at 02/24/23 0710     LOS: 1 day   Time spent:  Azucena Fallen, DO [facility]  If 7PM-7AM, please contact night-coverage [email or web address]  02/24/2023, 7:31 AM

## 2023-02-24 NOTE — Progress Notes (Signed)
  Transition of Care [facility]) Screening Note   Patient Details  Name: JADIAN DROZ Date of Birth: [birth year]   Transition of Care [facility]) CM/SW Contact:    Lanier Clam, RN Phone Number: 02/24/2023, 2:44 PM    Transition of Care Department [facility]) has reviewed patient and no TOC needs have been identified at this time. We will continue to monitor patient advancement through interdisciplinary progression rounds. If new patient transition needs arise, please place a TOC consult.

## 2023-02-24 NOTE — Progress Notes (Signed)
Ambulated pt 150 ft. Over 10 minutes on RA. Pt maintained oxygen 94 %. Pt currently down for CXR. Plan: Have pt sit in chair upon return, & activate chair alarm.

## 2023-02-25 ENCOUNTER — Ambulatory Visit: Payer: Self-pay | Admitting: *Deleted

## 2023-02-25 DIAGNOSIS — R413 Other amnesia: Secondary | ICD-10-CM | POA: Diagnosis not present

## 2023-02-25 DIAGNOSIS — R569 Unspecified convulsions: Secondary | ICD-10-CM | POA: Diagnosis not present

## 2023-02-25 DIAGNOSIS — I69359 Hemiplegia and hemiparesis following cerebral infarction affecting unspecified side: Secondary | ICD-10-CM | POA: Diagnosis not present

## 2023-02-25 DIAGNOSIS — K22719 Barrett's esophagus with dysplasia, unspecified: Secondary | ICD-10-CM | POA: Diagnosis not present

## 2023-02-25 LAB — BASIC METABOLIC PANEL
Anion gap: 8 (ref 5–15)
BUN: 10 mg/dL (ref 8–23)
CO2: 24 mmol/L (ref 22–32)
Calcium: 7.9 mg/dL — ABNORMAL LOW (ref 8.9–10.3)
Chloride: 103 mmol/L (ref 98–111)
Creatinine, Ser: 0.86 mg/dL (ref 0.61–1.24)
GFR, Estimated: >60 mL/min (ref >60)
Glucose, Bld: 104 mg/dL — ABNORMAL HIGH (ref 70–99)
Potassium: 3.3 mmol/L — ABNORMAL LOW (ref 3.5–5.1)
Sodium: 135 mmol/L (ref 135–145)

## 2023-02-25 LAB — LEGIONELLA PNEUMOPHILA SEROGP 1 UR AG: L. pneumophila Serogp 1 Ur Ag: NEGATIVE

## 2023-02-25 LAB — CBC
HCT: 35 % — ABNORMAL LOW (ref 39.0–52.0)
Hemoglobin: 12.2 g/dL — ABNORMAL LOW (ref 13.0–17.0)
MCH: 32.6 pg (ref 26.0–34.0)
MCHC: 34.9 g/dL (ref 30.0–36.0)
MCV: 93.6 fL (ref 80.0–100.0)
Platelets: 152 10*3/uL (ref 150–400)
RBC: 3.74 MIL/uL — ABNORMAL LOW (ref 4.22–5.81)
RDW: 11.7 % (ref 11.5–15.5)
WBC: 6.2 10*3/uL (ref 4.0–10.5)
nRBC: 0 % (ref 0.0–0.2)

## 2023-02-25 LAB — CULTURE, BLOOD (ROUTINE X 2): Culture: NO GROWTH

## 2023-02-25 LAB — LEVETIRACETAM LEVEL: Levetiracetam Lvl: 27.9 ug/mL (ref 10.0–40.0)

## 2023-02-25 MED ORDER — LEVETIRACETAM 750 MG PO TABS: 1500.0000 mg | ORAL_TABLET | Freq: Two times a day (BID) | ORAL | 1 refills | Status: DC

## 2023-02-25 MED ORDER — VITAMIN B-1 100 MG PO TABS: 100.0000 mg | ORAL_TABLET | Freq: Every day | ORAL | 0 refills | Status: DC

## 2023-02-25 MED ORDER — FOLIC ACID 1 MG PO TABS: 1.0000 mg | ORAL_TABLET | Freq: Every day | ORAL | 0 refills | Status: DC

## 2023-02-25 MED ORDER — ADULT MULTIVITAMIN W/MINERALS CH: 1.0000 | ORAL_TABLET | Freq: Every day | ORAL | 0 refills | Status: DC

## 2023-02-25 MED ORDER — PHENYTOIN SODIUM EXTENDED 100 MG PO CAPS
200.0000 mg | ORAL_CAPSULE | Freq: Every day | ORAL | 2 refills | Status: AC
Start: 2023-02-25 — End: ?

## 2023-02-25 MED ORDER — PANTOPRAZOLE SODIUM 40 MG PO TBEC: 40.0000 mg | DELAYED_RELEASE_TABLET | Freq: Every day | ORAL | 1 refills | Status: DC

## 2023-02-25 NOTE — Telephone Encounter (Signed)
  Chief Complaint: Medication Dilantin Symptoms: Na Frequency: NA Pertinent Negatives: Patient denies NA Disposition: [] ED /[] Urgent Care (no appt availability in office) / [] Appointment(In office/virtual)/ []  [facility]/ [] Home Care/ [] Refused Recommended Disposition /[] [facility]/ [x]  Follow-up with PCP Additional Notes:  Pt's nephew [name] calling, chart notes helps with medications.  States in ED with seizure activity Sunday, released today. Some confusion regarding med refills. States was told "By nurse in office to take a whole one, so he will be out soon and have another seizure." Refill of 02/23/23 notes 100mg  #60 take 2 capsules, start 02/25/23, states "Didn't get that many."  Please advise. Also seems confused of dosage.  [id number]  Reason for Disposition  [1] Caller has URGENT medicine question about med that PCP or specialist prescribed AND [2] triager unable to answer question  Answer Assessment - Initial Assessment Questions 1. NAME of MEDICINE: "What medicine(s) are you calling about?"     Dilantin. 2. QUESTION: "What is your question?" (e.g., double dose of medicine, side effect)     Need refill, take 1/2 or whole  Protocols used: Medication Question Call-A-AH

## 2023-02-25 NOTE — Plan of Care (Signed)
  Problem: Activity: Goal: Ability to tolerate increased activity will improve Outcome: Completed/Met   Problem: Clinical Measurements: Goal: Ability to maintain a body temperature in the normal range will improve Outcome: Completed/Met   Problem: Respiratory: Goal: Ability to maintain adequate ventilation will improve Outcome: Completed/Met Goal: Ability to maintain a clear airway will improve Outcome: Completed/Met   Problem: Education: Goal: Expressions of having a comfortable level of knowledge regarding the disease process will increase Outcome: Completed/Met   Problem: Coping: Goal: Ability to adjust to condition or change in health will improve Outcome: Completed/Met Goal: Ability to identify appropriate support needs will improve Outcome: Completed/Met   Problem: Health Behavior/Discharge Planning: Goal: Compliance with prescribed medication regimen will improve Outcome: Completed/Met   Problem: Medication: Goal: Risk for medication side effects will decrease Outcome: Completed/Met   Problem: Clinical Measurements: Goal: Complications related to the disease process, condition or treatment will be avoided or minimized Outcome: Completed/Met Goal: Diagnostic test results will improve Outcome: Completed/Met   Problem: Safety: Goal: Verbalization of understanding the information provided will improve Outcome: Completed/Met   Problem: Self-Concept: Goal: Level of anxiety will decrease Outcome: Completed/Met Goal: Ability to verbalize feelings about condition will improve Outcome: Completed/Met   Problem: Education: Goal: Knowledge of General Education information will improve Description: Including pain rating scale, medication(s)/side effects and non-pharmacologic comfort measures Outcome: Completed/Met   Problem: Health Behavior/Discharge Planning: Goal: Ability to manage health-related needs will improve Outcome: Completed/Met   Problem: Clinical  Measurements: Goal: Ability to maintain clinical measurements within normal limits will improve Outcome: Completed/Met Goal: Will remain free from infection Outcome: Completed/Met Goal: Diagnostic test results will improve Outcome: Completed/Met Goal: Respiratory complications will improve Outcome: Completed/Met Goal: Cardiovascular complication will be avoided Outcome: Completed/Met   Problem: Activity: Goal: Risk for activity intolerance will decrease Outcome: Completed/Met   Problem: Nutrition: Goal: Adequate nutrition will be maintained Outcome: Completed/Met   Problem: Coping: Goal: Level of anxiety will decrease Outcome: Completed/Met   Problem: Elimination: Goal: Will not experience complications related to bowel motility Outcome: Completed/Met Goal: Will not experience complications related to urinary retention Outcome: Completed/Met   Problem: Pain Managment: Goal: General experience of comfort will improve Outcome: Completed/Met   Problem: Safety: Goal: Ability to remain free from injury will improve Outcome: Completed/Met   Problem: Skin Integrity: Goal: Risk for impaired skin integrity will decrease Outcome: Completed/Met

## 2023-02-25 NOTE — Progress Notes (Signed)
Patient received discharge orders to go home. Patient was given discharge paperwork/instructions. RN went over discharge paperwork with patient and patient's family. All questions or concerns were addressed and answered. Patient left the hospital stable, had discharge paperwork/instructions, and had all personal belongings.

## 2023-02-25 NOTE — Discharge Summary (Signed)
Physician Discharge Summary  [name] WGN:[id number] DOB: 12-31-1950 DOA: 02/23/2023  PCP: Marcine Matar, MD  Admit date: 02/23/2023 Discharge date: 02/25/2023  Admitted From: Home Disposition: Home  Recommendations for Outpatient Follow-up:  Follow up with PCP in 1-2 weeks Follow-up with neurology as scheduled  Home Health: None Equipment/Devices: None  Discharge Condition: Stable CODE STATUS: Full Diet recommendation: Low-salt low-fat diet, discontinue alcohol use  Brief/Interim Summary: [name] is a 72 y.o. male with medical history significant of hx of CVA with hemiparesis, seizure disorder, HTN, barrett's esophagus, liver fibrosis, hx of alcohol abuse, dementia who has been noncompliant with dilantin - he presented to ED with breakthrough seizures.  Patient admitted as above with acute breakthrough seizure secondary to noncompliance with Dilantin.  Interestingly Dilantin levels appear to be within range but patient reports noncompliance for nearly 1 week due to recent dose change without new prescription.  He was previously taking 100 mg at bedtime but has been transitioned to 200 mg at bedtime and by taking his increased dose he ran out of medications early.  At this time after reinitiation of medications patient appears to be at baseline no further episodes of seizure no further symptoms or complaints per the patient.  Otherwise stable and agreeable for discharge home.  Recommend close follow-up with PCP as well as neurology to ensure ongoing follow-up and management as below.  At intake patient had febrile episode concerning for infection however given multiple negative labs, negative imaging and no further symptoms infection has been ruled out antibiotics have been discontinued and patient continues to improve, currently at baseline.  Discharge Diagnoses:  Principal Problem:   Sepsis (HCC) Active Problems:   break through seizure   Memory disorder   Hemiparesis and  alteration of sensations as late effects of stroke (HCC)   Barrett's esophagus   Acute break through seizure secondary to noncompliance -Has not had dilantin in 1 week per report. Recent dose increase without new Rx -Continue keppra and dilantin   Sepsis (HCC) ruled out - CXR personally reviewed - no clear infiltrate -patient declines any recent respiratory symptoms - Fever in the setting of seizure -No indication for antibiotics - procalcitonin negative x2  - Tachycardia/tachypnea likely secondary to seizure - Initially treated for meningitis given mental status changes but given rapid recovery Neuro does not feel an LP is indicated -no further indication for meningitis treatment - Respiratory panel negative - Strep Ag negative, HIV nonreactive, no leukocytosis   Memory disorder/Alzheimer's - Baseline limited conversation, able to perform ADLs including feeding and bathing -Currently back to baseline, A&O x2, answers questions appropriately, follows directions - nephew confirms he is at baseline. -Continue namenda BID    Hemiparesis and alteration of sensations as late effects of stroke (HCC) At baseline, no acute neurological deficits at time of my exam  Continue ASA, statin    Barrett's esophagus Short segment without dysplasia --diagnosis March 2023 --PPI started at that time; repeat EGD recommended March 2026 for surveillance  Has not been taking his PPI, recommend continuation of home medications     Discharge Instructions   Allergies as of 02/25/2023   No Known Allergies      Medication List     STOP taking these medications    cyclobenzaprine 5 MG tablet Commonly known as: FLEXERIL   ursodiol 250 MG tablet Commonly known as: ACTIGALL   ursodiol 500 MG tablet Commonly known as: ACTIGALL       TAKE these medications  aspirin EC 81 MG tablet Take 1 tablet (81 mg total) by mouth daily.   folic acid 1 MG tablet Commonly known as: FOLVITE Take 1 tablet  (1 mg total) by mouth daily.   levETIRAcetam 750 MG tablet Commonly known as: KEPPRA Take 2 tablets (1,500 mg total) by mouth 2 (two) times daily.   memantine 10 MG tablet Commonly known as: NAMENDA Take 1 tablet (10 mg total) by mouth 2 (two) times daily.   multivitamin with minerals Tabs tablet Take 1 tablet by mouth daily.   pantoprazole 40 MG tablet Commonly known as: PROTONIX Take 1 tablet (40 mg total) by mouth daily.   phenytoin 100 MG ER capsule Commonly known as: DILANTIN Take 2 capsules (200 mg total) by mouth at bedtime. FOR SEIZURES   simvastatin 20 MG tablet Commonly known as: ZOCOR Take 1 tablet (20 mg total) by mouth at bedtime. FOR CHOLESTEROL   thiamine 100 MG tablet Commonly known as: Vitamin B-1 Take 1 tablet (100 mg total) by mouth daily.        No Known Allergies  Consultations: None  Procedures/Studies: DG Chest 2 View  Result Date: 02/24/2023 CLINICAL DATA:  Pneumonia.  Seizure yesterday. EXAM: CHEST - 2 VIEW COMPARISON:  02/23/2023. FINDINGS: Cardiac silhouette normal in size.  No mediastinal or hilar masses. Mild patchy airspace opacity noted at the left lung base. Mild stable scarring noted at the right apex. Lungs otherwise clear. No pleural effusion or pneumothorax. Skeletal structures grossly intact. IMPRESSION: 1. Small focus of airspace opacity at the left lung base suspicious for pneumonia. This is without significant change from the prior exam allowing for differences in patient positioning and technique. Electronically Signed   By: Amie Portland M.D.   On: 02/24/2023 08:48   CT HEAD WO CONTRAST ( )  Result Date: 02/23/2023 CLINICAL DATA:  Altered level of consciousness, meningitis suspected EXAM: CT HEAD WITHOUT CONTRAST TECHNIQUE: Contiguous axial images were obtained from the base of the skull through the vertex without intravenous contrast. RADIATION DOSE REDUCTION: This exam was performed according to the departmental  dose-optimization program which includes automated exposure control, adjustment of the mA and/or kV according to patient size and/or use of iterative reconstruction technique. COMPARISON:  03/26/2022, 07/26/2020 FINDINGS: Brain: Stable encephalomalacia of the right temporal and parietal lobes, with ex vacuo dilatation of the underlying right lateral ventricle. No evidence of acute infarct or hemorrhage. Lateral ventricles and midline structures are stable. No acute extra-axial fluid collections. No mass effect. Vascular: No hyperdense vessel or unexpected calcification. Skull: Normal. Negative for fracture or focal lesion. Sinuses/Orbits: The paranasal sinuses are unremarkable. Small bilateral mastoid effusions are incidentally noted. Other: None. IMPRESSION: 1. No acute intracranial process. 2. Stable right temporal and parietal encephalomalacia with ex vacuo dilatation of the right lateral ventricle. 3. Small bilateral mastoid effusions. Electronically Signed   By: Sharlet Salina M.D.   On: 02/23/2023 16:02   DG Chest Port 1 View  Result Date: 02/23/2023 CLINICAL DATA:  Possible sepsis.  Possible seizure. EXAM: PORTABLE CHEST 1 VIEW COMPARISON:  11/29/2013 FINDINGS: Artifact overlies the chest. Heart size is normal. Mediastinal shadows are normal. The right lung is clear. There is chronic elevation of the left hemidiaphragm with increased density at the left lung base. This could represent chronic volume loss or scarring but the possibility of a left base infiltrate is not excluded. Consider two-view chest radiography when able. No visible effusion. IMPRESSION: Chronic elevation of the left hemidiaphragm with increased density at the left  lung base. This could represent chronic volume loss or scarring but the possibility of a left base infiltrate is not excluded. Electronically Signed   By: Paulina Fusi M.D.   On: 02/23/2023 14:53     Subjective: No acute issues or events overnight   Discharge  Exam: Vitals:   02/24/23 2058 02/25/23 0434  BP: (!) 97/57 (!) 108/59  Pulse: 75 76  Resp: 18 18  Temp: 97.9 F (36.6 C) 98.1 F (36.7 C)  SpO2: 96% 100%   Vitals:   02/24/23 0459 02/24/23 1512 02/24/23 2058 02/25/23 0434  BP: (!) 97/55 98/68 (!) 97/57 (!) 108/59  Pulse: 76 77 75 76  Resp: 16 18 18 18   Temp: 98 F (36.7 C) 98.6 F (37 C) 97.9 F (36.6 C) 98.1 F (36.7 C)  TempSrc: Oral Oral Oral Oral  SpO2: 98% 90% 96% 100%  Weight:        General: Pt is alert, awake, not in acute distress Cardiovascular: RRR, S1/S2 +, no rubs, no gallops Respiratory: CTA bilaterally, no wheezing, no rhonchi Abdominal: Soft, NT, ND, bowel sounds + Extremities: no edema, no cyanosis    The results of significant diagnostics from this hospitalization (including imaging, microbiology, ancillary and laboratory) are listed below for reference.     Microbiology: Recent Results (from the past 240 hour(s))  Resp panel by RT-PCR (RSV, Flu A&B, Covid) Peripheral     Status: None   Collection Time: 02/23/23  2:54 PM   Specimen: Peripheral; Nasal Swab  Result Value Ref Range Status   SARS Coronavirus 2 by RT PCR NEGATIVE NEGATIVE Final    Comment: (NOTE) SARS-CoV-2 target nucleic acids are NOT DETECTED.  The SARS-CoV-2 RNA is generally detectable in upper respiratory specimens during the acute phase of infection. The lowest concentration of SARS-CoV-2 viral copies this assay can detect is 138 copies/mL. A negative result does not preclude SARS-Cov-2 infection and should not be used as the sole basis for treatment or other patient management decisions. A negative result may occur with  improper specimen collection/handling, submission of specimen other than nasopharyngeal swab, presence of viral mutation(s) within the areas targeted by this assay, and inadequate number of viral copies(<138 copies/mL). A negative result must be combined with clinical observations, patient history, and  epidemiological information. The expected result is Negative.  Fact Sheet for Patients:  BloggerCourse.com  Fact Sheet for Healthcare Providers:  SeriousBroker.it  This test is no t yet approved or cleared by the [place] FDA and  has been authorized for detection and/or diagnosis of SARS-CoV-2 by FDA under an Emergency Use Authorization (EUA). This EUA will remain  in effect (meaning this test can be used) for the duration of the COVID-19 declaration under Section 564(b)(1) of the Act, 21 U.S.C.section 360bbb-3(b)(1), unless the authorization is terminated  or revoked sooner.       Influenza A by PCR NEGATIVE NEGATIVE Final   Influenza B by PCR NEGATIVE NEGATIVE Final    Comment: (NOTE) The Xpert Xpress SARS-CoV-2/FLU/RSV plus assay is intended as an aid in the diagnosis of influenza from Nasopharyngeal swab specimens and should not be used as a sole basis for treatment. Nasal washings and aspirates are unacceptable for Xpert Xpress SARS-CoV-2/FLU/RSV testing.  Fact Sheet for Patients: BloggerCourse.com  Fact Sheet for Healthcare Providers: SeriousBroker.it  This test is not yet approved or cleared by the [place] FDA and has been authorized for detection and/or diagnosis of SARS-CoV-2 by FDA under an Emergency Use Authorization (EUA). This  EUA will remain in effect (meaning this test can be used) for the duration of the COVID-19 declaration under Section 564(b)(1) of the Act, 21 U.S.C. section 360bbb-3(b)(1), unless the authorization is terminated or revoked.     Resp Syncytial Virus by PCR NEGATIVE NEGATIVE Final    Comment: (NOTE) Fact Sheet for Patients: BloggerCourse.com  Fact Sheet for Healthcare Providers: SeriousBroker.it  This test is not yet approved or cleared by the [place] FDA and has been  authorized for detection and/or diagnosis of SARS-CoV-2 by FDA under an Emergency Use Authorization (EUA). This EUA will remain in effect (meaning this test can be used) for the duration of the COVID-19 declaration under Section 564(b)(1) of the Act, 21 U.S.C. section 360bbb-3(b)(1), unless the authorization is terminated or revoked.  Performed at [facility], [address], [place], [place] [address]   Respiratory (~20 pathogens) panel by PCR     Status: None   Collection Time: 02/23/23  2:54 PM   Specimen: Nasopharyngeal Swab; Respiratory  Result Value Ref Range Status   Adenovirus NOT DETECTED NOT DETECTED Final   Coronavirus 229E NOT DETECTED NOT DETECTED Final    Comment: (NOTE) The Coronavirus on the Respiratory Panel, DOES NOT test for the novel  Coronavirus (2019 nCoV)    Coronavirus HKU1 NOT DETECTED NOT DETECTED Final   Coronavirus NL63 NOT DETECTED NOT DETECTED Final   Coronavirus OC43 NOT DETECTED NOT DETECTED Final   Metapneumovirus NOT DETECTED NOT DETECTED Final   Rhinovirus / Enterovirus NOT DETECTED NOT DETECTED Final   Influenza A NOT DETECTED NOT DETECTED Final   Influenza B NOT DETECTED NOT DETECTED Final   Parainfluenza Virus 1 NOT DETECTED NOT DETECTED Final   Parainfluenza Virus 2 NOT DETECTED NOT DETECTED Final   Parainfluenza Virus 3 NOT DETECTED NOT DETECTED Final   Parainfluenza Virus 4 NOT DETECTED NOT DETECTED Final   Respiratory Syncytial Virus NOT DETECTED NOT DETECTED Final   Bordetella pertussis NOT DETECTED NOT DETECTED Final   Bordetella Parapertussis NOT DETECTED NOT DETECTED Final   Chlamydophila pneumoniae NOT DETECTED NOT DETECTED Final   Mycoplasma pneumoniae NOT DETECTED NOT DETECTED Final    Comment: Performed at [facility], [address], [place], [place] [address]  Culture, blood (Routine X 2) w Reflex to ID Panel     Status: None (Preliminary result)   Collection Time: 02/23/23  7:15 PM   Specimen: BLOOD  RIGHT HAND  Result Value Ref Range Status   Specimen Description   Final    BLOOD RIGHT HAND Performed at [facility], [address] [place], [place] [address]    Special Requests   Final    BOTTLES DRAWN AEROBIC ONLY Blood Culture results may not be optimal due to an inadequate volume of blood received in culture bottles Performed at [facility], [address], [place], [place] [address]    Culture   Final    NO GROWTH < 12 HOURS Performed at [facility], [address], [place], [place] [address]    Report Status PENDING  Incomplete  Culture, blood (Routine X 2) w Reflex to ID Panel     Status: None (Preliminary result)   Collection Time: 02/23/23  7:18 PM   Specimen: BLOOD LEFT HAND  Result Value Ref Range Status   Specimen Description   Final    BLOOD LEFT HAND Performed at [facility], [address] [place], [place] [address]  Special Requests   Final    BOTTLES DRAWN AEROBIC ONLY Blood Culture adequate volume Performed at [facility], [address], [place], [place] [address]    Culture   Final    NO GROWTH < 12 HOURS Performed at [facility], [address], [place], [place] [address]    Report Status PENDING  Incomplete     Labs: BNP (last 3 results) No results for input(s): "BNP" in the last 8760 hours. Basic Metabolic Panel: Recent Labs  Lab 02/23/23 1454 02/23/23 1915 02/24/23 0541 02/25/23 0519  NA 137  --  136 135  K 3.5  --  3.8 3.3*  CL 105  --  105 103  CO2 24  --  24 24  GLUCOSE 137*  --  93 104*  BUN 18  --  16 10  CREATININE 1.11  --  0.92 0.86  CALCIUM 7.9*  --  7.8* 7.9*  MG  --  1.5* 2.1  --    Liver Function Tests: Recent Labs  Lab 02/23/23 1454  AST 17  ALT 18  ALKPHOS 130*  BILITOT 0.6  PROT 7.3  ALBUMIN 3.7   No results for input(s): "LIPASE", "AMYLASE" in the last 168 hours. No results for input(s): "AMMONIA" in the last 168 hours. CBC: Recent Labs   Lab 02/23/23 1454 02/24/23 0541 02/25/23 0519  WBC 5.6 5.1 6.2  NEUTROABS 4.0  --   --   HGB 13.7 11.9* 12.2*  HCT 39.8 34.2* 35.0*  MCV 94.5 94.7 93.6  PLT 152 142* 152   Cardiac Enzymes: Recent Labs  Lab 02/23/23 1915  CKTOTAL 48*   BNP: Invalid input(s): "POCBNP" CBG: Recent Labs  Lab 02/23/23 1421  GLUCAP 140*   D-Dimer No results for input(s): "DDIMER" in the last 72 hours. Hgb A1c No results for input(s): "HGBA1C" in the last 72 hours. Lipid Profile No results for input(s): "CHOL", "HDL", "LDLCALC", "TRIG", "CHOLHDL", "LDLDIRECT" in the last 72 hours. Thyroid function studies No results for input(s): "TSH", "T4TOTAL", "T3FREE", "THYROIDAB" in the last 72 hours.  Invalid input(s): "FREET3" Anemia work up No results for input(s): "VITAMINB12", "FOLATE", "FERRITIN", "TIBC", "IRON", "RETICCTPCT" in the last 72 hours. Urinalysis    Component Value Date/Time   COLORURINE YELLOW 02/23/2023 1720   APPEARANCEUR CLEAR 02/23/2023 1720   LABSPEC 1.028 02/23/2023 1720   PHURINE 5.0 02/23/2023 1720   GLUCOSEU NEGATIVE 02/23/2023 1720   HGBUR NEGATIVE 02/23/2023 1720   BILIRUBINUR NEGATIVE 02/23/2023 1720   KETONESUR NEGATIVE 02/23/2023 1720   PROTEINUR NEGATIVE 02/23/2023 1720   UROBILINOGEN 1.0 01/01/2015 0736   NITRITE NEGATIVE 02/23/2023 1720   LEUKOCYTESUR NEGATIVE 02/23/2023 1720   Sepsis Labs Recent Labs  Lab 02/23/23 1454 02/24/23 0541 02/25/23 0519  WBC 5.6 5.1 6.2   Microbiology Recent Results (from the past 240 hour(s))  Resp panel by RT-PCR (RSV, Flu A&B, Covid) Peripheral     Status: None   Collection Time: 02/23/23  2:54 PM   Specimen: Peripheral; Nasal Swab  Result Value Ref Range Status   SARS Coronavirus 2 by RT PCR NEGATIVE NEGATIVE Final    Comment: (NOTE) SARS-CoV-2 target nucleic acids are NOT DETECTED.  The SARS-CoV-2 RNA is generally detectable in upper respiratory specimens during the acute phase of infection. The  lowest concentration of SARS-CoV-2 viral copies this assay can detect is 138 copies/mL. A negative result does not preclude SARS-Cov-2 infection and should not be used as the sole basis for treatment or other  patient management decisions. A negative result may occur with  improper specimen collection/handling, submission of specimen other than nasopharyngeal swab, presence of viral mutation(s) within the areas targeted by this assay, and inadequate number of viral copies(<138 copies/mL). A negative result must be combined with clinical observations, patient history, and epidemiological information. The expected result is Negative.  Fact Sheet for Patients:  BloggerCourse.com  Fact Sheet for Healthcare Providers:  SeriousBroker.it  This test is no t yet approved or cleared by the [place] FDA and  has been authorized for detection and/or diagnosis of SARS-CoV-2 by FDA under an Emergency Use Authorization (EUA). This EUA will remain  in effect (meaning this test can be used) for the duration of the COVID-19 declaration under Section 564(b)(1) of the Act, 21 U.S.C.section 360bbb-3(b)(1), unless the authorization is terminated  or revoked sooner.       Influenza A by PCR NEGATIVE NEGATIVE Final   Influenza B by PCR NEGATIVE NEGATIVE Final    Comment: (NOTE) The Xpert Xpress SARS-CoV-2/FLU/RSV plus assay is intended as an aid in the diagnosis of influenza from Nasopharyngeal swab specimens and should not be used as a sole basis for treatment. Nasal washings and aspirates are unacceptable for Xpert Xpress SARS-CoV-2/FLU/RSV testing.  Fact Sheet for Patients: BloggerCourse.com  Fact Sheet for Healthcare Providers: SeriousBroker.it  This test is not yet approved or cleared by the [place] FDA and has been authorized for detection and/or diagnosis of SARS-CoV-2 by FDA under  an Emergency Use Authorization (EUA). This EUA will remain in effect (meaning this test can be used) for the duration of the COVID-19 declaration under Section 564(b)(1) of the Act, 21 U.S.C. section 360bbb-3(b)(1), unless the authorization is terminated or revoked.     Resp Syncytial Virus by PCR NEGATIVE NEGATIVE Final    Comment: (NOTE) Fact Sheet for Patients: BloggerCourse.com  Fact Sheet for Healthcare Providers: SeriousBroker.it  This test is not yet approved or cleared by the [place] FDA and has been authorized for detection and/or diagnosis of SARS-CoV-2 by FDA under an Emergency Use Authorization (EUA). This EUA will remain in effect (meaning this test can be used) for the duration of the COVID-19 declaration under Section 564(b)(1) of the Act, 21 U.S.C. section 360bbb-3(b)(1), unless the authorization is terminated or revoked.  Performed at [facility], [address] [place], [place] [address]   Respiratory (~20 pathogens) panel by PCR     Status: None   Collection Time: 02/23/23  2:54 PM   Specimen: Nasopharyngeal Swab; Respiratory  Result Value Ref Range Status   Adenovirus NOT DETECTED NOT DETECTED Final   Coronavirus 229E NOT DETECTED NOT DETECTED Final    Comment: (NOTE) The Coronavirus on the Respiratory Panel, DOES NOT test for the novel  Coronavirus (2019 nCoV)    Coronavirus HKU1 NOT DETECTED NOT DETECTED Final   Coronavirus NL63 NOT DETECTED NOT DETECTED Final   Coronavirus OC43 NOT DETECTED NOT DETECTED Final   Metapneumovirus NOT DETECTED NOT DETECTED Final   Rhinovirus / Enterovirus NOT DETECTED NOT DETECTED Final   Influenza A NOT DETECTED NOT DETECTED Final   Influenza B NOT DETECTED NOT DETECTED Final   Parainfluenza Virus 1 NOT DETECTED NOT DETECTED Final   Parainfluenza Virus 2 NOT DETECTED NOT DETECTED Final   Parainfluenza Virus 3 NOT DETECTED NOT DETECTED Final    Parainfluenza Virus 4 NOT DETECTED NOT DETECTED Final   Respiratory Syncytial Virus NOT DETECTED NOT DETECTED Final   Bordetella pertussis NOT DETECTED NOT  DETECTED Final   Bordetella Parapertussis NOT DETECTED NOT DETECTED Final   Chlamydophila pneumoniae NOT DETECTED NOT DETECTED Final   Mycoplasma pneumoniae NOT DETECTED NOT DETECTED Final    Comment: Performed at [facility], [address], [place], [place] [address]  Culture, blood (Routine X 2) w Reflex to ID Panel     Status: None (Preliminary result)   Collection Time: 02/23/23  7:15 PM   Specimen: BLOOD RIGHT HAND  Result Value Ref Range Status   Specimen Description   Final    BLOOD RIGHT HAND Performed at [facility], [address], [place], [place] [address]    Special Requests   Final    BOTTLES DRAWN AEROBIC ONLY Blood Culture results may not be optimal due to an inadequate volume of blood received in culture bottles Performed at [facility], [address], [place], [place] [address]    Culture   Final    NO GROWTH < 12 HOURS Performed at [facility], [address], [place], [place] [address]    Report Status PENDING  Incomplete  Culture, blood (Routine X 2) w Reflex to ID Panel     Status: None (Preliminary result)   Collection Time: 02/23/23  7:18 PM   Specimen: BLOOD LEFT HAND  Result Value Ref Range Status   Specimen Description   Final    BLOOD LEFT HAND Performed at [facility], [address] [place], [place] [address]    Special Requests   Final    BOTTLES DRAWN AEROBIC ONLY Blood Culture adequate volume Performed at [facility], [address] [place], [place] [address]    Culture   Final    NO GROWTH < 12 HOURS Performed at [facility], [address], [place], [place] [address]    Report Status PENDING  Incomplete     Time coordinating discharge: Over 30 minutes  SIGNED:   Azucena Fallen, DO [facility] 02/25/2023, 7:37 AM Pager   If 7PM-7AM, please contact night-coverage [email or web address]

## 2023-02-25 NOTE — Telephone Encounter (Signed)
Call placed to patient unable to reach message left on VM.   

## 2023-02-26 ENCOUNTER — Telehealth: Payer: Self-pay

## 2023-02-26 ENCOUNTER — Telehealth: Payer: Self-pay | Admitting: Internal Medicine

## 2023-02-26 DIAGNOSIS — R569 Unspecified convulsions: Secondary | ICD-10-CM

## 2023-02-26 LAB — CULTURE, BLOOD (ROUTINE X 2): Special Requests: ADEQUATE

## 2023-02-26 NOTE — Transitions of Care (Post Inpatient/ED Visit) (Signed)
   02/26/2023  Name: [name] MRN: [id number] DOB: 08/02/1951  Today's TOC FU Call Status: Today's TOC FU Call Status:: Unsuccessul Call (1st Attempt) Unsuccessful Call (1st Attempt) Date: 02/26/23  Attempted to reach the patient regarding the most recent Inpatient/ED visit.  Follow Up Plan: Additional outreach attempts will be made to reach the patient to complete the Transitions of Care (Post Inpatient/ED visit) call.     Antionette Fairy, RN,BSN,CCM [facility] Care Management Care Management Community Coordinator Direct Phone: [phone] Toll Free: [phone] Fax: [phone]

## 2023-02-26 NOTE — Telephone Encounter (Signed)
Patient requesting chewable phenytoin.

## 2023-02-26 NOTE — Telephone Encounter (Signed)
Medication Refill - Medication: phenytoin (DILANTIN) chewable tablet 100 mg   Has the patient contacted their pharmacy? No. (Agent: If no, request that the patient contact the pharmacy for the refill. If patient does not wish to contact the pharmacy document the reason why and proceed with request.) (Agent: If yes, when and what did the pharmacy advise?)  Preferred Pharmacy (with phone number or street name):  [phone] [place], [place] - [address] Phone: [phone]  Fax: [phone]     Has the patient been seen for an appointment in the last year OR does the patient have an upcoming appointment? Yes.    Agent: Please be advised that RX refills may take up to 3 business days. We ask that you follow-up with your pharmacy.  *rx was sent in for capsule. Patient is also requesting the chewable form of medication.

## 2023-02-26 NOTE — Telephone Encounter (Signed)
See prior notes

## 2023-02-26 NOTE — Telephone Encounter (Signed)
Spoke with patient Nephew([name]). Verified patient's name & DOB  and DPR  [name] voiced that he help his uncle with taking medications. Had confusion about what the patient should be taking review medication on patient med list and also made [name] aware that he [facility] [address]. voiced understanding of all discussed .

## 2023-02-27 ENCOUNTER — Telehealth: Payer: Self-pay

## 2023-02-27 LAB — CULTURE, BLOOD (ROUTINE X 2)

## 2023-02-27 NOTE — Transitions of Care (Post Inpatient/ED Visit) (Signed)
   02/27/2023  Name: [name] MRN: [id number] DOB: 12-02-1950  Today's TOC FU Call Status: Today's TOC FU Call Status:: Unsuccessful Call (2nd Attempt) Unsuccessful Call (2nd Attempt) Date: 02/27/23  Attempted to reach the patient regarding the most recent Inpatient/ED visit.  Follow Up Plan: Additional outreach attempts will be made to reach the patient to complete the Transitions of Care (Post Inpatient/ED visit) call.      Antionette Fairy, RN,BSN,CCM [facility] Care Management Care Management Community Coordinator Direct Phone: [phone] Toll Free: [phone] Fax: [phone]

## 2023-02-28 ENCOUNTER — Encounter: Payer: Self-pay | Admitting: Podiatry

## 2023-02-28 ENCOUNTER — Ambulatory Visit (INDEPENDENT_AMBULATORY_CARE_PROVIDER_SITE_OTHER): Payer: 59 | Admitting: Podiatry

## 2023-02-28 ENCOUNTER — Telehealth: Payer: Self-pay

## 2023-02-28 DIAGNOSIS — M79675 Pain in left toe(s): Secondary | ICD-10-CM | POA: Diagnosis not present

## 2023-02-28 DIAGNOSIS — M79674 Pain in right toe(s): Secondary | ICD-10-CM

## 2023-02-28 DIAGNOSIS — B351 Tinea unguium: Secondary | ICD-10-CM | POA: Diagnosis not present

## 2023-02-28 LAB — CULTURE, BLOOD (ROUTINE X 2)

## 2023-02-28 NOTE — Transitions of Care (Post Inpatient/ED Visit) (Signed)
02/28/2023  Name: [name] MRN: [id number] DOB: [birth year]  Today's TOC FU Call Status: Today's TOC FU Call Status:: Successful TOC FU Call Competed TOC FU Call Complete Date: 02/28/23  Transition Care Management Follow-up Telephone Call Date of Discharge: 02/25/23 Discharge Facility: [facility]) Type of Discharge: Inpatient Admission Primary Inpatient Discharge Diagnosis:: "seizure" How have you been since you were released from the hospital?: Better (Call completed with nephew. He denies any acute sxs or issues.) Any questions or concerns?: Yes Patient Questions/Concerns:: Caregiver states "hospital did not give them the chewable 50mg  tablets of Dilantin." Patient Questions/Concerns Addressed: Other: (reviewed dosage with nephew- med had been increased during MD appt on 12/18/22 to 100mg  ER tabs(200mg  total) He is insistent that this is not correct and pt needs chewable 50mg  tab. He has already called PCP office and voiced he is going to office today)  Items Reviewed: Did you receive and understand the discharge instructions provided?: Yes Medications obtained,verified, and reconciled?: No Medications Not Reviewed Reasons:: Other: (unable to complete full med review as caregiver currently driving and not at home wtih meds) Any new allergies since your discharge?: No Dietary orders reviewed?: Yes Type of Diet Ordered:: low salt/heart healthy Do you have support at home?: Yes People in Home: other relative(s) Name of Support/Comfort Primary Source: [name]-nephew  Medications Reviewed Today: Medications Reviewed Today     Reviewed by [name], CPhT (Pharmacy Technician) on 02/23/23 at 1808  Med List Status: Complete   Medication Order Taking? Sig Documenting Provider Last Dose Status Informant  aspirin EC 81 MG tablet [id number] Yes Take 1 tablet (81 mg total) by mouth daily. Marcine Matar, MD 02/22/2023 Active Family Member  cyclobenzaprine (FLEXERIL) 5 MG tablet  [id number] Yes Take 1 tablet (5 mg total) by mouth at bedtime. FOR LEG CRAMPS Claiborne Rigg, NP 02/22/2023 Active Family Member  levETIRAcetam (KEPPRA) 750 MG tablet [id number] Yes TAKE 2 TABLETS (1,500 MG TOTAL) BY MOUTH 2 (TWO) TIMES DAILY. Glean Salvo, NP 02/23/2023 Active Family Member  memantine (NAMENDA) 10 MG tablet [id number] Yes Take 1 tablet (10 mg total) by mouth 2 (two) times daily. Marcine Matar, MD 02/23/2023 Active Family Member  pantoprazole (PROTONIX) 40 MG tablet [id number] No Take 1 tablet (40 mg total) by mouth daily.  Patient not taking: Reported on 09/08/2022   Beverley Fiedler, MD Not Taking Active Family Member  phenytoin (DILANTIN) 100 MG ER capsule [id number] Yes Take 2 capsules (200 mg total) by mouth at bedtime. FOR SEIZURES Hoy Register, MD 02/22/2023 Active Family Member  simvastatin (ZOCOR) 20 MG tablet [id number] Yes Take 1 tablet (20 mg total) by mouth at bedtime. FOR CHOLESTEROL Claiborne Rigg, NP 02/22/2023 Active Family Member  ursodiol (ACTIGALL) 250 MG tablet [id number] No TAKE 2 TABLETS BY MOUTH IN THE MORNING AND AT BEDTIME. NEEDS ENDO/COLON FOR ADDITIONAL REFILLS  Patient not taking: Reported on 02/23/2023   Beverley Fiedler, MD Not Taking Active Family Member  ursodiol (ACTIGALL) 500 MG tablet [id number] No Take 1 tablet (500 mg total) by mouth in the morning and at bedtime.  Patient not taking: Reported on 02/23/2023   Arnaldo Natal, NP Not Taking Active Family Member  Med List Note [name], CPhT 02/23/23 1806): Pt nephew [name] Febo helps with patient's medication            Home Care and Equipment/Supplies: Were Home Health Services Ordered?: NA Any new equipment or medical supplies ordered?: NA  Functional Questionnaire: Do  you have difficulty managing or taking your medications?: Yes (nephew manages meds)  Follow up appointments reviewed: PCP Follow-up appointment confirmed?: No (Attempted to assist with making follow up  appt-caregiver declined-states he is considering finding another MD) MD Provider Line Number:[phone] Given: No Specialist Hospital Follow-up appointment confirmed?: Yes Date of Specialist follow-up appointment?: 04/04/23 Follow-Up Specialty Provider:: Dr. Teresa Coombs Do you need transportation to your follow-up appointment?: No Do you understand care options if your condition(s) worsen?: Yes-patient verbalized understanding  SDOH Interventions Today    Flowsheet Row Most Recent Value  SDOH Interventions   Food Insecurity Interventions Intervention Not Indicated  Transportation Interventions Intervention Not Indicated      TOC Interventions Today    Flowsheet Row Most Recent Value  TOC Interventions   TOC Interventions Discussed/Reviewed TOC Interventions Discussed      Interventions Today    Flowsheet Row Most Recent Value  Chronic Disease   Chronic disease during today's visit Other  [seizure]  General Interventions   General Interventions Discussed/Reviewed General Interventions Discussed, Doctor Visits  Doctor Visits Discussed/Reviewed Doctor Visits Discussed, Specialist, PCP  PCP/Specialist Visits Compliance with follow-up visit  Education Interventions   Education Provided Provided Education  Provided Verbal Education On When to see the doctor, Medication  Nutrition Interventions   Nutrition Discussed/Reviewed Nutrition Discussed  Pharmacy Interventions   Pharmacy Dicussed/Reviewed Pharmacy Topics Discussed, Medications and their functions  Safety Interventions   Safety Discussed/Reviewed Safety Discussed        Alessandra Grout [facility] Care Management Care Management Community Coordinator Direct Phone: [phone] Toll Free: [phone] Fax: [phone]

## 2023-02-28 NOTE — Progress Notes (Signed)
This patient presents to the office with chief complaint of long thick painful nails.  Patient says the nails are painful walking and wearing shoes.  This patient is unable to self treat.  This patient is unable to trim hisnails since she is unable to reach his nails.  he presents to the office for preventative foot care services.  General Appearance  Alert, conversant and in no acute stress.  Vascular  Dorsalis pedis and posterior tibial  pulses are palpable  bilaterally.  Capillary return is within normal limits  bilaterally. Temperature is within normal limits  bilaterally.  Neurologic  Senn-Weinstein monofilament wire test within normal limits  bilaterally. Muscle power within normal limits bilaterally.  Nails Thick disfigured discolored nails with subungual debris  from hallux to fifth toes bilaterally. No evidence of bacterial infection or drainage bilaterally.  Orthopedic  No limitations of motion  feet .  No crepitus or effusions noted.  No bony pathology or digital deformities noted.  Skin  normotropic skin with no porokeratosis noted bilaterally.  No signs of infections or ulcers noted.     Onychomycosis  Nails  B/L.  Pain in right toes  Pain in left toes  Debridement of nails both feet followed trimming the nails with dremel tool.    RTC 3 months.   Raybon Conard DPM   

## 2023-03-11 ENCOUNTER — Other Ambulatory Visit: Payer: Self-pay | Admitting: Internal Medicine

## 2023-04-03 ENCOUNTER — Encounter (HOSPITAL_COMMUNITY): Payer: Self-pay | Admitting: Emergency Medicine

## 2023-04-03 ENCOUNTER — Other Ambulatory Visit: Payer: Self-pay | Admitting: Internal Medicine

## 2023-04-03 ENCOUNTER — Other Ambulatory Visit: Payer: Self-pay

## 2023-04-03 ENCOUNTER — Emergency Department (HOSPITAL_COMMUNITY)
Admission: EM | Admit: 2023-04-03 | Discharge: 2023-04-03 | Disposition: A | Discharge status: Home / Self Care | Payer: 59 | Attending: Emergency Medicine | Admitting: Emergency Medicine

## 2023-04-03 DIAGNOSIS — R569 Unspecified convulsions: Secondary | ICD-10-CM | POA: Insufficient documentation

## 2023-04-03 DIAGNOSIS — I10 Essential (primary) hypertension: Secondary | ICD-10-CM | POA: Insufficient documentation

## 2023-04-03 DIAGNOSIS — I69354 Hemiplegia and hemiparesis following cerebral infarction affecting left non-dominant side: Secondary | ICD-10-CM | POA: Insufficient documentation

## 2023-04-03 DIAGNOSIS — Z743 Need for continuous supervision: Secondary | ICD-10-CM | POA: Diagnosis not present

## 2023-04-03 DIAGNOSIS — F0390 Unspecified dementia without behavioral disturbance: Secondary | ICD-10-CM | POA: Diagnosis not present

## 2023-04-03 DIAGNOSIS — Z7982 Long term (current) use of aspirin: Secondary | ICD-10-CM | POA: Insufficient documentation

## 2023-04-03 LAB — COMPREHENSIVE METABOLIC PANEL
ALT: 19 U/L (ref 0–44)
AST: 16 U/L (ref 15–41)
Albumin: 3.8 g/dL (ref 3.5–5.0)
Alkaline Phosphatase: 140 U/L — ABNORMAL HIGH (ref 38–126)
Anion gap: 7 (ref 5–15)
BUN: 13 mg/dL (ref 8–23)
CO2: 28 mmol/L (ref 22–32)
Calcium: 9.1 mg/dL (ref 8.9–10.3)
Chloride: 101 mmol/L (ref 98–111)
Creatinine, Ser: 0.92 mg/dL (ref 0.61–1.24)
GFR, Estimated: >60 mL/min (ref >60)
Glucose, Bld: 104 mg/dL — ABNORMAL HIGH (ref 70–99)
Potassium: 4.9 mmol/L (ref 3.5–5.1)
Sodium: 136 mmol/L (ref 135–145)
Total Bilirubin: 0.3 mg/dL (ref 0.3–1.2)
Total Protein: 7.3 g/dL (ref 6.5–8.1)

## 2023-04-03 LAB — CBC WITH DIFFERENTIAL/PLATELET
Abs Immature Granulocytes: 0.01 10*3/uL (ref 0.00–0.07)
Basophils Absolute: 0 10*3/uL (ref 0.0–0.1)
Basophils Relative: 0 %
Eosinophils Absolute: 0.1 10*3/uL (ref 0.0–0.5)
Eosinophils Relative: 3 %
HCT: 41.7 % (ref 39.0–52.0)
Hemoglobin: 14.3 g/dL (ref 13.0–17.0)
Immature Granulocytes: 0 %
Lymphocytes Relative: 22 %
Lymphs Abs: 1 10*3/uL (ref 0.7–4.0)
MCH: 32.8 pg (ref 26.0–34.0)
MCHC: 34.3 g/dL (ref 30.0–36.0)
MCV: 95.6 fL (ref 80.0–100.0)
Monocytes Absolute: 0.5 10*3/uL (ref 0.1–1.0)
Monocytes Relative: 11 %
Neutro Abs: 3 10*3/uL (ref 1.7–7.7)
Neutrophils Relative %: 64 %
Platelets: 179 10*3/uL (ref 150–400)
RBC: 4.36 MIL/uL (ref 4.22–5.81)
RDW: 12.2 % (ref 11.5–15.5)
WBC: 4.8 10*3/uL (ref 4.0–10.5)
nRBC: 0 % (ref 0.0–0.2)

## 2023-04-03 LAB — AMMONIA: Ammonia: 19 umol/L (ref 9–35)

## 2023-04-03 LAB — PHENYTOIN LEVEL, TOTAL: Phenytoin Lvl: 15.3 ug/mL (ref 10.0–20.0)

## 2023-04-03 MED ORDER — LEVETIRACETAM IN NACL 1500 MG/100ML IV SOLN
1500.0000 mg | INTRAVENOUS | Status: AC
  Administered 2023-04-03: 1500 mg via INTRAVENOUS
  Filled 2023-04-03: qty 100

## 2023-04-03 NOTE — ED Provider Notes (Signed)
[place] EMERGENCY DEPARTMENT AT [facility] Provider Note   CSN: [id number] Arrival date & time: 04/03/23  1808     History  Chief Complaint  Patient presents with   Seizures    [name] is a 72 y.o. male.  Pt is a 72y/o male with hx of CVA with hemiparesis, seizure disorder on keppra and dilantin, HTN, barrett's esophagus, liver fibrosis, dementia who is presenting today with EMS after having 3 seizures.  Patient lives with his sister, nephew and brother-in-law and his nephew usually takes care of him throughout the day per his sister who I talked to on the phone.  He reports that patient has had 3 seizures today.  It was generalized shaking that lasted 30 seconds spread out over the day.  After the third seizure they decided to call 911 because he does not usually have seizures.  He has been compliant with his medications.  He does not use any alcohol for years.  On exam patient reports he does not know why he is here he cannot remember anything that happened but other than having some soreness in his left leg he feels fine.  He denies headache, vision changes, nausea, vomiting or chest pain.  His nephew reports over the last few days he has seemed his normal self and has had no recent illness.  Patient was last hospitalized at the beginning of May for breakthrough seizures but at that time was also found to have sepsis.  Also at that time he was found to have low seizure medication levels and was thought to be seizing due to noncompliance.  The history is provided by the patient.  Seizures      Home Medications Prior to Admission medications   Medication Sig Start Date End Date Taking? Authorizing Provider  aspirin EC 81 MG tablet Take 1 tablet (81 mg total) by mouth daily. 03/04/20   Marcine Matar, MD  folic acid (FOLVITE) 1 MG tablet Take 1 tablet (1 mg total) by mouth daily. 02/25/23   Azucena Fallen, MD  levETIRAcetam (KEPPRA) 750 MG tablet Take 2  tablets (1,500 mg total) by mouth 2 (two) times daily. 02/25/23   Azucena Fallen, MD  memantine (NAMENDA) 10 MG tablet TAKE 1 TABLET BY MOUTH TWICE A DAY 03/12/23   Marcine Matar, MD  Multiple Vitamin (MULTIVITAMIN WITH MINERALS) TABS tablet Take 1 tablet by mouth daily. 02/25/23   Azucena Fallen, MD  pantoprazole (PROTONIX) 40 MG tablet Take 1 tablet (40 mg total) by mouth daily. 02/25/23   Azucena Fallen, MD  phenytoin (DILANTIN) 100 MG ER capsule Take 2 capsules (200 mg total) by mouth at bedtime. FOR SEIZURES 02/25/23   Azucena Fallen, MD  simvastatin (ZOCOR) 20 MG tablet Take 1 tablet (20 mg total) by mouth at bedtime. FOR CHOLESTEROL 12/18/22   Claiborne Rigg, NP  thiamine (VITAMIN B-1) 100 MG tablet Take 1 tablet (100 mg total) by mouth daily. 02/25/23   Azucena Fallen, MD      Allergies    Patient has no known allergies.    Review of Systems   Review of Systems  Neurological:  Positive for seizures.    Physical Exam Updated Vital Signs BP (!) 128/90   Pulse 63   Temp 98.2 F (36.8 C) (Oral)   Resp 13   SpO2 96%  Physical Exam Vitals and nursing note reviewed.  Constitutional:      General: He is  not in acute distress.    Appearance: He is well-developed.  HENT:     Head: Normocephalic and atraumatic.  Eyes:     Conjunctiva/sclera: Conjunctivae normal.     Pupils: Pupils are equal, round, and reactive to light.  Cardiovascular:     Rate and Rhythm: Normal rate and regular rhythm.     Heart sounds: No murmur heard. Pulmonary:     Effort: Pulmonary effort is normal. No respiratory distress.     Breath sounds: Normal breath sounds. No wheezing or rales.  Abdominal:     General: There is no distension.     Palpations: Abdomen is soft.     Tenderness: There is no abdominal tenderness. There is no guarding or rebound.  Musculoskeletal:        General: No tenderness. Normal range of motion.     Cervical back: Normal range of motion and neck  supple.  Skin:    General: Skin is warm and dry.     Findings: No erythema or rash.  Neurological:     Mental Status: He is alert. Mental status is at baseline.     Comments: Oriented to person.  Weakness noted in the left upper and lower extremity 4 out of 5, left-sided facial droop.  Speech is clear  Psychiatric:        Behavior: Behavior normal.     ED Results / Procedures / Treatments   Labs (all labs ordered are listed, but only abnormal results are displayed) Labs Reviewed  COMPREHENSIVE METABOLIC PANEL - Abnormal; Notable for the following components:      Result Value   Glucose, Bld 104 (*)    Alkaline Phosphatase 140 (*)    All other components within normal limits  CBC WITH DIFFERENTIAL/PLATELET  AMMONIA  PHENYTOIN LEVEL, TOTAL  URINALYSIS, W/ REFLEX TO CULTURE (INFECTION SUSPECTED)    EKG None  Radiology No results found.  Procedures Procedures    Medications Ordered in ED Medications  levETIRAcetam (KEPPRA) IVPB 1500 mg/ 100 mL premix (0 mg Intravenous Stopped 04/03/23 2011)    ED Course/ Medical Decision Making/ A&P                             Medical Decision Making Amount and/or Complexity of Data Reviewed Independent Historian: caregiver External Data Reviewed: notes. Labs: ordered. Decision-making details documented in ED Course.  Risk Prescription drug management.   Pt with multiple medical problems and comorbidities and presenting today with a complaint that caries a high risk for morbidity and mortality.  Here today after having multiple seizures.  Seizures were spread out and 30 seconds or less per his caretaker.  He has not had any recent illnesses or cause for the seizures today.  No specific cause for why patient would have lowered seizure threshold.  They report he has been compliant with his medications but will check Dilantin levels.  Patient given a dose of Keppra here.  Will ensure no electrolyte abnormalities today or signs of  infection.  Patient is well-appearing at this time is not displaying any seizure-like activity and appears to be at his baseline.  9:51 PM I independently interpreted patient's labs today and CBC, CMP, ammonia levels and Dilantin levels all are normal and Dilantin level is in therapeutic range.  Patient has had no further seizure activity.  His nephew is present at bedside and reports he is at his baseline and he otherwise has been doing  well at home and taking his medication.  He will follow-up with his doctor tomorrow as planned.  At this time does not indicate patient needs further testing or admission.         Final Clinical Impression(s) / ED Diagnoses Final diagnoses:  Seizure [facility])    Rx / DC Orders ED Discharge Orders     None         Gwyneth Sprout, MD 04/03/23 2151

## 2023-04-03 NOTE — Discharge Instructions (Addendum)
All the blood work today looks good.  Continue current seizure medications but make sure you talk to your doctor tomorrow and let them know that you did have some breakthrough seizures today.  If you continue to have intermittent seizures at home you may need to have your medication changed.

## 2023-04-04 ENCOUNTER — Ambulatory Visit: Payer: 59 | Admitting: Neurology

## 2023-04-04 ENCOUNTER — Encounter: Payer: Self-pay | Admitting: Neurology

## 2023-04-07 ENCOUNTER — Other Ambulatory Visit: Payer: Self-pay | Admitting: Internal Medicine

## 2023-04-26 ENCOUNTER — Other Ambulatory Visit: Payer: Self-pay | Admitting: Internal Medicine

## 2023-04-26 ENCOUNTER — Other Ambulatory Visit: Payer: Self-pay | Admitting: Nurse Practitioner

## 2023-04-26 DIAGNOSIS — R252 Cramp and spasm: Secondary | ICD-10-CM

## 2023-04-26 MED ORDER — MEMANTINE HCL 10 MG PO TABS: 10.0000 mg | ORAL_TABLET | Freq: Two times a day (BID) | ORAL | 0 refills | Status: DC

## 2023-04-26 NOTE — Telephone Encounter (Signed)
Requested Prescriptions  Pending Prescriptions Disp Refills   memantine (NAMENDA) 10 MG tablet 180 tablet 0    Sig: Take 1 tablet (10 mg total) by mouth 2 (two) times daily.     Neurology:  Alzheimer's Agents 2 Passed - 04/26/2023  1:13 PM      Passed - Cr in normal range and within 360 days    Creat  Date Value Ref Range Status  04/08/2017 0.93 0.70 - 1.25 mg/dL Final    Comment:      For patients > or = 70 years of age: The upper reference limit for Creatinine is approximately 13% higher for people identified as African-American.      Creatinine, Ser  Date Value Ref Range Status  04/03/2023 0.92 0.61 - 1.24 mg/dL Final         Passed - eGFR is 5 or above and within 360 days    GFR, Est African American  Date Value Ref Range Status  04/08/2017 >89 >=60 mL/min Final   GFR calc Af Amer  Date Value Ref Range Status  07/25/2020 >60 >60 mL/min Final   GFR, Est Non African American  Date Value Ref Range Status  04/08/2017 86 >=60 mL/min Final   GFR, Estimated  Date Value Ref Range Status  04/03/2023 >60 >60 mL/min Final    Comment:    (NOTE) Calculated using the CKD-EPI Creatinine Equation (2021)    GFR  Date Value Ref Range Status  06/15/2020 94.35 >60.00 mL/min Final   eGFR  Date Value Ref Range Status  11/21/2021 94 >59 mL/min/1.73 Final         Passed - Valid encounter within last 6 months    Recent Outpatient Visits           4 months ago Leg cramps   [facility] [place], Shea Stakes, NP   12 months ago Seizure [facility])   [place] [facility] Marcine Matar, MD   1 year ago Essential hypertension   [facility] Marcine Matar, MD   1 year ago Seizure [facility])   [facility] [place], [place], [place]   1 year ago Need for immunization against influenza   [facility] [place], Cornelius Moras,  RPH-CPP       Future Appointments             In 6 days Sharon Seller, Marzella Schlein, PA-C [facility]

## 2023-04-26 NOTE — Telephone Encounter (Signed)
Medication Refill - Medication: memantine (NAMENDA) 10 MG tablet [[id number]]   Has the patient contacted their pharmacy? Yes.     (Agent: If yes, when and what did the pharmacy advise?) Contact PCP   [facility]  [address]   Has the patient been seen for an appointment in the last year OR does the patient have an upcoming appointment? Yes.    Agent: Please be advised that RX refills may take up to 3 business days. We ask that you follow-up with your pharmacy.

## 2023-05-01 NOTE — Progress Notes (Signed)
Patient ID: [name], male   DOB: 1951/09/06, 73 y.o.   MRN: [id number]     [name], is a 72 y.o. male  [name]  ION:[id number]  DOB - 11-Oct-1951  Chief Complaint  Patient presents with   Hospitalization Follow-up       Subjective:   [name] is a 72 y.o. male here today for a follow up visit After ED visit 04/03/2023 for seizure.  It appears he has not been seen by neurology since 2022.  He lives with his sister and nephew(nephew is amputee and on dialysis M,W,F).  The nephew is here today.  The patient can perform many ADL such as bathing and dressing.  He uses a cane to ambulate.  He is unable to do his meds and remember things and has to have assistance with many day to day things such as that with family.  They are interested in getting him additional care.  He denies any pain or problems.  No witnessed seizures since early June when seen at ED.  Phenytoin dose was adjusted to 200mg  daily   From ED note:  [name] is a 72 y.o. male.   Pt is a 72y/o male with hx of CVA with hemiparesis, seizure disorder on keppra and dilantin, HTN, barrett's esophagus, liver fibrosis, dementia who is presenting today with EMS after having 3 seizures.  Patient lives with his sister, nephew and brother-in-law and his nephew usually takes care of him throughout the day per his sister who I talked to on the phone.  He reports that patient has had 3 seizures today.  It was generalized shaking that lasted 30 seconds spread out over the day.  After the third seizure they decided to call 911 because he does not usually have seizures.  He has been compliant with his medications.  He does not use any alcohol for years.  On exam patient reports he does not know why he is here he cannot remember anything that happened but other than having some soreness in his left leg he feels fine.  He denies headache, vision changes, nausea, vomiting or chest pain.  His nephew reports over the last few days  he has seemed his normal self and has had no recent illness.  Patient was last hospitalized at the beginning of May for breakthrough seizures but at that time was also found to have sepsis.  Also at that time he was found to have low seizure medication levels and was thought to be seizing due to noncompliance.   Pt with multiple medical problems and comorbidities and presenting today with a complaint that caries a high risk for morbidity and mortality.  Here today after having multiple seizures.  Seizures were spread out and 30 seconds or less per his caretaker.  He has not had any recent illnesses or cause for the seizures today.  No specific cause for why patient would have lowered seizure threshold.  They report he has been compliant with his medications but will check Dilantin levels.  Patient given a dose of Keppra here.  Will ensure no electrolyte abnormalities today or signs of infection.  Patient is well-appearing at this time is not displaying any seizure-like activity and appears to be at his baseline.   9:51 PM I independently interpreted patient's labs today and CBC, CMP, ammonia levels and Dilantin levels all are normal and Dilantin level is in therapeutic range.  Patient has had no further seizure activity.  His nephew is  present at bedside and reports he is at his baseline and he otherwise has been doing well at home and taking his medication.  He will follow-up with his doctor tomorrow as planned.  At this time does not indicate patient needs further testing or admission.   No problems updated.  ALLERGIES: No Known Allergies  PAST MEDICAL HISTORY: Past Medical History:  Diagnosis Date   Alcoholism (HCC)    History of, not active   Chronic hepatitis C (HCC)    Dementia (HCC)    Diverticulosis    Gait disorder    Gingival hypertrophy    Secondary to Dilantin   Hemiparesis and alteration of sensations as late effects of stroke (HCC) 01/25/2017   Left hemiparesis   Hiatal  hernia    History of alcoholism (HCC)    Hx of ischemic vertebrobasilar artery thalamic stroke    Right   Internal hemorrhoids    Memory disorder 03/04/2013   Primary biliary cirrhosis (HCC)    Seizures (HCC)    Stroke (HCC)    Right frontal, right thalamic   Tubular adenoma of colon    Ulnar neuropathy of left upper extremity     MEDICATIONS AT HOME: Prior to Admission medications   Medication Sig Start Date End Date Taking? Authorizing Provider  levETIRAcetam (KEPPRA) 750 MG tablet Take 2 tablets (1,500 mg total) by mouth 2 (two) times daily. 02/25/23  Yes Azucena Fallen, MD  memantine (NAMENDA) 10 MG tablet Take 1 tablet (10 mg total) by mouth 2 (two) times daily. 04/26/23  Yes Marcine Matar, MD  phenytoin (DILANTIN) 100 MG ER capsule Take 2 capsules (200 mg total) by mouth at bedtime. FOR SEIZURES 02/25/23  Yes Azucena Fallen, MD  aspirin EC 81 MG tablet Take 1 tablet (81 mg total) by mouth daily. 03/04/20   Marcine Matar, MD  folic acid (FOLVITE) 1 MG tablet Take 1 tablet (1 mg total) by mouth daily. 02/25/23   Azucena Fallen, MD  Multiple Vitamin (MULTIVITAMIN WITH MINERALS) TABS tablet Take 1 tablet by mouth daily. 02/25/23   Azucena Fallen, MD  pantoprazole (PROTONIX) 40 MG tablet Take 1 tablet (40 mg total) by mouth daily. 05/02/23   Anders Simmonds, PA-C  simvastatin (ZOCOR) 20 MG tablet Take 1 tablet (20 mg total) by mouth at bedtime. FOR CHOLESTEROL 05/02/23   Anders Simmonds, PA-C  thiamine (VITAMIN B-1) 100 MG tablet Take 1 tablet (100 mg total) by mouth daily. 02/25/23   Azucena Fallen, MD    ROS: Neg HEENT Neg resp Neg cardiac Neg GI Neg GU Neg MS Neg psych Neg neuro  Objective:   Vitals:   05/02/23 1154  BP: 117/65  Pulse: 69  SpO2: 97%  Weight: 168 lb 12.8 oz (76.6 kg)   Exam General appearance : Awake, alert, not in any distress. Speech Clear. Not toxic looking;  ambulates with cane.   HEENT: Atraumatic and  Normocephalic.  He responds to commands and will answer direct commands but nephew does most of the talking. Neck: Supple, no JVD. No cervical lymphadenopathy.  Chest: Good air entry bilaterally, CTAB.  No rales/rhonchi/wheezing CVS: S1 S2 regular, no murmurs.  Extremities: B/L Lower Ext shows no edema, both legs are warm to touch Neurology: Awake alert, and oriented X 3, CN II-XII intact Skin: No Rash  Data Review Lab Results  Component Value Date   HGBA1C 5.3 05/23/2016    Assessment & Plan   1. Seizure (HCC)  Stable.  Continue keppra-RF not needed on that - Phenytoin level, free and total - Ambulatory referral to Neurology  2. Essential hypertension Currently not on meds/controlled - Comprehensive metabolic panel  3. Memory disorder Continue namenda.  RF not needed - Ambulatory referral to Neurology  4. Hemiparesis of left nondominant side as late effect of cerebral infarction [facility]) Not new - Comprehensive metabolic panel  5. Elevated alkaline phosphatase level - Comprehensive metabolic panel  6. Dyslipidemia - simvastatin (ZOCOR) 20 MG tablet; Take 1 tablet (20 mg total) by mouth at bedtime. FOR CHOLESTEROL  Dispense: 90 tablet; Refill: 1  7. Gastroesophageal reflux disease without esophagitis - pantoprazole (PROTONIX) 40 MG tablet; Take 1 tablet (40 mg total) by mouth daily.  Dispense: 30 tablet; Refill: 3   Robyne Peers met with them to discuss [facility] program and other options for assistance Return in about 2 months (around 07/03/2023) for PCP([facility]) for chronic conditions.  The patient was given clear instructions to go to ER or return to medical center if symptoms don't improve, worsen or new problems develop. The patient verbalized understanding. The patient was told to call to get lab results if they haven't heard anything in the next week.      Georgian Co, PA-C [facility] [place], [place] [phone]   05/02/2023,  1:16 PM

## 2023-05-02 ENCOUNTER — Ambulatory Visit: Payer: 59 | Attending: Physician Assistant | Admitting: Physician Assistant

## 2023-05-02 ENCOUNTER — Encounter: Payer: Self-pay | Admitting: Physician Assistant

## 2023-05-02 ENCOUNTER — Telehealth: Payer: Self-pay

## 2023-05-02 VITALS — BP 117/65 | HR 69 | Wt 168.8 lb

## 2023-05-02 DIAGNOSIS — R748 Abnormal levels of other serum enzymes: Secondary | ICD-10-CM

## 2023-05-02 DIAGNOSIS — K219 Gastro-esophageal reflux disease without esophagitis: Secondary | ICD-10-CM | POA: Diagnosis not present

## 2023-05-02 DIAGNOSIS — E785 Hyperlipidemia, unspecified: Secondary | ICD-10-CM | POA: Diagnosis not present

## 2023-05-02 DIAGNOSIS — I69354 Hemiplegia and hemiparesis following cerebral infarction affecting left non-dominant side: Secondary | ICD-10-CM | POA: Diagnosis not present

## 2023-05-02 DIAGNOSIS — Z09 Encounter for follow-up examination after completed treatment for conditions other than malignant neoplasm: Secondary | ICD-10-CM | POA: Diagnosis not present

## 2023-05-02 DIAGNOSIS — R569 Unspecified convulsions: Secondary | ICD-10-CM

## 2023-05-02 DIAGNOSIS — I10 Essential (primary) hypertension: Secondary | ICD-10-CM

## 2023-05-02 DIAGNOSIS — R413 Other amnesia: Secondary | ICD-10-CM

## 2023-05-02 MED ORDER — PANTOPRAZOLE SODIUM 40 MG PO TBEC
40.0000 mg | DELAYED_RELEASE_TABLET | Freq: Every day | ORAL | 3 refills | Status: DC
Start: 2023-05-02 — End: 2023-06-28

## 2023-05-02 MED ORDER — SIMVASTATIN 20 MG PO TABS
20.0000 mg | ORAL_TABLET | Freq: Every day | ORAL | 1 refills | Status: DC
Start: 2023-05-02 — End: 2023-08-20

## 2023-05-02 NOTE — Telephone Encounter (Signed)
At the request of Corene Cornea, PA, I met with the patient and his nephew, [name], when they were in the clinic today.    Patient's nephew was inquiring about ALFs/ memory care facilities for the patient.  He understands that it may be difficult to find a facility for memory care until after the patient  has been evaluated by neurology.  A regular ALF may not be appropriate for him at this time.  The patient is ambulatory with his cane and per [facility], is independent with her personal care. He was not interested in [facility] .  The [facility] of [facility] was discussed and [name] was very interested. He said he has visited there in the past and is going to call them again. I explained that [facility] may be a good option in the interim.  They have in-home care, day programs and they manage all of his medical needs. While he is receiving those services, they can research ALF/ Memory care options.  I told [name] to call me if he has any questions / needs additional information and he said he would.

## 2023-06-04 ENCOUNTER — Ambulatory Visit: Payer: 59 | Admitting: Podiatry

## 2023-06-24 NOTE — Progress Notes (Unsigned)
Subjective:   [name] is a 72 y.o. male who presents for Medicare Annual/Subsequent preventive examination.  Visit Complete: {VISITMETHOD:[id number]}  Patient Medicare AWV questionnaire was completed by the patient on ***; I have confirmed that all information answered by patient is correct and no changes since this date.  Vital Signs: Because this visit was a virtual/telehealth visit, some criteria may be missing or patient reported. Any vitals not documented were not able to be obtained and vitals that have been documented are patient reported.   Review of Systems    ***       Objective:    There were no vitals filed for this visit. There is no height or weight on file to calculate BMI.     02/23/2023    2:22 PM 09/08/2022   10:39 PM 07/02/2022    6:54 AM 05/25/2022    4:13 AM 03/26/2022    3:45 PM 06/29/2021    3:39 PM 07/26/2020   12:11 AM  Advanced Directives  Does Patient Have a Medical Advance Directive? No No No No No No No  Would patient like information on creating a medical advance directive? No - Guardian declined No - Patient declined No - Patient declined  No - Patient declined No - Patient declined     Current Medications (verified) Outpatient Encounter Medications as of 06/25/2023  Medication Sig   aspirin EC 81 MG tablet Take 1 tablet (81 mg total) by mouth daily.   folic acid (FOLVITE) 1 MG tablet Take 1 tablet (1 mg total) by mouth daily.   levETIRAcetam (KEPPRA) 750 MG tablet Take 2 tablets (1,500 mg total) by mouth 2 (two) times daily.   memantine (NAMENDA) 10 MG tablet Take 1 tablet (10 mg total) by mouth 2 (two) times daily.   Multiple Vitamin (MULTIVITAMIN WITH MINERALS) TABS tablet Take 1 tablet by mouth daily.   pantoprazole (PROTONIX) 40 MG tablet Take 1 tablet (40 mg total) by mouth daily.   phenytoin (DILANTIN) 100 MG ER capsule Take 2 capsules (200 mg total) by mouth at bedtime. FOR SEIZURES   simvastatin (ZOCOR) 20 MG tablet Take 1 tablet (20  mg total) by mouth at bedtime. FOR CHOLESTEROL   thiamine (VITAMIN B-1) 100 MG tablet Take 1 tablet (100 mg total) by mouth daily.   No facility-administered encounter medications on file as of 06/25/2023.    Allergies (verified) Patient has no known allergies.   History: Past Medical History:  Diagnosis Date   Alcoholism (HCC)    History of, not active   Chronic hepatitis C (HCC)    Dementia (HCC)    Diverticulosis    Gait disorder    Gingival hypertrophy    Secondary to Dilantin   Hemiparesis and alteration of sensations as late effects of stroke (HCC) 01/25/2017   Left hemiparesis   Hiatal hernia    History of alcoholism (HCC)    Hx of ischemic vertebrobasilar artery thalamic stroke    Right   Internal hemorrhoids    Memory disorder 03/04/2013   Primary biliary cirrhosis (HCC)    Seizures (HCC)    Stroke (HCC)    Right frontal, right thalamic   Tubular adenoma of colon    Ulnar neuropathy of left upper extremity    Past Surgical History:  Procedure Laterality Date   COLONOSCOPY WITH PROPOFOL N/A 07/10/2017   Procedure: COLONOSCOPY WITH PROPOFOL;  Surgeon: Kathi Der, MD;  Location: [facility] ENDOSCOPY;  Service: Gastroenterology;  Laterality: N/A;   ESOPHAGOGASTRODUODENOSCOPY (  EGD) WITH PROPOFOL N/A 07/10/2017   Procedure: ESOPHAGOGASTRODUODENOSCOPY (EGD) WITH PROPOFOL;  Surgeon: Kathi Der, MD;  Location: [facility] ENDOSCOPY;  Service: Gastroenterology;  Laterality: N/A;   HIP ARTHROPLASTY Left 11/26/2013   Procedure: LEFT HIP HEMIARTHROPLASTY;  Surgeon: Kathryne Hitch, MD;  Location: [facility] OR;  Service: Orthopedics;  Laterality: Left;   WRIST SURGERY Left 95 & 96   Family History  Problem Relation Age of Onset   Pulmonary embolism Mother    Liver disease Father    Diabetes Sister    Hypertension Sister    Hypertension Sister    Hypertension Sister    Seizures Neg Hx    Social History   Socioeconomic History   Marital status: Divorced    Spouse name: Not  on file   Number of children: 0   Years of education: 10   Highest education level: Not on file  Occupational History   Not on file  Tobacco Use   Smoking status: Former    Current packs/day: 0.00    Types: Cigarettes    Quit date: 01/02/2014    Years since quitting: 9.4   Smokeless tobacco: Former  Building services engineer status: Never Used  Substance and Sexual Activity   Alcohol use: No    Alcohol/week: 0.0 standard drinks of alcohol    Comment: Former Alcoholic   Drug use: No   Sexual activity: Yes  Other Topics Concern   Not on file  Social History Narrative   Patient is single and lives with a friend and her son.   Patient does not have any children.   Patient is disabled.   Patient has a 10 th grade education.   Patient is left-handed.   Patient drinks some soda but not everyday.   Social Determinants of Health   Financial Resource Strain: Not on file  Food Insecurity: No Food Insecurity (02/28/2023)   Hunger Vital Sign    Worried About Running Out of Food in the Last Year: Never true    Ran Out of Food in the Last Year: Never true  Transportation Needs: No Transportation Needs (02/28/2023)   PRAPARE - Administrator, Civil Service (Medical): No    Lack of Transportation (Non-Medical): No  Physical Activity: Not on file  Stress: Not on file  Social Connections: Not on file    Tobacco Counseling Counseling given: Not Answered   Clinical Intake:                        Activities of Daily Living     No data to display          Patient Care Team: Marcine Matar, MD as PCP - General (Internal Medicine)  Indicate any recent Medical Services you may have received from other than [facility] providers in the past year (date may be approximate).     Assessment:   This is a routine wellness examination for [place].  Hearing/Vision screen No results found.  Dietary issues and exercise activities discussed:     Goals Addressed    None    Depression Screen    04/30/2022    9:55 AM 02/19/2022    2:53 PM 06/29/2021    3:40 PM 04/03/2021   10:23 AM 07/07/2020   10:44 AM 02/04/2019   10:43 AM 12/30/2018    2:29 PM  PHQ 2/9 Scores  PHQ - 2 Score 0 0 0 0 0 0 0  PHQ- 9  Score 0 0    1     Fall Risk    05/02/2023   11:57 AM 12/18/2022    1:49 PM 04/30/2022    9:55 AM 06/29/2021    3:39 PM 04/03/2021   10:23 AM  Fall Risk   Falls in the past year? 0 0 0 0 0  Number falls in past yr: 0 0 0 0 0  Injury with Fall? 0 0 0 0 0  Risk for fall due to : No Fall Risks No Fall Risks No Fall Risks Impaired balance/gait;Impaired mobility;Impaired vision;Mental status change No Fall Risks  Follow up  Falls evaluation completed Falls evaluation completed Falls evaluation completed;Education provided;Falls prevention discussed     MEDICARE RISK AT HOME:    TIMED UP AND GO:  Was the test performed?  No    Cognitive Function:    11/21/2021   10:02 AM 06/29/2021    3:41 PM 08/18/2019    2:32 PM 01/27/2018    2:36 PM 09/28/2015    2:37 PM  MMSE - Mini Mental State Exam  Not completed: Unable to complete      Orientation to time  2 0 0 0  Orientation to Place  2 3 2 3   Registration  2 3 3 3   Attention/ Calculation  3 0 0 0  Recall  0 0 0 0  Language- name 2 objects  2 2 2 2   Language- repeat  1 0 1 1  Language- follow 3 step command  3 2 3 3   Language- read & follow direction  0 0 0 0  Write a sentence  0 0 0 0  Copy design  0 0 0 0  Total score  15 10 11 12         Immunizations Immunization History  Administered Date(s) Administered   Influenza,inj,Quad PF,6+ Mos 11/28/2013, 09/02/2017, 07/10/2018, 07/02/2019, 07/07/2020, 06/29/2021   Moderna SARS-COV2 Booster Vaccination 09/13/2020   Moderna Sars-Covid-2 Vaccination 12/14/2019, 01/12/2020   Pneumococcal Conjugate-13 07/10/2018   Pneumococcal Polysaccharide-23 09/22/2015, 04/03/2021   Tdap 05/23/2016    TDAP status: Up to date  Flu Vaccine status: Due, Education  has been provided regarding the importance of this vaccine. Advised may receive this vaccine at local pharmacy or Health Dept. Aware to provide a copy of the vaccination record if obtained from local pharmacy or Health Dept. Verbalized acceptance and understanding.  Pneumococcal vaccine status: Up to date  Covid-19 vaccine status: Information provided on how to obtain vaccines.   Qualifies for Shingles Vaccine? Yes   Zostavax completed No   Shingrix Completed?: No.    Education has been provided regarding the importance of this vaccine. Patient has been advised to call insurance company to determine out of pocket expense if they have not yet received this vaccine. Advised may also receive vaccine at local pharmacy or Health Dept. Verbalized acceptance and understanding.  Screening Tests Health Maintenance  Topic Date Due   Zoster Vaccines- Shingrix (1 of 2) Never done   COVID-19 Vaccine (3 - Moderna risk series) 10/11/2020   Medicare Annual Wellness (AWV)  06/29/2022   INFLUENZA VACCINE  05/23/2023   Colonoscopy  12/18/2024   DTaP/Tdap/Td (2 - Td or Tdap) 05/23/2026   Pneumonia Vaccine 35+ Years old  Completed   Hepatitis C Screening  Completed   HPV VACCINES  Aged Out    Health Maintenance  Health Maintenance Due  Topic Date Due   Zoster Vaccines- Shingrix (1 of 2) Never done  COVID-19 Vaccine (3 - Moderna risk series) 10/11/2020   Medicare Annual Wellness (AWV)  06/29/2022   INFLUENZA VACCINE  05/23/2023    Colorectal cancer screening: Type of screening: Colonoscopy. Completed 12/18/21. Repeat every 3 years  Lung Cancer Screening: (Low Dose CT Chest recommended if Age 47-80 years, 20 pack-year currently smoking OR have quit w/in 15years.) does not qualify.   Lung Cancer Screening Referral: n/a  Additional Screening:  Hepatitis C Screening: does qualify; Completed 12/22/19  Vision Screening: Recommended annual ophthalmology exams for early detection of glaucoma and other  disorders of the eye. Is the patient up to date with their annual eye exam?  {YES/NO:21197} Who is the provider or what is the name of the office in which the patient attends annual eye exams? *** If pt is not established with a provider, would they like to be referred to a provider to establish care? {YES/NO:21197}.   Dental Screening: Recommended annual dental exams for proper oral hygiene  Community Resource Referral / Chronic Care Management: CRR required this visit?  {YES/NO:21197}  CCM required this visit?  {CCM Required [phone]}     Plan:     I have personally reviewed and noted the following in the patient's chart:   Medical and social history Use of alcohol, tobacco or illicit drugs  Current medications and supplements including opioid prescriptions. {Opioid [phone]} Functional ability and status Nutritional status Physical activity Advanced directives List of other physicians Hospitalizations, surgeries, and ER visits in previous 12 months Vitals Screenings to include cognitive, depression, and falls Referrals and appointments  In addition, I have reviewed and discussed with patient certain preventive protocols, quality metrics, and best practice recommendations. A written personalized care plan for preventive services as well as general preventive health recommendations were provided to patient.     Kandis Fantasia [place], [place]   12/28/7562   After Visit Summary: {CHL AMB AWV After Visit [phone]}  Nurse Notes: ***

## 2023-06-24 NOTE — Patient Instructions (Incomplete)
[name] , Thank you for taking time to come for your Medicare Wellness Visit. I appreciate your ongoing commitment to your health goals. Please review the following plan we discussed and let me know if I can assist you in the future.   Referrals/Orders/Follow-Ups/Clinician Recommendations: Aim for 30 minutes of exercise or brisk walking, 6-8 glasses of water, and 5 servings of fruits and vegetables each day.  This is a list of the screening recommended for you and due dates:  Health Maintenance  Topic Date Due   Zoster (Shingles) Vaccine (1 of 2) Never done   COVID-19 Vaccine (3 - Moderna risk series) 10/11/2020   Medicare Annual Wellness Visit  06/29/2022   Flu Shot  05/23/2023   Colon Cancer Screening  12/18/2024   DTaP/Tdap/Td vaccine (2 - Td or Tdap) 05/23/2026   Pneumonia Vaccine  Completed   Hepatitis C Screening  Completed   HPV Vaccine  Aged Out    Advanced directives: (ACP Link)Information on Advanced Care Planning can be found at [facility] [place] Advance Health Care Directives Advance Health Care Directives ([email or web address]   Next Medicare Annual Wellness Visit scheduled for next year: Yes

## 2023-06-25 ENCOUNTER — Ambulatory Visit: Payer: 59 | Attending: Internal Medicine

## 2023-06-25 VITALS — Ht 67.0 in | Wt 168.0 lb

## 2023-06-25 DIAGNOSIS — Z0000 Encounter for general adult medical examination without abnormal findings: Secondary | ICD-10-CM | POA: Diagnosis not present

## 2023-06-27 ENCOUNTER — Other Ambulatory Visit: Payer: Self-pay | Admitting: Physician Assistant

## 2023-06-27 ENCOUNTER — Other Ambulatory Visit: Payer: Self-pay | Admitting: Internal Medicine

## 2023-06-27 ENCOUNTER — Other Ambulatory Visit: Payer: Self-pay | Admitting: Nurse Practitioner

## 2023-06-27 DIAGNOSIS — R252 Cramp and spasm: Secondary | ICD-10-CM

## 2023-06-27 DIAGNOSIS — K219 Gastro-esophageal reflux disease without esophagitis: Secondary | ICD-10-CM

## 2023-07-17 ENCOUNTER — Ambulatory Visit: Payer: Self-pay | Admitting: *Deleted

## 2023-07-17 NOTE — Telephone Encounter (Signed)
Routing to PCP for review.

## 2023-07-17 NOTE — Telephone Encounter (Signed)
Reason for Disposition  Second seizure occurs on the same day  Answer Assessment - Initial Assessment Questions 1. ONSET: "When did the seizure occur?"     "Couple seizures today" 2. DURATION: "How long did the seizure last (or how long has it been happening)?" (e.g., seconds, minutes)  Note: Most seizures last less than 5 minutes.     Minutes-short 3. DESCRIPTION: "Describe what happened during the seizure." "Did the body become stiff?" "Was there any jerking?"  "Did they lose consciousness during the seizure?"     Patient calls sister's name, unknown 4. CIRCUMSTANCE: "What was the person doing when the seizure began?"      Thinks patient was sleeping 5. MENTAL STATUS AFTER SEIZURE: "Does the person seem more groggy or sleepy?" "Does the person know who they are, who you are, and where they are now?"      Confused- knows family members 6. PRIOR SEIZURES: "Has the person had a seizure (convulsion) before?" (e.g., epilepsy, other cause)  If Yes, ask: "When was the last time?" and "What happened last time?"      Hx of seizures 7. EPILEPSY: "Does the person have epilepsy?" Note: Check for medical ID bracelet.     yes 8. MEDICINES: "Does the person take anticonvulsant medications?" (e.g., Yes, No; missed doses, any recent changes)     Yes- capsules at bedtime- but stopped chewables- thinks patient is worse without chewable medication 9. INJURY: "Was the person hurt or injured during the seizure?" (e.g., hit their head, bit their tongue)     no 10. OTHER SYMPTOMS: "Are there any other symptoms?" (e.g., fever, headache)       No illness  Protocols used: [employer]

## 2023-07-17 NOTE — Telephone Encounter (Signed)
  Chief Complaint: at least 2 seizures this morning Symptoms: seizure activity today- at least twice with patient wife present- nephew was not at the home-but is calling to report Frequency: hx seizures Pertinent Negatives: Patient denies other symptoms- illness-fever,etc Disposition: [x] ED /[] Urgent Care (no appt availability in office) / [] Appointment(In office/virtual)/ []  [facility]/ [] Home Care/ [] Refused Recommended Disposition /[] [facility]/ []  Follow-up with PCP Additional Notes: Advised ED per protocol- they agree to take patient today if this happens again. Nephew is concerned that patient's recent change in medication has caused patient to have symptoms- he states patient had been on chewable medication for seizure that was discontinued.(He does not know the name of the medication) Advised ED again- patient medication levels may not be enough- but  request to send message to provider to restart chewable medication was requested.  Only chewable in chart: PHENYTOIN INFATABS 50 MG tablet

## 2023-07-18 NOTE — Telephone Encounter (Signed)
If he has a seizure he needs to go to the ED.

## 2023-07-19 NOTE — Telephone Encounter (Signed)
Pt is requesting chewable form of medication

## 2023-07-22 ENCOUNTER — Encounter: Payer: Self-pay | Admitting: Podiatry

## 2023-07-22 ENCOUNTER — Ambulatory Visit (INDEPENDENT_AMBULATORY_CARE_PROVIDER_SITE_OTHER): Payer: Medicare HMO | Admitting: Podiatry

## 2023-07-22 DIAGNOSIS — M79675 Pain in left toe(s): Secondary | ICD-10-CM | POA: Diagnosis not present

## 2023-07-22 DIAGNOSIS — B351 Tinea unguium: Secondary | ICD-10-CM

## 2023-07-22 DIAGNOSIS — M79674 Pain in right toe(s): Secondary | ICD-10-CM

## 2023-07-22 NOTE — Progress Notes (Signed)
This patient presents to the office with chief complaint of long thick painful nails.  Patient says the nails are painful walking and wearing shoes.  This patient is unable to self treat.  This patient is unable to trim hisnails since she is unable to reach his nails.  he presents to the office for preventative foot care services.  General Appearance  Alert, conversant and in no acute stress.  Vascular  Dorsalis pedis and posterior tibial  pulses are palpable  bilaterally.  Capillary return is within normal limits  bilaterally. Temperature is within normal limits  bilaterally.  Neurologic  Senn-Weinstein monofilament wire test within normal limits  bilaterally. Muscle power within normal limits bilaterally.  Nails Thick disfigured discolored nails with subungual debris  from hallux to fifth toes bilaterally. No evidence of bacterial infection or drainage bilaterally.  Orthopedic  No limitations of motion  feet .  No crepitus or effusions noted.  No bony pathology or digital deformities noted.  Skin  normotropic skin with no porokeratosis noted bilaterally.  No signs of infections or ulcers noted.     Onychomycosis  Nails  B/L.  Pain in right toes  Pain in left toes  Debridement of nails both feet followed trimming the nails with dremel tool.    RTC 3 months.   Gardiner Barefoot DPM

## 2023-07-24 ENCOUNTER — Other Ambulatory Visit: Payer: Self-pay | Admitting: Internal Medicine

## 2023-07-24 DIAGNOSIS — R569 Unspecified convulsions: Secondary | ICD-10-CM

## 2023-07-24 MED ORDER — PHENYTOIN SODIUM EXTENDED 100 MG PO CAPS
200.0000 mg | ORAL_CAPSULE | Freq: Every day | ORAL | 1 refills | Status: DC
Start: 2023-07-24 — End: 2023-11-26

## 2023-07-24 MED ORDER — PHENYTOIN INFATABS 50 MG PO CHEW: 25.0000 mg | CHEWABLE_TABLET | Freq: Every day | ORAL | 1 refills | Status: DC

## 2023-07-24 NOTE — Telephone Encounter (Signed)
Called & spoke to the patient's nephew / caregiver. Informed him that a RF was sent on phenytoin that he was previously on which is the extended release tablet 100 mg to take 2 at bed time + 1/2 of the 50 mg chewable tablet. Follow-up appointment with PCP has been scheduled for 08/20/2023. Nephew confirmed appointment.  Informed him that a referral to neurology was submitted. Nephew expressed verbal understanding of all discussed.

## 2023-07-24 NOTE — Telephone Encounter (Signed)
Let pt's nephew/care giver know that I sent RF on phenytoin that he was previously on which is the extended release tablet 100 mg to take 2 at bed time + 1/2 of the 50 mg chewable tablet. Please give a f/u appt with me within the next mth so we can check his levels. I also submitted a referral for him to f/u with neurology.

## 2023-08-20 ENCOUNTER — Encounter: Payer: Self-pay | Admitting: Internal Medicine

## 2023-08-20 ENCOUNTER — Ambulatory Visit: Payer: Medicare HMO | Attending: Internal Medicine | Admitting: Internal Medicine

## 2023-08-20 VITALS — BP 120/65 | HR 89 | Temp 98.3°F | Ht 67.0 in | Wt 165.0 lb

## 2023-08-20 DIAGNOSIS — I69398 Other sequelae of cerebral infarction: Secondary | ICD-10-CM

## 2023-08-20 DIAGNOSIS — F0150 Vascular dementia without behavioral disturbance: Secondary | ICD-10-CM

## 2023-08-20 DIAGNOSIS — K743 Primary biliary cirrhosis: Secondary | ICD-10-CM

## 2023-08-20 DIAGNOSIS — R569 Unspecified convulsions: Secondary | ICD-10-CM

## 2023-08-20 DIAGNOSIS — Z23 Encounter for immunization: Secondary | ICD-10-CM

## 2023-08-20 DIAGNOSIS — R252 Cramp and spasm: Secondary | ICD-10-CM

## 2023-08-20 DIAGNOSIS — I69359 Hemiplegia and hemiparesis following cerebral infarction affecting unspecified side: Secondary | ICD-10-CM

## 2023-08-20 DIAGNOSIS — F01518 Vascular dementia, unspecified severity, with other behavioral disturbance: Secondary | ICD-10-CM | POA: Insufficient documentation

## 2023-08-20 DIAGNOSIS — Z2911 Encounter for prophylactic immunotherapy for respiratory syncytial virus (RSV): Secondary | ICD-10-CM

## 2023-08-20 MED ORDER — RSVPREF3 VAC RECOMB ADJUVANTED 120 MCG/0.5ML IM SUSR
0.5000 mL | Freq: Once | INTRAMUSCULAR | 0 refills | Status: AC
Start: 2023-08-20 — End: 2023-08-20

## 2023-08-20 MED ORDER — ROSUVASTATIN CALCIUM 10 MG PO TABS
10.0000 mg | ORAL_TABLET | Freq: Every day | ORAL | 3 refills | Status: DC
Start: 2023-08-20 — End: 2023-11-26

## 2023-08-20 MED ORDER — ZOSTER VAC RECOMB ADJUVANTED 50 MCG/0.5ML IM SUSR
0.5000 mL | Freq: Once | INTRAMUSCULAR | 0 refills | Status: AC
Start: 2023-08-20 — End: 2023-08-20

## 2023-08-20 NOTE — Progress Notes (Signed)
Patient ID: [name], male    DOB: 03-Feb-1951  MRN: [id number]  CC: Follow-up (Follow-up./Reports that Flexeril is not helping cramps/Yes to flu & Shingles vax)   Subjective: [name] is a 72 y.o. male who presents for chronic ds management. His concerns today include:  Pt with hx of hepatitis C treated, sz disorder, CVA with residual left-sided weakness uses a rolling walker, HTN, memory deficit (followed by neurology), vascular dementia with beh disturbance, tubular adenomas, PBC   Nephew, [name], is with him and gives most of the hx.  He sets up pt's meds.  Taking Phenytoin 100 mg 2 tabs ER plus 50 mg 1/2 tab (chewable) at bedtime and Keprra 1500 mg BID - nephew reports last sz was couple mths ago HTN:  was on Metoprolol but has been off it since hosp dischg.  Not using salt in foods.   Hx of CVA/dementia:  no wondering or behavioral issues.  Sleeping ok.  Still on ASA and Zocor. -Not able to hold urine.  Wears Depends.  Urinates a lot.  No incontinent of stools. Ambulates with cane, no falls.   Gets cramps in legs during the day when sitting down  PBC:  was on your ursodiol at one point but I do not see it any longer on his med list.  He has not seen gastroenterology in over a year. Patient Active Problem List   Diagnosis Date Noted   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   Vascular dementia with other behavioral disturbance, unspecified dementia severity (HCC) 08/20/2023   Pain due to onychomycosis of toenails of both feet 02/28/2023   Sepsis (HCC) 02/23/2023   Barrett's esophagus 12/25/2021   Bell's palsy 08/23/2020   Elevated alkaline phosphatase level 11/17/2019   Adenomatous polyp of colon 09/02/2017   Functional urinary incontinence 09/02/2017   Hemiparesis and alteration of sensations as late effects of stroke (HCC) 01/25/2017   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/23/2015   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   Hip fracture (HCC) 11/26/2013   break through seizure  11/26/2013   Memory disorder 03/04/2013   Depressive disorder, not elsewhere classified 07/24/2012   Abnormality of gait 07/24/2012   Focal epilepsy with impairment of consciousness (HCC) 07/24/2012   Cerebral thrombosis with cerebral infarction (HCC) 07/24/2012   Lesion of ulnar nerve 07/24/2012     Current Outpatient Medications on File Prior to Visit  Medication Sig Dispense Refill   aspirin EC 81 MG tablet Take 1 tablet (81 mg total) by mouth daily. 100 tablet 0   folic acid (FOLVITE) 1 MG tablet Take 1 tablet (1 mg total) by mouth daily. 30 tablet 0   levETIRAcetam (KEPPRA) 750 MG tablet Take 2 tablets (1,500 mg total) by mouth 2 (two) times daily. 360 tablet 1   memantine (NAMENDA) 10 MG tablet TAKE 1 TABLET BY MOUTH TWICE A DAY 180 tablet 0   Multiple Vitamin (MULTIVITAMIN WITH MINERALS) TABS tablet Take 1 tablet by mouth daily. 30 tablet 0   pantoprazole (PROTONIX) 40 MG tablet TAKE 1 TABLET BY MOUTH EVERY DAY 90 tablet 0   phenytoin (DILANTIN) 100 MG ER capsule Take 2 capsules (200 mg total) by mouth at bedtime. FOR SEIZURES 180 capsule 1   PHENYTOIN INFATABS 50 MG tablet Chew 0.5 tablets (25 mg total) by mouth at bedtime. 45 tablet 1   thiamine (VITAMIN B-1) 100 MG tablet Take 1 tablet (100 mg total) by mouth daily. 30 tablet 0   No current facility-administered medications on  file prior to visit.    No Known Allergies  Social History   Socioeconomic History   Marital status: Divorced    Spouse name: Not on file   Number of children: 0   Years of education: 10   Highest education level: Not on file  Occupational History   Not on file  Tobacco Use   Smoking status: Former    Current packs/day: 0.00    Types: Cigarettes    Quit date: 01/02/2014    Years since quitting: 9.6   Smokeless tobacco: Former  Building services engineer status: Never Used  Substance and Sexual Activity   Alcohol use: No    Alcohol/week: 0.0 standard drinks of alcohol    Comment: Former  Alcoholic   Drug use: No   Sexual activity: Yes  Other Topics Concern   Not on file  Social History Narrative   Patient is single and lives with a friend and her son.   Patient does not have any children.   Patient is disabled.   Patient has a 10 th grade education.   Patient is left-handed.   Patient drinks some soda but not everyday.   Social Determinants of Health   Financial Resource Strain: Low Risk  (06/25/2023)   Overall Financial Resource Strain (CARDIA)    Difficulty of Paying Living Expenses: Not hard at all  Food Insecurity: No Food Insecurity (06/25/2023)   Hunger Vital Sign    Worried About Running Out of Food in the Last Year: Never true    Ran Out of Food in the Last Year: Never true  Transportation Needs: No Transportation Needs (06/25/2023)   PRAPARE - Administrator, Civil Service (Medical): No    Lack of Transportation (Non-Medical): No  Physical Activity: Inactive (06/25/2023)   Exercise Vital Sign    Days of Exercise per Week: 0 days    Minutes of Exercise per Session: 0 min  Stress: No Stress Concern Present (06/25/2023)   [employer] of Occupational Health - Occupational Stress Questionnaire    Feeling of Stress : Not at all  Social Connections: Socially Isolated (06/25/2023)   Social Connection and Isolation Panel [NHANES]    Frequency of Communication with Friends and Family: More than three times a week    Frequency of Social Gatherings with Friends and Family: Three times a week    Attends Religious Services: Never    Active Member of Clubs or Organizations: No    Attends Banker Meetings: Never    Marital Status: Divorced  Catering manager Violence: Not At Risk (06/25/2023)   Humiliation, Afraid, Rape, and Kick questionnaire    Fear of Current or Ex-Partner: No    Emotionally Abused: No    Physically Abused: No    Sexually Abused: No    Family History  Problem Relation Age of Onset   Pulmonary embolism Mother    Liver  disease Father    Diabetes Sister    Hypertension Sister    Hypertension Sister    Hypertension Sister    Seizures Neg Hx     Past Surgical History:  Procedure Laterality Date   COLONOSCOPY WITH PROPOFOL N/A 07/10/2017   Procedure: COLONOSCOPY WITH PROPOFOL;  Surgeon: Kathi Der, MD;  Location: [facility];  Service: Gastroenterology;  Laterality: N/A;   ESOPHAGOGASTRODUODENOSCOPY (EGD) WITH PROPOFOL N/A 07/10/2017   Procedure: ESOPHAGOGASTRODUODENOSCOPY (EGD) WITH PROPOFOL;  Surgeon: Kathi Der, MD;  Location: [facility];  Service: Gastroenterology;  Laterality: N/A;  HIP ARTHROPLASTY Left 11/26/2013   Procedure: LEFT HIP HEMIARTHROPLASTY;  Surgeon: Kathryne Hitch, MD;  Location: [facility] OR;  Service: Orthopedics;  Laterality: Left;   WRIST SURGERY Left 95 & 96    ROS: Review of Systems Negative except as stated above  PHYSICAL EXAM: BP 120/65   Pulse 89   Temp 98.3 F (36.8 C) (Oral)   Ht 5\' 7"  (1.702 m)   Wt 165 lb (74.8 kg)   SpO2 94%   BMI 25.84 kg/m   Physical Exam   General appearance -pleasant elderly African-American male in NAD.  Clothing is clean.  He appears well-kept. Mental status -patient knows the day of the week but does not know the month or the year. Neck - supple, no significant adenopathy Chest - clear to auscultation, no wheezes, rales or rhonchi, symmetric air entry Heart - normal rate, regular rhythm, normal S1, S2, no murmurs, rubs, clicks or gallops Neurological -patient's gait is slow and wide-based with low foot floor clearance.  He ambulates with a quad cane Extremities -no lower extremity edema.     Latest Ref Rng & Units 04/03/2023    7:41 PM 02/25/2023    5:19 AM 02/24/2023    5:41 AM  CMP  Glucose 70 - 99 mg/dL 409  811  93   BUN 8 - 23 mg/dL 13  10  16    Creatinine 0.61 - 1.24 mg/dL 9.14  7.82  9.56   Sodium 135 - 145 mmol/L 136  135  136   Potassium 3.5 - 5.1 mmol/L 4.9  3.3  3.8   Chloride 98 - 111 mmol/L 101  103   105   CO2 22 - 32 mmol/L 28  24  24    Calcium 8.9 - 10.3 mg/dL 9.1  7.9  7.8   Total Protein 6.5 - 8.1 g/dL 7.3     Total Bilirubin 0.3 - 1.2 mg/dL 0.3     Alkaline Phos 38 - 126 U/L 140     AST 15 - 41 U/L 16     ALT 0 - 44 U/L 19      Lipid Panel     Component Value Date/Time   CHOL 144 04/03/2021 1118   TRIG 85 04/03/2021 1118   HDL 35 (L) 04/03/2021 1118   CHOLHDL 4.1 04/03/2021 1118   CHOLHDL 2.5 05/29/2016 0943   VLDL 16 05/29/2016 0943   LDLCALC 93 04/03/2021 1118    CBC    Component Value Date/Time   WBC 4.8 04/03/2023 1941   RBC 4.36 04/03/2023 1941   HGB 14.3 04/03/2023 1941   HGB 14.2 10/26/2021 1001   HCT 41.7 04/03/2023 1941   HCT 41.7 10/26/2021 1001   PLT 179 04/03/2023 1941   PLT 223 10/26/2021 1001   MCV 95.6 04/03/2023 1941   MCV 95 10/26/2021 1001   MCH 32.8 04/03/2023 1941   MCHC 34.3 04/03/2023 1941   RDW 12.2 04/03/2023 1941   RDW 11.9 10/26/2021 1001   LYMPHSABS 1.0 04/03/2023 1941   LYMPHSABS 1.2 10/26/2021 1001   MONOABS 0.5 04/03/2023 1941   EOSABS 0.1 04/03/2023 1941   EOSABS 0.1 10/26/2021 1001   BASOSABS 0.0 04/03/2023 1941   BASOSABS 0.0 10/26/2021 1001    ASSESSMENT AND PLAN: 1. Seizure (HCC) Stable on current dose of phenytoin 225 mg daily and Keppra 1500 mg twice a day. - Comprehensive metabolic panel  2. Vascular dementia without behavioral disturbance, psychotic disturbance, mood disturbance, or anxiety, unspecified dementia severity (HCC) Stable.  He is in the care of his nephew and sister.  Continue Namenda  3. Hemiparesis and alteration of sensations as late effects of stroke (HCC) Continue aspirin.  We will change Zocor to Crestor for reasons discussed below in #5 - rosuvastatin (CRESTOR) 10 MG tablet; Take 1 tablet (10 mg total) by mouth daily. STOP Simvastatin  Dispense: 90 tablet; Refill: 3 - Lipid panel  4. Primary biliary cirrhosis (HCC) Will check LFTs today and get him back in with gastroenterology. -  Ambulatory referral to Gastroenterology  5. Muscle cramps May be due to Zocor.  We will switch to Crestor instead to see if he tolerates it better. - CK  6. Encounter for immunization Given today. - Flu Vaccine Trivalent High Dose (Fluad)  7. Need for shingles vaccine Advised and encouraged him to get Shingrix vaccine.  His nephew will take him to [facility] to get it sometime in the upcoming week - Zoster Vaccine Adjuvanted [facility]) injection; Inject 0.5 mLs into the muscle once for 1 dose.  Dispense: 0.5 mL; Refill: 0  8. Need for prophylactic vaccination and inoculation against respiratory syncytial virus (RSV) I recommend Arexvy. - RSV vaccine recomb adjuvanted (AREXVY) 120 MCG/0.5ML injection; Inject 0.5 mLs into the muscle once for 1 dose.  Dispense: 0.5 mL; Refill: 0    Patient was given the opportunity to ask questions.  Patient verbalized understanding of the plan and was able to repeat key elements of the plan.   This documentation was completed using Paediatric nurse.  Any transcriptional errors are unintentional.  Orders Placed This Encounter  Procedures   Flu Vaccine Trivalent High Dose (Fluad)   Comprehensive metabolic panel   Lipid panel   CK   Ambulatory referral to Gastroenterology     Requested Prescriptions   Signed Prescriptions Disp Refills   rosuvastatin (CRESTOR) 10 MG tablet 90 tablet 3    Sig: Take 1 tablet (10 mg total) by mouth daily. STOP Simvastatin   RSV vaccine recomb adjuvanted (AREXVY) 120 MCG/0.5ML injection 0.5 mL 0    Sig: Inject 0.5 mLs into the muscle once for 1 dose.   Zoster Vaccine Adjuvanted [facility]) injection 0.5 mL 0    Sig: Inject 0.5 mLs into the muscle once for 1 dose.    Return in about 4 months (around 12/20/2023).  Jonah Blue, MD, FACP

## 2023-08-20 NOTE — Patient Instructions (Signed)
Stop simvastatin.  We have started you on a different cholesterol medication called rosuvastatin instead.  I have printed prescriptions for the vaccine Arexvy for RSV and Shingrix which is a shingles vaccine.  You can get these at any outside pharmacy.

## 2023-08-21 LAB — COMPREHENSIVE METABOLIC PANEL
ALT: 23 [IU]/L (ref 0–44)
AST: 18 [IU]/L (ref 0–40)
Albumin: 4.4 g/dL (ref 3.9–4.9)
Alkaline Phosphatase: 208 [IU]/L — ABNORMAL HIGH (ref 44–121)
BUN/Creatinine Ratio: 13 (ref 10–24)
BUN: 14 mg/dL (ref 8–27)
Bilirubin Total: 0.2 mg/dL (ref 0.0–1.2)
CO2: 25 mmol/L (ref 20–29)
Calcium: 9.1 mg/dL (ref 8.6–10.2)
Chloride: 103 mmol/L (ref 96–106)
Creatinine, Ser: 1.07 mg/dL (ref 0.76–1.27)
Globulin, Total: 3 g/dL (ref 1.5–4.5)
Glucose: 104 mg/dL — ABNORMAL HIGH (ref 70–99)
Potassium: 4.7 mmol/L (ref 3.5–5.2)
Sodium: 143 mmol/L (ref 134–144)
Total Protein: 7.4 g/dL (ref 6.0–8.5)
eGFR: 75 mL/min/{1.73_m2} (ref >59)

## 2023-08-21 LAB — LIPID PANEL
Chol/HDL Ratio: 3.8 ratio (ref 0.0–5.0)
Cholesterol, Total: 122 mg/dL (ref 100–199)
HDL: 32 mg/dL — ABNORMAL LOW (ref >39)
LDL Chol Calc (NIH): 58 mg/dL (ref 0–99)
Triglycerides: 196 mg/dL — ABNORMAL HIGH (ref 0–149)
VLDL Cholesterol Cal: 32 mg/dL (ref 5–40)

## 2023-08-21 LAB — CK: Total CK: 58 U/L (ref 41–331)

## 2023-09-01 ENCOUNTER — Other Ambulatory Visit: Payer: Self-pay

## 2023-09-01 ENCOUNTER — Emergency Department (HOSPITAL_COMMUNITY)
Admission: EM | Admit: 2023-09-01 | Discharge: 2023-09-01 | Disposition: A | Discharge status: Home / Self Care | Payer: Medicare HMO | Attending: Emergency Medicine | Admitting: Emergency Medicine

## 2023-09-01 ENCOUNTER — Emergency Department (HOSPITAL_COMMUNITY): Payer: Medicare HMO

## 2023-09-01 ENCOUNTER — Encounter (HOSPITAL_COMMUNITY): Payer: Self-pay

## 2023-09-01 DIAGNOSIS — M25552 Pain in left hip: Secondary | ICD-10-CM | POA: Diagnosis not present

## 2023-09-01 DIAGNOSIS — Z7982 Long term (current) use of aspirin: Secondary | ICD-10-CM | POA: Diagnosis not present

## 2023-09-01 DIAGNOSIS — Z471 Aftercare following joint replacement surgery: Secondary | ICD-10-CM | POA: Diagnosis not present

## 2023-09-01 DIAGNOSIS — M8580 Other specified disorders of bone density and structure, unspecified site: Secondary | ICD-10-CM | POA: Diagnosis not present

## 2023-09-01 DIAGNOSIS — R569 Unspecified convulsions: Secondary | ICD-10-CM | POA: Insufficient documentation

## 2023-09-01 DIAGNOSIS — Z96642 Presence of left artificial hip joint: Secondary | ICD-10-CM | POA: Diagnosis not present

## 2023-09-01 LAB — CBC WITH DIFFERENTIAL/PLATELET
Abs Immature Granulocytes: 0.02 10*3/uL (ref 0.00–0.07)
Basophils Absolute: 0 10*3/uL (ref 0.0–0.1)
Basophils Relative: 0 %
Eosinophils Absolute: 0 10*3/uL (ref 0.0–0.5)
Eosinophils Relative: 1 %
HCT: 42.5 % (ref 39.0–52.0)
Hemoglobin: 15 g/dL (ref 13.0–17.0)
Immature Granulocytes: 0 %
Lymphocytes Relative: 16 %
Lymphs Abs: 0.9 10*3/uL (ref 0.7–4.0)
MCH: 33.3 pg (ref 26.0–34.0)
MCHC: 35.3 g/dL (ref 30.0–36.0)
MCV: 94.4 fL (ref 80.0–100.0)
Monocytes Absolute: 0.4 10*3/uL (ref 0.1–1.0)
Monocytes Relative: 8 %
Neutro Abs: 4.2 10*3/uL (ref 1.7–7.7)
Neutrophils Relative %: 75 %
Platelets: 173 10*3/uL (ref 150–400)
RBC: 4.5 MIL/uL (ref 4.22–5.81)
RDW: 11.9 % (ref 11.5–15.5)
WBC: 5.7 10*3/uL (ref 4.0–10.5)
nRBC: 0 % (ref 0.0–0.2)

## 2023-09-01 LAB — COMPREHENSIVE METABOLIC PANEL
ALT: 21 U/L (ref 0–44)
AST: 18 U/L (ref 15–41)
Albumin: 4.4 g/dL (ref 3.5–5.0)
Alkaline Phosphatase: 169 U/L — ABNORMAL HIGH (ref 38–126)
Anion gap: 8 (ref 5–15)
BUN: 15 mg/dL (ref 8–23)
CO2: 27 mmol/L (ref 22–32)
Calcium: 9.2 mg/dL (ref 8.9–10.3)
Chloride: 100 mmol/L (ref 98–111)
Creatinine, Ser: 0.9 mg/dL (ref 0.61–1.24)
GFR, Estimated: >60 mL/min (ref >60)
Glucose, Bld: 107 mg/dL — ABNORMAL HIGH (ref 70–99)
Potassium: 4.4 mmol/L (ref 3.5–5.1)
Sodium: 135 mmol/L (ref 135–145)
Total Bilirubin: 0.6 mg/dL (ref <1.2)
Total Protein: 8.1 g/dL (ref 6.5–8.1)

## 2023-09-01 LAB — I-STAT CHEM 8, ED
BUN: 15 mg/dL (ref 8–23)
Calcium, Ion: 1.24 mmol/L (ref 1.15–1.40)
Chloride: 99 mmol/L (ref 98–111)
Creatinine, Ser: 0.9 mg/dL (ref 0.61–1.24)
Glucose, Bld: 103 mg/dL — ABNORMAL HIGH (ref 70–99)
HCT: 46 % (ref 39.0–52.0)
Hemoglobin: 15.6 g/dL (ref 13.0–17.0)
Potassium: 4.6 mmol/L (ref 3.5–5.1)
Sodium: 138 mmol/L (ref 135–145)
TCO2: 27 mmol/L (ref 22–32)

## 2023-09-01 MED ORDER — LEVETIRACETAM IN NACL 1000 MG/100ML IV SOLN
1000.0000 mg | Freq: Once | INTRAVENOUS | Status: AC
  Administered 2023-09-01: 1000 mg via INTRAVENOUS
  Filled 2023-09-01: qty 100

## 2023-09-01 NOTE — ED Triage Notes (Signed)
EMS reports from home, called out by family for possible seizure. Family did not witness seizure but states since Pt awoke this morning has been more confused than normal. Pt was confusing family members names, family states this is what happens just prior to seizure. Pt alert and answers questions, no post ictal on arrival.  BP 150/90 HR 76 RR 16 Sp02 96 RA CBG 125

## 2023-09-01 NOTE — ED Provider Notes (Signed)
[place] EMERGENCY DEPARTMENT AT [facility] Provider Note   CSN: [id number] Arrival date & time: 09/01/23  1158     History {Add pertinent medical, surgical, social history, OB history to HPI:1} Chief Complaint  Patient presents with   Altered Mental Status    [name] is a 72 y.o. male.  Patient has a history of seizures.  He had a seizure today and was confused afterwards.  He is back to his normal now.  Patient also complains of left hip pain   Altered Mental Status      Home Medications Prior to Admission medications   Medication Sig Start Date End Date Taking? Authorizing Provider  aspirin EC 81 MG tablet Take 1 tablet (81 mg total) by mouth daily. 03/04/20   Marcine Matar, MD  folic acid (FOLVITE) 1 MG tablet Take 1 tablet (1 mg total) by mouth daily. 02/25/23   Azucena Fallen, MD  levETIRAcetam (KEPPRA) 750 MG tablet Take 2 tablets (1,500 mg total) by mouth 2 (two) times daily. 02/25/23   Azucena Fallen, MD  memantine (NAMENDA) 10 MG tablet TAKE 1 TABLET BY MOUTH TWICE A DAY 06/28/23   Marcine Matar, MD  Multiple Vitamin (MULTIVITAMIN WITH MINERALS) TABS tablet Take 1 tablet by mouth daily. 02/25/23   Azucena Fallen, MD  pantoprazole (PROTONIX) 40 MG tablet TAKE 1 TABLET BY MOUTH EVERY DAY 06/28/23   Marcine Matar, MD  phenytoin (DILANTIN) 100 MG ER capsule Take 2 capsules (200 mg total) by mouth at bedtime. FOR SEIZURES 07/24/23   Marcine Matar, MD  PHENYTOIN INFATABS 50 MG tablet Chew 0.5 tablets (25 mg total) by mouth at bedtime. 07/24/23   Marcine Matar, MD  rosuvastatin (CRESTOR) 10 MG tablet Take 1 tablet (10 mg total) by mouth daily. STOP Simvastatin 08/20/23   Marcine Matar, MD  thiamine (VITAMIN B-1) 100 MG tablet Take 1 tablet (100 mg total) by mouth daily. 02/25/23   Azucena Fallen, MD      Allergies    Patient has no known allergies.    Review of Systems   Review of Systems  Physical  Exam Updated Vital Signs BP 129/79   Pulse 80   Temp 97.8 F (36.6 C) (Oral)   Resp 16   SpO2 93%  Physical Exam  ED Results / Procedures / Treatments   Labs (all labs ordered are listed, but only abnormal results are displayed) Labs Reviewed  COMPREHENSIVE METABOLIC PANEL - Abnormal; Notable for the following components:      Result Value   Glucose, Bld 107 (*)    Alkaline Phosphatase 169 (*)    All other components within normal limits  I-STAT CHEM 8, ED - Abnormal; Notable for the following components:   Glucose, Bld 103 (*)    All other components within normal limits  CBC WITH DIFFERENTIAL/PLATELET    EKG None  Radiology DG Hip Unilat W or Wo Pelvis 2-3 Views Left  Result Date: 09/01/2023 CLINICAL DATA:  Left hip pain for 5 days EXAM: DG HIP (WITH OR WITHOUT PELVIS) 2-3V LEFT COMPARISON:  11/26/2013 FINDINGS: Interval left hip arthroplasty. Right femoral head located with maintained joint space for age. Osteopenia. No acute fracture. Sacroiliac joints are symmetric. Vascular calcifications. IMPRESSION: No acute process or explanation for left hip pain. Status post left hip arthroplasty. Electronically Signed   By: Jeronimo Greaves M.D.   On: 09/01/2023 14:25    Procedures Procedures  {Document  cardiac monitor, telemetry assessment procedure when appropriate:1}  Medications Ordered in ED Medications  levETIRAcetam (KEPPRA) IVPB 1000 mg/100 mL premix (1,000 mg Intravenous New Bag/Given 09/01/23 1319)    ED Course/ Medical Decision Making/ A&P   {   Click here for ABCD2, HEART and other calculatorsREFRESH Note before signing :1}                              Medical Decision Making Amount and/or Complexity of Data Reviewed Radiology: ordered.  Risk Prescription drug management.   Patient with seizures.  Chemistry is unremarkable.  He will follow-up with his primary care doctor this week.  Patient also has painful left hip with normal x-rays.  He will take Tylenol  for the pain`  {Document critical care time when appropriate:1} {Document review of labs and clinical decision tools ie heart score, Chads2Vasc2 etc:1}  {Document your independent review of radiology images, and any outside records:1} {Document your discussion with family members, caretakers, and with consultants:1} {Document social determinants of health affecting pt's care:1} {Document your decision making why or why not admission, treatments were needed:1} Final Clinical Impression(s) / ED Diagnoses Final diagnoses:  Seizure (HCC)    Rx / DC Orders ED Discharge Orders     None

## 2023-09-01 NOTE — Discharge Instructions (Signed)
Follow-up with your doctor this week for recheck. 

## 2023-09-02 ENCOUNTER — Ambulatory Visit: Payer: Medicare HMO | Attending: Nurse Practitioner | Admitting: Nurse Practitioner

## 2023-09-02 ENCOUNTER — Encounter: Payer: Self-pay | Admitting: Nurse Practitioner

## 2023-09-02 ENCOUNTER — Ambulatory Visit: Payer: Self-pay | Admitting: *Deleted

## 2023-09-02 VITALS — BP 108/70 | HR 81 | Ht 67.0 in | Wt 161.8 lb

## 2023-09-02 DIAGNOSIS — G40909 Epilepsy, unspecified, not intractable, without status epilepticus: Secondary | ICD-10-CM

## 2023-09-02 DIAGNOSIS — Z09 Encounter for follow-up examination after completed treatment for conditions other than malignant neoplasm: Secondary | ICD-10-CM | POA: Diagnosis not present

## 2023-09-02 NOTE — Telephone Encounter (Signed)
  Chief Complaint: requesting appt s/p seizure per patient nephew [name]  Symptoms: seizure yesterday , called out aunt name , humming, patting feet hard prior to seizure. In a "daze". Hx alzheimer's but can verbalized nephews name. No seizure activity now  Frequency: yesterday  Pertinent Negatives: Patient denies difficulty breathing no seizures now.  Disposition: [] ED /[] Urgent Care (no appt availability in office) / [x] Appointment(In office/virtual)/ []  [facility]/ [] Home Care/ [] Refused Recommended Disposition /[] [facility]/ []  Follow-up with PCP Additional Notes:   No appt with PCP as requested. F/u per seizure appt with Z. Meredeth Ide, NP today .  Recommended to caller / caregiver if seizures noted again call 911.     Reason for Disposition  Epileptic seizures occur frequently (several per week)  Answer Assessment - Initial Assessment Questions 1. ONSET: "When did the seizure occur?"     Yesterday per nephew [name] 2. DURATION: "How long did the seizure last (or how long has it been happening)?" (e.g., seconds, minutes)  Note: Most seizures last less than 5 minutes.     Not sure was seen at [facility] 3. DESCRIPTION: "Describe what happened during the seizure." "Did the body become stiff?" "Was there any jerking?"  "Did they lose consciousness during the seizure?"     Approx 2 am humming, feet patting hard and called out aunts name prior to seizure 4. CIRCUMSTANCE: "What was the person doing when the seizure began?"      Na  5. MENTAL STATUS AFTER SEIZURE: "Does the person seem more groggy or sleepy?" "Does the person know who they are, who you are, and where they are now?"      Sleepy in a daze hx alzheimer's but can identify nephew appropriately now  6. PRIOR SEIZURES: "Has the person had a seizure (convulsion) before?" (e.g., epilepsy, other cause)  If Yes, ask: "When was the last time?" and "What happened last time?"      Yes , yesterday  7. EPILEPSY: "Does the  person have epilepsy?" Note: Check for medical ID bracelet.     na 8. MEDICINES: "Does the person take anticonvulsant medications?" (e.g., Yes, No; missed doses, any recent changes)     na 9. INJURY: "Was the person hurt or injured during the seizure?" (e.g., hit their head, bit their tongue)     na 10. OTHER SYMPTOMS: "Are there any other symptoms?" (e.g., fever, headache)       Alzheimer's . Denies sx now per nephew  106. PREGNANCY: "Is there any chance you are pregnant?" "When was your last menstrual period?"       na  Protocols used: [facility]

## 2023-09-02 NOTE — Progress Notes (Signed)
Assessment & Plan:  [name] was seen today for altered mental status.  Diagnoses and all orders for this visit:  Seizure disorder (HCC) -     Phenytoin level, free and total -     Levetiracetam level  Hospital discharge follow-up Follow up with neurology next week Discussed stroke symptoms vs seizure symptoms with nephew today   Patient has been counseled on age-appropriate routine health concerns for screening and prevention. These are reviewed and up-to-date. Referrals have been placed accordingly. Immunizations are up-to-date or declined.    Subjective:   Chief Complaint  Patient presents with   Altered Mental Status    [name] 72 y.o. male presents to office today  for hospital follow up. He is accompanied by his nephew.  Mr Marg is a patient of Dr. Henriette Combs who has a history significant for alzheimers and seizures. Has upcoming appt with neurology next Tuesday   He was evaluated in the ED yesterday and per ED NOTES:  EMS reports from home, called out by family for possible seizure. Family did not witness seizure but states since Pt awoke this morning has been more confused than normal. Pt was confusing family members names, family states this is what happens just prior to seizure. Pt alert and answers questions, no post ictal on arrival.   Today Mr Broom nephew states earlier this morning his aunt (Mr Tomac wife) stated Mr Bick was acting like he was in a daze but now he is back to baseline. We will check seizure medication levels today. Mr Son is pleasant alert and appropriate on exam today.    Review of Systems  Constitutional:  Negative for fever, malaise/fatigue and weight loss.  HENT: Negative.  Negative for nosebleeds.   Eyes: Negative.  Negative for blurred vision, double vision and photophobia.  Respiratory: Negative.  Negative for cough and shortness of breath.   Cardiovascular: Negative.  Negative for chest pain, palpitations and leg swelling.   Gastrointestinal: Negative.  Negative for heartburn, nausea and vomiting.  Musculoskeletal: Negative.  Negative for myalgias.  Neurological:  Positive for weakness (baseline. uses cane to assist with mobility). Negative for dizziness, focal weakness, seizures and headaches.  Psychiatric/Behavioral:  Positive for memory loss (alzheimers disease). Negative for suicidal ideas.     Past Medical History:  Diagnosis Date   Alcoholism (HCC)    History of, not active   Chronic hepatitis C (HCC)    Dementia (HCC)    Diverticulosis    Gait disorder    Gingival hypertrophy    Secondary to Dilantin   Hemiparesis and alteration of sensations as late effects of stroke (HCC) 01/25/2017   Left hemiparesis   Hiatal hernia    History of alcoholism (HCC)    Hx of ischemic vertebrobasilar artery thalamic stroke    Right   Internal hemorrhoids    Memory disorder 03/04/2013   Primary biliary cirrhosis (HCC)    Seizures (HCC)    Stroke (HCC)    Right frontal, right thalamic   Tubular adenoma of colon    Ulnar neuropathy of left upper extremity     Past Surgical History:  Procedure Laterality Date   COLONOSCOPY WITH PROPOFOL N/A 07/10/2017   Procedure: COLONOSCOPY WITH PROPOFOL;  Surgeon: Kathi Der, MD;  Location: [facility];  Service: Gastroenterology;  Laterality: N/A;   ESOPHAGOGASTRODUODENOSCOPY (EGD) WITH PROPOFOL N/A 07/10/2017   Procedure: ESOPHAGOGASTRODUODENOSCOPY (EGD) WITH PROPOFOL;  Surgeon: Kathi Der, MD;  Location: [facility];  Service: Gastroenterology;  Laterality: N/A;  HIP ARTHROPLASTY Left 11/26/2013   Procedure: LEFT HIP HEMIARTHROPLASTY;  Surgeon: Kathryne Hitch, MD;  Location: [facility] OR;  Service: Orthopedics;  Laterality: Left;   WRIST SURGERY Left 95 & 96    Family History  Problem Relation Age of Onset   Pulmonary embolism Mother    Liver disease Father    Diabetes Sister    Hypertension Sister    Hypertension Sister    Hypertension Sister     Seizures Neg Hx     Social History Reviewed with no changes to be made today.   Outpatient Medications Prior to Visit  Medication Sig Dispense Refill   aspirin EC 81 MG tablet Take 1 tablet (81 mg total) by mouth daily. 100 tablet 0   folic acid (FOLVITE) 1 MG tablet Take 1 tablet (1 mg total) by mouth daily. 30 tablet 0   levETIRAcetam (KEPPRA) 750 MG tablet Take 2 tablets (1,500 mg total) by mouth 2 (two) times daily. 360 tablet 1   memantine (NAMENDA) 10 MG tablet TAKE 1 TABLET BY MOUTH TWICE A DAY 180 tablet 0   Multiple Vitamin (MULTIVITAMIN WITH MINERALS) TABS tablet Take 1 tablet by mouth daily. 30 tablet 0   pantoprazole (PROTONIX) 40 MG tablet TAKE 1 TABLET BY MOUTH EVERY DAY 90 tablet 0   phenytoin (DILANTIN) 100 MG ER capsule Take 2 capsules (200 mg total) by mouth at bedtime. FOR SEIZURES 180 capsule 1   PHENYTOIN INFATABS 50 MG tablet Chew 0.5 tablets (25 mg total) by mouth at bedtime. 45 tablet 1   rosuvastatin (CRESTOR) 10 MG tablet Take 1 tablet (10 mg total) by mouth daily. STOP Simvastatin 90 tablet 3   thiamine (VITAMIN B-1) 100 MG tablet Take 1 tablet (100 mg total) by mouth daily. 30 tablet 0   No facility-administered medications prior to visit.    No Known Allergies     Objective:    BP 108/70 (BP Location: Left Arm, Patient Position: Sitting, Cuff Size: Normal)   Pulse 81   Ht 5\' 7"  (1.702 m)   Wt 161 lb 12.8 oz (73.4 kg)   SpO2 95%   BMI 25.34 kg/m  Wt Readings from Last 3 Encounters:  09/02/23 161 lb 12.8 oz (73.4 kg)  08/20/23 165 lb (74.8 kg)  06/25/23 168 lb (76.2 kg)    Physical Exam Vitals and nursing note reviewed.  Constitutional:      Appearance: He is well-developed.  HENT:     Head: Normocephalic and atraumatic.  Cardiovascular:     Rate and Rhythm: Normal rate and regular rhythm.     Heart sounds: Normal heart sounds. No murmur heard.    No friction rub. No gallop.  Pulmonary:     Effort: Pulmonary effort is normal. No tachypnea  or respiratory distress.     Breath sounds: Normal breath sounds. No decreased breath sounds, wheezing, rhonchi or rales.  Chest:     Chest wall: No tenderness.  Abdominal:     General: Bowel sounds are normal.     Palpations: Abdomen is soft.  Musculoskeletal:        General: Normal range of motion.     Cervical back: Normal range of motion.  Skin:    General: Skin is warm and dry.  Neurological:     Mental Status: He is alert and oriented to person, place, and time.     Coordination: Coordination normal.  Psychiatric:        Behavior: Behavior normal. Behavior is cooperative.  Patient has been counseled extensively about nutrition and exercise as well as the importance of adherence with medications and regular follow-up. The patient was given clear instructions to go to ER or return to medical center if symptoms don't improve, worsen or new problems develop. The patient verbalized understanding.   Follow-up: Return if symptoms worsen or fail to improve.   Claiborne Rigg, FNP-BC [facility] [place], [place] [phone]   09/02/2023, 3:00 PM

## 2023-09-05 LAB — PHENYTOIN LEVEL, FREE AND TOTAL
Phenytoin, Free: 0.8 ug/mL — ABNORMAL LOW (ref 1.0–2.0)
Phenytoin, Serum: 13.4 ug/mL (ref 10.0–20.0)

## 2023-09-05 LAB — LEVETIRACETAM LEVEL: Levetiracetam Lvl: 43 ug/mL — ABNORMAL HIGH (ref 10.0–40.0)

## 2023-09-10 ENCOUNTER — Encounter: Payer: Self-pay | Admitting: Neurology

## 2023-09-10 ENCOUNTER — Ambulatory Visit (INDEPENDENT_AMBULATORY_CARE_PROVIDER_SITE_OTHER): Payer: Medicare HMO | Admitting: Neurology

## 2023-09-10 VITALS — BP 131/70 | HR 94 | Resp 16 | Ht 69.0 in

## 2023-09-10 DIAGNOSIS — F03B18 Unspecified dementia, moderate, with other behavioral disturbance: Secondary | ICD-10-CM

## 2023-09-10 DIAGNOSIS — G40009 Localization-related (focal) (partial) idiopathic epilepsy and epileptic syndromes with seizures of localized onset, not intractable, without status epilepticus: Secondary | ICD-10-CM | POA: Diagnosis not present

## 2023-09-10 DIAGNOSIS — R269 Unspecified abnormalities of gait and mobility: Secondary | ICD-10-CM

## 2023-09-10 NOTE — Progress Notes (Signed)
PATIENT: [name] DOB: 23-May-1951  REASON FOR VISIT: follow up HISTORY FROM: patient  HISTORY OF PRESENT ILLNESS: Today 09/10/23 Patient presents today for follow-up, he is accompanied by nephew. Last visit was in January 2023.  At that time, plan was to continue his levetiracetam and phenytoin and also Namenda for his dementia.  He comes today with his nephew after a breakthrough seizure on November 10.  Nephew tells me at that time he was compliant with his medication, his antiseizure medication level were taken at the hospital and were normal.  They deny any provoking factors such as lack of sleep, no infection and did not miss his medication.  Since then, he has been doing well, he has not had any additional event.   In terms of his memory, it is getting worse.  He lives at home with his nephew who takes care of him.  He is able to bathe himself, dress himself and feed himself otherwise needs assistance in all other ADLs.  INTERVAL HISTORY 11/21/2021 Patient presents today for follow-up, he is accompanied by his sister who lives with patient and his niece.  He has been doing well since last visit, no seizures.  Actually, the last seizure was more than 2 years ago.  He is compliant with his medications.  No recent falls.  Sister reports issue with his dementia, he usually wakes up early in the morning and start wandering, at times he has walk outside the house required sister to call 911 to bring him back.  He is able to bathe himself, clean himself, feed himself but other than that he depends on sister.  No other complaint.  Last phenytoin level was 13.3.   Update 01/23/2021 SS: [name] is a 72 year old male with history of seizures and memory disorder, left hemiparesis.  On Dilantin and Keppra.  He had left-sided Bell's palsy in November 2021.  No recent seizures.  Left-sided Bell's palsy has resolved.  Can now completely close the left eye, with smiling very mild left eye closure,  unsure if baseline or not.  Continues to live with his sister, at times does not want to take a shower.  He does well to take his medications, his sister manages these.  No falls, uses a walker.  They have no complaints today.  He is here today for follow-up accompanied by his sister, Verlon Au.  Update 08/23/2020 SS:[name] is a 72 year old male history of seizures and memory disorder.  He is on Dilantin and Keppra.  He lives with his sister, uses a walker.  Presented to the ER 07/25/20 with left ptosis, left-sided asymmetric smile, inability to raise his left forehead.  MRI of the brain was negative for acute findings, diagnosed with left-sided Bell's palsy, was treated with Valtrex and prednisone, given onset of Bell's palsy within 24 hours.  Was unable to fully close his left eye.  CMP showed mildly elevated alk phos 137, glucose 106.  He is about 75% better, able to close the eye about 85%, still left-sided asymmetric smile, left eye closure weakness.  No drooling. Has been taping his eye at night.  No seizures reported, tolerating medications.  No falls.  No history of DM.  Presents today for evaluation accompanied by his sister.  HISTORY 02/18/2020 SS: [name] is a 72 year old male with history of seizures and memory disorder.  He was started on Dilantin around April 2020 after reported seizures (frequent staring spells every 2 weeks, 1 episode lying down,  his whole body drew up).  He has been established at a good maintenance dose of Dilantin 225 mg daily, along with Keppra 1500 mg daily.  Since last seen, his sister reports 1-2 episodes of brief staring episodes, unsure exactly if these represent true seizures.  He lives with his sister, Verlon Au.  She manages his medications and helps with his care.  He has a left hemiparesis.  He uses a walker.  He has seen GI for elevated alkaline phosphatase, in the setting of a positive antimitochondrial antibody and anti-smooth muscle antibody, undergoing evaluation.  Is overall doing well with medications, is overall pleased, no recent falls.   REVIEW OF SYSTEMS: Out of a complete 14 system review of symptoms, the patient complains only of the following symptoms, and all other reviewed systems are negative.  Seizure  ALLERGIES: No Known Allergies  HOME MEDICATIONS: Outpatient Medications Prior to Visit  Medication Sig Dispense Refill   aspirin EC 81 MG tablet Take 1 tablet (81 mg total) by mouth daily. 100 tablet 0   folic acid (FOLVITE) 1 MG tablet Take 1 tablet (1 mg total) by mouth daily. 30 tablet 0   levETIRAcetam (KEPPRA) 750 MG tablet Take 2 tablets (1,500 mg total) by mouth 2 (two) times daily. 360 tablet 1   memantine (NAMENDA) 10 MG tablet TAKE 1 TABLET BY MOUTH TWICE A DAY 180 tablet 0   Multiple Vitamin (MULTIVITAMIN WITH MINERALS) TABS tablet Take 1 tablet by mouth daily. 30 tablet 0   pantoprazole (PROTONIX) 40 MG tablet TAKE 1 TABLET BY MOUTH EVERY DAY 90 tablet 0   phenytoin (DILANTIN) 100 MG ER capsule Take 2 capsules (200 mg total) by mouth at bedtime. FOR SEIZURES 180 capsule 1   PHENYTOIN INFATABS 50 MG tablet Chew 0.5 tablets (25 mg total) by mouth at bedtime. 45 tablet 1   rosuvastatin (CRESTOR) 10 MG tablet Take 1 tablet (10 mg total) by mouth daily. STOP Simvastatin 90 tablet 3   thiamine (VITAMIN B-1) 100 MG tablet Take 1 tablet (100 mg total) by mouth daily. 30 tablet 0   No facility-administered medications prior to visit.    PAST MEDICAL HISTORY: Past Medical History:  Diagnosis Date   Alcoholism (HCC)    History of, not active   Chronic hepatitis C (HCC)    Dementia (HCC)    Diverticulosis    Gait disorder    Gingival hypertrophy    Secondary to Dilantin   Hemiparesis and alteration of sensations as late effects of stroke (HCC) 01/25/2017   Left hemiparesis   Hiatal hernia    History of alcoholism (HCC)    Hx of ischemic vertebrobasilar artery thalamic stroke    Right   Internal hemorrhoids    Memory  disorder 03/04/2013   Primary biliary cirrhosis (HCC)    Seizures (HCC)    Stroke (HCC)    Right frontal, right thalamic   Tubular adenoma of colon    Ulnar neuropathy of left upper extremity     PAST SURGICAL HISTORY: Past Surgical History:  Procedure Laterality Date   COLONOSCOPY WITH PROPOFOL N/A 07/10/2017   Procedure: COLONOSCOPY WITH PROPOFOL;  Surgeon: Kathi Der, MD;  Location: [facility];  Service: Gastroenterology;  Laterality: N/A;   ESOPHAGOGASTRODUODENOSCOPY (EGD) WITH PROPOFOL N/A 07/10/2017   Procedure: ESOPHAGOGASTRODUODENOSCOPY (EGD) WITH PROPOFOL;  Surgeon: Kathi Der, MD;  Location: [facility];  Service: Gastroenterology;  Laterality: N/A;   HIP ARTHROPLASTY Left 11/26/2013   Procedure: LEFT HIP HEMIARTHROPLASTY;  Surgeon: Kathryne Hitch,  MD;  Location: [facility] OR;  Service: Orthopedics;  Laterality: Left;   WRIST SURGERY Left 95 & 96    FAMILY HISTORY: Family History  Problem Relation Age of Onset   Pulmonary embolism Mother    Liver disease Father    Diabetes Sister    Hypertension Sister    Hypertension Sister    Hypertension Sister    Seizures Neg Hx     SOCIAL HISTORY: Social History   Socioeconomic History   Marital status: Divorced    Spouse name: Not on file   Number of children: 0   Years of education: 10   Highest education level: Not on file  Occupational History   Not on file  Tobacco Use   Smoking status: Former    Current packs/day: 0.00    Types: Cigarettes    Quit date: 01/02/2014    Years since quitting: 9.6   Smokeless tobacco: Former  Building services engineer status: Never Used  Substance and Sexual Activity   Alcohol use: No    Alcohol/week: 0.0 standard drinks of alcohol    Comment: Former Alcoholic   Drug use: No   Sexual activity: Yes  Other Topics Concern   Not on file  Social History Narrative   Patient is single and lives with a friend and her son.   Patient does not have any children.   Patient is  disabled.   Patient has a 10 th grade education.   Patient is left-handed.   Patient drinks some soda but not everyday.   Social Determinants of Health   Financial Resource Strain: Low Risk  (06/25/2023)   Overall Financial Resource Strain (CARDIA)    Difficulty of Paying Living Expenses: Not hard at all  Food Insecurity: No Food Insecurity (06/25/2023)   Hunger Vital Sign    Worried About Running Out of Food in the Last Year: Never true    Ran Out of Food in the Last Year: Never true  Transportation Needs: No Transportation Needs (06/25/2023)   PRAPARE - Administrator, Civil Service (Medical): No    Lack of Transportation (Non-Medical): No  Physical Activity: Inactive (06/25/2023)   Exercise Vital Sign    Days of Exercise per Week: 0 days    Minutes of Exercise per Session: 0 min  Stress: No Stress Concern Present (06/25/2023)   [employer] of Occupational Health - Occupational Stress Questionnaire    Feeling of Stress : Not at all  Social Connections: Socially Isolated (06/25/2023)   Social Connection and Isolation Panel [NHANES]    Frequency of Communication with Friends and Family: More than three times a week    Frequency of Social Gatherings with Friends and Family: Three times a week    Attends Religious Services: Never    Active Member of Clubs or Organizations: No    Attends Banker Meetings: Never    Marital Status: Divorced  Catering manager Violence: Not At Risk (06/25/2023)   Humiliation, Afraid, Rape, and Kick questionnaire    Fear of Current or Ex-Partner: No    Emotionally Abused: No    Physically Abused: No    Sexually Abused: No    PHYSICAL EXAM  Vitals:   09/10/23 0926  BP: 131/70  Pulse: 94  Resp: 16  Height: 5\' 9"  (1.753 m)   Body mass index is 23.89 kg/m.  Generalized: Well developed, in no acute distress     09/10/2023    9:29 AM 11/21/2021  10:02 AM 06/29/2021    3:41 PM  MMSE - Mini Mental State Exam  Not completed:   Unable to complete   Orientation to time 0  2  Orientation to Place 4  2  Registration 3  2  Attention/ Calculation 0  3  Recall 0  0  Language- name 2 objects 2  2  Language- repeat 0  1  Language- follow 3 step command 1  3  Language- read & follow direction 0  0  Write a sentence 0  0  Copy design 0  0  Total score 10  15    Neurological examination  Mentation: Alert, limited verbal response, most history is provided by his nephew, follows most exam commands well, is well-appearing and groomed Cranial nerve II-XII:  Extraocular movements were full, visual field were full on confrontational test.  Motor: Good strength of all extremities, mild spasticity left upper extremity Sensory: Normal to right and left side Coordination: Mild dysmetria on the left finger-nose-finger Gait and station: Rises from seated position with pushoff, ambulates with a cane, is wide-based, limp on the left Reflexes: Deep tendon reflexes are symmetric but decreased throughout   DIAGNOSTIC DATA (LABS, IMAGING, TESTING) - I reviewed patient records, labs, notes, testing and imaging myself where available.  Lab Results  Component Value Date   WBC 5.7 09/01/2023   HGB 15.6 09/01/2023   HCT 46.0 09/01/2023   MCV 94.4 09/01/2023   PLT 173 09/01/2023      Component Value Date/Time   NA 138 09/01/2023 1303   NA 143 08/20/2023 1539   K 4.6 09/01/2023 1303   CL 99 09/01/2023 1303   CO2 27 09/01/2023 1255   GLUCOSE 103 (H) 09/01/2023 1303   BUN 15 09/01/2023 1303   BUN 14 08/20/2023 1539   CREATININE 0.90 09/01/2023 1303   CREATININE 0.93 04/08/2017 1642   CALCIUM 9.2 09/01/2023 1255   PROT 8.1 09/01/2023 1255   PROT 7.4 08/20/2023 1539   ALBUMIN 4.4 09/01/2023 1255   ALBUMIN 4.4 08/20/2023 1539   AST 18 09/01/2023 1255   ALT 21 09/01/2023 1255   ALKPHOS 169 (H) 09/01/2023 1255   BILITOT 0.6 09/01/2023 1255   BILITOT 0.2 08/20/2023 1539   GFRNONAA >60 09/01/2023 1255   GFRNONAA 86 04/08/2017  1642   GFRAA >60 07/25/2020 2138   GFRAA >89 04/08/2017 1642   Lab Results  Component Value Date   CHOL 122 08/20/2023   HDL 32 (L) 08/20/2023   LDLCALC 58 08/20/2023   TRIG 196 (H) 08/20/2023   CHOLHDL 3.8 08/20/2023   Lab Results  Component Value Date   HGBA1C 5.3 05/23/2016   No results found for: "VITAMINB12" Lab Results  Component Value Date   TSH 1.270 10/26/2021   ASSESSMENT AND PLAN 72 y.o. year old male  has a past medical history of Alcoholism (HCC), Chronic hepatitis C (HCC), Dementia (HCC), Diverticulosis, Gait disorder, Gingival hypertrophy, Hemiparesis and alteration of sensations as late effects of stroke (HCC) (01/25/2017), Hiatal hernia, History of alcoholism (HCC), ischemic vertebrobasilar artery thalamic stroke, Internal hemorrhoids, Memory disorder (03/04/2013), Primary biliary cirrhosis (HCC), Seizures (HCC), Stroke (HCC), Tubular adenoma of colon, and Ulnar neuropathy of left upper extremity. here with:  1.  Seizure disorder 2.  Cerebrovascular disease, left hemiparesis 3.  Left-sided Bell's palsy (resolved) 4.  Gait disorder 5. Dementia   -Continue Keppra 750 mg tablet, 2 tablets twice a day -Continue Dilantin total of 225 mg at bedtime  -Check routine labs  today including Vitamin D level -Follow-up in 1 year or sooner if needed, call for seizure activity    Windell Norfolk, MD 09/10/2023, 10:10 AM [facility] [address] [place], [place] [address] [phone]

## 2023-09-12 ENCOUNTER — Other Ambulatory Visit: Payer: Self-pay

## 2023-09-12 ENCOUNTER — Emergency Department (HOSPITAL_COMMUNITY): Payer: Medicare HMO

## 2023-09-12 ENCOUNTER — Encounter (HOSPITAL_COMMUNITY): Payer: Self-pay

## 2023-09-12 ENCOUNTER — Emergency Department (HOSPITAL_COMMUNITY)
Admission: EM | Admit: 2023-09-12 | Discharge: 2023-09-12 | Disposition: A | Discharge status: Home / Self Care | Payer: Medicare HMO | Attending: Student | Admitting: Student

## 2023-09-12 ENCOUNTER — Telehealth: Payer: Self-pay | Admitting: Neurology

## 2023-09-12 DIAGNOSIS — Z7982 Long term (current) use of aspirin: Secondary | ICD-10-CM | POA: Diagnosis not present

## 2023-09-12 DIAGNOSIS — F0390 Unspecified dementia without behavioral disturbance: Secondary | ICD-10-CM | POA: Insufficient documentation

## 2023-09-12 DIAGNOSIS — M1712 Unilateral primary osteoarthritis, left knee: Secondary | ICD-10-CM | POA: Diagnosis not present

## 2023-09-12 DIAGNOSIS — Z96642 Presence of left artificial hip joint: Secondary | ICD-10-CM | POA: Diagnosis not present

## 2023-09-12 DIAGNOSIS — G9389 Other specified disorders of brain: Secondary | ICD-10-CM | POA: Diagnosis not present

## 2023-09-12 DIAGNOSIS — R569 Unspecified convulsions: Secondary | ICD-10-CM | POA: Diagnosis not present

## 2023-09-12 DIAGNOSIS — Z043 Encounter for examination and observation following other accident: Secondary | ICD-10-CM | POA: Diagnosis not present

## 2023-09-12 DIAGNOSIS — Z79899 Other long term (current) drug therapy: Secondary | ICD-10-CM | POA: Insufficient documentation

## 2023-09-12 DIAGNOSIS — Z87891 Personal history of nicotine dependence: Secondary | ICD-10-CM | POA: Insufficient documentation

## 2023-09-12 DIAGNOSIS — W19XXXA Unspecified fall, initial encounter: Secondary | ICD-10-CM | POA: Diagnosis not present

## 2023-09-12 LAB — COMPREHENSIVE METABOLIC PANEL
ALT: 29 U/L (ref 0–44)
AST: 20 U/L (ref 15–41)
Albumin: 4.1 g/dL (ref 3.5–5.0)
Alkaline Phosphatase: 158 U/L — ABNORMAL HIGH (ref 38–126)
Anion gap: 5 (ref 5–15)
BUN: 17 mg/dL (ref 8–23)
CO2: 26 mmol/L (ref 22–32)
Calcium: 8.9 mg/dL (ref 8.9–10.3)
Chloride: 103 mmol/L (ref 98–111)
Creatinine, Ser: 0.89 mg/dL (ref 0.61–1.24)
GFR, Estimated: >60 mL/min (ref >60)
Glucose, Bld: 105 mg/dL — ABNORMAL HIGH (ref 70–99)
Potassium: 4.4 mmol/L (ref 3.5–5.1)
Sodium: 134 mmol/L — ABNORMAL LOW (ref 135–145)
Total Bilirubin: 0.6 mg/dL (ref <1.2)
Total Protein: 7.6 g/dL (ref 6.5–8.1)

## 2023-09-12 LAB — CBC WITH DIFFERENTIAL/PLATELET
Abs Immature Granulocytes: 0.04 10*3/uL (ref 0.00–0.07)
Basophils Absolute: 0 10*3/uL (ref 0.0–0.1)
Basophils Relative: 0 %
Eosinophils Absolute: 0.1 10*3/uL (ref 0.0–0.5)
Eosinophils Relative: 1 %
HCT: 40.1 % (ref 39.0–52.0)
Hemoglobin: 14.2 g/dL (ref 13.0–17.0)
Immature Granulocytes: 1 %
Lymphocytes Relative: 12 %
Lymphs Abs: 0.8 10*3/uL (ref 0.7–4.0)
MCH: 33.5 pg (ref 26.0–34.0)
MCHC: 35.4 g/dL (ref 30.0–36.0)
MCV: 94.6 fL (ref 80.0–100.0)
Monocytes Absolute: 0.6 10*3/uL (ref 0.1–1.0)
Monocytes Relative: 9 %
Neutro Abs: 5 10*3/uL (ref 1.7–7.7)
Neutrophils Relative %: 77 %
Platelets: 166 10*3/uL (ref 150–400)
RBC: 4.24 MIL/uL (ref 4.22–5.81)
RDW: 12 % (ref 11.5–15.5)
WBC: 6.4 10*3/uL (ref 4.0–10.5)
nRBC: 0 % (ref 0.0–0.2)

## 2023-09-12 MED ORDER — KETOROLAC TROMETHAMINE 15 MG/ML IJ SOLN
15.0000 mg | Freq: Once | INTRAMUSCULAR | Status: AC
Start: 2023-09-12 — End: 2023-09-12
  Administered 2023-09-12: 15 mg via INTRAVENOUS
  Filled 2023-09-12: qty 1

## 2023-09-12 MED ORDER — SODIUM CHLORIDE 0.9 % IV SOLN
50.0000 mg | Freq: Once | INTRAVENOUS | Status: AC
  Administered 2023-09-12: 50 mg via INTRAVENOUS
  Filled 2023-09-12 (×2): qty 5

## 2023-09-12 MED ORDER — LACOSAMIDE 100 MG PO TABS: ORAL_TABLET | ORAL | 0 refills | Status: DC

## 2023-09-12 MED ORDER — LEVETIRACETAM IN NACL 1500 MG/100ML IV SOLN
1500.0000 mg | Freq: Once | INTRAVENOUS | Status: AC
  Administered 2023-09-12: 1500 mg via INTRAVENOUS
  Filled 2023-09-12: qty 100

## 2023-09-12 NOTE — Telephone Encounter (Signed)
Patient's nephew called in today stated patient had another seizure today and was told to call in if this happened. He is currently in the ED.

## 2023-09-12 NOTE — ED Provider Notes (Signed)
[place] EMERGENCY DEPARTMENT AT [facility] Provider Note  CSN: [id number] Arrival date & time: 09/12/23 1028  Chief Complaint(s) Seizures  HPI QUENTINE RAGIN is a 72 y.o. male with PMH dementia, CVA with left-sided hemiparesis, primary biliary cirrhosis, seizure disorder on Keppra and Dilantin who presents emergency department for evaluation of a seizure.  Patient has been seen in the emergency room multiple times for similar complaints as recently as 08/01/2023 and was seen in the outpatient setting by his neurologist on 09/10/2023 with normal Keppra levels, slight decrease and free Dilantin levels.  Seizure was witnessed today by patient's son who states that the patient was standing, had a grand mall seizure and landed on left hip and leg.  Patient arrives postictal.  Endorsing hip pain and leg pain but denies chest pain, shortness of breath, abdominal pain, nausea, vomiting or other systemic symptoms.  Past Medical History Past Medical History:  Diagnosis Date   Alcoholism (HCC)    History of, not active   Chronic hepatitis C (HCC)    Dementia (HCC)    Diverticulosis    Gait disorder    Gingival hypertrophy    Secondary to Dilantin   Hemiparesis and alteration of sensations as late effects of stroke (HCC) 01/25/2017   Left hemiparesis   Hiatal hernia    History of alcoholism (HCC)    Hx of ischemic vertebrobasilar artery thalamic stroke    Right   Internal hemorrhoids    Memory disorder 03/04/2013   Primary biliary cirrhosis (HCC)    Seizures (HCC)    Stroke (HCC)    Right frontal, right thalamic   Tubular adenoma of colon    Ulnar neuropathy of left upper extremity    Patient Active Problem List   Diagnosis Date Noted   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   Vascular dementia with other behavioral disturbance, unspecified dementia severity (HCC) 08/20/2023   Pain due to onychomycosis of toenails of both feet 02/28/2023   Sepsis (HCC) 02/23/2023    Barrett's esophagus 12/25/2021   Bell's palsy 08/23/2020   Elevated alkaline phosphatase level 11/17/2019   Adenomatous polyp of colon 09/02/2017   Functional urinary incontinence 09/02/2017   Hemiparesis and alteration of sensations as late effects of stroke (HCC) 01/25/2017   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/23/2015   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 Authorizing Provider  aspirin EC 81 MG tablet Take 1 tablet (81 mg total) by mouth daily. 03/04/20  Yes Marcine Matar, MD  Lacosamide 100 MG TABS Take 0.5 tablets (50 mg total) by mouth in the morning and at bedtime for 14 days, THEN 1 tablet (100 mg total) in the morning and at bedtime for 28 days. 09/12/23 10/24/23 Yes Khrystian Schauf, MD  levETIRAcetam (KEPPRA) 750 MG tablet Take 2 tablets (1,500 mg total) by mouth 2 (two) times daily. 02/25/23  Yes Azucena Fallen, MD  memantine (NAMENDA) 10 MG tablet TAKE 1 TABLET BY MOUTH TWICE A DAY 06/28/23  Yes Marcine Matar, MD  pantoprazole (PROTONIX) 40 MG tablet TAKE 1 TABLET BY MOUTH EVERY  DAY 06/28/23  Yes Marcine Matar, MD  phenytoin (DILANTIN) 100 MG ER capsule Take 2 capsules (200 mg total) by mouth at bedtime. FOR SEIZURES 07/24/23  Yes Marcine Matar, MD  PHENYTOIN INFATABS 50 MG tablet Chew 0.5 tablets (25 mg total) by mouth at bedtime. 07/24/23  Yes Marcine Matar, MD  rosuvastatin (CRESTOR) 10 MG tablet Take 1 tablet (10 mg total) by mouth daily. STOP Simvastatin 08/20/23  Yes Marcine Matar, MD                                                                                                                                     Past Surgical History Past Surgical History:  Procedure Laterality Date   COLONOSCOPY WITH PROPOFOL N/A 07/10/2017   Procedure: COLONOSCOPY WITH PROPOFOL;  Surgeon: Kathi Der, MD;  Location: [facility] ENDOSCOPY;  Service: Gastroenterology;  Laterality: N/A;   ESOPHAGOGASTRODUODENOSCOPY (EGD) WITH PROPOFOL N/A 07/10/2017   Procedure: ESOPHAGOGASTRODUODENOSCOPY (EGD) WITH PROPOFOL;  Surgeon: Kathi Der, MD;  Location: [facility] ENDOSCOPY;  Service: Gastroenterology;  Laterality: N/A;   HIP ARTHROPLASTY Left 11/26/2013   Procedure: LEFT HIP HEMIARTHROPLASTY;  Surgeon: Kathryne Hitch, MD;  Location: [facility] OR;  Service: Orthopedics;  Laterality: Left;   WRIST SURGERY Left 95 & 96   Family History Family History  Problem Relation Age of Onset   Pulmonary embolism Mother    Liver disease Father    Diabetes Sister    Hypertension Sister    Hypertension Sister    Hypertension Sister    Seizures Neg Hx     Social History Social History   Tobacco Use   Smoking status: Former    Current packs/day: 0.00    Types: Cigarettes    Quit date: 01/02/2014    Years since quitting: 9.6   Smokeless tobacco: Former  Building services engineer status: Never Used  Substance Use Topics   Alcohol use: No    Alcohol/week: 0.0 standard drinks of alcohol    Comment: Former Alcoholic   Drug use: No   Allergies Patient has no known allergies.  Review of Systems Review of Systems  Musculoskeletal:  Positive for arthralgias and myalgias.  Neurological:  Positive for seizures.    Physical Exam Vital Signs  I have reviewed the triage vital signs BP 115/74   Pulse (!) 59   Temp 98.2 F (36.8 C) (Oral)   Resp 15   SpO2 95%   Physical Exam Constitutional:      General: He is not in acute distress.    Appearance: Normal appearance.  HENT:     Head: Normocephalic and atraumatic.     Nose: No congestion  or rhinorrhea.  Eyes:     General:        Right eye: No discharge.        Left eye: No discharge.     Extraocular Movements: Extraocular movements intact.  Pupils: Pupils are equal, round, and reactive to light.  Cardiovascular:     Rate and Rhythm: Normal rate and regular rhythm.     Heart sounds: No murmur heard. Pulmonary:     Effort: No respiratory distress.     Breath sounds: No wheezing or rales.  Abdominal:     General: There is no distension.     Tenderness: There is no abdominal tenderness.  Musculoskeletal:        General: Tenderness present. Normal range of motion.     Cervical back: Normal range of motion.  Skin:    General: Skin is warm and dry.  Neurological:     General: No focal deficit present.     Mental Status: He is alert. Mental status is at baseline.     Cranial Nerves: No cranial nerve deficit.     Sensory: No sensory deficit.     Motor: Weakness present.     ED Results and Treatments Labs (all labs ordered are listed, but only abnormal results are displayed) Labs Reviewed  COMPREHENSIVE METABOLIC PANEL - Abnormal; Notable for the following components:      Result Value   Sodium 134 (*)    Glucose, Bld 105 (*)    Alkaline Phosphatase 158 (*)    All other components within normal limits  CBC WITH DIFFERENTIAL/PLATELET  URINALYSIS, ROUTINE W REFLEX MICROSCOPIC  RAPID URINE DRUG SCREEN, HOSP PERFORMED                                                                                                                          Radiology DG Hip Unilat W or Wo Pelvis 2-3 Views Left  Result Date: 09/12/2023 CLINICAL DATA:  Fall.  Rule out fracture. EXAM: DG HIP (WITH OR WITHOUT PELVIS) 2-3V LEFT; LEFT FEMUR 2 VIEWS COMPARISON:  09/01/2023. FINDINGS: Pelvis is intact with normal and symmetric sacroiliac joints. No acute fracture or dislocation. No aggressive osseous lesion. Visualized sacral arcuate lines are unremarkable. Unremarkable symphysis pubis.  There are mild degenerative changes of the right hip joint without significant joint space narrowing. Osteophytosis of the superior acetabulum. Redemonstration of left total hip arthroplasty. The hardware is intact. No periprosthetic fracture or lucency. No interval change in alignment, when compared to the available recent prior examination. Redemonstration of heterotopic ossification along the lateral aspect of the hip joint. There are mild degenerative changes of the left knee joint. No radiopaque foreign bodies. IMPRESSION: *No acute osseous abnormality of the pelvis, left hip or left femur. Electronically Signed   By: Jules Schick M.D.   On: 09/12/2023 13:46   DG Femur Min 2 Views Left  Result Date: 09/12/2023 CLINICAL DATA:  Fall.  Rule out fracture. EXAM: DG HIP (WITH OR WITHOUT PELVIS) 2-3V LEFT; LEFT FEMUR 2 VIEWS COMPARISON:  09/01/2023. FINDINGS: Pelvis is intact with normal and symmetric sacroiliac joints. No acute fracture or dislocation. No aggressive osseous lesion. Visualized sacral arcuate lines are unremarkable. Unremarkable symphysis pubis. There are  mild degenerative changes of the right hip joint without significant joint space narrowing. Osteophytosis of the superior acetabulum. Redemonstration of left total hip arthroplasty. The hardware is intact. No periprosthetic fracture or lucency. No interval change in alignment, when compared to the available recent prior examination. Redemonstration of heterotopic ossification along the lateral aspect of the hip joint. There are mild degenerative changes of the left knee joint. No radiopaque foreign bodies. IMPRESSION: *No acute osseous abnormality of the pelvis, left hip or left femur. Electronically Signed   By: Jules Schick M.D.   On: 09/12/2023 13:46   CT Head Wo Contrast  Result Date: 09/12/2023 CLINICAL DATA:  Seizure, new-onset, no history of trauma EXAM: CT HEAD WITHOUT CONTRAST TECHNIQUE: Contiguous axial images were obtained  from the base of the skull through the vertex without intravenous contrast. RADIATION DOSE REDUCTION: This exam was performed according to the departmental dose-optimization program which includes automated exposure control, adjustment of the mA and/or kV according to patient size and/or use of iterative reconstruction technique. COMPARISON:  CT head Feb 23, 2023. FINDINGS: Brain: Similar encephalomalacia in the right temporoparietal lobes with adjacent ex vacuo ventricular dilation. No evidence of acute large vascular territory infarct, acute hemorrhage, mass lesion, midline shift or hydrocephalus. Vascular: No hyperdense vessel. Skull: No acute fracture. Sinuses/Orbits: Clear sinuses.  No acute orbital findings. Other: No mastoid effusions. IMPRESSION: No evidence of acute intracranial abnormality. Electronically Signed   By: Feliberto Harts M.D.   On: 09/12/2023 11:44    Pertinent labs & imaging results that were available during my care of the patient were reviewed by me and considered in my medical decision making (see MDM for details).  Medications Ordered in ED Medications  levETIRAcetam (KEPPRA) IVPB 1500 mg/ 100 mL premix (0 mg Intravenous Stopped 09/12/23 1233)  ketorolac (TORADOL) 15 MG/ML injection 15 mg (15 mg Intravenous Given 09/12/23 1240)  lacosamide (VIMPAT) 50 mg in sodium chloride 0.9 % 25 mL IVPB (50 mg Intravenous New Bag/Given 09/12/23 1500)                                                                                                                                     Procedures Procedures  (including critical care time)  Medical Decision Making / ED Course   This patient presents to the ED for concern of seizures, this involves an extensive number of treatment options, and is a complaint that carries with it a high risk of complications and morbidity.  The differential diagnosis includes medication noncompliance, meningitis, posterior reversible encephalopathy syndrome,  hyponatremia, convulsive syncope, focal lesion/mass, head trauma, intracerebral hemorrhage, toxins/recreational drugs, pseudoseizure  MDM: Patient seen emergency room for evaluation of a seizure.  Physical exam with tenderness over the left hip, left thigh but no tongue bite.  Patient postictal on arrival.  Laboratory evaluation unremarkable outside of a mild hyponatremia to 134.  CT head unremarkable.  X-ray hip and femur unremarkable.  Spoke with the patient's primary neurologist Dr. Teresa Coombs who is recommending adding on Vimpat and he we will arrange outpatient follow-up.  Patient given first dose of Vimpat and loaded with Keppra here in the emergency department but at this time does not meet inpatient criteria for admission.  Will be discharged with outpatient follow-up.  Return precautions given of which he and his nephew voiced understanding   Additional history obtained: -Additional history obtained from nephew -External records from outside source obtained and reviewed including: Chart review including previous notes, labs, imaging, consultation notes   Lab Tests: -I ordered, reviewed, and interpreted labs.   The pertinent results include:   Labs Reviewed  COMPREHENSIVE METABOLIC PANEL - Abnormal; Notable for the following components:      Result Value   Sodium 134 (*)    Glucose, Bld 105 (*)    Alkaline Phosphatase 158 (*)    All other components within normal limits  CBC WITH DIFFERENTIAL/PLATELET  URINALYSIS, ROUTINE W REFLEX MICROSCOPIC  RAPID URINE DRUG SCREEN, HOSP PERFORMED      Imaging Studies ordered: I ordered imaging studies including CT head, x-ray femur and hip I independently visualized and interpreted imaging. I agree with the radiologist interpretation   Medicines ordered and prescription drug management: Meds ordered this encounter  Medications   levETIRAcetam (KEPPRA) IVPB 1500 mg/ 100 mL premix   ketorolac (TORADOL) 15 MG/ML injection 15 mg   lacosamide  (VIMPAT) 50 mg in sodium chloride 0.9 % 25 mL IVPB   Lacosamide 100 MG TABS    Sig: Take 0.5 tablets (50 mg total) by mouth in the morning and at bedtime for 14 days, THEN 1 tablet (100 mg total) in the morning and at bedtime for 28 days.    Dispense:  70 tablet    Refill:  0    -I have reviewed the patients home medicines and have made adjustments as needed  Critical interventions none  Consultations Obtained: I requested consultation with the neurologist Dr. Teresa Coombs,  and discussed lab and imaging findings as well as pertinent plan - they recommend: Vimpat and outpatient follow-up   Cardiac Monitoring: The patient was maintained on a cardiac monitor.  I personally viewed and interpreted the cardiac monitored which showed an underlying rhythm of: NSR  Social Determinants of Health:  Factors impacting patients care include: none   Reevaluation: After the interventions noted above, I reevaluated the patient and found that they have :improved  Co morbidities that complicate the patient evaluation  Past Medical History:  Diagnosis Date   Alcoholism (HCC)    History of, not active   Chronic hepatitis C (HCC)    Dementia (HCC)    Diverticulosis    Gait disorder    Gingival hypertrophy    Secondary to Dilantin   Hemiparesis and alteration of sensations as late effects of stroke (HCC) 01/25/2017   Left hemiparesis   Hiatal hernia    History of alcoholism (HCC)    Hx of ischemic vertebrobasilar artery thalamic stroke    Right   Internal hemorrhoids    Memory disorder 03/04/2013   Primary biliary cirrhosis (HCC)    Seizures (HCC)    Stroke (HCC)    Right frontal, right thalamic   Tubular adenoma of colon    Ulnar neuropathy of left upper extremity       Dispostion: I considered admission for this patient, but at this time he does not meet inpatient criteria for admission and will be discharged with outpatient neurology  follow-up     Final Clinical Impression(s) / ED  Diagnoses Final diagnoses:  Seizure [facility])     @PCDICTATION @    Glendora Score, MD 09/12/23 [phone]

## 2023-09-12 NOTE — ED Triage Notes (Addendum)
Pt coming from home. Coming in today for a tonic clonic seizure, family reports it lasted apprx 10 mins. Pt is post ictal, hx of dementia, but appears to be returning to his baseline. Pt did have a fall during seizure, no trauma noted, or blood thinner use. Pt endorses pain in left leg, above knee.

## 2023-09-12 NOTE — Telephone Encounter (Signed)
I spoke to the ED note. We will add another medication, Vimpat for seizure. Thanks

## 2023-09-22 ENCOUNTER — Other Ambulatory Visit: Payer: Self-pay | Admitting: Internal Medicine

## 2023-09-22 DIAGNOSIS — K219 Gastro-esophageal reflux disease without esophagitis: Secondary | ICD-10-CM

## 2023-09-24 ENCOUNTER — Telehealth: Payer: Self-pay | Admitting: Internal Medicine

## 2023-09-24 NOTE — Telephone Encounter (Addendum)
[name] pt's nephew calling to let the dr know Ivar Drape had another seizure early this morning.  He is OK now, they just wanted the dr to know.  [name] states he has no need to speak w/ the nurse.

## 2023-09-25 NOTE — Telephone Encounter (Signed)
Thanks

## 2023-09-25 NOTE — Telephone Encounter (Signed)
Can we add him to the cancellation. I know he has an appointment for Jan 31. May be we can see him sooner

## 2023-09-25 NOTE — Telephone Encounter (Signed)
Noted  

## 2023-10-21 ENCOUNTER — Ambulatory Visit: Payer: Medicare HMO | Admitting: Podiatry

## 2023-11-14 ENCOUNTER — Other Ambulatory Visit: Payer: Self-pay | Admitting: Internal Medicine

## 2023-11-14 DIAGNOSIS — R252 Cramp and spasm: Secondary | ICD-10-CM

## 2023-11-22 ENCOUNTER — Ambulatory Visit (INDEPENDENT_AMBULATORY_CARE_PROVIDER_SITE_OTHER): Payer: Medicare HMO | Admitting: Neurology

## 2023-11-22 ENCOUNTER — Encounter: Payer: Self-pay | Admitting: Neurology

## 2023-11-22 VITALS — BP 118/65 | HR 87 | Ht 69.0 in | Wt 174.0 lb

## 2023-11-22 DIAGNOSIS — G40009 Localization-related (focal) (partial) idiopathic epilepsy and epileptic syndromes with seizures of localized onset, not intractable, without status epilepticus: Secondary | ICD-10-CM

## 2023-11-22 DIAGNOSIS — F03B18 Unspecified dementia, moderate, with other behavioral disturbance: Secondary | ICD-10-CM | POA: Diagnosis not present

## 2023-11-22 DIAGNOSIS — R269 Unspecified abnormalities of gait and mobility: Secondary | ICD-10-CM | POA: Diagnosis not present

## 2023-11-22 MED ORDER — LACOSAMIDE 100 MG PO TABS: 100.0000 mg | ORAL_TABLET | Freq: Two times a day (BID) | ORAL | 5 refills | Status: DC

## 2023-11-22 NOTE — Progress Notes (Signed)
PATIENT: [name] DOB: 1951-05-18  REASON FOR VISIT: follow up HISTORY FROM: patient  HISTORY OF PRESENT ILLNESS: Today 11/22/23 Patient presents today for follow-up, he is accompanied by transport but nephew was available on the phone.  Last visit was in November, at that time plan was to continue his current antiseizure medications and Namenda for his dementia.  He did however had a breakthrough seizure, was taken to the hospital and Vimpat was added since the addition of Vimpat he has been doing well denies any seizure or seizure-like activity.  He is currently on Vimpat, Dilantin and Keppra tolerating the combination very well.  They deny any fall, stated his memory is poor but this is baseline.  No other complaint or concerns.   INTERVAL HISTORY 09/10/2023 Patient presents today for follow-up, he is accompanied by nephew. Last visit was in January 2023.  At that time, plan was to continue his levetiracetam and phenytoin and also Namenda for his dementia.  He comes today with his nephew after a breakthrough seizure on November 10.  Nephew tells me at that time he was compliant with his medication, his antiseizure medication level were taken at the hospital and were normal.  They deny any provoking factors such as lack of sleep, no infection and did not miss his medication.  Since then, he has been doing well, he has not had any additional event.   In terms of his memory, it is getting worse.  He lives at home with his nephew who takes care of him.  He is able to bathe himself, dress himself and feed himself otherwise needs assistance in all other ADLs.  INTERVAL HISTORY 11/21/2021 Patient presents today for follow-up, he is accompanied by his sister who lives with patient and his niece.  He has been doing well since last visit, no seizures.  Actually, the last seizure was more than 2 years ago.  He is compliant with his medications.  No recent falls.  Sister reports issue with his  dementia, he usually wakes up early in the morning and start wandering, at times he has walk outside the house required sister to call 911 to bring him back.  He is able to bathe himself, clean himself, feed himself but other than that he depends on sister.  No other complaint.  Last phenytoin level was 13.3.   Update 01/23/2021 SS: [name] is a 73 year old male with history of seizures and memory disorder, left hemiparesis.  On Dilantin and Keppra.  He had left-sided Bell's palsy in November 2021.  No recent seizures.  Left-sided Bell's palsy has resolved.  Can now completely close the left eye, with smiling very mild left eye closure, unsure if baseline or not.  Continues to live with his sister, at times does not want to take a shower.  He does well to take his medications, his sister manages these.  No falls, uses a walker.  They have no complaints today.  He is here today for follow-up accompanied by his sister, Verlon Au.  Update 08/23/2020 SS:[name] is a 73 year old male history of seizures and memory disorder.  He is on Dilantin and Keppra.  He lives with his sister, uses a walker.  Presented to the ER 07/25/20 with left ptosis, left-sided asymmetric smile, inability to raise his left forehead.  MRI of the brain was negative for acute findings, diagnosed with left-sided Bell's palsy, was treated with Valtrex and prednisone, given onset of Bell's palsy within 24 hours.  Was unable to fully close his left eye.  CMP showed mildly elevated alk phos 137, glucose 106.  He is about 75% better, able to close the eye about 85%, still left-sided asymmetric smile, left eye closure weakness.  No drooling. Has been taping his eye at night.  No seizures reported, tolerating medications.  No falls.  No history of DM.  Presents today for evaluation accompanied by his sister.  HISTORY 02/18/2020 SS: [name] is a 73 year old male with history of seizures and memory disorder.  He was started on Dilantin around April  2020 after reported seizures (frequent staring spells every 2 weeks, 1 episode lying down, his whole body drew up).  He has been established at a good maintenance dose of Dilantin 225 mg daily, along with Keppra 1500 mg daily.  Since last seen, his sister reports 1-2 episodes of brief staring episodes, unsure exactly if these represent true seizures.  He lives with his sister, Verlon Au.  She manages his medications and helps with his care.  He has a left hemiparesis.  He uses a walker.  He has seen GI for elevated alkaline phosphatase, in the setting of a positive antimitochondrial antibody and anti-smooth muscle antibody, undergoing evaluation. Is overall doing well with medications, is overall pleased, no recent falls.   REVIEW OF SYSTEMS: Out of a complete 14 system review of symptoms, the patient complains only of the following symptoms, and all other reviewed systems are negative.  Seizure  ALLERGIES: No Known Allergies  HOME MEDICATIONS: Outpatient Medications Prior to Visit  Medication Sig Dispense Refill   aspirin EC 81 MG tablet Take 1 tablet (81 mg total) by mouth daily. 100 tablet 0   levETIRAcetam (KEPPRA) 750 MG tablet Take 2 tablets (1,500 mg total) by mouth 2 (two) times daily. 360 tablet 1   memantine (NAMENDA) 10 MG tablet TAKE 1 TABLET BY MOUTH TWICE A DAY 180 tablet 0   pantoprazole (PROTONIX) 40 MG tablet TAKE 1 TABLET BY MOUTH EVERY DAY 90 tablet 0   phenytoin (DILANTIN) 100 MG ER capsule Take 2 capsules (200 mg total) by mouth at bedtime. FOR SEIZURES 180 capsule 1   PHENYTOIN INFATABS 50 MG tablet Chew 0.5 tablets (25 mg total) by mouth at bedtime. 45 tablet 1   rosuvastatin (CRESTOR) 10 MG tablet Take 1 tablet (10 mg total) by mouth daily. STOP Simvastatin 90 tablet 3   Lacosamide 100 MG TABS Take 0.5 tablets (50 mg total) by mouth in the morning and at bedtime for 14 days, THEN 1 tablet (100 mg total) in the morning and at bedtime for 28 days. 70 tablet 0   No  facility-administered medications prior to visit.    PAST MEDICAL HISTORY: Past Medical History:  Diagnosis Date   Alcoholism (HCC)    History of, not active   Chronic hepatitis C (HCC)    Dementia (HCC)    Diverticulosis    Gait disorder    Gingival hypertrophy    Secondary to Dilantin   Hemiparesis and alteration of sensations as late effects of stroke (HCC) 01/25/2017   Left hemiparesis   Hiatal hernia    History of alcoholism (HCC)    Hx of ischemic vertebrobasilar artery thalamic stroke    Right   Internal hemorrhoids    Memory disorder 03/04/2013   Primary biliary cirrhosis (HCC)    Seizures (HCC)    Stroke (HCC)    Right frontal, right thalamic   Tubular adenoma of colon    Ulnar  neuropathy of left upper extremity     PAST SURGICAL HISTORY: Past Surgical History:  Procedure Laterality Date   COLONOSCOPY WITH PROPOFOL N/A 07/10/2017   Procedure: COLONOSCOPY WITH PROPOFOL;  Surgeon: Kathi Der, MD;  Location: [facility];  Service: Gastroenterology;  Laterality: N/A;   ESOPHAGOGASTRODUODENOSCOPY (EGD) WITH PROPOFOL N/A 07/10/2017   Procedure: ESOPHAGOGASTRODUODENOSCOPY (EGD) WITH PROPOFOL;  Surgeon: Kathi Der, MD;  Location: [facility];  Service: Gastroenterology;  Laterality: N/A;   HIP ARTHROPLASTY Left 11/26/2013   Procedure: LEFT HIP HEMIARTHROPLASTY;  Surgeon: Kathryne Hitch, MD;  Location: [facility] OR;  Service: Orthopedics;  Laterality: Left;   WRIST SURGERY Left 95 & 96    FAMILY HISTORY: Family History  Problem Relation Age of Onset   Pulmonary embolism Mother    Liver disease Father    Diabetes Sister    Hypertension Sister    Hypertension Sister    Hypertension Sister    Seizures Neg Hx     SOCIAL HISTORY: Social History   Socioeconomic History   Marital status: Divorced    Spouse name: Not on file   Number of children: 0   Years of education: 10   Highest education level: Not on file  Occupational History   Not on file   Tobacco Use   Smoking status: Former    Current packs/day: 0.00    Types: Cigarettes    Quit date: 01/02/2014    Years since quitting: 9.8   Smokeless tobacco: Former  Building services engineer status: Never Used  Substance and Sexual Activity   Alcohol use: No    Alcohol/week: 0.0 standard drinks of alcohol    Comment: Former Alcoholic   Drug use: No   Sexual activity: Yes  Other Topics Concern   Not on file  Social History Narrative   Patient is single and lives with a friend and her son.   Patient does not have any children.   Patient is disabled.   Patient has a 10 th grade education.   Patient is left-handed.   Patient drinks some soda but not everyday.   Social Drivers of Corporate investment banker Strain: Low Risk  (06/25/2023)   Overall Financial Resource Strain (CARDIA)    Difficulty of Paying Living Expenses: Not hard at all  Food Insecurity: No Food Insecurity (06/25/2023)   Hunger Vital Sign    Worried About Running Out of Food in the Last Year: Never true    Ran Out of Food in the Last Year: Never true  Transportation Needs: No Transportation Needs (06/25/2023)   PRAPARE - Administrator, Civil Service (Medical): No    Lack of Transportation (Non-Medical): No  Physical Activity: Inactive (06/25/2023)   Exercise Vital Sign    Days of Exercise per Week: 0 days    Minutes of Exercise per Session: 0 min  Stress: No Stress Concern Present (06/25/2023)   [employer] of Occupational Health - Occupational Stress Questionnaire    Feeling of Stress : Not at all  Social Connections: Socially Isolated (06/25/2023)   Social Connection and Isolation Panel [NHANES]    Frequency of Communication with Friends and Family: More than three times a week    Frequency of Social Gatherings with Friends and Family: Three times a week    Attends Religious Services: Never    Active Member of Clubs or Organizations: No    Attends Banker Meetings: Never    Marital  Status: Divorced  Intimate  Partner Violence: Not At Risk (06/25/2023)   Humiliation, Afraid, Rape, and Kick questionnaire    Fear of Current or Ex-Partner: No    Emotionally Abused: No    Physically Abused: No    Sexually Abused: No    PHYSICAL EXAM  Vitals:   11/22/23 0946  BP: 118/65  Pulse: 87  Weight: 174 lb (78.9 kg)  Height: 5\' 9"  (1.753 m)   Body mass index is 25.7 kg/m.  Generalized: Well developed, in no acute distress     09/10/2023    9:29 AM 11/21/2021   10:02 AM 06/29/2021    3:41 PM  MMSE - Mini Mental State Exam  Not completed:  Unable to complete   Orientation to time 0  2  Orientation to Place 4  2  Registration 3  2  Attention/ Calculation 0  3  Recall 0  0  Language- name 2 objects 2  2  Language- repeat 0  1  Language- follow 3 step command 1  3  Language- read & follow direction 0  0  Write a sentence 0  0  Copy design 0  0  Total score 10  15    Neurological examination  Mentation: Alert, limited verbal response, most history is provided by his nephew, follows most exam commands well, is well-appearing and groomed Cranial nerve II-XII:  Extraocular movements were full, visual field were full on confrontational test.  Motor: Good strength of all extremities, mild spasticity left upper extremity Sensory: Normal to right and left side Coordination: Mild dysmetria on the left finger-nose-finger Gait and station: Rises from seated position with pushoff, ambulates with a cane, is wide-based, limp on the left Reflexes: Deep tendon reflexes are symmetric but decreased throughout   DIAGNOSTIC DATA (LABS, IMAGING, TESTING) - I reviewed patient records, labs, notes, testing and imaging myself where available.  Lab Results  Component Value Date   WBC 6.4 09/12/2023   HGB 14.2 09/12/2023   HCT 40.1 09/12/2023   MCV 94.6 09/12/2023   PLT 166 09/12/2023      Component Value Date/Time   NA 134 (L) 09/12/2023 1147   NA 143 08/20/2023 1539   K 4.4  09/12/2023 1147   CL 103 09/12/2023 1147   CO2 26 09/12/2023 1147   GLUCOSE 105 (H) 09/12/2023 1147   BUN 17 09/12/2023 1147   BUN 14 08/20/2023 1539   CREATININE 0.89 09/12/2023 1147   CREATININE 0.93 04/08/2017 1642   CALCIUM 8.9 09/12/2023 1147   PROT 7.6 09/12/2023 1147   PROT 7.4 08/20/2023 1539   ALBUMIN 4.1 09/12/2023 1147   ALBUMIN 4.4 08/20/2023 1539   AST 20 09/12/2023 1147   ALT 29 09/12/2023 1147   ALKPHOS 158 (H) 09/12/2023 1147   BILITOT 0.6 09/12/2023 1147   BILITOT 0.2 08/20/2023 1539   GFRNONAA >60 09/12/2023 1147   GFRNONAA 86 04/08/2017 1642   GFRAA >60 07/25/2020 2138   GFRAA >89 04/08/2017 1642   Lab Results  Component Value Date   CHOL 122 08/20/2023   HDL 32 (L) 08/20/2023   LDLCALC 58 08/20/2023   TRIG 196 (H) 08/20/2023   CHOLHDL 3.8 08/20/2023   Lab Results  Component Value Date   HGBA1C 5.3 05/23/2016   No results found for: "VITAMINB12" Lab Results  Component Value Date   TSH 1.270 10/26/2021   ASSESSMENT AND PLAN 73 y.o. year old male  has a past medical history of Alcoholism (HCC), Chronic hepatitis C (HCC), Dementia (HCC), Diverticulosis, Gait  disorder, Gingival hypertrophy, Hemiparesis and alteration of sensations as late effects of stroke (HCC) (01/25/2017), Hiatal hernia, History of alcoholism (HCC), ischemic vertebrobasilar artery thalamic stroke, Internal hemorrhoids, Memory disorder (03/04/2013), Primary biliary cirrhosis (HCC), Seizures (HCC), Stroke (HCC), Tubular adenoma of colon, and Ulnar neuropathy of left upper extremity. here with:  1.  Seizure disorder 2.  Cerebrovascular disease, left hemiparesis 3.  Left-sided Bell's palsy (resolved) 4.  Gait disorder 5.  Moderate to Severe Dementia   -Continue Keppra 750 mg tablet, 2 tablets twice a day -Continue Dilantin total of 225 mg at bedtime  -Continue with Vimpat 100 mg twice daily, refill given  -Continue with Namenda  -Check routine labs today including Vitamin D  level -Follow-up in 1 year or sooner if needed, call for seizure activity   Windell Norfolk, MD 11/22/2023, 5:51 PM [facility] [address] [place], [place] [address] [phone]

## 2023-11-24 ENCOUNTER — Other Ambulatory Visit: Payer: Self-pay | Admitting: Internal Medicine

## 2023-11-24 ENCOUNTER — Other Ambulatory Visit: Payer: Self-pay | Admitting: Physician Assistant

## 2023-11-24 DIAGNOSIS — K219 Gastro-esophageal reflux disease without esophagitis: Secondary | ICD-10-CM

## 2023-11-24 DIAGNOSIS — E785 Hyperlipidemia, unspecified: Secondary | ICD-10-CM

## 2023-11-26 ENCOUNTER — Other Ambulatory Visit: Payer: Self-pay | Admitting: Internal Medicine

## 2023-11-26 ENCOUNTER — Ambulatory Visit: Payer: Medicare HMO | Admitting: Internal Medicine

## 2023-11-26 DIAGNOSIS — K219 Gastro-esophageal reflux disease without esophagitis: Secondary | ICD-10-CM

## 2023-11-26 DIAGNOSIS — R569 Unspecified convulsions: Secondary | ICD-10-CM

## 2023-11-26 DIAGNOSIS — I69359 Hemiplegia and hemiparesis following cerebral infarction affecting unspecified side: Secondary | ICD-10-CM

## 2023-11-26 NOTE — Telephone Encounter (Signed)
 Last Fill: Crestor : 08/20/23     Phenytoin : 07/24/23     Dilantin : 07/24/23     Protonix : 09/24/23     Namenda : 09/24/23     Keppra : 02/25/23     Lacosamide : 11/22/23  Last OV: 09/02/23 Next OV: 12/24/23  Routing to provider for review/authorization.

## 2023-11-26 NOTE — Telephone Encounter (Signed)
 Copied from CRM [id number]. Topic: Clinical - Medication Refill >> Nov 26, 2023 10:28 AM Curlee DEL wrote: Most Recent Primary Care Visit:  Provider: THEOTIS OHM W  Department: [facility]  Visit Type: SAME DAY  Date: 09/02/2023  Medication:   levETIRAcetam  (KEPPRA ) 750 MG tablet memantine  (NAMENDA ) 10 MG tablet pantoprazole  (PROTONIX ) 40 MG tablet phenytoin  (DILANTIN ) 100 MG ER capsule PHENYTOIN  INFATABS 50 MG tablet  rosuvastatin  (CRESTOR ) 10 MG tablet   Has the patient contacted their pharmacy? Yes, advise to contact us  for refills. (Agent: If no, request that the patient contact the pharmacy for the refill. If patient does not wish to contact the pharmacy document the reason why and proceed with request.) (Agent: If yes, when and what did the pharmacy advise?)  Is this the correct pharmacy for this prescription? Yes If no, delete pharmacy and type the correct one.  This is the patient's preferred pharmacy:  [facility] [phone] [address], [place] - [address] [place] [address] Phone: [phone] Fax: [phone]   Has the prescription been filled recently? No  Is the patient out of the medication? Yes  Has the patient been seen for an appointment in the last year OR does the patient have an upcoming appointment? Yes  Can we respond through MyChart? No  Agent: Please be advised that Rx refills may take up to 3 business days. We ask that you follow-up with your pharmacy.

## 2023-11-27 MED ORDER — PHENYTOIN SODIUM EXTENDED 100 MG PO CAPS: 200.0000 mg | ORAL_CAPSULE | Freq: Every day | ORAL | 1 refills | Status: DC

## 2023-11-27 MED ORDER — MEMANTINE HCL 10 MG PO TABS: 10.0000 mg | ORAL_TABLET | Freq: Two times a day (BID) | ORAL | 0 refills | Status: DC

## 2023-11-27 MED ORDER — PHENYTOIN INFATABS 50 MG PO CHEW: 25.0000 mg | CHEWABLE_TABLET | Freq: Every day | ORAL | 1 refills | Status: DC

## 2023-11-27 MED ORDER — PANTOPRAZOLE SODIUM 40 MG PO TBEC: 40.0000 mg | DELAYED_RELEASE_TABLET | Freq: Every day | ORAL | 0 refills | Status: DC

## 2023-11-27 MED ORDER — LEVETIRACETAM 750 MG PO TABS: 1500.0000 mg | ORAL_TABLET | Freq: Two times a day (BID) | ORAL | 1 refills | Status: DC

## 2023-11-27 MED ORDER — ROSUVASTATIN CALCIUM 10 MG PO TABS: 10.0000 mg | ORAL_TABLET | Freq: Every day | ORAL | 3 refills | Status: DC

## 2023-12-24 ENCOUNTER — Ambulatory Visit: Payer: 59 | Admitting: Internal Medicine

## 2023-12-29 ENCOUNTER — Emergency Department (HOSPITAL_COMMUNITY)

## 2023-12-29 ENCOUNTER — Other Ambulatory Visit: Payer: Self-pay

## 2023-12-29 ENCOUNTER — Encounter (HOSPITAL_COMMUNITY): Payer: Self-pay

## 2023-12-29 ENCOUNTER — Observation Stay (HOSPITAL_COMMUNITY)
Admission: EM | Admit: 2023-12-29 | Discharge: 2023-12-30 | Disposition: A | Discharge status: Home / Self Care | Attending: Internal Medicine | Admitting: Internal Medicine

## 2023-12-29 DIAGNOSIS — G40909 Epilepsy, unspecified, not intractable, without status epilepticus: Secondary | ICD-10-CM

## 2023-12-29 DIAGNOSIS — J111 Influenza due to unidentified influenza virus with other respiratory manifestations: Secondary | ICD-10-CM

## 2023-12-29 DIAGNOSIS — D696 Thrombocytopenia, unspecified: Secondary | ICD-10-CM | POA: Diagnosis present

## 2023-12-29 DIAGNOSIS — G9341 Metabolic encephalopathy: Secondary | ICD-10-CM | POA: Diagnosis present

## 2023-12-29 DIAGNOSIS — R160 Hepatomegaly, not elsewhere classified: Secondary | ICD-10-CM | POA: Diagnosis present

## 2023-12-29 DIAGNOSIS — J101 Influenza due to other identified influenza virus with other respiratory manifestations: Secondary | ICD-10-CM | POA: Diagnosis present

## 2023-12-29 DIAGNOSIS — R4182 Altered mental status, unspecified: Principal | ICD-10-CM

## 2023-12-29 DIAGNOSIS — F0390 Unspecified dementia without behavioral disturbance: Secondary | ICD-10-CM | POA: Diagnosis present

## 2023-12-29 DIAGNOSIS — Z8673 Personal history of transient ischemic attack (TIA), and cerebral infarction without residual deficits: Secondary | ICD-10-CM | POA: Insufficient documentation

## 2023-12-29 DIAGNOSIS — Z7982 Long term (current) use of aspirin: Secondary | ICD-10-CM | POA: Insufficient documentation

## 2023-12-29 DIAGNOSIS — R918 Other nonspecific abnormal finding of lung field: Secondary | ICD-10-CM | POA: Diagnosis not present

## 2023-12-29 DIAGNOSIS — R059 Cough, unspecified: Secondary | ICD-10-CM | POA: Diagnosis not present

## 2023-12-29 DIAGNOSIS — R109 Unspecified abdominal pain: Secondary | ICD-10-CM | POA: Diagnosis not present

## 2023-12-29 DIAGNOSIS — R404 Transient alteration of awareness: Secondary | ICD-10-CM | POA: Diagnosis not present

## 2023-12-29 DIAGNOSIS — R2681 Unsteadiness on feet: Secondary | ICD-10-CM | POA: Insufficient documentation

## 2023-12-29 DIAGNOSIS — G9389 Other specified disorders of brain: Secondary | ICD-10-CM | POA: Diagnosis not present

## 2023-12-29 DIAGNOSIS — R932 Abnormal findings on diagnostic imaging of liver and biliary tract: Secondary | ICD-10-CM | POA: Diagnosis not present

## 2023-12-29 DIAGNOSIS — R569 Unspecified convulsions: Secondary | ICD-10-CM | POA: Diagnosis not present

## 2023-12-29 DIAGNOSIS — R9389 Abnormal findings on diagnostic imaging of other specified body structures: Secondary | ICD-10-CM | POA: Diagnosis not present

## 2023-12-29 DIAGNOSIS — I499 Cardiac arrhythmia, unspecified: Secondary | ICD-10-CM | POA: Diagnosis not present

## 2023-12-29 DIAGNOSIS — R0902 Hypoxemia: Secondary | ICD-10-CM | POA: Diagnosis not present

## 2023-12-29 DIAGNOSIS — K769 Liver disease, unspecified: Secondary | ICD-10-CM | POA: Diagnosis not present

## 2023-12-29 DIAGNOSIS — J439 Emphysema, unspecified: Secondary | ICD-10-CM | POA: Diagnosis not present

## 2023-12-29 HISTORY — DX: Unspecified convulsions: R56.9

## 2023-12-29 LAB — COMPREHENSIVE METABOLIC PANEL
ALT: 22 U/L (ref 0–44)
AST: 28 U/L (ref 15–41)
Albumin: 3.8 g/dL (ref 3.5–5.0)
Alkaline Phosphatase: 136 U/L — ABNORMAL HIGH (ref 38–126)
Anion gap: 9 (ref 5–15)
BUN: 21 mg/dL (ref 8–23)
CO2: 24 mmol/L (ref 22–32)
Calcium: 8.7 mg/dL — ABNORMAL LOW (ref 8.9–10.3)
Chloride: 100 mmol/L (ref 98–111)
Creatinine, Ser: 1.28 mg/dL — ABNORMAL HIGH (ref 0.61–1.24)
GFR, Estimated: 60 mL/min — ABNORMAL LOW (ref >60)
Glucose, Bld: 122 mg/dL — ABNORMAL HIGH (ref 70–99)
Potassium: 4.3 mmol/L (ref 3.5–5.1)
Sodium: 133 mmol/L — ABNORMAL LOW (ref 135–145)
Total Bilirubin: 0.5 mg/dL (ref 0.0–1.2)
Total Protein: 7.7 g/dL (ref 6.5–8.1)

## 2023-12-29 LAB — RESP PANEL BY RT-PCR (RSV, FLU A&B, COVID)  RVPGX2
Influenza A by PCR: POSITIVE — AB
Influenza B by PCR: NEGATIVE
Resp Syncytial Virus by PCR: NEGATIVE
SARS Coronavirus 2 by RT PCR: NEGATIVE

## 2023-12-29 LAB — CBC WITH DIFFERENTIAL/PLATELET
Abs Immature Granulocytes: 0.03 10*3/uL (ref 0.00–0.07)
Basophils Absolute: 0 10*3/uL (ref 0.0–0.1)
Basophils Relative: 0 %
Eosinophils Absolute: 0 10*3/uL (ref 0.0–0.5)
Eosinophils Relative: 0 %
HCT: 42.1 % (ref 39.0–52.0)
Hemoglobin: 13.9 g/dL (ref 13.0–17.0)
Immature Granulocytes: 0 %
Lymphocytes Relative: 7 %
Lymphs Abs: 0.6 10*3/uL — ABNORMAL LOW (ref 0.7–4.0)
MCH: 31.8 pg (ref 26.0–34.0)
MCHC: 33 g/dL (ref 30.0–36.0)
MCV: 96.3 fL (ref 80.0–100.0)
Monocytes Absolute: 1.1 10*3/uL — ABNORMAL HIGH (ref 0.1–1.0)
Monocytes Relative: 13 %
Neutro Abs: 6.3 10*3/uL (ref 1.7–7.7)
Neutrophils Relative %: 80 %
Platelets: 142 10*3/uL — ABNORMAL LOW (ref 150–400)
RBC: 4.37 MIL/uL (ref 4.22–5.81)
RDW: 12.1 % (ref 11.5–15.5)
WBC: 7.9 10*3/uL (ref 4.0–10.5)
nRBC: 0 % (ref 0.0–0.2)

## 2023-12-29 LAB — URINALYSIS, W/ REFLEX TO CULTURE (INFECTION SUSPECTED)
Bacteria, UA: NONE SEEN
Bilirubin Urine: NEGATIVE
Glucose, UA: NEGATIVE mg/dL
Ketones, ur: NEGATIVE mg/dL
Leukocytes,Ua: NEGATIVE
Nitrite: NEGATIVE
Protein, ur: NEGATIVE mg/dL
Specific Gravity, Urine: 1.036 — ABNORMAL HIGH (ref 1.005–1.030)
pH: 5 (ref 5.0–8.0)

## 2023-12-29 LAB — I-STAT CG4 LACTIC ACID, ED: Lactic Acid, Venous: 0.8 mmol/L (ref 0.5–1.9)

## 2023-12-29 LAB — RAPID URINE DRUG SCREEN, HOSP PERFORMED
Amphetamines: NOT DETECTED
Barbiturates: NOT DETECTED
Benzodiazepines: NOT DETECTED
Cocaine: NOT DETECTED
Opiates: NOT DETECTED
Tetrahydrocannabinol: NOT DETECTED

## 2023-12-29 LAB — ETHANOL: Alcohol, Ethyl (B): <10 mg/dL (ref <10)

## 2023-12-29 LAB — GLUCOSE, CAPILLARY: Glucose-Capillary: 176 mg/dL — ABNORMAL HIGH (ref 70–99)

## 2023-12-29 MED ORDER — ACETAMINOPHEN 650 MG RE SUPP: 650.0000 mg | Freq: Four times a day (QID) | RECTAL | Status: DC | PRN

## 2023-12-29 MED ORDER — SODIUM CHLORIDE 0.9 % IV BOLUS
1000.0000 mL | Freq: Once | INTRAVENOUS | Status: AC
  Administered 2023-12-29: 1000 mL via INTRAVENOUS

## 2023-12-29 MED ORDER — ONDANSETRON HCL 4 MG PO TABS: 4.0000 mg | ORAL_TABLET | Freq: Four times a day (QID) | ORAL | Status: DC | PRN

## 2023-12-29 MED ORDER — PANTOPRAZOLE SODIUM 40 MG PO TBEC
40.0000 mg | DELAYED_RELEASE_TABLET | Freq: Every day | ORAL | Status: DC
  Administered 2023-12-29 – 2023-12-30 (×2): 40 mg via ORAL
  Filled 2023-12-29 (×2): qty 1

## 2023-12-29 MED ORDER — METHYLPREDNISOLONE SODIUM SUCC 40 MG IJ SOLR
40.0000 mg | Freq: Once | INTRAMUSCULAR | Status: AC
  Administered 2023-12-29: 40 mg via INTRAVENOUS
  Filled 2023-12-29: qty 1

## 2023-12-29 MED ORDER — PIPERACILLIN-TAZOBACTAM 3.375 G IVPB 30 MIN
3.3750 g | Freq: Once | INTRAVENOUS | Status: AC
  Administered 2023-12-29: 3.375 g via INTRAVENOUS
  Filled 2023-12-29: qty 50

## 2023-12-29 MED ORDER — IPRATROPIUM-ALBUTEROL 0.5-2.5 (3) MG/3ML IN SOLN
3.0000 mL | Freq: Two times a day (BID) | RESPIRATORY_TRACT | Status: DC
  Administered 2023-12-29 – 2023-12-30 (×2): 3 mL via RESPIRATORY_TRACT
  Filled 2023-12-29: qty 3

## 2023-12-29 MED ORDER — OSELTAMIVIR PHOSPHATE 75 MG PO CAPS
75.0000 mg | ORAL_CAPSULE | Freq: Two times a day (BID) | ORAL | Status: DC
  Administered 2023-12-29 – 2023-12-30 (×2): 75 mg via ORAL
  Filled 2023-12-29 (×2): qty 1

## 2023-12-29 MED ORDER — VANCOMYCIN HCL 1500 MG/300ML IV SOLN
1500.0000 mg | INTRAVENOUS | Status: AC
  Administered 2023-12-29: 1500 mg via INTRAVENOUS
  Filled 2023-12-29: qty 300

## 2023-12-29 MED ORDER — ROSUVASTATIN CALCIUM 10 MG PO TABS
10.0000 mg | ORAL_TABLET | Freq: Every day | ORAL | Status: DC
  Administered 2023-12-29 – 2023-12-30 (×2): 10 mg via ORAL
  Filled 2023-12-29 (×2): qty 1

## 2023-12-29 MED ORDER — PHENYTOIN SODIUM EXTENDED 100 MG PO CAPS
200.0000 mg | ORAL_CAPSULE | Freq: Every day | ORAL | Status: DC
  Administered 2023-12-29: 200 mg via ORAL
  Filled 2023-12-29: qty 2

## 2023-12-29 MED ORDER — SODIUM CHLORIDE 0.9 % IV SOLN
1.0000 g | INTRAVENOUS | Status: DC
  Administered 2023-12-29: 1 g via INTRAVENOUS
  Filled 2023-12-29: qty 10

## 2023-12-29 MED ORDER — ACETAMINOPHEN 650 MG RE SUPP
650.0000 mg | Freq: Once | RECTAL | Status: AC
  Administered 2023-12-29: 650 mg via RECTAL
  Filled 2023-12-29: qty 1

## 2023-12-29 MED ORDER — LEVETIRACETAM 500 MG PO TABS
1500.0000 mg | ORAL_TABLET | Freq: Two times a day (BID) | ORAL | Status: DC
  Administered 2023-12-29: 1500 mg via ORAL
  Filled 2023-12-29: qty 3

## 2023-12-29 MED ORDER — PHENYTOIN 50 MG PO CHEW
25.0000 mg | CHEWABLE_TABLET | Freq: Every day | ORAL | Status: DC
  Administered 2023-12-29: 25 mg via ORAL
  Filled 2023-12-29: qty 0.5

## 2023-12-29 MED ORDER — SODIUM CHLORIDE 0.9 % IV SOLN
500.0000 mg | INTRAVENOUS | Status: DC
  Administered 2023-12-29: 500 mg via INTRAVENOUS
  Filled 2023-12-29: qty 5

## 2023-12-29 MED ORDER — LEVETIRACETAM IN NACL 1500 MG/100ML IV SOLN
1500.0000 mg | Freq: Once | INTRAVENOUS | Status: AC
  Administered 2023-12-29: 1500 mg via INTRAVENOUS
  Filled 2023-12-29: qty 100

## 2023-12-29 MED ORDER — ONDANSETRON HCL 4 MG/2ML IJ SOLN: 4.0000 mg | Freq: Four times a day (QID) | INTRAMUSCULAR | Status: DC | PRN

## 2023-12-29 MED ORDER — DEXTROSE-SODIUM CHLORIDE 5-0.9 % IV SOLN: INTRAVENOUS | Status: AC

## 2023-12-29 MED ORDER — ACETAMINOPHEN 325 MG PO TABS
650.0000 mg | ORAL_TABLET | Freq: Four times a day (QID) | ORAL | Status: DC | PRN
  Administered 2023-12-29 – 2023-12-30 (×2): 650 mg via ORAL
  Filled 2023-12-29: qty 2

## 2023-12-29 MED ORDER — LACOSAMIDE 50 MG PO TABS
100.0000 mg | ORAL_TABLET | Freq: Two times a day (BID) | ORAL | Status: DC
  Administered 2023-12-29 – 2023-12-30 (×3): 100 mg via ORAL
  Filled 2023-12-29 (×3): qty 2

## 2023-12-29 MED ORDER — ENOXAPARIN SODIUM 40 MG/0.4ML IJ SOSY
40.0000 mg | PREFILLED_SYRINGE | INTRAMUSCULAR | Status: DC
  Administered 2023-12-29: 40 mg via SUBCUTANEOUS
  Filled 2023-12-29: qty 0.4

## 2023-12-29 MED ORDER — LEVETIRACETAM 500 MG PO TABS
1500.0000 mg | ORAL_TABLET | Freq: Two times a day (BID) | ORAL | Status: DC
  Administered 2023-12-30: 1500 mg via ORAL
  Filled 2023-12-29: qty 3

## 2023-12-29 MED ORDER — MAGNESIUM SULFATE 2 GM/50ML IV SOLN
2.0000 g | Freq: Once | INTRAVENOUS | Status: AC
  Administered 2023-12-29: 2 g via INTRAVENOUS
  Filled 2023-12-29: qty 50

## 2023-12-29 MED ORDER — ASPIRIN 81 MG PO TBEC
81.0000 mg | DELAYED_RELEASE_TABLET | Freq: Every day | ORAL | Status: DC
Start: 2023-12-29 — End: 2023-12-30
  Administered 2023-12-29 – 2023-12-30 (×2): 81 mg via ORAL
  Filled 2023-12-29 (×2): qty 1

## 2023-12-29 MED ORDER — IOHEXOL 350 MG/ML SOLN
100.0000 mL | Freq: Once | INTRAVENOUS | Status: AC | PRN
Start: 2023-12-29 — End: 2023-12-29
  Administered 2023-12-29: 100 mL via INTRAVENOUS

## 2023-12-29 MED ORDER — MEMANTINE HCL 10 MG PO TABS
10.0000 mg | ORAL_TABLET | Freq: Two times a day (BID) | ORAL | Status: DC
Start: 2023-12-29 — End: 2023-12-30
  Administered 2023-12-29 – 2023-12-30 (×3): 10 mg via ORAL
  Filled 2023-12-29: qty 2
  Filled 2023-12-29 (×2): qty 1

## 2023-12-29 NOTE — ED Notes (Signed)
 Changed pt bedding and provided pericare

## 2023-12-29 NOTE — ED Notes (Signed)
 Patient transported to CT

## 2023-12-29 NOTE — ED Triage Notes (Signed)
 BIB [facility]. Hx of seizures. Family found him having a seizure on the toilet. Still postictal when EMS arrived. Family says he has not missed any doses of seizure medications. He has had a cough this past week. BP 121/75 O2 94% HR 103 CBG 131. No seizures with EMS. Family reports pt usually has multiple seizures once he has one.

## 2023-12-29 NOTE — ED Provider Notes (Signed)
 [place] EMERGENCY DEPARTMENT AT [facility] Provider Note   CSN: [id number] Arrival date & time: 12/29/23  [id number]     History  Chief Complaint  Patient presents with   Seizures    [name] is a 73 y.o. male.  74 year old male with prior medical history as detailed below presents for evaluation.  Patient resides with family.  Family heard the patient get up early this morning go to the bathroom.  He appeared to have stayed longer in the bathroom than his normal.  Family went to the bathroom and found the patient slumped over semiresponsive on the toilet.  Occasionally the patient can have breakthrough seizures and his appearance was similar per the family.  However, after the family observe the patient for approximately 45 minutes to an hour his confusion did not improve.  Therefore they decided to come to the ED.  On arrival to the ED the patient is pleasantly confused.  He refers to his family member (who was present in the room) as "ugly" instead of producing the family members proper name.  He is unable to provide details of his recent illness.  The history is provided by the patient, a relative and medical records.       Home Medications Prior to Admission medications   Not on File      Allergies    Patient has no allergy information on record.    Review of Systems   Review of Systems  All other systems reviewed and are negative.   Physical Exam Updated Vital Signs There were no vitals taken for this visit. Physical Exam Vitals and nursing note reviewed.  Constitutional:      General: He is not in acute distress.    Appearance: Normal appearance. He is well-developed.  HENT:     Head: Normocephalic and atraumatic.  Eyes:     Conjunctiva/sclera: Conjunctivae normal.     Pupils: Pupils are equal, round, and reactive to light.  Cardiovascular:     Rate and Rhythm: Normal rate and regular rhythm.     Heart sounds: Normal heart sounds.   Pulmonary:     Effort: Pulmonary effort is normal. No respiratory distress.     Breath sounds: Normal breath sounds.  Abdominal:     General: There is no distension.     Palpations: Abdomen is soft.     Tenderness: There is no abdominal tenderness.  Musculoskeletal:        General: No deformity. Normal range of motion.     Cervical back: Normal range of motion and neck supple.  Skin:    General: Skin is warm and dry.  Neurological:     General: No focal deficit present.     Mental Status: He is alert.     Comments: Alert but disoriented.  He cannot recall details of how he arrived to the hospital.  He is speech is normal.  He does not have acute focal deficit.     ED Results / Procedures / Treatments   Labs (all labs ordered are listed, but only abnormal results are displayed) Labs Reviewed  RESP PANEL BY RT-PCR (RSV, FLU A&B, COVID)  RVPGX2 - Abnormal; Notable for the following components:      Result Value   Influenza A by PCR POSITIVE (*)    All other components within normal limits  COMPREHENSIVE METABOLIC PANEL - Abnormal; Notable for the following components:   Sodium 133 (*)    Glucose, Bld 122 (*)  Creatinine, Ser 1.28 (*)    Calcium 8.7 (*)    Alkaline Phosphatase 136 (*)    GFR, Estimated 60 (*)    All other components within normal limits  CBC WITH DIFFERENTIAL/PLATELET - Abnormal; Notable for the following components:   Platelets 142 (*)    Lymphs Abs 0.6 (*)    Monocytes Absolute 1.1 (*)    All other components within normal limits  CULTURE, BLOOD (ROUTINE X 2)  CULTURE, BLOOD (ROUTINE X 2)  ETHANOL  URINALYSIS, W/ REFLEX TO CULTURE (INFECTION SUSPECTED)  RAPID URINE DRUG SCREEN, HOSP PERFORMED  LEVETIRACETAM LEVEL  PHENYTOIN LEVEL, FREE AND TOTAL  I-STAT CG4 LACTIC ACID, ED    EKG None  Radiology No results found.  Procedures Procedures    Medications Ordered in ED Medications - No data to display  ED Course/ Medical Decision Making/  A&P                                 Medical Decision Making Amount and/or Complexity of Data Reviewed Labs: ordered. Radiology: ordered.  Risk OTC drugs. Prescription drug management. Decision regarding hospitalization.    Medical Screen Complete  This patient presented to the ED with complaint of AMS, possible seizure.  This complaint involves an extensive number of treatment options. The initial differential diagnosis includes, but is not limited to, seizure, prolonged postictal phase, infection, metabolic abnormality, encephalopathy, etc.  This presentation is: Acute, Chronic, Self-Limited, Previously Undiagnosed, Uncertain Prognosis, Complicated, Systemic Symptoms, and Threat to Life/Bodily Function  Patient presents from home after being found semiresponsive in his bathroom.  Family is concerned about possible seizure with postictal period. No witnessed seizure activity.  Patient noted to be febrile on arrival.  He is confused and mildly delirious.  No acute focal weakness appreciated.  Influenza testing is positive.  Initial chest x-ray obtained is concerning for possible infiltrate.  Broad-spectrum antibiotics administered.  Other screening labs and imaging are reassuring.  Patient without evidence of acute intracranial process.  CTA chest is without evidence of PE.  Patient would benefit from admission.  Hospitalist service is aware case will evaluate for same.  Co morbidities that complicated the patient's evaluation  See HPI   Additional history obtained: External records from outside sources obtained and reviewed including prior ED visits and prior Inpatient records.    Lab Tests:  I ordered and personally interpreted labs.  The pertinent results include: CBC, CMP, COVID, flu, phenytoin level, Keppra level, UA, lactic acid   Imaging Studies ordered:  I ordered imaging studies including chest x-ray, CT head, CTA chest, CT abdomen pelvis I independently  visualized and interpreted obtained imaging which showed possible infiltrates on chest x-ray I agree with the radiologist interpretation.   Cardiac Monitoring:  The patient was maintained on a cardiac monitor.  I personally viewed and interpreted the cardiac monitor which showed an underlying rhythm of: NSR   Problem List / ED Course:  AMS, fever, influenza   Reevaluation:  After the interventions noted above, I reevaluated the patient and found that they have: improved  Disposition:  After consideration of the diagnostic results and the patients response to treatment, I feel that the patent would benefit from admission.   CRITICAL CARE Performed by: Wynetta Fines   Total critical care time: 30 minutes  Critical care time was exclusive of separately billable procedures and treating other patients.  Critical care was necessary to treat  or prevent imminent or life-threatening deterioration.  Critical care was time spent personally by me on the following activities: development of treatment plan with patient and/or surrogate as well as nursing, discussions with consultants, evaluation of patient's response to treatment, examination of patient, obtaining history from patient or surrogate, ordering and performing treatments and interventions, ordering and review of laboratory studies, ordering and review of radiographic studies, pulse oximetry and re-evaluation of patient's condition.          Final Clinical Impression(s) / ED Diagnoses Final diagnoses:  Altered mental status, unspecified altered mental status type  Influenza    Rx / DC Orders ED Discharge Orders     None         Wynetta Fines, MD 12/29/23 1040

## 2023-12-29 NOTE — H&P (Signed)
 History and Physical    Patient: [name]:[id number] DOB: [birth year] DOA: 12/29/2023 DOS: the patient was seen and examined on 12/29/2023 PCP: Marcine Matar, MD  Patient coming from: Home  Chief Complaint:  Chief Complaint  Patient presents with   Seizures   Influenza   HPI: [name] is a 73 y.o. male with medical history significant of seizures, dementia, alcoholism, primary biliary cirrhosis, chronic hepatitis internal hemorrhoids, peripheral neuropathy, seizure disorder, history of stroke, history of hemiparesis who was brought to the emergency department due to having a seizure.  He was found to be febrile and he is influenza A PCR was positive.  He is unable to provide further history, but his nephew is stating that he has had similar URI symptoms for the past 2 to 3 days.  Lab work: Urinalysis showed specific gravity of 1.036 and small hemoglobin.  Influenza A PCR was positive.  UDS was negative.  CBC showed a white count 7.9, hemoglobin 13.9 g/dL platelets 956.  Alcohol and lactic acid level were normal.  CMP showed a sodium 133 mmol/L, alkaline phosphatase 136 units/L, glucose 122, creatinine 1.28 and calcium 8.9 mg/dL.  The rest of the CMP measurements were within expected range.   ED course: Initial vital signs were temperature 101.2 F, pulse 100, respiration 18, BP 119/75 mmHg O2 sat 91% on room air.  The patient received 650 mg of acetaminophen PR x 1, 1000 mL normal saline bolus, Zosyn and vancomycin.  I added methylprednisolone 40 mg IVP x 1.  Review of Systems: As mentioned in the history of present illness. All other systems reviewed and are negative. Past Medical History:  Diagnosis Date   Seizures (HCC)    History reviewed. No pertinent surgical history. Social History:  has no history on file for tobacco use, alcohol use, and drug use.  No Known Allergies  History reviewed. No pertinent family history.  Prior to Admission medications   Medication  Sig Start Date End Date Taking? Authorizing Provider  aspirin EC 81 MG tablet Take 81 mg by mouth daily. Swallow whole.   Yes [provider]  cyclobenzaprine (FLEXERIL) 5 MG tablet Take 5 mg by mouth at bedtime. Patient not taking: Reported on 12/29/2023 09/05/23   [provider]  Lacosamide 100 MG TABS Take 100 mg by mouth in the morning and at bedtime. 12/24/23  Yes [provider]  levETIRAcetam (KEPPRA) 750 MG tablet Take 1,500 mg by mouth 2 (two) times daily. 11/27/23  Yes [provider]  memantine (NAMENDA) 10 MG tablet Take 10 mg by mouth 2 (two) times daily. 09/24/23  Yes [provider]  pantoprazole (PROTONIX) 40 MG tablet Take 40 mg by mouth daily. 09/24/23  Yes [provider]  phenytoin (DILANTIN) 100 MG ER capsule Take 200 mg by mouth at bedtime. 09/23/23  Yes [provider]  PHENYTOIN INFATABS 50 MG tablet Chew 25 mg by mouth at bedtime. 11/27/23  Yes [provider]  rosuvastatin (CRESTOR) 10 MG tablet Take 10 mg by mouth daily. 11/15/23  Yes [provider]  simvastatin (ZOCOR) 20 MG tablet Take 20 mg by mouth at bedtime. Patient not taking: Reported on 12/29/2023 09/14/23   [provider]    Physical Exam: Vitals:   12/29/23 0757 12/29/23 0800 12/29/23 0813 12/29/23 0915  BP: 119/75 114/65    Pulse: 100 96  100  Resp: 18   20  Temp:   (!) 102.6 F (39.2 C)  TempSrc:   Rectal   SpO2: 91% 93%  91%   Physical Exam Vitals and nursing note reviewed.  Constitutional:      General: He is awake. He is not in acute distress.    Appearance: Normal appearance. He is ill-appearing.  HENT:     Head: Normocephalic.     Nose: No rhinorrhea.     Mouth/Throat:     Mouth: Mucous membranes are dry.  Eyes:     General: No scleral icterus.    Pupils: Pupils are equal, round, and reactive to light.  Neck:     Vascular: No JVD.  Cardiovascular:     Rate and Rhythm: Normal rate.     Heart sounds: S1  normal and S2 normal.  Pulmonary:     Effort: No accessory muscle usage.     Breath sounds: Wheezing and rhonchi present. No rales.  Abdominal:     General: Bowel sounds are normal. There is no distension.     Palpations: Abdomen is soft.     Tenderness: There is no abdominal tenderness.  Musculoskeletal:     Cervical back: Neck supple.     Right lower leg: No edema.     Left lower leg: No edema.  Skin:    General: Skin is warm and dry.  Neurological:     General: No focal deficit present.     Mental Status: He is alert and oriented to person, place, and time.  Psychiatric:        Mood and Affect: Mood normal.        Behavior: Behavior normal. Behavior is cooperative.     Data Reviewed:  Results are pending, will review when available.  EKG: Vent. rate 99 BPM PR interval 192 ms QRS duration 92 ms QT/QTcB 321/412 ms P-R-T axes 68 61 35 Sinus rhythm Abnormal R-wave progression, early transition  Assessment and Plan: Principal Problem:   Acute metabolic encephalopathy Secondary to postictal period due to:   Seizure disorder (HCC) Exacerbated by fever induced by:   Influenza A Progressive/inpatient. Continue supplemental oxygen. Methylprednisolone 40 mg IVP x 1. Begin oseltamivir 75 mg p.o. twice daily.. Scheduled and as needed bronchodilators. Follow-up CBC and chemistry in the morning.   Active Problems:   Thrombocytopenia (HCC) Monitor platelet count.    Liver mass Will need follow-up with further imaging.    Dementia (HCC) History of CVA/hemiparesis. Supportive care.    Advance Care Planning:   Code Status: Full Code   Consults:   Family Communication:   Severity of Illness: The appropriate patient status for this patient is INPATIENT. Inpatient status is judged to be reasonable and necessary in order to provide the required intensity of service to ensure the patient's safety. The patient's presenting symptoms, physical exam findings, and initial  radiographic and laboratory data in the context of their chronic comorbidities is felt to place them at high risk for further clinical deterioration. Furthermore, it is not anticipated that the patient will be medically stable for discharge from the hospital within 2 midnights of admission.   * I certify that at the point of admission it is my clinical judgment that the patient will require inpatient hospital care spanning beyond 2 midnights from the point of admission due to high intensity of service, high risk for further deterioration and high frequency of surveillance required.*  Author: Bobette Mo, MD 12/29/2023 12:08 PM  For on call review [email or web address].   This document was prepared using Conservation officer, historic buildings  and may contain some unintended transcription errors.

## 2023-12-29 NOTE — Significant Event (Signed)
 Alerted by staff that patient suddenly unresponsive. Arrived to bedside and attempted to assess pupillary response. Patient was resistant to this and starting moving around. Likewise, bedside RN concerned that patient did not swallow pill that was in his mouth. Oral suctioning attempted to which patient also resistant. Patient opened eyes and began following few simple commands. Advised bedside RN to keep NPO, notify on call provider, and inform RR nurse if any change or worsening. VS and CBG all within normal ranges.  Lamona Curl, RN

## 2023-12-29 NOTE — Progress Notes (Signed)
 ED Pharmacy Antibiotic Sign Off An antibiotic consult was received from an ED provider for vancomycin per pharmacy dosing for pneumonia. A chart review was completed to assess appropriateness.   No weight found in the patient's chart. Per ED NT, patient stated he weighs 150 lbs.  The following one time order(s) were placed:  Vancomycin 1500mg  IV x1  Further antibiotic and/or antibiotic pharmacy consults should be ordered by the admitting provider if indicated.   Thank you for allowing pharmacy to be a part of this patient's care.   Cherylin Mylar, [facility]  Clinical Pharmacist 12/29/23 8:51 AM

## 2023-12-29 NOTE — ED Notes (Signed)
 Pt refused oral temp but allowed axillary temp

## 2023-12-30 ENCOUNTER — Encounter: Payer: Self-pay | Admitting: Neurology

## 2023-12-30 ENCOUNTER — Other Ambulatory Visit: Payer: Self-pay

## 2023-12-30 ENCOUNTER — Encounter (HOSPITAL_COMMUNITY): Payer: Self-pay | Admitting: Internal Medicine

## 2023-12-30 DIAGNOSIS — G9341 Metabolic encephalopathy: Secondary | ICD-10-CM | POA: Diagnosis not present

## 2023-12-30 LAB — COMPREHENSIVE METABOLIC PANEL
ALT: 21 U/L (ref 0–44)
AST: 34 U/L (ref 15–41)
Albumin: 3.3 g/dL — ABNORMAL LOW (ref 3.5–5.0)
Alkaline Phosphatase: 104 U/L (ref 38–126)
Anion gap: 8 (ref 5–15)
BUN: 14 mg/dL (ref 8–23)
CO2: 23 mmol/L (ref 22–32)
Calcium: 7.9 mg/dL — ABNORMAL LOW (ref 8.9–10.3)
Chloride: 102 mmol/L (ref 98–111)
Creatinine, Ser: 0.99 mg/dL (ref 0.61–1.24)
GFR, Estimated: >60 mL/min (ref >60)
Glucose, Bld: 125 mg/dL — ABNORMAL HIGH (ref 70–99)
Potassium: 4.3 mmol/L (ref 3.5–5.1)
Sodium: 133 mmol/L — ABNORMAL LOW (ref 135–145)
Total Bilirubin: 0.6 mg/dL (ref 0.0–1.2)
Total Protein: 6.8 g/dL (ref 6.5–8.1)

## 2023-12-30 LAB — CBC
HCT: 38.5 % — ABNORMAL LOW (ref 39.0–52.0)
Hemoglobin: 13.2 g/dL (ref 13.0–17.0)
MCH: 32.7 pg (ref 26.0–34.0)
MCHC: 34.3 g/dL (ref 30.0–36.0)
MCV: 95.3 fL (ref 80.0–100.0)
Platelets: 128 10*3/uL — ABNORMAL LOW (ref 150–400)
RBC: 4.04 MIL/uL — ABNORMAL LOW (ref 4.22–5.81)
RDW: 12.1 % (ref 11.5–15.5)
WBC: 6.9 10*3/uL (ref 4.0–10.5)
nRBC: 0 % (ref 0.0–0.2)

## 2023-12-30 LAB — PROCALCITONIN: Procalcitonin: 0.45 ng/mL

## 2023-12-30 MED ORDER — OSELTAMIVIR PHOSPHATE 75 MG PO CAPS: 75.0000 mg | ORAL_CAPSULE | Freq: Two times a day (BID) | ORAL | 0 refills | Status: DC

## 2023-12-30 MED ORDER — ONDANSETRON HCL 4 MG PO TABS: 4.0000 mg | ORAL_TABLET | Freq: Four times a day (QID) | ORAL | 0 refills | Status: DC | PRN

## 2023-12-30 MED ORDER — ORAL CARE MOUTH RINSE: 15.0000 mL | OROMUCOSAL | Status: DC | PRN

## 2023-12-30 MED ORDER — LACTATED RINGERS IV SOLN: INTRAVENOUS | Status: DC

## 2023-12-30 MED ORDER — GUAIFENESIN ER 600 MG PO TB12: 600.0000 mg | ORAL_TABLET | Freq: Two times a day (BID) | ORAL | 0 refills | Status: DC

## 2023-12-30 NOTE — Progress Notes (Signed)
 PHARMACY - PHYSICIAN COMMUNICATION CRITICAL VALUE ALERT - BLOOD CULTURE IDENTIFICATION (BCID)  [name] is an 73 y.o. male who presented to [facility] on 12/29/2023 with a chief complaint of seizure.   Assessment:  1/3 GPR  Name of physician (or Provider) Contacted: [name]  Current antibiotics: azith/cxt  Changes to prescribed antibiotics recommended:  None, probable contaminant  No results found for this or any previous visit.  Arley Phenix RPh 12/30/2023, 2:29 AM

## 2023-12-30 NOTE — TOC Transition Note (Signed)
 Transition of Care [facility]) - Discharge Note   Patient Details  Name: [name] MRN: [id number] Date of Birth: 03/24/1951  [facility]) CM/SW Contact:  Lanier Clam, RN Phone Number: 12/30/2023, 2:12 PM   Clinical Narrative:  [facility] rep Angela HHPT.     Final next level of care: Home w Home Health Services Barriers to Discharge: No Barriers Identified   Patient Goals and CMS Choice Patient states their goals for this hospitalization and ongoing recovery are:: Home CMS Medicare.gov Compare Post Acute Care list provided to:: Patient Choice offered to / list presented to : Patient [place] ownership interest in [facility].provided to:: Patient    Discharge Placement                       Discharge Plan and Services Additional resources added to the After Visit Summary for     Discharge Planning Services: CM Consult                      HH Arranged: PT [facility] Agency: [facility] Date [facility] Agency Contacted: 12/30/23 Time HH Agency Contacted: 1412 Representative spoke with at [facility] Agency: Marylene Land  Social Drivers of Health (SDOH) Interventions SDOH Screenings   Food Insecurity: No Food Insecurity (12/30/2023)  Housing: Low Risk  (12/30/2023)  Transportation Needs: No Transportation Needs (12/30/2023)  Utilities: Not At Risk (12/30/2023)  Social Connections: Moderately Isolated (12/30/2023)     Readmission Risk Interventions     No data to display

## 2023-12-30 NOTE — Progress Notes (Signed)
 AVS reviewed w/ pt & nephew who verbalized an understanding- no other questions at this time. Pt dressed  -  to lobby via w/c w/  family.

## 2023-12-30 NOTE — Care Management Important Message (Signed)
 Important Message  Patient Details IM Letter given Name: [name] MRN: [id number] Date of Birth: Jan 18, 1951   Important Message Given:  Yes - Medicare IM     [name] 12/30/2023, 1:33 PM

## 2023-12-30 NOTE — TOC Transition Note (Signed)
 Transition of Care [facility]) - Discharge Note   Patient Details  Name: [name] MRN: [id number] Date of Birth: 09-25-1951  [facility] Care [facility]) CM/SW Contact:  Lanier Clam, RN Phone Number: 12/30/2023, 11:54 AM   Clinical Narrative:  d/c plan home. Has family support, uses a cane. Has own transport.     Final next level of care: Home/Self Care Barriers to Discharge: No Barriers Identified   Patient Goals and CMS Choice Patient states their goals for this hospitalization and ongoing recovery are:: Home CMS Medicare.gov Compare Post Acute Care list provided to:: Patient Represenative (must comment) ([name](nephew)) Choice offered to / list presented to : Patient  ownership interest in [facility].provided to:: Patient    Discharge Placement                       Discharge Plan and Services Additional resources added to the After Visit Summary for     Discharge Planning Services: CM Consult                                 Social Drivers of Health (SDOH) Interventions SDOH Screenings   Food Insecurity: No Food Insecurity (12/30/2023)  Housing: Low Risk  (12/30/2023)  Transportation Needs: No Transportation Needs (12/30/2023)  Utilities: Not At Risk (12/30/2023)  Social Connections: Moderately Isolated (12/30/2023)     Readmission Risk Interventions     No data to display

## 2023-12-30 NOTE — Care Management Obs Status (Signed)
 MEDICARE OBSERVATION STATUS NOTIFICATION   Patient Details  Name: [name] MRN: [id number] Date of Birth: November 16, 1950   Medicare Observation Status Notification Given:  Yes    MahabirOlegario Messier, RN 12/30/2023, 2:27 PM

## 2023-12-30 NOTE — Evaluation (Signed)
 Physical Therapy Evaluation Patient Details Name: [name] MRN: [id number] DOB: [birth year] Today's Date: 12/30/2023  History of Present Illness  73 y.o. male was brought to the emergency department 12/29/23 due to having a seizure, found to have influenza A. GMW:[id number], dementia, alcoholism, primary biliary cirrhosis, chronic hepatitis internal hemorrhoids, peripheral neuropathy, seizure disorder, history of stroke, history of hemiparesis  Clinical Impression  Pt admitted with above diagnosis.  Pt currently with functional limitations due to the deficits listed below (see PT Problem List). Pt will benefit from acute skilled PT to increase their independence and safety with mobility to allow discharge.     The patient  is able to participate in evaluation, Appears near baseline cognitively. Patient's sister in  room and spoke to primary sister/caregiver on phone. Patient  ambulates with Cane at baseline, has used RW in past.  Currently, recommend CGA and use of RW at home and HHPT, sister agrees with plan.        If plan is discharge home, recommend the following: A little help with walking and/or transfers;A little help with bathing/dressing/bathroom;Assistance with cooking/housework;Assist for transportation;Help with stairs or ramp for entrance   Can travel by private vehicle        Equipment Recommendations None recommended by PT  Recommendations for Other Services       Functional Status Assessment Patient has had a recent decline in their functional status and demonstrates the ability to make significant improvements in function in a reasonable and predictable amount of time.     Precautions / Restrictions Precautions Precautions: Fall Recall of Precautions/Restrictions: Impaired Restrictions Weight Bearing Restrictions Per Provider Order: No      Mobility  Bed Mobility Overal bed mobility: Needs Assistance Bed Mobility: Supine to Sit     Supine to sit:  Supervision     General bed mobility comments: cues for safety    Transfers   Equipment used: Rolling walker (2 wheels), Straight cane               General transfer comment: Started with SPC but deemed unsteady,    Ambulation/Gait Ambulation/Gait assistance: Contact guard assist Gait Distance (Feet): 80 Feet Assistive device: Rolling walker (2 wheels), Straight cane Gait Pattern/deviations: Step-to pattern Gait velocity: decr     General Gait Details: started  with SPC but deemed unsteady so provided a RW which did improve safety/balance.  Stairs            Wheelchair Mobility     Tilt Bed    Modified Rankin (Stroke Patients Only)       Balance Overall balance assessment: Needs assistance Sitting-balance support: Feet supported, No upper extremity supported Sitting balance-Leahy Scale: Good     Standing balance support: Bilateral upper extremity supported, During functional activity, Reliant on assistive device for balance Standing balance-Leahy Scale: Poor                               Pertinent Vitals/Pain Pain Assessment Pain Assessment: No/denies pain    Home Living Family/patient expects to be discharged to:: Private residence Living Arrangements: Other relatives Available Help at Discharge: Family;Available 24 hours/day Type of Home: Apartment Home Access: Stairs to enter   Entrance Stairs-Number of Steps: 3   Home Layout: One level Home Equipment: Cane - single Librarian, academic (2 wheels) Additional Comments: sister in room provided some confusing information. sister who is promary caregiver on phone who reports that patient has  24/7 although  she states thatnshe works 3rd shift. did state that someone would be availble.    Prior Function Prior Level of Function : Needs assist  Cognitive Assist : Mobility (cognitive);ADLs (cognitive) Mobility (Cognitive): Set up cues ADLs (Cognitive): Set up cues       Mobility  Comments: uses a cane at baseline , able to  ambulate with SPC, no needed assistance ADLs Comments: able to get own shower, per sister.     Extremity/Trunk Assessment   Upper Extremity Assessment Upper Extremity Assessment: RUE deficits/detail;LUE deficits/detail LUE Coordination: decreased fine motor    Lower Extremity Assessment Lower Extremity Assessment: LLE deficits/detail LLE Coordination: decreased fine motor    Cervical / Trunk Assessment Cervical / Trunk Assessment: Normal  Communication   Communication Communication: Impaired Factors Affecting Communication: Difficulty expressing self    Cognition Arousal: Alert Behavior During Therapy: WFL for tasks assessed/performed   PT - Cognitive impairments: Memory, History of cognitive impairments                         Following commands: Intact       Cueing Cueing Techniques: Verbal cues, Tactile cues     General Comments      Exercises     Assessment/Plan    PT Assessment Patient needs continued PT services  PT Problem List Decreased balance;Decreased cognition;Decreased range of motion;Decreased mobility;Decreased knowledge of precautions;Decreased activity tolerance       PT Treatment Interventions DME instruction;Functional mobility training;Patient/family education;Gait training;Therapeutic activities;Therapeutic exercise;Cognitive remediation    PT Goals (Current goals can be found in the Care Plan section)  Acute Rehab PT Goals Patient Stated Goal: per sister, return home PT Goal Formulation: With patient/family Time For Goal Achievement: 01/13/24 Potential to Achieve Goals: Good    Frequency Min 3X/week     Co-evaluation               AM-PAC PT "6 Clicks" Mobility  Outcome Measure Help needed turning from your back to your side while in a flat bed without using bedrails?: None Help needed moving from lying on your back to sitting on the side of a flat bed without using  bedrails?: None Help needed moving to and from a bed to a chair (including a wheelchair)?: A Little Help needed standing up from a chair using your arms (e.g., wheelchair or bedside chair)?: A Little Help needed to walk in hospital room?: A Little Help needed climbing 3-5 steps with a railing? : A Lot 6 Click Score: 19    End of Session Equipment Utilized During Treatment: Gait belt Activity Tolerance: Patient tolerated treatment well Patient left: in chair;with call bell/phone within reach;with chair alarm set Nurse Communication: Mobility status PT Visit Diagnosis: Unsteadiness on feet (R26.81);Difficulty in walking, not elsewhere classified (R26.2);Other symptoms and signs involving the nervous system (R29.898)    Time: 2956-2130 PT Time Calculation (min) (ACUTE ONLY): 27 min   Charges:   PT Evaluation $PT Eval Low Complexity: 1 Low PT Treatments $Gait Training: 8-22 mins PT General Charges $$ ACUTE PT VISIT: 1 Visit         Blanchard Kelch PT Acute Rehabilitation Services Office [phone] Weekend [phone]   Rada Hay 12/30/2023, 1:46 PM

## 2023-12-30 NOTE — Care Management CC44 (Signed)
 Condition Code 44 Documentation Completed  Patient Details  Name: [name] MRN: [id number] Date of Birth: [birth year]   Condition Code 44 given:  Yes Patient signature on Condition Code 44 notice:  Yes Documentation of 2 MD's agreement:  Yes Code 44 added to claim:  Yes    Lanier Clam, RN 12/30/2023, 2:27 PM

## 2023-12-30 NOTE — Discharge Summary (Signed)
 Physician Discharge Summary   Patient: [name] MRN: [id number] DOB: 1950-11-17  Admit date:     12/29/2023  Discharge date: 12/30/23  Discharge Physician: Deanna Artis   PCP: Marcine Matar, MD   Recommendations at discharge:   At this time patient will be discharged home with home health.  If you experience any symptoms such as fever, vomiting, shortness of breath, chest pain, abdominal pain, or other concerning symptoms, please call your primary care provider or go to the emergency department immediately.  Discharge Diagnoses: Principal Problem:   Acute metabolic encephalopathy Active Problems:   Thrombocytopenia (HCC)   Liver mass   Influenza A   Seizure disorder (HCC)   Dementia (HCC)  Resolved Problems:   * No resolved hospital problems. *  Hospital Course: 73 y.o. male with medical history significant of seizures, dementia, alcoholism, primary biliary cirrhosis, chronic hepatitis internal hemorrhoids, peripheral neuropathy, seizure disorder, history of stroke, history of hemiparesis who was brought to the emergency department due to having a seizure, found to have influenza A.   Assessment and Plan:  Acute metabolic encephalopathy - Likely postictal.  Exacerbated by dehydration and influenza A.  Appears to be resolved at this time.  Acute influenza A - IV fluid hydration initiated while inpatient.  Initiated on Tamiflu twice daily.  Will give prescription for remaining Tamiflu.  Patient not hypoxic.  Mucinex prescription provided.  Seizure disorder - Likely exacerbated by dehydration and influenza A.  No seizure appreciated while inpatient.  Resume home antiepileptic medication regiment.  History of CVA with left hemiparesis and physical debilitation - Evaluated by physical therapy.  Recommending home health PT.  Patient has family support at home.  Incidental liver mass - 2.2 cm mass noted incidentally on CT abdomen.  Recommending nonemergent outpatient  MRI with and without contrast.  This can be scheduled with PCP after patient's influenza resolved.        Consultants: None Procedures performed: None Disposition: Home health Diet recommendation:  Discharge Diet Orders (From admission, onward)     Start     Ordered   12/30/23 0000  Diet - low sodium heart healthy        12/30/23 1317           Cardiac diet DISCHARGE MEDICATION: Allergies as of 12/30/2023   No Known Allergies      Medication List     STOP taking these medications    cyclobenzaprine 5 MG tablet Commonly known as: FLEXERIL   simvastatin 20 MG tablet Commonly known as: ZOCOR       TAKE these medications    aspirin EC 81 MG tablet Take 81 mg by mouth daily. Swallow whole.   guaiFENesin 600 MG 12 hr tablet Commonly known as: Mucinex Take 1 tablet (600 mg total) by mouth 2 (two) times daily.   Lacosamide 100 MG Tabs Take 100 mg by mouth in the morning and at bedtime.   levETIRAcetam 750 MG tablet Commonly known as: KEPPRA Take 1,500 mg by mouth 2 (two) times daily.   memantine 10 MG tablet Commonly known as: NAMENDA Take 10 mg by mouth 2 (two) times daily.   ondansetron 4 MG tablet Commonly known as: ZOFRAN Take 1 tablet (4 mg total) by mouth every 6 (six) hours as needed for nausea.   oseltamivir 75 MG capsule Commonly known as: TAMIFLU Take 1 capsule (75 mg total) by mouth 2 (two) times daily.   pantoprazole 40 MG tablet Commonly known as: PROTONIX  Take 40 mg by mouth daily.   phenytoin 100 MG ER capsule Commonly known as: DILANTIN Take 200 mg by mouth at bedtime.   Phenytoin Infatabs 50 MG tablet Generic drug: phenytoin Chew 25 mg by mouth at bedtime.   rosuvastatin 10 MG tablet Commonly known as: CRESTOR Take 10 mg by mouth daily.        Discharge Exam: Filed Weights   12/29/23 1634  Weight: 77.1 kg   GENERAL:  Alert, pleasant, pleasantly demented HEENT:  EOMI CARDIOVASCULAR:  RRR, no murmurs  appreciated RESPIRATORY: Coarse upper airway sounds cleared with cough, no wheezing appreciated GASTROINTESTINAL:  Soft, nontender, nondistended EXTREMITIES:  No LE edema bilaterally NEURO:  No new focal deficits appreciated SKIN:  No rashes noted PSYCH:  Appropriate mood and affect    Condition at discharge: improving  The results of significant diagnostics from this hospitalization (including imaging, microbiology, ancillary and laboratory) are listed below for reference.   Imaging Studies: CT Angio Chest PE W and/or Wo Contrast Result Date: 12/29/2023 CLINICAL DATA:  Pulmonary embolism (PE) suspected, low to intermediate prob, neg D-dimer; Abdominal pain, acute, nonlocalized. Postictal, history of seizures. Cough. Abdominal pain. EXAM: CT ANGIOGRAPHY CHEST CT ABDOMEN AND PELVIS WITH CONTRAST TECHNIQUE: Multidetector CT imaging of the chest was performed using the standard protocol during bolus administration of intravenous contrast. Multiplanar CT image reconstructions and MIPs were obtained to evaluate the vascular anatomy. Multidetector CT imaging of the abdomen and pelvis was performed using the standard protocol during bolus administration of intravenous contrast. RADIATION DOSE REDUCTION: This exam was performed according to the departmental dose-optimization program which includes automated exposure control, adjustment of the mA and/or kV according to patient size and/or use of iterative reconstruction technique. CONTRAST:  OMNIPAQUE IOHEXOL 350 MG/ML SOLN COMPARISON:  Chest radiograph from earlier today. 05/01/2017 CT virtual colonoscopy. FINDINGS: CTA CHEST FINDINGS Cardiovascular: The study is moderate quality for the evaluation of pulmonary embolism, degraded by motion and streak artifact. There are no convincing filling defects in the central, lobar, segmental or subsegmental pulmonary artery branches to suggest acute pulmonary embolism. Atherosclerotic nonaneurysmal thoracic aorta.  Normal caliber pulmonary arteries. Normal heart size. No significant pericardial fluid/thickening. Left anterior descending coronary atherosclerosis. Mediastinum/Nodes: No significant thyroid nodules. Unremarkable esophagus. No pathologically enlarged axillary, mediastinal or hilar lymph nodes. Lungs/Pleura: No pneumothorax. No pleural effusion. Mild paraseptal and centrilobular emphysema. No acute consolidative airspace disease, lung masses or significant pulmonary nodules. Mildly thickened curvilinear parenchymal band in the peripheral left lower lobe compatible with nonspecific postinfectious/postinflammatory scarring. Musculoskeletal: No aggressive appearing focal osseous lesions. Mild thoracic spondylosis. Review of the MIP images confirms the above findings. CT ABDOMEN and PELVIS FINDINGS Substantially motion degraded scan, limiting assessment. Hepatobiliary: Segment 8 right liver 2.2 x 1.7 cm mass with apparent targetoid enhancement (series 3/image 14), not definitely seen on prior noncontrast CT images from 05/01/2017. Hypodense 1.4 cm anterior segment 2 left liver lesion (series 3/image 18), not definitely seen on prior noncontrast CT. Subcentimeter hypodense peripheral right liver lesion is too small to characterize and unchanged from 2018, considered benign. Normal gallbladder with no radiopaque cholelithiasis. No biliary ductal dilatation. Pancreas: Normal, with no mass or duct dilation. Spleen: Normal size. No mass. Adrenals/Urinary Tract: Normal adrenals. Subcentimeter hypodense upper right renal cortical lesion, too small to characterize, for which no follow-up imaging is recommended. No hydronephrosis. Normal bladder. Stomach/Bowel: Small hiatal hernia. Otherwise normal nondistended stomach. Normal caliber small bowel with no small bowel wall thickening. Normal appendix. Normal large bowel with no  diverticulosis, large bowel wall thickening or pericolonic fat stranding. Vascular/Lymphatic:  Atherosclerotic nonaneurysmal abdominal aorta. Patent portal, splenic, hepatic and renal veins. No pathologically enlarged lymph nodes in the abdomen or pelvis. Reproductive: Top-normal size prostate. Other: No pneumoperitoneum, ascites or focal fluid collection. Musculoskeletal: No aggressive appearing focal osseous lesions. Left total hip arthroplasty. Mild lumbar spondylosis. Review of the MIP images confirms the above findings. IMPRESSION: 1. Substantially motion degraded scan, limiting assessment. 2. No evidence of acute pulmonary embolism. No acute abnormality in the chest. 3. Indeterminate segment 8 right liver 2.2 cm mass with apparent targetoid enhancement and hypodense 1.4 cm anterior segment 2 left liver lesion, not definitely seen on prior noncontrast CT from 2018. Malignancy not excluded. Recommend further evaluation with nonemergent abdominal MRI without and with IV contrast. 4. One vessel coronary atherosclerosis. 5. Small hiatal hernia. 6. Aortic Atherosclerosis (ICD10-I70.0) and Emphysema (ICD10-J43.9). Electronically Signed   By: Delbert Phenix M.D.   On: 12/29/2023 10:03   CT ABDOMEN PELVIS W CONTRAST Result Date: 12/29/2023 CLINICAL DATA:  Pulmonary embolism (PE) suspected, low to intermediate prob, neg D-dimer; Abdominal pain, acute, nonlocalized. Postictal, history of seizures. Cough. Abdominal pain. EXAM: CT ANGIOGRAPHY CHEST CT ABDOMEN AND PELVIS WITH CONTRAST TECHNIQUE: Multidetector CT imaging of the chest was performed using the standard protocol during bolus administration of intravenous contrast. Multiplanar CT image reconstructions and MIPs were obtained to evaluate the vascular anatomy. Multidetector CT imaging of the abdomen and pelvis was performed using the standard protocol during bolus administration of intravenous contrast. RADIATION DOSE REDUCTION: This exam was performed according to the departmental dose-optimization program which includes automated exposure control,  adjustment of the mA and/or kV according to patient size and/or use of iterative reconstruction technique. CONTRAST:  OMNIPAQUE IOHEXOL 350 MG/ML SOLN COMPARISON:  Chest radiograph from earlier today. 05/01/2017 CT virtual colonoscopy. FINDINGS: CTA CHEST FINDINGS Cardiovascular: The study is moderate quality for the evaluation of pulmonary embolism, degraded by motion and streak artifact. There are no convincing filling defects in the central, lobar, segmental or subsegmental pulmonary artery branches to suggest acute pulmonary embolism. Atherosclerotic nonaneurysmal thoracic aorta. Normal caliber pulmonary arteries. Normal heart size. No significant pericardial fluid/thickening. Left anterior descending coronary atherosclerosis. Mediastinum/Nodes: No significant thyroid nodules. Unremarkable esophagus. No pathologically enlarged axillary, mediastinal or hilar lymph nodes. Lungs/Pleura: No pneumothorax. No pleural effusion. Mild paraseptal and centrilobular emphysema. No acute consolidative airspace disease, lung masses or significant pulmonary nodules. Mildly thickened curvilinear parenchymal band in the peripheral left lower lobe compatible with nonspecific postinfectious/postinflammatory scarring. Musculoskeletal: No aggressive appearing focal osseous lesions. Mild thoracic spondylosis. Review of the MIP images confirms the above findings. CT ABDOMEN and PELVIS FINDINGS Substantially motion degraded scan, limiting assessment. Hepatobiliary: Segment 8 right liver 2.2 x 1.7 cm mass with apparent targetoid enhancement (series 3/image 14), not definitely seen on prior noncontrast CT images from 05/01/2017. Hypodense 1.4 cm anterior segment 2 left liver lesion (series 3/image 18), not definitely seen on prior noncontrast CT. Subcentimeter hypodense peripheral right liver lesion is too small to characterize and unchanged from 2018, considered benign. Normal gallbladder with no radiopaque cholelithiasis. No  biliary ductal dilatation. Pancreas: Normal, with no mass or duct dilation. Spleen: Normal size. No mass. Adrenals/Urinary Tract: Normal adrenals. Subcentimeter hypodense upper right renal cortical lesion, too small to characterize, for which no follow-up imaging is recommended. No hydronephrosis. Normal bladder. Stomach/Bowel: Small hiatal hernia. Otherwise normal nondistended stomach. Normal caliber small bowel with no small bowel wall thickening. Normal appendix. Normal large bowel with no  diverticulosis, large bowel wall thickening or pericolonic fat stranding. Vascular/Lymphatic: Atherosclerotic nonaneurysmal abdominal aorta. Patent portal, splenic, hepatic and renal veins. No pathologically enlarged lymph nodes in the abdomen or pelvis. Reproductive: Top-normal size prostate. Other: No pneumoperitoneum, ascites or focal fluid collection. Musculoskeletal: No aggressive appearing focal osseous lesions. Left total hip arthroplasty. Mild lumbar spondylosis. Review of the MIP images confirms the above findings. IMPRESSION: 1. Substantially motion degraded scan, limiting assessment. 2. No evidence of acute pulmonary embolism. No acute abnormality in the chest. 3. Indeterminate segment 8 right liver 2.2 cm mass with apparent targetoid enhancement and hypodense 1.4 cm anterior segment 2 left liver lesion, not definitely seen on prior noncontrast CT from 2018. Malignancy not excluded. Recommend further evaluation with nonemergent abdominal MRI without and with IV contrast. 4. One vessel coronary atherosclerosis. 5. Small hiatal hernia. 6. Aortic Atherosclerosis (ICD10-I70.0) and Emphysema (ICD10-J43.9). Electronically Signed   By: Delbert Phenix M.D.   On: 12/29/2023 10:03   DG Chest Portable 1 View Result Date: 12/29/2023 CLINICAL DATA:  cough EXAM: PORTABLE CHEST 1 VIEW COMPARISON:  Feb 24, 2023, November 29, 2013 FINDINGS: The cardiomediastinal silhouette is unchanged in contour.Similar elevation of the LEFT  hemidiaphragm with signs of underlying LEFT-sided volume loss. No pleural effusion. No pneumothorax. Asymmetric reticulonodular opacities throughout the LEFT lung, mildly increased since prior. More focal heterogeneous opacity along the LEFT lung base, similar comparison to priors. IMPRESSION: 1. Asymmetric reticulonodular opacities throughout the LEFT lung, mildly increased since prior. This could reflect aspiration, atelectasis or atypical infection superimposed on a background of chronic interstitial lung disease or emphysema. 2. More focal heterogeneous opacity along the LEFT lung base, similar in comparison to priors. This could reflect atelectasis versus infection. 3. Similar appearance of asymmetric LEFT-sided volume loss. Given underlying emphysema, recommend evaluation for candidacy for annual lung cancer screening CT. Electronically Signed   By: Meda Klinefelter M.D.   On: 12/29/2023 08:17   CT Head Wo Contrast Result Date: 12/29/2023 CLINICAL DATA:  73 year old male found with seizure activity on toilet. EXAM: CT HEAD WITHOUT CONTRAST TECHNIQUE: Contiguous axial images were obtained from the base of the skull through the vertex without intravenous contrast. RADIATION DOSE REDUCTION: This exam was performed according to the departmental dose-optimization program which includes automated exposure control, adjustment of the mA and/or kV according to patient size and/or use of iterative reconstruction technique. COMPARISON:  Brain MRI 07/26/2020, head CT 09/12/2023. FINDINGS: Brain: Right hemisphere encephalomalacia with ex vacuo ventricular enlargement, stable. Chronic disproportionate bilateral cerebellar volume loss also appears stable. No midline shift, acute ventriculomegaly, mass effect, evidence of mass lesion, intracranial hemorrhage or evidence of cortically based acute infarction. Stable gray-white matter differentiation throughout the brain. Vascular: Calcified atherosclerosis at the skull  base. No suspicious intracranial vascular hyperdensity. Skull: Stable, intact.  Right TMJ degeneration. Sinuses/Orbits: New bilateral tympanic cavity and mastoid opacification since November. Paranasal sinuses remain well aerated. Other: No acute orbit or scalp soft tissue finding. IMPRESSION: 1. No acute intracranial abnormality. Stable non contrast CT appearance of the brain with encephalomalacia most pronounced in the right hemisphere. 2. New bilateral tympanic cavity and mastoid opacification since November, consider Acute Otitis Media with mastoid effusions. Electronically Signed   By: Odessa Fleming M.D.   On: 12/29/2023 07:50    Microbiology: Results for orders placed or performed during the hospital encounter of 12/29/23  Resp panel by RT-PCR (RSV, Flu A&B, Covid) Anterior Nasal Swab     Status: Abnormal   Collection Time: 12/29/23  7:22 AM  Specimen: Anterior Nasal Swab  Result Value Ref Range Status   SARS Coronavirus 2 by RT PCR NEGATIVE NEGATIVE Final    Comment: (NOTE) SARS-CoV-2 target nucleic acids are NOT DETECTED.  The SARS-CoV-2 RNA is generally detectable in upper respiratory specimens during the acute phase of infection. The lowest concentration of SARS-CoV-2 viral copies this assay can detect is 138 copies/mL. A negative result does not preclude SARS-Cov-2 infection and should not be used as the sole basis for treatment or other patient management decisions. A negative result may occur with  improper specimen collection/handling, submission of specimen other than nasopharyngeal swab, presence of viral mutation(s) within the areas targeted by this assay, and inadequate number of viral copies(<138 copies/mL). A negative result must be combined with clinical observations, patient history, and epidemiological information. The expected result is Negative.  Fact Sheet for Patients:  BloggerCourse.com  Fact Sheet for Healthcare Providers:   SeriousBroker.it  This test is no t yet approved or cleared by the [place] FDA and  has been authorized for detection and/or diagnosis of SARS-CoV-2 by FDA under an Emergency Use Authorization (EUA). This EUA will remain  in effect (meaning this test can be used) for the duration of the COVID-19 declaration under Section 564(b)(1) of the Act, 21 U.S.C.section 360bbb-3(b)(1), unless the authorization is terminated  or revoked sooner.       Influenza A by PCR POSITIVE (A) NEGATIVE Final   Influenza B by PCR NEGATIVE NEGATIVE Final    Comment: (NOTE) The Xpert Xpress SARS-CoV-2/FLU/RSV plus assay is intended as an aid in the diagnosis of influenza from Nasopharyngeal swab specimens and should not be used as a sole basis for treatment. Nasal washings and aspirates are unacceptable for Xpert Xpress SARS-CoV-2/FLU/RSV testing.  Fact Sheet for Patients: BloggerCourse.com  Fact Sheet for Healthcare Providers: SeriousBroker.it  This test is not yet approved or cleared by the [place] FDA and has been authorized for detection and/or diagnosis of SARS-CoV-2 by FDA under an Emergency Use Authorization (EUA). This EUA will remain in effect (meaning this test can be used) for the duration of the COVID-19 declaration under Section 564(b)(1) of the Act, 21 U.S.C. section 360bbb-3(b)(1), unless the authorization is terminated or revoked.     Resp Syncytial Virus by PCR NEGATIVE NEGATIVE Final    Comment: (NOTE) Fact Sheet for Patients: BloggerCourse.com  Fact Sheet for Healthcare Providers: SeriousBroker.it  This test is not yet approved or cleared by the [place] FDA and has been authorized for detection and/or diagnosis of SARS-CoV-2 by FDA under an Emergency Use Authorization (EUA). This EUA will remain in effect (meaning this test can be used)  for the duration of the COVID-19 declaration under Section 564(b)(1) of the Act, 21 U.S.C. section 360bbb-3(b)(1), unless the authorization is terminated or revoked.  Performed at [facility], [address] [place], [place] [address]   Culture, blood (routine x 2)     Status: None (Preliminary result)   Collection Time: 12/29/23  8:28 AM   Specimen: BLOOD  Result Value Ref Range Status   Specimen Description   Final    BLOOD RIGHT ANTECUBITAL Performed at [facility], [address] [place], [place] [address]    Special Requests   Final    BOTTLES DRAWN AEROBIC AND ANAEROBIC Blood Culture results may not be optimal due to an inadequate volume of blood received in culture bottles Performed at [facility] [address], [place], [place] [address]  Culture  Setup Time   Final    GRAM POSITIVE RODS ANAEROBIC BOTTLE ONLY CRITICAL RESULT CALLED TO, READ BACK BY AND VERIFIED WITH: PHARMD E JACKSON 12/30/2023 @ 0116 BY AB    Culture   Final    CULTURE REINCUBATED FOR BETTER GROWTH Performed at [facility], [address] [place], [place] [address]    Report Status PENDING  Incomplete  Culture, blood (routine x 2)     Status: None (Preliminary result)   Collection Time: 12/29/23  4:43 PM   Specimen: BLOOD LEFT ARM  Result Value Ref Range Status   Specimen Description   Final    BLOOD LEFT ARM Performed at [facility], [address] [place], [place] [address]    Special Requests   Final    BOTTLES DRAWN AEROBIC ONLY Blood Culture adequate volume Performed at [facility], [address] [place], [place] [address]    Culture PENDING  Incomplete   Report Status PENDING  Incomplete    Labs: CBC: Recent Labs  Lab 12/29/23 0751 12/30/23 0345  WBC 7.9 6.9  NEUTROABS 6.3  --   HGB 13.9 13.2  HCT 42.1 38.5*  MCV 96.3 95.3  PLT 142* 128*   Basic Metabolic Panel: Recent Labs   Lab 12/29/23 0751 12/30/23 0345  NA 133* 133*  K 4.3 4.3  CL 100 102  CO2 24 23  GLUCOSE 122* 125*  BUN 21 14  CREATININE 1.28* 0.99  CALCIUM 8.7* 7.9*   Liver Function Tests: Recent Labs  Lab 12/29/23 0751 12/30/23 0345  AST 28 34  ALT 22 21  ALKPHOS 136* 104  BILITOT 0.5 0.6  PROT 7.7 6.8  ALBUMIN 3.8 3.3*   CBG: Recent Labs  Lab 12/29/23 2004  GLUCAP 176*    Discharge time spent: less than 30 minutes.  Signed: Deanna Artis, DO [facility] 12/30/2023

## 2023-12-31 ENCOUNTER — Telehealth: Payer: Self-pay

## 2023-12-31 LAB — CULTURE, BLOOD (ROUTINE X 2)

## 2023-12-31 NOTE — Transitions of Care (Post Inpatient/ED Visit) (Signed)
   12/31/2023  Name: [name] MRN: [id number] DOB: April 08, 1951  Today's TOC FU Call Status: Today's TOC FU Call Status:: Successful TOC FU Call Completed TOC FU Call Complete Date: 12/31/23 Patient's Name and Date of Birth confirmed.  Transition Care Management Follow-up Telephone Call Date of Discharge: 12/30/23 Discharge Facility: [facility]) Type of Discharge: Inpatient Admission Primary Inpatient Discharge Diagnosis:: acute metabolic encephalopathy How have you been since you were released from the hospital?: Same (Call completed with patient's nephew, [name].   He said that the patient is sleeping alot due to the flu. 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) Any questions or concerns?: No  Items Reviewed: Did you receive and understand the discharge instructions provided?: Yes Medications obtained,verified, and reconciled?: No Medications Not Reviewed Reasons:: Other: Scientist, product/process development said he has all of the patient's medications and did not have any questions about the med regime and did not need to review the med list.) Any new allergies since your discharge?: No Dietary orders reviewed?: Yes Type of Diet Ordered:: heart healthy, low sodium Do you have support at home?: Yes People in Home: other relative(s) Name of Support/Comfort Primary Source: he lives with his sister and his nephew, [name].  Medications Reviewed Today: Medications Reviewed Today   Medications were not reviewed in this encounter     Home Care and Equipment/Supplies: Were Home Health Services Ordered?: Yes Name of Home Health Agency:: [facility] Has Agency set up a time to come to your home?: Yes First Home Health Visit Date:  [name] said the agency has called twice to schedule a home visit but he has declined at this time due to having the flu hmself.   he has requested they call back in a few days.) Any new equipment or medical supplies ordered?: No  Functional Questionnaire: Do you need  assistance with bathing/showering or dressing?: No Do you need assistance with meal preparation?: Yes (family assists as needed) Do you need assistance with eating?: No Do you have difficulty maintaining continence: No Do you need assistance with getting out of bed/getting out of a chair/moving?: Yes (ambulates with a cane) Do you have difficulty managing or taking your medications?: Yes (patient's sister and [name] manage the med regime)  Follow up appointments reviewed: PCP Follow-up appointment confirmed?: Yes Date of PCP follow-up appointment?: 01/16/24 Follow-up Provider: Robyne [name] Specialist [facility] Follow-up appointment confirmed?: Yes Date of Specialist follow-up appointment?: 01/08/24 Follow-Up Specialty Provider:: GI Do you need transportation to your follow-up appointment?: No Do you understand care options if your condition(s) worsen?: Yes-patient verbalized understanding    SIGNATURE Joshua Peers, RN

## 2024-01-01 ENCOUNTER — Telehealth: Payer: Self-pay | Admitting: Internal Medicine

## 2024-01-01 LAB — PHENYTOIN LEVEL, FREE AND TOTAL
Phenytoin, Free: 1.4 ug/mL (ref 1.0–2.0)
Phenytoin, Total: 20.8 ug/mL — ABNORMAL HIGH (ref 10.0–20.0)

## 2024-01-01 LAB — LEVETIRACETAM LEVEL: Levetiracetam Lvl: 30 ug/mL (ref 10.0–40.0)

## 2024-01-01 NOTE — Telephone Encounter (Signed)
 Patient was discharged from hospital 2 days ago.  I receive results of blood cultures.  1 of 2 sets growing Clostridium perfringens.  I tried to reach patient's caregiver, his nephew [name] this a.m with phone # on chart - [phone].  VM is a phone.  I did not leave a message. Will try again later.  Message sent to dischg physician and ID for advise.

## 2024-01-01 NOTE — Telephone Encounter (Signed)
-----   Message from Raymondo Band sent at 01/01/2024 10:24 AM EDT ----- Regarding: RE: Positive Clostridium perfringens blood culture I think that's reasonable ----- Message ----- From: Deanna Artis, DO Sent: 01/01/2024   9:56 AM EDT To: Marcine Matar, MD; Raymondo Band, MD Subject: RE: Positive Clostridium perfringens blood c#  Good morning. That's an odd result for sure. It's only one bottle of the group that I can see. Differentials include insidious GI malignancy or contaminant. He did not appear septic with clostridium, and his dehydration resolved quite quickly. Flu positive but not hypoxic.  If he appears ill, febrile, etc, then I would definitely have him return to the ED. If he appears well, them close monitoring and investigation of his colonoscopy history may be appropriate. If his other blood culture return positive as well, then I would send him back to the ED.  ----- Message ----- From: Marcine Matar, MD Sent: 01/01/2024   9:43 AM EDT To: Raymondo Band, MD; Deanna Artis, DO Subject: Positive Clostridium perfringens blood cultu#  Good morning Dr. Sharlene Dory.  I am the PCP for this patient whom you dischg from hospital on 12/30/2023. Pt was admitted with acute metabolic encephalopathy, dehydration and influenza A.   I received an in basket results this morning that one of his blood cultures was growing Clostridium perfringens.  Do we need to send him back to the hospital?  I have not been able to reach patient's caregiver this morning.

## 2024-01-01 NOTE — Telephone Encounter (Signed)
 Ths is what was said when you spoke with nephew yesterday or today?  Did you use # listed as [phone]?

## 2024-01-02 ENCOUNTER — Telehealth: Payer: Self-pay | Admitting: Internal Medicine

## 2024-01-02 NOTE — Telephone Encounter (Signed)
 Judie Grieve Lirette says he is going to get a thermometer and report back if patient has any symptoms. Verbalized understanding of message.

## 2024-01-02 NOTE — Telephone Encounter (Signed)
 Phone call placed today to patient's caregiver [name] using phone number on chart [phone].  Left voicemail message informing that I was calling from [facility] for [facility]  Request that he gives a call back.  If [name] calls back, please advise that I was calling to check on the patient [name] because one of his blood cultures from hospitalization came back growing bacteria.  I wanted to know if patient is doing okay, any fever.  If patient is not doing well or has fever, he should be return to the emergency room.  If he is doing well, we will monitor and have him keep his hospital follow-up appointment with [place] later this month.

## 2024-01-04 LAB — CULTURE, BLOOD (ROUTINE X 2)
Culture: NO GROWTH
Special Requests: ADEQUATE

## 2024-01-06 NOTE — Telephone Encounter (Unsigned)
 Copied from CRM [id number]. Topic: Clinical - Home Health Verbal Orders >> Jan 06, 2024  1:25 PM Gildardo Pounds wrote: Caller/Agency: Brett Canales with [facility] Callback Number: [phone] confidential CM Service Requested: Physical Therapy, needs vision checked Frequency: once a week for 4 weeks Any new concerns about the patient? No

## 2024-01-06 NOTE — Telephone Encounter (Signed)
 Can I give verbal order to approve this?  Last seen 08/2023.  Has appt next week with Marylene Land for hosp f/u.

## 2024-01-08 ENCOUNTER — Ambulatory Visit: Attending: Physician Assistant | Admitting: Physician Assistant

## 2024-01-08 ENCOUNTER — Encounter: Payer: Self-pay | Admitting: Physician Assistant

## 2024-01-08 ENCOUNTER — Ambulatory Visit: Payer: Medicare HMO | Admitting: Internal Medicine

## 2024-01-08 VITALS — BP 127/81 | HR 87 | Wt 161.6 lb

## 2024-01-08 DIAGNOSIS — Z09 Encounter for follow-up examination after completed treatment for conditions other than malignant neoplasm: Secondary | ICD-10-CM | POA: Diagnosis not present

## 2024-01-08 DIAGNOSIS — R160 Hepatomegaly, not elsewhere classified: Secondary | ICD-10-CM | POA: Diagnosis not present

## 2024-01-08 DIAGNOSIS — R569 Unspecified convulsions: Secondary | ICD-10-CM

## 2024-01-08 DIAGNOSIS — J101 Influenza due to other identified influenza virus with other respiratory manifestations: Secondary | ICD-10-CM

## 2024-01-08 MED ORDER — PHENYTOIN SODIUM EXTENDED 100 MG PO CAPS: 200.0000 mg | ORAL_CAPSULE | Freq: Every day | ORAL | 1 refills | Status: DC

## 2024-01-08 NOTE — Progress Notes (Signed)
 Patient ID: [name], male   DOB: 09-13-1951, 73 y.o.   MRN: [id number]     [name], is a 73 y.o. male  [name]  [name]  DOB - Aug 12, 1951  Chief Complaint  Patient presents with   Hospitalization Follow-up    Patient has been having a bad cough        Subjective:   [name] is a 73 y.o. male here today for a follow up visit  after being hospitalized for influenza A.  On Ct abdomen there was a finding of liver mass and recommended  MRI with and without contrast for better views.  He is doing well.  Cough has cleared.  He denies N/V/D.  No fevers.  Feeling much better overall.  His nephew is with him and he had also gotten flu.  Need sRf on new dose dilantin(200mg  at bedtime)   3/9-3/07/2024 From discharge summary: Discharge Diagnoses: Principal Problem:   Acute metabolic encephalopathy Active Problems:   Thrombocytopenia (HCC)   Liver mass   Influenza A   Seizure disorder (HCC)   Dementia [facility])   Hospital Course: 73 y.o. male with medical history significant of seizures, dementia, alcoholism, primary biliary cirrhosis, chronic hepatitis internal hemorrhoids, peripheral neuropathy, seizure disorder, history of stroke, history of hemiparesis who was brought to the emergency department due to having a seizure, found to have influenza A.    Assessment and Plan:   Acute metabolic encephalopathy - Likely postictal.  Exacerbated by dehydration and influenza A.  Appears to be resolved at this time.   Acute influenza A - IV fluid hydration initiated while inpatient.  Initiated on Tamiflu twice daily.  Will give prescription for remaining Tamiflu.  Patient not hypoxic.  Mucinex prescription provided.   Seizure disorder - Likely exacerbated by dehydration and influenza A.  No seizure appreciated while inpatient.  Resume home antiepileptic medication regiment.   History of CVA with left hemiparesis and physical debilitation - Evaluated by physical  therapy.  Recommending home health PT.  Patient has family support at home.   Incidental liver mass - 2.2 cm mass noted incidentally on CT abdomen.  Recommending nonemergent outpatient MRI with and without contrast.  This can be scheduled with PCP after patient's influenza resolved. No problems updated.  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 Authorizing Provider  aspirin EC 81 MG tablet Take 1 tablet (81 mg total) by mouth daily. 03/04/20  Yes Marcine Matar, MD  aspirin EC 81 MG tablet Take 81 mg by mouth daily. Swallow whole.   Yes [provider]  guaiFENesin (MUCINEX) 600 MG 12 hr tablet Take 1 tablet (600 mg total) by mouth 2 (two) times daily. 12/30/23  Yes Deanna Artis, DO  Lacosamide 100 MG TABS Take 1 tablet (100 mg total) by mouth in the morning and at bedtime. 11/22/23 05/20/24 Yes Windell Norfolk, MD  Lacosamide 100 MG TABS Take 100 mg by mouth in the morning and at bedtime. 12/24/23  Yes [provider]  levETIRAcetam (KEPPRA) 750 MG tablet Take 2 tablets (1,500 mg total) by mouth 2 (two) times daily. 11/27/23  Yes Marcine Matar, MD  levETIRAcetam (KEPPRA) 750 MG tablet Take 1,500  mg by mouth 2 (two) times daily. 11/27/23  Yes [provider]  memantine (NAMENDA) 10 MG tablet Take 1 tablet (10 mg total) by mouth 2 (two) times daily. 11/27/23  Yes Marcine Matar, MD  memantine (NAMENDA) 10 MG tablet Take 10 mg by mouth 2 (two) times daily. 09/24/23  Yes [provider]  ondansetron (ZOFRAN) 4 MG tablet Take 1 tablet (4 mg total) by mouth every 6 (six) hours as needed for nausea. 12/30/23  Yes Deanna Artis, DO  oseltamivir (TAMIFLU) 75 MG capsule Take 1 capsule (75 mg total) by mouth 2 (two) times daily. 12/30/23  Yes Deanna Artis, DO  pantoprazole (PROTONIX) 40 MG tablet Take 1 tablet (40 mg total) by mouth daily. 11/27/23  Yes Marcine Matar, MD  pantoprazole (PROTONIX) 40 MG tablet Take 40 mg by mouth daily. 09/24/23  Yes [provider]  phenytoin (DILANTIN) 100 MG ER capsule Take 200 mg by mouth at bedtime. 09/23/23  Yes [provider]  PHENYTOIN INFATABS 50 MG tablet Chew 0.5 tablets (25 mg total) by mouth at bedtime. 11/27/23  Yes Marcine Matar, MD  PHENYTOIN INFATABS 50 MG tablet Chew 25 mg by mouth at bedtime. 11/27/23  Yes [provider]  rosuvastatin (CRESTOR) 10 MG tablet Take 1 tablet (10 mg total) by mouth daily. STOP Simvastatin 11/27/23  Yes Marcine Matar, MD  rosuvastatin (CRESTOR) 10 MG tablet Take 10 mg by mouth daily. 11/15/23  Yes [provider]  phenytoin (DILANTIN) 100 MG ER capsule Take 2 capsules (200 mg total) by mouth at bedtime. FOR SEIZURES 01/08/24   [name], Joshua Schlein, PA-C    ROS: Neg HEENT Neg resp Neg cardiac Neg GI Neg GU Neg MS Neg psych Neg neuro  Objective:   Vitals:   01/08/24 1514  BP: 127/81  Pulse: 87  SpO2: 96%  Weight: 161 lb 9.6 oz (73.3 kg)   Exam General appearance : Awake, alert, not in any distress. Speech Clear. Not toxic looking HEENT: Atraumatic and Normocephalic, pupils equally reactive to light and accomodation Neck: Supple, no JVD. No  cervical lymphadenopathy.  Chest: Good air entry bilaterally, CTAB.  No rales/rhonchi/wheezing CVS: S1 S2 regular, no murmurs.  Extremities: B/L Lower Ext shows no edema, both legs are warm to touch Neurology: Awake alert, and oriented X 3, CN II-XII intact, Non focal Skin: No Rash  Data Review Lab Results  Component Value Date   HGBA1C 5.3 05/23/2016    Assessment & Plan   1. Seizure (HCC) Stable on meds-new dose - phenytoin (DILANTIN) 100 MG ER capsule; Take 2 capsules (200 mg total) by mouth at bedtime. FOR SEIZURES  Dispense: 180 capsule; Refill: 1  2. Influenza A (Primary) resolved  3. Hospital discharge follow-up  4. Liver mass - MR Abdomen W Wo Contrast; Future    Return  in about 3 months (around 04/09/2024) for May appt with Dr Laural Benes.  The patient was given clear instructions to go to ER or return to medical center if symptoms don't improve, worsen or new problems develop. The patient verbalized understanding. The patient was told to call to get lab results if they haven't heard anything in the next week.      Georgian Co, PA-C [facility] [place], [place] [phone]   01/08/2024, 4:02 PM

## 2024-01-09 ENCOUNTER — Telehealth: Payer: Self-pay

## 2024-01-09 ENCOUNTER — Other Ambulatory Visit: Payer: Self-pay

## 2024-01-09 ENCOUNTER — Inpatient Hospital Stay (HOSPITAL_COMMUNITY)
Admission: EM | Admit: 2024-01-09 | Discharge: 2024-01-12 | DRG: 871 | Disposition: A | Discharge status: Home Health | Attending: Internal Medicine | Admitting: Internal Medicine

## 2024-01-09 ENCOUNTER — Emergency Department (HOSPITAL_COMMUNITY)

## 2024-01-09 DIAGNOSIS — Z9981 Dependence on supplemental oxygen: Secondary | ICD-10-CM

## 2024-01-09 DIAGNOSIS — Z87891 Personal history of nicotine dependence: Secondary | ICD-10-CM

## 2024-01-09 DIAGNOSIS — J189 Pneumonia, unspecified organism: Secondary | ICD-10-CM | POA: Diagnosis not present

## 2024-01-09 DIAGNOSIS — J9601 Acute respiratory failure with hypoxia: Secondary | ICD-10-CM | POA: Diagnosis not present

## 2024-01-09 DIAGNOSIS — I69398 Other sequelae of cerebral infarction: Secondary | ICD-10-CM | POA: Diagnosis not present

## 2024-01-09 DIAGNOSIS — N289 Disorder of kidney and ureter, unspecified: Secondary | ICD-10-CM

## 2024-01-09 DIAGNOSIS — J690 Pneumonitis due to inhalation of food and vomit: Principal | ICD-10-CM | POA: Diagnosis present

## 2024-01-09 DIAGNOSIS — G9389 Other specified disorders of brain: Secondary | ICD-10-CM | POA: Diagnosis not present

## 2024-01-09 DIAGNOSIS — N179 Acute kidney failure, unspecified: Secondary | ICD-10-CM | POA: Diagnosis present

## 2024-01-09 DIAGNOSIS — Z8249 Family history of ischemic heart disease and other diseases of the circulatory system: Secondary | ICD-10-CM | POA: Diagnosis not present

## 2024-01-09 DIAGNOSIS — R509 Fever, unspecified: Secondary | ICD-10-CM | POA: Diagnosis not present

## 2024-01-09 DIAGNOSIS — Z833 Family history of diabetes mellitus: Secondary | ICD-10-CM | POA: Diagnosis not present

## 2024-01-09 DIAGNOSIS — I69354 Hemiplegia and hemiparesis following cerebral infarction affecting left non-dominant side: Secondary | ICD-10-CM | POA: Diagnosis not present

## 2024-01-09 DIAGNOSIS — E860 Dehydration: Secondary | ICD-10-CM | POA: Diagnosis not present

## 2024-01-09 DIAGNOSIS — J984 Other disorders of lung: Secondary | ICD-10-CM | POA: Diagnosis not present

## 2024-01-09 DIAGNOSIS — G40909 Epilepsy, unspecified, not intractable, without status epilepticus: Secondary | ICD-10-CM | POA: Diagnosis present

## 2024-01-09 DIAGNOSIS — Z1152 Encounter for screening for COVID-19: Secondary | ICD-10-CM | POA: Diagnosis not present

## 2024-01-09 DIAGNOSIS — R1111 Vomiting without nausea: Secondary | ICD-10-CM | POA: Diagnosis not present

## 2024-01-09 DIAGNOSIS — R739 Hyperglycemia, unspecified: Secondary | ICD-10-CM

## 2024-01-09 DIAGNOSIS — Z7982 Long term (current) use of aspirin: Secondary | ICD-10-CM | POA: Diagnosis not present

## 2024-01-09 DIAGNOSIS — R569 Unspecified convulsions: Secondary | ICD-10-CM

## 2024-01-09 DIAGNOSIS — R000 Tachycardia, unspecified: Secondary | ICD-10-CM | POA: Diagnosis not present

## 2024-01-09 DIAGNOSIS — Z79899 Other long term (current) drug therapy: Secondary | ICD-10-CM | POA: Diagnosis not present

## 2024-01-09 DIAGNOSIS — R160 Hepatomegaly, not elsewhere classified: Secondary | ICD-10-CM | POA: Diagnosis present

## 2024-01-09 DIAGNOSIS — R0902 Hypoxemia: Secondary | ICD-10-CM | POA: Diagnosis not present

## 2024-01-09 DIAGNOSIS — R413 Other amnesia: Secondary | ICD-10-CM | POA: Diagnosis present

## 2024-01-09 DIAGNOSIS — B182 Chronic viral hepatitis C: Secondary | ICD-10-CM | POA: Diagnosis present

## 2024-01-09 DIAGNOSIS — I69359 Hemiplegia and hemiparesis following cerebral infarction affecting unspecified side: Secondary | ICD-10-CM | POA: Diagnosis not present

## 2024-01-09 DIAGNOSIS — R197 Diarrhea, unspecified: Secondary | ICD-10-CM | POA: Diagnosis not present

## 2024-01-09 DIAGNOSIS — I672 Cerebral atherosclerosis: Secondary | ICD-10-CM | POA: Diagnosis not present

## 2024-01-09 DIAGNOSIS — Z96642 Presence of left artificial hip joint: Secondary | ICD-10-CM | POA: Diagnosis present

## 2024-01-09 DIAGNOSIS — A419 Sepsis, unspecified organism: Secondary | ICD-10-CM | POA: Diagnosis not present

## 2024-01-09 DIAGNOSIS — K219 Gastro-esophageal reflux disease without esophagitis: Secondary | ICD-10-CM | POA: Diagnosis not present

## 2024-01-09 DIAGNOSIS — F0390 Unspecified dementia without behavioral disturbance: Secondary | ICD-10-CM | POA: Diagnosis present

## 2024-01-09 LAB — CBC WITH DIFFERENTIAL/PLATELET
Abs Immature Granulocytes: 0.1 10*3/uL — ABNORMAL HIGH (ref 0.00–0.07)
Basophils Absolute: 0 10*3/uL (ref 0.0–0.1)
Basophils Relative: 0 %
Eosinophils Absolute: 0 10*3/uL (ref 0.0–0.5)
Eosinophils Relative: 0 %
HCT: 40.2 % (ref 39.0–52.0)
Hemoglobin: 14 g/dL (ref 13.0–17.0)
Immature Granulocytes: 1 %
Lymphocytes Relative: 3 %
Lymphs Abs: 0.6 10*3/uL — ABNORMAL LOW (ref 0.7–4.0)
MCH: 32.6 pg (ref 26.0–34.0)
MCHC: 34.8 g/dL (ref 30.0–36.0)
MCV: 93.7 fL (ref 80.0–100.0)
Monocytes Absolute: 0.8 10*3/uL (ref 0.1–1.0)
Monocytes Relative: 4 %
Neutro Abs: 17.2 10*3/uL — ABNORMAL HIGH (ref 1.7–7.7)
Neutrophils Relative %: 92 %
Platelets: 264 10*3/uL (ref 150–400)
RBC: 4.29 MIL/uL (ref 4.22–5.81)
RDW: 12.5 % (ref 11.5–15.5)
WBC: 18.6 10*3/uL — ABNORMAL HIGH (ref 4.0–10.5)
nRBC: 0 % (ref 0.0–0.2)

## 2024-01-09 LAB — BASIC METABOLIC PANEL
Anion gap: 10 (ref 5–15)
BUN: 22 mg/dL (ref 8–23)
CO2: 23 mmol/L (ref 22–32)
Calcium: 9.1 mg/dL (ref 8.9–10.3)
Chloride: 105 mmol/L (ref 98–111)
Creatinine, Ser: 1.26 mg/dL — ABNORMAL HIGH (ref 0.61–1.24)
GFR, Estimated: >60 mL/min (ref >60)
Glucose, Bld: 141 mg/dL — ABNORMAL HIGH (ref 70–99)
Potassium: 4.5 mmol/L (ref 3.5–5.1)
Sodium: 138 mmol/L (ref 135–145)

## 2024-01-09 LAB — I-STAT CG4 LACTIC ACID, ED: Lactic Acid, Venous: 1.9 mmol/L (ref 0.5–1.9)

## 2024-01-09 LAB — CBG MONITORING, ED: Glucose-Capillary: 148 mg/dL — ABNORMAL HIGH (ref 70–99)

## 2024-01-09 LAB — PHENYTOIN LEVEL, TOTAL: Phenytoin Lvl: 10.2 ug/mL (ref 10.0–20.0)

## 2024-01-09 MED ORDER — SODIUM CHLORIDE 0.9 % IV SOLN
2.0000 g | Freq: Once | INTRAVENOUS | Status: AC
  Administered 2024-01-10: 2 g via INTRAVENOUS
  Filled 2024-01-09: qty 20

## 2024-01-09 MED ORDER — AZITHROMYCIN 500 MG IV SOLR
500.0000 mg | Freq: Once | INTRAVENOUS | Status: AC
  Administered 2024-01-10: 500 mg via INTRAVENOUS
  Filled 2024-01-09: qty 5

## 2024-01-09 NOTE — ED Notes (Signed)
 Patient transported to CT

## 2024-01-09 NOTE — Telephone Encounter (Signed)
 Copied from CRM [id number]. Topic: Clinical - Home Health Verbal Orders >> Jan 07, 2024  3:46 PM Jannette Spanner L wrote: Brett Canales with [facility] was following up on his verbal orders request.   The verbal orders request: Caller/Agency: Brett Canales with [facility]  Callback Number: [phone] confidential CM Service Requested: Physical Therapy, needs vision checked Frequency: once a week for 4 weeks Any new concerns about the patient? No

## 2024-01-09 NOTE — ED Provider Notes (Signed)
 Care assumed from Dr. Rubin Payor, patient with fever, hypoxia, possible seizure. Work-up initiated, will need admission once labs and x-rays are back.  Chest x-ray shows patchy bibasilar airspace disease consistent with pneumonia-possibly aspiration.  CT of head shows no acute intracranial process.  Have independently viewed the images, and agree with the radiologist's interpretation.  I reviewed his laboratory test, my interpretation is negative PCR for COVID-19 and influenza and RSV, therapeutic phenytoin level, elevated random glucose level, mildly elevated creatinine which had been present previously, leukocytosis consistent with infection.  He has received appropriate antibiotics.  I have discussed case with Dr. Toniann Fail of [facility], who agrees to admit the patient.  Results for orders placed or performed during the hospital encounter of 01/09/24  CBG monitoring, ED   Collection Time: 01/09/24 10:04 PM  Result Value Ref Range   Glucose-Capillary 148 (H) 70 - 99 mg/dL   Comment 1 Notify RN   Resp panel by RT-PCR (RSV, Flu A&B, Covid) Anterior Nasal Swab   Collection Time: 01/09/24 11:14 PM   Specimen: Anterior Nasal Swab  Result Value Ref Range   SARS Coronavirus 2 by RT PCR NEGATIVE NEGATIVE   Influenza A by PCR NEGATIVE NEGATIVE   Influenza B by PCR NEGATIVE NEGATIVE   Resp Syncytial Virus by PCR NEGATIVE NEGATIVE  Basic metabolic panel   Collection Time: 01/09/24 11:14 PM  Result Value Ref Range   Sodium 138 135 - 145 mmol/L   Potassium 4.5 3.5 - 5.1 mmol/L   Chloride 105 98 - 111 mmol/L   CO2 23 22 - 32 mmol/L   Glucose, Bld 141 (H) 70 - 99 mg/dL   BUN 22 8 - 23 mg/dL   Creatinine, Ser 1.61 (H) 0.61 - 1.24 mg/dL   Calcium 9.1 8.9 - 09.6 mg/dL   GFR, Estimated >04 >54 mL/min   Anion gap 10 5 - 15  CBC with Differential   Collection Time: 01/09/24 11:14 PM  Result Value Ref Range   WBC 18.6 (H) 4.0 - 10.5 K/uL   RBC 4.29 4.22 - 5.81 MIL/uL   Hemoglobin 14.0 13.0 -  17.0 g/dL   HCT 09.8 11.9 - 14.7 %   MCV 93.7 80.0 - 100.0 fL   MCH 32.6 26.0 - 34.0 pg   MCHC 34.8 30.0 - 36.0 g/dL   RDW 82.9 56.2 - 13.0 %   Platelets 264 150 - 400 K/uL   nRBC 0.0 0.0 - 0.2 %   Neutrophils Relative % 92 %   Neutro Abs 17.2 (H) 1.7 - 7.7 K/uL   Lymphocytes Relative 3 %   Lymphs Abs 0.6 (L) 0.7 - 4.0 K/uL   Monocytes Relative 4 %   Monocytes Absolute 0.8 0.1 - 1.0 K/uL   Eosinophils Relative 0 %   Eosinophils Absolute 0.0 0.0 - 0.5 K/uL   Basophils Relative 0 %   Basophils Absolute 0.0 0.0 - 0.1 K/uL   Immature Granulocytes 1 %   Abs Immature Granulocytes 0.10 (H) 0.00 - 0.07 K/uL  Phenytoin level, total   Collection Time: 01/09/24 11:14 PM  Result Value Ref Range   Phenytoin Lvl 10.2 10.0 - 20.0 ug/mL  I-Stat Lactic Acid   Collection Time: 01/09/24 11:41 PM  Result Value Ref Range   Lactic Acid, Venous 1.9 0.5 - 1.9 mmol/L  Urinalysis, Routine w reflex microscopic -Urine, Clean Catch   Collection Time: 01/10/24  1:01 AM  Result Value Ref Range   Color, Urine AMBER (A) YELLOW   APPearance HAZY (  A) CLEAR   Specific Gravity, Urine 1.031 (H) 1.005 - 1.030   pH 5.0 5.0 - 8.0   Glucose, UA NEGATIVE NEGATIVE mg/dL   Hgb urine dipstick NEGATIVE NEGATIVE   Bilirubin Urine NEGATIVE NEGATIVE   Ketones, ur NEGATIVE NEGATIVE mg/dL   Protein, ur 30 (A) NEGATIVE mg/dL   Nitrite NEGATIVE NEGATIVE   Leukocytes,Ua NEGATIVE NEGATIVE   RBC / HPF 0-5 0 - 5 RBC/hpf   WBC, UA 0-5 0 - 5 WBC/hpf   Bacteria, UA NONE SEEN NONE SEEN   Squamous Epithelial / HPF 0-5 0 - 5 /HPF   Mucus PRESENT    Hyaline Casts, UA PRESENT    CT Head Wo Contrast Result Date: 01/09/2024 CLINICAL DATA:  Seizure disorder EXAM: CT HEAD WITHOUT CONTRAST TECHNIQUE: Contiguous axial images were obtained from the base of the skull through the vertex without intravenous contrast. RADIATION DOSE REDUCTION: This exam was performed according to the departmental dose-optimization program which includes  automated exposure control, adjustment of the mA and/or kV according to patient size and/or use of iterative reconstruction technique. COMPARISON:  12/29/2023 FINDINGS: Brain: Chronic encephalomalacia within the right temporal and parietal lobes, with ex vacuo dilatation of the lateral ventricles. No evidence of acute infarct or hemorrhage. Lateral ventricles and midline structures are stable. No acute extra-axial fluid collections. No mass effect. Vascular: Stable atherosclerosis. Skull: Normal. Negative for fracture or focal lesion. Sinuses/Orbits: No acute finding. Stable bilateral mastoid effusions. Other: None. IMPRESSION: 1. Stable head CT, no acute intracranial process. Electronically Signed   By: Sharlet Salina M.D.   On: 01/09/2024 23:12   DG Chest 2 View Result Date: 01/09/2024 CLINICAL DATA:  Fever, seizure like activity EXAM: CHEST - 2 VIEW COMPARISON:  12/29/2023 FINDINGS: Frontal and lateral views of the chest demonstrate an unremarkable cardiac silhouette. There is patchy bibasilar airspace disease, left greater than right. No effusion or pneumothorax. No acute bony abnormalities. IMPRESSION: 1. Patchy bibasilar airspace disease, consistent with infection or aspiration. Electronically Signed   By: Sharlet Salina M.D.   On: 01/09/2024 22:45   CT Angio Chest PE W and/or Wo Contrast Result Date: 12/29/2023 CLINICAL DATA:  Pulmonary embolism (PE) suspected, low to intermediate prob, neg D-dimer; Abdominal pain, acute, nonlocalized. Postictal, history of seizures. Cough. Abdominal pain. EXAM: CT ANGIOGRAPHY CHEST CT ABDOMEN AND PELVIS WITH CONTRAST TECHNIQUE: Multidetector CT imaging of the chest was performed using the standard protocol during bolus administration of intravenous contrast. Multiplanar CT image reconstructions and MIPs were obtained to evaluate the vascular anatomy. Multidetector CT imaging of the abdomen and pelvis was performed using the standard protocol during bolus administration  of intravenous contrast. RADIATION DOSE REDUCTION: This exam was performed according to the departmental dose-optimization program which includes automated exposure control, adjustment of the mA and/or kV according to patient size and/or use of iterative reconstruction technique. CONTRAST:  OMNIPAQUE IOHEXOL 350 MG/ML SOLN COMPARISON:  Chest radiograph from earlier today. 05/01/2017 CT virtual colonoscopy. FINDINGS: CTA CHEST FINDINGS Cardiovascular: The study is moderate quality for the evaluation of pulmonary embolism, degraded by motion and streak artifact. There are no convincing filling defects in the central, lobar, segmental or subsegmental pulmonary artery branches to suggest acute pulmonary embolism. Atherosclerotic nonaneurysmal thoracic aorta. Normal caliber pulmonary arteries. Normal heart size. No significant pericardial fluid/thickening. Left anterior descending coronary atherosclerosis. Mediastinum/Nodes: No significant thyroid nodules. Unremarkable esophagus. No pathologically enlarged axillary, mediastinal or hilar lymph nodes. Lungs/Pleura: No pneumothorax. No pleural effusion. Mild paraseptal and centrilobular emphysema. No acute consolidative  airspace disease, lung masses or significant pulmonary nodules. Mildly thickened curvilinear parenchymal band in the peripheral left lower lobe compatible with nonspecific postinfectious/postinflammatory scarring. Musculoskeletal: No aggressive appearing focal osseous lesions. Mild thoracic spondylosis. Review of the MIP images confirms the above findings. CT ABDOMEN and PELVIS FINDINGS Substantially motion degraded scan, limiting assessment. Hepatobiliary: Segment 8 right liver 2.2 x 1.7 cm mass with apparent targetoid enhancement (series 3/image 14), not definitely seen on prior noncontrast CT images from 05/01/2017. Hypodense 1.4 cm anterior segment 2 left liver lesion (series 3/image 18), not definitely seen on prior noncontrast CT. Subcentimeter  hypodense peripheral right liver lesion is too small to characterize and unchanged from 2018, considered benign. Normal gallbladder with no radiopaque cholelithiasis. No biliary ductal dilatation. Pancreas: Normal, with no mass or duct dilation. Spleen: Normal size. No mass. Adrenals/Urinary Tract: Normal adrenals. Subcentimeter hypodense upper right renal cortical lesion, too small to characterize, for which no follow-up imaging is recommended. No hydronephrosis. Normal bladder. Stomach/Bowel: Small hiatal hernia. Otherwise normal nondistended stomach. Normal caliber small bowel with no small bowel wall thickening. Normal appendix. Normal large bowel with no diverticulosis, large bowel wall thickening or pericolonic fat stranding. Vascular/Lymphatic: Atherosclerotic nonaneurysmal abdominal aorta. Patent portal, splenic, hepatic and renal veins. No pathologically enlarged lymph nodes in the abdomen or pelvis. Reproductive: Top-normal size prostate. Other: No pneumoperitoneum, ascites or focal fluid collection. Musculoskeletal: No aggressive appearing focal osseous lesions. Left total hip arthroplasty. Mild lumbar spondylosis. Review of the MIP images confirms the above findings. IMPRESSION: 1. Substantially motion degraded scan, limiting assessment. 2. No evidence of acute pulmonary embolism. No acute abnormality in the chest. 3. Indeterminate segment 8 right liver 2.2 cm mass with apparent targetoid enhancement and hypodense 1.4 cm anterior segment 2 left liver lesion, not definitely seen on prior noncontrast CT from 2018. Malignancy not excluded. Recommend further evaluation with nonemergent abdominal MRI without and with IV contrast. 4. One vessel coronary atherosclerosis. 5. Small hiatal hernia. 6. Aortic Atherosclerosis (ICD10-I70.0) and Emphysema (ICD10-J43.9). Electronically Signed   By: Delbert Phenix M.D.   On: 12/29/2023 10:03   CT ABDOMEN PELVIS W CONTRAST Result Date: 12/29/2023 CLINICAL DATA:  Pulmonary  embolism (PE) suspected, low to intermediate prob, neg D-dimer; Abdominal pain, acute, nonlocalized. Postictal, history of seizures. Cough. Abdominal pain. EXAM: CT ANGIOGRAPHY CHEST CT ABDOMEN AND PELVIS WITH CONTRAST TECHNIQUE: Multidetector CT imaging of the chest was performed using the standard protocol during bolus administration of intravenous contrast. Multiplanar CT image reconstructions and MIPs were obtained to evaluate the vascular anatomy. Multidetector CT imaging of the abdomen and pelvis was performed using the standard protocol during bolus administration of intravenous contrast. RADIATION DOSE REDUCTION: This exam was performed according to the departmental dose-optimization program which includes automated exposure control, adjustment of the mA and/or kV according to patient size and/or use of iterative reconstruction technique. CONTRAST:  OMNIPAQUE IOHEXOL 350 MG/ML SOLN COMPARISON:  Chest radiograph from earlier today. 05/01/2017 CT virtual colonoscopy. FINDINGS: CTA CHEST FINDINGS Cardiovascular: The study is moderate quality for the evaluation of pulmonary embolism, degraded by motion and streak artifact. There are no convincing filling defects in the central, lobar, segmental or subsegmental pulmonary artery branches to suggest acute pulmonary embolism. Atherosclerotic nonaneurysmal thoracic aorta. Normal caliber pulmonary arteries. Normal heart size. No significant pericardial fluid/thickening. Left anterior descending coronary atherosclerosis. Mediastinum/Nodes: No significant thyroid nodules. Unremarkable esophagus. No pathologically enlarged axillary, mediastinal or hilar lymph nodes. Lungs/Pleura: No pneumothorax. No pleural effusion. Mild paraseptal and centrilobular emphysema. No acute consolidative airspace  disease, lung masses or significant pulmonary nodules. Mildly thickened curvilinear parenchymal band in the peripheral left lower lobe compatible with nonspecific  postinfectious/postinflammatory scarring. Musculoskeletal: No aggressive appearing focal osseous lesions. Mild thoracic spondylosis. Review of the MIP images confirms the above findings. CT ABDOMEN and PELVIS FINDINGS Substantially motion degraded scan, limiting assessment. Hepatobiliary: Segment 8 right liver 2.2 x 1.7 cm mass with apparent targetoid enhancement (series 3/image 14), not definitely seen on prior noncontrast CT images from 05/01/2017. Hypodense 1.4 cm anterior segment 2 left liver lesion (series 3/image 18), not definitely seen on prior noncontrast CT. Subcentimeter hypodense peripheral right liver lesion is too small to characterize and unchanged from 2018, considered benign. Normal gallbladder with no radiopaque cholelithiasis. No biliary ductal dilatation. Pancreas: Normal, with no mass or duct dilation. Spleen: Normal size. No mass. Adrenals/Urinary Tract: Normal adrenals. Subcentimeter hypodense upper right renal cortical lesion, too small to characterize, for which no follow-up imaging is recommended. No hydronephrosis. Normal bladder. Stomach/Bowel: Small hiatal hernia. Otherwise normal nondistended stomach. Normal caliber small bowel with no small bowel wall thickening. Normal appendix. Normal large bowel with no diverticulosis, large bowel wall thickening or pericolonic fat stranding. Vascular/Lymphatic: Atherosclerotic nonaneurysmal abdominal aorta. Patent portal, splenic, hepatic and renal veins. No pathologically enlarged lymph nodes in the abdomen or pelvis. Reproductive: Top-normal size prostate. Other: No pneumoperitoneum, ascites or focal fluid collection. Musculoskeletal: No aggressive appearing focal osseous lesions. Left total hip arthroplasty. Mild lumbar spondylosis. Review of the MIP images confirms the above findings. IMPRESSION: 1. Substantially motion degraded scan, limiting assessment. 2. No evidence of acute pulmonary embolism. No acute abnormality in the chest. 3.  Indeterminate segment 8 right liver 2.2 cm mass with apparent targetoid enhancement and hypodense 1.4 cm anterior segment 2 left liver lesion, not definitely seen on prior noncontrast CT from 2018. Malignancy not excluded. Recommend further evaluation with nonemergent abdominal MRI without and with IV contrast. 4. One vessel coronary atherosclerosis. 5. Small hiatal hernia. 6. Aortic Atherosclerosis (ICD10-I70.0) and Emphysema (ICD10-J43.9). Electronically Signed   By: Delbert Phenix M.D.   On: 12/29/2023 10:03   DG Chest Portable 1 View Result Date: 12/29/2023 CLINICAL DATA:  cough EXAM: PORTABLE CHEST 1 VIEW COMPARISON:  Feb 24, 2023, November 29, 2013 FINDINGS: The cardiomediastinal silhouette is unchanged in contour.Similar elevation of the LEFT hemidiaphragm with signs of underlying LEFT-sided volume loss. No pleural effusion. No pneumothorax. Asymmetric reticulonodular opacities throughout the LEFT lung, mildly increased since prior. More focal heterogeneous opacity along the LEFT lung base, similar comparison to priors. IMPRESSION: 1. Asymmetric reticulonodular opacities throughout the LEFT lung, mildly increased since prior. This could reflect aspiration, atelectasis or atypical infection superimposed on a background of chronic interstitial lung disease or emphysema. 2. More focal heterogeneous opacity along the LEFT lung base, similar in comparison to priors. This could reflect atelectasis versus infection. 3. Similar appearance of asymmetric LEFT-sided volume loss. Given underlying emphysema, recommend evaluation for candidacy for annual lung cancer screening CT. Electronically Signed   By: Meda Klinefelter M.D.   On: 12/29/2023 08:17   CT Head Wo Contrast Result Date: 12/29/2023 CLINICAL DATA:  73 year old male found with seizure activity on toilet. EXAM: CT HEAD WITHOUT CONTRAST TECHNIQUE: Contiguous axial images were obtained from the base of the skull through the vertex without intravenous contrast.  RADIATION DOSE REDUCTION: This exam was performed according to the departmental dose-optimization program which includes automated exposure control, adjustment of the mA and/or kV according to patient size and/or use of iterative reconstruction technique. COMPARISON:  Brain MRI 07/26/2020, head CT 09/12/2023. FINDINGS: Brain: Right hemisphere encephalomalacia with ex vacuo ventricular enlargement, stable. Chronic disproportionate bilateral cerebellar volume loss also appears stable. No midline shift, acute ventriculomegaly, mass effect, evidence of mass lesion, intracranial hemorrhage or evidence of cortically based acute infarction. Stable gray-white matter differentiation throughout the brain. Vascular: Calcified atherosclerosis at the skull base. No suspicious intracranial vascular hyperdensity. Skull: Stable, intact.  Right TMJ degeneration. Sinuses/Orbits: New bilateral tympanic cavity and mastoid opacification since November. Paranasal sinuses remain well aerated. Other: No acute orbit or scalp soft tissue finding. IMPRESSION: 1. No acute intracranial abnormality. Stable non contrast CT appearance of the brain with encephalomalacia most pronounced in the right hemisphere. 2. New bilateral tympanic cavity and mastoid opacification since November, consider Acute Otitis Media with mastoid effusions. Electronically Signed   By: Odessa Fleming M.D.   On: 12/29/2023 07:50      Dione Booze, MD 01/10/24 [phone]

## 2024-01-09 NOTE — ED Provider Notes (Signed)
 [place] [facility] AT [facility] Provider Note   CSN: [id number] Arrival date & time: 01/09/24  2146     History  Chief Complaint  Patient presents with   Seizures    [name] is a 73 y.o. male.   Seizures Patient presents with reported seizure at home.  Reportedly last seizure was around a week and a half ago when he was admitted with the flu.  Reportedly compliant with medicines at home.  However developed reported fever up to 103 at home.  Mild hypoxia upon arrival.  Also reportedly has had some vomiting.  Patient does have some dementia.  Presents with family member.    Past Medical History:  Diagnosis Date   Alcoholism (HCC)    History of, not active   Chronic hepatitis C (HCC)    Dementia (HCC)    Diverticulosis    Gait disorder    Gingival hypertrophy    Secondary to Dilantin   Hemiparesis and alteration of sensations as late effects of stroke (HCC) 01/25/2017   Left hemiparesis   Hiatal hernia    History of alcoholism (HCC)    Hx of ischemic vertebrobasilar artery thalamic stroke    Right   Internal hemorrhoids    Memory disorder 03/04/2013   Primary biliary cirrhosis (HCC)    Seizures (HCC)    Stroke (HCC)    Right frontal, right thalamic   Tubular adenoma of colon    Ulnar neuropathy of left upper extremity     Home Medications Prior to Admission medications   Medication Sig Start Date End Date Taking? Authorizing Provider  aspirin EC 81 MG tablet Take 1 tablet (81 mg total) by mouth daily. 03/04/20   Marcine Matar, MD  aspirin EC 81 MG tablet Take 81 mg by mouth daily. Swallow whole.    [provider]  guaiFENesin (MUCINEX) 600 MG 12 hr tablet Take 1 tablet (600 mg total) by mouth 2 (two) times daily. 12/30/23   Deanna Artis, DO  Lacosamide 100 MG TABS Take 1 tablet (100 mg total) by mouth in the morning and at bedtime. 11/22/23 05/20/24  Windell Norfolk, MD  Lacosamide 100 MG TABS Take 100 mg by mouth in  the morning and at bedtime. 12/24/23   [provider]  levETIRAcetam (KEPPRA) 750 MG tablet Take 2 tablets (1,500 mg total) by mouth 2 (two) times daily. 11/27/23   Marcine Matar, MD  levETIRAcetam (KEPPRA) 750 MG tablet Take 1,500 mg by mouth 2 (two) times daily. 11/27/23   [provider]  memantine (NAMENDA) 10 MG tablet Take 1 tablet (10 mg total) by mouth 2 (two) times daily. 11/27/23   Marcine Matar, MD  memantine (NAMENDA) 10 MG tablet Take 10 mg by mouth 2 (two) times daily. 09/24/23   [provider]  ondansetron (ZOFRAN) 4 MG tablet Take 1 tablet (4 mg total) by mouth every 6 (six) hours as needed for nausea. 12/30/23   Deanna Artis, DO  oseltamivir (TAMIFLU) 75 MG capsule Take 1 capsule (75 mg total) by mouth 2 (two) times daily. 12/30/23   Deanna Artis, DO  pantoprazole (PROTONIX) 40 MG tablet Take 1 tablet (40 mg total) by mouth daily. 11/27/23   Marcine Matar, MD  pantoprazole (PROTONIX) 40 MG tablet Take 40 mg by mouth daily. 09/24/23   [provider]  phenytoin (DILANTIN) 100 MG ER capsule Take 2 capsules (200 mg total) by mouth at bedtime. FOR  SEIZURES 01/08/24   Anders Simmonds, PA-C  phenytoin (DILANTIN) 100 MG ER capsule Take 200 mg by mouth at bedtime. 09/23/23   [provider]  PHENYTOIN INFATABS 50 MG tablet Chew 0.5 tablets (25 mg total) by mouth at bedtime. 11/27/23   Marcine Matar, MD  PHENYTOIN INFATABS 50 MG tablet Chew 25 mg by mouth at bedtime. 11/27/23   [provider]  rosuvastatin (CRESTOR) 10 MG tablet Take 1 tablet (10 mg total) by mouth daily. STOP Simvastatin 11/27/23   Marcine Matar, MD  rosuvastatin (CRESTOR) 10 MG tablet Take 10 mg by mouth daily. 11/15/23   [provider]      Allergies    Patient has no known allergies.    Review of Systems   Review of Systems  Neurological:  Positive for seizures.    Physical Exam Updated Vital Signs BP 108/67 (BP Location: Right Arm)    Pulse (!) 112   Temp 100 F (37.8 C) (Oral)   Resp 20   SpO2 92%  Physical Exam Vitals and nursing note reviewed.  Cardiovascular:     Rate and Rhythm: Tachycardia present.  Pulmonary:     Comments: Somewhat harsh breath sounds. Abdominal:     Tenderness: There is no abdominal tenderness.  Musculoskeletal:     Cervical back: Neck supple.  Neurological:     Mental Status: He is alert. Mental status is at baseline.     Comments: Chronic left-sided weakness with some dementia.     ED Results / Procedures / Treatments   Labs (all labs ordered are listed, but only abnormal results are displayed) Labs Reviewed  CBG MONITORING, ED - Abnormal; Notable for the following components:      Result Value   Glucose-Capillary 148 (*)    All other components within normal limits  RESP PANEL BY RT-PCR (RSV, FLU A&B, COVID)  [id number]  CULTURE, BLOOD (ROUTINE X 2)  CULTURE, BLOOD (ROUTINE X 2)  SARS CORONAVIRUS 2 BY RT PCR  BASIC METABOLIC PANEL  CBC WITH DIFFERENTIAL/PLATELET  LEVETIRACETAM LEVEL  URINALYSIS, ROUTINE W REFLEX MICROSCOPIC  PHENYTOIN LEVEL, TOTAL  I-STAT CG4 LACTIC ACID, ED    EKG None  Radiology DG Chest 2 View Result Date: 01/09/2024 CLINICAL DATA:  Fever, seizure like activity EXAM: CHEST - 2 VIEW COMPARISON:  12/29/2023 FINDINGS: Frontal and lateral views of the chest demonstrate an unremarkable cardiac silhouette. There is patchy bibasilar airspace disease, left greater than right. No effusion or pneumothorax. No acute bony abnormalities. IMPRESSION: 1. Patchy bibasilar airspace disease, consistent with infection or aspiration. Electronically Signed   By: Sharlet Salina M.D.   On: 01/09/2024 22:45    Procedures Procedures    Medications Ordered in ED Medications  cefTRIAXone (ROCEPHIN) 2 g in sodium chloride 0.9 % 100 mL IVPB (has no administration in time range)  azithromycin (ZITHROMAX) 500 mg in sodium chloride 0.9 % 250 mL IVPB (has no administration in  time range)    ED Course/ Medical Decision Making/ A&P                                 Medical Decision Making Amount and/or Complexity of Data Reviewed Labs: ordered.   Patient presents after seizure activity.  Found to be febrile at home.  Has had some vomiting.  Recent admission with influenza and seizure.  Differential diagnosis does include seizure activity but also causes such as pneumonia  and gastroenteritis.  Chest x-ray does show potential pneumonia.  Does have mild hypoxia and likely require admission to the hospital.  On nasal cannula right now.  Blood work pending.  Will also get levels for Keppra and Dilantin.  Will require admission to the hospital with oxygen requirement.  Discussed with patient's nephew.  Lab work pending but care turned over to Dr. Preston Fleeting.        Final Clinical Impression(s) / ED Diagnoses Final diagnoses:  Community acquired pneumonia, unspecified laterality    Rx / DC Orders ED Discharge Orders     None         Benjiman Core, MD 01/09/24 2257

## 2024-01-09 NOTE — ED Triage Notes (Signed)
 Pt came from Home via EMS w/ c/o of possible seizure like activity. Family reports seeing full body shakes. Pt alert and responsive during triage. Hx of seizures in which he takes keppra. Hx of alzheimer's and strokes w/ left sided deficits.  Temp 100.7 treated w/ tylenol 650 4mg  of zofran for N/V CBG 164 O2 93% BP 133/68

## 2024-01-10 ENCOUNTER — Observation Stay (HOSPITAL_COMMUNITY)

## 2024-01-10 ENCOUNTER — Encounter (HOSPITAL_COMMUNITY): Payer: Self-pay | Admitting: Internal Medicine

## 2024-01-10 DIAGNOSIS — E860 Dehydration: Secondary | ICD-10-CM | POA: Diagnosis present

## 2024-01-10 DIAGNOSIS — N179 Acute kidney failure, unspecified: Secondary | ICD-10-CM | POA: Diagnosis present

## 2024-01-10 DIAGNOSIS — I69359 Hemiplegia and hemiparesis following cerebral infarction affecting unspecified side: Secondary | ICD-10-CM

## 2024-01-10 DIAGNOSIS — Z79899 Other long term (current) drug therapy: Secondary | ICD-10-CM | POA: Diagnosis not present

## 2024-01-10 DIAGNOSIS — Z96642 Presence of left artificial hip joint: Secondary | ICD-10-CM | POA: Diagnosis present

## 2024-01-10 DIAGNOSIS — J189 Pneumonia, unspecified organism: Secondary | ICD-10-CM

## 2024-01-10 DIAGNOSIS — F0390 Unspecified dementia without behavioral disturbance: Secondary | ICD-10-CM | POA: Diagnosis present

## 2024-01-10 DIAGNOSIS — R569 Unspecified convulsions: Secondary | ICD-10-CM | POA: Diagnosis present

## 2024-01-10 DIAGNOSIS — I69398 Other sequelae of cerebral infarction: Secondary | ICD-10-CM

## 2024-01-10 DIAGNOSIS — B182 Chronic viral hepatitis C: Secondary | ICD-10-CM | POA: Diagnosis present

## 2024-01-10 DIAGNOSIS — Z1152 Encounter for screening for COVID-19: Secondary | ICD-10-CM | POA: Diagnosis not present

## 2024-01-10 DIAGNOSIS — Z8249 Family history of ischemic heart disease and other diseases of the circulatory system: Secondary | ICD-10-CM | POA: Diagnosis not present

## 2024-01-10 DIAGNOSIS — R112 Nausea with vomiting, unspecified: Secondary | ICD-10-CM | POA: Diagnosis not present

## 2024-01-10 DIAGNOSIS — Z7982 Long term (current) use of aspirin: Secondary | ICD-10-CM | POA: Diagnosis not present

## 2024-01-10 DIAGNOSIS — J9601 Acute respiratory failure with hypoxia: Secondary | ICD-10-CM | POA: Diagnosis not present

## 2024-01-10 DIAGNOSIS — K219 Gastro-esophageal reflux disease without esophagitis: Secondary | ICD-10-CM | POA: Insufficient documentation

## 2024-01-10 DIAGNOSIS — Z833 Family history of diabetes mellitus: Secondary | ICD-10-CM | POA: Diagnosis not present

## 2024-01-10 DIAGNOSIS — J690 Pneumonitis due to inhalation of food and vomit: Secondary | ICD-10-CM | POA: Diagnosis present

## 2024-01-10 DIAGNOSIS — A419 Sepsis, unspecified organism: Secondary | ICD-10-CM | POA: Diagnosis present

## 2024-01-10 DIAGNOSIS — I69354 Hemiplegia and hemiparesis following cerebral infarction affecting left non-dominant side: Secondary | ICD-10-CM | POA: Diagnosis not present

## 2024-01-10 DIAGNOSIS — Z87891 Personal history of nicotine dependence: Secondary | ICD-10-CM | POA: Diagnosis not present

## 2024-01-10 DIAGNOSIS — Z9981 Dependence on supplemental oxygen: Secondary | ICD-10-CM | POA: Diagnosis not present

## 2024-01-10 DIAGNOSIS — G40909 Epilepsy, unspecified, not intractable, without status epilepticus: Secondary | ICD-10-CM | POA: Diagnosis present

## 2024-01-10 LAB — COMPREHENSIVE METABOLIC PANEL
ALT: 47 U/L — ABNORMAL HIGH (ref 0–44)
AST: 33 U/L (ref 15–41)
Albumin: 3.5 g/dL (ref 3.5–5.0)
Alkaline Phosphatase: 109 U/L (ref 38–126)
Anion gap: 6 (ref 5–15)
BUN: 21 mg/dL (ref 8–23)
CO2: 26 mmol/L (ref 22–32)
Calcium: 8.6 mg/dL — ABNORMAL LOW (ref 8.9–10.3)
Chloride: 104 mmol/L (ref 98–111)
Creatinine, Ser: 1.11 mg/dL (ref 0.61–1.24)
GFR, Estimated: >60 mL/min (ref >60)
Glucose, Bld: 119 mg/dL — ABNORMAL HIGH (ref 70–99)
Potassium: 4.2 mmol/L (ref 3.5–5.1)
Sodium: 136 mmol/L (ref 135–145)
Total Bilirubin: 0.6 mg/dL (ref 0.0–1.2)
Total Protein: 7.2 g/dL (ref 6.5–8.1)

## 2024-01-10 LAB — CBC WITH DIFFERENTIAL/PLATELET
Abs Immature Granulocytes: 0.07 10*3/uL (ref 0.00–0.07)
Basophils Absolute: 0 10*3/uL (ref 0.0–0.1)
Basophils Relative: 0 %
Eosinophils Absolute: 0 10*3/uL (ref 0.0–0.5)
Eosinophils Relative: 0 %
HCT: 39 % (ref 39.0–52.0)
Hemoglobin: 13.2 g/dL (ref 13.0–17.0)
Immature Granulocytes: 1 %
Lymphocytes Relative: 5 %
Lymphs Abs: 0.7 10*3/uL (ref 0.7–4.0)
MCH: 32.7 pg (ref 26.0–34.0)
MCHC: 33.8 g/dL (ref 30.0–36.0)
MCV: 96.5 fL (ref 80.0–100.0)
Monocytes Absolute: 0.7 10*3/uL (ref 0.1–1.0)
Monocytes Relative: 5 %
Neutro Abs: 13.7 10*3/uL — ABNORMAL HIGH (ref 1.7–7.7)
Neutrophils Relative %: 89 %
Platelets: 268 10*3/uL (ref 150–400)
RBC: 4.04 MIL/uL — ABNORMAL LOW (ref 4.22–5.81)
RDW: 12.5 % (ref 11.5–15.5)
WBC: 15.3 10*3/uL — ABNORMAL HIGH (ref 4.0–10.5)
nRBC: 0 % (ref 0.0–0.2)

## 2024-01-10 LAB — MAGNESIUM: Magnesium: 1.9 mg/dL (ref 1.7–2.4)

## 2024-01-10 LAB — URINALYSIS, ROUTINE W REFLEX MICROSCOPIC
Bacteria, UA: NONE SEEN
Bilirubin Urine: NEGATIVE
Glucose, UA: NEGATIVE mg/dL
Hgb urine dipstick: NEGATIVE
Ketones, ur: NEGATIVE mg/dL
Leukocytes,Ua: NEGATIVE
Nitrite: NEGATIVE
Protein, ur: 30 mg/dL — AB
Specific Gravity, Urine: 1.031 — ABNORMAL HIGH (ref 1.005–1.030)
pH: 5 (ref 5.0–8.0)

## 2024-01-10 LAB — RESP PANEL BY RT-PCR (RSV, FLU A&B, COVID)  RVPGX2
Influenza A by PCR: NEGATIVE
Influenza B by PCR: NEGATIVE
Resp Syncytial Virus by PCR: NEGATIVE
SARS Coronavirus 2 by RT PCR: NEGATIVE

## 2024-01-10 MED ORDER — ENOXAPARIN SODIUM 40 MG/0.4ML IJ SOSY
40.0000 mg | PREFILLED_SYRINGE | INTRAMUSCULAR | Status: DC
  Administered 2024-01-10 – 2024-01-12 (×3): 40 mg via SUBCUTANEOUS
  Filled 2024-01-10 (×3): qty 0.4

## 2024-01-10 MED ORDER — PHENYTOIN 50 MG PO CHEW
25.0000 mg | CHEWABLE_TABLET | Freq: Every day | ORAL | Status: DC
  Administered 2024-01-10 – 2024-01-11 (×2): 25 mg via ORAL
  Filled 2024-01-10 (×2): qty 0.5

## 2024-01-10 MED ORDER — PANTOPRAZOLE SODIUM 40 MG PO TBEC
40.0000 mg | DELAYED_RELEASE_TABLET | Freq: Every day | ORAL | Status: DC
Start: 2024-01-10 — End: 2024-01-12
  Administered 2024-01-10 – 2024-01-12 (×3): 40 mg via ORAL
  Filled 2024-01-10 (×3): qty 1

## 2024-01-10 MED ORDER — ASPIRIN 81 MG PO TBEC
81.0000 mg | DELAYED_RELEASE_TABLET | Freq: Every day | ORAL | Status: DC
Start: 2024-01-10 — End: 2024-01-12
  Administered 2024-01-10 – 2024-01-12 (×3): 81 mg via ORAL
  Filled 2024-01-10 (×3): qty 1

## 2024-01-10 MED ORDER — MEMANTINE HCL 10 MG PO TABS
10.0000 mg | ORAL_TABLET | Freq: Two times a day (BID) | ORAL | Status: DC
Start: 2024-01-10 — End: 2024-01-12
  Administered 2024-01-10 – 2024-01-12 (×5): 10 mg via ORAL
  Filled 2024-01-10: qty 1
  Filled 2024-01-10: qty 2
  Filled 2024-01-10 (×3): qty 1

## 2024-01-10 MED ORDER — PHENYTOIN SODIUM EXTENDED 100 MG PO CAPS
200.0000 mg | ORAL_CAPSULE | Freq: Every day | ORAL | Status: DC
Start: 2024-01-10 — End: 2024-01-12
  Administered 2024-01-10 – 2024-01-11 (×2): 200 mg via ORAL
  Filled 2024-01-10 (×2): qty 2

## 2024-01-10 MED ORDER — ROSUVASTATIN CALCIUM 10 MG PO TABS
10.0000 mg | ORAL_TABLET | Freq: Every day | ORAL | Status: DC
  Administered 2024-01-10 – 2024-01-12 (×3): 10 mg via ORAL
  Filled 2024-01-10 (×3): qty 1

## 2024-01-10 MED ORDER — AMPICILLIN-SULBACTAM SODIUM 3 (2-1) G IJ SOLR
3.0000 g | Freq: Four times a day (QID) | INTRAMUSCULAR | Status: DC
  Administered 2024-01-10 – 2024-01-12 (×8): 3 g via INTRAVENOUS
  Filled 2024-01-10 (×8): qty 8

## 2024-01-10 MED ORDER — LACTATED RINGERS IV SOLN: INTRAVENOUS | Status: AC

## 2024-01-10 MED ORDER — SODIUM CHLORIDE 0.9 % IV SOLN
2.0000 g | INTRAVENOUS | Status: DC
  Administered 2024-01-10: 2 g via INTRAVENOUS
  Filled 2024-01-10: qty 20

## 2024-01-10 MED ORDER — VANCOMYCIN HCL 1500 MG/300ML IV SOLN
1500.0000 mg | Freq: Once | INTRAVENOUS | Status: AC
  Administered 2024-01-10: 1500 mg via INTRAVENOUS
  Filled 2024-01-10: qty 300

## 2024-01-10 MED ORDER — LEVETIRACETAM 500 MG PO TABS
1500.0000 mg | ORAL_TABLET | Freq: Two times a day (BID) | ORAL | Status: DC
  Administered 2024-01-10 – 2024-01-12 (×5): 1500 mg via ORAL
  Filled 2024-01-10 (×6): qty 3

## 2024-01-10 MED ORDER — VANCOMYCIN HCL 1250 MG/250ML IV SOLN: 1250.0000 mg | INTRAVENOUS | Status: DC

## 2024-01-10 MED ORDER — LACOSAMIDE 50 MG PO TABS
150.0000 mg | ORAL_TABLET | Freq: Two times a day (BID) | ORAL | Status: DC
Start: 2024-01-10 — End: 2024-01-12
  Administered 2024-01-10 – 2024-01-12 (×5): 150 mg via ORAL
  Filled 2024-01-10 (×5): qty 3

## 2024-01-10 MED ORDER — SODIUM CHLORIDE 0.9 % IV SOLN
500.0000 mg | INTRAVENOUS | Status: DC
  Administered 2024-01-10: 500 mg via INTRAVENOUS
  Filled 2024-01-10: qty 5

## 2024-01-10 NOTE — H&P (Addendum)
 History and Physical    [name]:[id number] DOB: 12-13-1950 DOA: 01/09/2024  Patient coming from: Home.  Chief Complaint: Seizures.  HPI: [name] is a 73 y.o. male with history of seizures, CVA with left-sided hemiparesis, GERD, recent admission to the hospital for encephalopathy postictal in the setting of dehydration and influenza A discharged on 12/30/2023 was brought to the ER after patient had another seizure.  As per the nephew who provided the history patient was not doing well yesterday he had multiple episodes of vomiting but was able to eat breakfast later.  Did not have lunch because he had poor appetite.  Did not complain of any abdominal pain or diarrhea.  Later in the evening patient's nephew witnessed that patient had a generalized tonic-clonic seizure lasting for around 5 minutes but did not lose consciousness.  He checked his temperature it was around 100.4 F and EMS was called and brought to the ER.  ED Course: In the ER patient had a temperature of 100 F tachycardic and labs show WBC count of 18.6 creatinine 1.2 which increased from 0.92 weeks ago.  Chest x-ray shows bilateral infiltrates CT head unremarkable.  COVID and flu test were negative.  Patient was empirically placed on antibiotics for pneumonia which could be from aspiration.  On fluids and admitted for acute renal failure with seizures and pneumonia.  At the time of my exam patient is alert awake oriented and back to baseline.  Review of Systems: As per HPI, rest all negative.   Past Medical History:  Diagnosis Date   Alcoholism (HCC)    History of, not active   Chronic hepatitis C (HCC)    Dementia (HCC)    Diverticulosis    Gait disorder    Gingival hypertrophy    Secondary to Dilantin   Hemiparesis and alteration of sensations as late effects of stroke (HCC) 01/25/2017   Left hemiparesis   Hiatal hernia    History of alcoholism (HCC)    Hx of ischemic vertebrobasilar artery thalamic  stroke    Right   Internal hemorrhoids    Memory disorder 03/04/2013   Primary biliary cirrhosis (HCC)    Seizures (HCC)    Stroke (HCC)    Right frontal, right thalamic   Tubular adenoma of colon    Ulnar neuropathy of left upper extremity     Past Surgical History:  Procedure Laterality Date   COLONOSCOPY WITH PROPOFOL N/A 07/10/2017   Procedure: COLONOSCOPY WITH PROPOFOL;  Surgeon: Kathi Der, MD;  Location: [facility];  Service: Gastroenterology;  Laterality: N/A;   ESOPHAGOGASTRODUODENOSCOPY (EGD) WITH PROPOFOL N/A 07/10/2017   Procedure: ESOPHAGOGASTRODUODENOSCOPY (EGD) WITH PROPOFOL;  Surgeon: Kathi Der, MD;  Location: [facility];  Service: Gastroenterology;  Laterality: N/A;   HIP ARTHROPLASTY Left 11/26/2013   Procedure: LEFT HIP HEMIARTHROPLASTY;  Surgeon: Kathryne Hitch, MD;  Location: [facility] OR;  Service: Orthopedics;  Laterality: Left;   WRIST SURGERY Left 95 & 96     reports that he quit smoking about 10 years ago. His smoking use included cigarettes. He has quit using smokeless tobacco. He reports that he does not drink alcohol and does not use drugs.  No Known Allergies  Family History  Problem Relation Age of Onset   Pulmonary embolism Mother    Liver disease Father    Diabetes Sister    Hypertension Sister    Hypertension Sister    Hypertension Sister    Seizures Neg Hx     Prior  to Admission medications   Medication Sig Start Date End Date Taking? Authorizing Provider  aspirin EC 81 MG tablet Take 1 tablet (81 mg total) by mouth daily. 03/04/20   Marcine Matar, MD  aspirin EC 81 MG tablet Take 81 mg by mouth daily. Swallow whole.    [provider]  guaiFENesin (MUCINEX) 600 MG 12 hr tablet Take 1 tablet (600 mg total) by mouth 2 (two) times daily. 12/30/23   Deanna Artis, DO  Lacosamide 100 MG TABS Take 1 tablet (100 mg total) by mouth in the morning and at bedtime. 11/22/23 05/20/24  Windell Norfolk, MD  Lacosamide 100  MG TABS Take 100 mg by mouth in the morning and at bedtime. 12/24/23   [provider]  levETIRAcetam (KEPPRA) 750 MG tablet Take 2 tablets (1,500 mg total) by mouth 2 (two) times daily. 11/27/23   Marcine Matar, MD  levETIRAcetam (KEPPRA) 750 MG tablet Take 1,500 mg by mouth 2 (two) times daily. 11/27/23   [provider]  memantine (NAMENDA) 10 MG tablet Take 1 tablet (10 mg total) by mouth 2 (two) times daily. 11/27/23   Marcine Matar, MD  memantine (NAMENDA) 10 MG tablet Take 10 mg by mouth 2 (two) times daily. 09/24/23   [provider]  ondansetron (ZOFRAN) 4 MG tablet Take 1 tablet (4 mg total) by mouth every 6 (six) hours as needed for nausea. 12/30/23   Deanna Artis, DO  oseltamivir (TAMIFLU) 75 MG capsule Take 1 capsule (75 mg total) by mouth 2 (two) times daily. 12/30/23   Deanna Artis, DO  pantoprazole (PROTONIX) 40 MG tablet Take 1 tablet (40 mg total) by mouth daily. 11/27/23   Marcine Matar, MD  pantoprazole (PROTONIX) 40 MG tablet Take 40 mg by mouth daily. 09/24/23   [provider]  phenytoin (DILANTIN) 100 MG ER capsule Take 2 capsules (200 mg total) by mouth at bedtime. FOR SEIZURES 01/08/24   Anders Simmonds, PA-C  phenytoin (DILANTIN) 100 MG ER capsule Take 200 mg by mouth at bedtime. 09/23/23   [provider]  PHENYTOIN INFATABS 50 MG tablet Chew 0.5 tablets (25 mg total) by mouth at bedtime. 11/27/23   Marcine Matar, MD  PHENYTOIN INFATABS 50 MG tablet Chew 25 mg by mouth at bedtime. 11/27/23   [provider]  rosuvastatin (CRESTOR) 10 MG tablet Take 1 tablet (10 mg total) by mouth daily. STOP Simvastatin 11/27/23   Marcine Matar, MD  rosuvastatin (CRESTOR) 10 MG tablet Take 10 mg by mouth daily. 11/15/23   [provider]    Physical Exam: Constitutional: Moderately built and nourished. Vitals:   01/09/24 2201 01/10/24 0015 01/10/24 0100 01/10/24 0402  BP:   130/79 (!) 114/59  Pulse:   95 95   Resp:   (!) 23 15  Temp:  99.1 F (37.3 C)  99.2 F (37.3 C)  TempSrc:  Rectal  Oral  SpO2: 92%  100% 100%   Eyes: Anicteric no pallor. ENMT: No discharge from the ears eyes nose or mouth. Neck: No mass felt.  No neck rigidity. Respiratory: No rhonchi or crepitations. Cardiovascular: S1-S2 heard. Abdomen: Soft nontender bowel sound present. Musculoskeletal: No edema. Skin: No rash. Neurologic: Alert awake oriented to place and person mild weakness on the left side from prior stroke. Psychiatric: Appears normal.  Normal affect.   Labs on Admission: I have personally reviewed following labs and imaging studies  CBC: Recent Labs  Lab  01/09/24 2314  WBC 18.6*  NEUTROABS 17.2*  HGB 14.0  HCT 40.2  MCV 93.7  PLT 264   Basic Metabolic Panel: Recent Labs  Lab 01/09/24 2314  NA 138  K 4.5  CL 105  CO2 23  GLUCOSE 141*  BUN 22  CREATININE 1.26*  CALCIUM 9.1   GFR: Estimated Creatinine Clearance: 52 mL/min (A) (by C-G formula based on SCr of 1.26 mg/dL (H)). Liver Function Tests: No results for input(s): "AST", "ALT", "ALKPHOS", "BILITOT", "PROT", "ALBUMIN" in the last 168 hours. No results for input(s): "LIPASE", "AMYLASE" in the last 168 hours. No results for input(s): "AMMONIA" in the last 168 hours. Coagulation Profile: No results for input(s): "INR", "PROTIME" in the last 168 hours. Cardiac Enzymes: No results for input(s): "CKTOTAL", "CKMB", "CKMBINDEX", "TROPONINI" in the last 168 hours. BNP (last 3 results) No results for input(s): "PROBNP" in the last 8760 hours. HbA1C: No results for input(s): "HGBA1C" in the last 72 hours. CBG: Recent Labs  Lab 01/09/24 2204  GLUCAP 148*   Lipid Profile: No results for input(s): "CHOL", "HDL", "LDLCALC", "TRIG", "CHOLHDL", "LDLDIRECT" in the last 72 hours. Thyroid Function Tests: No results for input(s): "TSH", "T4TOTAL", "FREET4", "T3FREE", "THYROIDAB" in the last 72 hours. Anemia Panel: No results for  input(s): "VITAMINB12", "FOLATE", "FERRITIN", "TIBC", "IRON", "RETICCTPCT" in the last 72 hours. Urine analysis:    Component Value Date/Time   COLORURINE AMBER (A) 01/10/2024 0101   APPEARANCEUR HAZY (A) 01/10/2024 0101   LABSPEC 1.031 (H) 01/10/2024 0101   PHURINE 5.0 01/10/2024 0101   GLUCOSEU NEGATIVE 01/10/2024 0101   HGBUR NEGATIVE 01/10/2024 0101   BILIRUBINUR NEGATIVE 01/10/2024 0101   KETONESUR NEGATIVE 01/10/2024 0101   PROTEINUR 30 (A) 01/10/2024 0101   UROBILINOGEN 1.0 01/01/2015 0736   NITRITE NEGATIVE 01/10/2024 0101   LEUKOCYTESUR NEGATIVE 01/10/2024 0101   Sepsis Labs: @LABRCNTIP (procalcitonin:4,lacticidven:4) ) Recent Results (from the past 240 hours)  Resp panel by RT-PCR (RSV, Flu A&B, Covid) Anterior Nasal Swab     Status: None   Collection Time: 01/09/24 11:14 PM   Specimen: Anterior Nasal Swab  Result Value Ref Range Status   SARS Coronavirus 2 by RT PCR NEGATIVE NEGATIVE Final    Comment: (NOTE) SARS-CoV-2 target nucleic acids are NOT DETECTED.  The SARS-CoV-2 RNA is generally detectable in upper respiratory specimens during the acute phase of infection. The lowest concentration of SARS-CoV-2 viral copies this assay can detect is 138 copies/mL. A negative result does not preclude SARS-Cov-2 infection and should not be used as the sole basis for treatment or other patient management decisions. A negative result may occur with  improper specimen collection/handling, submission of specimen other than nasopharyngeal swab, presence of viral mutation(s) within the areas targeted by this assay, and inadequate number of viral copies(<138 copies/mL). A negative result must be combined with clinical observations, patient history, and epidemiological information. The expected result is Negative.  Fact Sheet for Patients:  BloggerCourse.com  Fact Sheet for Healthcare Providers:  SeriousBroker.it  This test is  no t yet approved or cleared by the [place] FDA and  has been authorized for detection and/or diagnosis of SARS-CoV-2 by FDA under an Emergency Use Authorization (EUA). This EUA will remain  in effect (meaning this test can be used) for the duration of the COVID-19 declaration under Section 564(b)(1) of the Act, 21 U.S.C.section 360bbb-3(b)(1), unless the authorization is terminated  or revoked sooner.       Influenza A by PCR NEGATIVE NEGATIVE Final   Influenza  B by PCR NEGATIVE NEGATIVE Final    Comment: (NOTE) The Xpert Xpress SARS-CoV-2/FLU/RSV plus assay is intended as an aid in the diagnosis of influenza from Nasopharyngeal swab specimens and should not be used as a sole basis for treatment. Nasal washings and aspirates are unacceptable for Xpert Xpress SARS-CoV-2/FLU/RSV testing.  Fact Sheet for Patients: BloggerCourse.com  Fact Sheet for Healthcare Providers: SeriousBroker.it  This test is not yet approved or cleared by the [place] FDA and has been authorized for detection and/or diagnosis of SARS-CoV-2 by FDA under an Emergency Use Authorization (EUA). This EUA will remain in effect (meaning this test can be used) for the duration of the COVID-19 declaration under Section 564(b)(1) of the Act, 21 U.S.C. section 360bbb-3(b)(1), unless the authorization is terminated or revoked.     Resp Syncytial Virus by PCR NEGATIVE NEGATIVE Final    Comment: (NOTE) Fact Sheet for Patients: BloggerCourse.com  Fact Sheet for Healthcare Providers: SeriousBroker.it  This test is not yet approved or cleared by the [place] FDA and has been authorized for detection and/or diagnosis of SARS-CoV-2 by FDA under an Emergency Use Authorization (EUA). This EUA will remain in effect (meaning this test can be used) for the duration of the COVID-19 declaration under Section  564(b)(1) of the Act, 21 U.S.C. section 360bbb-3(b)(1), unless the authorization is terminated or revoked.  Performed at [facility], [address], [place], [place] [address]      Radiological Exams on Admission: CT Head Wo Contrast Result Date: 01/09/2024 CLINICAL DATA:  Seizure disorder EXAM: CT HEAD WITHOUT CONTRAST TECHNIQUE: Contiguous axial images were obtained from the base of the skull through the vertex without intravenous contrast. RADIATION DOSE REDUCTION: This exam was performed according to the departmental dose-optimization program which includes automated exposure control, adjustment of the mA and/or kV according to patient size and/or use of iterative reconstruction technique. COMPARISON:  12/29/2023 FINDINGS: Brain: Chronic encephalomalacia within the right temporal and parietal lobes, with ex vacuo dilatation of the lateral ventricles. No evidence of acute infarct or hemorrhage. Lateral ventricles and midline structures are stable. No acute extra-axial fluid collections. No mass effect. Vascular: Stable atherosclerosis. Skull: Normal. Negative for fracture or focal lesion. Sinuses/Orbits: No acute finding. Stable bilateral mastoid effusions. Other: None. IMPRESSION: 1. Stable head CT, no acute intracranial process. Electronically Signed   By: Sharlet Salina M.D.   On: 01/09/2024 23:12   DG Chest 2 View Result Date: 01/09/2024 CLINICAL DATA:  Fever, seizure like activity EXAM: CHEST - 2 VIEW COMPARISON:  12/29/2023 FINDINGS: Frontal and lateral views of the chest demonstrate an unremarkable cardiac silhouette. There is patchy bibasilar airspace disease, left greater than right. No effusion or pneumothorax. No acute bony abnormalities. IMPRESSION: 1. Patchy bibasilar airspace disease, consistent with infection or aspiration. Electronically Signed   By: Sharlet Salina M.D.   On: 01/09/2024 22:45    EKG: Independently reviewed.  Sinus  tachycardia.  Assessment/Plan Principal Problem:   Pneumonia Active Problems:   break through seizure   Memory disorder   Hemiparesis and alteration of sensations as late effects of stroke (HCC)   Dementia (HCC)   ARF (acute renal failure) (HCC)    Pneumonia likely could be from aspiration patient had multiple episodes of vomiting early yesterday.  Given the leukocytosis and chest x-ray findings and recent influenza A we will keep patient on empiric antibiotics including vancomycin ceftriaxone and Zithromax.  Follow cultures.  COVID and flu test are negative.  Speech therapy evaluation. Breakthrough  seizure likely provoked due to fever and pneumonia and dehydration.  Discussed with on-call neurologist Dr. Curtis Sites given that patient has had 2 seizures in the last 2 weeks advised to increase lacosamide from 100 mg to 150 mg twice daily.  Continue Keppra 1500 mg twice daily and Dilantin present dose.  Dilantin levels were 10.2. Acute renal failure likely from nausea vomiting and dehydration.  Gently hydrate follow metabolic panel.  UA is largely unremarkable. Nausea vomiting cause not clear abdomen appears benign KUB pending.  Patient is requesting diet at this time.  Will advance as tolerated. History of stroke on statins and aspirin. Liver lesion noted during last admission 2 weeks ago will need follow-up. GERD on PPI. Dementia/cognitive impairment on Namenda.  Since patient has seizures with pneumonia renal failure will need close monitoring and more than 2 midnight stay.   DVT prophylaxis: Lovenox. Code Status: Full code. Family Communication: Patient's nephew. Disposition Plan: Medical floor. Consults called: Discussed with neurologist. Admission status: Observation.

## 2024-01-10 NOTE — Telephone Encounter (Signed)
 Called and left voice mail

## 2024-01-10 NOTE — ED Notes (Signed)
 Brief changed. No bm present. 2x person assist.

## 2024-01-10 NOTE — Progress Notes (Signed)
 73 year old male with history of seizures admitted with likely aspiration pneumonia.  Chart reviewed patient seen.  He is being treated with vancomycin Rocephin and azithromycin.  Will change antibiotics to Unasyn since this is likely an aspiration event.  2 seizures in the past 2 weeks, admitting physician discussed with on-call neurologist and increased lacosamide to 150 twice daily from 100 twice daily and continuing Keppra and Dilantin at the previous dose.

## 2024-01-10 NOTE — Progress Notes (Addendum)
 01/10/24 0900  SLP Visit Information  SLP Received On 01/10/24  Subjective  Subjective pt awake in bed- happy to see his tray arrived  Patient/Family Stated Goal to eat  General Information  Date of Onset 01/10/24  HPI 73 yo male adm to [facility] with N/V, h/o minimal oral some dysphagia from pror MBS study several years prior, fevers on admit,  prior right parietal temporal CVA with left HP, ETOH, GERD,  abd picture showed "Non obstructed bowel gas pattern". Pt also has h/o GERD, HH, dementia, seizures few weeks ago in setting of toxic metabolic derangement  and recently had the flu.  Chronic encephalomalacia within the right temporal and parietal lobes 3/20 . Patchy bibasilar airspace disease, consistent with infection or aspiration.  Type of Study Bedside Swallow Evaluation  Previous Swallow Assessment mbs 2000  Diet Prior to this Study Regular;Thin liquids (Level 0)  Temperature Spikes Noted No  Respiratory Status Room air  History of Recent Intubation No  Behavior/Cognition Alert;Cooperative;Other (Comment) (has dementia)  Oral Cavity Assessment WFL  Oral Care Completed by SLP No  Oral Cavity - Dentition Edentulous  Vision Functional for self-feeding  Self-Feeding Abilities Able to feed self  Patient Positioning Upright in bed  Baseline Vocal Quality Normal  Volitional Cough Strong  Volitional Swallow  (DNT)  Pain Assessment  Pain Assessment No/denies pain  Oral Assessment (Complete on admission/transfer/every shift)  Oral Assessment  (WDL) X  Lips Symmetrical  Teeth Missing (Comment)  Tongue Pink;Moist  Mucous Membrane(s) Pink;Moist  Saliva Moist, saliva free flowing  Level of Consciousness Alert  Is patient on any of following O2 devices? None of the above  Nutritional status No high risk factors  Oral Assessment Risk  Low Risk  Oral Motor/Sensory Function  Overall Oral Motor/Sensory Function  (slight facial asymmetry left)  Ice Chips  Ice chips NT  Thin Liquid  Thin  Liquid WFL  Presentation Self Fed;Straw  Nectar Thick Liquid  Nectar Thick Liquid NT  Honey Thick Liquid  Honey Thick Liquid NT  Puree  Puree WFL  Presentation Self Fed;Spoon  Solid  Solid  (subtle cough x2 during entire meal - has h/o GERD and HH)  Presentation Self Fed  SLP - End of Session  Patient left in bed;with call bell/phone within reach  Nurse Communication Diet recommendation  SLP Assessment  Clinical Impression Statement (ACUTE ONLY) Patient presents with functional oropharyngeal swallow ability based on clinical swallow evaluation.   Slight facial asymmetry noted but no overt CN deficits. Pt able to swallow multiple pills with liquids *provided by RN . His swallow subjectively appeared timely without indication of retention or airway compromise. He does have h/o GERD and a hiatal hernia and is on a PPI. Subtle cough x2 during his entire meal. Recommend continue his current diet with general precautions.   Pt only consumed approx 10% of his breakfast tray and stated he was full.   If concern is present for aspiration, suspect it is primarily GI issue and/or nausea/vomiting.    Informed RN of results.  Thanks for this order.    SLP Visit Diagnosis Dysphagia, oral phase (R13.11) h/o minimal   Impact on safety and function Mild aspiration risk  Other Related Risk Factors History of GERD;Previous CVA  Swallow Evaluation Recommendations  SLP Diet Recommendations Regular;Thin liquid  Liquid Administration via Cup;Straw  Medication Administration  (as tolerated)  Supervision Patient able to self feed  Compensations Slow rate;Small sips/bites (start intake with liquids, esophageal precautions)  Postural Changes  Seated upright at 90 degrees;Remain upright for at least 30 minutes after po intake  Treatment Plan  Oral Care Recommendations Oral care BID  Caregiver Recommendations Remove water pitcher  Treatment Recommendations No treatment recommended at this time  Follow Up  Recommendations No SLP follow up  Functional Status Assessment Patient has not had a recent decline in their functional status  Individuals Consulted  Consulted and Agree with Results and Recommendations Patient unable/family or caregiver not available  Report Sent to  Referring physician  Progression Toward Goals  Progression toward goals Goals met, education completed, patient discharged from SLP  SLP Time Calculation  SLP Start Time (ACUTE ONLY) 0907  SLP Stop Time (ACUTE ONLY) 2130  SLP Time Calculation (min) (ACUTE ONLY) 31 min  SLP Evaluations  $ SLP Speech Visit 1 Visit  SLP Evaluations  $BSS Swallow 1 Procedure  Rolena Infante, MS [facility] SLP Acute Rehab Services Office [phone]

## 2024-01-10 NOTE — Progress Notes (Signed)
 Pharmacy Antibiotic Note  [name] is a 73 y.o. male admitted on 01/09/2024 with pneumonia.  Pharmacy has been consulted for vancomcyin dosing.  Plan: Vancomycin 1500mg  x 1 then 1250mg  q24h (AUC 497.8, Scr 1.26) Follow renal function, cultures and clinical course     Temp (24hrs), Avg:99.4 F (37.4 C), Min:99.1 F (37.3 C), Max:100 F (37.8 C)  Recent Labs  Lab 01/09/24 2314 01/09/24 2341  WBC 18.6*  --   CREATININE 1.26*  --   LATICACIDVEN  --  1.9    Estimated Creatinine Clearance: 52 mL/min (A) (by C-G formula based on SCr of 1.26 mg/dL (H)).    No Known Allergies  Antimicrobials this admission: 3/21 CTX >> 3/21 azith >> 3/21 vanc >>  Dose adjustments this admission:   Microbiology results: 3/21 BCx:   Thank you for allowing pharmacy to be a part of this patient's care.  Arley Phenix RPh 01/10/2024, 5:26 AM

## 2024-01-10 NOTE — Progress Notes (Signed)
 Pharmacy: unasyn  Patient ia a 73 y.o M who presented to the ED on 01/09/24 with seizures.  Pt's family reported that he had some vomiting episodes PTA.  He was started on vancomycin, ceftriaxone and azithromycin on admission for PNA.  Pharmacy has been consulted on 01/10/24 to change abx to unasyn for asp PNA.Marland Kitchen  Plan: - unasyn 3gm IV q6h - pharmacy will sign off for abx. Re-consult [place] if need further assistance  Dorna Leitz, PharmD, BCPS 01/10/2024 11:02 AM

## 2024-01-10 NOTE — Plan of Care (Signed)
 Overnight on-call neurology note  Called by Dr. Toniann Fail regarding this patient who had breakthrough seizures in the setting of acute infection/pneumonia.  Had a similar episode a few weeks ago again in the setting of toxic metabolic derangements.  I recommend increasing the dose of lacosamide from 100 twice daily to 150 mg twice daily given that he has had multiple seizures and his seizure threshold might be lowered by acute infections.  He should continue to follow with outpatient neurology and they can down titrate medications as appropriate.  Continue Keppra and Dilantin at the current doses.  Please call inpatient neurology with questions as needed  -- Milon Dikes, MD Neurologist [facility]  No charge note

## 2024-01-11 DIAGNOSIS — J189 Pneumonia, unspecified organism: Secondary | ICD-10-CM | POA: Diagnosis not present

## 2024-01-11 LAB — BASIC METABOLIC PANEL
Anion gap: 7 (ref 5–15)
BUN: 10 mg/dL (ref 8–23)
CO2: 24 mmol/L (ref 22–32)
Calcium: 8.5 mg/dL — ABNORMAL LOW (ref 8.9–10.3)
Chloride: 105 mmol/L (ref 98–111)
Creatinine, Ser: 0.79 mg/dL (ref 0.61–1.24)
GFR, Estimated: >60 mL/min (ref >60)
Glucose, Bld: 97 mg/dL (ref 70–99)
Potassium: 4.5 mmol/L (ref 3.5–5.1)
Sodium: 136 mmol/L (ref 135–145)

## 2024-01-11 LAB — LEVETIRACETAM LEVEL: Levetiracetam Lvl: 9.8 ug/mL — ABNORMAL LOW (ref 10.0–40.0)

## 2024-01-11 MED ORDER — ENSURE ENLIVE PO LIQD
237.0000 mL | Freq: Two times a day (BID) | ORAL | Status: DC
Start: 2024-01-12 — End: 2024-01-12

## 2024-01-11 MED ORDER — LACTATED RINGERS IV SOLN: INTRAVENOUS | Status: DC

## 2024-01-11 MED ORDER — MELATONIN 5 MG PO TABS
5.0000 mg | ORAL_TABLET | Freq: Once | ORAL | Status: AC
  Administered 2024-01-11: 5 mg via ORAL
  Filled 2024-01-11: qty 1

## 2024-01-11 NOTE — Evaluation (Signed)
 Physical Therapy Evaluation Patient Details Name: [name] MRN: [id number] DOB: 1950/12/03 Today's Date: 01/11/2024  History of Present Illness  Pt is a 73yo male presenting to [facility] ED with seizure lasting , vomiting, found to have pneumonia; of note recent hospitalization for flu , negative covid and flu this stay. Requires supplemental O2. CT head negative for acute abnormalities. PMH: ETOH, chronic hep C, dementia, gait disorder, CVA with L hemiparesis, seizures, L-THA, GERD  Clinical Impression  Patient evaluated by Physical Therapy with no further acute PT needs identified. All education has been completed and the patient has no further questions. Pt demonstrated bed mobility and transfers at mod IND level for increased time; ambulated in hallway for walking O2 test (see this PT's previous note) with CGA for 55ft, no overt LOB noted nor physical assist required. Pt reports he is at his baseline. Added to mobility specialist caseload. See below for any follow-up Physical Therapy or equipment needs. PT is signing off. Thank you for this referral.        If plan is discharge home, recommend the following: A little help with walking and/or transfers;A little help with bathing/dressing/bathroom;Assistance with cooking/housework;Assist for transportation;Help with stairs or ramp for entrance   Can travel by private vehicle        Equipment Recommendations Rolling walker (2 wheels)  Recommendations for Other Services       Functional Status Assessment Patient has had a recent decline in their functional status and demonstrates the ability to make significant improvements in function in a reasonable and predictable amount of time.     Precautions / Restrictions Precautions Precautions: Fall Restrictions Weight Bearing Restrictions Per Provider Order: No      Mobility  Bed Mobility Overal bed mobility: Modified Independent Bed Mobility: Supine to Sit           General bed  mobility comments: Increased time    Transfers Overall transfer level: Modified independent Equipment used: Rolling walker (2 wheels), Straight cane               General transfer comment: Increased time to RW    Ambulation/Gait Ambulation/Gait assistance: Contact guard assist Gait Distance (Feet): 80 Feet Assistive device: Rolling walker (2 wheels), Straight cane Gait Pattern/deviations: Step-to pattern Gait velocity: decreased     General Gait Details: Pt instructed in ambulation with RW adn CGA with PT managing IVpole with O2 tank, no overt LOB noted nor physical assits required  Stairs            Wheelchair Mobility     Tilt Bed    Modified Rankin (Stroke Patients Only)       Balance Overall balance assessment: Needs assistance Sitting-balance support: Feet supported, No upper extremity supported Sitting balance-Leahy Scale: Good     Standing balance support: Bilateral upper extremity supported, During functional activity, Reliant on assistive device for balance Standing balance-Leahy Scale: Poor                               Pertinent Vitals/Pain Pain Assessment Pain Assessment: No/denies pain    Home Living Family/patient expects to be discharged to:: Private residence Living Arrangements: Other relatives Available Help at Discharge: Family;Available 24 hours/day Type of Home: Apartment Home Access: Level entry       Home Layout: One level Home Equipment: Cane - single point      Prior Function Prior Level of Function : Needs assist  Cognitive Assist : Mobility (cognitive);ADLs (cognitive) Mobility (Cognitive): Set up cues ADLs (Cognitive): Set up cues       Mobility Comments: uses a cane at baseline , able to  ambulate with SPC, no needed assistance ADLs Comments: able to get own shower, per sister.     Extremity/Trunk Assessment   Upper Extremity Assessment Upper Extremity Assessment: Defer to OT evaluation     Lower Extremity Assessment Lower Extremity Assessment: RLE deficits/detail;LLE deficits/detail RLE Deficits / Details: MMT grossly 4/5 RLE Coordination: decreased gross motor LLE Deficits / Details: MMT grossly 4/5 LLE Coordination: decreased gross motor    Cervical / Trunk Assessment Cervical / Trunk Assessment: Normal  Communication   Communication Communication: Impaired Factors Affecting Communication: Difficulty expressing self    Cognition Arousal: Alert Behavior During Therapy: WFL for tasks assessed/performed   PT - Cognitive impairments: Memory, History of cognitive impairments                         Following commands: Intact       Cueing Cueing Techniques: Verbal cues, Tactile cues     General Comments      Exercises     Assessment/Plan    PT Assessment All further PT needs can be met in the next venue of care  PT Problem List Decreased balance;Decreased cognition;Decreased range of motion;Decreased mobility;Decreased knowledge of precautions;Decreased activity tolerance       PT Treatment Interventions      PT Goals (Current goals can be found in the Care Plan section)  Acute Rehab PT Goals Patient Stated Goal: go home    Frequency       Co-evaluation               AM-PAC PT "6 Clicks" Mobility  Outcome Measure Help needed turning from your back to your side while in a flat bed without using bedrails?: None Help needed moving from lying on your back to sitting on the side of a flat bed without using bedrails?: None Help needed moving to and from a bed to a chair (including a wheelchair)?: A Little Help needed standing up from a chair using your arms (e.g., wheelchair or bedside chair)?: A Little Help needed to walk in hospital room?: A Little Help needed climbing 3-5 steps with a railing? : A Little 6 Click Score: 20    End of Session Equipment Utilized During Treatment: Gait belt Activity Tolerance: Patient tolerated  treatment well;No increased pain Patient left: in chair;with call bell/phone within reach;with chair alarm set Nurse Communication: Mobility status (No O2 requirement) PT Visit Diagnosis: Unsteadiness on feet (R26.81);Difficulty in walking, not elsewhere classified (R26.2);Other symptoms and signs involving the nervous system (R29.898)    Time: 4098-1191 PT Time Calculation (min) (ACUTE ONLY): 23 min   Charges:   PT Evaluation $PT Eval Low Complexity: 1 Low PT Treatments $Gait Training: 8-22 mins PT General Charges $$ ACUTE PT VISIT: 1 Visit         Jamesetta Geralds, PT, DPT [facility] Office: [phone]  Jamesetta Geralds 01/11/2024, 3:11 PM

## 2024-01-11 NOTE — Progress Notes (Signed)
 SATURATION QUALIFICATIONS: (This note is used to comply with regulatory documentation for home oxygen)  Patient Saturations on Room Air at Rest = 96%  Patient Saturations on Room Air while Ambulating = 93%   Please briefly explain why patient needs home oxygen: pt does not require supplemental O2  Jamesetta Geralds, PT, DPT [facility] Office: [phone]

## 2024-01-11 NOTE — Progress Notes (Signed)
 PROGRESS NOTE    [name]  [name] DOB: Oct 01, 1951 DOA: 01/09/2024 PCP: Marcine Matar, MD    Brief Narrative: 73 y.o. male with history of seizures, CVA with left-sided hemiparesis, GERD, recent admission to the hospital for encephalopathy postictal in the setting of dehydration and influenza A discharged on 12/30/2023 was brought to the ER after patient had another seizure.  As per the nephew who provided the history patient was not doing well yesterday he had multiple episodes of vomiting but was able to eat breakfast later.  Did not have lunch because he had poor appetite.  Did not complain of any abdominal pain or diarrhea.  Later in the evening patient's nephew witnessed that patient had a generalized tonic-clonic seizure lasting for around 5 minutes but did not lose consciousness.  He checked his temperature it was around 100.4 F and EMS was called and brought to the ER.   ED Course: In the ER patient had a temperature of 100 F tachycardic and labs show WBC count of 18.6 creatinine 1.2 which increased from 0.92 weeks ago.  Chest x-ray shows bilateral infiltrates CT head unremarkable.  COVID and flu test were negative.  Patient was admitted for aspiration pneumonia and acute kidney injury.   Assessment & Plan:   Principal Problem:   Community acquired pneumonia Active Problems:   break through seizure   Memory disorder   Hemiparesis and alteration of sensations as late effects of stroke (HCC)   Liver mass   Dementia (HCC)   ARF (acute renal failure) (HCC)   GERD (gastroesophageal reflux disease)   Pneumonia   1 acute hypoxic respiratory failure secondary to aspiration in the setting of generalized tonic-clonic seizure.  Patient reports he is feeling improved today but still dependent on oxygen.  He does not have oxygen prior to admission. Leukocytosis improving still high though still high. Blood pressure soft continue slow IV fluids Continue Unasyn Encourage  incentive spirometer Out of bed Ambulate Ambulatory oxygen saturation   2 breakthrough seizures admitting physician discussed with on-call neurology and increase the dose of lacosamide 150 mg twice a day along with continuing Keppra and Dilantin at the prior doses.   3 AKI creatinine improving on IV fluids continue blood pressure still soft Labs pending   4 nausea vomiting seems to have resolved  5 history of stroke continue aspirin and statin  6 GERD on PPI  7 dementia on Namenda  8 lesion in the liver noted in the last admission will need outpatient follow-up   Estimated body mass index is 24.57 kg/m as calculated from the following:   Height as of 12/29/23: 5\' 8"  (1.727 m).   Weight as of 01/08/24: 73.3 kg.  DVT prophylaxis:lovenox Code Status: full Family Communication:none Disposition Plan:  Status is: Inpatient   Consultants: none  Procedures: none Antimicrobials:unasyn  Subjective: Slept ok breathing better  Objective: Vitals:   01/10/24 1542 01/10/24 2030 01/10/24 2308 01/11/24 0455  BP: 104/64 (!) 98/57 102/62 (!) 107/59  Pulse: 82 80 83 74  Resp:      Temp:  99.1 F (37.3 C) 98.6 F (37 C) 98.4 F (36.9 C)  TempSrc:  Oral Oral Oral  SpO2: 96% 99% 98% 98%    Intake/Output Summary (Last 24 hours) at 01/11/2024 1106 Last data filed at 01/11/2024 0400 Gross per 24 hour  Intake 1270 ml  Output 600 ml  Net 670 ml   There were no vitals filed for this visit.  Examination:  General  exam: Appears in nad  Respiratory system: few rhonchi to auscultation. Respiratory effort normal. Cardiovascular system: reg Gastrointestinal system: Abdomen is nondistended, soft and nontender. No organomegaly or masses felt. Normal bowel sounds heard. Central nervous system: Alert and oriented. No focal neurological deficits. Extremities:no edema   Data Reviewed: I have personally reviewed following labs and imaging studies  CBC: Recent Labs  Lab 01/09/24 2314  01/10/24 0505  WBC 18.6* 15.3*  NEUTROABS 17.2* 13.7*  HGB 14.0 13.2  HCT 40.2 39.0  MCV 93.7 96.5  PLT 264 268   Basic Metabolic Panel: Recent Labs  Lab 01/09/24 2314 01/10/24 0505  NA 138 136  K 4.5 4.2  CL 105 104  CO2 23 26  GLUCOSE 141* 119*  BUN 22 21  CREATININE 1.26* 1.11  CALCIUM 9.1 8.6*  MG  --  1.9   GFR: Estimated Creatinine Clearance: 59.1 mL/min (by C-G formula based on SCr of 1.11 mg/dL). Liver Function Tests: Recent Labs  Lab 01/10/24 0505  AST 33  ALT 47*  ALKPHOS 109  BILITOT 0.6  PROT 7.2  ALBUMIN 3.5   No results for input(s): "LIPASE", "AMYLASE" in the last 168 hours. No results for input(s): "AMMONIA" in the last 168 hours. Coagulation Profile: No results for input(s): "INR", "PROTIME" in the last 168 hours. Cardiac Enzymes: No results for input(s): "CKTOTAL", "CKMB", "CKMBINDEX", "TROPONINI" in the last 168 hours. BNP (last 3 results) No results for input(s): "PROBNP" in the last 8760 hours. HbA1C: No results for input(s): "HGBA1C" in the last 72 hours. CBG: Recent Labs  Lab 01/09/24 2204  GLUCAP 148*   Lipid Profile: No results for input(s): "CHOL", "HDL", "LDLCALC", "TRIG", "CHOLHDL", "LDLDIRECT" in the last 72 hours. Thyroid Function Tests: No results for input(s): "TSH", "T4TOTAL", "FREET4", "T3FREE", "THYROIDAB" in the last 72 hours. Anemia Panel: No results for input(s): "VITAMINB12", "FOLATE", "FERRITIN", "TIBC", "IRON", "RETICCTPCT" in the last 72 hours. Sepsis Labs: Recent Labs  Lab 01/09/24 2341  LATICACIDVEN 1.9    Recent Results (from the past 240 hours)  Resp panel by RT-PCR (RSV, Flu A&B, Covid) Anterior Nasal Swab     Status: None   Collection Time: 01/09/24 11:14 PM   Specimen: Anterior Nasal Swab  Result Value Ref Range Status   SARS Coronavirus 2 by RT PCR NEGATIVE NEGATIVE Final    Comment: (NOTE) SARS-CoV-2 target nucleic acids are NOT DETECTED.  The SARS-CoV-2 RNA is generally detectable in upper  respiratory specimens during the acute phase of infection. The lowest concentration of SARS-CoV-2 viral copies this assay can detect is 138 copies/mL. A negative result does not preclude SARS-Cov-2 infection and should not be used as the sole basis for treatment or other patient management decisions. A negative result may occur with  improper specimen collection/handling, submission of specimen other than nasopharyngeal swab, presence of viral mutation(s) within the areas targeted by this assay, and inadequate number of viral copies(<138 copies/mL). A negative result must be combined with clinical observations, patient history, and epidemiological information. The expected result is Negative.  Fact Sheet for Patients:  BloggerCourse.com  Fact Sheet for Healthcare Providers:  SeriousBroker.it  This test is no t yet approved or cleared by the [place] FDA and  has been authorized for detection and/or diagnosis of SARS-CoV-2 by FDA under an Emergency Use Authorization (EUA). This EUA will remain  in effect (meaning this test can be used) for the duration of the COVID-19 declaration under Section 564(b)(1) of the Act, 21 U.S.C.section 360bbb-3(b)(1), unless the authorization  is terminated  or revoked sooner.       Influenza A by PCR NEGATIVE NEGATIVE Final   Influenza B by PCR NEGATIVE NEGATIVE Final    Comment: (NOTE) The Xpert Xpress SARS-CoV-2/FLU/RSV plus assay is intended as an aid in the diagnosis of influenza from Nasopharyngeal swab specimens and should not be used as a sole basis for treatment. Nasal washings and aspirates are unacceptable for Xpert Xpress SARS-CoV-2/FLU/RSV testing.  Fact Sheet for Patients: BloggerCourse.com  Fact Sheet for Healthcare Providers: SeriousBroker.it  This test is not yet approved or cleared by the [place] FDA and has been  authorized for detection and/or diagnosis of SARS-CoV-2 by FDA under an Emergency Use Authorization (EUA). This EUA will remain in effect (meaning this test can be used) for the duration of the COVID-19 declaration under Section 564(b)(1) of the Act, 21 U.S.C. section 360bbb-3(b)(1), unless the authorization is terminated or revoked.     Resp Syncytial Virus by PCR NEGATIVE NEGATIVE Final    Comment: (NOTE) Fact Sheet for Patients: BloggerCourse.com  Fact Sheet for Healthcare Providers: SeriousBroker.it  This test is not yet approved or cleared by the [place] FDA and has been authorized for detection and/or diagnosis of SARS-CoV-2 by FDA under an Emergency Use Authorization (EUA). This EUA will remain in effect (meaning this test can be used) for the duration of the COVID-19 declaration under Section 564(b)(1) of the Act, 21 U.S.C. section 360bbb-3(b)(1), unless the authorization is terminated or revoked.  Performed at [facility], [address], [place], [place] [address]   Culture, blood (routine x 2)     Status: None (Preliminary result)   Collection Time: 01/09/24 11:35 PM   Specimen: BLOOD  Result Value Ref Range Status   Specimen Description   Final    BLOOD Performed at [facility], [address] [place], [place] [address]    Special Requests   Final    BOTTLES DRAWN AEROBIC AND ANAEROBIC Blood Culture results may not be optimal due to an inadequate volume of blood received in culture bottles Performed at [facility], [address], [place], [place] [address]    Culture   Final    NO GROWTH 1 DAY Performed at [facility], [address], [place], [place] [address]    Report Status PENDING  Incomplete  Culture, blood (routine x 2)     Status: None (Preliminary result)   Collection Time: 01/10/24 12:29 AM   Specimen: BLOOD  Result Value Ref  Range Status   Specimen Description   Final    BLOOD LEFT ANTECUBITAL Performed at [facility], [address] [place], [place] [address]    Special Requests   Final    BOTTLES DRAWN AEROBIC AND ANAEROBIC Blood Culture adequate volume Performed at [facility], [address], [place], [place] [address]    Culture   Final    NO GROWTH 1 DAY Performed at [facility], [address] [place], [place] [address]    Report Status PENDING  Incomplete         Radiology Studies: DG Abd 1 View Result Date: 01/10/2024 CLINICAL DATA:  73 year old male with nausea and vomiting. EXAM: ABDOMEN - 1 VIEW COMPARISON:  CT Abdomen and Pelvis 12/29/2023. FINDINGS: AP view at 0424 hours. Left hip arthroplasty redemonstrated. Non obstructed bowel gas pattern. Lung bases not included. Aortoiliac calcified atherosclerosis. Stable visualized osseous structures. IMPRESSION: Non obstructed bowel  gas pattern. Electronically Signed   By: Odessa Fleming M.D.   On: 01/10/2024 04:57   CT Head Wo Contrast Result Date: 01/09/2024 CLINICAL DATA:  Seizure disorder EXAM: CT HEAD WITHOUT CONTRAST TECHNIQUE: Contiguous axial images were obtained from the base of the skull through the vertex without intravenous contrast. RADIATION DOSE REDUCTION: This exam was performed according to the departmental dose-optimization program which includes automated exposure control, adjustment of the mA and/or kV according to patient size and/or use of iterative reconstruction technique. COMPARISON:  12/29/2023 FINDINGS: Brain: Chronic encephalomalacia within the right temporal and parietal lobes, with ex vacuo dilatation of the lateral ventricles. No evidence of acute infarct or hemorrhage. Lateral ventricles and midline structures are stable. No acute extra-axial fluid collections. No mass effect. Vascular: Stable atherosclerosis. Skull: Normal. Negative for fracture or focal lesion. Sinuses/Orbits: No  acute finding. Stable bilateral mastoid effusions. Other: None. IMPRESSION: 1. Stable head CT, no acute intracranial process. Electronically Signed   By: Sharlet Salina M.D.   On: 01/09/2024 23:12   DG Chest 2 View Result Date: 01/09/2024 CLINICAL DATA:  Fever, seizure like activity EXAM: CHEST - 2 VIEW COMPARISON:  12/29/2023 FINDINGS: Frontal and lateral views of the chest demonstrate an unremarkable cardiac silhouette. There is patchy bibasilar airspace disease, left greater than right. No effusion or pneumothorax. No acute bony abnormalities. IMPRESSION: 1. Patchy bibasilar airspace disease, consistent with infection or aspiration. Electronically Signed   By: Sharlet Salina M.D.   On: 01/09/2024 22:45    Scheduled Meds:  aspirin EC  81 mg Oral Daily   enoxaparin (LOVENOX) injection  40 mg Subcutaneous Q24H   lacosamide  150 mg Oral BID   levETIRAcetam  1,500 mg Oral BID   memantine  10 mg Oral BID   pantoprazole  40 mg Oral Daily   phenytoin  25 mg Oral QHS   phenytoin  200 mg Oral QHS   rosuvastatin  10 mg Oral Daily   Continuous Infusions:  ampicillin-sulbactam (UNASYN) IV 3 g (01/11/24 0505)     LOS: 1 day    Time spent: 38 min  Alwyn Ren, MD 01/11/2024, 11:06 AM

## 2024-01-11 NOTE — Plan of Care (Signed)

## 2024-01-12 DIAGNOSIS — J189 Pneumonia, unspecified organism: Secondary | ICD-10-CM | POA: Diagnosis not present

## 2024-01-12 MED ORDER — LACOSAMIDE 150 MG PO TABS: 150.0000 mg | ORAL_TABLET | Freq: Two times a day (BID) | ORAL | 4 refills | Status: DC

## 2024-01-12 MED ORDER — AMOXICILLIN-POT CLAVULANATE 875-125 MG PO TABS: 1.0000 | ORAL_TABLET | Freq: Two times a day (BID) | ORAL | 0 refills | Status: DC

## 2024-01-12 NOTE — Discharge Summary (Signed)
 Physician Discharge Summary  [name] OZD:[id number] DOB: 04-10-1951 DOA: 01/09/2024  PCP: Marcine Matar, MD  Admit date: 01/09/2024 Discharge date: 01/12/2024  Admitted From: home Disposition: home Recommendations for Outpatient Follow-up:  Follow up with PCP in 1-2 weeks Please obtain BMP/CBC in one week Dr. Laural Benes please obtain abdominal MRI with and without IV contrast to follow-up on the 2.2 cm mass in the right liver  Home Health:none Equipment/Devices:none Discharge Condition:stable CODE STATUS:full Diet recommendation: cardiac Brief/Interim Summary: 73 y.o. male with history of seizures, CVA with left-sided hemiparesis, GERD, recent admission to the hospital for encephalopathy postictal in the setting of dehydration and influenza A discharged on 12/30/2023 was brought to the ER after patient had another seizure.  As per the nephew who provided the history patient was not doing well yesterday he had multiple episodes of vomiting but was able to eat breakfast later.  Did not have lunch because he had poor appetite.  Did not complain of any abdominal pain or diarrhea.  Later in the evening patient's nephew witnessed that patient had a generalized tonic-clonic seizure lasting for around 5 minutes but did not lose consciousness.  He checked his temperature it was around 100.4 F and EMS was called and brought to the ER.   ED Course: In the ER patient had a temperature of 100 F tachycardic and labs show WBC count of 18.6 creatinine 1.2 which increased from 0.92 weeks ago.  Chest x-ray shows bilateral infiltrates CT head unremarkable.  COVID and flu test were negative.  Patient was admitted for aspiration pneumonia and acute kidney injury.  Discharge Diagnoses:  Principal Problem:   Community acquired pneumonia Active Problems:   break through seizure   Memory disorder   Hemiparesis and alteration of sensations as late effects of stroke (HCC)   Liver mass   Dementia (HCC)    ARF (acute renal failure) (HCC)   GERD (gastroesophageal reflux disease)   Pneumonia     1 acute hypoxic respiratory failure secondary to aspiration in the setting of generalized tonic-clonic seizure.  He was treated with Unasyn and oxygen.  He walked prior to discharge without dropping his saturation and did not require oxygen on discharge.      2 breakthrough seizures admitting physician discussed with on-call neurology and increase the dose of lacosamide 150 mg twice a day along with continuing Keppra and Dilantin at the prior doses.    3 AKI creatinine 0.79 on  discharge.resolved with IV fluids    4 nausea vomiting  resolved   5 history of stroke continue aspirin and statin   6 GERD on PPI   7 dementia on Namenda   8 lesion in the liver noted in the last admission will need outpatient follow-up  Estimated body mass index is 24.57 kg/m as calculated from the following:   Height as of this encounter: 5\' 8"  (1.727 m).   Weight as of 01/08/24: 73.3 kg.  Discharge Instructions  Discharge Instructions     Diet - low sodium heart healthy   Complete by: As directed    Face-to-face encounter (required for Medicare/Medicaid patients)   Complete by: As directed    I Alwyn Ren certify that this patient is under my care and that I, or a nurse practitioner or physician's assistant working with me, had a face-to-face encounter that meets the physician face-to-face encounter requirements with this patient on 01/12/2024. The encounter with the patient was in whole, or in part for the following medical  condition(s) which is the primary reason for home health care (List medical condition): seizures   The encounter with the patient was in whole, or in part, for the following medical condition, which is the primary reason for home health care: pneumonia   I certify that, based on my findings, the following services are medically necessary home health services: Physical therapy   Reason  for Medically Necessary Home Health Services: Therapy- Investment banker, operational, Patent examiner   My clinical findings support the need for the above services: Unable to leave home safely without assistance and/or assistive device   Further, I certify that my clinical findings support that this patient is homebound due to: Unable to leave home safely without assistance   Home Health   Complete by: As directed    To provide the following care/treatments:  PT OT     Increase activity slowly   Complete by: As directed       Allergies as of 01/12/2024   No Known Allergies      Medication List     STOP taking these medications    oseltamivir 75 MG capsule Commonly known as: TAMIFLU       TAKE these medications    amoxicillin-clavulanate 875-125 MG tablet Commonly known as: AUGMENTIN Take 1 tablet by mouth 2 (two) times daily.   aspirin EC 81 MG tablet Take 1 tablet (81 mg total) by mouth daily.   guaiFENesin 600 MG 12 hr tablet Commonly known as: Mucinex Take 1 tablet (600 mg total) by mouth 2 (two) times daily.   Lacosamide 150 MG Tabs Take 1 tablet (150 mg total) by mouth 2 (two) times daily. What changed:  medication strength how much to take when to take this   levETIRAcetam 750 MG tablet Commonly known as: KEPPRA Take 2 tablets (1,500 mg total) by mouth 2 (two) times daily. What changed:  when to take this additional instructions   memantine 10 MG tablet Commonly known as: NAMENDA Take 1 tablet (10 mg total) by mouth 2 (two) times daily. What changed: when to take this   ondansetron 4 MG tablet Commonly known as: ZOFRAN Take 1 tablet (4 mg total) by mouth every 6 (six) hours as needed for nausea.   pantoprazole 40 MG tablet Commonly known as: PROTONIX Take 1 tablet (40 mg total) by mouth daily. What changed: when to take this   phenytoin 100 MG ER capsule Commonly known as: DILANTIN Take 2 capsules (200 mg total) by mouth at bedtime.  FOR SEIZURES   Phenytoin Infatabs 50 MG tablet Generic drug: phenytoin Chew 25 mg by mouth at bedtime.   rosuvastatin 10 MG tablet Commonly known as: Crestor Take 1 tablet (10 mg total) by mouth daily. STOP Simvastatin        Follow-up Information     Marcine Matar, MD Follow up.   Specialty: Internal Medicine Contact information: [address] [place] [address] [place] [address] [phone]                No Known Allergies  Consultations:none  Procedures/Studies: DG Abd 1 View Result Date: 01/10/2024 CLINICAL DATA:  73 year old male with nausea and vomiting. EXAM: ABDOMEN - 1 VIEW COMPARISON:  CT Abdomen and Pelvis 12/29/2023. FINDINGS: AP view at 0424 hours. Left hip arthroplasty redemonstrated. Non obstructed bowel gas pattern. Lung bases not included. Aortoiliac calcified atherosclerosis. Stable visualized osseous structures. IMPRESSION: Non obstructed bowel gas pattern. Electronically Signed   By: Althea Grimmer.D.  On: 01/10/2024 04:57   CT Head Wo Contrast Result Date: 01/09/2024 CLINICAL DATA:  Seizure disorder EXAM: CT HEAD WITHOUT CONTRAST TECHNIQUE: Contiguous axial images were obtained from the base of the skull through the vertex without intravenous contrast. RADIATION DOSE REDUCTION: This exam was performed according to the departmental dose-optimization program which includes automated exposure control, adjustment of the mA and/or kV according to patient size and/or use of iterative reconstruction technique. COMPARISON:  12/29/2023 FINDINGS: Brain: Chronic encephalomalacia within the right temporal and parietal lobes, with ex vacuo dilatation of the lateral ventricles. No evidence of acute infarct or hemorrhage. Lateral ventricles and midline structures are stable. No acute extra-axial fluid collections. No mass effect. Vascular: Stable atherosclerosis. Skull: Normal. Negative for fracture or focal lesion. Sinuses/Orbits: No acute finding. Stable bilateral  mastoid effusions. Other: None. IMPRESSION: 1. Stable head CT, no acute intracranial process. Electronically Signed   By: Sharlet Salina M.D.   On: 01/09/2024 23:12   DG Chest 2 View Result Date: 01/09/2024 CLINICAL DATA:  Fever, seizure like activity EXAM: CHEST - 2 VIEW COMPARISON:  12/29/2023 FINDINGS: Frontal and lateral views of the chest demonstrate an unremarkable cardiac silhouette. There is patchy bibasilar airspace disease, left greater than right. No effusion or pneumothorax. No acute bony abnormalities. IMPRESSION: 1. Patchy bibasilar airspace disease, consistent with infection or aspiration. Electronically Signed   By: Sharlet Salina M.D.   On: 01/09/2024 22:45   CT Angio Chest PE W and/or Wo Contrast Result Date: 12/29/2023 CLINICAL DATA:  Pulmonary embolism (PE) suspected, low to intermediate prob, neg D-dimer; Abdominal pain, acute, nonlocalized. Postictal, history of seizures. Cough. Abdominal pain. EXAM: CT ANGIOGRAPHY CHEST CT ABDOMEN AND PELVIS WITH CONTRAST TECHNIQUE: Multidetector CT imaging of the chest was performed using the standard protocol during bolus administration of intravenous contrast. Multiplanar CT image reconstructions and MIPs were obtained to evaluate the vascular anatomy. Multidetector CT imaging of the abdomen and pelvis was performed using the standard protocol during bolus administration of intravenous contrast. RADIATION DOSE REDUCTION: This exam was performed according to the departmental dose-optimization program which includes automated exposure control, adjustment of the mA and/or kV according to patient size and/or use of iterative reconstruction technique. CONTRAST:  OMNIPAQUE IOHEXOL 350 MG/ML SOLN COMPARISON:  Chest radiograph from earlier today. 05/01/2017 CT virtual colonoscopy. FINDINGS: CTA CHEST FINDINGS Cardiovascular: The study is moderate quality for the evaluation of pulmonary embolism, degraded by motion and streak artifact. There are no  convincing filling defects in the central, lobar, segmental or subsegmental pulmonary artery branches to suggest acute pulmonary embolism. Atherosclerotic nonaneurysmal thoracic aorta. Normal caliber pulmonary arteries. Normal heart size. No significant pericardial fluid/thickening. Left anterior descending coronary atherosclerosis. Mediastinum/Nodes: No significant thyroid nodules. Unremarkable esophagus. No pathologically enlarged axillary, mediastinal or hilar lymph nodes. Lungs/Pleura: No pneumothorax. No pleural effusion. Mild paraseptal and centrilobular emphysema. No acute consolidative airspace disease, lung masses or significant pulmonary nodules. Mildly thickened curvilinear parenchymal band in the peripheral left lower lobe compatible with nonspecific postinfectious/postinflammatory scarring. Musculoskeletal: No aggressive appearing focal osseous lesions. Mild thoracic spondylosis. Review of the MIP images confirms the above findings. CT ABDOMEN and PELVIS FINDINGS Substantially motion degraded scan, limiting assessment. Hepatobiliary: Segment 8 right liver 2.2 x 1.7 cm mass with apparent targetoid enhancement (series 3/image 14), not definitely seen on prior noncontrast CT images from 05/01/2017. Hypodense 1.4 cm anterior segment 2 left liver lesion (series 3/image 18), not definitely seen on prior noncontrast CT. Subcentimeter hypodense peripheral right liver lesion is too small to characterize  and unchanged from 2018, considered benign. Normal gallbladder with no radiopaque cholelithiasis. No biliary ductal dilatation. Pancreas: Normal, with no mass or duct dilation. Spleen: Normal size. No mass. Adrenals/Urinary Tract: Normal adrenals. Subcentimeter hypodense upper right renal cortical lesion, too small to characterize, for which no follow-up imaging is recommended. No hydronephrosis. Normal bladder. Stomach/Bowel: Small hiatal hernia. Otherwise normal nondistended stomach. Normal caliber small bowel  with no small bowel wall thickening. Normal appendix. Normal large bowel with no diverticulosis, large bowel wall thickening or pericolonic fat stranding. Vascular/Lymphatic: Atherosclerotic nonaneurysmal abdominal aorta. Patent portal, splenic, hepatic and renal veins. No pathologically enlarged lymph nodes in the abdomen or pelvis. Reproductive: Top-normal size prostate. Other: No pneumoperitoneum, ascites or focal fluid collection. Musculoskeletal: No aggressive appearing focal osseous lesions. Left total hip arthroplasty. Mild lumbar spondylosis. Review of the MIP images confirms the above findings. IMPRESSION: 1. Substantially motion degraded scan, limiting assessment. 2. No evidence of acute pulmonary embolism. No acute abnormality in the chest. 3. Indeterminate segment 8 right liver 2.2 cm mass with apparent targetoid enhancement and hypodense 1.4 cm anterior segment 2 left liver lesion, not definitely seen on prior noncontrast CT from 2018. Malignancy not excluded. Recommend further evaluation with nonemergent abdominal MRI without and with IV contrast. 4. One vessel coronary atherosclerosis. 5. Small hiatal hernia. 6. Aortic Atherosclerosis (ICD10-I70.0) and Emphysema (ICD10-J43.9). Electronically Signed   By: Delbert Phenix M.D.   On: 12/29/2023 10:03   CT ABDOMEN PELVIS W CONTRAST Result Date: 12/29/2023 CLINICAL DATA:  Pulmonary embolism (PE) suspected, low to intermediate prob, neg D-dimer; Abdominal pain, acute, nonlocalized. Postictal, history of seizures. Cough. Abdominal pain. EXAM: CT ANGIOGRAPHY CHEST CT ABDOMEN AND PELVIS WITH CONTRAST TECHNIQUE: Multidetector CT imaging of the chest was performed using the standard protocol during bolus administration of intravenous contrast. Multiplanar CT image reconstructions and MIPs were obtained to evaluate the vascular anatomy. Multidetector CT imaging of the abdomen and pelvis was performed using the standard protocol during bolus administration of  intravenous contrast. RADIATION DOSE REDUCTION: This exam was performed according to the departmental dose-optimization program which includes automated exposure control, adjustment of the mA and/or kV according to patient size and/or use of iterative reconstruction technique. CONTRAST:  OMNIPAQUE IOHEXOL 350 MG/ML SOLN COMPARISON:  Chest radiograph from earlier today. 05/01/2017 CT virtual colonoscopy. FINDINGS: CTA CHEST FINDINGS Cardiovascular: The study is moderate quality for the evaluation of pulmonary embolism, degraded by motion and streak artifact. There are no convincing filling defects in the central, lobar, segmental or subsegmental pulmonary artery branches to suggest acute pulmonary embolism. Atherosclerotic nonaneurysmal thoracic aorta. Normal caliber pulmonary arteries. Normal heart size. No significant pericardial fluid/thickening. Left anterior descending coronary atherosclerosis. Mediastinum/Nodes: No significant thyroid nodules. Unremarkable esophagus. No pathologically enlarged axillary, mediastinal or hilar lymph nodes. Lungs/Pleura: No pneumothorax. No pleural effusion. Mild paraseptal and centrilobular emphysema. No acute consolidative airspace disease, lung masses or significant pulmonary nodules. Mildly thickened curvilinear parenchymal band in the peripheral left lower lobe compatible with nonspecific postinfectious/postinflammatory scarring. Musculoskeletal: No aggressive appearing focal osseous lesions. Mild thoracic spondylosis. Review of the MIP images confirms the above findings. CT ABDOMEN and PELVIS FINDINGS Substantially motion degraded scan, limiting assessment. Hepatobiliary: Segment 8 right liver 2.2 x 1.7 cm mass with apparent targetoid enhancement (series 3/image 14), not definitely seen on prior noncontrast CT images from 05/01/2017. Hypodense 1.4 cm anterior segment 2 left liver lesion (series 3/image 18), not definitely seen on prior noncontrast CT. Subcentimeter  hypodense peripheral right liver lesion is too small to characterize  and unchanged from 2018, considered benign. Normal gallbladder with no radiopaque cholelithiasis. No biliary ductal dilatation. Pancreas: Normal, with no mass or duct dilation. Spleen: Normal size. No mass. Adrenals/Urinary Tract: Normal adrenals. Subcentimeter hypodense upper right renal cortical lesion, too small to characterize, for which no follow-up imaging is recommended. No hydronephrosis. Normal bladder. Stomach/Bowel: Small hiatal hernia. Otherwise normal nondistended stomach. Normal caliber small bowel with no small bowel wall thickening. Normal appendix. Normal large bowel with no diverticulosis, large bowel wall thickening or pericolonic fat stranding. Vascular/Lymphatic: Atherosclerotic nonaneurysmal abdominal aorta. Patent portal, splenic, hepatic and renal veins. No pathologically enlarged lymph nodes in the abdomen or pelvis. Reproductive: Top-normal size prostate. Other: No pneumoperitoneum, ascites or focal fluid collection. Musculoskeletal: No aggressive appearing focal osseous lesions. Left total hip arthroplasty. Mild lumbar spondylosis. Review of the MIP images confirms the above findings. IMPRESSION: 1. Substantially motion degraded scan, limiting assessment. 2. No evidence of acute pulmonary embolism. No acute abnormality in the chest. 3. Indeterminate segment 8 right liver 2.2 cm mass with apparent targetoid enhancement and hypodense 1.4 cm anterior segment 2 left liver lesion, not definitely seen on prior noncontrast CT from 2018. Malignancy not excluded. Recommend further evaluation with nonemergent abdominal MRI without and with IV contrast. 4. One vessel coronary atherosclerosis. 5. Small hiatal hernia. 6. Aortic Atherosclerosis (ICD10-I70.0) and Emphysema (ICD10-J43.9). Electronically Signed   By: Delbert Phenix M.D.   On: 12/29/2023 10:03   DG Chest Portable 1 View Result Date: 12/29/2023 CLINICAL DATA:  cough EXAM:  PORTABLE CHEST 1 VIEW COMPARISON:  Feb 24, 2023, November 29, 2013 FINDINGS: The cardiomediastinal silhouette is unchanged in contour.Similar elevation of the LEFT hemidiaphragm with signs of underlying LEFT-sided volume loss. No pleural effusion. No pneumothorax. Asymmetric reticulonodular opacities throughout the LEFT lung, mildly increased since prior. More focal heterogeneous opacity along the LEFT lung base, similar comparison to priors. IMPRESSION: 1. Asymmetric reticulonodular opacities throughout the LEFT lung, mildly increased since prior. This could reflect aspiration, atelectasis or atypical infection superimposed on a background of chronic interstitial lung disease or emphysema. 2. More focal heterogeneous opacity along the LEFT lung base, similar in comparison to priors. This could reflect atelectasis versus infection. 3. Similar appearance of asymmetric LEFT-sided volume loss. Given underlying emphysema, recommend evaluation for candidacy for annual lung cancer screening CT. Electronically Signed   By: Meda Klinefelter M.D.   On: 12/29/2023 08:17   CT Head Wo Contrast Result Date: 12/29/2023 CLINICAL DATA:  73 year old male found with seizure activity on toilet. EXAM: CT HEAD WITHOUT CONTRAST TECHNIQUE: Contiguous axial images were obtained from the base of the skull through the vertex without intravenous contrast. RADIATION DOSE REDUCTION: This exam was performed according to the departmental dose-optimization program which includes automated exposure control, adjustment of the mA and/or kV according to patient size and/or use of iterative reconstruction technique. COMPARISON:  Brain MRI 07/26/2020, head CT 09/12/2023. FINDINGS: Brain: Right hemisphere encephalomalacia with ex vacuo ventricular enlargement, stable. Chronic disproportionate bilateral cerebellar volume loss also appears stable. No midline shift, acute ventriculomegaly, mass effect, evidence of mass lesion, intracranial hemorrhage or  evidence of cortically based acute infarction. Stable gray-white matter differentiation throughout the brain. Vascular: Calcified atherosclerosis at the skull base. No suspicious intracranial vascular hyperdensity. Skull: Stable, intact.  Right TMJ degeneration. Sinuses/Orbits: New bilateral tympanic cavity and mastoid opacification since November. Paranasal sinuses remain well aerated. Other: No acute orbit or scalp soft tissue finding. IMPRESSION: 1. No acute intracranial abnormality. Stable non contrast CT appearance of the brain with  encephalomalacia most pronounced in the right hemisphere. 2. New bilateral tympanic cavity and mastoid opacification since November, consider Acute Otitis Media with mastoid effusions. Electronically Signed   By: Odessa Fleming M.D.   On: 12/29/2023 07:50   (Echo, Carotid, EGD, Colonoscopy, ERCP)    Subjective: No new events  Discharge Exam: Vitals:   01/12/24 0425 01/12/24 1117  BP: 117/86 106/68  Pulse: 69 65  Resp: 20 16  Temp: 98.3 F (36.8 C) 98.1 F (36.7 C)  SpO2: 96%    Vitals:   01/11/24 2032 01/12/24 0425 01/12/24 0817 01/12/24 1117  BP: 127/76 117/86  106/68  Pulse: 66 69  65  Resp: 18 20  16   Temp: 98.1 F (36.7 C) 98.3 F (36.8 C)  98.1 F (36.7 C)  TempSrc: Oral Oral  Oral  SpO2: 98% 96%    Height:   5\' 8"  (1.727 m)     General: Pt is alert, awake, not in acute distress Cardiovascular: RRR, S1/S2 +, no rubs, no gallops Respiratory: CTA bilaterally, no wheezing, no rhonchi Abdominal: Soft, NT, ND, bowel sounds + Extremities: no edema, no cyanosis    The results of significant diagnostics from this hospitalization (including imaging, microbiology, ancillary and laboratory) are listed below for reference.     Microbiology: Recent Results (from the past 240 hours)  Resp panel by RT-PCR (RSV, Flu A&B, Covid) Anterior Nasal Swab     Status: None   Collection Time: 01/09/24 11:14 PM   Specimen: Anterior Nasal Swab  Result Value Ref  Range Status   SARS Coronavirus 2 by RT PCR NEGATIVE NEGATIVE Final    Comment: (NOTE) SARS-CoV-2 target nucleic acids are NOT DETECTED.  The SARS-CoV-2 RNA is generally detectable in upper respiratory specimens during the acute phase of infection. The lowest concentration of SARS-CoV-2 viral copies this assay can detect is 138 copies/mL. A negative result does not preclude SARS-Cov-2 infection and should not be used as the sole basis for treatment or other patient management decisions. A negative result may occur with  improper specimen collection/handling, submission of specimen other than nasopharyngeal swab, presence of viral mutation(s) within the areas targeted by this assay, and inadequate number of viral copies(<138 copies/mL). A negative result must be combined with clinical observations, patient history, and epidemiological information. The expected result is Negative.  Fact Sheet for Patients:  BloggerCourse.com  Fact Sheet for Healthcare Providers:  SeriousBroker.it  This test is no t yet approved or cleared by the [place] FDA and  has been authorized for detection and/or diagnosis of SARS-CoV-2 by FDA under an Emergency Use Authorization (EUA). This EUA will remain  in effect (meaning this test can be used) for the duration of the COVID-19 declaration under Section 564(b)(1) of the Act, 21 U.S.C.section 360bbb-3(b)(1), unless the authorization is terminated  or revoked sooner.       Influenza A by PCR NEGATIVE NEGATIVE Final   Influenza B by PCR NEGATIVE NEGATIVE Final    Comment: (NOTE) The Xpert Xpress SARS-CoV-2/FLU/RSV plus assay is intended as an aid in the diagnosis of influenza from Nasopharyngeal swab specimens and should not be used as a sole basis for treatment. Nasal washings and aspirates are unacceptable for Xpert Xpress SARS-CoV-2/FLU/RSV testing.  Fact Sheet for  Patients: BloggerCourse.com  Fact Sheet for Healthcare Providers: SeriousBroker.it  This test is not yet approved or cleared by the [place] FDA and has been authorized for detection and/or diagnosis of SARS-CoV-2 by FDA under an Emergency Use Authorization (EUA).  This EUA will remain in effect (meaning this test can be used) for the duration of the COVID-19 declaration under Section 564(b)(1) of the Act, 21 U.S.C. section 360bbb-3(b)(1), unless the authorization is terminated or revoked.     Resp Syncytial Virus by PCR NEGATIVE NEGATIVE Final    Comment: (NOTE) Fact Sheet for Patients: BloggerCourse.com  Fact Sheet for Healthcare Providers: SeriousBroker.it  This test is not yet approved or cleared by the [place] FDA and has been authorized for detection and/or diagnosis of SARS-CoV-2 by FDA under an Emergency Use Authorization (EUA). This EUA will remain in effect (meaning this test can be used) for the duration of the COVID-19 declaration under Section 564(b)(1) of the Act, 21 U.S.C. section 360bbb-3(b)(1), unless the authorization is terminated or revoked.  Performed at [facility], [address] [place], [place] [address]   Culture, blood (routine x 2)     Status: None (Preliminary result)   Collection Time: 01/09/24 11:35 PM   Specimen: BLOOD  Result Value Ref Range Status   Specimen Description   Final    BLOOD Performed at [facility], [address], [place], [place] [address]    Special Requests   Final    BOTTLES DRAWN AEROBIC AND ANAEROBIC Blood Culture results may not be optimal due to an inadequate volume of blood received in culture bottles Performed at [facility], [address] [place], [place] [address]    Culture   Final    NO GROWTH 2 DAYS Performed at [facility],  [address], [place], [place] [address]    Report Status PENDING  Incomplete  Culture, blood (routine x 2)     Status: None (Preliminary result)   Collection Time: 01/10/24 12:29 AM   Specimen: BLOOD  Result Value Ref Range Status   Specimen Description   Final    BLOOD LEFT ANTECUBITAL Performed at [facility], [address], [place], [place] [address]    Special Requests   Final    BOTTLES DRAWN AEROBIC AND ANAEROBIC Blood Culture adequate volume Performed at [facility], [address] [place], [place] [address]    Culture   Final    NO GROWTH 2 DAYS Performed at [facility], [address] [place], [place] [address]    Report Status PENDING  Incomplete     Labs: BNP (last 3 results) No results for input(s): "BNP" in the last 8760 hours. Basic Metabolic Panel: Recent Labs  Lab 01/09/24 2314 01/10/24 0505 01/11/24 1133  NA 138 136 136  K 4.5 4.2 4.5  CL 105 104 105  CO2 23 26 24   GLUCOSE 141* 119* 97  BUN 22 21 10   CREATININE 1.26* 1.11 0.79  CALCIUM 9.1 8.6* 8.5*  MG  --  1.9  --    Liver Function Tests: Recent Labs  Lab 01/10/24 0505  AST 33  ALT 47*  ALKPHOS 109  BILITOT 0.6  PROT 7.2  ALBUMIN 3.5   No results for input(s): "LIPASE", "AMYLASE" in the last 168 hours. No results for input(s): "AMMONIA" in the last 168 hours. CBC: Recent Labs  Lab 01/09/24 2314 01/10/24 0505  WBC 18.6* 15.3*  NEUTROABS 17.2* 13.7*  HGB 14.0 13.2  HCT 40.2 39.0  MCV 93.7 96.5  PLT 264 268   Cardiac Enzymes: No results for input(s): "CKTOTAL", "CKMB", "CKMBINDEX", "TROPONINI" in the last 168 hours. BNP: Invalid input(s): "POCBNP" CBG: Recent  Labs  Lab 01/09/24 2204  GLUCAP 148*   D-Dimer No results for input(s): "DDIMER" in the last 72 hours. Hgb A1c No results for input(s): "HGBA1C" in the last 72 hours. Lipid Profile No results for input(s): "CHOL", "HDL", "LDLCALC", "TRIG", "CHOLHDL", "LDLDIRECT" in the  last 72 hours. Thyroid function studies No results for input(s): "TSH", "T4TOTAL", "T3FREE", "THYROIDAB" in the last 72 hours.  Invalid input(s): "FREET3" Anemia work up No results for input(s): "VITAMINB12", "FOLATE", "FERRITIN", "TIBC", "IRON", "RETICCTPCT" in the last 72 hours. Urinalysis    Component Value Date/Time   COLORURINE AMBER (A) 01/10/2024 0101   APPEARANCEUR HAZY (A) 01/10/2024 0101   LABSPEC 1.031 (H) 01/10/2024 0101   PHURINE 5.0 01/10/2024 0101   GLUCOSEU NEGATIVE 01/10/2024 0101   HGBUR NEGATIVE 01/10/2024 0101   BILIRUBINUR NEGATIVE 01/10/2024 0101   KETONESUR NEGATIVE 01/10/2024 0101   PROTEINUR 30 (A) 01/10/2024 0101   UROBILINOGEN 1.0 01/01/2015 0736   NITRITE NEGATIVE 01/10/2024 0101   LEUKOCYTESUR NEGATIVE 01/10/2024 0101   Sepsis Labs Recent Labs  Lab 01/09/24 2314 01/10/24 0505  WBC 18.6* 15.3*   Microbiology Recent Results (from the past 240 hours)  Resp panel by RT-PCR (RSV, Flu A&B, Covid) Anterior Nasal Swab     Status: None   Collection Time: 01/09/24 11:14 PM   Specimen: Anterior Nasal Swab  Result Value Ref Range Status   SARS Coronavirus 2 by RT PCR NEGATIVE NEGATIVE Final    Comment: (NOTE) SARS-CoV-2 target nucleic acids are NOT DETECTED.  The SARS-CoV-2 RNA is generally detectable in upper respiratory specimens during the acute phase of infection. The lowest concentration of SARS-CoV-2 viral copies this assay can detect is 138 copies/mL. A negative result does not preclude SARS-Cov-2 infection and should not be used as the sole basis for treatment or other patient management decisions. A negative result may occur with  improper specimen collection/handling, submission of specimen other than nasopharyngeal swab, presence of viral mutation(s) within the areas targeted by this assay, and inadequate number of viral copies(<138 copies/mL). A negative result must be combined with clinical observations, patient history, and  epidemiological information. The expected result is Negative.  Fact Sheet for Patients:  BloggerCourse.com  Fact Sheet for Healthcare Providers:  SeriousBroker.it  This test is no t yet approved or cleared by the [place] FDA and  has been authorized for detection and/or diagnosis of SARS-CoV-2 by FDA under an Emergency Use Authorization (EUA). This EUA will remain  in effect (meaning this test can be used) for the duration of the COVID-19 declaration under Section 564(b)(1) of the Act, 21 U.S.C.section 360bbb-3(b)(1), unless the authorization is terminated  or revoked sooner.       Influenza A by PCR NEGATIVE NEGATIVE Final   Influenza B by PCR NEGATIVE NEGATIVE Final    Comment: (NOTE) The Xpert Xpress SARS-CoV-2/FLU/RSV plus assay is intended as an aid in the diagnosis of influenza from Nasopharyngeal swab specimens and should not be used as a sole basis for treatment. Nasal washings and aspirates are unacceptable for Xpert Xpress SARS-CoV-2/FLU/RSV testing.  Fact Sheet for Patients: BloggerCourse.com  Fact Sheet for Healthcare Providers: SeriousBroker.it  This test is not yet approved or cleared by the [place] FDA and has been authorized for detection and/or diagnosis of SARS-CoV-2 by FDA under an Emergency Use Authorization (EUA). This EUA will remain in effect (meaning this test can be used) for the duration of the COVID-19 declaration under Section 564(b)(1) of the Act, 21 U.S.C. section 360bbb-3(b)(1), unless the  authorization is terminated or revoked.     Resp Syncytial Virus by PCR NEGATIVE NEGATIVE Final    Comment: (NOTE) Fact Sheet for Patients: BloggerCourse.com  Fact Sheet for Healthcare Providers: SeriousBroker.it  This test is not yet approved or cleared by the [place] FDA and has been  authorized for detection and/or diagnosis of SARS-CoV-2 by FDA under an Emergency Use Authorization (EUA). This EUA will remain in effect (meaning this test can be used) for the duration of the COVID-19 declaration under Section 564(b)(1) of the Act, 21 U.S.C. section 360bbb-3(b)(1), unless the authorization is terminated or revoked.  Performed at [facility], [address] [place], [place] [address]   Culture, blood (routine x 2)     Status: None (Preliminary result)   Collection Time: 01/09/24 11:35 PM   Specimen: BLOOD  Result Value Ref Range Status   Specimen Description   Final    BLOOD Performed at [facility], [address], [place], [place] [address]    Special Requests   Final    BOTTLES DRAWN AEROBIC AND ANAEROBIC Blood Culture results may not be optimal due to an inadequate volume of blood received in culture bottles Performed at [facility], [address] [place], [place] [address]    Culture   Final    NO GROWTH 2 DAYS Performed at [facility], [address], [place], [place] [address]    Report Status PENDING  Incomplete  Culture, blood (routine x 2)     Status: None (Preliminary result)   Collection Time: 01/10/24 12:29 AM   Specimen: BLOOD  Result Value Ref Range Status   Specimen Description   Final    BLOOD LEFT ANTECUBITAL Performed at [facility], [address] [place], [place] [address]    Special Requests   Final    BOTTLES DRAWN AEROBIC AND ANAEROBIC Blood Culture adequate volume Performed at [facility], [address], [place], [place] [address]    Culture   Final    NO GROWTH 2 DAYS Performed at [facility], [address], [place], [place] [address]    Report Status PENDING  Incomplete     Time coordinating discharge: 38 minutes  SIGNED:   Alwyn Ren, MD  [facility] 01/12/2024, 3:02 PM

## 2024-01-12 NOTE — Evaluation (Signed)
 Occupational Therapy Evaluation Patient Details Name: [name] MRN: [id number] DOB: December 22, 1950 Today's Date: 01/12/2024   History of Present Illness   Pt is a 73yo male presenting to [facility] ED with seizure lasting , vomiting, found to have pneumonia; of note recent hospitalization for flu , negative covid and flu this stay. Requires supplemental O2. CT head negative for acute abnormalities. PMH: ETOH, chronic hep C, dementia, gait disorder, CVA with L hemiparesis, seizures, L-THA, GERD     Clinical Impressions OT eval complete. Pt has care at home.  No further OT needs based on evaluation      If plan is discharge home, recommend the following:   A little help with walking and/or transfers;A little help with bathing/dressing/bathroom     Functional Status Assessment   Patient has had a recent decline in their functional status and demonstrates the ability to make significant improvements in function in a reasonable and predictable amount of time.     Equipment Recommendations   None recommended by OT     Recommendations for Other Services         Precautions/Restrictions   Precautions Precautions: Fall Restrictions Weight Bearing Restrictions Per Provider Order: No     Mobility Bed Mobility Overal bed mobility: Needs Assistance Bed Mobility: Supine to Sit     Supine to sit: Contact guard     General bed mobility comments: Increased time    Transfers Overall transfer level: Needs assistance Equipment used: 1 person hand held assist Transfers: Sit to/from Stand, Bed to chair/wheelchair/BSC Sit to Stand: Min assist                      ADL either performed or assessed with clinical judgement   ADL Overall ADL's : Needs assistance/impaired Eating/Feeding: Set up;Sitting   Grooming: Set up;Sitting;Wash/dry hands;Wash/dry face   Upper Body Bathing: Sitting;Minimal assistance   Lower Body Bathing: Minimal assistance;Sit to/from  stand;Cueing for safety;Cueing for sequencing   Upper Body Dressing : Minimal assistance;Sitting   Lower Body Dressing: Minimal assistance;Sit to/from stand;Cueing for safety;Cueing for sequencing   Toilet Transfer: Contact guard assist;Stand-pivot   Toileting- Clothing Manipulation and Hygiene: Minimal assistance;Sit to/from stand         General ADL Comments: pt has care at home from family.  Do not feel OT follow needed     Vision   Vision Assessment?: No apparent visual deficits     Perception         Praxis         Pertinent Vitals/Pain       Extremity/Trunk Assessment Upper Extremity Assessment LUE Coordination: decreased fine motor   Lower Extremity Assessment LLE Coordination: decreased fine motor   Cervical / Trunk Assessment Cervical / Trunk Assessment: Normal   Communication Communication Communication: Impaired Factors Affecting Communication: Difficulty expressing self   Cognition Arousal: Alert Behavior During Therapy: WFL for tasks assessed/performed                                 Following commands: Intact       Cueing  General Comments   Cueing Techniques: Verbal cues;Tactile cues              Home Living Family/patient expects to be discharged to:: Private residence Living Arrangements: Other relatives Available Help at Discharge: Family;Available 24 hours/day Type of Home: Apartment Home Access: Level entry     Home Layout:  One level     Bathroom Shower/Tub: Tub/shower unit         Home Equipment: Cane - single point          Prior Functioning/Environment Prior Level of Function : Needs assist  Cognitive Assist : Mobility (cognitive);ADLs (cognitive) Mobility (Cognitive): Set up cues ADLs (Cognitive): Set up cues       Mobility Comments: uses a cane at baseline , able to  ambulate with SPC, no needed assistance ADLs Comments: able to get own shower, per sister.    OT Problem List:  Decreased strength   OT Treatment/Interventions:        OT Goals(Current goals can be found in the care plan section)   Acute Rehab OT Goals Patient Stated Goal: home with brother OT Goal Formulation: With patient Time For Goal Achievement: 01/12/24 Potential to Achieve Goals: Good   OT Frequency:          AM-PAC OT "6 Clicks" Daily Activity     Outcome Measure Help from another person eating meals?: None Help from another person taking care of personal grooming?: A Little Help from another person toileting, which includes using toliet, bedpan, or urinal?: A Little Help from another person bathing (including washing, rinsing, drying)?: A Little Help from another person to put on and taking off regular upper body clothing?: A Little Help from another person to put on and taking off regular lower body clothing?: A Little 6 Click Score: 19   End of Session Equipment Utilized During Treatment: Gait belt Nurse Communication: Mobility status  Activity Tolerance: Patient tolerated treatment well Patient left: in bed;with call bell/phone within reach  OT Visit Diagnosis: Unsteadiness on feet (R26.81);Muscle weakness (generalized) (M62.81)                Time: 1020-1035 OT Time Calculation (min): 15 min Charges:  OT General Charges $OT Visit: 1 Visit OT Evaluation $OT Eval Low Complexity: 1 Low   Remmy Crass, [place] Kung 01/12/2024, 12:18 PM

## 2024-01-12 NOTE — TOC Initial Note (Signed)
 Transition of Care [facility]) - Initial/Assessment Note    Patient Details  Name: [name] MRN: [id number] Date of Birth: 1951/06/19  [facility]) CM/SW Contact:    Larrie Kass, LCSW Phone Number: 01/12/2024, 9:22 AM  Clinical Narrative:                  Pt to d/c home with home health services through [facility]. CSW spoke with pt's nephew Judie Grieve he reports he will be picking pt up upon d/c. No further TOC needs TOC sign off.    Barriers to Discharge: Barriers Resolved   Patient Goals and CMS Choice Patient states their goals for this hospitalization and ongoing recovery are:: return home          Expected Discharge Plan and Services         Expected Discharge Date: 01/12/24                                    Prior Living Arrangements/Services                       Activities of Daily Living   ADL Screening (condition at time of admission) Independently performs ADLs?: No Does the patient have a NEW difficulty with bathing/dressing/toileting/self-feeding that is expected to last >3 days?: Yes (Initiates electronic notice to provider for possible OT consult) Does the patient have a NEW difficulty with getting in/out of bed, walking, or climbing stairs that is expected to last >3 days?: Yes (Initiates electronic notice to provider for possible PT consult) Does the patient have a NEW difficulty with communication that is expected to last >3 days?: Yes (Initiates electronic notice to provider for possible SLP consult) Is the patient deaf or have difficulty hearing?: No Does the patient have difficulty seeing, even when wearing glasses/contacts?: No Does the patient have difficulty concentrating, remembering, or making decisions?: No  Permission Sought/Granted                  Emotional Assessment              Admission diagnosis:  Seizure (HCC) [R56.9] Pneumonia [J18.9] Renal insufficiency [N28.9] Elevated random blood  glucose level [R73.9] Community acquired pneumonia, unspecified laterality [J18.9] Patient Active Problem List   Diagnosis Date Noted   Community acquired pneumonia 01/10/2024   ARF (acute renal failure) (HCC) 01/10/2024   GERD (gastroesophageal reflux disease) 01/10/2024   Pneumonia 01/10/2024   Thrombocytopenia (HCC) 12/29/2023   Acute metabolic encephalopathy 12/29/2023   Liver mass 12/29/2023   Influenza A 12/29/2023   Seizure disorder (HCC) 12/29/2023   Dementia (HCC) 12/29/2023   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   Vascular dementia with other behavioral disturbance, unspecified dementia severity (HCC) 08/20/2023   Pain due to onychomycosis of toenails of both feet 02/28/2023   Sepsis (HCC) 02/23/2023   Barrett's esophagus 12/25/2021   Bell's palsy 08/23/2020   Elevated alkaline phosphatase level 11/17/2019   Adenomatous polyp of colon 09/02/2017   Functional urinary incontinence 09/02/2017   Hemiparesis and alteration of sensations as late effects of stroke (HCC) 01/25/2017   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/23/2015   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 [place] [address] Phone: [phone] Fax: [phone]     Social Drivers of Health (SDOH) Social History: SDOH Screenings   Food Insecurity: No Food Insecurity (01/10/2024)  Housing: Low Risk  (01/10/2024)  Transportation Needs: No Transportation Needs (01/10/2024)  Utilities: Not At Risk (01/10/2024)  Alcohol Screen: Low Risk   (06/25/2023)  Depression (PHQ2-9): Low Risk  (06/25/2023)  Financial Resource Strain: Low Risk  (06/25/2023)  Physical Activity: Inactive (06/25/2023)  Social Connections: Moderately Isolated (01/10/2024)  Stress: No Stress Concern Present (06/25/2023)  Tobacco Use: Medium Risk (01/10/2024)  Health Literacy: Adequate Health Literacy (06/25/2023)   SDOH Interventions: Utilities Interventions: Intervention Not Indicated   Readmission Risk Interventions     No data to display

## 2024-01-13 ENCOUNTER — Telehealth: Payer: Self-pay

## 2024-01-13 NOTE — Transitions of Care (Post Inpatient/ED Visit) (Signed)
   01/13/2024  Name: [name] MRN: [id number] DOB: 1951/07/17  Today's TOC FU Call Status: Today's TOC FU Call Status:: Unsuccessful Call (1st Attempt) Unsuccessful Call (1st Attempt) Date: 01/13/24  Attempted to reach the patient regarding the most recent Inpatient/ED visit.  Follow Up Plan: Additional outreach attempts will be made to reach the patient to complete the Transitions of Care (Post Inpatient/ED visit) call.   Signature  Robyne Peers, RN

## 2024-01-13 NOTE — Transitions of Care (Post Inpatient/ED Visit) (Signed)
   01/13/2024  Name: [name] MRN: [id number] DOB: 11/19/1950  Today's TOC FU Call Status: Unsuccessful Call (1st Attempt) Date: 01/13/24  Attempted to reach the patient regarding the most recent Inpatient/ED visit.  Follow Up Plan: Additional outreach attempts will be made to reach the patient to complete the Transitions of Care (Post Inpatient/ED visit) call.   Hilbert Odor RN, CCM [place]  [facility]BC[facility]Population Health RN Care Manager [phone]

## 2024-01-14 ENCOUNTER — Telehealth: Payer: Self-pay

## 2024-01-14 NOTE — Transitions of Care (Post Inpatient/ED Visit) (Signed)
   01/14/2024  Name: TANAV ORSAK MRN: [id number] DOB: April 28, 1951  Today's TOC FU Call Status: Today's TOC FU Call Status:: Unsuccessful Call (2nd Attempt) Unsuccessful Call (2nd Attempt) Date: 01/14/24  Attempted to reach the patient regarding the most recent Inpatient/ED visit.  Follow Up Plan: Additional outreach attempts will be made to reach the patient to complete the Transitions of Care (Post Inpatient/ED visit) call.   Vershawn Westrup A. Mliss Fritz RN, BA, [facility], CRRN [facility] RN Care Manager, Transition of Care [phone]

## 2024-01-14 NOTE — Transitions of Care (Post Inpatient/ED Visit) (Signed)
 01/14/2024  Name: [name] MRN: [id number] DOB: [birth year]  Today's TOC FU Call Status: Today's TOC FU Call Status:: Successful TOC FU Call Completed TOC FU Call Complete Date: 01/14/24 Patient's Name and Date of Birth confirmed.  Transition Care Management Follow-up Telephone Call Date of Discharge: 01/12/24 Discharge Facility: [facility]) Type of Discharge: Inpatient Admission Primary Inpatient Discharge Diagnosis:: Commnity Acquired Pneumonia How have you been since you were released from the hospital?: Better Any questions or concerns?: No  Items Reviewed: Did you receive and understand the discharge instructions provided?: Yes Medications obtained,verified, and reconciled?: Yes (Medications Reviewed) Any new allergies since your discharge?: No Dietary orders reviewed?: Yes Type of Diet Ordered:: Low Sodium, Heart Healthy diet Do you have support at home?: Yes People in Home: other relative(s), sibling(s) Name of Support/Comfort Primary Source: lives with his sister and nephew [name]  Medications Reviewed Today: Medications Reviewed Today     Reviewed by Joshua Eke, RN (Registered Nurse) on 01/14/24 at 1301  Med List Status: <None>   Medication Order Taking? Sig Documenting Provider Last Dose Status Informant  amoxicillin-clavulanate (AUGMENTIN) 875-125 MG tablet [id number] Yes Take 1 tablet by mouth 2 (two) times daily. Alwyn Ren, MD Taking Active   aspirin EC 81 MG tablet [id number] Yes Take 1 tablet (81 mg total) by mouth daily. Marcine Matar, MD Taking Active Family Member  guaiFENesin [facility]) 600 MG 12 hr tablet [id number] Yes Take 1 tablet (600 mg total) by mouth 2 (two) times daily. Deanna Artis, DO Taking Active Family Member  lacosamide 150 MG TABS [id number] Yes Take 1 tablet (150 mg total) by mouth 2 (two) times daily. Alwyn Ren, MD Taking Active   levETIRAcetam (KEPPRA) 750 MG tablet [id number] Yes Take 2 tablets  (1,500 mg total) by mouth 2 (two) times daily.  Patient taking differently: Take 1,500 mg by mouth See admin instructions. Take 1,500 mg by mouth in the morning and evening   Marcine Matar, MD Taking Active Family Member  memantine [facility]) 10 MG tablet [id number] Yes Take 1 tablet (10 mg total) by mouth 2 (two) times daily.  Patient taking differently: Take 10 mg by mouth in the morning and at bedtime.   Marcine Matar, MD Taking Active Family Member  ondansetron [facility]) 4 MG tablet [id number] Yes Take 1 tablet (4 mg total) by mouth every 6 (six) hours as needed for nausea. Deanna Artis, DO Taking Active Family Member  pantoprazole (PROTONIX) 40 MG tablet [id number] Yes Take 1 tablet (40 mg total) by mouth daily.  Patient taking differently: Take 40 mg by mouth daily before breakfast.   Marcine Matar, MD Taking Active Family Member  phenytoin (DILANTIN) 100 MG ER capsule [id number] Yes Take 2 capsules (200 mg total) by mouth at bedtime. FOR SEIZURES Anders Simmonds, [place] Taking Active Family Member  PHENYTOIN INFATABS 50 MG tablet [id number] Yes Chew 25 mg by mouth at bedtime. [provider] Taking Active Family Member  rosuvastatin (CRESTOR) 10 MG tablet [id number] Yes Take 1 tablet (10 mg total) by mouth daily. STOP Simvastatin Marcine Matar, MD Taking Active Family Member  Med List Note Lenor Derrick, CPhT 02/23/23 1806): Pt nephew [name] helps with patient's medication            Home Care and Equipment/Supplies: Were Home Health Services Ordered?: Yes Name of Home Health Agency:: [facility] Has Agency set up a time to come to your home?:  Yes Any new equipment or medical supplies ordered?: No  Functional Questionnaire: Do you need assistance with bathing/showering or dressing?: Yes (nephew Joshua and other family members help with patient's ADLs as needed) Do you need assistance with meal preparation?: No Do you need assistance with  eating?: No Do you have difficulty maintaining continence: Yes (nephew Joshua and other family members help with patient's ADLs as needed) Do you need assistance with getting out of bed/getting out of a chair/moving?: Yes (nephew [name] and other family members help with patient's ADLs as needed) Do you have difficulty managing or taking your medications?: Yes (Patient's family, in particular, nephew Joshua manages patient's meds)  Follow up appointments reviewed: PCP Follow-up appointment confirmed?: Yes Date of PCP follow-up appointment?: 02/21/24 Follow-up Provider: Dr. Laural Benes (nephew did not want earlier appt for patient) Do you need transportation to your follow-up appointment?: No Do you understand care options if your condition(s) worsen?: Yes-patient verbalized understanding   Barrie Sigmund A. Mliss Fritz RN, BA, [facility], CRRN [facility] RN Care Manager, Transition of Care [phone]

## 2024-01-15 ENCOUNTER — Telehealth: Payer: Self-pay

## 2024-01-15 LAB — CULTURE, BLOOD (ROUTINE X 2)
Culture: NO GROWTH
Culture: NO GROWTH
Special Requests: ADEQUATE

## 2024-01-15 NOTE — Telephone Encounter (Signed)
 Copied from CRM [id number]. Topic: Clinical - Home Health Verbal Orders >> Jan 14, 2024  5:10 PM Ja-Kwan M wrote: Caller/Agency: Brett Canales with [facility]  Callback Number: [phone] Service Requested: Re-admission  Brett Canales reports that they were not able to readmit patient because he has a relative with Covid so patient will be re-admitted on Thursday 01/16/24.

## 2024-01-16 ENCOUNTER — Telehealth: Payer: Self-pay | Admitting: Internal Medicine

## 2024-01-16 ENCOUNTER — Inpatient Hospital Stay: Admitting: Physician Assistant

## 2024-01-16 DIAGNOSIS — J9601 Acute respiratory failure with hypoxia: Secondary | ICD-10-CM | POA: Diagnosis not present

## 2024-01-16 DIAGNOSIS — N179 Acute kidney failure, unspecified: Secondary | ICD-10-CM | POA: Diagnosis not present

## 2024-01-16 DIAGNOSIS — I69354 Hemiplegia and hemiparesis following cerebral infarction affecting left non-dominant side: Secondary | ICD-10-CM | POA: Diagnosis not present

## 2024-01-16 DIAGNOSIS — G9341 Metabolic encephalopathy: Secondary | ICD-10-CM | POA: Diagnosis not present

## 2024-01-16 NOTE — Telephone Encounter (Signed)
 Copied from CRM [id number]. Topic: Clinical - Home Health Verbal Orders >> Jan 16, 2024  1:37 PM Alessandra Bevels wrote: Brett Canales calling from [facility] is calling for Verbal Order for PT. Frequency- PT 2 W 2 , 1 W 2 CB- [phone] Verbal ok on VM

## 2024-01-21 NOTE — Telephone Encounter (Signed)
 Called & spoke to steve. Verbal order given.

## 2024-01-22 DIAGNOSIS — G9341 Metabolic encephalopathy: Secondary | ICD-10-CM | POA: Diagnosis not present

## 2024-01-22 DIAGNOSIS — J9601 Acute respiratory failure with hypoxia: Secondary | ICD-10-CM | POA: Diagnosis not present

## 2024-01-22 DIAGNOSIS — I69354 Hemiplegia and hemiparesis following cerebral infarction affecting left non-dominant side: Secondary | ICD-10-CM | POA: Diagnosis not present

## 2024-01-22 DIAGNOSIS — N179 Acute kidney failure, unspecified: Secondary | ICD-10-CM | POA: Diagnosis not present

## 2024-02-09 ENCOUNTER — Other Ambulatory Visit: Payer: Self-pay

## 2024-02-09 ENCOUNTER — Emergency Department (HOSPITAL_COMMUNITY)

## 2024-02-09 ENCOUNTER — Emergency Department (HOSPITAL_COMMUNITY)
Admission: EM | Admit: 2024-02-09 | Discharge: 2024-02-09 | Disposition: A | Discharge status: Home / Self Care | Attending: Emergency Medicine | Admitting: Emergency Medicine

## 2024-02-09 ENCOUNTER — Encounter (HOSPITAL_COMMUNITY): Payer: Self-pay | Admitting: Emergency Medicine

## 2024-02-09 DIAGNOSIS — R052 Subacute cough: Secondary | ICD-10-CM | POA: Insufficient documentation

## 2024-02-09 DIAGNOSIS — R059 Cough, unspecified: Secondary | ICD-10-CM | POA: Diagnosis not present

## 2024-02-09 LAB — COMPREHENSIVE METABOLIC PANEL WITH GFR
ALT: 19 U/L (ref 0–44)
AST: 18 U/L (ref 15–41)
Albumin: 3.6 g/dL (ref 3.5–5.0)
Alkaline Phosphatase: 121 U/L (ref 38–126)
Anion gap: 8 (ref 5–15)
BUN: 14 mg/dL (ref 8–23)
CO2: 24 mmol/L (ref 22–32)
Calcium: 9 mg/dL (ref 8.9–10.3)
Chloride: 108 mmol/L (ref 98–111)
Creatinine, Ser: 0.9 mg/dL (ref 0.61–1.24)
GFR, Estimated: >60 mL/min (ref >60)
Glucose, Bld: 109 mg/dL — ABNORMAL HIGH (ref 70–99)
Potassium: 4.4 mmol/L (ref 3.5–5.1)
Sodium: 140 mmol/L (ref 135–145)
Total Bilirubin: 0.6 mg/dL (ref 0.0–1.2)
Total Protein: 7.2 g/dL (ref 6.5–8.1)

## 2024-02-09 LAB — CBC
HCT: 41.4 % (ref 39.0–52.0)
Hemoglobin: 13.7 g/dL (ref 13.0–17.0)
MCH: 32.3 pg (ref 26.0–34.0)
MCHC: 33.1 g/dL (ref 30.0–36.0)
MCV: 97.6 fL (ref 80.0–100.0)
Platelets: 180 10*3/uL (ref 150–400)
RBC: 4.24 MIL/uL (ref 4.22–5.81)
RDW: 12.8 % (ref 11.5–15.5)
WBC: 5.7 10*3/uL (ref 4.0–10.5)
nRBC: 0 % (ref 0.0–0.2)

## 2024-02-09 MED ORDER — GUAIFENESIN ER 600 MG PO TB12: 600.0000 mg | ORAL_TABLET | Freq: Two times a day (BID) | ORAL | 0 refills | Status: DC

## 2024-02-09 MED ORDER — BENZONATATE 100 MG PO CAPS: 100.0000 mg | ORAL_CAPSULE | Freq: Three times a day (TID) | ORAL | 0 refills | Status: DC

## 2024-02-09 NOTE — ED Provider Notes (Signed)
 [place] EMERGENCY DEPARTMENT AT [facility] Provider Note   CSN: [id number] Arrival date & time: 02/09/24  1203    History  Chief Complaint  Patient presents with   Cough    [name] is a 73 y.o. male here for evaluation of cough.  Family states he is hospitalized about 3 weeks ago for influenza.  He still has a cough.  He completed his cough medicine cough is not worse.  Cough is nonproductive.  No hemoptysis.  No fevers.  He is eating and drinking without difficulty.  No chest pain, shortness of breath, pain or swelling to lower extremities, PND, orthopnea, abdominal pain.  No nausea or vomiting.  No dyspnea on exertion.  Family was just concerned that he still has this cough which is why they led him here they want to make sure he does not need another antibiotic for his pneumonia.  No weakness.  Walks with a cane at baseline.  He is here with family.  Some clear rhinorrhea and sneezing  Level 5 caveat dementia  HPI     Home Medications Prior to Admission medications   Medication Sig Start Date End Date Taking? Authorizing Provider  benzonatate  (TESSALON ) 100 MG capsule Take 1 capsule (100 mg total) by mouth every 8 (eight) hours. 02/09/24  Yes Mattison Golay A, PA-C  amoxicillin -clavulanate (AUGMENTIN ) 875-125 MG tablet Take 1 tablet by mouth 2 (two) times daily. 01/12/24   Barbee Lew, MD  aspirin  EC 81 MG tablet Take 1 tablet (81 mg total) by mouth daily. 03/04/20   Lawrance Presume, MD  guaiFENesin  (MUCINEX ) 600 MG 12 hr tablet Take 1 tablet (600 mg total) by mouth 2 (two) times daily. 02/09/24   Saide Lanuza A, PA-C  lacosamide  150 MG TABS Take 1 tablet (150 mg total) by mouth 2 (two) times daily. 01/12/24   Barbee Lew, MD  levETIRAcetam  (KEPPRA ) 750 MG tablet Take 2 tablets (1,500 mg total) by mouth 2 (two) times daily. Patient taking differently: Take 1,500 mg by mouth See admin instructions. Take 1,500 mg by mouth in the morning and  evening 11/27/23   Lawrance Presume, MD  memantine  (NAMENDA ) 10 MG tablet Take 1 tablet (10 mg total) by mouth 2 (two) times daily. Patient taking differently: Take 10 mg by mouth in the morning and at bedtime. 11/27/23   Lawrance Presume, MD  ondansetron  (ZOFRAN ) 4 MG tablet Take 1 tablet (4 mg total) by mouth every 6 (six) hours as needed for nausea. 12/30/23   Jodeane Mulligan, DO  pantoprazole  (PROTONIX ) 40 MG tablet Take 1 tablet (40 mg total) by mouth daily. Patient taking differently: Take 40 mg by mouth daily before breakfast. 11/27/23   Lawrance Presume, MD  phenytoin  (DILANTIN ) 100 MG ER capsule Take 2 capsules (200 mg total) by mouth at bedtime. FOR SEIZURES 01/08/24   Hassie Lint, PA-C  PHENYTOIN  INFATABS 50 MG tablet Chew 25 mg by mouth at bedtime. 11/27/23   [provider]  rosuvastatin  (CRESTOR ) 10 MG tablet Take 1 tablet (10 mg total) by mouth daily. STOP Simvastatin  11/27/23   Lawrance Presume, MD      Allergies    Patient has no known allergies.    Review of Systems   Review of Systems  Constitutional: Negative.   HENT: Negative.    Respiratory:  Positive for cough. Negative for apnea, choking, chest tightness, shortness of breath and wheezing.   Cardiovascular: Negative.  Gastrointestinal: Negative.   Genitourinary: Negative.   Musculoskeletal: Negative.   Skin: Negative.   Neurological: Negative.   All other systems reviewed and are negative.  Physical Exam Updated Vital Signs BP 128/76 (BP Location: Left Arm)   Pulse 79   Temp 98.1 F (36.7 C) (Oral)   Resp 20   SpO2 95%  Physical Exam Vitals and nursing note reviewed.  Constitutional:      General: He is not in acute distress.    Appearance: He is well-developed. He is not ill-appearing, toxic-appearing or diaphoretic.  HENT:     Head: Atraumatic.     Nose: Nose normal.     Mouth/Throat:     Mouth: Mucous membranes are moist.  Eyes:     Pupils: Pupils are equal, round, and reactive to  light.  Cardiovascular:     Rate and Rhythm: Normal rate and regular rhythm.     Pulses:          Radial pulses are 2+ on the right side and 2+ on the left side.       Posterior tibial pulses are 1+ on the right side and 1+ on the left side.     Heart sounds: Normal heart sounds.  Pulmonary:     Effort: Pulmonary effort is normal. No respiratory distress.     Breath sounds: Normal breath sounds.     Comments: Clear bilaterally, speaks without difficulty Abdominal:     General: Bowel sounds are normal. There is no distension.     Palpations: Abdomen is soft.     Tenderness: There is no abdominal tenderness. There is no guarding or rebound.  Musculoskeletal:        General: No swelling, tenderness, deformity or signs of injury. Normal range of motion.     Cervical back: Normal range of motion and neck supple.     Right lower leg: No edema.     Left lower leg: No edema.     Comments: No bony tenderness, compartments soft, full range of motion without difficulty.  Homans negative bilaterally  Skin:    General: Skin is warm and dry.     Capillary Refill: Capillary refill takes less than 2 seconds.     Comments: No obvious rashes or lesions to exposed skin  Neurological:     General: No focal deficit present.     Mental Status: He is alert. Mental status is at baseline.     Comments: At baseline per family who is at bedside     ED Results / Procedures / Treatments   Labs (all labs ordered are listed, but only abnormal results are displayed) Labs Reviewed  COMPREHENSIVE METABOLIC PANEL WITH GFR - Abnormal; Notable for the following components:      Result Value   Glucose, Bld 109 (*)    All other components within normal limits  CBC    EKG None  Radiology DG Chest 2 View Result Date: 02/09/2024 CLINICAL DATA:  Cough. EXAM: CHEST - 2 VIEW COMPARISON:  Two-view chest x-ray 01/09/2024. FINDINGS: The heart size and mediastinal contours are within normal limits. Both lungs are  clear. The visualized skeletal structures are unremarkable. IMPRESSION: Negative two view chest x-ray. Electronically Signed   By: Audree Leas M.D.   On: 02/09/2024 13:10    Procedures Procedures    Medications Ordered in ED Medications - No data to display  ED Course/ Medical Decision Making/ A&P   73 year old here for evaluation of persistent cough over  the last 3 weeks or so.  Family states he was admitted for pneumonia and influenza.  He continues to have nonproductive cough.  No chest pain, shortness of breath.  No clinical evidence of VTE, considered PE however he has normal vital signs, no chest pain, shortness of breath I have low suspicion for this.  No fever at home, eating and drinking without difficulty.  No weakness.  Ambulatory with cane at baseline without any dyspnea on exertion.  He speaks in full sentences here.  Labs and imaging personally viewed and interpreted:  CBC without leukocytosis Metabolic panel glucose 109 Chest x-ray that cardiomegaly, pulm edema, pneumothorax  Discussed results with patient, family at bedside.  Suspect he likely continues have a postviral cough.  Low suspicion for acute ACS, PE, dissection, fluid overload, pneumothorax, unstable angina, effusion, persistent bacterial infection, pulmonary edema.  Will have close follow-up outpatient, return for any worsening symptoms.  The patient has been appropriately medically screened and/or stabilized in the ED. I have low suspicion for any other emergent medical condition which would require further screening, evaluation or treatment in the ED or require inpatient management.  Patient is hemodynamically stable and in no acute distress.  Patient able to ambulate in department prior to ED.  Evaluation does not show acute pathology that would require ongoing or additional emergent interventions while in the emergency department or further inpatient treatment.  I have discussed the diagnosis with the  patient and answered all questions.  Pain is been managed while in the emergency department and patient has no further complaints prior to discharge.  Patient is comfortable with plan discussed in room and is stable for discharge at this time.  I have discussed strict return precautions for returning to the emergency department.  Patient was encouraged to follow-up with PCP/specialist refer to at discharge.                                Medical Decision Making Amount and/or Complexity of Data Reviewed Independent Historian: friend External Data Reviewed: labs, radiology and notes. Labs: ordered. Decision-making details documented in ED Course. Radiology: ordered and independent interpretation performed. Decision-making details documented in ED Course.  Risk OTC drugs. Prescription drug management. Decision regarding hospitalization. Diagnosis or treatment significantly limited by social determinants of health.         Final Clinical Impression(s) / ED Diagnoses Final diagnoses:  Subacute cough    Rx / DC Orders ED Discharge Orders          Ordered    guaiFENesin  (MUCINEX ) 600 MG 12 hr tablet  2 times daily        02/09/24 1516    benzonatate  (TESSALON ) 100 MG capsule  Every 8 hours        02/09/24 1516              Skylor Hughson A, PA-C 02/09/24 1849    Jerilynn Montenegro, MD 02/09/24 2333

## 2024-02-09 NOTE — ED Triage Notes (Signed)
 Pt reports cough and family reports ":we just want to make sure everything is good." Pt recently admitted for pneumonia.

## 2024-02-09 NOTE — Discharge Instructions (Signed)
 It was a pleasure taking care of [name] today.  I have written for a medication to help with his cough.  Make sure to follow-up outpatient, return for any worsening symptoms

## 2024-02-10 ENCOUNTER — Telehealth: Payer: Self-pay

## 2024-02-10 NOTE — Transitions of Care (Post Inpatient/ED Visit) (Signed)
   02/10/2024  Name: [name] MRN: [id number] DOB: Jan 22, 1951  Today's TOC FU Call Status: Today's TOC FU Call Status:: Unsuccessful Call (1st Attempt) Unsuccessful Call (1st Attempt) Date: 02/10/24  Attempted to reach the patient regarding the most recent Inpatient/ED visit.  Follow Up Plan: Additional outreach attempts will be made to reach the patient to complete the Transitions of Care (Post Inpatient/ED visit) call.   Signature  Seabron [place], LPN [facility] Advisor [place] You Are. We Are. One [facility] Direct Dial [phone]

## 2024-02-13 ENCOUNTER — Other Ambulatory Visit: Payer: Self-pay | Admitting: Internal Medicine

## 2024-02-13 DIAGNOSIS — R252 Cramp and spasm: Secondary | ICD-10-CM

## 2024-02-14 DIAGNOSIS — J9601 Acute respiratory failure with hypoxia: Secondary | ICD-10-CM | POA: Diagnosis not present

## 2024-02-14 DIAGNOSIS — N179 Acute kidney failure, unspecified: Secondary | ICD-10-CM | POA: Diagnosis not present

## 2024-02-14 DIAGNOSIS — G9341 Metabolic encephalopathy: Secondary | ICD-10-CM | POA: Diagnosis not present

## 2024-02-14 DIAGNOSIS — I69354 Hemiplegia and hemiparesis following cerebral infarction affecting left non-dominant side: Secondary | ICD-10-CM | POA: Diagnosis not present

## 2024-02-17 ENCOUNTER — Telehealth: Payer: Self-pay | Admitting: Internal Medicine

## 2024-02-17 ENCOUNTER — Other Ambulatory Visit: Payer: Self-pay | Admitting: Internal Medicine

## 2024-02-17 DIAGNOSIS — R569 Unspecified convulsions: Secondary | ICD-10-CM

## 2024-02-17 DIAGNOSIS — I69359 Hemiplegia and hemiparesis following cerebral infarction affecting unspecified side: Secondary | ICD-10-CM

## 2024-02-17 DIAGNOSIS — K219 Gastro-esophageal reflux disease without esophagitis: Secondary | ICD-10-CM

## 2024-02-17 NOTE — Telephone Encounter (Signed)
 Copied from CRM [id number]. Topic: Clinical - Medication Refill >> Feb 17, 2024 10:23 AM Bambi Bonine D wrote: Most Recent Primary Care Visit:  Provider: Hassie Lint  Department: [facility]  Visit Type: HOSPITAL FOLLOW UP  Date: 01/08/2024  Medication: amoxicillin -clavulanate (AUGMENTIN ) 875-125 MG tablet, benzonatate  (TESSALON ) 100 MG capsule, guaiFENesin  (MUCINEX ) 600 MG 12 hr tablet, lacosamide  150 MG TABS, levETIRAcetam  (KEPPRA ) 750 MG tablet, memantine  (NAMENDA ) 10 MG tablet, ondansetron  (ZOFRAN ) 4 MG tablet, pantoprazole  (PROTONIX ) 40 MG tablet, phenytoin  (DILANTIN ) 100 MG ER capsule, PHENYTOIN  INFATABS 50 MG tablet, rosuvastatin  (CRESTOR ) 10 MG tablet    Has the patient contacted their pharmacy? Yes (Agent: If no, request that the patient contact the pharmacy for the refill. If patient does not wish to contact the pharmacy document the reason why and proceed with request.) (Agent: If yes, when and what did the pharmacy advise?)  Is this the correct pharmacy for this prescription? Yes If no, delete pharmacy and type the correct one.  This is the patient's preferred pharmacy:  [facility] [phone] Jonette Nestle, [place] - [address] [place] [address] Phone: [phone] Fax: [phone]   Has the prescription been filled recently? No  Is the patient out of the medication? Yes  Has the patient been seen for an appointment in the last year OR does the patient have an upcoming appointment? Yes  Can we respond through MyChart? No  Agent: Please be advised that Rx refills may take up to 3 business days. We ask that you follow-up with your pharmacy.

## 2024-02-17 NOTE — Telephone Encounter (Signed)
 Copied from CRM [id number]. Topic: Clinical - Medication Question  >> Feb 17, 2024 10:30 AM Bambi Bonine D wrote: Reason for CRM: Patient's nephew called and requested a medication refill request for all of the patient's medications. Patient stated that it is urgent that the medications are refilled and sent to the pharmacy today because if not the patient will have a seizure. Nephew stated that the patient is out of his seizure medications.

## 2024-02-17 NOTE — Telephone Encounter (Signed)
 Called & spoke to the patient's nephew. Informed that refills were recently sent to the pharmacy and must contact pharmacy for refills. Nephew expressed verbal understanding.

## 2024-02-19 MED ORDER — PHENYTOIN SODIUM EXTENDED 100 MG PO CAPS: 200.0000 mg | ORAL_CAPSULE | Freq: Every day | ORAL | 2 refills | Status: AC

## 2024-02-19 MED ORDER — PANTOPRAZOLE SODIUM 40 MG PO TBEC: 40.0000 mg | DELAYED_RELEASE_TABLET | Freq: Every day | ORAL | 0 refills | Status: DC

## 2024-02-19 MED ORDER — BENZONATATE 100 MG PO CAPS: 100.0000 mg | ORAL_CAPSULE | Freq: Three times a day (TID) | ORAL | Status: DC | PRN

## 2024-02-19 MED ORDER — PHENYTOIN INFATABS 50 MG PO CHEW: 25.0000 mg | CHEWABLE_TABLET | Freq: Every day | ORAL | 1 refills | Status: DC

## 2024-02-19 MED ORDER — MEMANTINE HCL 10 MG PO TABS: 10.0000 mg | ORAL_TABLET | Freq: Two times a day (BID) | ORAL | 0 refills | Status: DC

## 2024-02-19 NOTE — Telephone Encounter (Signed)
 Requested medication (s) are due for refill today: 1. Amox-clavulnate: 2 benzonatate  3 guaifenesin  4 lacosamide  (not due) 5: Zofran ,  6.phenytoin  (Not due)  7. Phenytoin  infatabs   Requested medication (s) are on the active medication list: Phenytoin  hx med  Last refill:  Amox-clavulnate: 01/12/24 #10  Benzonatate : 02/09/24 #21 Guaifenesin : 02/09/24 #30 Lacosemide: 01/12/24 #60 4 RF Zofran : 12/30/23 #20 Phenytoin : 01/08/24 # 180 1 RF Phenytoin  Infatabs: 12/29/23   Future visit scheduled: yes  Notes to clinic:  Amox-clavulnate, Benzonatate , Lacosemide.: all written at hospital discharge or ED visit   Requested Prescriptions  Pending Prescriptions Disp Refills   amoxicillin -clavulanate (AUGMENTIN ) 875-125 MG tablet 10 tablet     Sig: Take 1 tablet by mouth 2 (two) times daily.     Off-Protocol Failed - 02/19/2024 12:03 PM      Failed - Medication not assigned to a protocol, review manually.      Passed - Valid encounter within last 12 months    Recent Outpatient Visits           1 month ago Influenza A   [place] Comm Health Wellnss - A Dept Of [place]. [facility] [place], [place], [place]   5 months ago Seizure disorder [facility])   [place] Comm Health Vivien Grout - A Dept Of [place]. [facility] Collins Dean, NP   6 months ago Seizure [facility])   [place] Comm Health Vivien Grout - A Dept Of [place]. [facility] Lawrance Presume, MD   9 months ago Seizure [facility])   [place] Comm Health Monument - A Dept Of [place]. [facility] [place], [place], [place]   1 year ago Leg cramps   [place] Comm Health Dewy Rose - A Dept Of [place]. [facility] Collins Dean, NP       Future Appointments             In 2 days Lawrance Presume, MD [facility] Health Comm Health Landmark - A Dept Of Tommas Fragmin. [facility]             benzonatate  (TESSALON ) 100 MG capsule 21 capsule     Sig: Take 1 capsule (100 mg total) by mouth every 8  (eight) hours.     Ear, Nose, and Throat:  Antitussives/Expectorants Passed - 02/19/2024 12:03 PM      Passed - Valid encounter within last 12 months    Recent Outpatient Visits           1 month ago Influenza A   [place] Comm Health [place] - A Dept Of [place]. [facility] [place], [place], [place]   5 months ago Seizure disorder [facility])   [place] Comm Health Vivien Grout - A Dept Of [place]. [facility] Collins Dean, NP   6 months ago Seizure [facility])   [place] Comm Health Vivien Grout - A Dept Of [place]. [facility] Lawrance Presume, MD   9 months ago Seizure [facility])   [place] Comm Health Hanging Rock - A Dept Of [place]. [facility] [place], [place], [place]   1 year ago Leg cramps   [place] Comm Health Elk Point - A Dept Of [place]. [facility] Collins Dean, NP       Future Appointments             In 2 days Lawrance Presume, MD [facility] Health  Comm Health Boeing - A Dept Of [place]. [facility]             guaiFENesin  (MUCINEX ) 600 MG 12 hr tablet 30 tablet     Sig: Take 1 tablet (600 mg total) by mouth 2 (two) times daily.     Over the Counter:  OTC Passed - 02/19/2024 12:03 PM      Passed - Valid encounter within last 12 months    Recent Outpatient Visits           1 month ago Influenza A   [place] Comm Health [place] - A Dept Of [place]. [facility] [place], [place], [place]   5 months ago Seizure disorder [facility])   [place] Comm Health Vivien Grout - A Dept Of [place]. [facility] Collins Dean, NP   6 months ago Seizure [facility])   [place] Comm Health Vivien Grout - A Dept Of [place]. [facility] Lawrance Presume, MD   9 months ago Seizure [facility])   [place] Comm Health Turpin Hills - A Dept Of [place]. [facility] [place], [place], [place]   1 year ago Leg cramps   [place] Comm Health Saint [place] - A Dept Of [place]. [facility] Collins Dean, NP       Future Appointments             In 2 days Lawrance Presume, MD [facility] Health Comm Health [place] - A Dept Of Tommas Fragmin. [facility]             Lacosamide  150 MG TABS 60 tablet     Sig: Take 1 tablet (150 mg total) by mouth 2 (two) times daily.     Not Delegated - Neurology:  Anticonvulsants - Controlled - lacosamide  Failed - 02/19/2024 12:03 PM      Failed - This refill cannot be delegated      Passed - Cr in normal range and within 360 days    Creat  Date Value Ref Range Status  04/08/2017 0.93 0.70 - 1.25 mg/dL Final    Comment:      For patients > or = 71 years of age: The upper reference limit for Creatinine is approximately 13% higher for people identified as African-American.      Creatinine, Ser  Date Value Ref Range Status  02/09/2024 0.90 0.61 - 1.24 mg/dL Final         Passed - AST in normal range and within 360 days    AST  Date Value Ref Range Status  02/09/2024 18 15 - 41 U/L Final         Passed - ALT in normal range and within 360 days    ALT  Date Value Ref Range Status  02/09/2024 19 0 - 44 U/L Final         Passed - Completed PHQ-2 or PHQ-9 in the last 360 days      Passed - Valid encounter within last 12 months    Recent Outpatient Visits           1 month ago Influenza A   [place] Comm Health Bienville - A Dept Of . [facility] [place], [place], [place]   5 months ago Seizure disorder [facility])   [place] Comm Health Vivien Grout - A Dept Of . [facility] Collins Dean, NP   6 months ago  Seizure [facility])   [place] Comm Health Vivien Grout - A Dept Of [place]. [facility] Lawrance Presume, MD   9 months ago Seizure [facility])   [place] Comm Health Novice - A Dept Of [place]. [facility] [place], [place], [place]   1 year ago Leg cramps   [place] Comm Health Claremont - A Dept Of [place]. [facility] Collins Dean, NP       Future Appointments              In 2 days Lawrance Presume, MD [facility] Health Comm Health Cross Village - A Dept Of Tommas Fragmin. [facility]             ondansetron  (ZOFRAN ) 4 MG tablet 20 tablet     Sig: Take 1 tablet (4 mg total) by mouth every 6 (six) hours as needed for nausea.     Not Delegated - Gastroenterology: Antiemetics - ondansetron  Failed - 02/19/2024 12:03 PM      Failed - This refill cannot be delegated      Failed - Valid encounter within last 6 months    Recent Outpatient Visits           1 month ago Influenza A   [place] Comm Health Wellnss - A Dept Of [place]. [facility] [place], [place], [place]   5 months ago Seizure disorder [facility])   [place] Comm Health Vivien Grout - A Dept Of [place]. [facility] Collins Dean, NP   6 months ago Seizure [facility])   [place] Comm Health Vivien Grout - A Dept Of . [facility] Lawrance Presume, MD   9 months ago Seizure [facility])   [place] Comm Health Mingo Junction - A Dept Of . [facility] [place], [place], [place]   1 year ago Leg cramps   [place] Comm Health [place] - A Dept Of . [facility] Collins Dean, NP       Future Appointments             In 2 days Lawrance Presume, MD [facility] Health Comm Health Seneca Knolls - A Dept Of Tommas Fragmin. [facility]            Passed - AST in normal range and within 360 days    AST  Date Value Ref Range Status  02/09/2024 18 15 - 41 U/L Final         Passed - ALT in normal range and within 360 days    ALT  Date Value Ref Range Status  02/09/2024 19 0 - 44 U/L Final          phenytoin  (DILANTIN ) 100 MG ER capsule 180 capsule     Sig: Take 2 capsules (200 mg total) by mouth at bedtime. FOR SEIZURES     Not Delegated - Neurology:  Anticonvulsants - phenytoin  Failed - 02/19/2024 12:03 PM      Failed - This refill cannot be delegated      Passed - ALT in normal range and within 360 days    ALT  Date Value Ref  Range Status  02/09/2024 19 0 - 44 U/L Final         Passed - AST in normal range and within 360 days    AST  Date Value Ref Range Status  02/09/2024 18 15 - 41 U/L Final  Passed - HGB in normal range and within 360 days    Hemoglobin  Date Value Ref Range Status  02/09/2024 13.7 13.0 - 17.0 g/dL Final  91/47/8295 62.1 13.0 - 17.7 g/dL Final         Passed - HCT in normal range and within 360 days    HCT  Date Value Ref Range Status  02/09/2024 41.4 39.0 - 52.0 % Final   Hematocrit  Date Value Ref Range Status  10/26/2021 41.7 37.5 - 51.0 % Final         Passed - PLT in normal range and within 360 days    Platelets  Date Value Ref Range Status  02/09/2024 180 150 - 400 K/uL Final  10/26/2021 223 150 - 450 x10E3/uL Final         Passed - WBC in normal range and within 360 days    WBC  Date Value Ref Range Status  02/09/2024 5.7 4.0 - 10.5 K/uL Final         Passed - Phenytoin  (serum) in normal range and within 360 days    Phenytoin , Total  Date Value Ref Range Status  12/29/2023 20.8 (H) 10.0 - 20.0 ug/mL Final    Comment:    (NOTE)                                Detection Limit =  0.8                          <0.8 Indicates None Detected Patient drug level exceeds published reference range.  Evaluate clinically for signs of potential toxicity.    Phenytoin  Lvl  Date Value Ref Range Status  01/09/2024 10.2 10.0 - 20.0 ug/mL Final    Comment:    Performed at [facility], [address], [place], [place] [address]   Phenytoin , Free  Date Value Ref Range Status  12/29/2023 1.4 1.0 - 2.0 ug/mL Final    Comment:    (NOTE)                                Detection Limit = 0.5 Performed At: [facility] [address] [place], [place] [id number] Pearlean Botts MD [id number]          Passed - Completed PHQ-2 or PHQ-9 in the last 360 days      Passed - Patient is not pregnant      Passed - Valid encounter within last  12 months    Recent Outpatient Visits           1 month ago Influenza A   [place] Comm Health Al[place] - A Dept Of [place]. [facility] [place], [place], [place]   5 months ago Seizure disorder [facility])   [place] Comm Health Vivien Grout - A Dept Of [place]. [facility] Collins Dean, NP   6 months ago Seizure [facility])   [place] Comm Health Vivien Grout - A Dept Of [place]. [facility] Lawrance Presume, MD   9 months ago Seizure [facility])   [place] Comm Health Kennard - A Dept Of [place]. [facility] [place], [place], [place]   1 year ago Leg cramps   [place] Comm Health Dillsburg - A Dept Of [place]. [facility] Collins Dean, NP       Future Appointments             In 2 days Lawrance Presume, MD [facility] Health Comm Health Ga[place] Bridge - A Dept Of Tommas Fragmin. [facility]             PHENYTOIN  INFATABS 50 MG tablet      Sig: Chew 0.5 tablets (25 mg total) by mouth at bedtime.     Not Delegated - Neurology:  Anticonvulsants - phenytoin  Failed - 02/19/2024 12:03 PM      Failed - This refill cannot be delegated      Passed - ALT in normal range and within 360 days    ALT  Date Value Ref Range Status  02/09/2024 19 0 - 44 U/L Final         Passed - AST in normal range and within 360 days    AST  Date Value Ref Range Status  02/09/2024 18 15 - 41 U/L Final         Passed - HGB in normal range and within 360 days    Hemoglobin  Date Value Ref Range Status  02/09/2024 13.7 13.0 - 17.0 g/dL Final  91/47/8295 62.1 13.0 - 17.7 g/dL Final         Passed - HCT in normal range and within 360 days    HCT  Date Value Ref Range Status  02/09/2024 41.4 39.0 - 52.0 % Final   Hematocrit  Date Value Ref Range Status  10/26/2021 41.7 37.5 - 51.0 % Final         Passed - PLT in normal range and within 360 days    Platelets  Date Value Ref Range Status  02/09/2024 180 150 - 400 K/uL Final  10/26/2021 223 150 - 450  x10E3/uL Final         Passed - WBC in normal range and within 360 days    WBC  Date Value Ref Range Status  02/09/2024 5.7 4.0 - 10.5 K/uL Final         Passed - Phenytoin  (serum) in normal range and within 360 days    Phenytoin , Total  Date Value Ref Range Status  12/29/2023 20.8 (H) 10.0 - 20.0 ug/mL Final    Comment:    (NOTE)                                Detection Limit =  0.8                          <0.8 Indicates None Detected Patient drug level exceeds published reference range.  Evaluate clinically for signs of potential toxicity.    Phenytoin  Lvl  Date Value Ref Range Status  01/09/2024 10.2 10.0 - 20.0 ug/mL Final    Comment:    Performed at [facility], [address] [place], [place] [address]   Phenytoin , Free  Date Value Ref Range Status  12/29/2023 1.4 1.0 - 2.0 ug/mL Final    Comment:    (NOTE)                                Detection Limit = 0.5 Performed At: [facility] [address] [place], [place] [id number] Pearlean Botts MD [id number]  Passed - Completed PHQ-2 or PHQ-9 in the last 360 days      Passed - Patient is not pregnant      Passed - Valid encounter within last 12 months    Recent Outpatient Visits           1 month ago Influenza A   [place] Comm Health Wellnss - A Dept Of [place]. [facility] [place], [place], [place]   5 months ago Seizure disorder [facility])   [place] Comm Health Vivien Grout - A Dept Of [place]. [facility] Collins Dean, NP   6 months ago Seizure [facility])   [place] Comm Health Vivien Grout - A Dept Of [place]. [facility] Lawrance Presume, MD   9 months ago Seizure [facility])   [place] Comm Health [place] Station - A Dept Of [place]. [facility] [place], [place], [place]   1 year ago Leg cramps   [place] Comm Health Tower - A Dept Of [place]. [facility] Collins Dean, NP       Future Appointments              In 2 days Lawrance Presume, MD [facility] Health Comm Health Laurie - A Dept Of Tommas Fragmin. [facility]            Signed Prescriptions Disp Refills   memantine  (NAMENDA ) 10 MG tablet 180 tablet 0    Sig: Take 1 tablet (10 mg total) by mouth 2 (two) times daily.     Neurology:  Alzheimer's Agents 2 Failed - 02/19/2024 12:03 PM      Failed - Valid encounter within last 6 months    Recent Outpatient Visits           1 month ago Influenza A   [place] Comm Health Wellnss - A Dept Of [place]. [facility] [place], [place], [place]   5 months ago Seizure disorder [facility])   [place] Comm Health Vivien Grout - A Dept Of [place]. [facility] Collins Dean, NP   6 months ago Seizure [facility])   [place] Comm Health Vivien Grout - A Dept Of [place]. [facility] Lawrance Presume, MD   9 months ago Seizure [facility])    Comm Health V[place] - A Dept Of [place]. [facility] [place], [place], [place]   1 year ago Leg cramps    Comm Health Forty Fort - A Dept Of [place]. [facility] Collins Dean, NP       Future Appointments             In 2 days Lawrance Presume, MD [facility] Health Comm Health Delta - A Dept Of Tommas Fragmin. [facility]            Passed - Cr in normal range and within 360 days    Creat  Date Value Ref Range Status  04/08/2017 0.93 0.70 - 1.25 mg/dL Final    Comment:      For patients > or = 71 years of age: The upper reference limit for Creatinine is approximately 13% higher for people identified as African-American.      Creatinine, Ser  Date Value Ref Range Status  02/09/2024 0.90 0.61 - 1.24 mg/dL Final         Passed - eGFR is 5 or above and within 360 days  GFR, Est African American  Date Value Ref Range Status  04/08/2017 >89 >=60 mL/min Final   GFR calc Af Amer  Date Value Ref Range Status  07/25/2020 >60 >60 mL/min Final   GFR, Est Non African American  Date Value  Ref Range Status  04/08/2017 86 >=60 mL/min Final   GFR, Estimated  Date Value Ref Range Status  02/09/2024 >60 >60 mL/min Final    Comment:    (NOTE) Calculated using the CKD-EPI Creatinine Equation (2021)    GFR  Date Value Ref Range Status  06/15/2020 94.35 >60.00 mL/min Final   eGFR  Date Value Ref Range Status  08/20/2023 75 >59 mL/min/1.73 Final          pantoprazole  (PROTONIX ) 40 MG tablet 90 tablet 0    Sig: Take 1 tablet (40 mg total) by mouth daily.     Gastroenterology: Proton Pump Inhibitors Passed - 02/19/2024 12:03 PM      Passed - Valid encounter within last 12 months    Recent Outpatient Visits           1 month ago Influenza A   [place] Comm Health [place] - A Dept Of [place]. [facility] [place], [place], [place]   5 months ago Seizure disorder [facility])   [place] Comm Health Vivien Grout - A Dept Of [place]. [facility] Collins Dean, NP   6 months ago Seizure [facility])   [place] Comm Health Vivien Grout - A Dept Of [place]. [facility] Lawrance Presume, MD   9 months ago Seizure [facility])   [place] Comm Health [place] Lake - A Dept Of [place]. [facility] [place], [place], [place]   1 year ago Leg cramps   [place] Comm Health Wa[place] - A Dept Of [place]. [facility] Collins Dean, NP       Future Appointments             In 2 days Lawrance Presume, MD [facility] Health Comm Health Woodall - A Dept Of Tommas Fragmin. [facility]            Refused Prescriptions Disp Refills   levETIRAcetam  (KEPPRA ) 750 MG tablet 360 tablet     Sig: Take 2 tablets (1,500 mg total) by mouth 2 (two) times daily.     Neurology:  Anticonvulsants - levetiracetam  Passed - 02/19/2024 12:03 PM      Passed - Cr in normal range and within 360 days    Creat  Date Value Ref Range Status  04/08/2017 0.93 0.70 - 1.25 mg/dL Final    Comment:      For patients > or = 71 years of age: The upper reference limit  for Creatinine is approximately 13% higher for people identified as African-American.      Creatinine, Ser  Date Value Ref Range Status  02/09/2024 0.90 0.61 - 1.24 mg/dL Final         Passed - Completed PHQ-2 or PHQ-9 in the last 360 days      Passed - Valid encounter within last 12 months    Recent Outpatient Visits           1 month ago Influenza A   [place] Comm Health Wellnss - A Dept Of [place]. [facility] [place], [place], [place]   5 months ago Seizure disorder [facility])    Comm Health Vivien Grout - A Dept Of [place].  [facility] Collins Dean, NP   6 months ago Seizure [facility])   [place] Comm Health Vivien Grout - A Dept Of [place]. [facility] Lawrance Presume, MD   9 months ago Seizure [facility])   [place] Comm Health Man[place] - A Dept Of [place]. [facility] [place], [place], [place]   1 year ago Leg cramps   [place] Comm Health North [place] - A Dept Of [place]. [facility] Collins Dean, NP       Future Appointments             In 2 days Lawrance Presume, MD [facility] Health Comm Health [place] - A Dept Of Tommas Fragmin. [facility]             rosuvastatin  (CRESTOR ) 10 MG tablet 90 tablet     Sig: Take 1 tablet (10 mg total) by mouth daily. STOP Simvastatin      Cardiovascular:  Antilipid - Statins 2 Failed - 02/19/2024 12:03 PM      Failed - Lipid Panel in normal range within the last 12 months    Cholesterol, Total  Date Value Ref Range Status  08/20/2023 122 100 - 199 mg/dL Final   LDL Chol Calc (NIH)  Date Value Ref Range Status  08/20/2023 58 0 - 99 mg/dL Final   HDL  Date Value Ref Range Status  08/20/2023 32 (L) >39 mg/dL Final   Triglycerides  Date Value Ref Range Status  08/20/2023 196 (H) 0 - 149 mg/dL Final         Passed - Cr in normal range and within 360 days    Creat  Date Value Ref Range Status  04/08/2017 0.93 0.70 - 1.25 mg/dL Final    Comment:      For  patients > or = 71 years of age: The upper reference limit for Creatinine is approximately 13% higher for people identified as African-American.      Creatinine, Ser  Date Value Ref Range Status  02/09/2024 0.90 0.61 - 1.24 mg/dL Final         Passed - Patient is not pregnant      Passed - Valid encounter within last 12 months    Recent Outpatient Visits           1 month ago Influenza A   [place] Comm Health Wellnss - A Dept Of [place]. [facility] [place], [place], [place]   5 months ago Seizure disorder [facility])   [place] Comm Health Vivien Grout - A Dept Of [place]. [facility] Collins Dean, NP   6 months ago Seizure [facility])   [place] Comm Health Vivien Grout - A Dept Of [place]. [facility] Lawrance Presume, MD   9 months ago Seizure [facility])   [place] Comm Health [place] - A Dept Of [place]. [facility] [place], [place], [place]   1 year ago Leg cramps   [place] Comm Health St. [facility] - A Dept Of [place]. [facility] Collins Dean, NP       Future Appointments             In 2 days Lawrance Presume, MD [facility] Health Comm Health [place]ork - A Dept Of Tommas Fragmin. [facility]

## 2024-02-19 NOTE — Telephone Encounter (Signed)
 Requested Prescriptions  Pending Prescriptions Disp Refills   amoxicillin -clavulanate (AUGMENTIN ) 875-125 MG tablet 10 tablet     Sig: Take 1 tablet by mouth 2 (two) times daily.     Off-Protocol Failed - 02/19/2024 12:02 PM      Failed - Medication not assigned to a protocol, review manually.      Passed - Valid encounter within last 12 months    Recent Outpatient Visits           1 month ago Influenza A   [place] Comm Health Wellnss - A Dept Of [place]. [facility] [place], [place], [place]   5 months ago Seizure disorder [facility])   [place] Comm Health Vivien Grout - A Dept Of Seminole. [facility] Collins Dean, NP   6 months ago Seizure [facility])   [place] Comm Health Vivien Grout - A Dept Of [place] Creek. [facility] Lawrance Presume, MD   9 months ago Seizure [facility])   [place] Comm Health Pleasant City - A Dept Of Cross Plains. [facility] [place], [place], [place]   1 year ago Leg cramps   [place] Comm Health Oak Creek - A Dept Of [place]. [facility] Collins Dean, NP       Future Appointments             In 2 days Lawrance Presume, MD [facility] Health Comm Health Booth - A Dept Of Tommas Fragmin. [facility]             benzonatate  (TESSALON ) 100 MG capsule 21 capsule     Sig: Take 1 capsule (100 mg total) by mouth every 8 (eight) hours.     Ear, Nose, and Throat:  Antitussives/Expectorants Passed - 02/19/2024 12:02 PM      Passed - Valid encounter within last 12 months    Recent Outpatient Visits           1 month ago Influenza A   [place] Comm Health Haddon Heights - A Dept Of Wy[place]. [facility] [place], [place], [place]   5 months ago Seizure disorder [facility])   [place] Comm Health Vivien Grout - A Dept Of [place]. [facility] Collins Dean, NP   6 months ago Seizure [facility])   [place] Comm Health Vivien Grout - A Dept Of [place]. [facility] Lawrance Presume, MD   9 months ago Seizure  [facility])   [place] Comm Health Olivet - A Dept Of Blackshear. [facility] [place], [place], [place]   1 year ago Leg cramps    Comm Health Metamora - A Dept Of [place]. [facility] Collins Dean, NP       Future Appointments             In 2 days Lawrance Presume, MD [facility] Health Comm Health Spencer - A Dept Of Tommas Fragmin. [facility]             guaiFENesin  (MUCINEX ) 600 MG 12 hr tablet 30 tablet     Sig: Take 1 tablet (600 mg total) by mouth 2 (two) times daily.     Over the Counter:  OTC Passed - 02/19/2024 12:02 PM      Passed - Valid encounter within last 12 months    Recent Outpatient Visits           1 month ago Influenza  A   [place] Comm Health G. L. Garci­a - A Dept Of B[place]. [facility] [place], [place], [place]   5 months ago Seizure disorder [facility])   [place] Comm Health Vivien Grout - A Dept Of Flem[place]. [facility] Collins Dean, NP   6 months ago Seizure [facility])   [place] Comm Health Vivien Grout - A Dept Of [place]. [facility] Lawrance Presume, MD   9 months ago Seizure [facility])   [place] Comm Health Cadwell - A Dept Of [place]. [facility] [place], [place], [place]   1 year ago Leg cramps   [place] Comm Health Ashton - A Dept Of Vineyard [place]. [facility] Collins Dean, NP       Future Appointments             In 2 days Lawrance Presume, MD [facility] Health Comm Health Bourg - A Dept Of Tommas Fragmin. [facility]             Lacosamide  150 MG TABS 60 tablet     Sig: Take 1 tablet (150 mg total) by mouth 2 (two) times daily.     Not Delegated - Neurology:  Anticonvulsants - Controlled - lacosamide  Failed - 02/19/2024 12:02 PM      Failed - This refill cannot be delegated      Passed - Cr in normal range and within 360 days    Creat  Date Value Ref Range Status  04/08/2017 0.93 0.70 - 1.25 mg/dL Final    Comment:      For patients > or = 71 years  of age: The upper reference limit for Creatinine is approximately 13% higher for people identified as African-American.      Creatinine, Ser  Date Value Ref Range Status  02/09/2024 0.90 0.61 - 1.24 mg/dL Final         Passed - AST in normal range and within 360 days    AST  Date Value Ref Range Status  02/09/2024 18 15 - 41 U/L Final         Passed - ALT in normal range and within 360 days    ALT  Date Value Ref Range Status  02/09/2024 19 0 - 44 U/L Final         Passed - Completed PHQ-2 or PHQ-9 in the last 360 days      Passed - Valid encounter within last 12 months    Recent Outpatient Visits           1 month ago Influenza A   [place] Comm Health Pottery Addition - A Dept Of [place]. [facility] [place], [place], [place]   5 months ago Seizure disorder [facility])   [place] Comm Health Vivien Grout - A Dept Of Ro[place]. [facility] Collins Dean, NP   6 months ago Seizure [facility])   [place] Comm Health Vivien Grout - A Dept Of [place]. [facility] Lawrance Presume, MD   9 months ago Seizure [facility])   [place] Comm Health Pump Back - A Dept Of [place] Beach. [facility] [place], [place], [place]   1 year ago Leg cramps    Comm Health [place] - A Dept Of [place]iley. [facility] Collins Dean, NP       Future Appointments             In  2 days Lawrance Presume, MD [facility] Health Comm Health Valencia - A Dept Of Tommas Fragmin. [facility]             ondansetron  (ZOFRAN ) 4 MG tablet 20 tablet     Sig: Take 1 tablet (4 mg total) by mouth every 6 (six) hours as needed for nausea.     Not Delegated - Gastroenterology: Antiemetics - ondansetron  Failed - 02/19/2024 12:02 PM      Failed - This refill cannot be delegated      Failed - Valid encounter within last 6 months    Recent Outpatient Visits           1 month ago Influenza A   [place] Comm Health Wellnss - A Dept Of Emm[place]. [facility]  [place], [place], [place]   5 months ago Seizure disorder [facility])   [place] Comm Health Vivien Grout - A Dept Of [place]. [facility] Collins Dean, NP   6 months ago Seizure [facility])   [place] Comm Health Vivien Grout - A Dept Of [place]. [place]. [facility] Lawrance Presume, MD   9 months ago Seizure [facility])   [place] Comm Health Bruceville - A Dept Of Town Line. [facility] [place], [place], [place]   1 year ago Leg cramps   [place] Comm Health Clinton - A Dept Of Augusta Springs. [facility] Collins Dean, NP       Future Appointments             In 2 days Lawrance Presume, MD [facility] Health Comm Health Paoli - A Dept Of Tommas Fragmin. [facility]            Passed - AST in normal range and within 360 days    AST  Date Value Ref Range Status  02/09/2024 18 15 - 41 U/L Final         Passed - ALT in normal range and within 360 days    ALT  Date Value Ref Range Status  02/09/2024 19 0 - 44 U/L Final          phenytoin  (DILANTIN ) 100 MG ER capsule 180 capsule     Sig: Take 2 capsules (200 mg total) by mouth at bedtime. FOR SEIZURES     Not Delegated - Neurology:  Anticonvulsants - phenytoin  Failed - 02/19/2024 12:02 PM      Failed - This refill cannot be delegated      Passed - ALT in normal range and within 360 days    ALT  Date Value Ref Range Status  02/09/2024 19 0 - 44 U/L Final         Passed - AST in normal range and within 360 days    AST  Date Value Ref Range Status  02/09/2024 18 15 - 41 U/L Final         Passed - HGB in normal range and within 360 days    Hemoglobin  Date Value Ref Range Status  02/09/2024 13.7 13.0 - 17.0 g/dL Final  16/07/9603 54.0 13.0 - 17.7 g/dL Final         Passed - HCT in normal range and within 360 days    HCT  Date Value Ref Range Status  02/09/2024 41.4 39.0 - 52.0 % Final   Hematocrit  Date Value Ref Range Status  10/26/2021 41.7 37.5 - 51.0 % Final  Passed - PLT in  normal range and within 360 days    Platelets  Date Value Ref Range Status  02/09/2024 180 150 - 400 K/uL Final  10/26/2021 223 150 - 450 x10E3/uL Final         Passed - WBC in normal range and within 360 days    WBC  Date Value Ref Range Status  02/09/2024 5.7 4.0 - 10.5 K/uL Final         Passed - Phenytoin  (serum) in normal range and within 360 days    Phenytoin , Total  Date Value Ref Range Status  12/29/2023 20.8 (H) 10.0 - 20.0 ug/mL Final    Comment:    (NOTE)                                Detection Limit =  0.8                          <0.8 Indicates None Detected Patient drug level exceeds published reference range.  Evaluate clinically for signs of potential toxicity.    Phenytoin  Lvl  Date Value Ref Range Status  01/09/2024 10.2 10.0 - 20.0 ug/mL Final    Comment:    Performed at [facility], [address], [place], [place] [address]   Phenytoin , Free  Date Value Ref Range Status  12/29/2023 1.4 1.0 - 2.0 ug/mL Final    Comment:    (NOTE)                                Detection Limit = 0.5 Performed At: [facility] [address] [employer] [place], [place] [id number] Pearlean Botts MD [id number]          Passed - Completed PHQ-2 or PHQ-9 in the last 360 days      Passed - Patient is not pregnant      Passed - Valid encounter within last 12 months    Recent Outpatient Visits           1 month ago Influenza A   [place] Comm Health Crenshaw - A Dept Of [place]ren[place]. [facility] [place], [place], [place]   5 months ago Seizure disorder [facility])   [place] Comm Health Vivien Grout - A Dept Of Ruso. [facility] Collins Dean, NP   6 months ago Seizure [facility])   [place] Comm Health Vivien Grout - A Dept Of [place]. [facility] Lawrance Presume, MD   9 months ago Seizure [facility])   [place] Comm Health Stockton - A Dept Of [place]en. [facility] [place], [place], [place]   1 year ago Leg cramps    [place] Comm Health Crete - A Dept Of Wil[place]. [facility] Collins Dean, NP       Future Appointments             In 2 days Lawrance Presume, MD [facility] Health Comm Health Wayne Heights - A Dept Of Tommas Fragmin. [facility]             PHENYTOIN  INFATABS 50 MG tablet      Sig: Chew 0.5 tablets (25 mg total) by mouth at bedtime.     Not Delegated - Neurology:  Anticonvulsants - phenytoin  Failed - 02/19/2024 12:02  PM      Failed - This refill cannot be delegated      Passed - ALT in normal range and within 360 days    ALT  Date Value Ref Range Status  02/09/2024 19 0 - 44 U/L Final         Passed - AST in normal range and within 360 days    AST  Date Value Ref Range Status  02/09/2024 18 15 - 41 U/L Final         Passed - HGB in normal range and within 360 days    Hemoglobin  Date Value Ref Range Status  02/09/2024 13.7 13.0 - 17.0 g/dL Final  82/95/6213 08.6 13.0 - 17.7 g/dL Final         Passed - HCT in normal range and within 360 days    HCT  Date Value Ref Range Status  02/09/2024 41.4 39.0 - 52.0 % Final   Hematocrit  Date Value Ref Range Status  10/26/2021 41.7 37.5 - 51.0 % Final         Passed - PLT in normal range and within 360 days    Platelets  Date Value Ref Range Status  02/09/2024 180 150 - 400 K/uL Final  10/26/2021 223 150 - 450 x10E3/uL Final         Passed - WBC in normal range and within 360 days    WBC  Date Value Ref Range Status  02/09/2024 5.7 4.0 - 10.5 K/uL Final         Passed - Phenytoin  (serum) in normal range and within 360 days    Phenytoin , Total  Date Value Ref Range Status  12/29/2023 20.8 (H) 10.0 - 20.0 ug/mL Final    Comment:    (NOTE)                                Detection Limit =  0.8                          <0.8 Indicates None Detected Patient drug level exceeds published reference range.  Evaluate clinically for signs of potential toxicity.    Phenytoin  Lvl  Date Value Ref Range  Status  01/09/2024 10.2 10.0 - 20.0 ug/mL Final    Comment:    Performed at [facility], [address], [place], [place] [address]   Phenytoin , Free  Date Value Ref Range Status  12/29/2023 1.4 1.0 - 2.0 ug/mL Final    Comment:    (NOTE)                                Detection Limit = 0.5 Performed At: [facility] [address] [place], [place] [id number] Pearlean Botts MD [id number]          Passed - Completed PHQ-2 or PHQ-9 in the last 360 days      Passed - Patient is not pregnant      Passed - Valid encounter within last 12 months    Recent Outpatient Visits           1 month ago Influenza A   [place] Comm Health Schuyler - A Dept Of [place]. [facility] [place], [place], [place]   5 months ago Seizure disorder [facility])   [place] Comm  Health Wellnss - A Dept Of Harmony. [facility] Collins Dean, NP   6 months ago Seizure [facility])   [place] Comm Health Vivien Grout - A Dept Of Steen. [facility] Lawrance Presume, MD   9 months ago Seizure [facility])   [place] Comm Health Leando - A Dept Of [place]. [facility] [place], [place], [place]   1 year ago Leg cramps   [place] Comm Health Hidalgo - A Dept Of W[place]keen[place]. [facility] Collins Dean, NP       Future Appointments             In 2 days Lawrance Presume, MD [facility] Health Comm Health Ludell - A Dept Of Tommas Fragmin. [facility]            Signed Prescriptions Disp Refills   memantine  (NAMENDA ) 10 MG tablet 180 tablet 0    Sig: Take 1 tablet (10 mg total) by mouth 2 (two) times daily.     Neurology:  Alzheimer's Agents 2 Failed - 02/19/2024 12:02 PM      Failed - Valid encounter within last 6 months    Recent Outpatient Visits           1 month ago Influenza A   [place] Comm Health Wellnss - A Dept Of Dod[place]. [facility] [place], [place], [place]   5 months ago Seizure disorder [facility])    [place] Comm Health Vivien Grout - A Dept Of [place]. [facility] Collins Dean, NP   6 months ago Seizure [facility])   [place] Comm Health Vivien Grout - A Dept Of [place]. [facility] Lawrance Presume, MD   9 months ago Seizure [facility])    Comm Health Richmond - A Dept Of [place]. [facility] [place], [place], [place]   1 year ago Leg cramps    Comm Health Farm[place] - A Dept Of . [facility] Collins Dean, NP       Future Appointments             In 2 days Lawrance Presume, MD [facility] Health Comm Health Ripon - A Dept Of Tommas Fragmin. [facility]            Passed - Cr in normal range and within 360 days    Creat  Date Value Ref Range Status  04/08/2017 0.93 0.70 - 1.25 mg/dL Final    Comment:      For patients > or = 71 years of age: The upper reference limit for Creatinine is approximately 13% higher for people identified as African-American.      Creatinine, Ser  Date Value Ref Range Status  02/09/2024 0.90 0.61 - 1.24 mg/dL Final         Passed - eGFR is 5 or above and within 360 days    GFR, Est African American  Date Value Ref Range Status  04/08/2017 >89 >=60 mL/min Final   GFR calc Af Amer  Date Value Ref Range Status  07/25/2020 >60 >60 mL/min Final   GFR, Est Non African American  Date Value Ref Range Status  04/08/2017 86 >=60 mL/min Final   GFR, Estimated  Date Value Ref Range Status  02/09/2024 >60 >60 mL/min Final    Comment:    (NOTE) Calculated using the CKD-EPI Creatinine Equation (2021)    GFR  Date Value Ref Range Status  06/15/2020 94.35 >60.00 mL/min Final   eGFR  Date Value Ref Range Status  08/20/2023 75 >59 mL/min/1.73 Final          pantoprazole  (PROTONIX ) 40 MG tablet 90 tablet 0    Sig: Take 1 tablet (40 mg total) by mouth daily.     Gastroenterology: Proton Pump Inhibitors Passed - 02/19/2024 12:02 PM      Passed - Valid encounter within last 12  months    Recent Outpatient Visits           1 month ago Influenza A   [place] Comm Health Columbia - A Dept Of Riverview. [facility] [place], [place], [place]   5 months ago Seizure disorder [facility])   [place] Comm Health Vivien Grout - A Dept Of [place]. [facility] Collins Dean, NP   6 months ago Seizure [facility])   [place] Comm Health Vivien Grout - A Dept Of Bayfield. [facility] Lawrance Presume, MD   9 months ago Seizure [facility])   [place] Comm Health Kula - A Dept Of Cricket. [facility] [place], [place], [place]   1 year ago Leg cramps   [place] Comm Health Delphos - A Dept Of Kodi[place]. [facility] Collins Dean, NP       Future Appointments             In 2 days Lawrance Presume, MD [facility] Health Comm Health L[place] Verdes Comunidad - A Dept Of Tommas Fragmin. [facility]            Refused Prescriptions Disp Refills   levETIRAcetam  (KEPPRA ) 750 MG tablet 360 tablet     Sig: Take 2 tablets (1,500 mg total) by mouth 2 (two) times daily.     Neurology:  Anticonvulsants - levetiracetam  Passed - 02/19/2024 12:02 PM      Passed - Cr in normal range and within 360 days    Creat  Date Value Ref Range Status  04/08/2017 0.93 0.70 - 1.25 mg/dL Final    Comment:      For patients > or = 71 years of age: The upper reference limit for Creatinine is approximately 13% higher for people identified as African-American.      Creatinine, Ser  Date Value Ref Range Status  02/09/2024 0.90 0.61 - 1.24 mg/dL Final         Passed - Completed PHQ-2 or PHQ-9 in the last 360 days      Passed - Valid encounter within last 12 months    Recent Outpatient Visits           1 month ago Influenza A   [place] Comm Health Wellnss - A Dept Of [place]. [facility] [place], [place], [place]   5 months ago Seizure disorder [facility])   [place] Comm Health Vivien Grout - A Dept Of [place]. [facility] Collins Dean, NP    6 months ago Seizure [facility])   [place] Center Comm Health Vivien Grout - A Dept Of [place]. [facility] Lawrance Presume, MD   9 months ago Seizure [facility])   [place] Comm Health Rosedale - A Dept Of [place]. [facility] [place], [place], [place]   1 year ago Leg cramps   [place] Comm Health Delafield - A Dept Of [place]. [facility] Collins Dean, [place]  Future Appointments             In 2 days Lawrance Presume, MD [facility] Health Comm Health Pe[place] - A Dept Of Tommas Fragmin. [facility]             rosuvastatin  (CRESTOR ) 10 MG tablet 90 tablet     Sig: Take 1 tablet (10 mg total) by mouth daily. STOP Simvastatin      Cardiovascular:  Antilipid - Statins 2 Failed - 02/19/2024 12:02 PM      Failed - Lipid Panel in normal range within the last 12 months    Cholesterol, Total  Date Value Ref Range Status  08/20/2023 122 100 - 199 mg/dL Final   LDL Chol Calc (NIH)  Date Value Ref Range Status  08/20/2023 58 0 - 99 mg/dL Final   HDL  Date Value Ref Range Status  08/20/2023 32 (L) >39 mg/dL Final   Triglycerides  Date Value Ref Range Status  08/20/2023 196 (H) 0 - 149 mg/dL Final         Passed - Cr in normal range and within 360 days    Creat  Date Value Ref Range Status  04/08/2017 0.93 0.70 - 1.25 mg/dL Final    Comment:      For patients > or = 71 years of age: The upper reference limit for Creatinine is approximately 13% higher for people identified as African-American.      Creatinine, Ser  Date Value Ref Range Status  02/09/2024 0.90 0.61 - 1.24 mg/dL Final         Passed - Patient is not pregnant      Passed - Valid encounter within last 12 months    Recent Outpatient Visits           1 month ago Influenza A   [place] Comm Health Wellnss - A Dept Of [place]. [facility] [place], [place], [place]   5 months ago Seizure disorder [facility])   [place] Comm Health Vivien Grout - A Dept Of [place]. [facility] Collins Dean, NP   6 months ago Seizure [facility])   [place] Comm Health Vivien Grout - A Dept Of [place]. [facility] Lawrance Presume, MD   9 months ago Seizure [facility])   [place] Comm Health Venersborg - A Dept Of Robinson. [facility] [place], [place], [place]   1 year ago Leg cramps   [place] Comm Health Guerneville - A Dept Of . [facility] Collins Dean, NP       Future Appointments             In 2 days Lawrance Presume, MD [facility] Health Comm Health Cong[place] - A Dept Of Tommas Fragmin. [facility]

## 2024-02-19 NOTE — Telephone Encounter (Signed)
 Requested Prescriptions  Pending Prescriptions Disp Refills   amoxicillin -clavulanate (AUGMENTIN ) 875-125 MG tablet 10 tablet     Sig: Take 1 tablet by mouth 2 (two) times daily.     Off-Protocol Failed - 02/19/2024 11:57 AM      Failed - Medication not assigned to a protocol, review manually.      Passed - Valid encounter within last 12 months    Recent Outpatient Visits           1 month ago Influenza A   [place] Comm Health Wellnss - A Dept Of [place]. [facility] [place], [place], [place]   5 months ago Seizure disorder [facility])   [place] Comm Health Vivien Grout - A Dept Of [place]. [facility] Collins Dean, NP   6 months ago Seizure [facility])   [place] Comm Health Vivien Grout - A Dept Of [place]. [facility] Lawrance Presume, MD   9 months ago Seizure [facility])   [place] Comm Health Gos[place] - A Dept Of [place]. [facility] [place], [place], [place]   1 year ago Leg cramps   [place] Comm Health Broughton - A Dept Of [place]. [facility] Collins Dean, NP       Future Appointments             In 2 days Lawrance Presume, MD [facility] Health Comm Health [place] - A Dept Of Tommas Fragmin. [facility]             benzonatate  (TESSALON ) 100 MG capsule 21 capsule     Sig: Take 1 capsule (100 mg total) by mouth every 8 (eight) hours.     Ear, Nose, and Throat:  Antitussives/Expectorants Passed - 02/19/2024 11:57 AM      Passed - Valid encounter within last 12 months    Recent Outpatient Visits           1 month ago Influenza A   [place] Comm [facility] Fielding - A Dept Of [place]. [facility] [place], [place], [place]   5 months ago Seizure disorder [facility])   [place] Comm Health Vivien Grout - A Dept Of [place]. [facility] Collins Dean, NP   6 months ago Seizure [facility])   Delta Comm Health Vivien Grout - A Dept Of Humboldt. [facility] Lawrance Presume, MD   9 months ago Seizure  [facility])   [place] Comm Health Stewart Manor - A Dept Of [place]. [facility] [place], [place], [place]   1 year ago Leg cramps    Comm Health Rose Hill Acres - A Dept Of . [facility] Collins Dean, NP       Future Appointments             In 2 days Lawrance Presume, MD [facility] Health Comm Health Rosebud - A Dept Of Tommas Fragmin. [facility]             guaiFENesin  (MUCINEX ) 600 MG 12 hr tablet 30 tablet     Sig: Take 1 tablet (600 mg total) by mouth 2 (two) times daily.     Over the Counter:  OTC Passed - 02/19/2024 11:57 AM      Passed - Valid encounter within last 12 months    Recent Outpatient Visits           1 month ago Influenza  A   [place] Comm Health Poseyville - A Dept Of [place]. [facility] [place], [place], [place]   5 months ago Seizure disorder [facility])   [place] Comm Health Vivien Grout - A Dept Of [place]. [facility] Collins Dean, NP   6 months ago Seizure [facility])   [place] Comm Health Vivien Grout - A Dept Of [place]. [facility] Lawrance Presume, MD   9 months ago Seizure [facility])   [place] Park Comm Health Circle City - A Dept Of [place]. [facility] [place], [place], [place]   1 year ago Leg cramps   [place] Comm Health [place] - A Dept Of [place] Acres. [facility] Collins Dean, NP       Future Appointments             In 2 days Lawrance Presume, MD [facility] Health Comm Health [place] Gap - A Dept Of Tommas Fragmin. [facility]             Lacosamide  150 MG TABS 60 tablet     Sig: Take 1 tablet (150 mg total) by mouth 2 (two) times daily.     Not Delegated - Neurology:  Anticonvulsants - Controlled - lacosamide  Failed - 02/19/2024 11:57 AM      Failed - This refill cannot be delegated      Passed - Cr in normal range and within 360 days    Creat  Date Value Ref Range Status  04/08/2017 0.93 0.70 - 1.25 mg/dL Final    Comment:      For patients > or = 71 years  of age: The upper reference limit for Creatinine is approximately 13% higher for people identified as African-American.      Creatinine, Ser  Date Value Ref Range Status  02/09/2024 0.90 0.61 - 1.24 mg/dL Final         Passed - AST in normal range and within 360 days    AST  Date Value Ref Range Status  02/09/2024 18 15 - 41 U/L Final         Passed - ALT in normal range and within 360 days    ALT  Date Value Ref Range Status  02/09/2024 19 0 - 44 U/L Final         Passed - Completed PHQ-2 or PHQ-9 in the last 360 days      Passed - Valid encounter within last 12 months    Recent Outpatient Visits           1 month ago Influenza A   [place] Comm Health Orchard - A Dept Of [place]. [facility] [place], [place], [place]   5 months ago Seizure disorder [facility])   [place] Comm Health Vivien Grout - A Dept Of [place]. [facility] Collins Dean, NP   6 months ago Seizure [facility])   [place] Comm Health Vivien Grout - A Dept Of [place]. [facility] Lawrance Presume, MD   9 months ago Seizure [facility])   [place] Comm Health Palm Valley - A Dept Of [place]. [facility] [place], [place], [place]   1 year ago Leg cramps   [place] Comm Health Pine Ridge - A Dept Of . [facility] Collins Dean, NP       Future Appointments             In  2 days Lawrance Presume, MD [facility] Health Comm Health Elkhart - A Dept Of Tommas Fragmin. [facility]             levETIRAcetam  (KEPPRA ) 750 MG tablet 360 tablet     Sig: Take 2 tablets (1,500 mg total) by mouth 2 (two) times daily.     Neurology:  Anticonvulsants - levetiracetam  Passed - 02/19/2024 11:57 AM      Passed - Cr in normal range and within 360 days    Creat  Date Value Ref Range Status  04/08/2017 0.93 0.70 - 1.25 mg/dL Final    Comment:      For patients > or = 71 years of age: The upper reference limit for Creatinine is approximately 13% higher for people  identified as African-American.      Creatinine, Ser  Date Value Ref Range Status  02/09/2024 0.90 0.61 - 1.24 mg/dL Final         Passed - Completed PHQ-2 or PHQ-9 in the last 360 days      Passed - Valid encounter within last 12 months    Recent Outpatient Visits           1 month ago Influenza A   [place] Comm [facility] Wellnss - A Dept Of [place]. [facility] [place], [place], [place]   5 months ago Seizure disorder [facility])   [place] Comm Health Vivien Grout - A Dept Of [place]. [facility] Collins Dean, NP   6 months ago Seizure [facility])   [place] Comm Health Vivien Grout - A Dept Of [place]. [facility] Lawrance Presume, MD   9 months ago Seizure [facility])   [place]ores Comm Health Texarkana - A Dept Of [place]. [facility] [place], [place], [place]   1 year ago Leg cramps   [place] Comm Health Plano - A Dept Of [place]. [facility] Collins Dean, NP       Future Appointments             In 2 days Lawrance Presume, MD [facility] Health Comm Health [place] - A Dept Of Tommas Fragmin. [facility]             memantine  (NAMENDA ) 10 MG tablet 180 tablet 0    Sig: Take 1 tablet (10 mg total) by mouth 2 (two) times daily.     Neurology:  Alzheimer's Agents 2 Failed - 02/19/2024 11:57 AM      Failed - Valid encounter within last 6 months    Recent Outpatient Visits           1 month ago Influenza A   [place] Comm Health Wellnss - A Dept Of [place]. [facility] [place], [place], [place]   5 months ago Seizure disorder [facility])   [place] Comm Health Vivien Grout - A Dept Of [place]. [facility] Collins Dean, NP   6 months ago Seizure [facility])   [place] Comm Health Vivien Grout - A Dept Of Reh[place]. [facility] Lawrance Presume, MD   9 months ago Seizure [facility])   [place] Comm Health Bayou Country Club - A Dept Of [place]. [facility] [place], [place], [place]   1 year ago Leg  cramps   [place] Comm Health Reedy - A Dept Of [place]. [facility] Collins Dean, [place]  Future Appointments             In 2 days Lawrance Presume, MD [facility] Health Comm Health [place] Station - A Dept Of Tommas Fragmin. [facility]            Passed - Cr in normal range and within 360 days    Creat  Date Value Ref Range Status  04/08/2017 0.93 0.70 - 1.25 mg/dL Final    Comment:      For patients > or = 71 years of age: The upper reference limit for Creatinine is approximately 13% higher for people identified as African-American.      Creatinine, Ser  Date Value Ref Range Status  02/09/2024 0.90 0.61 - 1.24 mg/dL Final         Passed - eGFR is 5 or above and within 360 days    GFR, Est African American  Date Value Ref Range Status  04/08/2017 >89 >=60 mL/min Final   GFR calc Af Amer  Date Value Ref Range Status  07/25/2020 >60 >60 mL/min Final   GFR, Est Non African American  Date Value Ref Range Status  04/08/2017 86 >=60 mL/min Final   GFR, Estimated  Date Value Ref Range Status  02/09/2024 >60 >60 mL/min Final    Comment:    (NOTE) Calculated using the CKD-EPI Creatinine Equation (2021)    GFR  Date Value Ref Range Status  06/15/2020 94.35 >60.00 mL/min Final   eGFR  Date Value Ref Range Status  08/20/2023 75 >59 mL/min/1.73 Final          ondansetron  (ZOFRAN ) 4 MG tablet 20 tablet     Sig: Take 1 tablet (4 mg total) by mouth every 6 (six) hours as needed for nausea.     Not Delegated - Gastroenterology: Antiemetics - ondansetron  Failed - 02/19/2024 11:57 AM      Failed - This refill cannot be delegated      Failed - Valid encounter within last 6 months    Recent Outpatient Visits           1 month ago Influenza A   [place] Comm Health Wellnss - A Dept Of [place]. [facility] [address], [place], [place]   5 months ago Seizure disorder [facility])   [place] Comm Health Vivien Grout - A Dept Of [place]. [facility] Collins Dean, NP   6 months ago Seizure [facility])   [place] Comm Health Vivien Grout - A Dept Of [place]. [facility] Lawrance Presume, MD   9 months ago Seizure [facility])   [place] Comm Health Urbana - A Dept Of Worth. [facility] [place], [place], [place]   1 year ago Leg cramps   [place] Comm Health Garvin - A Dept Of [place]. [facility] Collins Dean, NP       Future Appointments             In 2 days Lawrance Presume, MD [facility] Health Comm Health Par[place] de Navarro - A Dept Of Tommas Fragmin. [facility]            Passed - AST in normal range and within 360 days    AST  Date Value Ref Range Status  02/09/2024 18 15 - 41 U/L Final         Passed - ALT in normal range and within 360 days    ALT  Date Value Ref  Range Status  02/09/2024 19 0 - 44 U/L Final          pantoprazole  (PROTONIX ) 40 MG tablet 90 tablet 0    Sig: Take 1 tablet (40 mg total) by mouth daily.     Gastroenterology: Proton Pump Inhibitors Passed - 02/19/2024 11:57 AM      Passed - Valid encounter within last 12 months    Recent Outpatient Visits           1 month ago Influenza A   [place] Comm Health Spiritwood Lake - A Dept Of [place]. [facility] [place], [place], [place]   5 months ago Seizure disorder [facility])   [place] Comm Health Vivien Grout - A Dept Of [place]. [facility] Collins Dean, NP   6 months ago Seizure [facility])   [place] Comm Health Vivien Grout - A Dept Of [place]. [facility] Lawrance Presume, MD   9 months ago Seizure [facility])   [place] Comm Health Fairfield - A Dept Of [place]. [facility] [place], [place], [place]   1 year ago Leg cramps   [place] Comm Health Rancho Palos Verdes - A Dept Of Pe[place]. [facility] Collins Dean, NP       Future Appointments             In 2 days Lawrance Presume, MD [facility] Health Comm Health [place] - A Dept Of Tommas Fragmin. [facility]              phenytoin  (DILANTIN ) 100 MG ER capsule 180 capsule     Sig: Take 2 capsules (200 mg total) by mouth at bedtime. FOR SEIZURES     Not Delegated - Neurology:  Anticonvulsants - phenytoin  Failed - 02/19/2024 11:57 AM      Failed - This refill cannot be delegated      Passed - ALT in normal range and within 360 days    ALT  Date Value Ref Range Status  02/09/2024 19 0 - 44 U/L Final         Passed - AST in normal range and within 360 days    AST  Date Value Ref Range Status  02/09/2024 18 15 - 41 U/L Final         Passed - HGB in normal range and within 360 days    Hemoglobin  Date Value Ref Range Status  02/09/2024 13.7 13.0 - 17.0 g/dL Final  57/84/6962 95.2 13.0 - 17.7 g/dL Final         Passed - HCT in normal range and within 360 days    HCT  Date Value Ref Range Status  02/09/2024 41.4 39.0 - 52.0 % Final   Hematocrit  Date Value Ref Range Status  10/26/2021 41.7 37.5 - 51.0 % Final         Passed - PLT in normal range and within 360 days    Platelets  Date Value Ref Range Status  02/09/2024 180 150 - 400 K/uL Final  10/26/2021 223 150 - 450 x10E3/uL Final         Passed - WBC in normal range and within 360 days    WBC  Date Value Ref Range Status  02/09/2024 5.7 4.0 - 10.5 K/uL Final         Passed - Phenytoin  (serum) in normal range and within 360 days    Phenytoin , Total  Date Value Ref Range Status  12/29/2023 20.8 (H) 10.0 - 20.0 ug/mL Final    Comment:    (NOTE)                                Detection Limit =  0.8                          <0.8 Indicates None Detected Patient drug level exceeds published reference range.  Evaluate clinically for signs of potential toxicity.    Phenytoin  Lvl  Date Value Ref Range Status  01/09/2024 10.2 10.0 - 20.0 ug/mL Final    Comment:    Performed at [facility], [address] [place], [place] [address]   Phenytoin , Free  Date Value Ref Range Status  12/29/2023 1.4 1.0 - 2.0  ug/mL Final    Comment:    (NOTE)                                Detection Limit = 0.5 Performed At: [facility] [address] [place], [place] [id number] Pearlean Botts MD QI:[id number]          Passed - Completed PHQ-2 or PHQ-9 in the last 360 days      Passed - Patient is not pregnant      Passed - Valid encounter within last 12 months    Recent Outpatient Visits           1 month ago Influenza A   [place] Comm Health Little Hocking - A Dept Of [place]. [facility] [place], [place], [place]   5 months ago Seizure disorder [facility])   [place] Comm Health Vivien Grout - A Dept Of [place]. [facility] Collins Dean, NP   6 months ago Seizure [facility])   [place] Comm Health Vivien Grout - A Dept Of [place]. [facility] Lawrance Presume, MD   9 months ago Seizure [facility])   [place] Comm Health [address] [place] [address] - A Dept Of [place]uc[place]. [facility] [place], [place], [place]   1 year ago Leg cramps   [place] Comm Health Trafalgar - A Dept Of [place]. [facility] Collins Dean, NP       Future Appointments             In 2 days Lawrance Presume, MD [facility] Health Comm Health Orange Cove - A Dept Of Tommas Fragmin. [facility]             PHENYTOIN  INFATABS 50 MG tablet      Sig: Chew 0.5 tablets (25 mg total) by mouth at bedtime.     Not Delegated - Neurology:  Anticonvulsants - phenytoin  Failed - 02/19/2024 11:57 AM      Failed - This refill cannot be delegated      Passed - ALT in normal range and within 360 days    ALT  Date Value Ref Range Status  02/09/2024 19 0 - 44 U/L Final         Passed - AST in normal range and within 360 days    AST  Date Value Ref Range Status  02/09/2024 18 15 - 41 U/L Final         Passed - HGB in normal range and within 360 days    Hemoglobin  Date Value Ref Range Status  02/09/2024 13.7 13.0 - 17.0 g/dL Final  16/07/9603 54.0 13.0 - 17.7 g/dL Final         Passed - HCT in normal  range and within 360 days    HCT  Date Value Ref Range Status  02/09/2024 41.4 39.0 - 52.0 % Final   Hematocrit  Date Value Ref Range Status  10/26/2021 41.7 37.5 - 51.0 % Final         Passed - PLT in normal range and within 360 days    Platelets  Date Value Ref Range Status  02/09/2024 180 150 - 400 K/uL Final  10/26/2021 223 150 - 450 x10E3/uL Final         Passed - WBC in normal range and within 360 days    WBC  Date Value Ref Range Status  02/09/2024 5.7 4.0 - 10.5 K/uL Final         Passed - Phenytoin  (serum) in normal range and within 360 days    Phenytoin , Total  Date Value Ref Range Status  12/29/2023 20.8 (H) 10.0 - 20.0 ug/mL Final    Comment:    (NOTE)                                Detection Limit =  0.8                          <0.8 Indicates None Detected Patient drug level exceeds published reference range.  Evaluate clinically for signs of potential toxicity.    Phenytoin  Lvl  Date Value Ref Range Status  01/09/2024 10.2 10.0 - 20.0 ug/mL Final    Comment:    Performed at [facility], [address] [place], [place] [address]   Phenytoin , Free  Date Value Ref Range Status  12/29/2023 1.4 1.0 - 2.0 ug/mL Final    Comment:    (NOTE)                                Detection Limit = 0.5 Performed At: [facility] [address] [place], [place] [id number] Pearlean Botts MD ZH:[id number]          Passed - Completed PHQ-2 or PHQ-9 in the last 360 days      Passed - Patient is not pregnant      Passed - Valid encounter within last 12 months    Recent Outpatient Visits           1 month ago Influenza A   [place] Comm Health [place] - A Dept Of [place]. [facility] [place], [place], [place]   5 months ago Seizure disorder [facility])   [place] Comm Health Vivien Grout - A Dept Of [place]. [facility] Collins Dean, NP   6 months ago Seizure [facility])   [place] Comm Health Vivien Grout - A Dept Of  [place]. [facility] Lawrance Presume, MD   9 months ago Seizure [facility])   [place] Comm Health Rockv[place] - A Dept Of [place]. [facility] [place], [place], [place]   1 year ago Leg cramps   [place] Comm Health [place] - A Dept Of [place]. [facility] Collins Dean, NP       Future Appointments  In 2 days Lawrance Presume, MD [facility] Health Comm Health [place] - A Dept Of Tommas Fragmin. [facility]             rosuvastatin  (CRESTOR ) 10 MG tablet 90 tablet     Sig: Take 1 tablet (10 mg total) by mouth daily. STOP Simvastatin      Cardiovascular:  Antilipid - Statins 2 Failed - 02/19/2024 11:57 AM      Failed - Lipid Panel in normal range within the last 12 months    Cholesterol, Total  Date Value Ref Range Status  08/20/2023 122 100 - 199 mg/dL Final   LDL Chol Calc (NIH)  Date Value Ref Range Status  08/20/2023 58 0 - 99 mg/dL Final   HDL  Date Value Ref Range Status  08/20/2023 32 (L) >39 mg/dL Final   Triglycerides  Date Value Ref Range Status  08/20/2023 196 (H) 0 - 149 mg/dL Final         Passed - Cr in normal range and within 360 days    Creat  Date Value Ref Range Status  04/08/2017 0.93 0.70 - 1.25 mg/dL Final    Comment:      For patients > or = 71 years of age: The upper reference limit for Creatinine is approximately 13% higher for people identified as African-American.      Creatinine, Ser  Date Value Ref Range Status  02/09/2024 0.90 0.61 - 1.24 mg/dL Final         Passed - Patient is not pregnant      Passed - Valid encounter within last 12 months    Recent Outpatient Visits           1 month ago Influenza A   [place] Comm Health Wellnss - A Dept Of [place]. [facility] [place], [place], [place]   5 months ago Seizure disorder [facility])   [place] Comm Health Vivien Grout - A Dept Of [place]. [facility] Collins Dean, NP   6 months ago Seizure [facility])   [facility]  Health Comm Health Vivien Grout - A Dept Of [place] Road. [facility] Lawrance Presume, MD   9 months ago Seizure [facility])   [place] Comm Health Palmersville - A Dept Of [place]. [facility] [place], [place], [place]   1 year ago Leg cramps   [place] Comm Health Harbine - A Dept Of [place]. [facility] Collins Dean, NP       Future Appointments             In 2 days Lawrance Presume, MD [facility] Health Comm Health U[place] - A Dept Of Tommas Fragmin. [facility]

## 2024-02-21 ENCOUNTER — Telehealth: Payer: Self-pay | Admitting: Internal Medicine

## 2024-02-21 ENCOUNTER — Ambulatory Visit: Attending: Internal Medicine | Admitting: Internal Medicine

## 2024-02-21 VITALS — BP 117/78 | HR 73 | Temp 98.1°F | Ht 68.0 in | Wt 162.0 lb

## 2024-02-21 DIAGNOSIS — R160 Hepatomegaly, not elsewhere classified: Secondary | ICD-10-CM

## 2024-02-21 DIAGNOSIS — G40909 Epilepsy, unspecified, not intractable, without status epilepticus: Secondary | ICD-10-CM | POA: Diagnosis not present

## 2024-02-21 DIAGNOSIS — F0150 Vascular dementia without behavioral disturbance: Secondary | ICD-10-CM | POA: Diagnosis not present

## 2024-02-21 DIAGNOSIS — J302 Other seasonal allergic rhinitis: Secondary | ICD-10-CM

## 2024-02-21 MED ORDER — LORATADINE 10 MG PO TABS: 10.0000 mg | ORAL_TABLET | Freq: Every day | ORAL | 1 refills | Status: DC

## 2024-02-21 NOTE — Telephone Encounter (Signed)
 MRI of the abdomen was ordered back in March.  Patient's caregiver states that they have not received a call from radiology to schedule.  Can you please schedule and call his nephew who is his caregiver to let him know.  They request that the appointment be on a Tuesday or Thursday.

## 2024-02-21 NOTE — Progress Notes (Signed)
 Patient ID: [name], male    DOB: January 17, 1951  MRN: [id number]  CC: Chronic conditions (Chronic conditions. [name] seizure episode 2 days ago - pt walked out of home & stood in parking lot at 4 am/)   Subjective: [name] is a 73 y.o. male who presents for chronic ds management. His nephew and caregiver, [name], is with him. His concerns today include:  Pt with hx of hepatitis C treated, sz disorder, CVA with residual left-sided weakness uses a rolling walker, HTN, memory deficit (followed by neurology), vascular dementia with beh disturbance, tubular adenomas, PBC   Patient was hospitalized in March due to witnessed seizure episode and aspiration pneumonia.  His seizure medication lacosamide  dose was increased from 100 mg twice a day to 250 mg twice a day.  He continues to take Dilantin  and Keppra .  Nephew thinks he may have had a seizure 2 days ago because he was found outside standing in the parking lot of their apartment building 4 AM in the morning.  Patient did not appear to have any injuries.  I inquired whether patient sometimes wonders outside of the appointment given that he has dementia.  Nephew states that he has not done so in a while.  He continues to take Namenda .  Patient tells me he is eating and sleeping well.  Patient had CT of abdomen while in hospital in March.  This revealed incidental finding of liver mass.  MRI of the abdomen with and without contrast was recommended and was ordered by physician assistant.  However nephew states that they have not been called with an appointment.  Patient denies any abdominal pain, nausea or vomiting.  Reports good appetite.  History of CVA: He ambulates with a cane.  He is taking rosuvastatin  10 mg daily and aspirin .  His nephew controlled his medications and make sure that he takes them.  Nephew reports that patient has been having some sneezing, runny nose, cough and itchy throat.  Patient Active Problem List   Diagnosis  Date Noted   Community acquired pneumonia 01/10/2024   ARF (acute renal failure) (HCC) 01/10/2024   GERD (gastroesophageal reflux disease) 01/10/2024   Pneumonia 01/10/2024   Thrombocytopenia (HCC) 12/29/2023   Acute metabolic encephalopathy 12/29/2023   Liver mass 12/29/2023   Influenza A 12/29/2023   Seizure disorder (HCC) 12/29/2023   Dementia (HCC) 12/29/2023   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   Vascular dementia with other behavioral disturbance, unspecified dementia severity (HCC) 08/20/2023   Pain due to onychomycosis of toenails of both feet 02/28/2023   Sepsis (HCC) 02/23/2023   Barrett's esophagus 12/25/2021   Bell's palsy 08/23/2020   Elevated alkaline phosphatase level 11/17/2019   Adenomatous polyp of colon 09/02/2017   Functional urinary incontinence 09/02/2017   Hemiparesis and alteration of sensations as late effects of stroke (HCC) 01/25/2017   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/23/2015   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   Hip fracture (HCC) 11/26/2013   break through seizure 11/26/2013   Memory disorder 03/04/2013   Depressive disorder, not elsewhere classified 07/24/2012   Abnormality of gait 07/24/2012   Focal epilepsy with impairment of consciousness (HCC) 07/24/2012   Cerebral thrombosis with cerebral infarction (HCC) 07/24/2012   Lesion of ulnar nerve 07/24/2012     Current Outpatient Medications on File Prior to Visit  Medication Sig Dispense Refill   amoxicillin -clavulanate (AUGMENTIN ) 875-125 MG tablet Take 1 tablet by mouth 2 (two) times daily. 10 tablet 0   aspirin  EC 81  MG tablet Take 1 tablet (81 mg total) by mouth daily. 100 tablet 0   benzonatate  (TESSALON ) 100 MG capsule Take 1 capsule (100 mg total) by mouth 3 (three) times daily as needed for cough. 21 capsule O   guaiFENesin  (MUCINEX ) 600 MG 12 hr tablet Take 1 tablet (600 mg total) by mouth 2 (two) times daily. 30 tablet 0   lacosamide  150 MG TABS Take 1 tablet (150 mg total) by mouth 2  (two) times daily. 60 tablet 4   levETIRAcetam  (KEPPRA ) 750 MG tablet Take 2 tablets (1,500 mg total) by mouth 2 (two) times daily. (Patient taking differently: Take 1,500 mg by mouth See admin instructions. Take 1,500 mg by mouth in the morning and evening) 360 tablet 1   memantine  (NAMENDA ) 10 MG tablet Take 1 tablet (10 mg total) by mouth 2 (two) times daily. 180 tablet 0   ondansetron  (ZOFRAN ) 4 MG tablet Take 1 tablet (4 mg total) by mouth every 6 (six) hours as needed for nausea. 20 tablet 0   pantoprazole  (PROTONIX ) 40 MG tablet Take 1 tablet (40 mg total) by mouth daily. 90 tablet 0   phenytoin  (DILANTIN ) 100 MG ER capsule Take 2 capsules (200 mg total) by mouth at bedtime. FOR SEIZURES 180 capsule 2   PHENYTOIN  INFATABS 50 MG tablet Chew 0.5 tablets (25 mg total) by mouth at bedtime. 45 tablet 1   rosuvastatin  (CRESTOR ) 10 MG tablet Take 1 tablet (10 mg total) by mouth daily. STOP Simvastatin  90 tablet 3   No current facility-administered medications on file prior to visit.    No Known Allergies  Social History   Socioeconomic History   Marital status: Divorced    Spouse name: Not on file   Number of children: 0   Years of education: 10   Highest education level: Not on file  Occupational History   Not on file  Tobacco Use   Smoking status: Former    Current packs/day: 0.00    Types: Cigarettes    Quit date: 01/02/2014    Years since quitting: 10.1   Smokeless tobacco: Former  Building services engineer status: Never Used  Substance and Sexual Activity   Alcohol use: No    Alcohol/week: 0.0 standard drinks of alcohol    Comment: Former Alcoholic   Drug use: No   Sexual activity: Yes  Other Topics Concern   Not on file  Social History Narrative   ** Merged History Encounter **       Patient is single and lives with a friend and her son. Patient does not have any children. Patient is disabled. Patient has a 10 th grade education. Patient is left-handed. Patient drinks  some soda but not everyday.   Social Drivers of Corporate investment banker Strain: Low Risk  (06/25/2023)   Overall Financial Resource Strain (CARDIA)    Difficulty of Paying Living Expenses: Not hard at all  Food Insecurity: No Food Insecurity (01/14/2024)   Hunger Vital Sign    Worried About Running Out of Food in the Last Year: Never true    Ran Out of Food in the Last Year: Never true  Transportation Needs: No Transportation Needs (01/14/2024)   PRAPARE - Administrator, Civil Service (Medical): No    Lack of Transportation (Non-Medical): No  Physical Activity: Inactive (01/14/2024)   Exercise Vital Sign    Days of Exercise per Week: 0 days    Minutes of Exercise per Session:  0 min  Stress: No Stress Concern Present (06/25/2023)   [employer] of Occupational Health - Occupational Stress Questionnaire    Feeling of Stress : Not at all  Social Connections: Moderately Isolated (01/14/2024)   Social Connection and Isolation Panel [NHANES]    Frequency of Communication with Friends and Family: Once a week    Frequency of Social Gatherings with Friends and Family: Twice a week    Attends Religious Services: 1 to 4 times per year    Active Member of [employer] or Organizations: No    Attends Banker Meetings: Never    Marital Status: Divorced  Catering manager Violence: Not At Risk (01/14/2024)   Humiliation, Afraid, Rape, and Kick questionnaire    Fear of Current or Ex-Partner: No    Emotionally Abused: No    Physically Abused: No    Sexually Abused: No    Family History  Problem Relation Age of Onset   Pulmonary embolism Mother    Liver disease Father    Diabetes Sister    Hypertension Sister    Hypertension Sister    Hypertension Sister    Seizures Neg Hx     Past Surgical History:  Procedure Laterality Date   COLONOSCOPY WITH PROPOFOL  N/A 07/10/2017   Procedure: COLONOSCOPY WITH PROPOFOL ;  Surgeon: Felecia Hopper, MD;  Location: [facility];   Service: Gastroenterology;  Laterality: N/A;   ESOPHAGOGASTRODUODENOSCOPY (EGD) WITH PROPOFOL  N/A 07/10/2017   Procedure: ESOPHAGOGASTRODUODENOSCOPY (EGD) WITH PROPOFOL ;  Surgeon: Felecia Hopper, MD;  Location: [facility];  Service: Gastroenterology;  Laterality: N/A;   HIP ARTHROPLASTY Left 11/26/2013   Procedure: LEFT HIP HEMIARTHROPLASTY;  Surgeon: Arnie Lao, MD;  Location: [facility] OR;  Service: Orthopedics;  Laterality: Left;   WRIST SURGERY Left 95 & 96    ROS: Review of Systems Negative except as stated above  PHYSICAL EXAM: BP 117/78 (BP Location: Left Arm, Patient Position: Sitting, Cuff Size: Normal)   Pulse 73   Temp 98.1 F (36.7 C) (Oral)   Ht 5\' 8"  (1.727 m)   Wt 162 lb (73.5 kg)   SpO2 98%   BMI 24.63 kg/m   Physical Exam   General appearance -patient is alert.  He answers some questions.  He follows commands.  Clothing is clean.  He appears well-kept. Mental status -patient is pleasant.  He is unable to tell me the day of the week or the year. Chest - clear to auscultation, no wheezes, rales or rhonchi, symmetric air entry Heart - normal rate, regular rhythm, normal S1, S2, no murmurs, rubs, clicks or gallops Neurological -he ambulates with a cane and has a spastic gait. Extremities -no lower extremity edema. he has left-sided weakness in both the upper and lower extremity     Latest Ref Rng & Units 02/09/2024   12:16 PM 01/11/2024   11:33 AM 01/10/2024    5:05 AM  CMP  Glucose 70 - 99 mg/dL 621  97  308   BUN 8 - 23 mg/dL 14  10  21    Creatinine 0.61 - 1.24 mg/dL 6.57  8.46  9.62   Sodium 135 - 145 mmol/L 140  136  136   Potassium 3.5 - 5.1 mmol/L 4.4  4.5  4.2   Chloride 98 - 111 mmol/L 108  105  104   CO2 22 - 32 mmol/L 24  24  26    Calcium  8.9 - 10.3 mg/dL 9.0  8.5  8.6   Total Protein 6.5 - 8.1 g/dL  7.2   7.2   Total Bilirubin 0.0 - 1.2 mg/dL 0.6   0.6   Alkaline Phos 38 - 126 U/L 121   109   AST 15 - 41 U/L 18   33   ALT 0 - 44 U/L 19   47     Lipid Panel     Component Value Date/Time   CHOL 122 08/20/2023 1539   TRIG 196 (H) 08/20/2023 1539   HDL 32 (L) 08/20/2023 1539   CHOLHDL 3.8 08/20/2023 1539   CHOLHDL 2.5 05/29/2016 0943   VLDL 16 05/29/2016 0943   LDLCALC 58 08/20/2023 1539    CBC    Component Value Date/Time   WBC 5.7 02/09/2024 1216   RBC 4.24 02/09/2024 1216   HGB 13.7 02/09/2024 1216   HGB 14.2 10/26/2021 1001   HCT 41.4 02/09/2024 1216   HCT 41.7 10/26/2021 1001   PLT 180 02/09/2024 1216   PLT 223 10/26/2021 1001   MCV 97.6 02/09/2024 1216   MCV 95 10/26/2021 1001   MCH 32.3 02/09/2024 1216   MCHC 33.1 02/09/2024 1216   RDW 12.8 02/09/2024 1216   RDW 11.9 10/26/2021 1001   LYMPHSABS 0.7 01/10/2024 0505   LYMPHSABS 1.2 10/26/2021 1001   MONOABS 0.7 01/10/2024 0505   EOSABS 0.0 01/10/2024 0505   EOSABS 0.1 10/26/2021 1001   BASOSABS 0.0 01/10/2024 0505   BASOSABS 0.0 10/26/2021 1001    ASSESSMENT AND PLAN: 1. Seizure disorder (HCC) (Primary) Patient to continue Dilantin  200 mg at bedtime along with 25 mg, Keppra  1500 mg twice a day and lacosamide  150 mg twice a day.  2. Liver mass I will have my CMA call [facility] to schedule the MRI of the abdomen that was ordered.  Nephew request that appointment be on a Tuesday or Thursday.  3. Vascular dementia without behavioral disturbance, psychotic disturbance, mood disturbance, or anxiety, unspecified dementia severity (HCC) Advised nephew to consider putting a chime on the apartment door or some type of Bell so that he is alerted if patient wakes up in the middle of the night and wanders out of the house.  Continue Namenda   4. Seasonal allergies - loratadine (CLARITIN) 10 MG tablet; Take 1 tablet (10 mg total) by mouth daily.  Dispense: 30 tablet; Refill: 1   Patient was given the opportunity to ask questions.  Patient verbalized understanding of the plan and was able to repeat key elements of the plan.   This documentation was  completed using Paediatric nurse.  Any transcriptional errors are unintentional.  No orders of the defined types were placed in this encounter.    Requested Prescriptions    No prescriptions requested or ordered in this encounter    No follow-ups on file.  Concetta Dee, MD, FACP

## 2024-02-25 NOTE — Telephone Encounter (Signed)
 Called & spoke to the patient's nephew. Informed of [facility]'s phone number. Nephew expressed verbal understanding and will reach out and schedule an appointment. No further assistance at this time.

## 2024-03-12 ENCOUNTER — Encounter: Payer: Self-pay | Admitting: Podiatry

## 2024-03-12 ENCOUNTER — Ambulatory Visit (INDEPENDENT_AMBULATORY_CARE_PROVIDER_SITE_OTHER): Admitting: Podiatry

## 2024-03-12 DIAGNOSIS — B351 Tinea unguium: Secondary | ICD-10-CM | POA: Diagnosis not present

## 2024-03-12 DIAGNOSIS — M79674 Pain in right toe(s): Secondary | ICD-10-CM

## 2024-03-12 DIAGNOSIS — M79675 Pain in left toe(s): Secondary | ICD-10-CM | POA: Diagnosis not present

## 2024-03-12 DIAGNOSIS — D696 Thrombocytopenia, unspecified: Secondary | ICD-10-CM

## 2024-03-12 NOTE — Progress Notes (Signed)
 This patient presents to the office with chief complaint of long thick painful nails.  Patient says the nails are painful walking and wearing shoes.  This patient is unable to self treat.  This patient is unable to trim hisnails since she is unable to reach his nails.  he presents to the office for preventative foot care services.  General Appearance  Alert, conversant and in no acute stress.  Vascular  Dorsalis pedis and posterior tibial  pulses are palpable  bilaterally.  Capillary return is within normal limits  bilaterally. Temperature is within normal limits  bilaterally.  Neurologic  Senn-Weinstein monofilament wire test within normal limits  bilaterally. Muscle power within normal limits bilaterally.  Nails Thick disfigured discolored nails with subungual debris  from hallux to fifth toes bilaterally. No evidence of bacterial infection or drainage bilaterally.  Orthopedic  No limitations of motion  feet .  No crepitus or effusions noted.  No bony pathology or digital deformities noted.  Skin  normotropic skin with no porokeratosis noted bilaterally.  No signs of infections or ulcers noted.     Onychomycosis  Nails  B/L.  Pain in right toes  Pain in left toes  Debridement of nails both feet followed trimming the nails with dremel tool.    RTC 3 months.   Helane Gunther DPM

## 2024-03-14 ENCOUNTER — Other Ambulatory Visit: Payer: Self-pay | Admitting: Internal Medicine

## 2024-03-14 DIAGNOSIS — J302 Other seasonal allergic rhinitis: Secondary | ICD-10-CM

## 2024-03-21 ENCOUNTER — Other Ambulatory Visit: Payer: Self-pay | Admitting: Internal Medicine

## 2024-03-23 NOTE — Telephone Encounter (Signed)
 Rx 11/27/23 #360 1RF- too soon Requested Prescriptions  Pending Prescriptions Disp Refills   levETIRAcetam  (KEPPRA ) 750 MG tablet [Pharmacy Med Name: LEVETIRACETAM  750 MG TABLET] 360 tablet 1    Sig: TAKE 2 TABLETS (1,500 MG TOTAL) BY MOUTH 2 (TWO) TIMES DAILY.     Neurology:  Anticonvulsants - levetiracetam  Passed - 03/23/2024  1:59 PM      Passed - Cr in normal range and within 360 days    Creat  Date Value Ref Range Status  04/08/2017 0.93 0.70 - 1.25 mg/dL Final    Comment:      For patients > or = 71 years of age: The upper reference limit for Creatinine is approximately 13% higher for people identified as African-American.      Creatinine, Ser  Date Value Ref Range Status  02/09/2024 0.90 0.61 - 1.24 mg/dL Final         Passed - Completed PHQ-2 or PHQ-9 in the last 360 days      Passed - Valid encounter within last 12 months    Recent Outpatient Visits           1 month ago Seizure disorder [facility])   [facility] [place]. [facility] Lawrance Presume, MD   2 months ago Influenza A   [facility] Of [place]. [facility] [place], Shelvy Dickens M, [place]   6 months ago Seizure disorder [facility])   [facility] [place]. [facility] Collins Dean, NP   7 months ago Seizure [facility])   [facility] Of [place]. [facility] Lawrance Presume, MD   10 months ago Seizure [facility])    [facility] Of [place]. [facility] [place], [place], PA-C

## 2024-03-29 ENCOUNTER — Inpatient Hospital Stay: Admission: RE | Admit: 2024-03-29 | Source: Ambulatory Visit

## 2024-03-29 ENCOUNTER — Emergency Department (HOSPITAL_COMMUNITY)
Admission: EM | Admit: 2024-03-29 | Discharge: 2024-03-29 | Disposition: A | Discharge status: Home / Self Care | Attending: Emergency Medicine | Admitting: Emergency Medicine

## 2024-03-29 ENCOUNTER — Emergency Department (HOSPITAL_COMMUNITY)

## 2024-03-29 ENCOUNTER — Encounter (HOSPITAL_COMMUNITY): Payer: Self-pay

## 2024-03-29 DIAGNOSIS — Z7982 Long term (current) use of aspirin: Secondary | ICD-10-CM | POA: Insufficient documentation

## 2024-03-29 DIAGNOSIS — F0390 Unspecified dementia without behavioral disturbance: Secondary | ICD-10-CM | POA: Insufficient documentation

## 2024-03-29 DIAGNOSIS — R051 Acute cough: Secondary | ICD-10-CM | POA: Diagnosis not present

## 2024-03-29 DIAGNOSIS — R058 Other specified cough: Secondary | ICD-10-CM | POA: Diagnosis not present

## 2024-03-29 MED ORDER — BENZONATATE 100 MG PO CAPS
100.0000 mg | ORAL_CAPSULE | Freq: Three times a day (TID) | ORAL | Status: DC | PRN
Start: 2024-03-29 — End: 2024-05-14

## 2024-03-29 NOTE — ED Triage Notes (Signed)
 Pt arrives via POV with his nephew. Nephew reports that the patient has had a productive cough for about 1 week. Pt is Alert and oriented to his name. Hx of dementia. Nephew and patient denies any other symptoms. VSS.

## 2024-03-29 NOTE — Discharge Instructions (Signed)
 In addition to using over-the-counter Robitussin you can take the Tessalon  pearls that I have prescribed up to 3 times daily as needed.  Please follow-up with his primary care physician if the cough continues despite treatment.

## 2024-03-29 NOTE — ED Provider Notes (Signed)
 [place] EMERGENCY DEPARTMENT AT [facility] Provider Note   CSN: [id number] Arrival date & time: 03/29/24  1752     History  No chief complaint on file.   [name] is a 73 y.o. male with past medical history significant for dementia, encephalopathy, pneumonia, history of alcoholism who presents with concern for cough for 1 to 2 weeks, nephew wondering what they can give him, reported history of seizures and want something for cough that is safe with his seizure medication.  No fever, chills, cough is intermittent.  No expressed shortness of breath.  HPI     Home Medications Prior to Admission medications   Medication Sig Start Date End Date Taking? Authorizing Provider  aspirin  EC 81 MG tablet Take 1 tablet (81 mg total) by mouth daily. 03/04/20   Lawrance Presume, MD  benzonatate  (TESSALON ) 100 MG capsule Take 1 capsule (100 mg total) by mouth 3 (three) times daily as needed for cough. 03/29/24   Willaim Mode H, PA-C  guaiFENesin  (MUCINEX ) 600 MG 12 hr tablet Take 1 tablet (600 mg total) by mouth 2 (two) times daily. 02/09/24   Henderly, Britni A, PA-C  lacosamide  150 MG TABS Take 1 tablet (150 mg total) by mouth 2 (two) times daily. 01/12/24   Barbee Lew, MD  levETIRAcetam  (KEPPRA ) 750 MG tablet Take 2 tablets (1,500 mg total) by mouth 2 (two) times daily. Patient taking differently: Take 1,500 mg by mouth See admin instructions. Take 1,500 mg by mouth in the morning and evening 11/27/23   Lawrance Presume, MD  loratadine  (CLARITIN ) 10 MG tablet TAKE 1 TABLET BY MOUTH EVERY DAY 03/17/24   Lawrance Presume, MD  memantine  (NAMENDA ) 10 MG tablet Take 1 tablet (10 mg total) by mouth 2 (two) times daily. 02/19/24   Lawrance Presume, MD  ondansetron  (ZOFRAN ) 4 MG tablet Take 1 tablet (4 mg total) by mouth every 6 (six) hours as needed for nausea. 12/30/23   Jodeane Mulligan, DO  pantoprazole  (PROTONIX ) 40 MG tablet Take 1 tablet (40 mg total) by mouth  daily. 02/19/24   Lawrance Presume, MD  phenytoin  (DILANTIN ) 100 MG ER capsule Take 2 capsules (200 mg total) by mouth at bedtime. FOR SEIZURES 02/19/24   Lawrance Presume, MD  PHENYTOIN  INFATABS 50 MG tablet Chew 0.5 tablets (25 mg total) by mouth at bedtime. 02/19/24   Lawrance Presume, MD  rosuvastatin  (CRESTOR ) 10 MG tablet Take 1 tablet (10 mg total) by mouth daily. STOP Simvastatin  11/27/23   Lawrance Presume, MD      Allergies    Patient has no known allergies.    Review of Systems   Review of Systems  All other systems reviewed and are negative.   Physical Exam Updated Vital Signs BP 133/75 (BP Location: Right Arm)   Pulse 71   Temp 98.3 F (36.8 C) (Oral)   Resp 18   SpO2 98%  Physical Exam Vitals and nursing note reviewed.  Constitutional:      General: He is not in acute distress.    Appearance: Normal appearance.  HENT:     Head: Normocephalic and atraumatic.  Eyes:     General:        Right eye: No discharge.        Left eye: No discharge.  Cardiovascular:     Rate and Rhythm: Normal rate and regular rhythm.  Pulmonary:     Effort: Pulmonary effort is normal.  No respiratory distress.     Comments: No wheezing, rhonchi, stridor, rales, no respiratory distress, no active cough during my exam Musculoskeletal:        General: No deformity.  Skin:    General: Skin is warm and dry.  Neurological:     Mental Status: He is alert and oriented to person, place, and time.  Psychiatric:        Mood and Affect: Mood normal.        Behavior: Behavior normal.     ED Results / Procedures / Treatments   Labs (all labs ordered are listed, but only abnormal results are displayed) Labs Reviewed - No data to display  EKG None  Radiology DG Chest 2 View Result Date: 03/29/2024 CLINICAL DATA:  Productive cough for 1 week EXAM: CHEST - 2 VIEW COMPARISON:  02/09/2024 FINDINGS: Stable cardiomediastinal silhouette. No focal consolidation, pleural effusion, or  pneumothorax. No displaced rib fractures. IMPRESSION: No active cardiopulmonary disease. Electronically Signed   By: Rozell Cornet M.D.   On: 03/29/2024 18:37    Procedures Procedures    Medications Ordered in ED Medications - No data to display  ED Course/ Medical Decision Making/ A&P                                 Medical Decision Making Amount and/or Complexity of Data Reviewed Radiology: ordered.  Risk Prescription drug management.   There is a well-appearing 73 year old male with past medical history significant for dementia, encephalopathy who presents with concern for cough for 1-2 weeks.  Emergent differential diagnosis includes upper respiratory infection, chronic cough, bronchitis, pneumonia, versus other.  Have tried Robitussin at home with minimal relief.  No shortness of breath reported, no fever, no chills.  Has had history of pneumonia previously.  I independently interpreted plain film chest x-ray which shows no evidence of acute intrathoracic abnormality.  Vital signs stable in the emergency department.  Will prescribe Tessalon  [place], encouraged Robitussin, no other additional treatment at this time.  Encouraged PCP follow-up. Final Clinical Impression(s) / ED Diagnoses Final diagnoses:  Acute cough    Rx / DC Orders ED Discharge Orders          Ordered    benzonatate  (TESSALON ) 100 MG capsule  3 times daily PRN        03/29/24 1849              Chauntae Hults, Cosimo Diones, PA-C 03/29/24 1856    Kingsley, Victoria K, DO 03/29/24 1929

## 2024-03-30 ENCOUNTER — Other Ambulatory Visit: Payer: Self-pay

## 2024-03-30 NOTE — Telephone Encounter (Signed)
 Call to patient and patient's sister to schedule ed follow-up visit unable to reach VM left

## 2024-03-31 ENCOUNTER — Encounter: Payer: Self-pay | Admitting: Family Medicine

## 2024-03-31 ENCOUNTER — Ambulatory Visit: Payer: Self-pay

## 2024-03-31 ENCOUNTER — Ambulatory Visit: Attending: Family Medicine | Admitting: Family Medicine

## 2024-03-31 VITALS — BP 123/70 | HR 79 | Ht 68.0 in | Wt 167.6 lb

## 2024-03-31 DIAGNOSIS — R059 Cough, unspecified: Secondary | ICD-10-CM | POA: Diagnosis not present

## 2024-03-31 DIAGNOSIS — Z23 Encounter for immunization: Secondary | ICD-10-CM

## 2024-03-31 DIAGNOSIS — R160 Hepatomegaly, not elsewhere classified: Secondary | ICD-10-CM

## 2024-03-31 MED ORDER — PREDNISONE 20 MG PO TABS: 20.0000 mg | ORAL_TABLET | Freq: Every day | ORAL | 0 refills | Status: DC

## 2024-03-31 NOTE — Telephone Encounter (Signed)
 FYI Only or Action Required?: FYI only for provider  Patient was last seen in primary care on 02/21/2024 by Lawrance Presume, MD. Called Nurse Triage reporting Cough. Symptoms began several days ago. Interventions attempted: OTC medications: robitussin, Prescription medications: tesselon, and Rest, hydration, or home remedies. Symptoms are: gradually worsening.  Triage Disposition: See HCP Within 4 Hours (Or PCP Triage)  Patient/caregiver understands and will follow disposition?: Yes  Copied from CRM [phone]. Topic: Clinical - Red Word Triage >> Mar 31, 2024  1:21 PM Elle L wrote: Red Word that prompted transfer to Nurse Triage: The patient's nephew, Dejion Grillo, states that the patient went to the Emergency Room yesterday due to his productive cough. He was discharged with medication. However, the medication is not working and his cough is worsening. The patient's nephew states that the patient has Alzheimer's so it is difficult to know if he is experiencing pain. Reason for Disposition  [1] MILD difficulty breathing (e.g., minimal/no SOB at rest, SOB with walking, pulse <100) AND [2] still present when not coughing  Answer Assessment - Initial Assessment Questions 1. ONSET: "When did the cough begin?"      Several days 2. SEVERITY: "How bad is the cough today?"      strong 3. SPUTUM: "Describe the color of your sputum" (none, dry cough; clear, white, yellow, green)     Unsure-nephew calling patient has dementia 4. HEMOPTYSIS: "Are you coughing up any blood?" If so ask: "How much?" (flecks, streaks, tablespoons, etc.)     unsure 5. DIFFICULTY BREATHING: "Are you having difficulty breathing?" If Yes, ask: "How bad is it?" (e.g., mild, moderate, severe)    - MILD: No SOB at rest, mild SOB with walking, speaks normally in sentences, can lie down, no retractions, pulse < 100.    - MODERATE: SOB at rest, SOB with minimal exertion and prefers to sit, cannot lie down flat, speaks in phrases, mild  retractions, audible wheezing, pulse 100-120.    - SEVERE: Very SOB at rest, speaks in single words, struggling to breathe, sitting hunched forward, retractions, pulse > 120      Appears to have some sob 6. FEVER: "Do you have a fever?" If Yes, ask: "What is your temperature, how was it measured, and when did it start?"     denies 7. CARDIAC HISTORY: "Do you have any history of heart disease?" (e.g., heart attack, congestive heart failure)      yes 8. LUNG HISTORY: "Do you have any history of lung disease?"  (e.g., pulmonary embolus, asthma, emphysema)     no 9. PE RISK FACTORS: "Do you have a history of blood clots?" (or: recent major surgery, recent prolonged travel, bedridden)     yes 10. OTHER SYMPTOMS: "Do you have any other symptoms?" (e.g., runny nose, wheezing, chest pain)       Flu like symptoms Additional info: emergency room 03/29/24, cough worsening. Patient has dementia and unreliable reporter, nephew would like him evaluated today.  Protocols used: Cough - Acute Productive-A-AH

## 2024-03-31 NOTE — Progress Notes (Signed)
 Subjective:  Patient ID: [name], male    DOB: 10/07/1951  Age: 73 y.o. MRN: [id number]  CC: Medical Management of Chronic Issues (Coughing up flem)     Discussed the use of AI scribe software for clinical note transcription with the patient, who gave verbal consent to proceed.  History of Present Illness [name] is a 73 year old male patient of Dr. Lincoln Renshaw with a history of dementia, seizures who presents with a persistent cough. He is accompanied by his nephew, 25.  He has had a productive cough with mucus for two weeks, worsening at night when lying down. Tessalon  pills have not alleviated the cough. He is also taking Claritin  for allergies. An x-ray in the emergency room showed no acute cardiopulmonary process.  He is unsure about postnasal drip and he is unsure about wheezing. He does not experience daytime dyspnea, but his nephew notes some trouble breathing at night when lying down.  He was supposed to undergo an MRI of the abdomen to follow-up on liver mass from his CT scan of the abdomen but he no-showed as his nephew states that he did not know the location of the imaging.  Past Medical History:  Diagnosis Date   Alcoholism (HCC)    History of, not active   Chronic hepatitis C (HCC)    Dementia (HCC)    Diverticulosis    Gait disorder    Gingival hypertrophy    Secondary to Dilantin    Hemiparesis and alteration of sensations as late effects of stroke (HCC) 01/25/2017   Left hemiparesis   Hiatal hernia    History of alcoholism (HCC)    Hx of ischemic vertebrobasilar artery thalamic stroke    Right   Internal hemorrhoids    Memory disorder 03/04/2013   Primary biliary cirrhosis (HCC)    Seizures (HCC)    Stroke (HCC)    Right frontal, right thalamic   Tubular adenoma of colon    Ulnar neuropathy of left upper extremity     Past Surgical History:  Procedure Laterality Date   COLONOSCOPY WITH PROPOFOL  N/A 07/10/2017   Procedure: COLONOSCOPY WITH  PROPOFOL ;  Surgeon: Felecia Hopper, MD;  Location: [facility];  Service: Gastroenterology;  Laterality: N/A;   ESOPHAGOGASTRODUODENOSCOPY (EGD) WITH PROPOFOL  N/A 07/10/2017   Procedure: ESOPHAGOGASTRODUODENOSCOPY (EGD) WITH PROPOFOL ;  Surgeon: Felecia Hopper, MD;  Location: [facility];  Service: Gastroenterology;  Laterality: N/A;   HIP ARTHROPLASTY Left 11/26/2013   Procedure: LEFT HIP HEMIARTHROPLASTY;  Surgeon: Arnie Lao, MD;  Location: [facility] OR;  Service: Orthopedics;  Laterality: Left;   WRIST SURGERY Left 95 & 96    Family History  Problem Relation Age of Onset   Pulmonary embolism Mother    Liver disease Father    Diabetes Sister    Hypertension Sister    Hypertension Sister    Hypertension Sister    Seizures Neg Hx     Social History   Socioeconomic History   Marital status: Divorced    Spouse name: Not on file   Number of children: 0   Years of education: 10   Highest education level: Not on file  Occupational History   Not on file  Tobacco Use   Smoking status: Former    Current packs/day: 0.00    Types: Cigarettes    Quit date: 01/02/2014    Years since quitting: 10.2   Smokeless tobacco: Former  Building services engineer status: Never Used  Substance and Sexual  Activity   Alcohol use: No    Alcohol/week: 0.0 standard drinks of alcohol    Comment: Former Alcoholic   Drug use: No   Sexual activity: Yes  Other Topics Concern   Not on file  Social History Narrative   ** Merged History Encounter **       Patient is single and lives with a friend and her son. Patient does not have any children. Patient is disabled. Patient has a 10 th grade education. Patient is left-handed. Patient drinks some soda but not everyday.   Social Drivers of Corporate investment banker Strain: Low Risk  (06/25/2023)   Overall Financial Resource Strain (CARDIA)    Difficulty of Paying Living Expenses: Not hard at all  Food Insecurity: No Food Insecurity (01/14/2024)    Hunger Vital Sign    Worried About Running Out of Food in the Last Year: Never true    Ran Out of Food in the Last Year: Never true  Transportation Needs: No Transportation Needs (01/14/2024)   PRAPARE - Administrator, Civil Service (Medical): No    Lack of Transportation (Non-Medical): No  Physical Activity: Inactive (01/14/2024)   Exercise Vital Sign    Days of Exercise per Week: 0 days    Minutes of Exercise per Session: 0 min  Stress: No Stress Concern Present (06/25/2023)   [employer] of Occupational Health - Occupational Stress Questionnaire    Feeling of Stress : Not at all  Social Connections: Moderately Isolated (01/14/2024)   Social Connection and Isolation Panel [NHANES]    Frequency of Communication with Friends and Family: Once a week    Frequency of Social Gatherings with Friends and Family: Twice a week    Attends Religious Services: 1 to 4 times per year    Active Member of [employer] or Organizations: No    Attends Banker Meetings: Never    Marital Status: Divorced    No Known Allergies  Outpatient Medications Prior to Visit  Medication Sig Dispense Refill   aspirin  EC 81 MG tablet Take 1 tablet (81 mg total) by mouth daily. 100 tablet 0   benzonatate  (TESSALON ) 100 MG capsule Take 1 capsule (100 mg total) by mouth 3 (three) times daily as needed for cough. 30 capsule O   guaiFENesin  (MUCINEX ) 600 MG 12 hr tablet Take 1 tablet (600 mg total) by mouth 2 (two) times daily. 30 tablet 0   lacosamide  150 MG TABS Take 1 tablet (150 mg total) by mouth 2 (two) times daily. 60 tablet 4   levETIRAcetam  (KEPPRA ) 750 MG tablet Take 2 tablets (1,500 mg total) by mouth 2 (two) times daily. (Patient taking differently: Take 1,500 mg by mouth See admin instructions. Take 1,500 mg by mouth in the morning and evening) 360 tablet 1   loratadine  (CLARITIN ) 10 MG tablet TAKE 1 TABLET BY MOUTH EVERY DAY 90 tablet 1   memantine  (NAMENDA ) 10 MG tablet Take 1  tablet (10 mg total) by mouth 2 (two) times daily. 180 tablet 0   ondansetron  (ZOFRAN ) 4 MG tablet Take 1 tablet (4 mg total) by mouth every 6 (six) hours as needed for nausea. 20 tablet 0   pantoprazole  (PROTONIX ) 40 MG tablet Take 1 tablet (40 mg total) by mouth daily. 90 tablet 0   phenytoin  (DILANTIN ) 100 MG ER capsule Take 2 capsules (200 mg total) by mouth at bedtime. FOR SEIZURES 180 capsule 2   PHENYTOIN  INFATABS 50 MG tablet Chew 0.5  tablets (25 mg total) by mouth at bedtime. 45 tablet 1   rosuvastatin  (CRESTOR ) 10 MG tablet Take 1 tablet (10 mg total) by mouth daily. STOP Simvastatin  90 tablet 3   No facility-administered medications prior to visit.     ROS Review of Systems  Constitutional:  Negative for activity change and appetite change.  HENT:  Negative for sinus pressure and sore throat.   Respiratory:  Positive for cough. Negative for chest tightness, shortness of breath and wheezing.   Cardiovascular:  Negative for chest pain and palpitations.  Gastrointestinal:  Negative for abdominal distention, abdominal pain and constipation.  Genitourinary: Negative.   Musculoskeletal: Negative.   Psychiatric/Behavioral:  Negative for behavioral problems and dysphoric mood.     Objective:  BP 123/70   Pulse 79   Ht 5\' 8"  (1.727 m)   Wt 167 lb 9.6 oz (76 kg)   SpO2 100%   BMI 25.48 kg/m      03/31/2024    2:17 PM 03/29/2024    5:59 PM 02/21/2024   11:58 AM  BP/Weight  Systolic BP 123 133 117  Diastolic BP 70 75 78  Wt. (Lbs) 167.6  162  BMI 25.48 kg/m2  24.63 kg/m2      Physical Exam Constitutional:      Appearance: He is well-developed.  Cardiovascular:     Rate and Rhythm: Normal rate.     Heart sounds: Normal heart sounds. No murmur heard. Pulmonary:     Effort: Pulmonary effort is normal.     Breath sounds: Normal breath sounds. No wheezing or rales.  Chest:     Chest wall: No tenderness.  Abdominal:     General: Bowel sounds are normal. There is no  distension.     Palpations: Abdomen is soft. There is no mass.     Tenderness: There is no abdominal tenderness.  Musculoskeletal:        General: Normal range of motion.     Right lower leg: No edema.     Left lower leg: No edema.  Neurological:     Mental Status: He is alert. Mental status is at baseline.  Psychiatric:        Mood and Affect: Mood normal.        Latest Ref Rng & Units 02/09/2024   12:16 PM 01/11/2024   11:33 AM 01/10/2024    5:05 AM  CMP  Glucose 70 - 99 mg/dL 161  97  096   BUN 8 - 23 mg/dL 14  10  21    Creatinine 0.61 - 1.24 mg/dL 0.45  4.09  8.11   Sodium 135 - 145 mmol/L 140  136  136   Potassium 3.5 - 5.1 mmol/L 4.4  4.5  4.2   Chloride 98 - 111 mmol/L 108  105  104   CO2 22 - 32 mmol/L 24  24  26    Calcium  8.9 - 10.3 mg/dL 9.0  8.5  8.6   Total Protein 6.5 - 8.1 g/dL 7.2   7.2   Total Bilirubin 0.0 - 1.2 mg/dL 0.6   0.6   Alkaline Phos 38 - 126 U/L 121   109   AST 15 - 41 U/L 18   33   ALT 0 - 44 U/L 19   47     Lipid Panel     Component Value Date/Time   CHOL 122 08/20/2023 1539   TRIG 196 (H) 08/20/2023 1539   HDL 32 (L) 08/20/2023 1539  CHOLHDL 3.8 08/20/2023 1539   CHOLHDL 2.5 05/29/2016 0943   VLDL 16 05/29/2016 0943   LDLCALC 58 08/20/2023 1539    CBC    Component Value Date/Time   WBC 5.7 02/09/2024 1216   RBC 4.24 02/09/2024 1216   HGB 13.7 02/09/2024 1216   HGB 14.2 10/26/2021 1001   HCT 41.4 02/09/2024 1216   HCT 41.7 10/26/2021 1001   PLT 180 02/09/2024 1216   PLT 223 10/26/2021 1001   MCV 97.6 02/09/2024 1216   MCV 95 10/26/2021 1001   MCH 32.3 02/09/2024 1216   MCHC 33.1 02/09/2024 1216   RDW 12.8 02/09/2024 1216   RDW 11.9 10/26/2021 1001   LYMPHSABS 0.7 01/10/2024 0505   LYMPHSABS 1.2 10/26/2021 1001   MONOABS 0.7 01/10/2024 0505   EOSABS 0.0 01/10/2024 0505   EOSABS 0.1 10/26/2021 1001   BASOSABS 0.0 01/10/2024 0505   BASOSABS 0.0 10/26/2021 1001    Lab Results  Component Value Date   HGBA1C 5.3  05/23/2016      1. Cough, unspecified type (Primary) Uncontrolled on Robitussin, Tessalon  Perles and loratadine  Possible postnasal drip No infectious process noted on imaging Short course of prednisone  added - predniSONE  (DELTASONE ) 20 MG tablet; Take 1 tablet (20 mg total) by mouth daily with breakfast.  Dispense: 5 tablet; Refill: 0  2. Liver mass We have provided the number to the imaging center so he can reschedule his next MRI of the abdomen to follow-up on his liver mass   Meds ordered this encounter  Medications   predniSONE  (DELTASONE ) 20 MG tablet    Sig: Take 1 tablet (20 mg total) by mouth daily with breakfast.    Dispense:  5 tablet    Refill:  0    Follow-up: Return for previously scheduled appointment.       Joaquin Mulberry, MD, FAAFP. [facility] [place], [place] [phone]   03/31/2024, 2:51 PM

## 2024-04-08 ENCOUNTER — Ambulatory Visit
Admission: RE | Admit: 2024-04-08 | Discharge: 2024-04-08 | Disposition: A | Discharge status: Home / Self Care | Source: Ambulatory Visit | Attending: Physician Assistant | Admitting: Physician Assistant

## 2024-04-08 DIAGNOSIS — K7689 Other specified diseases of liver: Secondary | ICD-10-CM | POA: Diagnosis not present

## 2024-04-08 DIAGNOSIS — R160 Hepatomegaly, not elsewhere classified: Secondary | ICD-10-CM

## 2024-04-08 DIAGNOSIS — C220 Liver cell carcinoma: Secondary | ICD-10-CM | POA: Diagnosis not present

## 2024-04-08 MED ORDER — GADOPICLENOL 0.5 MMOL/ML IV SOLN
10.0000 mL | Freq: Once | INTRAVENOUS | Status: AC | PRN
  Administered 2024-04-08: 8 mL via INTRAVENOUS

## 2024-04-11 ENCOUNTER — Encounter (HOSPITAL_COMMUNITY): Payer: Self-pay

## 2024-04-11 ENCOUNTER — Emergency Department (HOSPITAL_COMMUNITY)
Admission: EM | Admit: 2024-04-11 | Discharge: 2024-04-11 | Disposition: A | Discharge status: Home / Self Care | Attending: Emergency Medicine | Admitting: Emergency Medicine

## 2024-04-11 ENCOUNTER — Other Ambulatory Visit: Payer: Self-pay

## 2024-04-11 DIAGNOSIS — R9431 Abnormal electrocardiogram [ECG] [EKG]: Secondary | ICD-10-CM | POA: Diagnosis not present

## 2024-04-11 DIAGNOSIS — R569 Unspecified convulsions: Secondary | ICD-10-CM | POA: Insufficient documentation

## 2024-04-11 DIAGNOSIS — F0280 Dementia in other diseases classified elsewhere without behavioral disturbance: Secondary | ICD-10-CM | POA: Diagnosis not present

## 2024-04-11 DIAGNOSIS — G308 Other Alzheimer's disease: Secondary | ICD-10-CM | POA: Insufficient documentation

## 2024-04-11 DIAGNOSIS — Z7982 Long term (current) use of aspirin: Secondary | ICD-10-CM | POA: Insufficient documentation

## 2024-04-11 DIAGNOSIS — R410 Disorientation, unspecified: Secondary | ICD-10-CM | POA: Diagnosis not present

## 2024-04-11 LAB — CBC WITH DIFFERENTIAL/PLATELET
Abs Immature Granulocytes: 0.05 10*3/uL (ref 0.00–0.07)
Basophils Absolute: 0 10*3/uL (ref 0.0–0.1)
Basophils Relative: 1 %
Eosinophils Absolute: 0.2 10*3/uL (ref 0.0–0.5)
Eosinophils Relative: 2 %
HCT: 42 % (ref 39.0–52.0)
Hemoglobin: 14.2 g/dL (ref 13.0–17.0)
Immature Granulocytes: 1 %
Lymphocytes Relative: 23 %
Lymphs Abs: 1.5 10*3/uL (ref 0.7–4.0)
MCH: 32.3 pg (ref 26.0–34.0)
MCHC: 33.8 g/dL (ref 30.0–36.0)
MCV: 95.7 fL (ref 80.0–100.0)
Monocytes Absolute: 0.6 10*3/uL (ref 0.1–1.0)
Monocytes Relative: 9 %
Neutro Abs: 4.2 10*3/uL (ref 1.7–7.7)
Neutrophils Relative %: 64 %
Platelets: 183 10*3/uL (ref 150–400)
RBC: 4.39 MIL/uL (ref 4.22–5.81)
RDW: 12.3 % (ref 11.5–15.5)
WBC: 6.6 10*3/uL (ref 4.0–10.5)
nRBC: 0 % (ref 0.0–0.2)

## 2024-04-11 LAB — COMPREHENSIVE METABOLIC PANEL WITH GFR
ALT: 21 U/L (ref 0–44)
AST: 22 U/L (ref 15–41)
Albumin: 3.7 g/dL (ref 3.5–5.0)
Alkaline Phosphatase: 134 U/L — ABNORMAL HIGH (ref 38–126)
Anion gap: 9 (ref 5–15)
BUN: 16 mg/dL (ref 8–23)
CO2: 24 mmol/L (ref 22–32)
Calcium: 8.8 mg/dL — ABNORMAL LOW (ref 8.9–10.3)
Chloride: 104 mmol/L (ref 98–111)
Creatinine, Ser: 0.89 mg/dL (ref 0.61–1.24)
GFR, Estimated: >60 mL/min (ref >60)
Glucose, Bld: 91 mg/dL (ref 70–99)
Potassium: 4.3 mmol/L (ref 3.5–5.1)
Sodium: 137 mmol/L (ref 135–145)
Total Bilirubin: 1.1 mg/dL (ref 0.0–1.2)
Total Protein: 7.3 g/dL (ref 6.5–8.1)

## 2024-04-11 LAB — CBG MONITORING, ED: Glucose-Capillary: 89 mg/dL (ref 70–99)

## 2024-04-11 LAB — PHENYTOIN LEVEL, TOTAL: Phenytoin Lvl: 15.1 ug/mL (ref 10.0–20.0)

## 2024-04-11 NOTE — ED Provider Notes (Signed)
 [place] EMERGENCY DEPARTMENT AT [facility] Provider Note  CSN: [id number] Arrival date & time: 04/11/24 0246  Chief Complaint(s) Seizures  HPI [name] is a 73 y.o. male with a past medical history listed below including Alzheimer's dementia, seizures on Keppra  and fosphenytoin who presents to the emergency department for seizures at home.  Witnessed by his caregiver.  Tonic-clonic seizures.  Family member reports that the patient has been compliant with his medications.  No recent fevers or infections.  No vomiting.  No diarrhea. Now back to baseline.  The history is provided by the patient.    Past Medical History Past Medical History:  Diagnosis Date   Alcoholism (HCC)    History of, not active   Chronic hepatitis C (HCC)    Dementia (HCC)    Diverticulosis    Gait disorder    Gingival hypertrophy    Secondary to Dilantin    Hemiparesis and alteration of sensations as late effects of stroke (HCC) 01/25/2017   Left hemiparesis   Hiatal hernia    History of alcoholism (HCC)    Hx of ischemic vertebrobasilar artery thalamic stroke    Right   Internal hemorrhoids    Memory disorder 03/04/2013   Primary biliary cirrhosis (HCC)    Seizures (HCC)    Stroke (HCC)    Right frontal, right thalamic   Tubular adenoma of colon    Ulnar neuropathy of left upper extremity    Patient Active Problem List   Diagnosis Date Noted   Community acquired pneumonia 01/10/2024   ARF (acute renal failure) (HCC) 01/10/2024   GERD (gastroesophageal reflux disease) 01/10/2024   Pneumonia 01/10/2024   Thrombocytopenia (HCC) 12/29/2023   Acute metabolic encephalopathy 12/29/2023   Liver mass 12/29/2023   Influenza A 12/29/2023   Seizure disorder (HCC) 12/29/2023   Dementia (HCC) 12/29/2023   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   Vascular dementia with other behavioral disturbance, unspecified dementia severity (HCC) 08/20/2023   Pain due to onychomycosis of  toenails of both feet 02/28/2023   Sepsis (HCC) 02/23/2023   Barrett's esophagus 12/25/2021   Bell's palsy 08/23/2020   Elevated alkaline phosphatase level 11/17/2019   Adenomatous polyp of colon 09/02/2017   Functional urinary incontinence 09/02/2017   Hemiparesis and alteration of sensations as late effects of stroke (HCC) 01/25/2017   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/23/2015   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 Authorizing Provider  aspirin  EC 81 MG tablet Take 1 tablet (81 mg total) by mouth daily. 03/04/20   Vicci Barnie NOVAK, MD  benzonatate  (TESSALON ) 100 MG capsule Take 1 capsule (100 mg total) by mouth 3 (three) times daily as needed for cough. 03/29/24   Prosperi, Christian H, PA-C  guaiFENesin  (MUCINEX ) 600 MG 12 hr tablet Take 1 tablet (600 mg total) by mouth 2 (two) times daily. 02/09/24   Henderly, Britni A, PA-C  lacosamide  150 MG TABS Take 1 tablet (150 mg total) by mouth 2 (two) times daily. 01/12/24   Will Almarie MATSU, MD  levETIRAcetam  (KEPPRA ) 750 MG tablet Take 2 tablets (1,500  mg total) by mouth 2 (two) times daily. Patient taking differently: Take 1,500 mg by mouth See admin instructions. Take 1,500 mg by mouth in the morning and evening 11/27/23   Vicci Barnie NOVAK, MD  loratadine  (CLARITIN ) 10 MG tablet TAKE 1 TABLET BY MOUTH EVERY DAY 03/17/24   Vicci Barnie NOVAK, MD  memantine  (NAMENDA ) 10 MG tablet Take 1 tablet (10 mg total) by mouth 2 (two) times daily. 02/19/24   Vicci Barnie NOVAK, MD  ondansetron  (ZOFRAN ) 4 MG tablet Take  1 tablet (4 mg total) by mouth every 6 (six) hours as needed for nausea. 12/30/23   Arlon Carliss ORN, DO  pantoprazole  (PROTONIX ) 40 MG tablet Take 1 tablet (40 mg total) by mouth daily. 02/19/24   Vicci Barnie NOVAK, MD  phenytoin  (DILANTIN ) 100 MG ER capsule Take 2 capsules (200 mg total) by mouth at bedtime. FOR SEIZURES 02/19/24   Vicci Barnie NOVAK, MD  PHENYTOIN  INFATABS 50 MG tablet Chew 0.5 tablets (25 mg total) by mouth at bedtime. 02/19/24   Vicci Barnie NOVAK, MD  predniSONE  (DELTASONE ) 20 MG tablet Take 1 tablet (20 mg total) by mouth daily with breakfast. 03/31/24   Delbert Clam, MD  rosuvastatin  (CRESTOR ) 10 MG tablet Take 1 tablet (10 mg total) by mouth daily. STOP Simvastatin  11/27/23   Vicci Barnie NOVAK, MD                                                                                                                                    Allergies Patient has no known allergies.  Review of Systems Review of Systems As noted in HPI  Physical Exam Vital Signs  I have reviewed the triage vital signs BP 124/75   Pulse 60   Temp 98 F (36.7 C)   Resp 15   Ht 5' 8 (1.727 m)   Wt 74.8 kg   SpO2 97%   BMI 25.09 kg/m   Physical Exam Vitals reviewed.  Constitutional:      General: He is not in acute distress.    Appearance: He is well-developed. He is not diaphoretic.  HENT:     Head: Normocephalic and atraumatic.     Right Ear: External ear normal.     Left Ear: External ear normal.     Nose: Nose normal.     Mouth/Throat:     Mouth: Mucous membranes are moist.   Eyes:     General: No scleral icterus.    Conjunctiva/sclera: Conjunctivae normal.   Neck:     Trachea: Phonation normal.   Cardiovascular:     Rate and Rhythm: Normal rate and regular rhythm.  Pulmonary:     Effort: Pulmonary effort is normal. No respiratory distress.     Breath sounds: No stridor.  Abdominal:     General: There is no distension.   Musculoskeletal:        General: Normal range of  motion.  Cervical back: Normal range of motion.   Neurological:     Mental Status: He is alert and oriented to person, place, and time.   Psychiatric:        Behavior: Behavior normal.     ED Results and Treatments Labs (all labs ordered are listed, but only abnormal results are displayed) Labs Reviewed  COMPREHENSIVE METABOLIC PANEL WITH GFR - Abnormal; Notable for the following components:      Result Value   Calcium  8.8 (*)    Alkaline Phosphatase 134 (*)    All other components within normal limits  PHENYTOIN  LEVEL, TOTAL  CBC WITH DIFFERENTIAL/PLATELET  CBG MONITORING, ED                                                                                                                         EKG  EKG Interpretation Date/Time:  Saturday April 11 2024 03:02:35 EDT Ventricular Rate:  77 PR Interval:  188 QRS Duration:  82 QT Interval:  386 QTC Calculation: 437 R Axis:   66  Text Interpretation: Sinus rhythm Confirmed by Trine Likes ([id number]) on 04/11/2024 3:47:55 AM       Radiology No results found.  Medications Ordered in ED Medications - No data to display Procedures Procedures  (including critical care time) Medical Decision Making / ED Course   Medical Decision Making Amount and/or Complexity of Data Reviewed Labs: ordered. Decision-making details documented in ED Course.    Labs reassuring without electrolyte derangements.  No anemia.  phenytoin  level is therapeutic.    Final Clinical Impression(s) / ED Diagnoses Final diagnoses:  Seizure [facility])   The patient appears reasonably screened and/or stabilized for discharge and I doubt any other medical condition or other [facility] requiring further screening, evaluation, or treatment in the ED at this time. I have discussed the findings, Dx and Tx plan with the patient/family who expressed understanding and agree(s) with the plan. Discharge instructions discussed at length. The patient/family was given strict  return precautions who verbalized understanding of the instructions. No further questions at time of discharge.  Disposition: Discharge  Condition: Good  ED Discharge Orders     None       Follow Up: Vicci Barnie NOVAK, MD [address] [place] [address] [place] [address] [phone]  Call  to schedule an appointment for close follow up    This chart was dictated using voice recognition software.  Despite best efforts to proofread,  errors can occur which can change the documentation meaning.    Trine Likes Moder, MD 04/11/24 [phone]

## 2024-04-11 NOTE — ED Triage Notes (Signed)
 Arrives [facility] from home after family report 2 tonic-clonic seizures unknown duration that occurred within 10 minutes.   Paramedics report pt appeared postictal on arrival but does have hx of dementia.  Reported compliance with Keppra  and Dilantin .

## 2024-04-15 ENCOUNTER — Telehealth: Payer: Self-pay | Admitting: Internal Medicine

## 2024-04-15 DIAGNOSIS — R160 Hepatomegaly, not elsewhere classified: Secondary | ICD-10-CM

## 2024-04-15 NOTE — Addendum Note (Signed)
 Addended by: VICCI SOBER B on: 04/15/2024 05:05 PM   Modules accepted: Orders

## 2024-04-15 NOTE — Telephone Encounter (Signed)
 Spoke with patient's caregiver, his nephew, Lorella.   Advised per PCP that the MRI of the abdomen shows that the mass in the liver is concerning for possible cancer. Comfirmed best contact # as [phone]. [name] apologized that he did not answer earlier call. He was in dialysis at the time. Voiced that it was ok to send referral to  gastroenterologist.  Lorella was very appreciative for the call back.

## 2024-04-15 NOTE — Telephone Encounter (Signed)
 Altamese please try to get him in with [facility] as soon as possible. Request that appt be scheduled for Tue or a Thursday as pt's care giver goes to dialysis on Mon, Wed and Friday.

## 2024-04-15 NOTE — Telephone Encounter (Signed)
 I reviewed the results of the MRI of the abdomen that was done.  The mass in the liver has grown and is concerning for liver cancer per radiologist. I tried contacting patient's caregiver, his nephew [name] at [phone].  A male's voice is on the in-box message.  I left a message stating that I am Dr. Vicci and I would like a call back at [phone]. I then tried calling patient's sister Bruna whose PC is listed on his chart.  Her VMB is full and I was not able to leave a message. I then called his niece Verneita whose # is also on the chart.  I left a voicemail message informing that I am Dr. Vicci and that I am trying to reach her uncle.  Requested that they give me a call back.  I again left the callback number.  Cassandra/Clarisa: Please see if you can reach this patient's caregiver, his nephew, 2.  Let him know that the MRI of the abdomen shows that the mass in the liver is concerning for possible cancer.  I will submit a referral for him to see the gastroenterologist.  Before I do so, please confirm that the [phone] is the best # for him to be reached. The specialist would probably not leave a message at this # because an unidentifed male's voice is on the voicemail box. Please let me know after you speak with him so I can place the referral order.

## 2024-04-19 NOTE — Progress Notes (Deleted)
 Joshua Console, PA-C [address] [place], [place]  [address] Phone: [phone]   Primary Care Physician: Joshua Barnie NOVAK, MD  Primary Gastroenterologist:  Joshua Console, PA-C / ***  Chief Complaint: Liver mass, primary biliary cirrhosis       HPI:   [name] is a 73 y.o. male returns for follow-up of primary biliary cirrhosis.  Evaluate liver mass.  Here today with his sister [name].  Currently on Urso .  04/08/2024 abdominal MRI with and without contrast: 3.2 cm right hepatic lobe mass, concerning for hepatocellular carcinoma.  Additional scattered liver cysts.  No other worrisome findings.  No enlarged lymph nodes.  Previous abdominal pelvic CT 12/2023 showed right liver mass measured 2.2 cm.  Current symptoms:  PMH: alcohol use disorder (abstinent fot 15+ years), dementia, seizures, CVA  and chronic hepatitis C GT1a treated with Harvoni  07/2016 x 1 month with SVR and PBC (diagnosed with elevated Alk phos, ANA and AMA levels 10/2019) on Urso  and adenomatous and tubulovillous adenomatous colon polyps.  Has mild dementia with intermittent confusion.  History of seizure disorder.  Recently went to [facility], ED 04/11/2024 after having tonic-clonic seizure at home witnessed by caregiver.   He was last seen by Dr. Albertus 11/14/2020, at that time his alk phos level was stable and he was instructed to continue ursodiol  500 mg twice daily.    11/2021 colonoscopy by Dr. Albertus: 3 small (4 mm to 6 mm) adenomatous polyps removed.  Medium internal hemorrhoids.  Excellent prep.  3-year repeat.  11/2021 EGD by Dr. Albertus: 3 cm hiatal hernia.  Short segment Barrett's without dysplasia.  Mild gastritis.  Normal duodenum.  He was started on pantoprazole  40 Mg daily.  3-year repeat (due 11/2024).  Colonoscopy in 2018: adenomatous and tubulovillous adenomatous polyps     Current Outpatient Medications  Medication Sig Dispense Refill   aspirin  EC 81 MG tablet Take 1 tablet (81 mg total)  by mouth daily. 100 tablet 0   benzonatate  (TESSALON ) 100 MG capsule Take 1 capsule (100 mg total) by mouth 3 (three) times daily as needed for cough. 30 capsule O   guaiFENesin  (MUCINEX ) 600 MG 12 hr tablet Take 1 tablet (600 mg total) by mouth 2 (two) times daily. 30 tablet 0   lacosamide  150 MG TABS Take 1 tablet (150 mg total) by mouth 2 (two) times daily. 60 tablet 4   levETIRAcetam  (KEPPRA ) 750 MG tablet Take 2 tablets (1,500 mg total) by mouth 2 (two) times daily. (Patient taking differently: Take 1,500 mg by mouth See admin instructions. Take 1,500 mg by mouth in the morning and evening) 360 tablet 1   loratadine  (CLARITIN ) 10 MG tablet TAKE 1 TABLET BY MOUTH EVERY DAY 90 tablet 1   memantine  (NAMENDA ) 10 MG tablet Take 1 tablet (10 mg total) by mouth 2 (two) times daily. 180 tablet 0   ondansetron  (ZOFRAN ) 4 MG tablet Take 1 tablet (4 mg total) by mouth every 6 (six) hours as needed for nausea. 20 tablet 0   pantoprazole  (PROTONIX ) 40 MG tablet Take 1 tablet (40 mg total) by mouth daily. 90 tablet 0   phenytoin  (DILANTIN ) 100 MG ER capsule Take 2 capsules (200 mg total) by mouth at bedtime. FOR SEIZURES 180 capsule 2   PHENYTOIN  INFATABS 50 MG tablet Chew 0.5 tablets (25 mg total) by mouth at bedtime. 45 tablet 1   predniSONE  (DELTASONE ) 20 MG tablet Take 1 tablet (20 mg total) by mouth  daily with breakfast. 5 tablet 0   rosuvastatin  (CRESTOR ) 10 MG tablet Take 1 tablet (10 mg total) by mouth daily. STOP Simvastatin  90 tablet 3   No current facility-administered medications for this visit.    Allergies as of 04/20/2024   (No Known Allergies)    Past Medical History:  Diagnosis Date   Alcoholism (HCC)    History of, not active   Chronic hepatitis C (HCC)    Dementia (HCC)    Diverticulosis    Gait disorder    Gingival hypertrophy    Secondary to Dilantin    Hemiparesis and alteration of sensations as late effects of stroke (HCC) 01/25/2017   Left hemiparesis   Hiatal hernia     History of alcoholism (HCC)    Hx of ischemic vertebrobasilar artery thalamic stroke    Right   Internal hemorrhoids    Memory disorder 03/04/2013   Primary biliary cirrhosis (HCC)    Seizures (HCC)    Stroke (HCC)    Right frontal, right thalamic   Tubular adenoma of colon    Ulnar neuropathy of left upper extremity     Past Surgical History:  Procedure Laterality Date   COLONOSCOPY WITH PROPOFOL  N/A 07/10/2017   Procedure: COLONOSCOPY WITH PROPOFOL ;  Surgeon: Joshua Claw, MD;  Location: [facility];  Service: Gastroenterology;  Laterality: N/A;   ESOPHAGOGASTRODUODENOSCOPY (EGD) WITH PROPOFOL  N/A 07/10/2017   Procedure: ESOPHAGOGASTRODUODENOSCOPY (EGD) WITH PROPOFOL ;  Surgeon: Joshua Claw, MD;  Location: [facility];  Service: Gastroenterology;  Laterality: N/A;   HIP ARTHROPLASTY Left 11/26/2013   Procedure: LEFT HIP HEMIARTHROPLASTY;  Surgeon: Joshua CINDERELLA Poli, MD;  Location: [facility];  Service: Orthopedics;  Laterality: Left;   WRIST SURGERY Left 95 & 96    Review of Systems:    All systems reviewed and negative except where noted in HPI.    Physical Exam:  There were no vitals taken for this visit. No LMP for male patient.  General: Well-nourished, well-developed in no acute distress.  Lungs: Clear to auscultation bilaterally. Non-labored. Heart: Regular rate and rhythm, no murmurs rubs or gallops.  Abdomen: Bowel sounds are normal; Abdomen is Soft; No hepatosplenomegaly, masses or hernias;  No Abdominal Tenderness; No guarding or rebound tenderness. Neuro: Alert and oriented x 3.  Grossly intact.  Psych: Alert and cooperative, normal mood and affect.   Imaging Studies: MR Abdomen W Wo Contrast Result Date: 04/14/2024 CLINICAL DATA:  Hepatocellular carcinoma EXAM: MRI ABDOMEN WITHOUT AND WITH CONTRAST TECHNIQUE: Multiplanar multisequence MR imaging of the abdomen was performed both before and after the administration of intravenous contrast. CONTRAST:   8ml vueway , 2 ml wasted COMPARISON:  MRI abdomen January 01, 2020, CT abdomen pelvis December 29, 2023. FINDINGS: Lower chest: No acute findings. Hepatobiliary: There is a right hepatic lobe segment 4A/8 T2 hyperintense lesion measuring 3.2 cm with arterial post-contrast hyperenhancement, rapid washout on venous phase and capsular rim enhancement and threshold growth (LR 5) This lesion was previously measured stable 2.4 cm on prior CT. Additional scattered liver nonenhancing cysts (LR 1/2). Gallbladder is unremarkable. Pancreas: No mass, inflammatory changes, or other parenchymal abnormality identified. Spleen:  Within normal limits in size and appearance. Adrenals/Urinary Tract: No masses identified. No evidence of hydronephrosis. Stomach/Bowel: Visualized portions within the abdomen are unremarkable. Vascular/Lymphatic: No pathologically enlarged lymph nodes identified. No abdominal aortic aneurysm demonstrated. Other:  None. Musculoskeletal: No suspicious bone lesions identified. IMPRESSION: Suspicious liver observation consistent with hepatocellular carcinoma until proven otherwise. (LI-RADS 5). Recommend tissue sampling. No other suspicious  findings to suggest metastatic disease within the field of view. Scattered liver cysts. Electronically Signed   By: Megan  Zare M.D.   On: 04/14/2024 12:07   DG Chest 2 View Result Date: 03/29/2024 CLINICAL DATA:  Productive cough for 1 week EXAM: CHEST - 2 VIEW COMPARISON:  02/09/2024 FINDINGS: Stable cardiomediastinal silhouette. No focal consolidation, pleural effusion, or pneumothorax. No displaced rib fractures. IMPRESSION: No active cardiopulmonary disease. Electronically Signed   By: Norman Gatlin M.D.   On: 03/29/2024 18:37    Labs: CBC    Component Value Date/Time   WBC 6.6 04/11/2024 0348   RBC 4.39 04/11/2024 0348   HGB 14.2 04/11/2024 0348   HGB 14.2 10/26/2021 1001   HCT 42.0 04/11/2024 0348   HCT 41.7 10/26/2021 1001   PLT 183 04/11/2024 0348   PLT  223 10/26/2021 1001   MCV 95.7 04/11/2024 0348   MCV 95 10/26/2021 1001   MCH 32.3 04/11/2024 0348   MCHC 33.8 04/11/2024 0348   RDW 12.3 04/11/2024 0348   RDW 11.9 10/26/2021 1001   LYMPHSABS 1.5 04/11/2024 0348   LYMPHSABS 1.2 10/26/2021 1001   MONOABS 0.6 04/11/2024 0348   EOSABS 0.2 04/11/2024 0348   EOSABS 0.1 10/26/2021 1001   BASOSABS 0.0 04/11/2024 0348   BASOSABS 0.0 10/26/2021 1001    CMP     Component Value Date/Time   NA 137 04/11/2024 0348   NA 143 08/20/2023 1539   K 4.3 04/11/2024 0348   CL 104 04/11/2024 0348   CO2 24 04/11/2024 0348   GLUCOSE 91 04/11/2024 0348   BUN 16 04/11/2024 0348   BUN 14 08/20/2023 1539   CREATININE 0.89 04/11/2024 0348   CREATININE 0.93 04/08/2017 1642   CALCIUM  8.8 (L) 04/11/2024 0348   PROT 7.3 04/11/2024 0348   PROT 7.4 08/20/2023 1539   ALBUMIN 3.7 04/11/2024 0348   ALBUMIN 4.4 08/20/2023 1539   AST 22 04/11/2024 0348   ALT 21 04/11/2024 0348   ALKPHOS 134 (H) 04/11/2024 0348   BILITOT 1.1 04/11/2024 0348   BILITOT 0.2 08/20/2023 1539   GFRNONAA >60 04/11/2024 0348   GFRNONAA 86 04/08/2017 1642   GFRAA >60 07/25/2020 2138   GFRAA >89 04/08/2017 1642       Assessment and Plan:   READE TREFZ is a 73 y.o. y/o male presents for:  1.  Enlarging right liver mass 3.2 cm on abdominal MRI; evaluate for hepatocellular carcinoma. - Lab: PT/INR, Hepatic Panal, AFP, CEA, CA 19-9 - Scheduling liver biopsy  2.  Primary biliary cirrhosis - Continue ursodiol  500 Mg twice daily.  3.  Short segment Barrett's without dysplasia. - 3-year repeat EGD (due 11/2024). - Continue pantoprazole  40 Mg once daily.  4.  History of adenomatous colon polyps - 3-year repeat colonoscopy (due 11/2024).  Joshua Console, PA-C  Follow up ***

## 2024-04-20 ENCOUNTER — Ambulatory Visit: Admitting: Physician Assistant

## 2024-04-22 ENCOUNTER — Telehealth: Payer: Self-pay | Admitting: Internal Medicine

## 2024-04-22 NOTE — Telephone Encounter (Signed)
 Please call patient's nephew and let him know that [facility] gastroenterology has been trying to reach them to schedule the appointment for him to see the specialist regarding the mass in his liver.  They need to follow-up on this immediately.  Please give them the phone number to call: [facility] [address] [place], [place] [address] PH# [phone]

## 2024-04-22 NOTE — Telephone Encounter (Signed)
 Called & spoke to the patient's nephew. Gastroenterologist's office number and information given. Informed him to make appointment to see the specialists regarding the mass in his liver. Nephew expressed verbal understanding and will be calling immediately to schedule an appointment. No further assistance needed at this time.

## 2024-04-28 ENCOUNTER — Ambulatory Visit: Admitting: Physician Assistant

## 2024-05-14 ENCOUNTER — Other Ambulatory Visit (INDEPENDENT_AMBULATORY_CARE_PROVIDER_SITE_OTHER)

## 2024-05-14 ENCOUNTER — Ambulatory Visit (INDEPENDENT_AMBULATORY_CARE_PROVIDER_SITE_OTHER): Admitting: Physician Assistant

## 2024-05-14 VITALS — BP 120/60 | Ht 68.0 in | Wt 173.0 lb

## 2024-05-14 DIAGNOSIS — K2270 Barrett's esophagus without dysplasia: Secondary | ICD-10-CM | POA: Diagnosis not present

## 2024-05-14 DIAGNOSIS — K743 Primary biliary cirrhosis: Secondary | ICD-10-CM

## 2024-05-14 DIAGNOSIS — R160 Hepatomegaly, not elsewhere classified: Secondary | ICD-10-CM

## 2024-05-14 DIAGNOSIS — Z8619 Personal history of other infectious and parasitic diseases: Secondary | ICD-10-CM | POA: Diagnosis not present

## 2024-05-14 DIAGNOSIS — Z860101 Personal history of adenomatous and serrated colon polyps: Secondary | ICD-10-CM

## 2024-05-14 DIAGNOSIS — Z860100 Personal history of colon polyps, unspecified: Secondary | ICD-10-CM

## 2024-05-14 LAB — CBC WITH DIFFERENTIAL/PLATELET
Basophils Absolute: 0 K/uL (ref 0.0–0.1)
Basophils Relative: 0.2 % (ref 0.0–3.0)
Eosinophils Absolute: 0.1 K/uL (ref 0.0–0.7)
Eosinophils Relative: 1.9 % (ref 0.0–5.0)
HCT: 43.4 % (ref 39.0–52.0)
Hemoglobin: 14.7 g/dL (ref 13.0–17.0)
Lymphocytes Relative: 23.8 % (ref 12.0–46.0)
Lymphs Abs: 1.2 K/uL (ref 0.7–4.0)
MCHC: 34 g/dL (ref 30.0–36.0)
MCV: 95 fl (ref 78.0–100.0)
Monocytes Absolute: 0.5 K/uL (ref 0.1–1.0)
Monocytes Relative: 9.8 % (ref 3.0–12.0)
Neutro Abs: 3.3 K/uL (ref 1.4–7.7)
Neutrophils Relative %: 64.3 % (ref 43.0–77.0)
Platelets: 188 K/uL (ref 150.0–400.0)
RBC: 4.56 Mil/uL (ref 4.22–5.81)
RDW: 12.9 % (ref 11.5–15.5)
WBC: 5.1 K/uL (ref 4.0–10.5)

## 2024-05-14 LAB — COMPREHENSIVE METABOLIC PANEL WITH GFR
ALT: 15 U/L (ref 0–53)
AST: 13 U/L (ref 0–37)
Albumin: 4.3 g/dL (ref 3.5–5.2)
Alkaline Phosphatase: 163 U/L — ABNORMAL HIGH (ref 39–117)
BUN: 13 mg/dL (ref 6–23)
CO2: 31 meq/L (ref 19–32)
Calcium: 9.3 mg/dL (ref 8.4–10.5)
Chloride: 105 meq/L (ref 96–112)
Creatinine, Ser: 1.01 mg/dL (ref 0.40–1.50)
GFR: 74.8 mL/min (ref >60.00)
Glucose, Bld: 99 mg/dL (ref 70–99)
Potassium: 4.6 meq/L (ref 3.5–5.1)
Sodium: 141 meq/L (ref 135–145)
Total Bilirubin: 0.6 mg/dL (ref 0.2–1.2)
Total Protein: 7.4 g/dL (ref 6.0–8.3)

## 2024-05-14 LAB — PROTIME-INR
INR: 1.2 ratio — ABNORMAL HIGH (ref 0.8–1.0)
Prothrombin Time: 12.4 s (ref 9.6–13.1)

## 2024-05-14 MED ORDER — URSODIOL 500 MG PO TABS: 500.0000 mg | ORAL_TABLET | Freq: Two times a day (BID) | ORAL | 3 refills | Status: DC

## 2024-05-14 NOTE — Patient Instructions (Addendum)
 Your provider has requested that you go to the basement level for lab work before leaving today. Press B on the elevator. The lab is located at the first door on the left as you exit the elevator.  We have sent the following medications to your pharmacy for you to pick up at your convenience: Uroso 500 mg twice daily.  Radiology will call to schedule a Liver biopsy. Please call the office if you are not contacted by radiology.  _______________________________________________________  If your blood pressure at your visit was 140/90 or greater, please contact your primary care physician to follow up on this.  _______________________________________________________  If you are age 71 or older, your body mass index should be between 23-30. Your Body mass index is 26.3 kg/m. If this is out of the aforementioned range listed, please consider follow up with your Primary Care Provider.  If you are age 71 or younger, your body mass index should be between 19-25. Your Body mass index is 26.3 kg/m. If this is out of the aformentioned range listed, please consider follow up with your Primary Care Provider.   ________________________________________________________  The [facility] providers would like to encourage you to use MYCHART to communicate with providers for non-urgent requests or questions.  Due to long hold times on the telephone, sending your provider a message by [facility] may be a faster and more efficient way to get a response.  Please allow 48 business hours for a response.  Please remember that this is for non-urgent requests.  _______________________________________________________  [facility] is using a team-based approach to care.  Your team is made up of your doctor and two to three APPS. Our APPS (Nurse Practitioners and Physician Assistants) work with your physician to ensure care continuity for you. They are fully qualified to address your health concerns and develop a  treatment plan. They communicate directly with your gastroenterologist to care for you. Seeing the Advanced Practice Practitioners on your physician's team can help you by facilitating care more promptly, often allowing for earlier appointments, access to diagnostic testing, procedures, and other specialty referrals.    It was a pleasure to see you today!  Thank you for trusting me with your gastrointestinal care!

## 2024-05-14 NOTE — Progress Notes (Signed)
 [name], PA-C [address] [place], [place]  [address] Phone: [phone]   Primary Care Physician: Vicci Barnie NOVAK, MD  Primary Gastroenterologist:  [name], PA-C / Dr. Gordy Starch   Chief Complaint: Liver Mass, F/U primary biliary cirrhosis.  Refill ursodiol .       HPI:   [name] is a 73 y.o. male, established patient of Dr. Starch, presents for follow-up of primary biliary cholangitis.  Needs refill of ursodiol .  Last saw [name] in our office 10/2021.  Was doing well on ursodiol  500 Mg twice daily.  Stable alk phos levels and normal AST/ALT.  History of hepatitis C treated successfully with Harvoni .  Sustained virologic response.  History of seizure disorder currently on Keppra  and Dilantin .  Here today with his Nephew [name]).  Mr. Allsup (the patient) has some dementia with intermittent confusion.  He lives with his nephew [name]) who helps with his care.  [name] is providing patient's history.  Patient is nonverbal.  Lorella states patient has not taken any ursodiol  in over a year.  He is out of medication.  [name] was unaware of patient's diagnosis of primary biliary cholangitis and that he needed to stay on ursodiol .  [name] states patient used to drink a lot of alcohol in the past, but none in many years.  No GI symptoms have been noticed.  Patient has had a few small seizures recently with recent ED evaluation.  04/11/2024 last labs: Stable mild elevated alk phos 134.  Normal AST 22, ALT 21, T. bili 1.1.  Normal BMP and CBC.  Hgb 14.2, platelet 183.  04/10/2024 last abdominal imaging: Abdominal MRI with and without contrast: **Suspicious enlarging 3.2 cm liver lesion concerning for hepatocellular carcinoma.  Additional scattered liver cyst.  No cirrhosis.  MRI otherwise unremarkable.  11/2021 last colonoscopy (for surveillance of colon polyps): 3 small (4 mm to 6 mm) tubular adenoma polyps removed.  Medium internal hemorrhoids.  Excellent prep.   Repeat colonoscopy in 3 years (11/2024).  11/2021 last EGD (to screen for esophageal varices): 3 cm hiatal hernia.  Esophageal mucosal changes suggestive of short segment Barrett's.  Gastritis.  No evidence of esophageal or gastric varices.  No portal gastropathy.  Biopsies showed Barrett's esophagus with no dysplasia.  He was started on pantoprazole  40 Mg daily.  Biopsies negative for H. pylori.  Repeat EGD in 3 years (11/2024).   Current Outpatient Medications  Medication Sig Dispense Refill   aspirin  EC 81 MG tablet Take 1 tablet (81 mg total) by mouth daily. 100 tablet 0   lacosamide  150 MG TABS Take 1 tablet (150 mg total) by mouth 2 (two) times daily. 60 tablet 4   levETIRAcetam  (KEPPRA ) 750 MG tablet Take 2 tablets (1,500 mg total) by mouth 2 (two) times daily. (Patient taking differently: Take 1,500 mg by mouth See admin instructions. Take 1,500 mg by mouth in the morning and evening) 360 tablet 1   loratadine  (CLARITIN ) 10 MG tablet TAKE 1 TABLET BY MOUTH EVERY DAY 90 tablet 1   memantine  (NAMENDA ) 10 MG tablet Take 1 tablet (10 mg total) by mouth 2 (two) times daily. 180 tablet 0   ondansetron  (ZOFRAN ) 4 MG tablet Take 1 tablet (4 mg total) by mouth every 6 (six) hours as needed for nausea. 20 tablet 0   pantoprazole  (PROTONIX ) 40 MG tablet Take 1 tablet (40 mg total) by mouth daily. 90 tablet 0   phenytoin  (DILANTIN ) 100 MG ER  capsule Take 2 capsules (200 mg total) by mouth at bedtime. FOR SEIZURES 180 capsule 2   PHENYTOIN  INFATABS 50 MG tablet Chew 0.5 tablets (25 mg total) by mouth at bedtime. 45 tablet 1   rosuvastatin  (CRESTOR ) 10 MG tablet Take 1 tablet (10 mg total) by mouth daily. STOP Simvastatin  90 tablet 3   ursodiol  (ACTIGALL ) 500 MG tablet Take 1 tablet (500 mg total) by mouth 2 (two) times daily. 180 tablet 3   No current facility-administered medications for this visit.    Allergies as of 05/14/2024   (No Known Allergies)    Past Medical History:  Diagnosis Date    Alcoholism (HCC)    History of, not active   Chronic hepatitis C (HCC)    Dementia (HCC)    Diverticulosis    Gait disorder    Gingival hypertrophy    Secondary to Dilantin    Hemiparesis and alteration of sensations as late effects of stroke (HCC) 01/25/2017   Left hemiparesis   Hiatal hernia    History of alcoholism (HCC)    Hx of ischemic vertebrobasilar artery thalamic stroke    Right   Internal hemorrhoids    Memory disorder 03/04/2013   Primary biliary cirrhosis (HCC)    Seizures (HCC)    Stroke (HCC)    Right frontal, right thalamic   Tubular adenoma of colon    Ulnar neuropathy of left upper extremity     Past Surgical History:  Procedure Laterality Date   COLONOSCOPY WITH PROPOFOL  N/A 07/10/2017   Procedure: COLONOSCOPY WITH PROPOFOL ;  Surgeon: Elicia Claw, MD;  Location: [facility];  Service: Gastroenterology;  Laterality: N/A;   ESOPHAGOGASTRODUODENOSCOPY (EGD) WITH PROPOFOL  N/A 07/10/2017   Procedure: ESOPHAGOGASTRODUODENOSCOPY (EGD) WITH PROPOFOL ;  Surgeon: Elicia Claw, MD;  Location: [facility];  Service: Gastroenterology;  Laterality: N/A;   HIP ARTHROPLASTY Left 11/26/2013   Procedure: LEFT HIP HEMIARTHROPLASTY;  Surgeon: [name], MD;  Location: [facility];  Service: Orthopedics;  Laterality: Left;   WRIST SURGERY Left 95 & 96    Review of Systems:    All systems reviewed and negative except where noted in HPI.    Physical Exam:  BP 120/60   Ht 5' 8 (1.727 m)   Wt 173 lb (78.5 kg)   BMI 26.30 kg/m  No LMP for male patient.  General: Feeble, elderly male, in no acute distress.  Walks with a cane. Lungs: Clear to auscultation bilaterally. Non-labored. Heart: Regular rate and rhythm, no murmurs rubs or gallops.  Abdomen: Bowel sounds are normal; Abdomen is Softand obese; No hepatosplenomegaly, masses or hernias;  No Abdominal Tenderness; No guarding or rebound tenderness. Neuro: Grossly intact. Walks with a Medical laboratory scientific officer.  Non-Verbal.    Psych: Alert and cooperative, normal mood and affect.   Imaging Studies: No results found.  Labs: CBC    Component Value Date/Time   WBC 5.1 05/14/2024 1208   RBC 4.56 05/14/2024 1208   HGB 14.7 05/14/2024 1208   HGB 14.2 10/26/2021 1001   HCT 43.4 05/14/2024 1208   HCT 41.7 10/26/2021 1001   PLT 188.0 05/14/2024 1208   PLT 223 10/26/2021 1001   MCV 95.0 05/14/2024 1208   MCV 95 10/26/2021 1001   MCH 32.3 04/11/2024 0348   MCHC 34.0 05/14/2024 1208   RDW 12.9 05/14/2024 1208   RDW 11.9 10/26/2021 1001   LYMPHSABS 1.2 05/14/2024 1208   LYMPHSABS 1.2 10/26/2021 1001   MONOABS 0.5 05/14/2024 1208   EOSABS 0.1 05/14/2024 1208  EOSABS 0.1 10/26/2021 1001   BASOSABS 0.0 05/14/2024 1208   BASOSABS 0.0 10/26/2021 1001    CMP     Component Value Date/Time   NA 141 05/14/2024 1208   NA 143 08/20/2023 1539   K 4.6 05/14/2024 1208   CL 105 05/14/2024 1208   CO2 31 05/14/2024 1208   GLUCOSE 99 05/14/2024 1208   BUN 13 05/14/2024 1208   BUN 14 08/20/2023 1539   CREATININE 1.01 05/14/2024 1208   CREATININE 0.93 04/08/2017 1642   CALCIUM  9.3 05/14/2024 1208   PROT 7.4 05/14/2024 1208   PROT 7.4 08/20/2023 1539   ALBUMIN 4.3 05/14/2024 1208   ALBUMIN 4.4 08/20/2023 1539   AST 13 05/14/2024 1208   ALT 15 05/14/2024 1208   ALKPHOS 163 (H) 05/14/2024 1208   BILITOT 0.6 05/14/2024 1208   BILITOT 0.2 08/20/2023 1539   GFRNONAA >60 04/11/2024 0348   GFRNONAA 86 04/08/2017 1642   GFRAA >60 07/25/2020 2138   GFRAA >89 04/08/2017 1642       Assessment and Plan:   MARJORIE LUSSIER is a 73 y.o. y/o male returns for follow-up of:  1.  Liver mass, concerning for hepatocellular carcinoma - Schedule Liver biopsy - Labs: AFP, CEA, CA 19-9  2.  Primary biliary cirrhosis; Has been off Ursodiol  > 1 year. - Re-Start ursodiol  500 Mg twice daily, #180, 3 refills - Labs: CBC, CMP, PT-INR - I gave patient education handout to his Nephew. - Discussed importance of staying on  Ursodiol .  3.  History of hepatitis C treated successfully with Harvoni .  Sustained virologic response.  - No further treatment needed  4.  Barrett's esophagus - 3-year repeat EGD will be due 11/2024  5.  History of adenomatous colon polyps - 3-year repeat colonoscopy will be due 11/2024  6.  Comorbidities: History of seizure disorder. - Followup with Neurologist.  [name], PA-C  Follow up with Dr. Albertus in 3 months.  Also f/u based on test results.

## 2024-05-19 ENCOUNTER — Encounter (HOSPITAL_COMMUNITY): Payer: Self-pay

## 2024-05-19 NOTE — Progress Notes (Signed)
 Joshua Sieving, MD  [name]; [name] PROCEDURE / BIOPSY REVIEW Date: 05/19/24  Requested Biopsy site: R liver lesion Reason for request: r/o hcca Imaging review: Best seen on MR 04/08/24 14:14  Decision: Approved Imaging modality to perform: Ultrasound Schedule with: Moderate Sedation Schedule for: Any VIR  Additional comments:   Please contact me with questions, concerns, or if issue pertaining to this request arise.  [name] Johann, MD [facility]       Previous Messages    ----- Message ----- From: [name] Sent: 05/19/2024   9:08 AM EDT To: [name]; Ir Procedure Requests Subject: Us   biopsy ( liver)                            Procedure : US  liver biopsy   Reason : Liver mass Dx: Liver mass [R16.0 (ICD-10-CM)]; Primary biliary cirrhosis (HCC) [K74.3 (ICD-10-CM)]    History :  MR abd w/wo , CT abdn pelvis w/  Provider: [name], PA-C  Contact : [phone]

## 2024-05-20 ENCOUNTER — Ambulatory Visit: Payer: Self-pay | Admitting: Physician Assistant

## 2024-05-20 DIAGNOSIS — K743 Primary biliary cirrhosis: Secondary | ICD-10-CM

## 2024-05-20 DIAGNOSIS — R160 Hepatomegaly, not elsewhere classified: Secondary | ICD-10-CM

## 2024-05-20 LAB — CANCER ANTIGEN 19-9: CA 19-9: 19 U/mL (ref <34)

## 2024-05-20 LAB — CEA: CEA: <2 ng/mL

## 2024-05-20 LAB — AFP TUMOR MARKER: AFP-Tumor Marker: 1.5 ng/mL (ref <6.1)

## 2024-05-21 NOTE — Progress Notes (Signed)
 Patient has dementia.  Need to talk to caregiver on HIPAA form. Call and notify patient and his caregiver labs show: 1.  CA 19-9, CEA, and AFP tumor markers are all negative.  No evidence of pancreatic, colon, or liver cancer based on these lab results. 2.  Normal CBC.  White count and hemoglobin are normal.  No evidence of infection or anemia.  3.  Alkaline phosphatase is a little more elevated, which is consistent with his history of primary biliary cholangitis.  All other liver tests are normal.  This labs should improve after patient has restarted ursodiol  500 Mg twice daily, which was recently prescribed at office visit. 4.  Kidney test, glucose, and electrolytes are normal. 5.  PT/INR are stable, unchanged. 6.  Continue with plan for liver biopsy to evaluate liver mass as scheduled.  Keep follow-up appointment with Dr. Albertus in 3 months. [name], PA-C  CC: Dr. Albertus for [facility]

## 2024-05-29 NOTE — Telephone Encounter (Signed)
===  View-only below this line=== ----- Message ----- From: Albertus Gordy HERO, MD Sent: 05/29/2024   1:10 PM EDT To: [facility] Triage Subject: FW: New Referral for Liver                     FYI Dr. Lanny recommends not going straight to liver bx Thanks JMP ----- Message ----- From: Lanny Callander, MD Sent: 05/29/2024  11:58 AM EDT To: Leonor LITTIE Dawn, MD; Gordy HERO Albertus, MD; King* Subject: RE: New Referral for Liver                     Tully,   Do we have tumor board next week? If yes, please add him on.   Leonor, I reviewed his chart. He has PSC and liver cirrhosis, now has 3.2cm liver lesion in 4A/8, LI-RIDS-5, but AFP normal. It appears central to me, not sure if you will offer surgery, or transplant is a better options. I will hold of the biopsy, what do you think?  Gordy and [place], I may not be able to see him next week, but will get him in the week after, and will coordinate his care with surgeons. Let's see what Leonor says.  Thanks  Callander ----- Message ----- From: Golden Forestine BROCKS, RN Sent: 05/29/2024  11:11 AM EDT To: Callander Lanny, MD; Evalene LITTIE Rich, RN Subject: New Referral for Liver                         Dr. Lanny,   This is an urgent referral. Patient is scheduled for a bx on 8/14, referring office is hoping Onc. Will see patient prior to biopsy. Dr Pamula recommendation to have oncology see patient for their opinion on liver biopsy prior to moving forward with scheduled 06/04/24 procedure. Advised that we have placed an urgent referral to oncology so they can hopefully see patient before 8/14. However, if they are unable to get him in before that, we will move liver biopsy out until after their appointment.   04/08/24 MRI Abdomen IMPRESSION: Suspicious liver observation consistent with hepatocellular carcinoma until proven otherwise. (LI-RADS 5). Recommend tissue sampling.   No other suspicious findings to suggest metastatic disease within the field of view.   Scattered  liver cysts.   [facility] available. Please advise.  Thank you,  Colene

## 2024-05-29 NOTE — Telephone Encounter (Signed)
 I have spoken to [facility] to advise that oncology is unable to see [name] within the next few days but will be getting him in soon. They have reviewed the case though and do NOT feel he needs to move forward straight to liver biopsy yet. Advised that we will cancel previously scheduled liver biopsy for 06/04/24 and defer to oncology recommendation. [name] verbalizes understanding.

## 2024-05-31 ENCOUNTER — Observation Stay (HOSPITAL_COMMUNITY)
Admission: EM | Admit: 2024-05-31 | Discharge: 2024-06-02 | Disposition: A | Discharge status: Home / Self Care | Payer: Medicare (Managed Care) | Attending: Internal Medicine | Admitting: Internal Medicine

## 2024-05-31 ENCOUNTER — Emergency Department (HOSPITAL_COMMUNITY)
Admission: EM | Admit: 2024-05-31 | Discharge: 2024-05-31 | Disposition: A | Discharge status: Home / Self Care | Payer: Medicare (Managed Care) | Source: Home / Self Care | Attending: Emergency Medicine | Admitting: Emergency Medicine

## 2024-05-31 ENCOUNTER — Emergency Department (HOSPITAL_COMMUNITY): Payer: Medicare (Managed Care)

## 2024-05-31 ENCOUNTER — Encounter (HOSPITAL_COMMUNITY): Payer: Self-pay

## 2024-05-31 ENCOUNTER — Other Ambulatory Visit: Payer: Self-pay

## 2024-05-31 DIAGNOSIS — R569 Unspecified convulsions: Secondary | ICD-10-CM | POA: Insufficient documentation

## 2024-05-31 DIAGNOSIS — Z8673 Personal history of transient ischemic attack (TIA), and cerebral infarction without residual deficits: Secondary | ICD-10-CM | POA: Diagnosis not present

## 2024-05-31 DIAGNOSIS — M25571 Pain in right ankle and joints of right foot: Secondary | ICD-10-CM | POA: Diagnosis not present

## 2024-05-31 DIAGNOSIS — G9341 Metabolic encephalopathy: Secondary | ICD-10-CM

## 2024-05-31 DIAGNOSIS — G40909 Epilepsy, unspecified, not intractable, without status epilepticus: Principal | ICD-10-CM | POA: Insufficient documentation

## 2024-05-31 DIAGNOSIS — W19XXXA Unspecified fall, initial encounter: Secondary | ICD-10-CM | POA: Diagnosis not present

## 2024-05-31 DIAGNOSIS — G4089 Other seizures: Secondary | ICD-10-CM | POA: Diagnosis present

## 2024-05-31 DIAGNOSIS — R748 Abnormal levels of other serum enzymes: Secondary | ICD-10-CM | POA: Insufficient documentation

## 2024-05-31 DIAGNOSIS — F0390 Unspecified dementia without behavioral disturbance: Secondary | ICD-10-CM | POA: Insufficient documentation

## 2024-05-31 DIAGNOSIS — Z79899 Other long term (current) drug therapy: Secondary | ICD-10-CM | POA: Diagnosis not present

## 2024-05-31 DIAGNOSIS — Z7982 Long term (current) use of aspirin: Secondary | ICD-10-CM | POA: Insufficient documentation

## 2024-05-31 DIAGNOSIS — S8264XA Nondisplaced fracture of lateral malleolus of right fibula, initial encounter for closed fracture: Secondary | ICD-10-CM

## 2024-05-31 DIAGNOSIS — G40919 Epilepsy, unspecified, intractable, without status epilepticus: Secondary | ICD-10-CM | POA: Diagnosis present

## 2024-05-31 DIAGNOSIS — S8261XA Displaced fracture of lateral malleolus of right fibula, initial encounter for closed fracture: Secondary | ICD-10-CM | POA: Insufficient documentation

## 2024-05-31 LAB — COMPREHENSIVE METABOLIC PANEL WITH GFR
ALT: 12 U/L (ref 0–44)
ALT: 14 U/L (ref 0–44)
AST: 16 U/L (ref 15–41)
AST: 18 U/L (ref 15–41)
Albumin: 3.8 g/dL (ref 3.5–5.0)
Albumin: 3.7 g/dL (ref 3.5–5.0)
Alkaline Phosphatase: 147 U/L — ABNORMAL HIGH (ref 38–126)
Alkaline Phosphatase: 146 U/L — ABNORMAL HIGH (ref 38–126)
Anion gap: 9 (ref 5–15)
Anion gap: 9 (ref 5–15)
BUN: 21 mg/dL (ref 8–23)
BUN: 15 mg/dL (ref 8–23)
CO2: 25 mmol/L (ref 22–32)
CO2: 23 mmol/L (ref 22–32)
Calcium: 9 mg/dL (ref 8.9–10.3)
Calcium: 9.1 mg/dL (ref 8.9–10.3)
Chloride: 103 mmol/L (ref 98–111)
Chloride: 103 mmol/L (ref 98–111)
Creatinine, Ser: 1.07 mg/dL (ref 0.61–1.24)
Creatinine, Ser: 1.03 mg/dL (ref 0.61–1.24)
GFR, Estimated: >60 mL/min (ref >60)
GFR, Estimated: >60 mL/min (ref >60)
Glucose, Bld: 98 mg/dL (ref 70–99)
Glucose, Bld: 110 mg/dL — ABNORMAL HIGH (ref 70–99)
Potassium: 4.5 mmol/L (ref 3.5–5.1)
Potassium: 4.2 mmol/L (ref 3.5–5.1)
Sodium: 137 mmol/L (ref 135–145)
Sodium: 135 mmol/L (ref 135–145)
Total Bilirubin: 0.4 mg/dL (ref 0.0–1.2)
Total Bilirubin: 0.7 mg/dL (ref 0.0–1.2)
Total Protein: 7.7 g/dL (ref 6.5–8.1)
Total Protein: 7.2 g/dL (ref 6.5–8.1)

## 2024-05-31 LAB — URINALYSIS, ROUTINE W REFLEX MICROSCOPIC
Bacteria, UA: NONE SEEN
Bilirubin Urine: NEGATIVE
Glucose, UA: NEGATIVE mg/dL
Hgb urine dipstick: NEGATIVE
Ketones, ur: NEGATIVE mg/dL
Leukocytes,Ua: NEGATIVE
Nitrite: NEGATIVE
Protein, ur: 30 mg/dL — AB
Specific Gravity, Urine: 1.018 (ref 1.005–1.030)
pH: 5 (ref 5.0–8.0)

## 2024-05-31 LAB — CBG MONITORING, ED
Glucose-Capillary: 104 mg/dL — ABNORMAL HIGH (ref 70–99)
Glucose-Capillary: 93 mg/dL (ref 70–99)

## 2024-05-31 LAB — CBC WITH DIFFERENTIAL/PLATELET
Abs Immature Granulocytes: 0.03 K/uL (ref 0.00–0.07)
Abs Immature Granulocytes: 0.03 K/uL (ref 0.00–0.07)
Basophils Absolute: 0 K/uL (ref 0.0–0.1)
Basophils Absolute: 0 K/uL (ref 0.0–0.1)
Basophils Relative: 0 %
Basophils Relative: 0 %
Eosinophils Absolute: 0.2 K/uL (ref 0.0–0.5)
Eosinophils Absolute: 0 K/uL (ref 0.0–0.5)
Eosinophils Relative: 2 %
Eosinophils Relative: 0 %
HCT: 40.8 % (ref 39.0–52.0)
HCT: 39.8 % (ref 39.0–52.0)
Hemoglobin: 13.9 g/dL (ref 13.0–17.0)
Hemoglobin: 13.6 g/dL (ref 13.0–17.0)
Immature Granulocytes: 1 %
Immature Granulocytes: 0 %
Lymphocytes Relative: 18 %
Lymphocytes Relative: 13 %
Lymphs Abs: 1.1 K/uL (ref 0.7–4.0)
Lymphs Abs: 1.4 K/uL (ref 0.7–4.0)
MCH: 31.9 pg (ref 26.0–34.0)
MCH: 31.9 pg (ref 26.0–34.0)
MCHC: 34.1 g/dL (ref 30.0–36.0)
MCHC: 34.2 g/dL (ref 30.0–36.0)
MCV: 93.6 fL (ref 80.0–100.0)
MCV: 93.2 fL (ref 80.0–100.0)
Monocytes Absolute: 0.6 K/uL (ref 0.1–1.0)
Monocytes Absolute: 1 K/uL (ref 0.1–1.0)
Monocytes Relative: 10 %
Monocytes Relative: 9 %
Neutro Abs: 4.3 K/uL (ref 1.7–7.7)
Neutro Abs: 8.3 K/uL — ABNORMAL HIGH (ref 1.7–7.7)
Neutrophils Relative %: 69 %
Neutrophils Relative %: 78 %
Platelets: 173 K/uL (ref 150–400)
Platelets: 168 K/uL (ref 150–400)
RBC: 4.36 MIL/uL (ref 4.22–5.81)
RBC: 4.27 MIL/uL (ref 4.22–5.81)
RDW: 12.2 % (ref 11.5–15.5)
RDW: 12.3 % (ref 11.5–15.5)
WBC: 6.2 K/uL (ref 4.0–10.5)
WBC: 10.7 K/uL — ABNORMAL HIGH (ref 4.0–10.5)
nRBC: 0 % (ref 0.0–0.2)
nRBC: 0 % (ref 0.0–0.2)

## 2024-05-31 LAB — I-STAT CG4 LACTIC ACID, ED: Lactic Acid, Venous: 0.8 mmol/L (ref 0.5–1.9)

## 2024-05-31 LAB — PHENYTOIN LEVEL, TOTAL: Phenytoin Lvl: 11.6 ug/mL (ref 10.0–20.0)

## 2024-05-31 MED ORDER — PHENYTOIN SODIUM EXTENDED 100 MG PO CAPS
200.0000 mg | ORAL_CAPSULE | Freq: Every day | ORAL | Status: DC
  Administered 2024-05-31 – 2024-06-01 (×3): 200 mg via ORAL
  Filled 2024-05-31 (×2): qty 2

## 2024-05-31 MED ORDER — LORAZEPAM 1 MG PO TABS
1.0000 mg | ORAL_TABLET | Freq: Once | ORAL | Status: AC
  Administered 2024-05-31: 1 mg via ORAL
  Filled 2024-05-31: qty 1

## 2024-05-31 MED ORDER — ACETAMINOPHEN 650 MG RE SUPP
650.0000 mg | Freq: Once | RECTAL | Status: AC
  Administered 2024-05-31: 650 mg via RECTAL
  Filled 2024-05-31: qty 1

## 2024-05-31 MED ORDER — PHENYTOIN 50 MG PO CHEW
25.0000 mg | CHEWABLE_TABLET | Freq: Every day | ORAL | Status: DC
Start: 2024-05-31 — End: 2024-06-02
  Administered 2024-05-31 – 2024-06-01 (×3): 25 mg via ORAL
  Filled 2024-05-31 (×3): qty 0.5

## 2024-05-31 MED ORDER — LORAZEPAM 2 MG/ML IJ SOLN
1.0000 mg | Freq: Once | INTRAMUSCULAR | Status: AC
  Administered 2024-05-31: 1 mg via INTRAVENOUS
  Filled 2024-05-31: qty 1

## 2024-05-31 MED ORDER — ASPIRIN 81 MG PO TBEC
81.0000 mg | DELAYED_RELEASE_TABLET | Freq: Every day | ORAL | Status: DC
  Administered 2024-06-01 – 2024-06-02 (×4): 81 mg via ORAL
  Filled 2024-05-31 (×2): qty 1

## 2024-05-31 MED ORDER — MEMANTINE HCL 10 MG PO TABS
10.0000 mg | ORAL_TABLET | Freq: Two times a day (BID) | ORAL | Status: DC
  Administered 2024-05-31 – 2024-06-02 (×7): 10 mg via ORAL
  Filled 2024-05-31: qty 2
  Filled 2024-05-31 (×3): qty 1

## 2024-05-31 MED ORDER — LEVETIRACETAM (KEPPRA) 500 MG/5 ML ADULT IV PUSH
1500.0000 mg | Freq: Once | INTRAVENOUS | Status: AC
  Administered 2024-05-31: 1500 mg via INTRAVENOUS
  Filled 2024-05-31: qty 15

## 2024-05-31 NOTE — ED Notes (Signed)
 Pt reports that brief is wet. Clean brief applied.

## 2024-05-31 NOTE — ED Triage Notes (Signed)
 PT BIB [facility]. Per EMS PT had a seizure. PT has history of Seizures is compliant with medication per EMS he has breakthrough seizures every few months.

## 2024-05-31 NOTE — ED Triage Notes (Signed)
 Pt to ED by EMS from home with c/o a witnessed seizure reported to be status epilepticus. Pt was Dc'd from here this morning for the same. Pt received 5mg  versed  IM and 400mls LR by EMS. Pt arrives confused, responsive to verbal command. VSS.

## 2024-05-31 NOTE — ED Provider Notes (Signed)
  EMERGENCY DEPARTMENT AT [facility] Provider Note   CSN: [id number] Arrival date & time: 05/31/24  1719     Patient presents with: Seizures   [name] is a 73 y.o. male with past medical history of seizures, sepsis, vascular dementia presents emergency department via EMS for evaluation of 2 witnessed seizures by family following DC this morning. Both seizures lasted 20 minutes each and had 2 hours in between seizures where he was at baseline. Both seizures had tonic clonic activity. Patient typically yells prior to seizure to alert family, he is about have a seizure. Was actively seizing upon EMS arrival.  Received Versed  5mg  IM and 1 L LR bolus PTA. Patient's baseline is A&Ox1 alert to himself but is able to have full conversations. No known trigger for seizures  Was evaluated this morning for seizure with reassuring labs.  Typically has 1 breakthrough seizure each month.  Family reports compliance with seizure medications.  Takes Keppra , Dilantin  for seizures and is complaint as nephew manages medications.  Dilantin  level from this morning was WNL.  Keppra  level pending.    Seizures      Prior to Admission medications   Medication Sig Start Date End Date Taking? Authorizing Provider  aspirin  EC 81 MG tablet Take 1 tablet (81 mg total) by mouth daily. 03/04/20  Yes Vicci Barnie NOVAK, MD  memantine  (NAMENDA ) 10 MG tablet Take 1 tablet (10 mg total) by mouth 2 (two) times daily. 02/19/24  Yes Vicci Barnie NOVAK, MD  phenytoin  (DILANTIN ) 100 MG ER capsule Take 2 capsules (200 mg total) by mouth at bedtime. FOR SEIZURES 02/19/24  Yes Vicci Barnie NOVAK, MD  PHENYTOIN  INFATABS 50 MG tablet Chew 0.5 tablets (25 mg total) by mouth at bedtime. 02/19/24  Yes Vicci Barnie NOVAK, MD    Allergies: Patient has no known allergies.    Review of Systems  Neurological:  Positive for seizures.    Updated Vital Signs BP (!) 142/82   Pulse 92   Temp 98.4 F (36.9 C)  (Oral)   Resp 19   Ht 5' 8 (1.727 m)   Wt 78.5 kg   SpO2 100%   BMI 26.31 kg/m   Physical Exam Vitals and nursing note reviewed.  Constitutional:      General: He is not in acute distress.    Appearance: Normal appearance. He is not diaphoretic.  HENT:     Head: Normocephalic and atraumatic.     Comments: No hematoma nor TTP of cranium No crepitus to facial bones    Right Ear: External ear normal. No hemotympanum.     Left Ear: External ear normal. No hemotympanum.     Nose: Nose normal.     Right Nostril: No epistaxis or septal hematoma.     Left Nostril: No epistaxis or septal hematoma.     Mouth/Throat:     Mouth: Mucous membranes are moist. No injury or lacerations.  Eyes:     General:        Right eye: No discharge.        Left eye: No discharge.     Extraocular Movements: Extraocular movements intact.     Conjunctiva/sclera: Conjunctivae normal.     Pupils: Pupils are equal, round, and reactive to light.     Comments: No subconjunctival hemorrhage, hyphema, tear drop pupil, or fluid leakage bilaterally  Neck:     Vascular: No carotid bruit.  Cardiovascular:     Rate and Rhythm: Normal rate.  Pulses: Normal pulses.          Radial pulses are 2+ on the right side and 2+ on the left side.       Dorsalis pedis pulses are 2+ on the right side and 2+ on the left side.  Pulmonary:     Effort: Pulmonary effort is normal. No respiratory distress.     Breath sounds: Normal breath sounds. No wheezing.  Chest:     Chest wall: No tenderness.  Abdominal:     General: Bowel sounds are normal. There is no distension.     Palpations: Abdomen is soft.     Tenderness: There is no abdominal tenderness. There is no guarding or rebound.  Musculoskeletal:     Cervical back: Full passive range of motion without pain, normal range of motion and neck supple. No deformity, rigidity or bony tenderness. Normal range of motion.     Thoracic back: No deformity or bony tenderness. Normal  range of motion.     Lumbar back: No deformity or bony tenderness. Normal range of motion.     Right hip: No bony tenderness or crepitus.     Left hip: No bony tenderness or crepitus.     Right lower leg: No edema.     Left lower leg: No edema.     Comments: No obvious deformity to joints or long bones Pelvis stable with no shortening or rotation of LE bilaterally  Skin:    General: Skin is warm and dry.     Capillary Refill: Capillary refill takes less than 2 seconds.  Neurological:     General: No focal deficit present.     Mental Status: He is alert and oriented to person, place, and time. Mental status is at baseline.     GCS: GCS eye subscore is 4. GCS verbal subscore is 4. GCS motor subscore is 6.     Deep Tendon Reflexes: Reflexes are normal and symmetric. Reflexes normal.     Comments: Neuro exam is extremely limited 2/2 dementia. Occasionally follow commands. Moving all extremities WNL. GCS 13-14     (all labs ordered are listed, but only abnormal results are displayed) Labs Reviewed  URINALYSIS, ROUTINE W REFLEX MICROSCOPIC - Abnormal; Notable for the following components:      Result Value   APPearance HAZY (*)    Protein, ur 30 (*)    All other components within normal limits  COMPREHENSIVE METABOLIC PANEL WITH GFR - Abnormal; Notable for the following components:   Glucose, Bld 110 (*)    Alkaline Phosphatase 146 (*)    All other components within normal limits  CBC WITH DIFFERENTIAL/PLATELET - Abnormal; Notable for the following components:   WBC 10.7 (*)    Neutro Abs 8.3 (*)    All other components within normal limits  CBC WITH DIFFERENTIAL/PLATELET  CBG MONITORING, ED  I-STAT CG4 LACTIC ACID, ED  I-STAT CG4 LACTIC ACID, ED    EKG: EKG Interpretation Date/Time:  Sunday May 31 2024 17:32:21 EDT Ventricular Rate:  92 PR Interval:  203 QRS Duration:  89 QT Interval:  338 QTC Calculation: 419 R Axis:   67  Text Interpretation: Sinus rhythm Confirmed  by Randol Simmonds [phone]) on 05/31/2024 6:23:56 PM  Radiology: CT Head Wo Contrast Result Date: 05/31/2024 CLINICAL DATA:  Head trauma, moderate-severe; Neck trauma (Age >= 65y) EXAM: CT HEAD WITHOUT CONTRAST CT CERVICAL SPINE WITHOUT CONTRAST TECHNIQUE: Multidetector CT imaging of the head and cervical spine was performed following the  standard protocol without intravenous contrast. Multiplanar CT image reconstructions of the cervical spine were also generated. RADIATION DOSE REDUCTION: This exam was performed according to the departmental dose-optimization program which includes automated exposure control, adjustment of the mA and/or kV according to patient size and/or use of iterative reconstruction technique. COMPARISON:  CT head 05/07/2010 FINDINGS: CT HEAD FINDINGS Brain: Prominence of the lateral ventricles may be related to central predominant atrophy, although a component of normal pressure/communicating hydrocephalus cannot be excluded. Similar-appearing right parietooccipital lobe encephalomalacia. No evidence of large-territorial acute infarction. No parenchymal hemorrhage. No mass lesion. No extra-axial collection. No mass effect or midline shift. No hydrocephalus. Basilar cisterns are patent. Vascular: No hyperdense vessel. Skull: No acute fracture or focal lesion. Sinuses/Orbits: Paranasal sinuses and mastoid air cells are clear. The orbits are unremarkable. Other: None. CT CERVICAL SPINE FINDINGS Alignment: Normal. Skull base and vertebrae: Multilevel moderate degenerative changes of the spine. No associated severe osseous neural foraminal or central canal stenosis. No acute fracture. No aggressive appearing focal osseous lesion or focal pathologic process. Soft tissues and spinal canal: No prevertebral fluid or swelling. No visible canal hematoma. Upper chest: Right apical 9 x 8 mm pulmonary nodule. Biapical centrilobular and paraseptal emphysematous changes. Other: None. IMPRESSION: 1. No acute  intracranial abnormality. 2. No acute displaced fracture or traumatic listhesis of the cervical spine. 3. Right apical 9 x 8 mm pulmonary nodule. Recommend outpatient CT chest for further evaluation. 4.  Emphysema (ICD10-J43.9). Electronically Signed   By: Morgane  Naveau M.D.   On: 05/31/2024 19:40   CT Cervical Spine Wo Contrast Result Date: 05/31/2024 CLINICAL DATA:  Head trauma, moderate-severe; Neck trauma (Age >= 65y) EXAM: CT HEAD WITHOUT CONTRAST CT CERVICAL SPINE WITHOUT CONTRAST TECHNIQUE: Multidetector CT imaging of the head and cervical spine was performed following the standard protocol without intravenous contrast. Multiplanar CT image reconstructions of the cervical spine were also generated. RADIATION DOSE REDUCTION: This exam was performed according to the departmental dose-optimization program which includes automated exposure control, adjustment of the mA and/or kV according to patient size and/or use of iterative reconstruction technique. COMPARISON:  CT head 05/07/2010 FINDINGS: CT HEAD FINDINGS Brain: Prominence of the lateral ventricles may be related to central predominant atrophy, although a component of normal pressure/communicating hydrocephalus cannot be excluded. Similar-appearing right parietooccipital lobe encephalomalacia. No evidence of large-territorial acute infarction. No parenchymal hemorrhage. No mass lesion. No extra-axial collection. No mass effect or midline shift. No hydrocephalus. Basilar cisterns are patent. Vascular: No hyperdense vessel. Skull: No acute fracture or focal lesion. Sinuses/Orbits: Paranasal sinuses and mastoid air cells are clear. The orbits are unremarkable. Other: None. CT CERVICAL SPINE FINDINGS Alignment: Normal. Skull base and vertebrae: Multilevel moderate degenerative changes of the spine. No associated severe osseous neural foraminal or central canal stenosis. No acute fracture. No aggressive appearing focal osseous lesion or focal pathologic  process. Soft tissues and spinal canal: No prevertebral fluid or swelling. No visible canal hematoma. Upper chest: Right apical 9 x 8 mm pulmonary nodule. Biapical centrilobular and paraseptal emphysematous changes. Other: None. IMPRESSION: 1. No acute intracranial abnormality. 2. No acute displaced fracture or traumatic listhesis of the cervical spine. 3. Right apical 9 x 8 mm pulmonary nodule. Recommend outpatient CT chest for further evaluation. 4.  Emphysema (ICD10-J43.9). Electronically Signed   By: Morgane  Naveau M.D.   On: 05/31/2024 19:40   DG Chest Portable 1 View Result Date: 05/31/2024 CLINICAL DATA:  Hypoxia EXAM: PORTABLE CHEST 1 VIEW COMPARISON:  Chest x-ray 03/29/2024 FINDINGS:  The heart is enlarged. There is no focal lung infiltrate, pleural effusion or pneumothorax. No acute fractures are seen. IMPRESSION: Cardiomegaly. No acute pulmonary process.  The Electronically Signed   By: Greig Pique M.D.   On: 05/31/2024 18:14   DG Pelvis Portable Result Date: 05/31/2024 CLINICAL DATA:  Seizure EXAM: PORTABLE PELVIS 1-2 VIEWS COMPARISON:  01/10/2024 FINDINGS: Partially visualized left hip replacement with normal alignment. Pubic symphysis and rami appear intact. Mild right hip degenerative change. No definitive fracture or malalignment. IMPRESSION: No acute osseous abnormality. Electronically Signed   By: Luke Bun M.D.   On: 05/31/2024 18:13     Medications Ordered in the ED  levETIRAcetam  (KEPPRA ) undiluted injection 1,500 mg (has no administration in time range)  phenytoin  (DILANTIN ) ER capsule 200 mg (has no administration in time range)  phenytoin  (DILANTIN ) chewable tablet 25 mg (has no administration in time range)  memantine  (NAMENDA ) tablet 10 mg (has no administration in time range)  aspirin  EC tablet 81 mg (has no administration in time range)  LORazepam  (ATIVAN ) tablet 1 mg (has no administration in time range)  LORazepam  (ATIVAN ) injection 1 mg (1 mg Intravenous Given  05/31/24 1903)  acetaminophen  (TYLENOL ) suppository 650 mg (650 mg Rectal Given 05/31/24 1921)                                    Medical Decision Making Amount and/or Complexity of Data Reviewed Labs: ordered. Radiology: ordered.  Risk OTC drugs. Prescription drug management. Decision regarding hospitalization.   Patient presents to the ED for concern of seizure, this involves an extensive number of treatment options, and is a complaint that carries with it a high risk of complications and morbidity.  The differential diagnosis includes ICH, epilepsy, electrolyte abnormalities   Co morbidities that complicate the patient evaluation  Hx of    Additional history obtained:  Additional history obtained from Nursing and Outside Medical Records   External records from outside source obtained and reviewed including triage RN note, ED note from this morning   Lab Tests:  I Ordered, and personally interpreted labs.  The pertinent results include:   UA wo infection Mild leukocytosis of 10.7 Lactic WNL CBG 93   Imaging Studies ordered:  I ordered imaging studies including CT head, cervical spine, CXR, pelvis I independently visualized and interpreted imaging which showed no intracranial abnormalities, PNA, pelvis fx I agree with the radiologist interpretation   Cardiac Monitoring:  The patient was maintained on a cardiac monitor.  I personally viewed and interpreted the cardiac monitored which showed an underlying rhythm of: NSR at 92bpm with no T wave nor ST abnormalities   Medicines ordered and prescription drug management:  I ordered medication including ativan , keppra , dilantin   for seizure, home seizure meds  Reevaluation of the patient after these medicines showed that the patient improved I have reviewed the patients home medicines and have made adjustments as needed    Consultations Obtained:  I requested consultation with neurology Dr. Michaela,  and  discussed lab and imaging findings as well as pertinent plan - they recommend:   Ativan  1mg  to raise seizure threshold Nighttime keppra  and dilantin  Medicine admission I requested consultation with hospitalist Dr. Niels Hurst,  and discussed lab and imaging findings as well as pertinent plan - accepts patient for admission   Problem List / ED Course:  Seizure Was seen this morning for seizure and DC with  no additional seizures Did obtain CT imaging for increased seizures, possible fall while walking in ER today as family was not there to clarify that he did not hit his head and seizure was witnessed.  Fortunately, no ICH noted on CT Lab work from this morning reports dilantin  WNL. Keppra  pending. Reports compliance with meds Had two seizures following ED eval this morning. 1 seizure in ED at 1900. Tonic clonic activity that lasted one minute. Ativan  1mg  for seizure. No seizure since  Family reports patient is at baseline following seizure in ED Labwork unremarkable for obvious infection. CXR wo PNA. UA wo infection. CT wo ICH. No known trigger Neuro consult as noted above Ordered home night meds of dilantin  PO and keppra  IV Will admit for obs, neuro f/u   Reevaluation:  After the interventions noted above, I reevaluated the patient and found that they have :stayed the same   Social Determinants of Health:  Former tobacco use Lives at home with nephew   Dispostion:  After consideration of the diagnostic results and the patients response to treatment, I feel that the patent would benefit from admission for obs, neuro consult.   Discussed ED workup, dispo with patient's family. They agree with plan  Final diagnoses:  Seizure [facility]    ED Discharge Orders     None        Minnie Tinnie BRAVO, PA 06/01/24 [id number]    Randol Simmonds, MD 06/01/24 [phone]

## 2024-05-31 NOTE — ED Provider Notes (Signed)
 [place] EMERGENCY DEPARTMENT AT [facility] Provider Note   CSN: [id number] Arrival date & time: 05/31/24  9496     Patient presents with: No chief complaint on file.   [name] is a 73 y.o. male.   The history is provided by the EMS personnel. The history is limited by the condition of the patient (Dementia).   He has history of primary biliary cirrhosis, stroke, alcohol abuse, seizures, dementia and is brought by ambulance after having a seizure at home.  Seizure was witnessed by family members.  There was urinary incontinence, no bit lip or tongue.  He has been compliant with his medications.  He apparently has breakthrough seizures about once a month.  Per family, he is back to his baseline mental status.    Prior to Admission medications   Medication Sig Start Date End Date Taking? Authorizing Provider  aspirin  EC 81 MG tablet Take 1 tablet (81 mg total) by mouth daily. 03/04/20   Vicci Barnie NOVAK, MD  lacosamide  150 MG TABS Take 1 tablet (150 mg total) by mouth 2 (two) times daily. 01/12/24   Will Almarie MATSU, MD  levETIRAcetam  (KEPPRA ) 750 MG tablet Take 2 tablets (1,500 mg total) by mouth 2 (two) times daily. Patient taking differently: Take 1,500 mg by mouth See admin instructions. Take 1,500 mg by mouth in the morning and evening 11/27/23   Vicci Barnie NOVAK, MD  loratadine  (CLARITIN ) 10 MG tablet TAKE 1 TABLET BY MOUTH EVERY DAY 03/17/24   Vicci Barnie NOVAK, MD  memantine  (NAMENDA ) 10 MG tablet Take 1 tablet (10 mg total) by mouth 2 (two) times daily. 02/19/24   Vicci Barnie NOVAK, MD  ondansetron  (ZOFRAN ) 4 MG tablet Take 1 tablet (4 mg total) by mouth every 6 (six) hours as needed for nausea. 12/30/23   Arlon Carliss ORN, DO  pantoprazole  (PROTONIX ) 40 MG tablet Take 1 tablet (40 mg total) by mouth daily. 02/19/24   Vicci Barnie NOVAK, MD  phenytoin  (DILANTIN ) 100 MG ER capsule Take 2 capsules (200 mg total) by mouth at bedtime. FOR SEIZURES 02/19/24    Vicci Barnie NOVAK, MD  PHENYTOIN  INFATABS 50 MG tablet Chew 0.5 tablets (25 mg total) by mouth at bedtime. 02/19/24   Vicci Barnie NOVAK, MD  rosuvastatin  (CRESTOR ) 10 MG tablet Take 1 tablet (10 mg total) by mouth daily. STOP Simvastatin  11/27/23   Vicci Barnie NOVAK, MD  ursodiol  (ACTIGALL ) 500 MG tablet Take 1 tablet (500 mg total) by mouth 2 (two) times daily. 05/14/24 05/09/25  [name], PA-C    Allergies: Patient has no known allergies.    Review of Systems  Unable to perform ROS: Dementia    Updated Vital Signs BP 119/75 (BP Location: Left Arm)   Pulse 66   Temp 97.9 F (36.6 C) (Oral)   Resp 18   SpO2 95%   Physical Exam Vitals and nursing note reviewed.   73 year old male, resting comfortably and in no acute distress. Vital signs are normal. Oxygen saturation is 95%, which is normal. Head is normocephalic and atraumatic. PERRLA, EOMI. Neck is nontender and supple without adenopathy. Lungs are clear without rales, wheezes, or rhonchi. Chest is nontender. Heart has regular rate and rhythm without murmur. Abdomen is soft, flat, nontender. Extremities have no cyanosis or edema, full range of motion is present. Skin is warm and dry without rash. Neurologic: Awake and alert, oriented to person but not place or time.  (all labs ordered are listed,  but only abnormal results are displayed) Labs Reviewed  COMPREHENSIVE METABOLIC PANEL WITH GFR - Abnormal; Notable for the following components:      Result Value   Alkaline Phosphatase 147 (*)    All other components within normal limits  CBG MONITORING, ED - Abnormal; Notable for the following components:   Glucose-Capillary 104 (*)    All other components within normal limits  CBC WITH DIFFERENTIAL/PLATELET  PHENYTOIN  LEVEL, TOTAL  LEVETIRACETAM  LEVEL      Procedures   Medications Ordered in the ED - No data to display                                  Medical Decision Making Amount and/or Complexity of Data  Reviewed Labs: ordered.   Seizure in patient with known seizure disorder.  I reviewed his past records, and he was seen in the emergency department on 04/11/2024 for breakthrough seizure.  Phenytoin  levels in prior ED visits had been therapeutic.  I will check screening labs and phenytoin  level, I have also ordered a levetiracetam  level but that will not be back during this ED visit.  I have reviewed his laboratory test, my interpretation is stable mild elevation of alkaline phosphatase and otherwise normal metabolic panel, normal CBC, therapeutic phenytoin  level.  He has had no further seizures in the emergency department.  I am discharging him with instructions to follow-up with his neurologist.     Final diagnoses:  Seizure (HCC)  Elevated alkaline phosphatase level    ED Discharge Orders     None          Raford Lenis, MD 05/31/24 [phone]

## 2024-05-31 NOTE — ED Notes (Signed)
 Spoke with pts family member and informed of discharge plan. Ride on the way.

## 2024-06-01 ENCOUNTER — Observation Stay (HOSPITAL_COMMUNITY): Payer: Medicare (Managed Care)

## 2024-06-01 DIAGNOSIS — S8264XA Nondisplaced fracture of lateral malleolus of right fibula, initial encounter for closed fracture: Secondary | ICD-10-CM | POA: Diagnosis not present

## 2024-06-01 DIAGNOSIS — R569 Unspecified convulsions: Secondary | ICD-10-CM

## 2024-06-01 DIAGNOSIS — G40919 Epilepsy, unspecified, intractable, without status epilepticus: Secondary | ICD-10-CM

## 2024-06-01 LAB — BASIC METABOLIC PANEL WITH GFR
Anion gap: 9 (ref 5–15)
BUN: 13 mg/dL (ref 8–23)
CO2: 24 mmol/L (ref 22–32)
Calcium: 9.2 mg/dL (ref 8.9–10.3)
Chloride: 102 mmol/L (ref 98–111)
Creatinine, Ser: 0.96 mg/dL (ref 0.61–1.24)
GFR, Estimated: >60 mL/min (ref >60)
Glucose, Bld: 104 mg/dL — ABNORMAL HIGH (ref 70–99)
Potassium: 4.4 mmol/L (ref 3.5–5.1)
Sodium: 135 mmol/L (ref 135–145)

## 2024-06-01 LAB — MAGNESIUM: Magnesium: 1.7 mg/dL (ref 1.7–2.4)

## 2024-06-01 LAB — CBC
HCT: 39.4 % (ref 39.0–52.0)
Hemoglobin: 13.3 g/dL (ref 13.0–17.0)
MCH: 31.5 pg (ref 26.0–34.0)
MCHC: 33.8 g/dL (ref 30.0–36.0)
MCV: 93.4 fL (ref 80.0–100.0)
Platelets: 166 K/uL (ref 150–400)
RBC: 4.22 MIL/uL (ref 4.22–5.81)
RDW: 12 % (ref 11.5–15.5)
WBC: 8.2 K/uL (ref 4.0–10.5)
nRBC: 0 % (ref 0.0–0.2)

## 2024-06-01 LAB — PHOSPHORUS: Phosphorus: 3.1 mg/dL (ref 2.5–4.6)

## 2024-06-01 MED ORDER — LEVETIRACETAM (KEPPRA) 500 MG/5 ML ADULT IV PUSH: 500.0000 mg | Freq: Two times a day (BID) | INTRAVENOUS | Status: DC

## 2024-06-01 MED ORDER — SODIUM CHLORIDE 0.9 % IV SOLN
200.0000 mg | Freq: Once | INTRAVENOUS | Status: AC
  Administered 2024-06-01 (×2): 200 mg via INTRAVENOUS
  Filled 2024-06-01: qty 20

## 2024-06-01 MED ORDER — CLONAZEPAM 0.5 MG PO TABS
0.5000 mg | ORAL_TABLET | Freq: Once | ORAL | Status: AC
  Administered 2024-06-01 (×2): 0.5 mg via ORAL
  Filled 2024-06-01: qty 1

## 2024-06-01 MED ORDER — POLYETHYLENE GLYCOL 3350 17 G PO PACK: 17.0000 g | PACK | Freq: Every day | ORAL | Status: DC | PRN

## 2024-06-01 MED ORDER — LACOSAMIDE 50 MG PO TABS
150.0000 mg | ORAL_TABLET | Freq: Two times a day (BID) | ORAL | Status: DC
  Administered 2024-06-01 – 2024-06-02 (×6): 150 mg via ORAL
  Filled 2024-06-01 (×3): qty 3

## 2024-06-01 MED ORDER — LEVETIRACETAM 500 MG PO TABS
1500.0000 mg | ORAL_TABLET | Freq: Two times a day (BID) | ORAL | Status: DC
  Administered 2024-06-01 – 2024-06-02 (×6): 1500 mg via ORAL
  Filled 2024-06-01 (×3): qty 3

## 2024-06-01 MED ORDER — LACTATED RINGERS IV SOLN: INTRAVENOUS | Status: AC

## 2024-06-01 MED ORDER — ACETAMINOPHEN 325 MG PO TABS: 650.0000 mg | ORAL_TABLET | Freq: Four times a day (QID) | ORAL | Status: DC | PRN

## 2024-06-01 MED ORDER — MELATONIN 5 MG PO TABS: 5.0000 mg | ORAL_TABLET | Freq: Every evening | ORAL | Status: DC | PRN

## 2024-06-01 MED ORDER — LEVETIRACETAM (KEPPRA) 500 MG/5 ML ADULT IV PUSH: 1500.0000 mg | Freq: Two times a day (BID) | INTRAVENOUS | Status: DC

## 2024-06-01 MED ORDER — LORAZEPAM 2 MG/ML IJ SOLN: 2.0000 mg | INTRAMUSCULAR | Status: DC | PRN

## 2024-06-01 MED ORDER — SODIUM CHLORIDE 0.9 % IV SOLN
200.0000 mg | Freq: Two times a day (BID) | INTRAVENOUS | Status: DC
  Filled 2024-06-01: qty 20

## 2024-06-01 MED ORDER — PROCHLORPERAZINE EDISYLATE 10 MG/2ML IJ SOLN: 5.0000 mg | Freq: Four times a day (QID) | INTRAMUSCULAR | Status: DC | PRN

## 2024-06-01 MED ORDER — ENOXAPARIN SODIUM 40 MG/0.4ML IJ SOSY
40.0000 mg | PREFILLED_SYRINGE | INTRAMUSCULAR | Status: DC
  Administered 2024-06-01 – 2024-06-02 (×4): 40 mg via SUBCUTANEOUS
  Filled 2024-06-01 (×2): qty 0.4

## 2024-06-01 NOTE — Progress Notes (Signed)
 Orthopedic Tech Progress Note Patient Details:  [name] 06/04/1951 [id number]  Ortho Devices Type of Ortho Device: CAM walker Ortho Device/Splint Interventions: Adjustment, Ordered   Post Interventions Instructions Provided: Care of device, Adjustment of device Cam walker left at bedside per patient request, did not want it on while in bed. [name] 06/01/2024, 5:55 PM

## 2024-06-01 NOTE — Progress Notes (Signed)
 PT Cancellation Note  Patient Details Name: [name] MRN: [id number] DOB: [birth year]   Cancelled Treatment:    Reason Eval/Treat Not Completed: Medical issues which prohibited therapy Noting pt with distal fibula fx and ortho consult.  Will f/u after receive recommendations for ortho.  Benjiman, PT Acute Rehab [facility] [phone]   Benjiman VEAR Mulberry 06/01/2024, 3:32 PM

## 2024-06-01 NOTE — Consult Note (Addendum)
 NEUROLOGY CONSULT NOTE   Date of service: June 01, 2024 Patient Name: [name] MRN:  [id number] DOB:  20-Feb-1951 Chief Complaint: Seizures Requesting Provider: Shona Terry SAILOR, [name]  History of Present Illness  [name] is a 73 y.o. male with hx of dementia, prior CVA, seizures who presented with multiple breakthrough seizures.  These are witnessed by family and represents a significant increase from typical.  He takes three antiepileptics, phenytoin , Keppra , and lacosamide .  His phenytoin  level was therapeutic this morning when he presented with breakthrough seizures at 11.9.  Due to multiple recurrent seizures, I advised that he be given a milligram of p.o. Ativan  to help treat seizure flurry and monitoring overnight.  Neurology has been consulted for further management.     Past History   Past Medical History:  Diagnosis Date   Alcoholism (HCC)    History of, not active   Chronic hepatitis C (HCC)    Dementia (HCC)    Diverticulosis    Gait disorder    Gingival hypertrophy    Secondary to Dilantin    Hemiparesis and alteration of sensations as late effects of stroke (HCC) 01/25/2017   Left hemiparesis   Hiatal hernia    History of alcoholism (HCC)    Hx of ischemic vertebrobasilar artery thalamic stroke    Right   Internal hemorrhoids    Memory disorder 03/04/2013   Primary biliary cirrhosis (HCC)    Seizures (HCC)    Stroke (HCC)    Right frontal, right thalamic   Tubular adenoma of colon    Ulnar neuropathy of left upper extremity     Past Surgical History:  Procedure Laterality Date   COLONOSCOPY WITH PROPOFOL  [name]/A 07/10/2017   Procedure: COLONOSCOPY WITH PROPOFOL ;  Surgeon: Elicia Claw, [name];  Location: [facility] ENDOSCOPY;  Service: Gastroenterology;  Laterality: [name]/A;   ESOPHAGOGASTRODUODENOSCOPY (EGD) WITH PROPOFOL  [name]/A 07/10/2017   Procedure: ESOPHAGOGASTRODUODENOSCOPY (EGD) WITH PROPOFOL ;  Surgeon: Elicia Claw, [name];  Location: [facility] ENDOSCOPY;  Service:  Gastroenterology;  Laterality: [name]/A;   HIP ARTHROPLASTY Left 11/26/2013   Procedure: LEFT HIP HEMIARTHROPLASTY;  Surgeon: [name], [name];  Location: [facility] OR;  Service: Orthopedics;  Laterality: Left;   WRIST SURGERY Left 95 & 96    Family History: Family History  Problem Relation Age of Onset   Pulmonary embolism Mother    Liver disease Father    Diabetes Sister    Hypertension Sister    Hypertension Sister    Hypertension Sister    Seizures Neg Hx     Social History  reports that he quit smoking about 10 years ago. His smoking use included cigarettes. He has quit using smokeless tobacco. He reports that he does not drink alcohol and does not use drugs.  No Known Allergies  Medications   Current Facility-Administered Medications:    acetaminophen  (TYLENOL ) tablet 650 mg, 650 mg, Oral, Q6H PRN, [name], [name], [name]   aspirin  EC tablet 81 mg, 81 mg, Oral, Daily, [name], [name], [name]   enoxaparin  (LOVENOX ) injection 40 mg, 40 mg, Subcutaneous, Q24H, [name], [name], [name]   lacosamide  (VIMPAT ) 200 mg in sodium chloride  0.9 % 25 mL IVPB, 200 mg, Intravenous, Once, Joshua Aisha SQUIBB, [name]   lacosamide  (VIMPAT ) tablet 150 mg, 150 mg, Oral, BID, Joshua Aisha SQUIBB, [name]   lactated ringers  infusion, , Intravenous, Continuous, [name], [name], [name]   levETIRAcetam  (KEPPRA ) tablet 1,500 mg, 1,500 mg, Oral, BID, [name], [name], [name]   [name]  (ATIVAN ) injection 2  mg, 2 mg, Intravenous, Q4H PRN, [name], [name], [name]   melatonin tablet 5 mg, 5 mg, Oral, QHS PRN, [name], [name], [name]   memantine  (NAMENDA ) tablet 10 mg, 10 mg, Oral, BID, [name], [name], [name], 10 mg at 06-26-2024 2316   phenytoin  (DILANTIN ) chewable tablet 25 mg, 25 mg, Oral, QHS, [name], [name], [name], 25 mg at June 26, 2024 2316   phenytoin  (DILANTIN ) ER capsule 200 mg, 200 mg, Oral, QHS, [name], [name], [name], 200 mg at 06/26/2024 2315   polyethylene glycol (MIRALAX  / GLYCOLAX ) packet 17 g, 17 g, Oral, Daily PRN, Joshua, [name], [name]    prochlorperazine  (COMPAZINE ) injection 5 mg, 5 mg, Intravenous, Q6H PRN, Joshua Terry SAILOR, [name]  Current Outpatient Medications:    aspirin  EC 81 MG tablet, Take 1 tablet (81 mg total) by mouth daily., Disp: 100 tablet, Rfl: 0   memantine  (NAMENDA ) 10 MG tablet, Take 1 tablet (10 mg total) by mouth 2 (two) times daily., Disp: 180 tablet, Rfl: 0   phenytoin  (DILANTIN ) 100 MG ER capsule, Take 2 capsules (200 mg total) by mouth at bedtime. FOR SEIZURES, Disp: 180 capsule, Rfl: 2   PHENYTOIN  INFATABS 50 MG tablet, Chew 0.5 tablets (25 mg total) by mouth at bedtime., Disp: 45 tablet, Rfl: 1   levETIRAcetam  (KEPPRA ) 750 MG tablet, Take 750 mg by mouth 2 (two) times daily., Disp: , Rfl:    pantoprazole  (PROTONIX ) 40 MG tablet, Take 40 mg by mouth daily., Disp: , Rfl:    rosuvastatin  (CRESTOR ) 10 MG tablet, Take 10 mg by mouth daily., Disp: , Rfl:   Vitals   Vitals:   06-26-24 2300 Jun 26, 2024 2315 26-Jun-2024 2330 06/01/24 0209  BP: 137/76 (!) 144/93 (!) 148/83   Pulse:   90   Resp:   18   Temp:    98.4 F (36.9 C)  TempSrc:      SpO2:   100%   Weight:      Height:        Body mass index is 26.31 kg/m.   Physical Exam   Constitutional: Appears well-developed and well-nourished.   Neurologic Examination    Neuro: Mental Status: Patient is awake, alert, he knows that he is in the hospital at [facility] but is unable to give the month, year, or age Cranial Nerves: II: Visual Fields are full. Pupils are equal, round, and reactive to light.   III,IV, VI: EOMI without ptosis or diploplia.  V: Facial sensation is symmetric to touch VII: Facial movement with very subtle left facial weakness Motor: He has mild weakness to confrontation in the left upper extremity, but no drift good strength in the other extremities sensory: Sensation is symmetric to light touch in the arms and legs. Cerebellar: No clear ataxia, consistent with weakness of the left       Labs/Imaging/Neurodiagnostic  studies   CBC:  Recent Labs  Lab 06-26-2024 0549 06/26/24 2137  WBC 6.2 10.7*  NEUTROABS 4.3 8.3*  HGB 13.9 13.6  HCT 40.8 39.8  MCV 93.6 93.2  PLT 173 168   Basic Metabolic Panel:  Lab Results  Component Value Date   NA 135 2024/06/26   K 4.2 Jun 26, 2024   CO2 23 06-26-2024   GLUCOSE 110 (H) 06-26-24   BUN 15 06-26-2024   CREATININE 1.03 06-26-2024   CALCIUM  9.1 06-26-24   GFRNONAA >60 2024-06-26   GFRAA >60 07/25/2020   Lipid Panel:  Lab Results  Component Value Date   LDLCALC 58 08/20/2023  HgbA1c:  Lab Results  Component Value Date   HGBA1C 5.3 05/23/2016   Urine Drug Screen:     Component Value Date/Time   LABOPIA NONE DETECTED 12/29/2023 1511   COCAINSCRNUR NONE DETECTED 12/29/2023 1511   LABBENZ NONE DETECTED 12/29/2023 1511   AMPHETMU NONE DETECTED 12/29/2023 1511   THCU NONE DETECTED 12/29/2023 1511   LABBARB NONE DETECTED 12/29/2023 1511    Alcohol Level     Component Value Date/Time   ETH <10 12/29/2023 0751   INR  Lab Results  Component Value Date   INR 1.2 (H) 05/14/2024   APTT  Lab Results  Component Value Date   APTT 26 02/23/2023   AED levels:  Lab Results  Component Value Date   PHENYTOIN  11.6 05/31/2024   LEVETIRACETA 9.8 (L) 01/09/2024    CT Head without contrast(Personally reviewed):    ASSESSMENT   KYIAN OBST is a 73 y.o. male with a history of dementia and seizures who presents with multiple breakthrough seizures.  No clear signs of infection, so it is unclear what precipitated the seizure flurry.  He is currently taking 100 mg twice daily of lacosamide  and I will increase this to 150 twice daily.  RECOMMENDATIONS  Continue home Keppra  1500 twice daily Continue home phenytoin  225 nightly Increase lacosamide  from 100 twice daily to 150 twice daily I will give one more dose of benzodiazepine in the morning, 0.5 mg of Klonopin , just to cover him until his lacosamide  level is closer to steady state.  If no  further seizures by tomorrow, could discharge with plan to follow-up with outpatient neurology.   I [name] not think an EEG is likely to change management, I have discontinued this neurology will be available as needed, if the patient were to continue to have seizures ______________________________________________________________________    Signed, Aisha Seals, [name] [facility]

## 2024-06-01 NOTE — Patient Care Conference (Signed)
 Met with pt's other sister, Sonny, at bedside. Pt says OK to talk to this family member. Had tried to call pt's sister, Bruna, multiple times without success. Sonny to [employer] later. All questions were answered and family updated.

## 2024-06-01 NOTE — H&P (Incomplete)
 History and Physical  [name] FMW:[id number] DOB: 1950/11/29 DOA: 05/31/2024  Referring physician: Tinnie Matter, PA-EDP  PCP: Vicci Barnie NOVAK, MD  Outpatient Specialists: Neurology. Patient coming from: Home.  Chief Complaint: Breakthrough seizures  HPI: [name] is a 73 y.o. male with medical history significant for primary biliary cirrhosis, prior CVA, alcohol abuse, seizure disorder, dementia, presents to the ER with breakthrough seizures.  Seizures were witnessed by family members.  Reportedly epidural he has breakthrough seizures about once a month.  Has been compliant with his home AEDs.  His seizures were witnessed by family members, tonic-clonic and lasting a few minutes.    In the ER, the patient had another breakthrough seizure.  EDP discussed the case with neurology who will see in consultation.       ED Course: ***  Review of Systems: Review of systems as noted in the HPI. All other systems reviewed and are negative.   Past Medical History:  Diagnosis Date  . Alcoholism (HCC)    History of, not active  . Chronic hepatitis C (HCC)   . Dementia (HCC)   . Diverticulosis   . Gait disorder   . Gingival hypertrophy    Secondary to Dilantin   . Hemiparesis and alteration of sensations as late effects of stroke (HCC) 01/25/2017   Left hemiparesis  . Hiatal hernia   . History of alcoholism (HCC)   . Hx of ischemic vertebrobasilar artery thalamic stroke    Right  . Internal hemorrhoids   . Memory disorder 03/04/2013  . Primary biliary cirrhosis (HCC)   . Seizures (HCC)   . Stroke [facility])    Right frontal, right thalamic  . Tubular adenoma of colon   . Ulnar neuropathy of left upper extremity    Past Surgical History:  Procedure Laterality Date  . COLONOSCOPY WITH PROPOFOL  N/A 07/10/2017   Procedure: COLONOSCOPY WITH PROPOFOL ;  Surgeon: Elicia Claw, MD;  Location: [facility];  Service: Gastroenterology;  Laterality: N/A;  .  ESOPHAGOGASTRODUODENOSCOPY (EGD) WITH PROPOFOL  N/A 07/10/2017   Procedure: ESOPHAGOGASTRODUODENOSCOPY (EGD) WITH PROPOFOL ;  Surgeon: Elicia Claw, MD;  Location: [facility];  Service: Gastroenterology;  Laterality: N/A;  . HIP ARTHROPLASTY Left 11/26/2013   Procedure: LEFT HIP HEMIARTHROPLASTY;  Surgeon: [name], MD;  Location: [facility] OR;  Service: Orthopedics;  Laterality: Left;  . WRIST SURGERY Left 95 & 96    Social History:  reports that he quit smoking about 10 years ago. His smoking use included cigarettes. He has quit using smokeless tobacco. He reports that he does not drink alcohol and does not use drugs.   No Known Allergies  Family History  Problem Relation Age of Onset  . Pulmonary embolism Mother   . Liver disease Father   . Diabetes Sister   . Hypertension Sister   . Hypertension Sister   . Hypertension Sister   . Seizures Neg Hx     ***  Prior to Admission medications   Medication Sig Start Date End Date Taking? Authorizing Provider  aspirin  EC 81 MG tablet Take 1 tablet (81 mg total) by mouth daily. 03/04/20  Yes Vicci Barnie NOVAK, MD  memantine  (NAMENDA ) 10 MG tablet Take 1 tablet (10 mg total) by mouth 2 (two) times daily. 02/19/24  Yes Vicci Barnie NOVAK, MD  phenytoin  (DILANTIN ) 100 MG ER capsule Take 2 capsules (200 mg total) by mouth at bedtime. FOR SEIZURES 02/19/24  Yes Vicci Barnie NOVAK, MD  PHENYTOIN  INFATABS 50 MG tablet Chew 0.5 tablets (  25 mg total) by mouth at bedtime. 02/19/24  Yes Vicci Barnie NOVAK, MD    Physical Exam: BP (!) 148/83   Pulse 90   Temp 98.4 F (36.9 C) (Oral)   Resp 18   Ht 5' 8 (1.727 m)   Wt 78.5 kg   SpO2 100%   BMI 26.31 kg/m   General: 73 y.o. year-old male well developed well nourished in no acute distress.  Alert and oriented x3. Cardiovascular: Regular rate and rhythm with no rubs or gallops.  No thyromegaly or JVD noted.  No lower extremity edema. 2/4 pulses in all 4 extremities. Respiratory: Clear  to auscultation with no wheezes or rales. Good inspiratory effort. Abdomen: Soft nontender nondistended with normal bowel sounds x4 quadrants. Muskuloskeletal: No cyanosis, clubbing or edema noted bilaterally Neuro: CN II-XII intact, strength, sensation, reflexes Skin: No ulcerative lesions noted or rashes Psychiatry: Judgement and insight appear normal. Mood is appropriate for condition and setting          Labs on Admission:  Basic Metabolic Panel: Recent Labs  Lab 05/31/24 0549 05/31/24 1920  NA 137 135  K 4.5 4.2  CL 103 103  CO2 25 23  GLUCOSE 98 110*  BUN 21 15  CREATININE 1.07 1.03  CALCIUM  9.0 9.1   Liver Function Tests: Recent Labs  Lab 05/31/24 0549 05/31/24 1920  AST 16 18  ALT 12 14  ALKPHOS 147* 146*  BILITOT 0.4 0.7  PROT 7.7 7.2  ALBUMIN 3.8 3.7   No results for input(s): LIPASE, AMYLASE in the last 168 hours. No results for input(s): AMMONIA in the last 168 hours. CBC: Recent Labs  Lab 05/31/24 0549 05/31/24 2137  WBC 6.2 10.7*  NEUTROABS 4.3 8.3*  HGB 13.9 13.6  HCT 40.8 39.8  MCV 93.6 93.2  PLT 173 168   Cardiac Enzymes: No results for input(s): CKTOTAL, CKMB, CKMBINDEX, TROPONINI in the last 168 hours.  BNP (last 3 results) No results for input(s): BNP in the last 8760 hours.  ProBNP (last 3 results) No results for input(s): PROBNP in the last 8760 hours.  CBG: Recent Labs  Lab 05/31/24 0510 05/31/24 1752  GLUCAP 104* 93    Radiological Exams on Admission: CT Head Wo Contrast Result Date: 05/31/2024 CLINICAL DATA:  Head trauma, moderate-severe; Neck trauma (Age >= 65y) EXAM: CT HEAD WITHOUT CONTRAST CT CERVICAL SPINE WITHOUT CONTRAST TECHNIQUE: Multidetector CT imaging of the head and cervical spine was performed following the standard protocol without intravenous contrast. Multiplanar CT image reconstructions of the cervical spine were also generated. RADIATION DOSE REDUCTION: This exam was performed according  to the departmental dose-optimization program which includes automated exposure control, adjustment of the mA and/or kV according to patient size and/or use of iterative reconstruction technique. COMPARISON:  CT head 05/07/2010 FINDINGS: CT HEAD FINDINGS Brain: Prominence of the lateral ventricles may be related to central predominant atrophy, although a component of normal pressure/communicating hydrocephalus cannot be excluded. Similar-appearing right parietooccipital lobe encephalomalacia. No evidence of large-territorial acute infarction. No parenchymal hemorrhage. No mass lesion. No extra-axial collection. No mass effect or midline shift. No hydrocephalus. Basilar cisterns are patent. Vascular: No hyperdense vessel. Skull: No acute fracture or focal lesion. Sinuses/Orbits: Paranasal sinuses and mastoid air cells are clear. The orbits are unremarkable. Other: None. CT CERVICAL SPINE FINDINGS Alignment: Normal. Skull base and vertebrae: Multilevel moderate degenerative changes of the spine. No associated severe osseous neural foraminal or central canal stenosis. No acute fracture. No aggressive appearing focal osseous lesion  or focal pathologic process. Soft tissues and spinal canal: No prevertebral fluid or swelling. No visible canal hematoma. Upper chest: Right apical 9 x 8 mm pulmonary nodule. Biapical centrilobular and paraseptal emphysematous changes. Other: None. IMPRESSION: 1. No acute intracranial abnormality. 2. No acute displaced fracture or traumatic listhesis of the cervical spine. 3. Right apical 9 x 8 mm pulmonary nodule. Recommend outpatient CT chest for further evaluation. 4.  Emphysema (ICD10-J43.9). Electronically Signed   By: Morgane  Naveau M.D.   On: 05/31/2024 19:40   CT Cervical Spine Wo Contrast Result Date: 05/31/2024 CLINICAL DATA:  Head trauma, moderate-severe; Neck trauma (Age >= 65y) EXAM: CT HEAD WITHOUT CONTRAST CT CERVICAL SPINE WITHOUT CONTRAST TECHNIQUE: Multidetector CT  imaging of the head and cervical spine was performed following the standard protocol without intravenous contrast. Multiplanar CT image reconstructions of the cervical spine were also generated. RADIATION DOSE REDUCTION: This exam was performed according to the departmental dose-optimization program which includes automated exposure control, adjustment of the mA and/or kV according to patient size and/or use of iterative reconstruction technique. COMPARISON:  CT head 05/07/2010 FINDINGS: CT HEAD FINDINGS Brain: Prominence of the lateral ventricles may be related to central predominant atrophy, although a component of normal pressure/communicating hydrocephalus cannot be excluded. Similar-appearing right parietooccipital lobe encephalomalacia. No evidence of large-territorial acute infarction. No parenchymal hemorrhage. No mass lesion. No extra-axial collection. No mass effect or midline shift. No hydrocephalus. Basilar cisterns are patent. Vascular: No hyperdense vessel. Skull: No acute fracture or focal lesion. Sinuses/Orbits: Paranasal sinuses and mastoid air cells are clear. The orbits are unremarkable. Other: None. CT CERVICAL SPINE FINDINGS Alignment: Normal. Skull base and vertebrae: Multilevel moderate degenerative changes of the spine. No associated severe osseous neural foraminal or central canal stenosis. No acute fracture. No aggressive appearing focal osseous lesion or focal pathologic process. Soft tissues and spinal canal: No prevertebral fluid or swelling. No visible canal hematoma. Upper chest: Right apical 9 x 8 mm pulmonary nodule. Biapical centrilobular and paraseptal emphysematous changes. Other: None. IMPRESSION: 1. No acute intracranial abnormality. 2. No acute displaced fracture or traumatic listhesis of the cervical spine. 3. Right apical 9 x 8 mm pulmonary nodule. Recommend outpatient CT chest for further evaluation. 4.  Emphysema (ICD10-J43.9). Electronically Signed   By: Morgane  Naveau  M.D.   On: 05/31/2024 19:40   DG Chest Portable 1 View Result Date: 05/31/2024 CLINICAL DATA:  Hypoxia EXAM: PORTABLE CHEST 1 VIEW COMPARISON:  Chest x-ray 03/29/2024 FINDINGS: The heart is enlarged. There is no focal lung infiltrate, pleural effusion or pneumothorax. No acute fractures are seen. IMPRESSION: Cardiomegaly. No acute pulmonary process.  The Electronically Signed   By: Greig Pique M.D.   On: 05/31/2024 18:14   DG Pelvis Portable Result Date: 05/31/2024 CLINICAL DATA:  Seizure EXAM: PORTABLE PELVIS 1-2 VIEWS COMPARISON:  01/10/2024 FINDINGS: Partially visualized left hip replacement with normal alignment. Pubic symphysis and rami appear intact. Mild right hip degenerative change. No definitive fracture or malalignment. IMPRESSION: No acute osseous abnormality. Electronically Signed   By: Luke Bun M.D.   On: 05/31/2024 18:13    EKG: I independently viewed the EKG done and my findings are as followed: ***   Assessment/Plan Present on Admission: . Breakthrough seizure (HCC)  Principal Problem:   Breakthrough seizure (HCC)   DVT prophylaxis: ***   Code Status: ***   Family Communication: ***   Disposition Plan: ***   Consults called: ***   Admission status: ***    Status is: Inpatient {  Inpatient:23812}   Terry LOISE Hurst MD [facility] Pager [phone]  If 7PM-7AM, please contact night-coverage [email or web address] Password [email or web address]  06/01/2024, 12:01 AM

## 2024-06-01 NOTE — Consult Note (Addendum)
 NEUROLOGY CONSULT NOTE   Date of service: June 01, 2024 Patient Name: [name] MRN:  [id number] DOB:  20-Feb-1951 Chief Complaint: Seizures Requesting Provider: Shona Terry SAILOR, DO  History of Present Illness  [name] is a 73 y.o. male with hx of dementia, prior CVA, seizures who presented with multiple breakthrough seizures.  These are witnessed by family and represents a significant increase from typical.  He takes three antiepileptics, phenytoin , Keppra , and lacosamide .  His phenytoin  level was therapeutic this morning when he presented with breakthrough seizures at 11.9.  Due to multiple recurrent seizures, I advised that he be given a milligram of p.o. Ativan  to help treat seizure flurry and monitoring overnight.  Neurology has been consulted for further management.     Past History   Past Medical History:  Diagnosis Date   Alcoholism (HCC)    History of, not active   Chronic hepatitis C (HCC)    Dementia (HCC)    Diverticulosis    Gait disorder    Gingival hypertrophy    Secondary to Dilantin    Hemiparesis and alteration of sensations as late effects of stroke (HCC) 01/25/2017   Left hemiparesis   Hiatal hernia    History of alcoholism (HCC)    Hx of ischemic vertebrobasilar artery thalamic stroke    Right   Internal hemorrhoids    Memory disorder 03/04/2013   Primary biliary cirrhosis (HCC)    Seizures (HCC)    Stroke (HCC)    Right frontal, right thalamic   Tubular adenoma of colon    Ulnar neuropathy of left upper extremity     Past Surgical History:  Procedure Laterality Date   COLONOSCOPY WITH PROPOFOL  [name]/A 07/10/2017   Procedure: COLONOSCOPY WITH PROPOFOL ;  Surgeon: Elicia Claw, MD;  Location: [facility];  Service: Gastroenterology;  Laterality: [name]/A;   ESOPHAGOGASTRODUODENOSCOPY (EGD) WITH PROPOFOL  [name]/A 07/10/2017   Procedure: ESOPHAGOGASTRODUODENOSCOPY (EGD) WITH PROPOFOL ;  Surgeon: Elicia Claw, MD;  Location: [facility];  Service:  Gastroenterology;  Laterality: [name]/A;   HIP ARTHROPLASTY Left 11/26/2013   Procedure: LEFT HIP HEMIARTHROPLASTY;  Surgeon: [name], MD;  Location: [facility] OR;  Service: Orthopedics;  Laterality: Left;   WRIST SURGERY Left 95 & 96    Family History: Family History  Problem Relation Age of Onset   Pulmonary embolism Mother    Liver disease Father    Diabetes Sister    Hypertension Sister    Hypertension Sister    Hypertension Sister    Seizures Neg Hx     Social History  reports that he quit smoking about 10 years ago. His smoking use included cigarettes. He has quit using smokeless tobacco. He reports that he does not drink alcohol and does not use drugs.  No Known Allergies  Medications   Current Facility-Administered Medications:    acetaminophen  (TYLENOL ) tablet 650 mg, 650 mg, Oral, Q6H PRN, Hall, Joshua N, DO   aspirin  EC tablet 81 mg, 81 mg, Oral, Daily, Minnie, Joshua E, PA   enoxaparin  (LOVENOX ) injection 40 mg, 40 mg, Subcutaneous, Q24H, Hall, Joshua N, DO   lacosamide  (VIMPAT ) 200 mg in sodium chloride  0.9 % 25 mL IVPB, 200 mg, Intravenous, Once, Joshua Aisha SQUIBB, MD   lacosamide  (VIMPAT ) tablet 150 mg, 150 mg, Oral, BID, Joshua Aisha SQUIBB, MD   lactated ringers  infusion, , Intravenous, Continuous, Hall, Joshua N, DO   levETIRAcetam  (KEPPRA ) tablet 1,500 mg, 1,500 mg, Oral, BID, Hall, Joshua N, DO   LORazepam  (ATIVAN ) injection 2  mg, 2 mg, Intravenous, Q4H PRN, Hall, Joshua N, DO   melatonin tablet 5 mg, 5 mg, Oral, QHS PRN, Hall, Joshua N, DO   memantine  (NAMENDA ) tablet 10 mg, 10 mg, Oral, BID, Minnie, Joshua E, PA, 10 mg at 06-26-2024 2316   phenytoin  (DILANTIN ) chewable tablet 25 mg, 25 mg, Oral, QHS, [name], Joshua E, PA, 25 mg at June 26, 2024 2316   phenytoin  (DILANTIN ) ER capsule 200 mg, 200 mg, Oral, QHS, [name], Joshua E, PA, 200 mg at 06/26/2024 2315   polyethylene glycol (MIRALAX  / GLYCOLAX ) packet 17 g, 17 g, Oral, Daily PRN, Joshua, Joshua N, DO    prochlorperazine  (COMPAZINE ) injection 5 mg, 5 mg, Intravenous, Q6H PRN, Joshua Terry SAILOR, DO  Current Outpatient Medications:    aspirin  EC 81 MG tablet, Take 1 tablet (81 mg total) by mouth daily., Disp: 100 tablet, Rfl: 0   memantine  (NAMENDA ) 10 MG tablet, Take 1 tablet (10 mg total) by mouth 2 (two) times daily., Disp: 180 tablet, Rfl: 0   phenytoin  (DILANTIN ) 100 MG ER capsule, Take 2 capsules (200 mg total) by mouth at bedtime. FOR SEIZURES, Disp: 180 capsule, Rfl: 2   PHENYTOIN  INFATABS 50 MG tablet, Chew 0.5 tablets (25 mg total) by mouth at bedtime., Disp: 45 tablet, Rfl: 1   levETIRAcetam  (KEPPRA ) 750 MG tablet, Take 750 mg by mouth 2 (two) times daily., Disp: , Rfl:    pantoprazole  (PROTONIX ) 40 MG tablet, Take 40 mg by mouth daily., Disp: , Rfl:    rosuvastatin  (CRESTOR ) 10 MG tablet, Take 10 mg by mouth daily., Disp: , Rfl:   Vitals   Vitals:   06-26-24 2300 Jun 26, 2024 2315 26-Jun-2024 2330 06/01/24 0209  BP: 137/76 (!) 144/93 (!) 148/83   Pulse:   90   Resp:   18   Temp:    98.4 F (36.9 C)  TempSrc:      SpO2:   100%   Weight:      Height:        Body mass index is 26.31 kg/m.   Physical Exam   Constitutional: Appears well-developed and well-nourished.   Neurologic Examination    Neuro: Mental Status: Patient is awake, alert, he knows that he is in the hospital at [name] but is unable to give the month, year, or age Cranial Nerves: II: Visual Fields are full. Pupils are equal, round, and reactive to light.   III,IV, VI: EOMI without ptosis or diploplia.  V: Facial sensation is symmetric to touch VII: Facial movement with very subtle left facial weakness Motor: He has mild weakness to confrontation in the left upper extremity, but no drift good strength in the other extremities sensory: Sensation is symmetric to light touch in the arms and legs. Cerebellar: No clear ataxia, consistent with weakness of the left       Labs/Imaging/Neurodiagnostic  studies   CBC:  Recent Labs  Lab 06-26-2024 0549 06/26/24 2137  WBC 6.2 10.7*  NEUTROABS 4.3 8.3*  HGB 13.9 13.6  HCT 40.8 39.8  MCV 93.6 93.2  PLT 173 168   Basic Metabolic Panel:  Lab Results  Component Value Date   NA 135 2024/06/26   K 4.2 Jun 26, 2024   CO2 23 06-26-2024   GLUCOSE 110 (H) 06-26-24   BUN 15 06-26-2024   CREATININE 1.03 06-26-2024   CALCIUM  9.1 06-26-24   GFRNONAA >60 2024-06-26   GFRAA >60 07/25/2020   Lipid Panel:  Lab Results  Component Value Date   LDLCALC 58 08/20/2023  HgbA1c:  Lab Results  Component Value Date   HGBA1C 5.3 05/23/2016   Urine Drug Screen:     Component Value Date/Time   LABOPIA NONE DETECTED 12/29/2023 1511   COCAINSCRNUR NONE DETECTED 12/29/2023 1511   LABBENZ NONE DETECTED 12/29/2023 1511   AMPHETMU NONE DETECTED 12/29/2023 1511   THCU NONE DETECTED 12/29/2023 1511   LABBARB NONE DETECTED 12/29/2023 1511    Alcohol Level     Component Value Date/Time   ETH <10 12/29/2023 0751   INR  Lab Results  Component Value Date   INR 1.2 (H) 05/14/2024   APTT  Lab Results  Component Value Date   APTT 26 02/23/2023   AED levels:  Lab Results  Component Value Date   PHENYTOIN  11.6 05/31/2024   LEVETIRACETA 9.8 (L) 01/09/2024    CT Head without contrast(Personally reviewed):    ASSESSMENT   KYIAN OBST is a 73 y.o. male with a history of dementia and seizures who presents with multiple breakthrough seizures.  No clear signs of infection, so it is unclear what precipitated the seizure flurry.  He is currently taking 100 mg twice daily of lacosamide  and I will increase this to 150 twice daily.  RECOMMENDATIONS  Continue home Keppra  1500 twice daily Continue home phenytoin  225 nightly Increase lacosamide  from 100 twice daily to 150 twice daily I will give one more dose of benzodiazepine in the morning, 0.5 mg of Klonopin , just to cover him until his lacosamide  level is closer to steady state.  If no  further seizures by tomorrow, could discharge with plan to follow-up with outpatient neurology.   I do not think an EEG is likely to change management, I have discontinued this neurology will be available as needed, if the patient were to continue to have seizures ______________________________________________________________________    Signed, Aisha Seals, MD [facility]

## 2024-06-01 NOTE — Progress Notes (Signed)
   06/01/24 1237  TOC Brief Assessment  Insurance and Status Reviewed  Patient has primary care physician Yes  Home environment has been reviewed Resides in an apartment with family  Prior level of function: Needs some assistance with medications due to dementia  Prior/Current Home Services No current home services  Social Drivers of Health Review SDOH reviewed no interventions necessary  Readmission risk has been reviewed Yes  Transition of care needs no transition of care needs at this time

## 2024-06-01 NOTE — Consult Note (Signed)
 Reason for Consult: Right ankle distal fibula fracture Referring Physician: Garnette Pelt, MD  [name] is an 73 y.o. male.  HPI: The patient is a 73 year old gentleman who was admitted yesterday after having some type of seizure.  He is someone that I have seen in the past but it has been at least 10 years ago.  At that point he had a mechanical fall and had sustained a left displaced femoral neck fracture and then he underwent a hip hemiarthroplasty.  He was complaining of right ankle pain today and an x-ray was ordered and performed of the right ankle showing a nondisplaced lateral malleolus fracture of the distal fibula.  Orthopedic surgery is consulted for further evaluation and treatment of this fracture.  The patient does report some right ankle pain but denies any severe pain with the ankle.  Past Medical History:  Diagnosis Date   Alcoholism (HCC)    History of, not active   Chronic hepatitis C (HCC)    Dementia (HCC)    Diverticulosis    Gait disorder    Gingival hypertrophy    Secondary to Dilantin    Hemiparesis and alteration of sensations as late effects of stroke (HCC) 01/25/2017   Left hemiparesis   Hiatal hernia    History of alcoholism (HCC)    Hx of ischemic vertebrobasilar artery thalamic stroke    Right   Internal hemorrhoids    Memory disorder 03/04/2013   Primary biliary cirrhosis (HCC)    Seizures (HCC)    Stroke (HCC)    Right frontal, right thalamic   Tubular adenoma of colon    Ulnar neuropathy of left upper extremity     Past Surgical History:  Procedure Laterality Date   COLONOSCOPY WITH PROPOFOL  N/A 07/10/2017   Procedure: COLONOSCOPY WITH PROPOFOL ;  Surgeon: Elicia Claw, MD;  Location: [facility];  Service: Gastroenterology;  Laterality: N/A;   ESOPHAGOGASTRODUODENOSCOPY (EGD) WITH PROPOFOL  N/A 07/10/2017   Procedure: ESOPHAGOGASTRODUODENOSCOPY (EGD) WITH PROPOFOL ;  Surgeon: Elicia Claw, MD;  Location: [facility];  Service:  Gastroenterology;  Laterality: N/A;   HIP ARTHROPLASTY Left 11/26/2013   Procedure: LEFT HIP HEMIARTHROPLASTY;  Surgeon: [name], MD;  Location: [facility] OR;  Service: Orthopedics;  Laterality: Left;   WRIST SURGERY Left 95 & 96    Family History  Problem Relation Age of Onset   Pulmonary embolism Mother    Liver disease Father    Diabetes Sister    Hypertension Sister    Hypertension Sister    Hypertension Sister    Seizures Neg Hx     Social History:  reports that he quit smoking about 10 years ago. His smoking use included cigarettes. He has quit using smokeless tobacco. He reports that he does not drink alcohol and does not use drugs.  Allergies: No Known Allergies  Medications: I have reviewed the patient's current medications.  Results for orders placed or performed during the hospital encounter of 05/31/24 (from the past 48 hours)  CBG monitoring, ED     Status: None   Collection Time: 05/31/24  5:52 PM  Result Value Ref Range   Glucose-Capillary 93 70 - 99 mg/dL    Comment: Glucose reference range applies only to samples taken after fasting for at least 8 hours.  Comprehensive metabolic panel with GFR     Status: Abnormal   Collection Time: 05/31/24  7:20 PM  Result Value Ref Range   Sodium 135 135 - 145 mmol/L   Potassium 4.2 3.5 -  5.1 mmol/L   Chloride 103 98 - 111 mmol/L   CO2 23 22 - 32 mmol/L   Glucose, Bld 110 (H) 70 - 99 mg/dL    Comment: Glucose reference range applies only to samples taken after fasting for at least 8 hours.   BUN 15 8 - 23 mg/dL   Creatinine, Ser 8.96 0.61 - 1.24 mg/dL   Calcium  9.1 8.9 - 10.3 mg/dL   Total Protein 7.2 6.5 - 8.1 g/dL   Albumin 3.7 3.5 - 5.0 g/dL   AST 18 15 - 41 U/L   ALT 14 0 - 44 U/L   Alkaline Phosphatase 146 (H) 38 - 126 U/L   Total Bilirubin 0.7 0.0 - 1.2 mg/dL   GFR, Estimated >39 >39 mL/min    Comment: (NOTE) Calculated using the CKD-EPI Creatinine Equation (2021)    Anion gap 9 5 - 15    Comment:  Performed at [facility], [address], [place], [place] [address]  I-Stat CG4 Lactic Acid     Status: None   Collection Time: 05/31/24  7:29 PM  Result Value Ref Range   Lactic Acid, Venous 0.8 0.5 - 1.9 mmol/L  Urinalysis, Routine w reflex microscopic -Urine, Catheterized     Status: Abnormal   Collection Time: 05/31/24  8:17 PM  Result Value Ref Range   Color, Urine YELLOW YELLOW   APPearance HAZY (A) CLEAR   Specific Gravity, Urine 1.018 1.005 - 1.030   pH 5.0 5.0 - 8.0   Glucose, UA NEGATIVE NEGATIVE mg/dL   Hgb urine dipstick NEGATIVE NEGATIVE   Bilirubin Urine NEGATIVE NEGATIVE   Ketones, ur NEGATIVE NEGATIVE mg/dL   Protein, ur 30 (A) NEGATIVE mg/dL   Nitrite NEGATIVE NEGATIVE   Leukocytes,Ua NEGATIVE NEGATIVE   RBC / HPF 0-5 0 - 5 RBC/hpf   WBC, UA 0-5 0 - 5 WBC/hpf   Bacteria, UA NONE SEEN NONE SEEN   Squamous Epithelial / HPF 0-5 0 - 5 /HPF   Mucus PRESENT    Hyaline Casts, UA PRESENT     Comment: Performed at [facility], [address], [place], [place] [address]  CBC with Differential/Platelet     Status: Abnormal   Collection Time: 05/31/24  9:37 PM  Result Value Ref Range   WBC 10.7 (H) 4.0 - 10.5 K/uL   RBC 4.27 4.22 - 5.81 MIL/uL   Hemoglobin 13.6 13.0 - 17.0 g/dL   HCT 60.1 60.9 - 47.9 %   MCV 93.2 80.0 - 100.0 fL   MCH 31.9 26.0 - 34.0 pg   MCHC 34.2 30.0 - 36.0 g/dL   RDW 87.6 88.4 - 84.4 %   Platelets 168 150 - 400 K/uL   nRBC 0.0 0.0 - 0.2 %   Neutrophils Relative % 78 %   Neutro Abs 8.3 (H) 1.7 - 7.7 K/uL   Lymphocytes Relative 13 %   Lymphs Abs 1.4 0.7 - 4.0 K/uL   Monocytes Relative 9 %   Monocytes Absolute 1.0 0.1 - 1.0 K/uL   Eosinophils Relative 0 %   Eosinophils Absolute 0.0 0.0 - 0.5 K/uL   Basophils Relative 0 %   Basophils Absolute 0.0 0.0 - 0.1 K/uL   Immature Granulocytes 0 %   Abs Immature Granulocytes 0.03 0.00 - 0.07 K/uL    Comment: Performed at [facility], [address]  [place], [place] [address]  CBC     Status: None   Collection Time: 06/01/24  5:35 AM  Result Value Ref Range   WBC 8.2 4.0 - 10.5 K/uL   RBC 4.22 4.22 - 5.81 MIL/uL   Hemoglobin 13.3 13.0 - 17.0 g/dL   HCT 60.5 60.9 - 47.9 %   MCV 93.4 80.0 - 100.0 fL   MCH 31.5 26.0 - 34.0 pg   MCHC 33.8 30.0 - 36.0 g/dL   RDW 87.9 88.4 - 84.4 %   Platelets 166 150 - 400 K/uL   nRBC 0.0 0.0 - 0.2 %    Comment: Performed at [facility], [address], [place], [place] [address]  Basic metabolic panel     Status: Abnormal   Collection Time: 06/01/24  5:35 AM  Result Value Ref Range   Sodium 135 135 - 145 mmol/L   Potassium 4.4 3.5 - 5.1 mmol/L   Chloride 102 98 - 111 mmol/L   CO2 24 22 - 32 mmol/L   Glucose, Bld 104 (H) 70 - 99 mg/dL    Comment: Glucose reference range applies only to samples taken after fasting for at least 8 hours.   BUN 13 8 - 23 mg/dL   Creatinine, Ser 9.03 0.61 - 1.24 mg/dL   Calcium  9.2 8.9 - 10.3 mg/dL   GFR, Estimated >39 >39 mL/min    Comment: (NOTE) Calculated using the CKD-EPI Creatinine Equation (2021)    Anion gap 9 5 - 15    Comment: Performed at [facility], [address], [place], [place] [address]  Magnesium      Status: None   Collection Time: 06/01/24  5:35 AM  Result Value Ref Range   Magnesium  1.7 1.7 - 2.4 mg/dL    Comment: Performed at [facility], [address], [place], [place] [address]  Phosphorus     Status: None   Collection Time: 06/01/24  5:35 AM  Result Value Ref Range   Phosphorus 3.1 2.5 - 4.6 mg/dL    Comment: Performed at [facility], [address] [place], [place] [address]    DG Ankle Complete Right Result Date: 06/01/2024 CLINICAL DATA:  Status post fall with right ankle pain. EXAM: RIGHT ANKLE - COMPLETE 3+ VIEW COMPARISON:  None Available. FINDINGS: Mildly displaced distal fibular fracture just proximal to the ankle mortise. No additional fracture. No  mortise widening. No ankle joint effusion. Soft tissue edema is most prominent laterally. IMPRESSION: Mildly displaced distal fibular fracture. Electronically Signed   By: Andrea Gasman M.D.   On: 06/01/2024 14:23   CT Head Wo Contrast Result Date: 05/31/2024 CLINICAL DATA:  Head trauma, moderate-severe; Neck trauma (Age >= 65y) EXAM: CT HEAD WITHOUT CONTRAST CT CERVICAL SPINE WITHOUT CONTRAST TECHNIQUE: Multidetector CT imaging of the head and cervical spine was performed following the standard protocol without intravenous contrast. Multiplanar CT image reconstructions of the cervical spine were also generated. RADIATION DOSE REDUCTION: This exam was performed according to the departmental dose-optimization program which includes automated exposure control, adjustment of the mA and/or kV according to patient size and/or use of iterative reconstruction technique. COMPARISON:  CT head 05/07/2010 FINDINGS: CT HEAD FINDINGS Brain: Prominence of the lateral ventricles may be related to central predominant atrophy, although a component of normal pressure/communicating hydrocephalus cannot be excluded. Similar-appearing right parietooccipital lobe encephalomalacia. No evidence of large-territorial acute infarction. No parenchymal hemorrhage. No mass lesion. No extra-axial collection. No mass effect or midline shift. No hydrocephalus. Basilar cisterns are patent. Vascular: No hyperdense vessel. Skull: No acute fracture or focal lesion.  Sinuses/Orbits: Paranasal sinuses and mastoid air cells are clear. The orbits are unremarkable. Other: None. CT CERVICAL SPINE FINDINGS Alignment: Normal. Skull base and vertebrae: Multilevel moderate degenerative changes of the spine. No associated severe osseous neural foraminal or central canal stenosis. No acute fracture. No aggressive appearing focal osseous lesion or focal pathologic process. Soft tissues and spinal canal: No prevertebral fluid or swelling. No visible canal  hematoma. Upper chest: Right apical 9 x 8 mm pulmonary nodule. Biapical centrilobular and paraseptal emphysematous changes. Other: None. IMPRESSION: 1. No acute intracranial abnormality. 2. No acute displaced fracture or traumatic listhesis of the cervical spine. 3. Right apical 9 x 8 mm pulmonary nodule. Recommend outpatient CT chest for further evaluation. 4.  Emphysema (ICD10-J43.9). Electronically Signed   By: Morgane  Naveau M.D.   On: 05/31/2024 19:40   CT Cervical Spine Wo Contrast Result Date: 05/31/2024 CLINICAL DATA:  Head trauma, moderate-severe; Neck trauma (Age >= 65y) EXAM: CT HEAD WITHOUT CONTRAST CT CERVICAL SPINE WITHOUT CONTRAST TECHNIQUE: Multidetector CT imaging of the head and cervical spine was performed following the standard protocol without intravenous contrast. Multiplanar CT image reconstructions of the cervical spine were also generated. RADIATION DOSE REDUCTION: This exam was performed according to the departmental dose-optimization program which includes automated exposure control, adjustment of the mA and/or kV according to patient size and/or use of iterative reconstruction technique. COMPARISON:  CT head 05/07/2010 FINDINGS: CT HEAD FINDINGS Brain: Prominence of the lateral ventricles may be related to central predominant atrophy, although a component of normal pressure/communicating hydrocephalus cannot be excluded. Similar-appearing right parietooccipital lobe encephalomalacia. No evidence of large-territorial acute infarction. No parenchymal hemorrhage. No mass lesion. No extra-axial collection. No mass effect or midline shift. No hydrocephalus. Basilar cisterns are patent. Vascular: No hyperdense vessel. Skull: No acute fracture or focal lesion. Sinuses/Orbits: Paranasal sinuses and mastoid air cells are clear. The orbits are unremarkable. Other: None. CT CERVICAL SPINE FINDINGS Alignment: Normal. Skull base and vertebrae: Multilevel moderate degenerative changes of the spine.  No associated severe osseous neural foraminal or central canal stenosis. No acute fracture. No aggressive appearing focal osseous lesion or focal pathologic process. Soft tissues and spinal canal: No prevertebral fluid or swelling. No visible canal hematoma. Upper chest: Right apical 9 x 8 mm pulmonary nodule. Biapical centrilobular and paraseptal emphysematous changes. Other: None. IMPRESSION: 1. No acute intracranial abnormality. 2. No acute displaced fracture or traumatic listhesis of the cervical spine. 3. Right apical 9 x 8 mm pulmonary nodule. Recommend outpatient CT chest for further evaluation. 4.  Emphysema (ICD10-J43.9). Electronically Signed   By: Morgane  Naveau M.D.   On: 05/31/2024 19:40   DG Chest Portable 1 View Result Date: 05/31/2024 CLINICAL DATA:  Hypoxia EXAM: PORTABLE CHEST 1 VIEW COMPARISON:  Chest x-ray 03/29/2024 FINDINGS: The heart is enlarged. There is no focal lung infiltrate, pleural effusion or pneumothorax. No acute fractures are seen. IMPRESSION: Cardiomegaly. No acute pulmonary process.  The Electronically Signed   By: Greig Pique M.D.   On: 05/31/2024 18:14   DG Pelvis Portable Result Date: 05/31/2024 CLINICAL DATA:  Seizure EXAM: PORTABLE PELVIS 1-2 VIEWS COMPARISON:  01/10/2024 FINDINGS: Partially visualized left hip replacement with normal alignment. Pubic symphysis and rami appear intact. Mild right hip degenerative change. No definitive fracture or malalignment. IMPRESSION: No acute osseous abnormality. Electronically Signed   By: Luke Bun M.D.   On: 05/31/2024 18:13   I independently reviewed x-rays of the right ankle and it shows an intact ankle mortise with a nondisplaced  lateral malleolus fracture at the level of the ankle mortise.   Review of Systems Blood pressure 115/71, pulse 76, temperature 98.2 F (36.8 C), temperature source Oral, resp. rate 18, height 5' 9 (1.753 m), weight 77.6 kg, SpO2 98%. Physical Exam Vitals reviewed.  Constitutional:       Appearance: Normal appearance.  Musculoskeletal:     Right ankle: Tenderness present over the lateral malleolus.  Neurological:     Mental Status: He is alert. Mental status is at baseline.  Psychiatric:        Behavior: Behavior normal.     Assessment/Plan: Right ankle stable lateral malleolus fracture  Fortunately this is a stable fracture and can be treated with weightbearing as tolerated in a cam walking boot.  I talked with the patient and family at the bedside about this in length in detail.  Once he is allowed to be up with therapy, they can work on mobility and mobilizing him with again weightbearing as tolerated in the cam walking boot.  I will then need to see him back in the office in about 2 weeks for repeat x-rays of the right ankle.  If there are any issues at all do not hesitate to reach out and call or text me.  [name] 06/01/2024, 5:40 PM

## 2024-06-01 NOTE — Hospital Course (Signed)
 73 y.o. male with medical history significant for primary biliary cirrhosis, prior CVA, seizure disorder, dementia, presents to the ER with breakthrough seizures.  Seizures were witnessed by family members.  Reportedly, the patient has breakthrough seizures about once a month.  Has been compliant with his home AEDs.   In the ER, the patient had another witnessed breakthrough seizure.  EDP discussed the case with neurology, Dr. Michaela, who will see in consultation.  The patient received IV Ativan  1 mg x 2, loading dose of Keppra  1500 mg x 1 and home Dilantin  was restarted.  [facility], hospitalist service, was asked to admit.

## 2024-06-01 NOTE — H&P (Addendum)
 History and Physical  PAT SIRES FMW:[id number] DOB: 10-Nov-1950 DOA: 05/31/2024  Referring physician: Tinnie Matter, PA-EDP  PCP: Vicci Barnie NOVAK, MD  Outpatient Specialists: Neurology. Patient coming from: Home.  Chief Complaint: Breakthrough seizures  HPI: [name] is a 73 y.o. male with medical history significant for primary biliary cirrhosis, prior CVA, seizure disorder, dementia, presents to the ER with breakthrough seizures.  Seizures were witnessed by family members.  Reportedly, the patient has breakthrough seizures about once a month.  Has been compliant with his home AEDs.  Had tonic-clonic movements lasting several minutes.  Associated with urinary incontinence.  No reported subjective fevers or chills.  No reported recent illnesses or new neurological deficits.  In the ER, the patient had another witnessed breakthrough seizure.  EDP discussed the case with neurology, Dr. Michaela, who will see in consultation.  The patient received IV Ativan  1 mg x 2, loading dose of Keppra  1500 mg x 1 and home Dilantin  was restarted.  [facility], hospitalist service, was asked to admit.  At the time of this visit, the patient is postictal.  He responds to verbal stimuli and moves all 4 extremities actively.  No family members at bedside.  ED Course: Temperature 98.4.  BP 136/77, pulse 90, respiration rate 18, O2 saturation 100% on room air.  Lactic acid 0.8 alkaline phosphatase 146.  WBC 10.7.  UA negative for pyuria.  Review of Systems: Review of systems as noted in the HPI. All other systems reviewed and are negative.   Past Medical History:  Diagnosis Date   Alcoholism (HCC)    History of, not active   Chronic hepatitis C (HCC)    Dementia (HCC)    Diverticulosis    Gait disorder    Gingival hypertrophy    Secondary to Dilantin    Hemiparesis and alteration of sensations as late effects of stroke (HCC) 01/25/2017   Left hemiparesis   Hiatal hernia    History of alcoholism  (HCC)    Hx of ischemic vertebrobasilar artery thalamic stroke    Right   Internal hemorrhoids    Memory disorder 03/04/2013   Primary biliary cirrhosis (HCC)    Seizures (HCC)    Stroke (HCC)    Right frontal, right thalamic   Tubular adenoma of colon    Ulnar neuropathy of left upper extremity    Past Surgical History:  Procedure Laterality Date   COLONOSCOPY WITH PROPOFOL  N/A 07/10/2017   Procedure: COLONOSCOPY WITH PROPOFOL ;  Surgeon: Elicia Claw, MD;  Location: [facility];  Service: Gastroenterology;  Laterality: N/A;   ESOPHAGOGASTRODUODENOSCOPY (EGD) WITH PROPOFOL  N/A 07/10/2017   Procedure: ESOPHAGOGASTRODUODENOSCOPY (EGD) WITH PROPOFOL ;  Surgeon: Elicia Claw, MD;  Location: [facility];  Service: Gastroenterology;  Laterality: N/A;   HIP ARTHROPLASTY Left 11/26/2013   Procedure: LEFT HIP HEMIARTHROPLASTY;  Surgeon: [name], MD;  Location: [facility] OR;  Service: Orthopedics;  Laterality: Left;   WRIST SURGERY Left 95 & 96    Social History:  reports that he quit smoking about 10 years ago. His smoking use included cigarettes. He has quit using smokeless tobacco. He reports that he does not drink alcohol and does not use drugs.   No Known Allergies  Family History  Problem Relation Age of Onset   Pulmonary embolism Mother    Liver disease Father    Diabetes Sister    Hypertension Sister    Hypertension Sister    Hypertension Sister    Seizures Neg Hx  Prior to Admission medications   Medication Sig Start Date End Date Taking? Authorizing Provider  aspirin  EC 81 MG tablet Take 1 tablet (81 mg total) by mouth daily. 03/04/20  Yes Vicci Barnie NOVAK, MD  memantine  (NAMENDA ) 10 MG tablet Take 1 tablet (10 mg total) by mouth 2 (two) times daily. 02/19/24  Yes Vicci Barnie NOVAK, MD  phenytoin  (DILANTIN ) 100 MG ER capsule Take 2 capsules (200 mg total) by mouth at bedtime. FOR SEIZURES 02/19/24  Yes Vicci Barnie NOVAK, MD  PHENYTOIN  INFATABS 50 MG  tablet Chew 0.5 tablets (25 mg total) by mouth at bedtime. 02/19/24  Yes Vicci Barnie NOVAK, MD    Physical Exam: BP (!) 148/83   Pulse 90   Temp 98.4 F (36.9 C) (Oral)   Resp 18   Ht 5' 8 (1.727 m)   Wt 78.5 kg   SpO2 100%   BMI 26.31 kg/m   General: 73 y.o. year-old male well developed well nourished in no acute distress.  Somnolent, post ictal. Cardiovascular: Regular rate and rhythm with no rubs or gallops.  No thyromegaly or JVD noted.  No lower extremity edema. 2/4 pulses in all 4 extremities. Respiratory: Clear to auscultation with no wheezes or rales.  Poor inspiratory effort. Abdomen: Soft nontender nondistended with normal bowel sounds x4 quadrants. Muskuloskeletal: No cyanosis, clubbing or edema noted bilaterally Neuro: CN II-XII intact, strength, sensation, reflexes Skin: No ulcerative lesions noted or rashes Psychiatry: Judgement and insight appear altered.  Mood is appropriate for condition and setting          Labs on Admission:  Basic Metabolic Panel: Recent Labs  Lab 05/31/24 0549 05/31/24 1920  NA 137 135  K 4.5 4.2  CL 103 103  CO2 25 23  GLUCOSE 98 110*  BUN 21 15  CREATININE 1.07 1.03  CALCIUM  9.0 9.1   Liver Function Tests: Recent Labs  Lab 05/31/24 0549 05/31/24 1920  AST 16 18  ALT 12 14  ALKPHOS 147* 146*  BILITOT 0.4 0.7  PROT 7.7 7.2  ALBUMIN 3.8 3.7   No results for input(s): LIPASE, AMYLASE in the last 168 hours. No results for input(s): AMMONIA in the last 168 hours. CBC: Recent Labs  Lab 05/31/24 0549 05/31/24 2137  WBC 6.2 10.7*  NEUTROABS 4.3 8.3*  HGB 13.9 13.6  HCT 40.8 39.8  MCV 93.6 93.2  PLT 173 168   Cardiac Enzymes: No results for input(s): CKTOTAL, CKMB, CKMBINDEX, TROPONINI in the last 168 hours.  BNP (last 3 results) No results for input(s): BNP in the last 8760 hours.  ProBNP (last 3 results) No results for input(s): PROBNP in the last 8760 hours.  CBG: Recent Labs  Lab  05/31/24 0510 05/31/24 1752  GLUCAP 104* 93    Radiological Exams on Admission: CT Head Wo Contrast Result Date: 05/31/2024 CLINICAL DATA:  Head trauma, moderate-severe; Neck trauma (Age >= 65y) EXAM: CT HEAD WITHOUT CONTRAST CT CERVICAL SPINE WITHOUT CONTRAST TECHNIQUE: Multidetector CT imaging of the head and cervical spine was performed following the standard protocol without intravenous contrast. Multiplanar CT image reconstructions of the cervical spine were also generated. RADIATION DOSE REDUCTION: This exam was performed according to the departmental dose-optimization program which includes automated exposure control, adjustment of the mA and/or kV according to patient size and/or use of iterative reconstruction technique. COMPARISON:  CT head 05/07/2010 FINDINGS: CT HEAD FINDINGS Brain: Prominence of the lateral ventricles may be related to central predominant atrophy, although a component of normal pressure/communicating  hydrocephalus cannot be excluded. Similar-appearing right parietooccipital lobe encephalomalacia. No evidence of large-territorial acute infarction. No parenchymal hemorrhage. No mass lesion. No extra-axial collection. No mass effect or midline shift. No hydrocephalus. Basilar cisterns are patent. Vascular: No hyperdense vessel. Skull: No acute fracture or focal lesion. Sinuses/Orbits: Paranasal sinuses and mastoid air cells are clear. The orbits are unremarkable. Other: None. CT CERVICAL SPINE FINDINGS Alignment: Normal. Skull base and vertebrae: Multilevel moderate degenerative changes of the spine. No associated severe osseous neural foraminal or central canal stenosis. No acute fracture. No aggressive appearing focal osseous lesion or focal pathologic process. Soft tissues and spinal canal: No prevertebral fluid or swelling. No visible canal hematoma. Upper chest: Right apical 9 x 8 mm pulmonary nodule. Biapical centrilobular and paraseptal emphysematous changes. Other: None.  IMPRESSION: 1. No acute intracranial abnormality. 2. No acute displaced fracture or traumatic listhesis of the cervical spine. 3. Right apical 9 x 8 mm pulmonary nodule. Recommend outpatient CT chest for further evaluation. 4.  Emphysema (ICD10-J43.9). Electronically Signed   By: Morgane  Naveau M.D.   On: 05/31/2024 19:40   CT Cervical Spine Wo Contrast Result Date: 05/31/2024 CLINICAL DATA:  Head trauma, moderate-severe; Neck trauma (Age >= 65y) EXAM: CT HEAD WITHOUT CONTRAST CT CERVICAL SPINE WITHOUT CONTRAST TECHNIQUE: Multidetector CT imaging of the head and cervical spine was performed following the standard protocol without intravenous contrast. Multiplanar CT image reconstructions of the cervical spine were also generated. RADIATION DOSE REDUCTION: This exam was performed according to the departmental dose-optimization program which includes automated exposure control, adjustment of the mA and/or kV according to patient size and/or use of iterative reconstruction technique. COMPARISON:  CT head 05/07/2010 FINDINGS: CT HEAD FINDINGS Brain: Prominence of the lateral ventricles may be related to central predominant atrophy, although a component of normal pressure/communicating hydrocephalus cannot be excluded. Similar-appearing right parietooccipital lobe encephalomalacia. No evidence of large-territorial acute infarction. No parenchymal hemorrhage. No mass lesion. No extra-axial collection. No mass effect or midline shift. No hydrocephalus. Basilar cisterns are patent. Vascular: No hyperdense vessel. Skull: No acute fracture or focal lesion. Sinuses/Orbits: Paranasal sinuses and mastoid air cells are clear. The orbits are unremarkable. Other: None. CT CERVICAL SPINE FINDINGS Alignment: Normal. Skull base and vertebrae: Multilevel moderate degenerative changes of the spine. No associated severe osseous neural foraminal or central canal stenosis. No acute fracture. No aggressive appearing focal osseous lesion  or focal pathologic process. Soft tissues and spinal canal: No prevertebral fluid or swelling. No visible canal hematoma. Upper chest: Right apical 9 x 8 mm pulmonary nodule. Biapical centrilobular and paraseptal emphysematous changes. Other: None. IMPRESSION: 1. No acute intracranial abnormality. 2. No acute displaced fracture or traumatic listhesis of the cervical spine. 3. Right apical 9 x 8 mm pulmonary nodule. Recommend outpatient CT chest for further evaluation. 4.  Emphysema (ICD10-J43.9). Electronically Signed   By: Morgane  Naveau M.D.   On: 05/31/2024 19:40   DG Chest Portable 1 View Result Date: 05/31/2024 CLINICAL DATA:  Hypoxia EXAM: PORTABLE CHEST 1 VIEW COMPARISON:  Chest x-ray 03/29/2024 FINDINGS: The heart is enlarged. There is no focal lung infiltrate, pleural effusion or pneumothorax. No acute fractures are seen. IMPRESSION: Cardiomegaly. No acute pulmonary process.  The Electronically Signed   By: Greig Pique M.D.   On: 05/31/2024 18:14   DG Pelvis Portable Result Date: 05/31/2024 CLINICAL DATA:  Seizure EXAM: PORTABLE PELVIS 1-2 VIEWS COMPARISON:  01/10/2024 FINDINGS: Partially visualized left hip replacement with normal alignment. Pubic symphysis and rami appear intact. Mild right  hip degenerative change. No definitive fracture or malalignment. IMPRESSION: No acute osseous abnormality. Electronically Signed   By: Luke Bun M.D.   On: 05/31/2024 18:13    EKG: I independently viewed the EKG done and my findings are as followed: Sinus rhythm rate of 92.  Nonspecific ST-T changes QTc 419.  Assessment/Plan Present on Admission:  Breakthrough seizure [facility])  Principal Problem:   Breakthrough seizure (HCC)  Breakthrough seizures, unclear trigger History of seizure disorder and prior CVA Follow EEG Continue IV Keppra  until seen by neurology IV Ativan  as needed for breakthrough seizures. Resume home AEDs Continue seizure precautions, fall and aspiration  precautions  History of CVA Resume home aspirin   Dementia Resume home memantine   Generalized weakness PT OT evaluation Fall precautions  Elevated alkaline phosphatase, nonspecific Monitor for now   Time: 75 minutes.   DVT prophylaxis: Subcu Lovenox  daily.  Code Status: Full code, by default, no family members at bedside.  Family Communication: None at bedside.  Disposition Plan: Admitted to progressive care unit at [facility] from the [facility] ED.  Consults called: Neurology consulted by EDP.  Admission status: Inpatient status.   Status is: Inpatient The patient requires at least 2 midnights for further evaluation and treatment of present condition.   Terry LOISE Hurst MD [facility] Pager [phone]  If 7PM-7AM, please contact night-coverage [email or web address] Password [email or web address]  06/01/2024, 12:01 AM

## 2024-06-01 NOTE — Progress Notes (Signed)
  Progress Note   Patient: [name] FMW:[id number] DOB: [birth year] DOA: 05/31/2024     1 DOS: the patient was seen and examined on 06/01/2024   Brief hospital course: 73 y.o. male with medical history significant for primary biliary cirrhosis, prior CVA, seizure disorder, dementia, presents to the ER with breakthrough seizures.  Seizures were witnessed by family members.  Reportedly, the patient has breakthrough seizures about once a month.  Has been compliant with his home AEDs.   In the ER, the patient had another witnessed breakthrough seizure.  EDP discussed the case with neurology, Dr. Michaela, who will see in consultation.  The patient received IV Ativan  1 mg x 2, loading dose of Keppra  1500 mg x 1 and home Dilantin  was restarted.  TRH, hospitalist service, was asked to admit.   Assessment and Plan: Breakthrough seizures, unclear trigger History of seizure disorder and prior CVA Neurology consulted and following Neuro recs for continuin gkeppra 1500mg  bid, dilantin  225mg  at bedtime, and increase lacosamide  from 100mg  bid to 150mg  bid If no seizures by 8/12, then would be clear from Neuro standpoint for d/c  History of CVA cont home aspirin    Dementia Resume home memantine    Generalized weakness PT OT evaluation Fall precautions   Elevated alkaline phosphatase, nonspecific Monitor for now  R distal fibular fracture, displaced -Pt complained of R ankle pain, unable to bear wt -Ordered and reviewed R ankle xray. Finding notable for R distal fibular fracture -Consult Ortho for further recs   Subjective: Complaining of R ankle pain, unable to bear weight  Physical Exam: Vitals:   06/01/24 0517 06/01/24 0519 06/01/24 0905 06/01/24 1312  BP: 117/81  116/66 124/75  Pulse: 82  80 88  Resp: 20  20 18   Temp: 99.4 F (37.4 C)  99 F (37.2 C) 98.1 F (36.7 C)  TempSrc: Oral  Oral   SpO2: 100%  95% 95%  Weight:  77.6 kg    Height:  5' 9 (1.753 m)     General exam:  Awake, laying in bed, in nad Respiratory system: Normal respiratory effort, no wheezing Cardiovascular system: regular rate, s1, s2 Gastrointestinal system: Soft, nondistended, positive BS Central nervous system: CN2-12 grossly intact, strength intact Extremities: Perfused, no clubbing Skin: Normal skin turgor, no notable skin lesions seen Psychiatry: Mood normal // no visual hallucinations   Data Reviewed:  Labs reviewed: Na 135, K 4.4, Cr 0.96, WBC 8.2, Hgb 13.3, Plts 166  Family Communication: Pt in room, family not at bedside  Disposition: Status is: Observation The patient remains OBS appropriate and will d/c before 2 midnights.  Planned Discharge Destination: Home     Author: Garnette Pelt, MD 06/01/2024 3:17 PM  For on call review [email or web address].

## 2024-06-02 ENCOUNTER — Telehealth: Payer: Self-pay | Admitting: Nurse Practitioner

## 2024-06-02 ENCOUNTER — Other Ambulatory Visit (HOSPITAL_COMMUNITY): Payer: Self-pay

## 2024-06-02 ENCOUNTER — Other Ambulatory Visit (HOSPITAL_BASED_OUTPATIENT_CLINIC_OR_DEPARTMENT_OTHER): Payer: Self-pay

## 2024-06-02 DIAGNOSIS — G9341 Metabolic encephalopathy: Secondary | ICD-10-CM | POA: Diagnosis not present

## 2024-06-02 DIAGNOSIS — R569 Unspecified convulsions: Secondary | ICD-10-CM

## 2024-06-02 LAB — LEVETIRACETAM LEVEL: Levetiracetam Lvl: 5.2 ug/mL — ABNORMAL LOW (ref 10.0–40.0)

## 2024-06-02 MED ORDER — LACOSAMIDE 150 MG PO TABS: 150.0000 mg | ORAL_TABLET | Freq: Two times a day (BID) | ORAL | 0 refills | Status: AC

## 2024-06-02 MED ORDER — LACOSAMIDE 150 MG PO TABS
150.0000 mg | ORAL_TABLET | Freq: Two times a day (BID) | ORAL | 0 refills | Status: DC
  Filled 2024-06-02: qty 60, 30d supply, fill #0

## 2024-06-02 NOTE — Evaluation (Signed)
 Physical Therapy Evaluation Patient Details Name: [name] [name] MRN: [id number] DOB: [birth year] Today's Date: 06/02/2024  History of Present Illness  [name] [name] is a  73 year old gentleman who was admitted yesterday after having some type of seizure. complaining of right ankle pain today and an x-ray was ordered and performed of the right ankle showing a nondisplaced lateral malleolus fracture of the distal fibula. Ad[facility] to [facility]  PMH: ETOH, chronic hep C, dementia, gait disorder, CVA with L hemiparesis, seizures, L-THA, GERD  Clinical Impression     [name] admitted with above diagnosis.  [name] currently with functional limitations due to the deficits listed below (see [name] Problem List). [name] seated in recliner when [name] arrived. [name] agreeable to therapy session. R CAM boot donned. [name] assisted [name] to don L shoe for improved balance and stability with transfer tasks and gait. [name] required CGA and cues for sit to stand  from recliner, CGA and cues for posture, safety, direction, proper distance from RW for 250 feet in hallway. [name] returned to recliner and all needs in place. [name] is a poor historian and no family present to provide insight. [name] has L UE motor control, coordination and strength deficits secondary to late effects CVA and apparent cognitive impairment and will require 24/7 supervision and assist in home setting for safe transition and HH services. [name] will benefit from acute skilled [name] to increase their independence and safety with mobility to allow discharge.         If plan is discharge home, recommend the following: A little help with walking and/or transfers;A little help with bathing/dressing/bathroom;Assistance with cooking/housework;Assist for transportation;Help with stairs or ramp for entrance   Can travel by private vehicle        Equipment Recommendations Rolling walker (2 wheels)  Recommendations for Other Services       Functional Status Assessment Patient has had a recent decline in  their functional status and demonstrates the ability to make significant improvements in function in a reasonable and predictable amount of time.     Precautions / Restrictions Precautions Precautions: Fall Required Braces or Orthoses: Other Brace (CAM R LE)      Mobility  Bed Mobility               General bed mobility comments: [name] seated in recliner    Transfers Overall transfer level: Needs assistance Equipment used: Rolling walker (2 wheels) Transfers: Sit to/from Stand Sit to Stand: Contact guard assist           General transfer comment: min cues for push to stand, [name] donned L standard shoe for improved stability with standing and gait tasks with R CAM boot donned    Ambulation/Gait Ambulation/Gait assistance: Contact guard assist Gait Distance (Feet): 250 Feet Assistive device: Rolling walker (2 wheels) Gait Pattern/deviations: Step-to pattern, Antalgic, Trunk flexed Gait velocity: decreased     General Gait Details: sligtht antalgic pattern with B UE support at RW to offload R LE in stance phase with CAM boot donned, [name] indicated no real pain but he could feel it, min cues for posture, proper distance from RW and [name] demonstrating step almost through pattern  Stairs            Wheelchair Mobility     Tilt Bed    Modified Rankin (Stroke Patients Only)       Balance Overall balance assessment: History of Falls, Mild deficits observed, not formally tested  Pertinent Vitals/Pain Pain Assessment Pain Assessment: No/denies pain    Home Living Family/patient expects to be discharged to:: Private residence Living Arrangements: Children Available Help at Discharge: Family;Available 24 hours/day Type of Home: Apartment Home Access: Level entry       Home Layout: One level Home Equipment: Cane - single point Additional Comments: [name] appears to be a poor historian and no family  present and information on PLOF and home living obtained from prior intervention    Prior Function Prior Level of Function : Needs assist  Cognitive Assist : Mobility (cognitive);ADLs (cognitive) Mobility (Cognitive): Set up cues ADLs (Cognitive): Set up cues       Mobility Comments: uses a cane at baseline , able to  ambulate with SPC, no needed assistance ADLs Comments: able to get own shower     Extremity/Trunk Assessment        Lower Extremity Assessment Lower Extremity Assessment: RLE deficits/detail RLE Deficits / Details: ankle NT due to acute fx, remaining B LE WFL RLE Sensation: WNL    Cervical / Trunk Assessment Cervical / Trunk Assessment: Normal  Communication   Communication Communication: Impaired Factors Affecting Communication: Difficulty expressing self    Cognition Arousal: Alert Behavior During Therapy: WFL for tasks assessed/performed   [name] - Cognitive impairments: No family/caregiver present to determine baseline, History of cognitive impairments                         Following commands: Impaired Following commands impaired: Only follows one step commands consistently     Cueing       General Comments      Exercises     Assessment/Plan    [name] Assessment Patient needs continued [name] services  [name] Problem List Decreased strength;Decreased range of motion;Decreased activity tolerance;Decreased balance;Decreased mobility;Decreased cognition;Decreased coordination       [name] Treatment Interventions DME instruction;Gait training;Functional mobility training;Therapeutic activities;Therapeutic exercise;Balance training;Neuromuscular re-education;Cognitive remediation;Modalities    [name] Goals (Current goals can be found in the Care Plan section)  Acute [name] [name] Goals Patient Stated Goal: to go home [name] Goal Formulation: With patient Time For Goal Achievement: 06/16/24 Potential to Achieve Goals: Good    Frequency Min 3X/week      Co-evaluation               AM-PAC [name] 6 Clicks Mobility  Outcome Measure Help needed turning from your back to your side while in a flat bed without using bedrails?: None Help needed moving from lying on your back to sitting on the side of a flat bed without using bedrails?: A Little Help needed moving to and from a bed to a chair (including a wheelchair)?: A Little Help needed standing up from a chair using your arms (e.g., wheelchair or bedside chair)?: A Little Help needed to walk in hospital room?: A Little Help needed climbing 3-5 steps with a railing? : A Lot 6 Click Score: 18    End of Session Equipment Utilized During Treatment: Gait belt Activity Tolerance: Patient tolerated treatment well;No increased pain Patient left: in chair;with chair alarm set;with call bell/phone within reach Nurse Communication: Mobility status [name] Visit Diagnosis: Unsteadiness on feet (R26.81);Other abnormalities of gait and mobility (R26.89);Muscle weakness (generalized) (M62.81);History of falling (Z91.81);Difficulty in walking, not elsewhere classified (R26.2)    Time: 8840-8777 [name] Time Calculation (min) (ACUTE ONLY): 23 min   Charges:   [name] Evaluation $[name] Eval Low Complexity: 1 Low [name] Treatments $Gait Training: 8-22 mins [name]  General Charges $$ ACUTE [name] VISIT: 1 Visit         [name] [name], [name] [name]   [name] [name] 06/02/2024, 2:12 PM

## 2024-06-02 NOTE — TOC Transition Note (Signed)
 Transition of Care [facility]) - Discharge Note  Patient Details  Name: [name] MRN: [id number] Date of Birth: [birth year]  Transition of Care [facility]) CM/SW Contact:  [name] Aran, LCSW Phone Number: 06/02/2024, 3:35 PM  Clinical Narrative: PT/OT evaluations recommended HH and a rolling walker. CSW discussed recommendations with patient and nephew, [name]. CSW followed up with [name] with [facility] regarding recommendations. [facility] to deliver rolling walker to patient's room. CSW faxed HH orders to Dr. Annabella Rase with [facility] per [name]'s request. CSW notified nephew and patient of walker being delivered and [facility] being set up. Care management signing off.  Final next level of care: Home w Home Health Services Barriers to Discharge: Barriers Resolved  Patient Goals and CMS Choice Patient states their goals for this hospitalization and ongoing recovery are:: Get help CMS Medicare.gov Compare Post Acute Care list provided to:: Patient Represenative (must comment) Choice offered to / list presented to :  (Nephew ([name]))  Discharge Plan and Services Additional resources added to the After Visit Summary for         DME Arranged: Walker rolling DME Agency: Other - Comment ([facility]) HH Arranged: PT, OT HH Agency: Other - See comment ([facility]) Date HH Agency Contacted: 06/02/24 Representative spoke with at [facility] Agency: [name] ([facility])  Social Drivers of Health (SDOH) Interventions SDOH Screenings   Food Insecurity: No Food Insecurity (06/01/2024)  Housing: Low Risk  (06/01/2024)  Transportation Needs: No Transportation Needs (06/01/2024)  Utilities: Not At Risk (06/01/2024)  Alcohol Screen: Low Risk  (06/25/2023)  Depression (PHQ2-9): Low Risk  (06/25/2023)  Financial Resource Strain: Low Risk  (06/25/2023)  Physical Activity: Inactive (01/14/2024)  Social Connections: Socially Isolated (06/01/2024)  Stress: No Stress Concern Present (06/25/2023)  Tobacco Use:  Medium Risk (05/31/2024)  Health Literacy: Inadequate Health Literacy (01/14/2024)   Readmission Risk Interventions    06/01/2024   12:38 PM  Readmission Risk Prevention Plan  Transportation Screening Complete  Home Care Screening Complete  Medication Review (RN CM) Complete

## 2024-06-02 NOTE — Progress Notes (Signed)
 Mobility Specialist - Progress Note   06/02/24 1100  Mobility  Activity Pivoted/transferred from chair to bed  Level of Assistance Minimal assist, patient does 75% or more  Range of Motion/Exercises Active  LLE Weight Bearing Per Provider Order WBAT  Activity Response Tolerated well  Mobility visit 1 Mobility  Mobility Specialist Start Time (ACUTE ONLY) 1100  Mobility Specialist Stop Time (ACUTE ONLY) 1110  Mobility Specialist Time Calculation (min) (ACUTE ONLY) 10 min   Pt requested assistance back to bed. Was left in bed with all needs met. Call bell in reach and family in room.  Erminio Leos,  Mobility Specialist Can be reached via Secure Chat

## 2024-06-02 NOTE — Telephone Encounter (Signed)
 Scheduled appointments per staff message. Called and left a VM with the patients nephew with the appointment details for the patient.The appointments will also appear on the discharge paperwork.

## 2024-06-02 NOTE — Discharge Summary (Signed)
 Physician Discharge Summary   Patient: [name] MRN: [id number] DOB: [birth year]  Admit date:     05/31/2024  Discharge date: 06/02/24  Discharge Physician: Garnette Pelt   PCP: Vicci Barnie NOVAK, MD   Recommendations at discharge:    Follow up with PCP in 1-2 weeks Follow up with Orthopedic Surgery in 2 weeks Follow up with Neurology as scheduled  Discharge Diagnoses: Principal Problem:   Breakthrough seizure (HCC) Active Problems:   Closed nondisplaced fracture of lateral malleolus of right fibula  Resolved Problems:   * No resolved hospital problems. *  Hospital Course: 73 y.o. male with medical history significant for primary biliary cirrhosis, prior CVA, seizure disorder, dementia, presents to the ER with breakthrough seizures.  Seizures were witnessed by family members.  Reportedly, the patient has breakthrough seizures about once a month.  Has been compliant with his home AEDs.   In the ER, the patient had another witnessed breakthrough seizure.  EDP discussed the case with neurology, Dr. Michaela, who will see in consultation.  The patient received IV Ativan  1 mg x 2, loading dose of Keppra  1500 mg x 1 and home Dilantin  was restarted.  [facility], hospitalist service, was asked to admit.   Assessment and Plan: Breakthrough seizures, unclear trigger History of seizure disorder and prior CVA Neurology consulted  Neuro recs for continuin gkeppra 1500mg  bid, dilantin  225mg  at bedtime, and increase lacosamide  from 100mg  bid to 150mg  bid Remains without seizures F/u with Neuro as outpatient   History of CVA cont home aspirin    Dementia Resume home memantine    Generalized weakness PT OT consulted   Elevated alkaline phosphatase, nonspecific Monitor for now   R distal fibular fracture, displaced -Pt complained of R ankle pain, unable to bear wt -Ordered and reviewed R ankle xray. Finding notable for R distal fibular fracture -Orthopedic Surgery consulted. Recs for  weatbearing as tolerated in cam walking boot. F/u with Orthopedic Surgery in 2 weeks for repeat xrays       Consultants: Neurology, Orthopedic Surgery Procedures performed:   Disposition: Home Diet recommendation:  Regular diet DISCHARGE MEDICATION: Allergies as of 06/02/2024   No Known Allergies      Medication List     TAKE these medications    aspirin  EC 81 MG tablet Take 1 tablet (81 mg total) by mouth daily.   Lacosamide  150 MG Tabs Take 1 tablet (150 mg total) by mouth 2 (two) times daily. What changed:  medication strength how much to take   levETIRAcetam  750 MG tablet Commonly known as: KEPPRA  Take 1,500 mg by mouth 2 (two) times daily.   loratadine  10 MG tablet Commonly known as: CLARITIN  Take 10 mg by mouth daily.   memantine  10 MG tablet Commonly known as: NAMENDA  Take 1 tablet (10 mg total) by mouth 2 (two) times daily.   pantoprazole  40 MG tablet Commonly known as: PROTONIX  Take 40 mg by mouth daily.   phenytoin  100 MG ER capsule Commonly known as: DILANTIN  Take 2 capsules (200 mg total) by mouth at bedtime. FOR SEIZURES   Phenytoin  Infatabs 50 MG tablet Generic drug: phenytoin  Chew 0.5 tablets (25 mg total) by mouth at bedtime.   rosuvastatin  10 MG tablet Commonly known as: CRESTOR  Take 10 mg by mouth daily.   ursodiol  500 MG tablet Commonly known as: ACTIGALL  Take 500 mg by mouth 2 (two) times daily.               Durable Medical Equipment  (From admission,  onward)           Start     Ordered   06/02/24 1431  For home use only DME Walker rolling  Once       Question Answer Comment  Walker: With 5 Inch Wheels   Patient needs a walker to treat with the following condition Closed right fibular fracture      06/02/24 1431            Follow-up Information     Vicci Barnie NOVAK, MD Follow up in 2 week(s).   Specialty: Internal Medicine Why: Hospital follow up Contact information: [address] [place]  [address] [place] [address] [phone]         Vernetta Lonni GRADE, MD Follow up in 2 week(s).   Specialty: Orthopedic Surgery Why: Hospital follow up Contact information: [address]  [place] [address] [phone]                Discharge Exam: Fredricka Weights   05/31/24 1736 06/01/24 0519  Weight: 78.5 kg 77.6 kg   General exam: Awake, laying in bed, in nad Respiratory system: Normal respiratory effort, no wheezing Cardiovascular system: regular rate, s1, s2 Gastrointestinal system: Soft, nondistended, positive BS Central nervous system: CN2-12 grossly intact, strength intact Extremities: Perfused, no clubbing Skin: Normal skin turgor, no notable skin lesions seen Psychiatry: Mood normal // no visual hallucinations   Condition at discharge: fair  The results of significant diagnostics from this hospitalization (including imaging, microbiology, ancillary and laboratory) are listed below for reference.   Imaging Studies: DG Ankle Complete Right Result Date: 06/01/2024 CLINICAL DATA:  Status post fall with right ankle pain. EXAM: RIGHT ANKLE - COMPLETE 3+ VIEW COMPARISON:  None Available. FINDINGS: Mildly displaced distal fibular fracture just proximal to the ankle mortise. No additional fracture. No mortise widening. No ankle joint effusion. Soft tissue edema is most prominent laterally. IMPRESSION: Mildly displaced distal fibular fracture. Electronically Signed   By: Andrea Gasman M.D.   On: 06/01/2024 14:23   CT Head Wo Contrast Result Date: 05/31/2024 CLINICAL DATA:  Head trauma, moderate-severe; Neck trauma (Age >= 65y) EXAM: CT HEAD WITHOUT CONTRAST CT CERVICAL SPINE WITHOUT CONTRAST TECHNIQUE: Multidetector CT imaging of the head and cervical spine was performed following the standard protocol without intravenous contrast. Multiplanar CT image reconstructions of the cervical spine were also generated. RADIATION DOSE REDUCTION: This exam was performed  according to the departmental dose-optimization program which includes automated exposure control, adjustment of the mA and/or kV according to patient size and/or use of iterative reconstruction technique. COMPARISON:  CT head 05/07/2010 FINDINGS: CT HEAD FINDINGS Brain: Prominence of the lateral ventricles may be related to central predominant atrophy, although a component of normal pressure/communicating hydrocephalus cannot be excluded. Similar-appearing right parietooccipital lobe encephalomalacia. No evidence of large-territorial acute infarction. No parenchymal hemorrhage. No mass lesion. No extra-axial collection. No mass effect or midline shift. No hydrocephalus. Basilar cisterns are patent. Vascular: No hyperdense vessel. Skull: No acute fracture or focal lesion. Sinuses/Orbits: Paranasal sinuses and mastoid air cells are clear. The orbits are unremarkable. Other: None. CT CERVICAL SPINE FINDINGS Alignment: Normal. Skull base and vertebrae: Multilevel moderate degenerative changes of the spine. No associated severe osseous neural foraminal or central canal stenosis. No acute fracture. No aggressive appearing focal osseous lesion or focal pathologic process. Soft tissues and spinal canal: No prevertebral fluid or swelling. No visible canal hematoma. Upper chest: Right apical 9 x 8 mm pulmonary nodule. Biapical centrilobular and paraseptal  emphysematous changes. Other: None. IMPRESSION: 1. No acute intracranial abnormality. 2. No acute displaced fracture or traumatic listhesis of the cervical spine. 3. Right apical 9 x 8 mm pulmonary nodule. Recommend outpatient CT chest for further evaluation. 4.  Emphysema (ICD10-J43.9). Electronically Signed   By: Morgane  Naveau M.D.   On: 05/31/2024 19:40   CT Cervical Spine Wo Contrast Result Date: 05/31/2024 CLINICAL DATA:  Head trauma, moderate-severe; Neck trauma (Age >= 65y) EXAM: CT HEAD WITHOUT CONTRAST CT CERVICAL SPINE WITHOUT CONTRAST TECHNIQUE:  Multidetector CT imaging of the head and cervical spine was performed following the standard protocol without intravenous contrast. Multiplanar CT image reconstructions of the cervical spine were also generated. RADIATION DOSE REDUCTION: This exam was performed according to the departmental dose-optimization program which includes automated exposure control, adjustment of the mA and/or kV according to patient size and/or use of iterative reconstruction technique. COMPARISON:  CT head 05/07/2010 FINDINGS: CT HEAD FINDINGS Brain: Prominence of the lateral ventricles may be related to central predominant atrophy, although a component of normal pressure/communicating hydrocephalus cannot be excluded. Similar-appearing right parietooccipital lobe encephalomalacia. No evidence of large-territorial acute infarction. No parenchymal hemorrhage. No mass lesion. No extra-axial collection. No mass effect or midline shift. No hydrocephalus. Basilar cisterns are patent. Vascular: No hyperdense vessel. Skull: No acute fracture or focal lesion. Sinuses/Orbits: Paranasal sinuses and mastoid air cells are clear. The orbits are unremarkable. Other: None. CT CERVICAL SPINE FINDINGS Alignment: Normal. Skull base and vertebrae: Multilevel moderate degenerative changes of the spine. No associated severe osseous neural foraminal or central canal stenosis. No acute fracture. No aggressive appearing focal osseous lesion or focal pathologic process. Soft tissues and spinal canal: No prevertebral fluid or swelling. No visible canal hematoma. Upper chest: Right apical 9 x 8 mm pulmonary nodule. Biapical centrilobular and paraseptal emphysematous changes. Other: None. IMPRESSION: 1. No acute intracranial abnormality. 2. No acute displaced fracture or traumatic listhesis of the cervical spine. 3. Right apical 9 x 8 mm pulmonary nodule. Recommend outpatient CT chest for further evaluation. 4.  Emphysema (ICD10-J43.9). Electronically Signed   By:  Morgane  Naveau M.D.   On: 05/31/2024 19:40   DG Chest Portable 1 View Result Date: 05/31/2024 CLINICAL DATA:  Hypoxia EXAM: PORTABLE CHEST 1 VIEW COMPARISON:  Chest x-ray 03/29/2024 FINDINGS: The heart is enlarged. There is no focal lung infiltrate, pleural effusion or pneumothorax. No acute fractures are seen. IMPRESSION: Cardiomegaly. No acute pulmonary process.  The Electronically Signed   By: Greig Pique M.D.   On: 05/31/2024 18:14   DG Pelvis Portable Result Date: 05/31/2024 CLINICAL DATA:  Seizure EXAM: PORTABLE PELVIS 1-2 VIEWS COMPARISON:  01/10/2024 FINDINGS: Partially visualized left hip replacement with normal alignment. Pubic symphysis and rami appear intact. Mild right hip degenerative change. No definitive fracture or malalignment. IMPRESSION: No acute osseous abnormality. Electronically Signed   By: Luke Bun M.D.   On: 05/31/2024 18:13    Microbiology: Results for orders placed or performed during the hospital encounter of 01/09/24  Resp panel by RT-PCR (RSV, Flu A&B, Covid) Anterior Nasal Swab     Status: None   Collection Time: 01/09/24 11:14 PM   Specimen: Anterior Nasal Swab  Result Value Ref Range Status   SARS Coronavirus 2 by RT PCR NEGATIVE NEGATIVE Final    Comment: (NOTE) SARS-CoV-2 target nucleic acids are NOT DETECTED.  The SARS-CoV-2 RNA is generally detectable in upper respiratory specimens during the acute phase of infection. The lowest concentration of SARS-CoV-2 viral copies this assay can  detect is 138 copies/mL. A negative result does not preclude SARS-Cov-2 infection and should not be used as the sole basis for treatment or other patient management decisions. A negative result may occur with  improper specimen collection/handling, submission of specimen other than nasopharyngeal swab, presence of viral mutation(s) within the areas targeted by this assay, and inadequate number of viral copies(<138 copies/mL). A negative result must be combined  with clinical observations, patient history, and epidemiological information. The expected result is Negative.  Fact Sheet for Patients:  BloggerCourse.com  Fact Sheet for Healthcare Providers:  SeriousBroker.it  This test is no t yet approved or cleared by the [place]  FDA and  has been authorized for detection and/or diagnosis of SARS-CoV-2 by FDA under an Emergency Use Authorization (EUA). This EUA will remain  in effect (meaning this test can be used) for the duration of the COVID-19 declaration under Section 564(b)(1) of the Act, 21 U.S.C.section 360bbb-3(b)(1), unless the authorization is terminated  or revoked sooner.       Influenza A by PCR NEGATIVE NEGATIVE Final   Influenza B by PCR NEGATIVE NEGATIVE Final    Comment: (NOTE) The Xpert Xpress SARS-CoV-2/FLU/RSV plus assay is intended as an aid in the diagnosis of influenza from Nasopharyngeal swab specimens and should not be used as a sole basis for treatment. Nasal washings and aspirates are unacceptable for Xpert Xpress SARS-CoV-2/FLU/RSV testing.  Fact Sheet for Patients: BloggerCourse.com  Fact Sheet for Healthcare Providers: SeriousBroker.it  This test is not yet approved or cleared by the [place]  FDA and has been authorized for detection and/or diagnosis of SARS-CoV-2 by FDA under an Emergency Use Authorization (EUA). This EUA will remain in effect (meaning this test can be used) for the duration of the COVID-19 declaration under Section 564(b)(1) of the Act, 21 U.S.C. section 360bbb-3(b)(1), unless the authorization is terminated or revoked.     Resp Syncytial Virus by PCR NEGATIVE NEGATIVE Final    Comment: (NOTE) Fact Sheet for Patients: BloggerCourse.com  Fact Sheet for Healthcare Providers: SeriousBroker.it  This test is not yet approved  or cleared by the [place]  FDA and has been authorized for detection and/or diagnosis of SARS-CoV-2 by FDA under an Emergency Use Authorization (EUA). This EUA will remain in effect (meaning this test can be used) for the duration of the COVID-19 declaration under Section 564(b)(1) of the Act, 21 U.S.C. section 360bbb-3(b)(1), unless the authorization is terminated or revoked.  Performed at [facility], [address] [place], [place] [address]   Culture, blood (routine x 2)     Status: None   Collection Time: 01/09/24 11:35 PM   Specimen: BLOOD  Result Value Ref Range Status   Specimen Description   Final    BLOOD Performed at [facility], [address] [place], [place] [address]    Special Requests   Final    BOTTLES DRAWN AEROBIC AND ANAEROBIC Blood Culture results may not be optimal due to an inadequate volume of blood received in culture bottles Performed at [facility], [address], [place], [place] [address]    Culture   Final    NO GROWTH 5 DAYS Performed at [facility], [address], [place], [place] [address]    Report Status 01/15/2024 FINAL  Final  Culture, blood (routine x 2)     Status: None   Collection Time: 01/10/24 12:29 AM   Specimen: BLOOD  Result Value Ref Range Status  Specimen Description   Final    BLOOD LEFT ANTECUBITAL Performed at [facility], [address] [place], [place] [address]    Special Requests   Final    BOTTLES DRAWN AEROBIC AND ANAEROBIC Blood Culture adequate volume Performed at [facility], [address], [place], [place] [address]    Culture   Final    NO GROWTH 5 DAYS Performed at [facility], [address] [place], [place] [address]    Report Status 01/15/2024 FINAL  Final    Labs: CBC: Recent Labs  Lab 05/31/24 0549 05/31/24 2137 06/01/24 0535  WBC 6.2 10.7* 8.2  NEUTROABS 4.3 8.3*  --    HGB 13.9 13.6 13.3  HCT 40.8 39.8 39.4  MCV 93.6 93.2 93.4  PLT 173 168 166   Basic Metabolic Panel: Recent Labs  Lab 05/31/24 0549 05/31/24 1920 06/01/24 0535  NA 137 135 135  K 4.5 4.2 4.4  CL 103 103 102  CO2 25 23 24   GLUCOSE 98 110* 104*  BUN 21 15 13   CREATININE 1.07 1.03 0.96  CALCIUM  9.0 9.1 9.2  MG  --   --  1.7  PHOS  --   --  3.1   Liver Function Tests: Recent Labs  Lab 05/31/24 0549 05/31/24 1920  AST 16 18  ALT 12 14  ALKPHOS 147* 146*  BILITOT 0.4 0.7  PROT 7.7 7.2  ALBUMIN 3.8 3.7   CBG: Recent Labs  Lab 05/31/24 0510 05/31/24 1752  GLUCAP 104* 93    Discharge time spent: less than 30 minutes.  Signed: Garnette Pelt, MD [facility] 06/02/2024

## 2024-06-02 NOTE — Care Management Obs Status (Signed)
 MEDICARE OBSERVATION STATUS NOTIFICATION   Patient Details  Name: [name] MRN: [id number] Date of Birth: 02-12-1951   Medicare Observation Status Notification Given:  Yes    [name], LCSW 06/02/2024, 10:13 AM

## 2024-06-02 NOTE — Evaluation (Signed)
 Occupational Therapy Evaluation Patient Details Name: [name] MRN: [id number] DOB: 01/20/1951 Today's Date: 06/02/2024   History of Present Illness   [name] is a  73 year old gentleman who was admitted yesterday after having some type of seizure. complaining of right ankle pain today and an x-ray was ordered and performed of the right ankle showing a nondisplaced lateral malleolus fracture of the distal fibula. Admitted to [facility]  PMH: ETOH, chronic hep C, dementia, gait disorder, CVA with L hemiparesis, seizures, L-THA, GERD     Clinical Impressions PTA, patient lives at home with family at [facility] level with BADL's and mobility. Currently, patient presents with deficits outlined below (see OT Problem List for details) most significantly baseline L sided UE coordination deficits,L LE deficits, decreased cognition and safety, balance and activity tolerance limiting BADL's and functional mobility.  Anticipate with family assist and support, patient will require HHOT services including RW, TTB and BSC. Patient requires continued Acute care hospital level OT services to progress safety and functional performance and allow for discharge.       If plan is discharge home, recommend the following:   A little help with walking and/or transfers;A little help with bathing/dressing/bathroom;Assistance with cooking/housework;Direct supervision/assist for medications management;Direct supervision/assist for financial management;Assist for transportation;Help with stairs or ramp for entrance;Supervision due to cognitive status     Functional Status Assessment   Patient has had a recent decline in their functional status and demonstrates the ability to make significant improvements in function in a reasonable and predictable amount of time.     Equipment Recommendations   BSC/3in1;Tub/shower bench;Other (comment) (RW)      Precautions/Restrictions   Precautions Precautions:  Fall Required Braces or Orthoses: Other Brace Restrictions Weight Bearing Restrictions Per Provider Order: Yes LLE Weight Bearing Per Provider Order: Weight bearing as tolerated Other Position/Activity Restrictions: R LE CAM boot     Mobility Bed Mobility               General bed mobility comments: patient up n recliner and remained    Transfers Overall transfer level: Needs assistance Equipment used: Rolling walker (2 wheels) Transfers: Sit to/from Stand Sit to Stand: Contact guard assist           General transfer comment: min cues for hand placement and safety with Cam boot in place      Balance Overall balance assessment: History of Falls, Mild deficits observed, not formally tested                                         ADL either performed or assessed with clinical judgement   ADL Overall ADL's : Needs assistance/impaired Eating/Feeding: Set up;Sitting   Grooming: Wash/dry hands;Wash/dry face;Sitting;Set up   Upper Body Bathing: Contact guard assist;Sitting   Lower Body Bathing: Minimal assistance;Sit to/from stand   Upper Body Dressing : Contact guard assist;Sitting   Lower Body Dressing: Minimal assistance;Sit to/from stand   Toilet Transfer: Contact guard assist;Rolling walker (2 wheels)   Toileting- Clothing Manipulation and Hygiene: Contact guard assist;Sitting/lateral lean Toileting - Clothing Manipulation Details (indicate cue type and reason): use of urinal and toilet Tub/ Shower Transfer: Minimal assistance   Functional mobility during ADLs: Contact guard assist;Rolling walker (2 wheels) General ADL Comments: cues for safety and hand placement     Vision Baseline Vision/History: 0 No visual deficits  Pertinent Vitals/Pain Pain Assessment Pain Assessment: No/denies pain     Extremity/Trunk Assessment Upper Extremity Assessment Upper Extremity Assessment: Left hand dominant;LUE deficits/detail LUE  Coordination: decreased fine motor;decreased gross motor (hx of baseline hemiperesis)   Lower Extremity Assessment Lower Extremity Assessment: Defer to PT evaluation RLE Deficits / Details: ankle NT due to acute fx, remaining B LE WFL RLE Sensation: WNL   Cervical / Trunk Assessment Cervical / Trunk Assessment: Normal   Communication Communication Communication: Impaired Factors Affecting Communication: Difficulty expressing self   Cognition Arousal: Alert Behavior During Therapy: WFL for tasks assessed/performed                                 Following commands: Impaired Following commands impaired: Only follows one step commands consistently     Cueing  General Comments      no SOB or skin issues noted           Home Living Family/patient expects to be discharged to:: Private residence Living Arrangements: Children Available Help at Discharge: Family;Available 24 hours/day Type of Home: Apartment Home Access: Level entry     Home Layout: One level     Bathroom Shower/Tub: Tub/shower unit         Home Equipment: Cane - single point   Additional Comments: pt appears to be a poor historian and no family present and information on PLOF and home living obtained from prior intervention      Prior Functioning/Environment Prior Level of Function : Needs assist  Cognitive Assist : Mobility (cognitive);ADLs (cognitive) Mobility (Cognitive): Set up cues ADLs (Cognitive): Set up cues       Mobility Comments: uses a cane at baseline , able to  ambulate with SPC, no needed assistance ADLs Comments: able to get own shower    OT Problem List: Impaired balance (sitting and/or standing);Decreased activity tolerance;Decreased safety awareness;Decreased knowledge of use of DME or AE;Decreased knowledge of precautions;Impaired UE functional use   OT Treatment/Interventions: Self-care/ADL training;Therapeutic exercise;Neuromuscular education;Energy  conservation;DME and/or AE instruction;Therapeutic activities;Cognitive remediation/compensation;Patient/family education;Balance training      OT Goals(Current goals can be found in the care plan section)   Acute Rehab OT Goals Patient Stated Goal: to go home soon OT Goal Formulation: With patient Time For Goal Achievement: 06/16/24 Potential to Achieve Goals: Good   OT Frequency:  Min 2X/week       AM-PAC OT 6 Clicks Daily Activity     Outcome Measure Help from another person eating meals?: A Little Help from another person taking care of personal grooming?: A Little Help from another person toileting, which includes using toliet, bedpan, or urinal?: A Little Help from another person bathing (including washing, rinsing, drying)?: A Little Help from another person to put on and taking off regular upper body clothing?: A Little Help from another person to put on and taking off regular lower body clothing?: A Little 6 Click Score: 18   End of Session Equipment Utilized During Treatment: Gait belt;Rolling walker (2 wheels) Nurse Communication: Mobility status;Weight bearing status  Activity Tolerance: Patient tolerated treatment well Patient left: in chair;with call bell/phone within reach;with chair alarm set  OT Visit Diagnosis: Unsteadiness on feet (R26.81);History of falling (Z91.81);Muscle weakness (generalized) (M62.81);Cognitive communication deficit (R41.841) Symptoms and signs involving cognitive functions: Other cerebrovascular disease                Time: 8684-8662 OT Time Calculation (min): 22 min Charges:  OT General Charges $OT Visit: 1 Visit OT Evaluation $OT Eval Low Complexity: 1 Low  Zorianna Taliaferro OT/L Acute Rehabilitation Department  [phone]  06/02/2024, 3:39 PM

## 2024-06-03 ENCOUNTER — Telehealth: Payer: Self-pay | Admitting: *Deleted

## 2024-06-03 ENCOUNTER — Telehealth: Payer: Self-pay

## 2024-06-03 ENCOUNTER — Ambulatory Visit (HOSPITAL_COMMUNITY): Payer: Self-pay

## 2024-06-03 NOTE — Transitions of Care (Post Inpatient/ED Visit) (Addendum)
 06/03/2024  Name: [name] MRN: [id number] DOB: 01-20-1951  Today's TOC FU Call Status: Today's TOC FU Call Status:: Successful TOC FU Call Completed TOC FU Call Complete Date: 06/03/24 Patient's Name and Date of Birth confirmed.  Transition Care Management Follow-up Telephone Call Date of Discharge: 06/02/24 Discharge Facility: [facility]) Type of Discharge: Inpatient Admission Primary Inpatient Discharge Diagnosis:: Breakthrough seizure [facility])  and Closed nondisplaced fracture of lateral malleolus of right fibula How have you been since you were released from the hospital?: Same Any questions or concerns?: No  Items Reviewed: Did you receive and understand the discharge instructions provided?: Yes Medications obtained,verified, and reconciled?: No Medications Not Reviewed Reasons:: Other: (Patient did not have any new medication added to his regimen at this hospital stay. His nephew said he had no questions and was very familar with all of the patient's medications as he is the one that fills his medication box.) Any new allergies since your discharge?: No Dietary orders reviewed?: Yes Type of Diet Ordered:: heart healthy Do you have support at home?: Yes People in Home [RPT]: sibling(s), other relative(s) Name of Support/Comfort Primary Source: Patient sister and Nephew along with other family members assist patient  Medications Reviewed Today: Medications Reviewed Today   Medications were not reviewed in this encounter     Home Care and Equipment/Supplies: Were Home Health Services Ordered?: No Any new equipment or medical supplies ordered?: Yes Name of Medical supply agency?: walker and shoe boot Were you able to get the equipment/medical supplies?: Yes Do you have any questions related to the use of the equipment/supplies?: No  Functional Questionnaire: Do you need assistance with bathing/showering or dressing?: Yes (Nephew said that patient needs hel at  times and other time he can do it himself) Do you need assistance with meal preparation?: Yes Do you need assistance with eating?: No Do you have difficulty maintaining continence: Yes Do you need assistance with getting out of bed/getting out of a chair/moving?: Yes (Nephew said that patient needs hel at times and other time he can do it himself) Do you have difficulty managing or taking your medications?: Yes (Nephew assists with medications)  Follow up appointments reviewed: PCP Follow-up appointment confirmed?:  (Patient is not a patient of [facility] any longer . [facility] with [name]) Specialist Hospital Follow-up appointment confirmed?: No Reason Specialist Follow-Up Not Confirmed: Patient has Specialist Provider Number and will Call for Appointment (Nephew will call to get an appointment with Follow up with [name] in 2 weeks per discharge instructions) Do you need transportation to your follow-up appointment?: No Do you understand care options if your condition(s) worsen?: Yes-patient verbalized understanding   Spoke with patient's Nephew [name]. Joshua verified that  [facility] is now with [name]. Joshua said that he has been trying to reach someone at [facility] because he has concerns about the patient. Patient will not keep on the boot  that was placed due to broken ankle and is being really non-complaint with post hospital orders and feels it is due to his dementia. Joshua said that prior to  D/C from the hospital the family requested that the patient go to rehab until he can better walk and balance himself.  Joshua said no one at [name] has returned his call so they may discuss.  Advised that I would call [facility] and let them know all that was dicussed in this  call and request that someone call  him back as soon as possible.   SIGNATURE: Calton Daring , RN

## 2024-06-03 NOTE — Telephone Encounter (Signed)
 Urgent referrals placed to hepatology and and hepatobiliary surgery via [facility]

## 2024-06-03 NOTE — Telephone Encounter (Signed)
-----   Message from Gordy HERO Pyrtle sent at 05/31/2024 10:56 AM EDT ----- Regarding: FW: New Referral for Liver [facility] nursing team: Patient needs to be referred to [facility] hepatology and [facility] hepatobiliary surgery Dr. Nadara for [facility] with [facility] Urgent referral please JMP ----- Message ----- From: Dasie Leonor CROME, MD Sent: 05/29/2024   6:00 PM EDT To: Gordy HERO Starch, MD; Forestine JAYSON Raider, RN; Ya# Subject: RE: New Referral for Liver                     He should be referred to a transplant center since he is a cirrhotic with HCC. Since it is LIRADS-5 I would not biopsy.  Thanks, Leonor ----- Message ----- From: Lanny Callander, MD Sent: 05/29/2024  11:58 AM EDT To: Leonor CROME Dasie, MD; Gordy HERO Starch, MD; Mirjan# Subject: RE: New Referral for Liver                     Tully,   Do we have tumor board next week? If yes, please add him on.   Leonor, I reviewed his chart. He has PSC and liver cirrhosis, now has 3.2cm liver lesion in 4A/8, LI-RIDS-5, but AFP normal. It appears central to me, not sure if you will offer surgery, or transplant is a better options. I will hold of the biopsy, what do you think?  Gordy and [place], I may not be able to see him next week, but will get him in the week after, and will coordinate his care with surgeons. Let's see what Leonor says.  Thanks  Callander ----- Message ----- From: Raider Forestine JAYSON, RN Sent: 05/29/2024  11:11 AM EDT To: Callander Lanny, MD; Evalene CROME Rich, RN Subject: New Referral for Liver                         Dr. Lanny,   This is an urgent referral. Patient is scheduled for a bx on 8/14, referring office is hoping Onc. Will see patient prior to biopsy. Dr Pamula recommendation to have oncology see patient for their opinion on liver biopsy prior to moving forward with scheduled 06/04/24 procedure. Advised that we have placed an urgent referral to oncology so they can hopefully see patient before 8/14. However, if they are unable to get him in before that, we will  move liver biopsy out until after their appointment.   04/08/24 MRI Abdomen IMPRESSION: Suspicious liver observation consistent with hepatocellular carcinoma until proven otherwise. (LI-RADS 5). Recommend tissue sampling.   No other suspicious findings to suggest metastatic disease within the field of view.   Scattered liver cysts.   [facility] available. Please advise.  Thank you,  Colene

## 2024-06-04 ENCOUNTER — Ambulatory Visit (HOSPITAL_COMMUNITY)

## 2024-06-07 ENCOUNTER — Other Ambulatory Visit: Payer: Self-pay | Admitting: Neurology

## 2024-06-08 NOTE — Telephone Encounter (Signed)
 Requested Prescriptions   Pending Prescriptions Disp Refills   Lacosamide  100 MG TABS [Pharmacy Med Name: LACOSAMIDE  100 MG TABLET] 60 tablet     Sig: TAKE 1 TABLET (100 MG TOTAL) BY MOUTH IN THE MORNING AND AT BEDTIME.   Last seen 11/22/23 Next appt 09/10/24 Dispenses   Dispensed Days Supply Quantity Provider Pharmacy  LACOSAMIDE  100 MG TABLET 04/27/2024 30 60 each Camara, Amadou, MD [facility] [phone] - [facility]...  LACOSAMIDE  100 MG TABLET 03/28/2024 30 60 each Camara, Amadou, MD [facility] [phone] - G...  LACOSAMIDE  150 MG TABLET 03/18/2024 30 60 each Will Almarie MATSU, MD [facility] [phone] - G...  LACOSAMIDE  100 MG TABLET 02/28/2024 30 60 each Camara, Amadou, MD [facility] [phone] - G...  LACOSAMIDE  150 MG TABLET 02/13/2024 30 60 each Will Almarie MATSU, MD [facility] [phone] - [facility]...  LACOSAMIDE  100 MG TABLET 01/28/2024 30 60 each Camara, Amadou, MD [facility] [phone] - G...  LACOSAMIDE  150 MG TABLET 01/12/2024 30 60 each Will Almarie MATSU, MD [facility] [phone] - G...  LACOSAMIDE  100 MG TABLET 12/24/2023 30 60 each Camara, Amadou, MD [facility] [phone] - G...  LACOSAMIDE  100 MG TABLET 11/22/2023 30 60 each Camara, Amadou, MD [facility] [phone] - G...  LACOSAMIDE  100 MG TABLET 09/12/2023 56 70 each Kommor, Lum, MD [facility] [phone] - G.SABRASABRA

## 2024-06-08 NOTE — Progress Notes (Signed)
 Image storage order placed for   04/08/2024  MR Abdomen  12/29/2023  CT Abd/Pel   Also placed order for [facility] rad interpretation of MR   Allean Mallick, FNP-C Department of Surgery  Abdominal Transplant Division

## 2024-06-09 NOTE — Telephone Encounter (Signed)
 Patient is scheduled to see Dr Nadara at [facility] Hepatobiliary on 06/16/24.

## 2024-06-10 ENCOUNTER — Inpatient Hospital Stay: Payer: Medicare (Managed Care)

## 2024-06-10 ENCOUNTER — Other Ambulatory Visit: Payer: Self-pay

## 2024-06-10 ENCOUNTER — Inpatient Hospital Stay: Payer: Medicare (Managed Care) | Attending: Nurse Practitioner | Admitting: Nurse Practitioner

## 2024-06-10 NOTE — Progress Notes (Signed)
 PATIENT NAVIGATOR PROGRESS NOTE  Name: [name] Date: 06/10/2024 MRN: [id number]  DOB: 1951/02/01   Reason for visit:  Initial Med/Onc Appt  Comments:   Patient did not show to his appointment today.  Called and left voice message with Lorella (nephew) to call office to reschedule appt.    Per care everywhere, patient has been referred to [facility] and has an appt with Dr. Nadara at [facility] hepatobiliary on 06/16/24.    Patient's case was discussed during the GI Multidisciplinary Conference earlier today.  Recommendation was to proceed with transplant work-up.  Med/Onc will be available if patient is NAC or defers surgery.    Time spent counseling/coordinating care: 15-30 minutes

## 2024-06-10 NOTE — Progress Notes (Deleted)
 [facility]  [phone]   HEMATOLOGY ONCOLOGY INPATIENT CONSULTATION   [name]  DOB: 08/17/1951  MR#: [id number]  CSN#: [id number]    Requesting Physician: [facility]  Patient Care Team: Vicci Barnie NOVAK, MD as PCP - General (Internal Medicine) Vicci Barnie NOVAK, MD (Internal Medicine)  Reason for consult:   History of present illness:     MEDICAL HISTORY:  Past Medical History:  Diagnosis Date   Alcoholism [facility])    History of, not active   Chronic hepatitis C (HCC)    Dementia (HCC)    Diverticulosis    Gait disorder    Gingival hypertrophy    Secondary to Dilantin    Hemiparesis and alteration of sensations as late effects of stroke (HCC) 01/25/2017   Left hemiparesis   Hiatal hernia    History of alcoholism (HCC)    Hx of ischemic vertebrobasilar artery thalamic stroke    Right   Internal hemorrhoids    Memory disorder 03/04/2013   Primary biliary cirrhosis (HCC)    Seizures (HCC)    Stroke (HCC)    Right frontal, right thalamic   Tubular adenoma of colon    Ulnar neuropathy of left upper extremity     SURGICAL HISTORY: Past Surgical History:  Procedure Laterality Date   COLONOSCOPY WITH PROPOFOL  N/A 07/10/2017   Procedure: COLONOSCOPY WITH PROPOFOL ;  Surgeon: Elicia Claw, MD;  Location: [facility] ENDOSCOPY;  Service: Gastroenterology;  Laterality: N/A;   ESOPHAGOGASTRODUODENOSCOPY (EGD) WITH PROPOFOL  N/A 07/10/2017   Procedure: ESOPHAGOGASTRODUODENOSCOPY (EGD) WITH PROPOFOL ;  Surgeon: Elicia Claw, MD;  Location: [facility] ENDOSCOPY;  Service: Gastroenterology;  Laterality: N/A;   HIP ARTHROPLASTY Left 11/26/2013   Procedure: LEFT HIP HEMIARTHROPLASTY;  Surgeon: [name], MD;  Location: [facility] OR;  Service: Orthopedics;  Laterality: Left;   WRIST SURGERY Left 95 & 96    SOCIAL HISTORY: Social History   Socioeconomic History   Marital status: Divorced    Spouse name: Not on file   Number of children: 0    Years of education: 10   Highest education level: Not on file  Occupational History   Not on file  Tobacco Use   Smoking status: Former    Current packs/day: 0.00    Types: Cigarettes    Quit date: 01/02/2014    Years since quitting: 10.4   Smokeless tobacco: Former  Building services engineer status: Never Used  Substance and Sexual Activity   Alcohol use: No    Alcohol/week: 0.0 standard drinks of alcohol    Comment: Former Alcoholic   Drug use: No   Sexual activity: Yes  Other Topics Concern   Not on file  Social History Narrative   ** Merged History Encounter **       Patient is single and lives with a friend and her son. Patient does not have any children. Patient is disabled. Patient has a 10 th grade education. Patient is left-handed. Patient drinks some soda but not everyday.   Social Drivers of Corporate investment banker Strain: Low Risk  (06/25/2023)   Overall Financial Resource Strain (CARDIA)    Difficulty of Paying Living Expenses: Not hard at all  Food Insecurity: No Food Insecurity (06/01/2024)   Hunger Vital Sign    Worried About Running Out of Food in the Last Year: Never true    Ran Out of Food in the Last Year: Never true  Transportation Needs: No Transportation Needs (06/01/2024)   PRAPARE -  Administrator, Civil Service (Medical): No    Lack of Transportation (Non-Medical): No  Physical Activity: Inactive (01/14/2024)   Exercise Vital Sign    Days of Exercise per Week: 0 days    Minutes of Exercise per Session: 0 min  Stress: No Stress Concern Present (06/25/2023)   [employer] of Occupational Health - Occupational Stress Questionnaire    Feeling of Stress : Not at all  Social Connections: Socially Isolated (06/01/2024)   Social Connection and Isolation Panel    Frequency of Communication with Friends and Family: Never    Frequency of Social Gatherings with Friends and Family: More than three times a week    Attends Religious Services:  Never    Database administrator or Organizations: No    Attends Banker Meetings: Never    Marital Status: Divorced  Catering manager Violence: Not At Risk (06/01/2024)   Humiliation, Afraid, Rape, and Kick questionnaire    Fear of Current or Ex-Partner: No    Emotionally Abused: No    Physically Abused: No    Sexually Abused: No    FAMILY HISTORY: Family History  Problem Relation Age of Onset   Pulmonary embolism Mother    Liver disease Father    Diabetes Sister    Hypertension Sister    Hypertension Sister    Hypertension Sister    Seizures Neg Hx     ALLERGIES:  has no known allergies.  MEDICATIONS:  Current Outpatient Medications  Medication Sig Dispense Refill   aspirin  EC 81 MG tablet Take 1 tablet (81 mg total) by mouth daily. 100 tablet 0   Lacosamide  100 MG TABS Take 1 tablet (100 mg total) by mouth in the morning and at bedtime. 180 tablet 3   Lacosamide  150 MG TABS Take 1 tablet (150 mg total) by mouth 2 (two) times daily. 60 tablet 0   levETIRAcetam  (KEPPRA ) 750 MG tablet Take 1,500 mg by mouth 2 (two) times daily.     loratadine  (CLARITIN ) 10 MG tablet Take 10 mg by mouth daily.     memantine  (NAMENDA ) 10 MG tablet Take 1 tablet (10 mg total) by mouth 2 (two) times daily. 180 tablet 0   pantoprazole  (PROTONIX ) 40 MG tablet Take 40 mg by mouth daily.     phenytoin  (DILANTIN ) 100 MG ER capsule Take 2 capsules (200 mg total) by mouth at bedtime. FOR SEIZURES 180 capsule 2   PHENYTOIN  INFATABS 50 MG tablet Chew 0.5 tablets (25 mg total) by mouth at bedtime. 45 tablet 1   rosuvastatin  (CRESTOR ) 10 MG tablet Take 10 mg by mouth daily.     ursodiol  (ACTIGALL ) 500 MG tablet Take 500 mg by mouth 2 (two) times daily.     No current facility-administered medications for this visit.    REVIEW OF SYSTEMS:   Constitutional: Denies fevers, chills or abnormal night sweats Eyes: Denies blurriness of vision, double vision or watery eyes Ears, nose, mouth, throat,  and face: Denies mucositis or sore throat Respiratory: Denies cough, dyspnea or wheezes Cardiovascular: Denies palpitation, chest discomfort or lower extremity swelling Gastrointestinal:  Denies nausea, heartburn or change in bowel habits Skin: Denies abnormal skin rashes Lymphatics: Denies new lymphadenopathy or easy bruising Neurological:Denies numbness, tingling or new weaknesses Behavioral/Psych: Mood is stable, no new changes  All other systems were reviewed with the patient and are negative.  PHYSICAL EXAMINATION: ECOG PERFORMANCE STATUS: {CHL ONC ECOG [phone]}  There were no vitals filed for this visit.  There were no vitals filed for this visit.  GENERAL:alert, no distress and comfortable SKIN: skin color, texture, turgor are normal, no rashes or significant lesions EYES: normal, conjunctiva are pink and non-injected, sclera clear OROPHARYNX:no exudate, no erythema and lips, buccal mucosa, and tongue normal  NECK: supple, thyroid  normal size, non-tender, without nodularity LYMPH:  no palpable lymphadenopathy in the cervical, axillary or inguinal LUNGS: clear to auscultation and percussion with normal breathing effort HEART: regular rate & rhythm and no murmurs and no lower extremity edema ABDOMEN:abdomen soft, non-tender and normal bowel sounds Musculoskeletal:no cyanosis of digits and no clubbing  PSYCH: alert & oriented x 3 with fluent speech NEURO: no focal motor/sensory deficits  LABORATORY DATA:  I have reviewed the data as listed Lab Results  Component Value Date   WBC 8.2 06/01/2024   HGB 13.3 06/01/2024   HCT 39.4 06/01/2024   MCV 93.4 06/01/2024   PLT 166 06/01/2024   Recent Labs    05/14/24 1208 05/31/24 0549 05/31/24 1920 06/01/24 0535  NA 141 137 135 135  K 4.6 4.5 4.2 4.4  CL 105 103 103 102  CO2 31 25 23 24   GLUCOSE 99 98 110* 104*  BUN 13 21 15 13   CREATININE 1.01 1.07 1.03 0.96  CALCIUM  9.3 9.0 9.1 9.2  GFRNONAA  --  >60 >60 >60  PROT  7.4 7.7 7.2  --   ALBUMIN 4.3 3.8 3.7  --   AST 13 16 18   --   ALT 15 12 14   --   ALKPHOS 163* 147* 146*  --   BILITOT 0.6 0.4 0.7  --     RADIOGRAPHIC STUDIES: I have personally reviewed the radiological images as listed and agreed with the findings in the report. DG Ankle Complete Right Result Date: 06/01/2024 CLINICAL DATA:  Status post fall with right ankle pain. EXAM: RIGHT ANKLE - COMPLETE 3+ VIEW COMPARISON:  None Available. FINDINGS: Mildly displaced distal fibular fracture just proximal to the ankle mortise. No additional fracture. No mortise widening. No ankle joint effusion. Soft tissue edema is most prominent laterally. IMPRESSION: Mildly displaced distal fibular fracture. Electronically Signed   By: Andrea Gasman M.D.   On: 06/01/2024 14:23   CT Head Wo Contrast Result Date: 05/31/2024 CLINICAL DATA:  Head trauma, moderate-severe; Neck trauma (Age >= 65y) EXAM: CT HEAD WITHOUT CONTRAST CT CERVICAL SPINE WITHOUT CONTRAST TECHNIQUE: Multidetector CT imaging of the head and cervical spine was performed following the standard protocol without intravenous contrast. Multiplanar CT image reconstructions of the cervical spine were also generated. RADIATION DOSE REDUCTION: This exam was performed according to the departmental dose-optimization program which includes automated exposure control, adjustment of the mA and/or kV according to patient size and/or use of iterative reconstruction technique. COMPARISON:  CT head 05/07/2010 FINDINGS: CT HEAD FINDINGS Brain: Prominence of the lateral ventricles may be related to central predominant atrophy, although a component of normal pressure/communicating hydrocephalus cannot be excluded. Similar-appearing right parietooccipital lobe encephalomalacia. No evidence of large-territorial acute infarction. No parenchymal hemorrhage. No mass lesion. No extra-axial collection. No mass effect or midline shift. No hydrocephalus. Basilar cisterns are patent.  Vascular: No hyperdense vessel. Skull: No acute fracture or focal lesion. Sinuses/Orbits: Paranasal sinuses and mastoid air cells are clear. The orbits are unremarkable. Other: None. CT CERVICAL SPINE FINDINGS Alignment: Normal. Skull base and vertebrae: Multilevel moderate degenerative changes of the spine. No associated severe osseous neural foraminal or central canal stenosis. No acute fracture. No aggressive appearing focal osseous lesion  or focal pathologic process. Soft tissues and spinal canal: No prevertebral fluid or swelling. No visible canal hematoma. Upper chest: Right apical 9 x 8 mm pulmonary nodule. Biapical centrilobular and paraseptal emphysematous changes. Other: None. IMPRESSION: 1. No acute intracranial abnormality. 2. No acute displaced fracture or traumatic listhesis of the cervical spine. 3. Right apical 9 x 8 mm pulmonary nodule. Recommend outpatient CT chest for further evaluation. 4.  Emphysema (ICD10-J43.9). Electronically Signed   By: Morgane  Naveau M.D.   On: 05/31/2024 19:40   CT Cervical Spine Wo Contrast Result Date: 05/31/2024 CLINICAL DATA:  Head trauma, moderate-severe; Neck trauma (Age >= 65y) EXAM: CT HEAD WITHOUT CONTRAST CT CERVICAL SPINE WITHOUT CONTRAST TECHNIQUE: Multidetector CT imaging of the head and cervical spine was performed following the standard protocol without intravenous contrast. Multiplanar CT image reconstructions of the cervical spine were also generated. RADIATION DOSE REDUCTION: This exam was performed according to the departmental dose-optimization program which includes automated exposure control, adjustment of the mA and/or kV according to patient size and/or use of iterative reconstruction technique. COMPARISON:  CT head 05/07/2010 FINDINGS: CT HEAD FINDINGS Brain: Prominence of the lateral ventricles may be related to central predominant atrophy, although a component of normal pressure/communicating hydrocephalus cannot be excluded.  Similar-appearing right parietooccipital lobe encephalomalacia. No evidence of large-territorial acute infarction. No parenchymal hemorrhage. No mass lesion. No extra-axial collection. No mass effect or midline shift. No hydrocephalus. Basilar cisterns are patent. Vascular: No hyperdense vessel. Skull: No acute fracture or focal lesion. Sinuses/Orbits: Paranasal sinuses and mastoid air cells are clear. The orbits are unremarkable. Other: None. CT CERVICAL SPINE FINDINGS Alignment: Normal. Skull base and vertebrae: Multilevel moderate degenerative changes of the spine. No associated severe osseous neural foraminal or central canal stenosis. No acute fracture. No aggressive appearing focal osseous lesion or focal pathologic process. Soft tissues and spinal canal: No prevertebral fluid or swelling. No visible canal hematoma. Upper chest: Right apical 9 x 8 mm pulmonary nodule. Biapical centrilobular and paraseptal emphysematous changes. Other: None. IMPRESSION: 1. No acute intracranial abnormality. 2. No acute displaced fracture or traumatic listhesis of the cervical spine. 3. Right apical 9 x 8 mm pulmonary nodule. Recommend outpatient CT chest for further evaluation. 4.  Emphysema (ICD10-J43.9). Electronically Signed   By: Morgane  Naveau M.D.   On: 05/31/2024 19:40   DG Chest Portable 1 View Result Date: 05/31/2024 CLINICAL DATA:  Hypoxia EXAM: PORTABLE CHEST 1 VIEW COMPARISON:  Chest x-ray 03/29/2024 FINDINGS: The heart is enlarged. There is no focal lung infiltrate, pleural effusion or pneumothorax. No acute fractures are seen. IMPRESSION: Cardiomegaly. No acute pulmonary process.  The Electronically Signed   By: Greig Pique M.D.   On: 05/31/2024 18:14   DG Pelvis Portable Result Date: 05/31/2024 CLINICAL DATA:  Seizure EXAM: PORTABLE PELVIS 1-2 VIEWS COMPARISON:  01/10/2024 FINDINGS: Partially visualized left hip replacement with normal alignment. Pubic symphysis and rami appear intact. Mild right hip  degenerative change. No definitive fracture or malalignment. IMPRESSION: No acute osseous abnormality. Electronically Signed   By: Luke Bun M.D.   On: 05/31/2024 18:13    ASSESSMENT & PLAN:  ***  Recommendations:   All questions were answered. The patient knows to call the clinic with any problems, questions or concerns.      Powell FORBES Lessen, NP 06/10/2024 8:32 AM

## 2024-06-12 NOTE — Progress Notes (Signed)
 The proposed treatment discussed in conference is for discussion purpose only and is not a binding recommendation.  The patients have not been physically examined, or presented with their treatment options.  Therefore, final treatment plans cannot be decided.

## 2024-06-15 ENCOUNTER — Telehealth: Payer: Self-pay

## 2024-06-15 DIAGNOSIS — S82831A Other fracture of upper and lower end of right fibula, initial encounter for closed fracture: Secondary | ICD-10-CM

## 2024-06-15 NOTE — Addendum Note (Signed)
 Addended by: VICCI SOBER B on: 06/15/2024 06:28 PM   Modules accepted: Orders

## 2024-06-15 NOTE — Telephone Encounter (Signed)
 Caller would like a follow up call regarding the below. Caller states patient keeps taking the boot off and leg will not heal properly, caller requesting a cast. Caller would like a follow up call within 24 hours.

## 2024-06-15 NOTE — Telephone Encounter (Signed)
 Let patient caregiver know that I do not place cast for fractures.  He will need to see the orthopedic specialist Dr. Vernetta who saw him during his hospitalization.  I have submitted a referral.

## 2024-06-15 NOTE — Telephone Encounter (Signed)
 Copied from CRM #[id number]. Topic: Clinical - Order For Equipment >> Jun 15, 2024 10:18 AM Tobias CROME wrote: Reason for CRM: Patient in rehab, newphew requesting for a cast to be placed on his leg. Patient is on a boot but patient takes it off.   Requesting call back: [phone]

## 2024-06-15 NOTE — Progress Notes (Deleted)
 [facility]   [phone] Fax:([phone]) [phone]   Clinic New Consult Note   Patient Care Team: Vicci Barnie NOVAK, MD as PCP - General (Internal Medicine) Vicci Barnie NOVAK, MD (Internal Medicine) Ardis Evalene CROME, RN as Oncology Nurse Navigator 06/15/2024  CHIEF COMPLAINTS/PURPOSE OF CONSULTATION:  HCC, referred by GI  History of Present Illness   [name] is a 73 year old male with PMH including primary biliary cholangitis, cirrhosis, treated hep C, and seizures is here because of newly diagnosed HCC.  Seen multiple times in the ED this year for recurrent seizure activity.  A CT 12/29/2023 showed segment 8 right liver mass measuring 2.2 x 1.7 cm with apparent enhancement as well as a hypodense 1.4 cm anterior segment 2 left liver lesion which were both new, in addition to multiple other too small to characterize lesions stable since 2018.  This scan was substantially motion-degraded.  Tumor markers 05/14/2024 including CA 19-9, CEA, and AFP were all within normal limits.  A follow-up MRI 04/08/2024 showed a right hepatic lobe segment 4A/8 T2 hyperintense lesion measuring 3.2 cm with arterial postcontrast hyperenhancement and rapid washout on the venous phase with capsular rim enhancement characterized as an LR 5 lesion and had increased since the prior CT; otherwise negative for other lesions or metastatic disease.  A liver biopsy has been ordered but not completed.  Patient is scheduled to see liver transplant at [facility] later today.     MEDICAL HISTORY:  Past Medical History:  Diagnosis Date   Alcoholism (HCC)    History of, not active   Chronic hepatitis C (HCC)    Dementia (HCC)    Diverticulosis    Gait disorder    Gingival hypertrophy    Secondary to Dilantin    Hemiparesis and alteration of sensations as late effects of stroke (HCC) 01/25/2017   Left hemiparesis   Hiatal hernia    History of alcoholism (HCC)    Hx of ischemic vertebrobasilar artery  thalamic stroke    Right   Internal hemorrhoids    Memory disorder 03/04/2013   Primary biliary cirrhosis (HCC)    Seizures (HCC)    Stroke (HCC)    Right frontal, right thalamic   Tubular adenoma of colon    Ulnar neuropathy of left upper extremity     SURGICAL HISTORY: Past Surgical History:  Procedure Laterality Date   COLONOSCOPY WITH PROPOFOL  N/A 07/10/2017   Procedure: COLONOSCOPY WITH PROPOFOL ;  Surgeon: Elicia Claw, MD;  Location: [facility];  Service: Gastroenterology;  Laterality: N/A;   ESOPHAGOGASTRODUODENOSCOPY (EGD) WITH PROPOFOL  N/A 07/10/2017   Procedure: ESOPHAGOGASTRODUODENOSCOPY (EGD) WITH PROPOFOL ;  Surgeon: Elicia Claw, MD;  Location: [facility];  Service: Gastroenterology;  Laterality: N/A;   HIP ARTHROPLASTY Left 11/26/2013   Procedure: LEFT HIP HEMIARTHROPLASTY;  Surgeon: [name], MD;  Location: [facility] OR;  Service: Orthopedics;  Laterality: Left;   WRIST SURGERY Left 95 & 96    SOCIAL HISTORY: Social History   Socioeconomic History   Marital status: Divorced    Spouse name: Not on file   Number of children: 0   Years of education: 10   Highest education level: Not on file  Occupational History   Not on file  Tobacco Use   Smoking status: Former    Current packs/day: 0.00    Types: Cigarettes    Quit date: 01/02/2014    Years since quitting: 10.4   Smokeless tobacco: Former  Building services engineer status: Never Used  Substance  and Sexual Activity   Alcohol use: No    Alcohol/week: 0.0 standard drinks of alcohol    Comment: Former Alcoholic   Drug use: No   Sexual activity: Yes  Other Topics Concern   Not on file  Social History Narrative   ** Merged History Encounter **       Patient is single and lives with a friend and her son. Patient does not have any children. Patient is disabled. Patient has a 10 th grade education. Patient is left-handed. Patient drinks some soda but not everyday.   Social Drivers of  Corporate investment banker Strain: Low Risk  (06/25/2023)   Overall Financial Resource Strain (CARDIA)    Difficulty of Paying Living Expenses: Not hard at all  Food Insecurity: No Food Insecurity (06/01/2024)   Hunger Vital Sign    Worried About Running Out of Food in the Last Year: Never true    Ran Out of Food in the Last Year: Never true  Transportation Needs: No Transportation Needs (06/01/2024)   PRAPARE - Administrator, Civil Service (Medical): No    Lack of Transportation (Non-Medical): No  Physical Activity: Inactive (01/14/2024)   Exercise Vital Sign    Days of Exercise per Week: 0 days    Minutes of Exercise per Session: 0 min  Stress: No Stress Concern Present (06/25/2023)   [employer] of Occupational Health - Occupational Stress Questionnaire    Feeling of Stress : Not at all  Social Connections: Socially Isolated (06/01/2024)   Social Connection and Isolation Panel    Frequency of Communication with Friends and Family: Never    Frequency of Social Gatherings with Friends and Family: More than three times a week    Attends Religious Services: Never    Database administrator or Organizations: No    Attends Banker Meetings: Never    Marital Status: Divorced  Catering manager Violence: Not At Risk (06/01/2024)   Humiliation, Afraid, Rape, and Kick questionnaire    Fear of Current or Ex-Partner: No    Emotionally Abused: No    Physically Abused: No    Sexually Abused: No    FAMILY HISTORY: Family History  Problem Relation Age of Onset   Pulmonary embolism Mother    Liver disease Father    Diabetes Sister    Hypertension Sister    Hypertension Sister    Hypertension Sister    Seizures Neg Hx     ALLERGIES:  has no known allergies.  MEDICATIONS:  Current Outpatient Medications  Medication Sig Dispense Refill   aspirin  EC 81 MG tablet Take 1 tablet (81 mg total) by mouth daily. 100 tablet 0   Lacosamide  100 MG TABS Take 1 tablet  (100 mg total) by mouth in the morning and at bedtime. 180 tablet 3   Lacosamide  150 MG TABS Take 1 tablet (150 mg total) by mouth 2 (two) times daily. 60 tablet 0   levETIRAcetam  (KEPPRA ) 750 MG tablet Take 1,500 mg by mouth 2 (two) times daily.     loratadine  (CLARITIN ) 10 MG tablet Take 10 mg by mouth daily.     memantine  (NAMENDA ) 10 MG tablet Take 1 tablet (10 mg total) by mouth 2 (two) times daily. 180 tablet 0   pantoprazole  (PROTONIX ) 40 MG tablet Take 40 mg by mouth daily.     phenytoin  (DILANTIN ) 100 MG ER capsule Take 2 capsules (200 mg total) by mouth at bedtime. FOR SEIZURES 180 capsule  2   PHENYTOIN  INFATABS 50 MG tablet Chew 0.5 tablets (25 mg total) by mouth at bedtime. 45 tablet 1   rosuvastatin  (CRESTOR ) 10 MG tablet Take 10 mg by mouth daily.     ursodiol  (ACTIGALL ) 500 MG tablet Take 500 mg by mouth 2 (two) times daily.     No current facility-administered medications for this visit.    REVIEW OF SYSTEMS:   Constitutional: Denies fevers, chills or abnormal night sweats Eyes: Denies blurriness of vision, double vision or watery eyes Ears, nose, mouth, throat, and face: Denies mucositis or sore throat Respiratory: Denies cough, dyspnea or wheezes Cardiovascular: Denies palpitation, chest discomfort or lower extremity swelling Gastrointestinal:  Denies nausea, heartburn or change in bowel habits Skin: Denies abnormal skin rashes Lymphatics: Denies new lymphadenopathy or easy bruising Neurological:Denies numbness, tingling or new weaknesses Behavioral/Psych: Mood is stable, no new changes  All other systems were reviewed with the patient and are negative.  PHYSICAL EXAMINATION: ECOG PERFORMANCE STATUS: {CHL ONC ECOG PS:[id number]}  There were no vitals filed for this visit. There were no vitals filed for this visit.  GENERAL:alert, no distress and comfortable SKIN: skin color, texture, turgor are normal, no rashes or significant lesions EYES: normal, conjunctiva are  pink and non-injected, sclera clear OROPHARYNX:no exudate, no erythema and lips, buccal mucosa, and tongue normal  NECK: supple, thyroid  normal size, non-tender, without nodularity LYMPH:  no palpable lymphadenopathy in the cervical, axillary or inguinal LUNGS: clear to auscultation and percussion with normal breathing effort HEART: regular rate & rhythm and no murmurs and no lower extremity edema ABDOMEN:abdomen soft, non-tender and normal bowel sounds Musculoskeletal:no cyanosis of digits and no clubbing  PSYCH: alert & oriented x 3 with fluent speech NEURO: no focal motor/sensory deficits  Physical Exam          LABORATORY DATA:  I have reviewed the data as listed    Latest Ref Rng & Units 06/01/2024    5:35 AM 05/31/2024    9:37 PM 05/31/2024    5:49 AM  CBC  WBC 4.0 - 10.5 K/uL 8.2  10.7  6.2   Hemoglobin 13.0 - 17.0 g/dL 86.6  86.3  86.0   Hematocrit 39.0 - 52.0 % 39.4  39.8  40.8   Platelets 150 - 400 K/uL 166  168  173     @cmpl @  RADIOGRAPHIC STUDIES: I have personally reviewed the radiological images as listed and agreed with the findings in the report. DG Ankle Complete Right Result Date: 06/01/2024 CLINICAL DATA:  Status post fall with right ankle pain. EXAM: RIGHT ANKLE - COMPLETE 3+ VIEW COMPARISON:  None Available. FINDINGS: Mildly displaced distal fibular fracture just proximal to the ankle mortise. No additional fracture. No mortise widening. No ankle joint effusion. Soft tissue edema is most prominent laterally. IMPRESSION: Mildly displaced distal fibular fracture. Electronically Signed   By: Andrea Gasman M.D.   On: 06/01/2024 14:23   CT Head Wo Contrast Result Date: 05/31/2024 CLINICAL DATA:  Head trauma, moderate-severe; Neck trauma (Age >= 65y) EXAM: CT HEAD WITHOUT CONTRAST CT CERVICAL SPINE WITHOUT CONTRAST TECHNIQUE: Multidetector CT imaging of the head and cervical spine was performed following the standard protocol without intravenous contrast.  Multiplanar CT image reconstructions of the cervical spine were also generated. RADIATION DOSE REDUCTION: This exam was performed according to the departmental dose-optimization program which includes automated exposure control, adjustment of the mA and/or kV according to patient size and/or use of iterative reconstruction technique. COMPARISON:  CT head 05/07/2010 FINDINGS:  CT HEAD FINDINGS Brain: Prominence of the lateral ventricles may be related to central predominant atrophy, although a component of normal pressure/communicating hydrocephalus cannot be excluded. Similar-appearing right parietooccipital lobe encephalomalacia. No evidence of large-territorial acute infarction. No parenchymal hemorrhage. No mass lesion. No extra-axial collection. No mass effect or midline shift. No hydrocephalus. Basilar cisterns are patent. Vascular: No hyperdense vessel. Skull: No acute fracture or focal lesion. Sinuses/Orbits: Paranasal sinuses and mastoid air cells are clear. The orbits are unremarkable. Other: None. CT CERVICAL SPINE FINDINGS Alignment: Normal. Skull base and vertebrae: Multilevel moderate degenerative changes of the spine. No associated severe osseous neural foraminal or central canal stenosis. No acute fracture. No aggressive appearing focal osseous lesion or focal pathologic process. Soft tissues and spinal canal: No prevertebral fluid or swelling. No visible canal hematoma. Upper chest: Right apical 9 x 8 mm pulmonary nodule. Biapical centrilobular and paraseptal emphysematous changes. Other: None. IMPRESSION: 1. No acute intracranial abnormality. 2. No acute displaced fracture or traumatic listhesis of the cervical spine. 3. Right apical 9 x 8 mm pulmonary nodule. Recommend outpatient CT chest for further evaluation. 4.  Emphysema (ICD10-J43.9). Electronically Signed   By: Morgane  Naveau M.D.   On: 05/31/2024 19:40   CT Cervical Spine Wo Contrast Result Date: 05/31/2024 CLINICAL DATA:  Head trauma,  moderate-severe; Neck trauma (Age >= 65y) EXAM: CT HEAD WITHOUT CONTRAST CT CERVICAL SPINE WITHOUT CONTRAST TECHNIQUE: Multidetector CT imaging of the head and cervical spine was performed following the standard protocol without intravenous contrast. Multiplanar CT image reconstructions of the cervical spine were also generated. RADIATION DOSE REDUCTION: This exam was performed according to the departmental dose-optimization program which includes automated exposure control, adjustment of the mA and/or kV according to patient size and/or use of iterative reconstruction technique. COMPARISON:  CT head 05/07/2010 FINDINGS: CT HEAD FINDINGS Brain: Prominence of the lateral ventricles may be related to central predominant atrophy, although a component of normal pressure/communicating hydrocephalus cannot be excluded. Similar-appearing right parietooccipital lobe encephalomalacia. No evidence of large-territorial acute infarction. No parenchymal hemorrhage. No mass lesion. No extra-axial collection. No mass effect or midline shift. No hydrocephalus. Basilar cisterns are patent. Vascular: No hyperdense vessel. Skull: No acute fracture or focal lesion. Sinuses/Orbits: Paranasal sinuses and mastoid air cells are clear. The orbits are unremarkable. Other: None. CT CERVICAL SPINE FINDINGS Alignment: Normal. Skull base and vertebrae: Multilevel moderate degenerative changes of the spine. No associated severe osseous neural foraminal or central canal stenosis. No acute fracture. No aggressive appearing focal osseous lesion or focal pathologic process. Soft tissues and spinal canal: No prevertebral fluid or swelling. No visible canal hematoma. Upper chest: Right apical 9 x 8 mm pulmonary nodule. Biapical centrilobular and paraseptal emphysematous changes. Other: None. IMPRESSION: 1. No acute intracranial abnormality. 2. No acute displaced fracture or traumatic listhesis of the cervical spine. 3. Right apical 9 x 8 mm pulmonary  nodule. Recommend outpatient CT chest for further evaluation. 4.  Emphysema (ICD10-J43.9). Electronically Signed   By: Morgane  Naveau M.D.   On: 05/31/2024 19:40   DG Chest Portable 1 View Result Date: 05/31/2024 CLINICAL DATA:  Hypoxia EXAM: PORTABLE CHEST 1 VIEW COMPARISON:  Chest x-ray 03/29/2024 FINDINGS: The heart is enlarged. There is no focal lung infiltrate, pleural effusion or pneumothorax. No acute fractures are seen. IMPRESSION: Cardiomegaly. No acute pulmonary process.  The Electronically Signed   By: Greig Pique M.D.   On: 05/31/2024 18:14   DG Pelvis Portable Result Date: 05/31/2024 CLINICAL DATA:  Seizure EXAM: PORTABLE PELVIS  1-2 VIEWS COMPARISON:  01/10/2024 FINDINGS: Partially visualized left hip replacement with normal alignment. Pubic symphysis and rami appear intact. Mild right hip degenerative change. No definitive fracture or malalignment. IMPRESSION: No acute osseous abnormality. Electronically Signed   By: Luke Bun M.D.   On: 05/31/2024 18:13    ASSESSMENT & PLAN:   Assessment and Plan              No orders of the defined types were placed in this encounter.   All questions were answered. The patient knows to call the clinic with any problems, questions or concerns. I spent {CHL ONC TIME VISIT - DTPQU:[id number]} counseling the patient face to face. The total time spent in the appointment was {CHL ONC TIME VISIT - DTPQU:[id number]} including review of chart and various tests results, discussions about plan of care and coordination of care plan.     Fatih Stalvey K Gevork Ayyad, NP 06/15/2024 12:20 PM

## 2024-06-16 ENCOUNTER — Inpatient Hospital Stay: Payer: Medicare (Managed Care) | Admitting: Nurse Practitioner

## 2024-06-16 ENCOUNTER — Inpatient Hospital Stay: Payer: Medicare (Managed Care)

## 2024-06-16 NOTE — Telephone Encounter (Signed)
 Called & spoke to [facility] (authorized to receive information per DPR on file). Informed that our office does not place casts for fractures. He will need to see the orthopedic specialist Dr.Blackman who saw hi during his hospitalization. Referral has been submitted.  [name] stated that they already have an appointment with him tomorrow 06/17/2024 and will make the request at that time.

## 2024-06-17 ENCOUNTER — Other Ambulatory Visit: Payer: Self-pay

## 2024-06-17 ENCOUNTER — Ambulatory Visit (INDEPENDENT_AMBULATORY_CARE_PROVIDER_SITE_OTHER): Payer: Medicare (Managed Care) | Admitting: Physician Assistant

## 2024-06-17 ENCOUNTER — Encounter: Payer: Self-pay | Admitting: Physician Assistant

## 2024-06-17 DIAGNOSIS — M25571 Pain in right ankle and joints of right foot: Secondary | ICD-10-CM

## 2024-06-17 NOTE — Progress Notes (Signed)
 Office Visit Note   Patient: [name]           Date of Birth: May 04, 1951           MRN: [id number] Visit Date: 06/17/2024              Requested by: Vicci Barnie NOVAK, MD [address] [place],  [place] [address] PCP: Vicci Barnie NOVAK, MD  Chief Complaint  Patient presents with   Right Ankle - Fracture      HPI: The patient is a 73 year old gentleman who was seen while at [facility] for right ankle pain.  He was found to have Weber type B fibular fracture minimally displaced.  He was placed in a cam boot for protection WBAT.  He is here for exam and follow up x ray.    Past medical history includes: Dementia he resides in SNF.  He is not a diabetic.  He is managed for hyperlipidemia and seizures.    Assessment & Plan: Visit Diagnoses:  1. Pain in right ankle and joints of right foot     Plan: Wear Cam boot for activities and protection.  WBAT in cam boot.  Shower as needed and remove for skin checks.    Follow-Up Instructions: Return in about 3 weeks (around 07/08/2024).   Ortho Exam  Patient is alert, oriented, no adenopathy, well-dressed, normal affect, normal respiratory effort. Minimal lateral malleolus edema, no tissue loss, no cellulitis.  NTTP over the lateral malleolus.  Foot warm and well perfused without skin changes or open wounds.      Imaging: Weber B fibular fracture minimally displaced with evidence of new bone healing on x ray.    Labs: Lab Results  Component Value Date   HGBA1C 5.3 05/23/2016   LABURIC 5.0 03/23/2015   REPTSTATUS 01/15/2024 FINAL 01/10/2024   GRAMSTAIN CYTOSPIN NO WBC SEEN NO ORGANISMS SEEN 03/11/2009   CULT  01/10/2024    NO GROWTH 5 DAYS Performed at [facility], [address] [place], [place] [address]      Lab Results  Component Value Date   ALBUMIN 3.7 05/31/2024   ALBUMIN 3.8 05/31/2024   ALBUMIN 4.3 05/14/2024    Lab Results  Component Value Date   MG 1.7 06/01/2024   MG 1.9  01/10/2024   MG 2.1 02/24/2023   Lab Results  Component Value Date   VD25OH 68.0 11/21/2021   VD25OH 71.4 03/13/2017    No results found for: PREALBUMIN    Latest Ref Rng & Units 06/01/2024    5:35 AM 05/31/2024    9:37 PM 05/31/2024    5:49 AM  CBC EXTENDED  WBC 4.0 - 10.5 K/uL 8.2  10.7  6.2   RBC 4.22 - 5.81 MIL/uL 4.22  4.27  4.36   Hemoglobin 13.0 - 17.0 g/dL 86.6  86.3  86.0   HCT 39.0 - 52.0 % 39.4  39.8  40.8   Platelets 150 - 400 K/uL 166  168  173   NEUT# 1.7 - 7.7 K/uL  8.3  4.3   Lymph# 0.7 - 4.0 K/uL  1.4  1.1      There is no height or weight on file to calculate BMI.  Orders:  Orders Placed This Encounter  Procedures   XR Ankle Complete Right   No orders of the defined types were placed in this encounter.    Procedures: No procedures performed  Clinical Data: No additional findings.  ROS:  All other systems negative, except as noted in the HPI. Review of Systems  Objective: Vital Signs: There were no vitals taken for this visit.  Specialty Comments:  No specialty comments available.  PMFS History: Patient Active Problem List   Diagnosis Date Noted   Breakthrough seizure (HCC) 06/01/2024   Closed nondisplaced fracture of lateral malleolus of right fibula 06/01/2024   Community acquired pneumonia 01/10/2024   ARF (acute renal failure) (HCC) 01/10/2024   GERD (gastroesophageal reflux disease) 01/10/2024   Pneumonia 01/10/2024   Thrombocytopenia (HCC) 12/29/2023   Acute metabolic encephalopathy 12/29/2023   Liver mass 12/29/2023   Influenza A 12/29/2023   Seizure disorder (HCC) 12/29/2023   Dementia (HCC) 12/29/2023   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   Vascular dementia with other behavioral disturbance, unspecified dementia severity (HCC) 08/20/2023   Pain due to onychomycosis of toenails of both feet 02/28/2023   Sepsis (HCC) 02/23/2023   Barrett's esophagus 12/25/2021   Bell's palsy 08/23/2020   Elevated alkaline  phosphatase level 11/17/2019   Adenomatous polyp of colon 09/02/2017   Functional urinary incontinence 09/02/2017   Hemiparesis and alteration of sensations as late effects of stroke (HCC) 01/25/2017   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/23/2015   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   Hip fracture (HCC) 11/26/2013   break through seizure 11/26/2013   Memory disorder 03/04/2013   Depressive disorder, not elsewhere classified 07/24/2012   Abnormality of gait 07/24/2012   Focal epilepsy with impairment of consciousness (HCC) 07/24/2012   Cerebral thrombosis with cerebral infarction (HCC) 07/24/2012   Lesion of ulnar nerve 07/24/2012   Past Medical History:  Diagnosis Date   Alcoholism (HCC)    History of, not active   Chronic hepatitis C (HCC)    Dementia (HCC)    Diverticulosis    Gait disorder    Gingival hypertrophy    Secondary to Dilantin    Hemiparesis and alteration of sensations as late effects of stroke (HCC) 01/25/2017   Left hemiparesis   Hiatal hernia    History of alcoholism (HCC)    Hx of ischemic vertebrobasilar artery thalamic stroke    Right   Internal hemorrhoids    Memory disorder 03/04/2013   Primary biliary cirrhosis (HCC)    Seizures (HCC)    Stroke (HCC)    Right frontal, right thalamic   Tubular adenoma of colon    Ulnar neuropathy of left upper extremity     Family History  Problem Relation Age of Onset   Pulmonary embolism Mother    Liver disease Father    Diabetes Sister    Hypertension Sister    Hypertension Sister    Hypertension Sister    Seizures Neg Hx     Past Surgical History:  Procedure Laterality Date   COLONOSCOPY WITH PROPOFOL  N/A 07/10/2017   Procedure: COLONOSCOPY WITH PROPOFOL ;  Surgeon: Elicia Claw, MD;  Location: [facility];  Service: Gastroenterology;  Laterality: N/A;   ESOPHAGOGASTRODUODENOSCOPY (EGD) WITH PROPOFOL  N/A 07/10/2017   Procedure: ESOPHAGOGASTRODUODENOSCOPY (EGD) WITH PROPOFOL ;  Surgeon: Elicia Claw, MD;   Location: [facility];  Service: Gastroenterology;  Laterality: N/A;   HIP ARTHROPLASTY Left 11/26/2013   Procedure: LEFT HIP HEMIARTHROPLASTY;  Surgeon: [name], MD;  Location: [facility];  Service: Orthopedics;  Laterality: Left;   WRIST SURGERY Left 95 & 96   Social History   Occupational History   Not on file  Tobacco Use   Smoking status: Former    Current packs/day: 0.00    Types: Cigarettes  Quit date: 01/02/2014    Years since quitting: 10.4   Smokeless tobacco: Former  Building services engineer status: Never Used  Substance and Sexual Activity   Alcohol use: No    Alcohol/week: 0.0 standard drinks of alcohol    Comment: Former Alcoholic   Drug use: No   Sexual activity: Yes

## 2024-06-23 ENCOUNTER — Telehealth: Payer: Self-pay

## 2024-06-23 NOTE — Telephone Encounter (Signed)
 Received call from [facility], Dr. Devere. He is requesting to speak with Dr. Junella regarding their recommendations. Please call Dr Carolynn at [phone].

## 2024-06-24 ENCOUNTER — Inpatient Hospital Stay: Payer: Medicare (Managed Care)

## 2024-06-24 ENCOUNTER — Encounter: Payer: Self-pay | Admitting: Hematology

## 2024-06-24 ENCOUNTER — Inpatient Hospital Stay: Payer: Medicare (Managed Care) | Attending: Hematology | Admitting: Hematology

## 2024-06-24 VITALS — BP 104/70 | HR 103 | Temp 98.5°F | Resp 18 | Ht 69.0 in | Wt 169.7 lb

## 2024-06-24 DIAGNOSIS — J432 Centrilobular emphysema: Secondary | ICD-10-CM | POA: Insufficient documentation

## 2024-06-24 DIAGNOSIS — K743 Primary biliary cirrhosis: Secondary | ICD-10-CM | POA: Diagnosis not present

## 2024-06-24 DIAGNOSIS — W19XXXA Unspecified fall, initial encounter: Secondary | ICD-10-CM | POA: Insufficient documentation

## 2024-06-24 DIAGNOSIS — S199XXA Unspecified injury of neck, initial encounter: Secondary | ICD-10-CM | POA: Insufficient documentation

## 2024-06-24 DIAGNOSIS — Z7982 Long term (current) use of aspirin: Secondary | ICD-10-CM | POA: Diagnosis not present

## 2024-06-24 DIAGNOSIS — Z8249 Family history of ischemic heart disease and other diseases of the circulatory system: Secondary | ICD-10-CM | POA: Insufficient documentation

## 2024-06-24 DIAGNOSIS — Z87891 Personal history of nicotine dependence: Secondary | ICD-10-CM | POA: Insufficient documentation

## 2024-06-24 DIAGNOSIS — B182 Chronic viral hepatitis C: Secondary | ICD-10-CM | POA: Insufficient documentation

## 2024-06-24 DIAGNOSIS — R160 Hepatomegaly, not elsewhere classified: Secondary | ICD-10-CM | POA: Insufficient documentation

## 2024-06-24 DIAGNOSIS — G9389 Other specified disorders of brain: Secondary | ICD-10-CM | POA: Diagnosis not present

## 2024-06-24 DIAGNOSIS — Z833 Family history of diabetes mellitus: Secondary | ICD-10-CM | POA: Insufficient documentation

## 2024-06-24 DIAGNOSIS — M1611 Unilateral primary osteoarthritis, right hip: Secondary | ICD-10-CM | POA: Insufficient documentation

## 2024-06-24 DIAGNOSIS — C220 Liver cell carcinoma: Secondary | ICD-10-CM

## 2024-06-24 DIAGNOSIS — R569 Unspecified convulsions: Secondary | ICD-10-CM | POA: Diagnosis not present

## 2024-06-24 DIAGNOSIS — I69354 Hemiplegia and hemiparesis following cerebral infarction affecting left non-dominant side: Secondary | ICD-10-CM | POA: Diagnosis not present

## 2024-06-24 DIAGNOSIS — Z79899 Other long term (current) drug therapy: Secondary | ICD-10-CM | POA: Insufficient documentation

## 2024-06-24 DIAGNOSIS — Z604 Social exclusion and rejection: Secondary | ICD-10-CM | POA: Insufficient documentation

## 2024-06-24 DIAGNOSIS — Z825 Family history of asthma and other chronic lower respiratory diseases: Secondary | ICD-10-CM | POA: Insufficient documentation

## 2024-06-24 DIAGNOSIS — S82899A Other fracture of unspecified lower leg, initial encounter for closed fracture: Secondary | ICD-10-CM | POA: Insufficient documentation

## 2024-06-24 DIAGNOSIS — F0390 Unspecified dementia without behavioral disturbance: Secondary | ICD-10-CM | POA: Diagnosis not present

## 2024-06-24 DIAGNOSIS — Z860101 Personal history of adenomatous and serrated colon polyps: Secondary | ICD-10-CM | POA: Diagnosis not present

## 2024-06-24 DIAGNOSIS — Z8379 Family history of other diseases of the digestive system: Secondary | ICD-10-CM | POA: Insufficient documentation

## 2024-06-24 DIAGNOSIS — Z8719 Personal history of other diseases of the digestive system: Secondary | ICD-10-CM | POA: Diagnosis not present

## 2024-06-24 DIAGNOSIS — R0902 Hypoxemia: Secondary | ICD-10-CM | POA: Diagnosis not present

## 2024-06-24 DIAGNOSIS — M479 Spondylosis, unspecified: Secondary | ICD-10-CM | POA: Insufficient documentation

## 2024-06-24 NOTE — Progress Notes (Signed)
 PATIENT NAVIGATOR PROGRESS NOTE  Name: [name] Date: 06/24/2024 MRN: [id number]  DOB: 21-Jul-1951   Reason for visit:  New Patient Appt  Comments:   Patient was seen during his initial Med/Onc appt with Dr. Onita Mattock. Patient was accompanied by his nephew.  SDOH was assessed and no urgent needs were noted.  Patient and his nephew were given my direct contact information and were instructed to contact office if he or the patient had any questions or concerns after today.   Patient currently resides at [facility], but per patient's nephew, should be discharge in the next few weeks. Patient had previously resided with nephew and plan is for patient to move back in with nephew after discharge.  Social work referral was entered.   Time spent counseling/coordinating care: > 60 minutes

## 2024-06-24 NOTE — Progress Notes (Signed)
 [facility]   [phone] Fax:([phone]   Clinic New Consult Note   Patient Care Team: Vicci Barnie NOVAK, MD as PCP - General (Internal Medicine) Vicci Barnie NOVAK, MD (Internal Medicine) Ardis Evalene CROME, RN as Oncology Nurse Navigator 06/24/2024  CHIEF COMPLAINTS/PURPOSE OF CONSULTATION:  Liver mess  REFERRING PHYSICIAN: Dr. Albertus   Discussed the use of AI scribe software for clinical note transcription with the patient, who gave verbal consent to proceed.  History of Present Illness [name] is a 73 year old male with primary biliary cirrhosis, hepatitis C, stroke, dementia, who presents for evaluation of a suspicious liver lesion. He is accompanied by his nephew, who is also his primary caregiver. He was referred for evaluation of a suspicious liver lesion found on imaging.  An MRI in June 2025 shows a liver lesion measuring 3.2 cm, increased from 2.4 cm on a previous CT scan. He has chronic liver disease, including chronic hepatitis and primary biliary cirrhosis. He has a history of alcohol use, which he quit after a hand injury in his sixties.     MEDICAL HISTORY:  Past Medical History:  Diagnosis Date   Alcoholism (HCC)    History of, not active   Chronic hepatitis C (HCC)    Dementia (HCC)    Diverticulosis    Gait disorder    Gingival hypertrophy    Secondary to Dilantin    Hemiparesis and alteration of sensations as late effects of stroke (HCC) 01/25/2017   Left hemiparesis   Hiatal hernia    History of alcoholism (HCC)    Hx of ischemic vertebrobasilar artery thalamic stroke    Right   Internal hemorrhoids    Memory disorder 03/04/2013   Primary biliary cirrhosis (HCC)    Seizures (HCC)    Stroke (HCC)    Right frontal, right thalamic   Tubular adenoma of colon    Ulnar neuropathy of left upper extremity     SURGICAL HISTORY: Past Surgical History:  Procedure Laterality Date   COLONOSCOPY WITH PROPOFOL  N/A  07/10/2017   Procedure: COLONOSCOPY WITH PROPOFOL ;  Surgeon: Elicia Claw, MD;  Location: [facility];  Service: Gastroenterology;  Laterality: N/A;   ESOPHAGOGASTRODUODENOSCOPY (EGD) WITH PROPOFOL  N/A 07/10/2017   Procedure: ESOPHAGOGASTRODUODENOSCOPY (EGD) WITH PROPOFOL ;  Surgeon: Elicia Claw, MD;  Location: [facility];  Service: Gastroenterology;  Laterality: N/A;   HIP ARTHROPLASTY Left 11/26/2013   Procedure: LEFT HIP HEMIARTHROPLASTY;  Surgeon: [name], MD;  Location: [facility] OR;  Service: Orthopedics;  Laterality: Left;   WRIST SURGERY Left 95 & 96    SOCIAL HISTORY: Social History   Socioeconomic History   Marital status: Divorced    Spouse name: Not on file   Number of children: 0   Years of education: 10   Highest education level: Not on file  Occupational History   Not on file  Tobacco Use   Smoking status: Former    Current packs/day: 0.00    Types: Cigarettes    Quit date: 01/02/2014    Years since quitting: 10.4   Smokeless tobacco: Former  Building services engineer status: Never Used  Substance and Sexual Activity   Alcohol use: No    Comment: Former Alcoholic, quit at age of 67   Drug use: No   Sexual activity: Yes  Other Topics Concern   Not on file  Social History Narrative   ** Merged History Encounter **       Patient is single  and lives with a friend and her son. Patient does not have any children. Patient is disabled. Patient has a 10 th grade education. Patient is left-handed. Patient drinks some soda but not everyday.   Social Drivers of Corporate investment banker Strain: Low Risk  (06/25/2023)   Overall Financial Resource Strain (CARDIA)    Difficulty of Paying Living Expenses: Not hard at all  Food Insecurity: No Food Insecurity (06/24/2024)   Hunger Vital Sign    Worried About Running Out of Food in the Last Year: Never true    Ran Out of Food in the Last Year: Never true  Transportation Needs: No Transportation Needs  (06/24/2024)   PRAPARE - Administrator, Civil Service (Medical): No    Lack of Transportation (Non-Medical): No  Physical Activity: Inactive (01/14/2024)   Exercise Vital Sign    Days of Exercise per Week: 0 days    Minutes of Exercise per Session: 0 min  Stress: No Stress Concern Present (06/25/2023)   [employer] of Occupational Health - Occupational Stress Questionnaire    Feeling of Stress : Not at all  Social Connections: Socially Isolated (06/01/2024)   Social Connection and Isolation Panel    Frequency of Communication with Friends and Family: Never    Frequency of Social Gatherings with Friends and Family: More than three times a week    Attends Religious Services: Never    Database administrator or Organizations: No    Attends Banker Meetings: Never    Marital Status: Divorced  Catering manager Violence: Not At Risk (06/24/2024)   Humiliation, Afraid, Rape, and Kick questionnaire    Fear of Current or Ex-Partner: No    Emotionally Abused: No    Physically Abused: No    Sexually Abused: No    FAMILY HISTORY: Family History  Problem Relation Age of Onset   Pulmonary embolism Mother    Liver disease Father    Diabetes Sister    Hypertension Sister    Hypertension Sister    Hypertension Sister    Seizures Neg Hx     ALLERGIES:  has no known allergies.  MEDICATIONS:  Current Outpatient Medications  Medication Sig Dispense Refill   aspirin  EC 81 MG tablet Take 1 tablet (81 mg total) by mouth daily. 100 tablet 0   Lacosamide  150 MG TABS Take 1 tablet (150 mg total) by mouth 2 (two) times daily. 60 tablet 0   levETIRAcetam  (KEPPRA ) 750 MG tablet Take 1,500 mg by mouth 2 (two) times daily.     loratadine  (CLARITIN ) 10 MG tablet Take 10 mg by mouth daily.     memantine  (NAMENDA ) 10 MG tablet Take 1 tablet (10 mg total) by mouth 2 (two) times daily. 180 tablet 0   pantoprazole  (PROTONIX ) 40 MG tablet Take 40 mg by mouth daily.     phenytoin   (DILANTIN ) 100 MG ER capsule Take 2 capsules (200 mg total) by mouth at bedtime. FOR SEIZURES 180 capsule 2   PHENYTOIN  INFATABS 50 MG tablet Chew 0.5 tablets (25 mg total) by mouth at bedtime. 45 tablet 1   rosuvastatin  (CRESTOR ) 10 MG tablet Take 10 mg by mouth daily.     ursodiol  (ACTIGALL ) 500 MG tablet Take 500 mg by mouth 2 (two) times daily.     Lacosamide  100 MG TABS Take 1 tablet (100 mg total) by mouth in the morning and at bedtime. (Patient not taking: Reported on 06/24/2024) 180 tablet 3  No current facility-administered medications for this visit.    REVIEW OF SYSTEMS:   Constitutional: Denies fevers, chills or abnormal night sweats Eyes: Denies blurriness of vision, double vision or watery eyes Ears, nose, mouth, throat, and face: Denies mucositis or sore throat Respiratory: Denies cough, dyspnea or wheezes Cardiovascular: Denies palpitation, chest discomfort or lower extremity swelling Gastrointestinal:  Denies nausea, heartburn or change in bowel habits Skin: Denies abnormal skin rashes Lymphatics: Denies new lymphadenopathy or easy bruising Neurological:Denies numbness, tingling or new weaknesses Behavioral/Psych: Mood is stable, no new changes  All other systems were reviewed with the patient and are negative.  PHYSICAL EXAMINATION: ECOG PERFORMANCE STATUS: 2 - Symptomatic, <50% confined to bed  Vitals:   06/24/24 1512  BP: 104/70  Pulse: (!) 103  Resp: 18  Temp: 98.5 F (36.9 C)  SpO2: 94%   Filed Weights   06/24/24 1512  Weight: 169 lb 11.2 oz (77 kg)    GENERAL:alert, no distress and comfortable SKIN: skin color, texture, turgor are normal, no rashes or significant lesions EYES: normal, conjunctiva are pink and non-injected, sclera clear OROPHARYNX:no exudate, no erythema and lips, buccal mucosa, and tongue normal  NECK: supple, thyroid  normal size, non-tender, without nodularity LYMPH:  no palpable lymphadenopathy in the cervical, axillary or  inguinal LUNGS: clear to auscultation and percussion with normal breathing effort HEART: regular rate & rhythm and no murmurs and no lower extremity edema ABDOMEN:abdomen soft, non-tender and normal bowel sounds Musculoskeletal:no cyanosis of digits and no clubbing  PSYCH: alert & oriented x 3 with fluent speech NEURO: no focal motor/sensory deficits  Physical Exam ABDOMEN: Abdomen non-tender, not enlarged.  LABORATORY DATA:  I have reviewed the data as listed    Latest Ref Rng & Units 06/01/2024    5:35 AM 05/31/2024    9:37 PM 05/31/2024    5:49 AM  CBC  WBC 4.0 - 10.5 K/uL 8.2  10.7  6.2   Hemoglobin 13.0 - 17.0 g/dL 86.6  86.3  86.0   Hematocrit 39.0 - 52.0 % 39.4  39.8  40.8   Platelets 150 - 400 K/uL 166  168  173     @cmpl @  RADIOGRAPHIC STUDIES: I have personally reviewed the radiological images as listed and agreed with the findings in the report. DG Ankle Complete Right Result Date: 06/01/2024 CLINICAL DATA:  Status post fall with right ankle pain. EXAM: RIGHT ANKLE - COMPLETE 3+ VIEW COMPARISON:  None Available. FINDINGS: Mildly displaced distal fibular fracture just proximal to the ankle mortise. No additional fracture. No mortise widening. No ankle joint effusion. Soft tissue edema is most prominent laterally. IMPRESSION: Mildly displaced distal fibular fracture. Electronically Signed   By: Andrea Gasman M.D.   On: 06/01/2024 14:23   CT Head Wo Contrast Result Date: 05/31/2024 CLINICAL DATA:  Head trauma, moderate-severe; Neck trauma (Age >= 65y) EXAM: CT HEAD WITHOUT CONTRAST CT CERVICAL SPINE WITHOUT CONTRAST TECHNIQUE: Multidetector CT imaging of the head and cervical spine was performed following the standard protocol without intravenous contrast. Multiplanar CT image reconstructions of the cervical spine were also generated. RADIATION DOSE REDUCTION: This exam was performed according to the departmental dose-optimization program which includes automated exposure  control, adjustment of the mA and/or kV according to patient size and/or use of iterative reconstruction technique. COMPARISON:  CT head 05/07/2010 FINDINGS: CT HEAD FINDINGS Brain: Prominence of the lateral ventricles may be related to central predominant atrophy, although a component of normal pressure/communicating hydrocephalus cannot be excluded. Similar-appearing right parietooccipital lobe  encephalomalacia. No evidence of large-territorial acute infarction. No parenchymal hemorrhage. No mass lesion. No extra-axial collection. No mass effect or midline shift. No hydrocephalus. Basilar cisterns are patent. Vascular: No hyperdense vessel. Skull: No acute fracture or focal lesion. Sinuses/Orbits: Paranasal sinuses and mastoid air cells are clear. The orbits are unremarkable. Other: None. CT CERVICAL SPINE FINDINGS Alignment: Normal. Skull base and vertebrae: Multilevel moderate degenerative changes of the spine. No associated severe osseous neural foraminal or central canal stenosis. No acute fracture. No aggressive appearing focal osseous lesion or focal pathologic process. Soft tissues and spinal canal: No prevertebral fluid or swelling. No visible canal hematoma. Upper chest: Right apical 9 x 8 mm pulmonary nodule. Biapical centrilobular and paraseptal emphysematous changes. Other: None. IMPRESSION: 1. No acute intracranial abnormality. 2. No acute displaced fracture or traumatic listhesis of the cervical spine. 3. Right apical 9 x 8 mm pulmonary nodule. Recommend outpatient CT chest for further evaluation. 4.  Emphysema (ICD10-J43.9). Electronically Signed   By: Morgane  Naveau M.D.   On: 05/31/2024 19:40   CT Cervical Spine Wo Contrast Result Date: 05/31/2024 CLINICAL DATA:  Head trauma, moderate-severe; Neck trauma (Age >= 65y) EXAM: CT HEAD WITHOUT CONTRAST CT CERVICAL SPINE WITHOUT CONTRAST TECHNIQUE: Multidetector CT imaging of the head and cervical spine was performed following the standard protocol  without intravenous contrast. Multiplanar CT image reconstructions of the cervical spine were also generated. RADIATION DOSE REDUCTION: This exam was performed according to the departmental dose-optimization program which includes automated exposure control, adjustment of the mA and/or kV according to patient size and/or use of iterative reconstruction technique. COMPARISON:  CT head 05/07/2010 FINDINGS: CT HEAD FINDINGS Brain: Prominence of the lateral ventricles may be related to central predominant atrophy, although a component of normal pressure/communicating hydrocephalus cannot be excluded. Similar-appearing right parietooccipital lobe encephalomalacia. No evidence of large-territorial acute infarction. No parenchymal hemorrhage. No mass lesion. No extra-axial collection. No mass effect or midline shift. No hydrocephalus. Basilar cisterns are patent. Vascular: No hyperdense vessel. Skull: No acute fracture or focal lesion. Sinuses/Orbits: Paranasal sinuses and mastoid air cells are clear. The orbits are unremarkable. Other: None. CT CERVICAL SPINE FINDINGS Alignment: Normal. Skull base and vertebrae: Multilevel moderate degenerative changes of the spine. No associated severe osseous neural foraminal or central canal stenosis. No acute fracture. No aggressive appearing focal osseous lesion or focal pathologic process. Soft tissues and spinal canal: No prevertebral fluid or swelling. No visible canal hematoma. Upper chest: Right apical 9 x 8 mm pulmonary nodule. Biapical centrilobular and paraseptal emphysematous changes. Other: None. IMPRESSION: 1. No acute intracranial abnormality. 2. No acute displaced fracture or traumatic listhesis of the cervical spine. 3. Right apical 9 x 8 mm pulmonary nodule. Recommend outpatient CT chest for further evaluation. 4.  Emphysema (ICD10-J43.9). Electronically Signed   By: Morgane  Naveau M.D.   On: 05/31/2024 19:40   DG Chest Portable 1 View Result Date:  05/31/2024 CLINICAL DATA:  Hypoxia EXAM: PORTABLE CHEST 1 VIEW COMPARISON:  Chest x-ray 03/29/2024 FINDINGS: The heart is enlarged. There is no focal lung infiltrate, pleural effusion or pneumothorax. No acute fractures are seen. IMPRESSION: Cardiomegaly. No acute pulmonary process.  The Electronically Signed   By: Greig Pique M.D.   On: 05/31/2024 18:14   DG Pelvis Portable Result Date: 05/31/2024 CLINICAL DATA:  Seizure EXAM: PORTABLE PELVIS 1-2 VIEWS COMPARISON:  01/10/2024 FINDINGS: Partially visualized left hip replacement with normal alignment. Pubic symphysis and rami appear intact. Mild right hip degenerative change. No definitive fracture or malalignment.  IMPRESSION: No acute osseous abnormality. Electronically Signed   By: Luke Bun M.D.   On: 05/31/2024 18:13   Assessment & Plan Suspicious hepatic mass (possible hepatocellular carcinoma or cholangiocarcinoma) in the setting of liver cirrhosis and primary biliary cirrhosis. A hepatic mass identified on MRI in June 2025, measuring 3.2 cm, has increased from 2.4 cm on CT. It is highly suspicious for hepatocellular carcinoma, with cholangiocarcinoma as a differential. The mass is in the liver, with underlying cirrhosis and primary biliary cirrhosis, elevating cancer risk. He is ineligible for liver transplant due to age and comorbidities. - Discussed treatment options: radiation therapy and an invasive procedure involving transarterial treatment delivery. Radiation is preferred for its non-invasive nature, but his dementia may affect his ability to remain still. Sedation or drowsiness-inducing medication during radiation was considered. The invasive procedure poses challenges due to age and medical conditions. - Order CT chest to assess for metastasis. - Refer to Dr. Dewey for radiation therapy consultation. - Refer to interventional radiology for consultation on the invasive procedure. - Review case in tumor conference to determine the  optimal treatment approach.  Plan - CT and abdominal MRI images reviewed - Will obtain CT chest without contrast - Patient has been evaluated by liver transplant team at [facility] - B, he is not a candidate for liver transplant due to his advanced age and medical comorbidities. -discussed option of SBRT radiation and embolization.  Patient's nephew prefers SBRT.  Will refer patient to Dr. Dewey. - Lab and follow-up in 2 months   No orders of the defined types were placed in this encounter.   All questions were answered. The patient knows to call the clinic with any problems, questions or concerns. I spent 40 minutes counseling the patient face to face. The total time spent in the appointment was 50 minutes including review of chart and various tests results, discussions about plan of care and coordination of care plan.     Onita Mattock, MD 06/24/2024

## 2024-06-25 ENCOUNTER — Ambulatory Visit: Admitting: Internal Medicine

## 2024-06-25 DIAGNOSIS — C220 Liver cell carcinoma: Secondary | ICD-10-CM | POA: Insufficient documentation

## 2024-06-25 NOTE — Telephone Encounter (Signed)
 Called the number back and it is the correct number, I misunderstood the MD name. I spoke with hin and his last name is Dr. Nadara. Please call him.

## 2024-06-26 ENCOUNTER — Other Ambulatory Visit: Payer: Self-pay

## 2024-06-26 DIAGNOSIS — R160 Hepatomegaly, not elsewhere classified: Secondary | ICD-10-CM

## 2024-06-26 NOTE — Telephone Encounter (Signed)
 Order entered for IR eval for possible embolization. Will check to see when scheduled with IR to schedule follow-up OV with GI.

## 2024-06-26 NOTE — Telephone Encounter (Signed)
 I spoke with Dr. Nadara at [facility] He saw [name] for his probable HCC in the setting of his PBC cirrhosis He is not a candidate per Dr. Nadara for liver transplant or hepatic resection Dr. Nadara recommends IR consultation for embolization  Please refer patient to interventional radiology in [facility] for [facility] and consideration of embolization He should also have follow-up with me or POD A APP after seeing IR

## 2024-06-29 ENCOUNTER — Encounter (HOSPITAL_COMMUNITY): Payer: Self-pay | Admitting: Emergency Medicine

## 2024-06-29 ENCOUNTER — Emergency Department (HOSPITAL_COMMUNITY)
Admission: EM | Admit: 2024-06-29 | Discharge: 2024-06-29 | Disposition: A | Discharge status: Home / Self Care | Payer: Medicare (Managed Care) | Attending: Emergency Medicine | Admitting: Emergency Medicine

## 2024-06-29 ENCOUNTER — Other Ambulatory Visit: Payer: Self-pay

## 2024-06-29 DIAGNOSIS — Z7982 Long term (current) use of aspirin: Secondary | ICD-10-CM | POA: Insufficient documentation

## 2024-06-29 DIAGNOSIS — F0390 Unspecified dementia without behavioral disturbance: Secondary | ICD-10-CM | POA: Diagnosis not present

## 2024-06-29 DIAGNOSIS — R569 Unspecified convulsions: Secondary | ICD-10-CM | POA: Diagnosis present

## 2024-06-29 LAB — COMPREHENSIVE METABOLIC PANEL WITH GFR
ALT: 10 U/L (ref 0–44)
AST: 18 U/L (ref 15–41)
Albumin: 4.2 g/dL (ref 3.5–5.0)
Alkaline Phosphatase: 194 U/L — ABNORMAL HIGH (ref 38–126)
Anion gap: 13 (ref 5–15)
BUN: 15 mg/dL (ref 8–23)
CO2: 24 mmol/L (ref 22–32)
Calcium: 9.6 mg/dL (ref 8.9–10.3)
Chloride: 105 mmol/L (ref 98–111)
Creatinine, Ser: 0.95 mg/dL (ref 0.61–1.24)
GFR, Estimated: >60 mL/min (ref >60)
Glucose, Bld: 113 mg/dL — ABNORMAL HIGH (ref 70–99)
Potassium: 4.7 mmol/L (ref 3.5–5.1)
Sodium: 142 mmol/L (ref 135–145)
Total Bilirubin: 0.3 mg/dL (ref 0.0–1.2)
Total Protein: 7.2 g/dL (ref 6.5–8.1)

## 2024-06-29 LAB — CBC WITH DIFFERENTIAL/PLATELET
Abs Immature Granulocytes: 0.02 K/uL (ref 0.00–0.07)
Basophils Absolute: 0 K/uL (ref 0.0–0.1)
Basophils Relative: 0 %
Eosinophils Absolute: 0.3 K/uL (ref 0.0–0.5)
Eosinophils Relative: 4 %
HCT: 40.2 % (ref 39.0–52.0)
Hemoglobin: 13.8 g/dL (ref 13.0–17.0)
Immature Granulocytes: 0 %
Lymphocytes Relative: 20 %
Lymphs Abs: 1.4 K/uL (ref 0.7–4.0)
MCH: 33.1 pg (ref 26.0–34.0)
MCHC: 34.3 g/dL (ref 30.0–36.0)
MCV: 96.4 fL (ref 80.0–100.0)
Monocytes Absolute: 0.8 K/uL (ref 0.1–1.0)
Monocytes Relative: 11 %
Neutro Abs: 4.3 K/uL (ref 1.7–7.7)
Neutrophils Relative %: 65 %
Platelets: 183 K/uL (ref 150–400)
RBC: 4.17 MIL/uL — ABNORMAL LOW (ref 4.22–5.81)
RDW: 12.7 % (ref 11.5–15.5)
WBC: 6.7 K/uL (ref 4.0–10.5)
nRBC: 0 % (ref 0.0–0.2)

## 2024-06-29 LAB — PHENYTOIN LEVEL, TOTAL: Phenytoin Lvl: 10.4 ug/mL (ref 10.0–20.0)

## 2024-06-29 LAB — MAGNESIUM: Magnesium: 2.4 mg/dL (ref 1.7–2.4)

## 2024-06-29 MED ORDER — LEVETIRACETAM (KEPPRA) 500 MG/5 ML ADULT IV PUSH
1500.0000 mg | Freq: Once | INTRAVENOUS | Status: AC
  Administered 2024-06-29: 1500 mg via INTRAVENOUS
  Filled 2024-06-29: qty 15

## 2024-06-29 NOTE — Progress Notes (Incomplete)
 Radiation Oncology         [phone]) [phone] ________________________________  Name: [name]        MRN: [id number]  Date of Service: 06/30/2024 DOB: November 01, 1950  RR:Gnywdnw, Barnie NOVAK, MD  Lanny Callander, MD     REFERRING PHYSICIAN: Lanny Callander, MD   DIAGNOSIS: The encounter diagnosis was Hepatocellular carcinoma [facility]).   HISTORY OF PRESENT ILLNESS: [name] is a 73 y.o. male seen at the request of Dr. Lanny for a diagnosis of hepatocellular carcinoma. The patient has a history of primary biliary cirrhosis, seizure disorder, dementia and prior CVA. He had a CT abdomen pelvis in March due to abdominal pain and this showed new segment 8 mass in the right liver measuring 2.2 cm, with enhancement, and hypodense segment 2 lesion measuring 1.4 cm. There was another subcentimeter lesion in the right liver too small to characterize as well but unchanged from 2018.  An MRI on 04/08/24 showed persistent segment 4A/8 lesion measuring 3.2 cm described as LI-RADS 5. An AFP was drawn on 05/14/24 and was 1.5. He was referred to oncology and Dr. Lanny did not think he was a good candidate for liver transplant. He was referred to IR to discuss ablative options of treatment, and seen today to consider stereotactic body radiotherapy (SBRT) if IR procedures are not offered.     PREVIOUS RADIATION THERAPY: No   PAST MEDICAL HISTORY:  Past Medical History:  Diagnosis Date   Alcoholism (HCC)    History of, not active   Chronic hepatitis C (HCC)    Dementia (HCC)    Diverticulosis    Gait disorder    Gingival hypertrophy    Secondary to Dilantin    Hemiparesis and alteration of sensations as late effects of stroke (HCC) 01/25/2017   Left hemiparesis   Hiatal hernia    History of alcoholism (HCC)    Hx of ischemic vertebrobasilar artery thalamic stroke    Right   Internal hemorrhoids    Memory disorder 03/04/2013   Primary biliary cirrhosis (HCC)    Seizures (HCC)    Stroke (HCC)    Right frontal, right  thalamic   Tubular adenoma of colon    Ulnar neuropathy of left upper extremity        PAST SURGICAL HISTORY: Past Surgical History:  Procedure Laterality Date   COLONOSCOPY WITH PROPOFOL  N/A 07/10/2017   Procedure: COLONOSCOPY WITH PROPOFOL ;  Surgeon: Elicia Claw, MD;  Location: [facility];  Service: Gastroenterology;  Laterality: N/A;   ESOPHAGOGASTRODUODENOSCOPY (EGD) WITH PROPOFOL  N/A 07/10/2017   Procedure: ESOPHAGOGASTRODUODENOSCOPY (EGD) WITH PROPOFOL ;  Surgeon: Elicia Claw, MD;  Location: [facility];  Service: Gastroenterology;  Laterality: N/A;   HIP ARTHROPLASTY Left 11/26/2013   Procedure: LEFT HIP HEMIARTHROPLASTY;  Surgeon: [name], MD;  Location: [facility] OR;  Service: Orthopedics;  Laterality: Left;   WRIST SURGERY Left 95 & 96     FAMILY HISTORY:  Family History  Problem Relation Age of Onset   Pulmonary embolism Mother    Liver disease Father    Diabetes Sister    Hypertension Sister    Hypertension Sister    Hypertension Sister    Seizures Neg Hx      SOCIAL HISTORY:  reports that he quit smoking about 10 years ago. His smoking use included cigarettes. He has quit using smokeless tobacco. He reports that he does not drink alcohol and does not use drugs. The patient is divorced and lives in [place]. His next of kin  is Elye Harmsen, his nephew who helps with his medical decision making.    ALLERGIES: Patient has no known allergies.   MEDICATIONS:  Current Outpatient Medications  Medication Sig Dispense Refill   aspirin  EC 81 MG tablet Take 1 tablet (81 mg total) by mouth daily. 100 tablet 0   Lacosamide  100 MG TABS Take 1 tablet (100 mg total) by mouth in the morning and at bedtime. (Patient not taking: Reported on 06/24/2024) 180 tablet 3   Lacosamide  150 MG TABS Take 1 tablet (150 mg total) by mouth 2 (two) times daily. 60 tablet 0   levETIRAcetam  (KEPPRA ) 750 MG tablet Take 1,500 mg by mouth 2 (two) times daily.     loratadine   (CLARITIN ) 10 MG tablet Take 10 mg by mouth daily.     memantine  (NAMENDA ) 10 MG tablet Take 1 tablet (10 mg total) by mouth 2 (two) times daily. 180 tablet 0   pantoprazole  (PROTONIX ) 40 MG tablet Take 40 mg by mouth daily.     phenytoin  (DILANTIN ) 100 MG ER capsule Take 2 capsules (200 mg total) by mouth at bedtime. FOR SEIZURES 180 capsule 2   PHENYTOIN  INFATABS 50 MG tablet Chew 0.5 tablets (25 mg total) by mouth at bedtime. 45 tablet 1   rosuvastatin  (CRESTOR ) 10 MG tablet Take 10 mg by mouth daily.     ursodiol  (ACTIGALL ) 500 MG tablet Take 500 mg by mouth 2 (two) times daily.     No current facility-administered medications for this visit.     REVIEW OF SYSTEMS: On review of systems, the patient reports that ***      PHYSICAL EXAM:  Wt Readings from Last 3 Encounters:  06/24/24 169 lb 11.2 oz (77 kg)  06/01/24 171 lb 1.2 oz (77.6 kg)  05/14/24 173 lb (78.5 kg)   Temp Readings from Last 3 Encounters:  06/29/24 97.8 F (36.6 C) (Oral)  06/24/24 98.5 F (36.9 C) (Temporal)  06/02/24 98.4 F (36.9 C)   BP Readings from Last 3 Encounters:  06/29/24 109/70  06/24/24 104/70  06/02/24 112/75   Pulse Readings from Last 3 Encounters:  06/29/24 71  06/24/24 (!) 103  06/02/24 87    /10  In general this is a well appearing *** in no acute distress. ***'s alert and oriented x4 and appropriate throughout the examination. Cardiopulmonary assessment is negative for acute distress and *** exhibits normal effort.     ECOG = ***  0 - Asymptomatic (Fully active, able to carry on all predisease activities without restriction)  1 - Symptomatic but completely ambulatory (Restricted in physically strenuous activity but ambulatory and able to carry out work of a light or sedentary nature. For example, light housework, office work)  2 - Symptomatic, <50% in bed during the day (Ambulatory and capable of all self care but unable to carry out any work activities. Up and about more than  50% of waking hours)  3 - Symptomatic, >50% in bed, but not bedbound (Capable of only limited self-care, confined to bed or chair 50% or more of waking hours)  4 - Bedbound (Completely disabled. Cannot carry on any self-care. Totally confined to bed or chair)  5 - Death   Raylene MM, Creech RH, Tormey DC, et al. [phone]). Toxicity and response criteria of the [facility]. Am. DOROTHA Bridges. Oncol. 5 (6): 649-55    LABORATORY DATA:  Lab Results  Component Value Date   WBC 8.2 06/01/2024   HGB 13.3 06/01/2024  HCT 39.4 06/01/2024   MCV 93.4 06/01/2024   PLT 166 06/01/2024   Lab Results  Component Value Date   NA 135 06/01/2024   K 4.4 06/01/2024   CL 102 06/01/2024   CO2 24 06/01/2024   Lab Results  Component Value Date   ALT 14 05/31/2024   AST 18 05/31/2024   GGT 112 (H) 09/11/2019   ALKPHOS 146 (H) 05/31/2024   BILITOT 0.7 05/31/2024      RADIOGRAPHY: DG Ankle Complete Right Result Date: 06/01/2024 CLINICAL DATA:  Status post fall with right ankle pain. EXAM: RIGHT ANKLE - COMPLETE 3+ VIEW COMPARISON:  None Available. FINDINGS: Mildly displaced distal fibular fracture just proximal to the ankle mortise. No additional fracture. No mortise widening. No ankle joint effusion. Soft tissue edema is most prominent laterally. IMPRESSION: Mildly displaced distal fibular fracture. Electronically Signed   By: Andrea Gasman M.D.   On: 06/01/2024 14:23   CT Head Wo Contrast Result Date: 05/31/2024 CLINICAL DATA:  Head trauma, moderate-severe; Neck trauma (Age >= 65y) EXAM: CT HEAD WITHOUT CONTRAST CT CERVICAL SPINE WITHOUT CONTRAST TECHNIQUE: Multidetector CT imaging of the head and cervical spine was performed following the standard protocol without intravenous contrast. Multiplanar CT image reconstructions of the cervical spine were also generated. RADIATION DOSE REDUCTION: This exam was performed according to the departmental dose-optimization program which includes  automated exposure control, adjustment of the mA and/or kV according to patient size and/or use of iterative reconstruction technique. COMPARISON:  CT head 05/07/2010 FINDINGS: CT HEAD FINDINGS Brain: Prominence of the lateral ventricles may be related to central predominant atrophy, although a component of normal pressure/communicating hydrocephalus cannot be excluded. Similar-appearing right parietooccipital lobe encephalomalacia. No evidence of large-territorial acute infarction. No parenchymal hemorrhage. No mass lesion. No extra-axial collection. No mass effect or midline shift. No hydrocephalus. Basilar cisterns are patent. Vascular: No hyperdense vessel. Skull: No acute fracture or focal lesion. Sinuses/Orbits: Paranasal sinuses and mastoid air cells are clear. The orbits are unremarkable. Other: None. CT CERVICAL SPINE FINDINGS Alignment: Normal. Skull base and vertebrae: Multilevel moderate degenerative changes of the spine. No associated severe osseous neural foraminal or central canal stenosis. No acute fracture. No aggressive appearing focal osseous lesion or focal pathologic process. Soft tissues and spinal canal: No prevertebral fluid or swelling. No visible canal hematoma. Upper chest: Right apical 9 x 8 mm pulmonary nodule. Biapical centrilobular and paraseptal emphysematous changes. Other: None. IMPRESSION: 1. No acute intracranial abnormality. 2. No acute displaced fracture or traumatic listhesis of the cervical spine. 3. Right apical 9 x 8 mm pulmonary nodule. Recommend outpatient CT chest for further evaluation. 4.  Emphysema (ICD10-J43.9). Electronically Signed   By: Morgane  Naveau M.D.   On: 05/31/2024 19:40   CT Cervical Spine Wo Contrast Result Date: 05/31/2024 CLINICAL DATA:  Head trauma, moderate-severe; Neck trauma (Age >= 65y) EXAM: CT HEAD WITHOUT CONTRAST CT CERVICAL SPINE WITHOUT CONTRAST TECHNIQUE: Multidetector CT imaging of the head and cervical spine was performed following the  standard protocol without intravenous contrast. Multiplanar CT image reconstructions of the cervical spine were also generated. RADIATION DOSE REDUCTION: This exam was performed according to the departmental dose-optimization program which includes automated exposure control, adjustment of the mA and/or kV according to patient size and/or use of iterative reconstruction technique. COMPARISON:  CT head 05/07/2010 FINDINGS: CT HEAD FINDINGS Brain: Prominence of the lateral ventricles may be related to central predominant atrophy, although a component of normal pressure/communicating hydrocephalus cannot be excluded. Similar-appearing right parietooccipital lobe  encephalomalacia. No evidence of large-territorial acute infarction. No parenchymal hemorrhage. No mass lesion. No extra-axial collection. No mass effect or midline shift. No hydrocephalus. Basilar cisterns are patent. Vascular: No hyperdense vessel. Skull: No acute fracture or focal lesion. Sinuses/Orbits: Paranasal sinuses and mastoid air cells are clear. The orbits are unremarkable. Other: None. CT CERVICAL SPINE FINDINGS Alignment: Normal. Skull base and vertebrae: Multilevel moderate degenerative changes of the spine. No associated severe osseous neural foraminal or central canal stenosis. No acute fracture. No aggressive appearing focal osseous lesion or focal pathologic process. Soft tissues and spinal canal: No prevertebral fluid or swelling. No visible canal hematoma. Upper chest: Right apical 9 x 8 mm pulmonary nodule. Biapical centrilobular and paraseptal emphysematous changes. Other: None. IMPRESSION: 1. No acute intracranial abnormality. 2. No acute displaced fracture or traumatic listhesis of the cervical spine. 3. Right apical 9 x 8 mm pulmonary nodule. Recommend outpatient CT chest for further evaluation. 4.  Emphysema (ICD10-J43.9). Electronically Signed   By: Morgane  Naveau M.D.   On: 05/31/2024 19:40   DG Chest Portable 1 View Result  Date: 05/31/2024 CLINICAL DATA:  Hypoxia EXAM: PORTABLE CHEST 1 VIEW COMPARISON:  Chest x-ray 03/29/2024 FINDINGS: The heart is enlarged. There is no focal lung infiltrate, pleural effusion or pneumothorax. No acute fractures are seen. IMPRESSION: Cardiomegaly. No acute pulmonary process.  The Electronically Signed   By: Greig Pique M.D.   On: 05/31/2024 18:14   DG Pelvis Portable Result Date: 05/31/2024 CLINICAL DATA:  Seizure EXAM: PORTABLE PELVIS 1-2 VIEWS COMPARISON:  01/10/2024 FINDINGS: Partially visualized left hip replacement with normal alignment. Pubic symphysis and rami appear intact. Mild right hip degenerative change. No definitive fracture or malalignment. IMPRESSION: No acute osseous abnormality. Electronically Signed   By: Luke Bun M.D.   On: 05/31/2024 18:13       IMPRESSION/PLAN: 1. Heptatic lesion consistent with hepatocellular carcinoma. Dr. Dewey discusses the patient's course to date and imaging findings. He agrees with evaluation with interventional radiotherapy (IR). If he is not a candidate for ablative intervention, he would be a candidate for stereotactic body radiotherapy (SBRT). We also discussed the need for fiducial marker placement and a biopsy could be considered at that time to confirm his diagnosis. We discussed the risks, benefits, short, and long term effects of radiotherapy, as well as the curative intent, and the patient is interested in proceeding. Dr. Dewey discusses the delivery and logistics of radiotherapy and anticipates a course of 5 fractions of radiotherapy to the liver lesion. Written consent is obtained and placed in the chart, a copy was provided to the patient. ***   In a visit lasting *** minutes, greater than 50% of the time was spent face to face discussing the patient's condition, in preparation for the discussion, and coordinating the patient's care.   The above documentation reflects my direct findings during this shared patient visit.  Please see the separate note by Dr. Dewey on this date for the remainder of the patient's plan of care.    [name], [facility]   **Disclaimer: This note was dictated with voice recognition software. Similar sounding words can inadvertently be transcribed and this note may contain transcription errors which may not have been corrected upon publication of note.**

## 2024-06-29 NOTE — ED Triage Notes (Signed)
 Pt arriving via [facility] from home for witnessed seizure. Pts wife states seizure lasted approx 3 minutes. Pt was in the bed. No oral trauma, no urinary incontinence. Pt has dementia, able to respond appropriately at this time.

## 2024-06-29 NOTE — Discharge Instructions (Signed)
 Call the neurology office tomorrow morning to help set up close outpatient follow-up and discuss potential medication changes.  Take your seizure medicines as prescribed.  However, if he develops recurrent seizures, change in mental status, fever, or any other new/concerning symptoms then return to the ER.

## 2024-06-29 NOTE — ED Provider Notes (Signed)
 [name]   CSN: [id number] Arrival date & time: 06/29/24  2024     Patient presents with: Seizures   [name] is a 73 y.o. male.   HPI 73 year old male presents with a seizure. History is limited as no family is present and he has dementia. I did talk to his niece, [name], who was able to provide some information. He has a history of breakthrough seizures and has reportedly been compliant with his seizure meds. Had one seizure tonight. Otherwise no acute complaints.  Prior to Admission medications   Medication Sig Start Date End Date Taking? Authorizing Provider  aspirin  EC 81 MG tablet Take 1 tablet (81 mg total) by mouth daily. 03/04/20   Vicci Barnie NOVAK, MD  Lacosamide  100 MG TABS Take 1 tablet (100 mg total) by mouth in the morning and at bedtime. Patient not taking: Reported on 06/24/2024 06/08/24 12/05/24  Onita Duos, MD  Lacosamide  150 MG TABS Take 1 tablet (150 mg total) by mouth 2 (two) times daily. 06/02/24   Cindy Garnette POUR, MD  levETIRAcetam  (KEPPRA ) 750 MG tablet Take 1,500 mg by mouth 2 (two) times daily.    [provider]  loratadine  (CLARITIN ) 10 MG tablet Take 10 mg by mouth daily.    [provider]  memantine  (NAMENDA ) 10 MG tablet Take 1 tablet (10 mg total) by mouth 2 (two) times daily. 02/19/24   Vicci Barnie NOVAK, MD  pantoprazole  (PROTONIX ) 40 MG tablet Take 40 mg by mouth daily.    [provider]  phenytoin  (DILANTIN ) 100 MG ER capsule Take 2 capsules (200 mg total) by mouth at bedtime. FOR SEIZURES 02/19/24   Vicci Barnie NOVAK, MD  PHENYTOIN  INFATABS 50 MG tablet Chew 0.5 tablets (25 mg total) by mouth at bedtime. 02/19/24   Vicci Barnie NOVAK, MD  rosuvastatin  (CRESTOR ) 10 MG tablet Take 10 mg by mouth daily.    [provider]  ursodiol  (ACTIGALL ) 500 MG tablet Take 500 mg by mouth 2 (two) times daily.    [provider]    Allergies:  Patient has no known allergies.    Review of Systems  Unable to perform ROS: Dementia    Updated Vital Signs BP 105/78   Pulse 63   Temp 97.8 F (36.6 C) (Oral)   Resp 17   SpO2 95%   Physical Exam Vitals and nursing [name] reviewed.  Constitutional:      General: He is not in acute distress.    Appearance: He is well-developed. He is not ill-appearing or diaphoretic.  HENT:     Head: Normocephalic and atraumatic.     Mouth/Throat:     Comments: No tongue injury Eyes:     Pupils: Pupils are equal, round, and reactive to light.  Cardiovascular:     Rate and Rhythm: Normal rate and regular rhythm.     Heart sounds: Normal heart sounds.  Pulmonary:     Effort: Pulmonary effort is normal.     Breath sounds: Normal breath sounds.  Abdominal:     Palpations: Abdomen is soft.     Tenderness: There is no abdominal tenderness.  Skin:    General: Skin is warm and dry.  Neurological:     Mental Status: He is alert.     Comments: Awake, alert, no facial droop.  Equal strength in all 4 extremities.     (all labs ordered are listed, but only  abnormal results are displayed) Labs Reviewed  COMPREHENSIVE METABOLIC PANEL WITH GFR - Abnormal; Notable for the following components:      Result Value   Glucose, Bld 113 (*)    Alkaline Phosphatase 194 (*)    All other components within normal limits  CBC WITH DIFFERENTIAL/PLATELET - Abnormal; Notable for the following components:   RBC 4.17 (*)    All other components within normal limits  MAGNESIUM   PHENYTOIN  LEVEL, TOTAL  LEVETIRACETAM  LEVEL  LACOSAMIDE   CBG MONITORING, ED    EKG: EKG Interpretation Date/Time:  Monday June 29 2024 21:40:59 EDT Ventricular Rate:  75 PR Interval:  188 QRS Duration:  93 QT Interval:  382 QTC Calculation: 427 R Axis:   32  Text Interpretation: Sinus rhythm no acute ST/T changes similar to Aug 2025 Confirmed by Freddi Hamilton [phone]) on 06/29/2024 10:06:47 PM  Radiology: No results  found.   Procedures   Medications Ordered in the ED  levETIRAcetam  (KEPPRA ) undiluted injection 1,500 mg (1,500 mg Intravenous Given 06/29/24 2244)                                    Medical Decision Making Amount and/or Complexity of Data Reviewed External Data Reviewed: notes. Labs: ordered.    Details: Labs are overall unremarkable including a normal Dilantin  level ECG/medicine tests: ordered and independent interpretation performed.    Details: No acute ischemia   Patient presents with a breakthrough seizure.  Seems to have these once a month or so.  He was given a dose of IV Keppra .  Otherwise his levels are pending besides his unremarkable Dilantin  level.  No significant electrolyte disturbance.  On multiple reevaluations he is at what appears to be his baseline.  Discussed with his niece, given no further breakthrough seizures after multiple hours will let him go home to follow-up closely with neurology as an outpatient.  Discussed return precautions including recurrent seizures.  Stable for discharge at this time.     Final diagnoses:  Seizure [facility])    ED Discharge Orders     None          Freddi Hamilton, MD 06/29/24 [id number]

## 2024-06-30 ENCOUNTER — Ambulatory Visit: Payer: 59

## 2024-06-30 ENCOUNTER — Ambulatory Visit: Payer: Medicare (Managed Care) | Admitting: Radiation Oncology

## 2024-06-30 ENCOUNTER — Ambulatory Visit: Payer: Medicare (Managed Care)

## 2024-06-30 ENCOUNTER — Telehealth: Payer: Self-pay | Admitting: Radiation Oncology

## 2024-06-30 DIAGNOSIS — C220 Liver cell carcinoma: Secondary | ICD-10-CM

## 2024-06-30 NOTE — Telephone Encounter (Signed)
 LVM to r/s missed appt for pt.

## 2024-06-30 NOTE — Telephone Encounter (Signed)
 Reminder entered in epic for OV in 2-3- mth.

## 2024-06-30 NOTE — Telephone Encounter (Signed)
 Per ov note with Dr Lanny pt and his nephew are leaning toward radiation therapy. Do you still want to see him for OV? If so how soon. Please advise.

## 2024-07-01 ENCOUNTER — Other Ambulatory Visit: Payer: Self-pay | Admitting: *Deleted

## 2024-07-01 ENCOUNTER — Telehealth: Payer: Self-pay | Admitting: Radiation Oncology

## 2024-07-01 LAB — LEVETIRACETAM LEVEL: Levetiracetam Lvl: 27.4 ug/mL (ref 10.0–40.0)

## 2024-07-01 NOTE — Telephone Encounter (Signed)
LVM to schedule missed appt.

## 2024-07-01 NOTE — Progress Notes (Signed)
 The proposed treatment discussed in conference is for discussion purpose only and is not a binding recommendation.  The patients have not been physically examined, or presented with their treatment options.  Therefore, final treatment plans cannot be decided.

## 2024-07-03 ENCOUNTER — Telehealth: Payer: Self-pay | Admitting: Radiation Oncology

## 2024-07-03 NOTE — Telephone Encounter (Signed)
 Mailed Unable to Contact letter to verified address.

## 2024-07-03 NOTE — Telephone Encounter (Signed)
 Unable to LVM for [name]. Contacted family member [name] who advised at this time [name] is in hospital due to his own medical complications and needing partial amputation. [name] advised she has a terrible memory and is not able to schedule or transport pt since she does not drive. I advised I would send mailer to residence, verified correct address on file, so [name] can call to r/s once he is d/c. [name] was agreeable and appreciative of this.

## 2024-07-04 LAB — LACOSAMIDE: Lacosamide: 0.6 ug/mL — ABNORMAL LOW (ref 5.0–10.0)

## 2024-07-07 ENCOUNTER — Ambulatory Visit (HOSPITAL_COMMUNITY): Payer: Medicare (Managed Care) | Attending: Hematology

## 2024-07-08 ENCOUNTER — Ambulatory Visit: Payer: Medicare (Managed Care) | Admitting: Physician Assistant

## 2024-07-08 NOTE — Progress Notes (Deleted)
 Office Visit Note   Patient: [name]           Date of Birth: 06-05-1951           MRN: [id number] Visit Date: 07/08/2024              Requested by: Vicci Barnie NOVAK, MD [address] [place] [address] [place],  [place] [address] PCP: Vicci Barnie NOVAK, MD  No chief complaint on file.     HPI: The patient is a 73 year old gentleman who was seen while at [facility] for right ankle pain.  He was found to have Weber type B fibular fracture minimally displaced.  He was placed in a cam boot for protection WBAT.  He is here for exam and follow up x ray.               Past medical history includes: Dementia he resides in SNF.  He is not a diabetic.  He is managed for hyperlipidemia and seizures.   He was first seen on 06/17/24  placed in a Cam Boot WBAT with elevation at rest for edema.   Assessment & Plan: Visit Diagnoses: No diagnosis found.  Plan: ***  Follow-Up Instructions: No follow-ups on file.   Ortho Exam  Patient is alert, oriented, no adenopathy, well-dressed, normal affect, normal respiratory effort. ***    Imaging: No results found. No images are attached to the encounter.  Labs: Lab Results  Component Value Date   HGBA1C 5.3 05/23/2016   LABURIC 5.0 03/23/2015   REPTSTATUS 01/15/2024 FINAL 01/10/2024   GRAMSTAIN CYTOSPIN NO WBC SEEN NO ORGANISMS SEEN 03/11/2009   CULT  01/10/2024    NO GROWTH 5 DAYS Performed at [facility], [address] [place], [place] [address]      Lab Results  Component Value Date   ALBUMIN 4.2 06/29/2024   ALBUMIN 3.7 05/31/2024   ALBUMIN 3.8 05/31/2024    Lab Results  Component Value Date   MG 2.4 06/29/2024   MG 1.7 06/01/2024   MG 1.9 01/10/2024   Lab Results  Component Value Date   VD25OH 68.0 11/21/2021   VD25OH 71.4 03/13/2017    No results found for: PREALBUMIN    Latest Ref Rng & Units 06/29/2024    9:51 PM 06/01/2024    5:35 AM 05/31/2024    9:37 PM  CBC EXTENDED  WBC 4.0 - 10.5 K/uL 6.7   8.2  10.7   RBC 4.22 - 5.81 MIL/uL 4.17  4.22  4.27   Hemoglobin 13.0 - 17.0 g/dL 86.1  86.6  86.3   HCT 39.0 - 52.0 % 40.2  39.4  39.8   Platelets 150 - 400 K/uL 183  166  168   NEUT# 1.7 - 7.7 K/uL 4.3   8.3   Lymph# 0.7 - 4.0 K/uL 1.4   1.4      There is no height or weight on file to calculate BMI.  Orders:  No orders of the defined types were placed in this encounter.  No orders of the defined types were placed in this encounter.    Procedures: No procedures performed  Clinical Data: No additional findings.  ROS:  All other systems negative, except as noted in the HPI. Review of Systems  Objective: Vital Signs: There were no vitals taken for this visit.  Specialty Comments:  No specialty comments available.  PMFS History: Patient Active Problem List   Diagnosis Date Noted   Hepatocellular carcinoma (  HCC) 06/25/2024   Breakthrough seizure (HCC) 06/01/2024   Closed nondisplaced fracture of lateral malleolus of right fibula 06/01/2024   Community acquired pneumonia 01/10/2024   ARF (acute renal failure) (HCC) 01/10/2024   GERD (gastroesophageal reflux disease) 01/10/2024   Pneumonia 01/10/2024   Thrombocytopenia (HCC) 12/29/2023   Acute metabolic encephalopathy 12/29/2023   Liver mass 12/29/2023   Influenza A 12/29/2023   Seizure disorder (HCC) 12/29/2023   Dementia (HCC) 12/29/2023   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   Vascular dementia with other behavioral disturbance, unspecified dementia severity (HCC) 08/20/2023   Pain due to onychomycosis of toenails of both feet 02/28/2023   Sepsis (HCC) 02/23/2023   Barrett's esophagus 12/25/2021   Bell's palsy 08/23/2020   Elevated alkaline phosphatase level 11/17/2019   Adenomatous polyp of colon 09/02/2017   Functional urinary incontinence 09/02/2017   Hemiparesis and alteration of sensations as late effects of stroke (HCC) 01/25/2017   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/23/2015   Band keratopathy of both eyes  03/22/2015   Hip fracture (HCC) 11/26/2013   break through seizure 11/26/2013   Memory disorder 03/04/2013   Depressive disorder, not elsewhere classified 07/24/2012   Abnormality of gait 07/24/2012   Focal epilepsy with impairment of consciousness (HCC) 07/24/2012   Cerebral thrombosis with cerebral infarction (HCC) 07/24/2012   Lesion of ulnar nerve 07/24/2012   Past Medical History:  Diagnosis Date   Alcoholism (HCC)    History of, not active   Chronic hepatitis C (HCC)    Dementia (HCC)    Diverticulosis    Gait disorder    Gingival hypertrophy    Secondary to Dilantin    Hemiparesis and alteration of sensations as late effects of stroke (HCC) 01/25/2017   Left hemiparesis   Hiatal hernia    History of alcoholism (HCC)    Hx of ischemic vertebrobasilar artery thalamic stroke    Right   Internal hemorrhoids    Memory disorder 03/04/2013   Primary biliary cirrhosis (HCC)    Seizures (HCC)    Stroke (HCC)    Right frontal, right thalamic   Tubular adenoma of colon    Ulnar neuropathy of left upper extremity     Family History  Problem Relation Age of Onset   Pulmonary embolism Mother    Liver disease Father    Diabetes Sister    Hypertension Sister    Hypertension Sister    Hypertension Sister    Seizures Neg Hx     Past Surgical History:  Procedure Laterality Date   COLONOSCOPY WITH PROPOFOL  N/A 07/10/2017   Procedure: COLONOSCOPY WITH PROPOFOL ;  Surgeon: Elicia Claw, MD;  Location: [facility];  Service: Gastroenterology;  Laterality: N/A;   ESOPHAGOGASTRODUODENOSCOPY (EGD) WITH PROPOFOL  N/A 07/10/2017   Procedure: ESOPHAGOGASTRODUODENOSCOPY (EGD) WITH PROPOFOL ;  Surgeon: Elicia Claw, MD;  Location: [facility];  Service: Gastroenterology;  Laterality: N/A;   HIP ARTHROPLASTY Left 11/26/2013   Procedure: LEFT HIP HEMIARTHROPLASTY;  Surgeon: [name], MD;  Location: [facility];  Service: Orthopedics;  Laterality: Left;   WRIST SURGERY Left 95 &  96   Social History   Occupational History   Not on file  Tobacco Use   Smoking status: Former    Current packs/day: 0.00    Types: Cigarettes    Quit date: 01/02/2014    Years since quitting: 10.5   Smokeless tobacco: Former  Building services engineer status: Never Used  Substance and Sexual Activity   Alcohol use: No    Comment: Former Alcoholic,  quit at age of 86   Drug use: No   Sexual activity: Yes

## 2024-07-14 ENCOUNTER — Telehealth: Payer: Self-pay | Admitting: Radiation Oncology

## 2024-07-14 NOTE — Telephone Encounter (Signed)
 Able to schedule appt with pt's caregiver/family member.

## 2024-07-14 NOTE — Progress Notes (Signed)
 PATIENT NAVIGATOR PROGRESS NOTE  Name: [name] Date: 07/14/2024 MRN: [id number]  DOB: [birth year]   Reason for visit:  Phone Call Follow-up  Comments:   Called and spoke to patient's nephew [name] regarding patient's missed appts.  Patient's nephew stated he was dealing with some personal health issues and was unable to bring patient to his appointments. Patient's nephew has since recovered and is agreeable to have patient's Rad/Onc Consult and CT Chest rescheduled.  Informed nephew I would reach out to the scheduling department for both and that they would be in contact to get patient rescheduled.    Informed patient's nephew to call office if he had any questions or concerns.    Time spent counseling/coordinating care: 30-45 minutes

## 2024-07-21 ENCOUNTER — Ambulatory Visit (HOSPITAL_COMMUNITY): Payer: Medicare (Managed Care)

## 2024-07-22 ENCOUNTER — Ambulatory Visit: Payer: Medicare (Managed Care) | Admitting: Physician Assistant

## 2024-07-22 NOTE — Progress Notes (Deleted)
 Office Visit Note   Patient: [name]           Date of Birth: November 19, 1950           MRN: [id number] Visit Date: 07/22/2024              Requested by: Vicci Barnie NOVAK, MD [address] [place] [address] [place],  [place] [address] PCP: Vicci Barnie NOVAK, MD  No chief complaint on file.     HPI: The patient is a 73 year old gentleman who was seen while at [facility] for right ankle pain.  He was found to have Weber type B fibular fracture minimally displaced.  He was placed in a cam boot for protection WBAT.  He is here for exam and follow up x ray.               Past medical history includes: Dementia he resides in SNF.  He is not a diabetic.  He is managed for hyperlipidemia and seizures.   He was last seen on 06/17/24 plan was to Wear Cam boot for activities and protection. WBAT in cam boot. Shower as needed and remove for skin checks.   Assessment & Plan: Visit Diagnoses: No diagnosis found.  Plan: ***  Follow-Up Instructions: No follow-ups on file.   Ortho Exam  Patient is alert, oriented, no adenopathy, well-dressed, normal affect, normal respiratory effort. ***    Imaging: Healed Weber type B fibular fracture  Labs: Lab Results  Component Value Date   HGBA1C 5.3 05/23/2016   LABURIC 5.0 03/23/2015   REPTSTATUS 01/15/2024 FINAL 01/10/2024   GRAMSTAIN CYTOSPIN NO WBC SEEN NO ORGANISMS SEEN 03/11/2009   CULT  01/10/2024    NO GROWTH 5 DAYS Performed at [facility], [address], [place], [place] [address]      Lab Results  Component Value Date   ALBUMIN 4.2 06/29/2024   ALBUMIN 3.7 05/31/2024   ALBUMIN 3.8 05/31/2024    Lab Results  Component Value Date   MG 2.4 06/29/2024   MG 1.7 06/01/2024   MG 1.9 01/10/2024   Lab Results  Component Value Date   VD25OH 68.0 11/21/2021   VD25OH 71.4 03/13/2017    No results found for: PREALBUMIN    Latest Ref Rng & Units 06/29/2024    9:51 PM 06/01/2024    5:35 AM 05/31/2024    9:37 PM  CBC  EXTENDED  WBC 4.0 - 10.5 K/uL 6.7  8.2  10.7   RBC 4.22 - 5.81 MIL/uL 4.17  4.22  4.27   Hemoglobin 13.0 - 17.0 g/dL 86.1  86.6  86.3   HCT 39.0 - 52.0 % 40.2  39.4  39.8   Platelets 150 - 400 K/uL 183  166  168   NEUT# 1.7 - 7.7 K/uL 4.3   8.3   Lymph# 0.7 - 4.0 K/uL 1.4   1.4      There is no height or weight on file to calculate BMI.  Orders:  No orders of the defined types were placed in this encounter.  No orders of the defined types were placed in this encounter.    Procedures: No procedures performed  Clinical Data: No additional findings.  ROS:  All other systems negative, except as noted in the HPI. Review of Systems  Objective: Vital Signs: There were no vitals taken for this visit.  Specialty Comments:  No specialty comments available.  PMFS History: Patient Active Problem List   Diagnosis Date  Noted   Hepatocellular carcinoma (HCC) 06/25/2024   Breakthrough seizure (HCC) 06/01/2024   Closed nondisplaced fracture of lateral malleolus of right fibula 06/01/2024   Community acquired pneumonia 01/10/2024   ARF (acute renal failure) 01/10/2024   GERD (gastroesophageal reflux disease) 01/10/2024   Pneumonia 01/10/2024   Thrombocytopenia 12/29/2023   Acute metabolic encephalopathy 12/29/2023   Liver mass 12/29/2023   Influenza A 12/29/2023   Seizure disorder (HCC) 12/29/2023   Dementia (HCC) 12/29/2023   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   Vascular dementia with other behavioral disturbance, unspecified dementia severity (HCC) 08/20/2023   Pain due to onychomycosis of toenails of both feet 02/28/2023   Sepsis (HCC) 02/23/2023   Barrett's esophagus 12/25/2021   Bell's palsy 08/23/2020   Elevated alkaline phosphatase level 11/17/2019   Adenomatous polyp of colon 09/02/2017   Functional urinary incontinence 09/02/2017   Hemiparesis and alteration of sensations as late effects of stroke (HCC) 01/25/2017   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/23/2015   Band  keratopathy of both eyes 03/22/2015   Hip fracture (HCC) 11/26/2013   break through seizure 11/26/2013   Memory disorder 03/04/2013   Depressive disorder, not elsewhere classified 07/24/2012   Abnormality of gait 07/24/2012   Focal epilepsy with impairment of consciousness (HCC) 07/24/2012   Cerebral thrombosis with cerebral infarction (HCC) 07/24/2012   Lesion of ulnar nerve 07/24/2012   Past Medical History:  Diagnosis Date   Alcoholism (HCC)    History of, not active   Chronic hepatitis C (HCC)    Dementia (HCC)    Diverticulosis    Gait disorder    Gingival hypertrophy    Secondary to Dilantin    Hemiparesis and alteration of sensations as late effects of stroke (HCC) 01/25/2017   Left hemiparesis   Hiatal hernia    History of alcoholism (HCC)    Hx of ischemic vertebrobasilar artery thalamic stroke    Right   Internal hemorrhoids    Memory disorder 03/04/2013   Primary biliary cirrhosis (HCC)    Seizures (HCC)    Stroke (HCC)    Right frontal, right thalamic   Tubular adenoma of colon    Ulnar neuropathy of left upper extremity     Family History  Problem Relation Age of Onset   Pulmonary embolism Mother    Liver disease Father    Diabetes Sister    Hypertension Sister    Hypertension Sister    Hypertension Sister    Seizures Neg Hx     Past Surgical History:  Procedure Laterality Date   COLONOSCOPY WITH PROPOFOL  N/A 07/10/2017   Procedure: COLONOSCOPY WITH PROPOFOL ;  Surgeon: Elicia Claw, MD;  Location: [facility];  Service: Gastroenterology;  Laterality: N/A;   ESOPHAGOGASTRODUODENOSCOPY (EGD) WITH PROPOFOL  N/A 07/10/2017   Procedure: ESOPHAGOGASTRODUODENOSCOPY (EGD) WITH PROPOFOL ;  Surgeon: Elicia Claw, MD;  Location: [facility];  Service: Gastroenterology;  Laterality: N/A;   HIP ARTHROPLASTY Left 11/26/2013   Procedure: LEFT HIP HEMIARTHROPLASTY;  Surgeon: [name], MD;  Location: [facility];  Service: Orthopedics;  Laterality: Left;    WRIST SURGERY Left 95 & 96   Social History   Occupational History   Not on file  Tobacco Use   Smoking status: Former    Current packs/day: 0.00    Types: Cigarettes    Quit date: 01/02/2014    Years since quitting: 10.5   Smokeless tobacco: Former  Building services engineer status: Never Used  Substance and Sexual Activity   Alcohol use: No  Comment: Former Alcoholic, quit at age of 68   Drug use: No   Sexual activity: Yes

## 2024-07-22 NOTE — Progress Notes (Signed)
 PATIENT NAVIGATOR PROGRESS NOTE  Name: NICKI GRACY Date: 07/22/2024 MRN: [id number]  DOB: October 01, 1951   Patient was scheduled for CT Chest on 9/30 but was not able to have test completed due to a power outage at our facility. Called and spoke to patient's nephew Lorella, who stated he was waiting for scheduling to contact him.  [name] was given the number for Central Scheduling and was instructed to contact them to reschedule patient's scan.  [name] verbalized understanding and agreed to call Central Scheduling.    Time spent counseling/coordinating care: 15-30 minutes

## 2024-07-24 ENCOUNTER — Ambulatory Visit (HOSPITAL_COMMUNITY): Payer: Medicare (Managed Care) | Attending: Hematology

## 2024-07-27 NOTE — Progress Notes (Signed)
 GI Location of Tumor / Histology: Hepatocellular Carcinoma  [name] presented with abdominal pain.   MRI Abd 04/09/2023: Right hepatic lobe segment hyperintense lesion measuring 3.2 cm.  This lesion was previously measured stable at 2.4 cm on prior CT 12/29/2023.   Past/Anticipated interventions by surgeon, if any:    Past/Anticipated interventions by medical oncology, if any:  Dr. Lanny -Not a good candidate for liver transplant. -Refer to IR to discuss ablative options. -Refer to radiation oncology to discuss SBRT if IR procedures are not offered.   Weight changes, if any: Stable  Bowel/Bladder complaints, if any: No changes per family  Nausea / Vomiting, if any: No changes per family.  Pain issues, if any:  None noted by family.  Eating and drinking well.    SAFETY ISSUES: Prior radiation? No Pacemaker/ICD? No Possible current pregnancy? N/a Is the patient on methotrexate? No  Current Complaints/Details:

## 2024-07-27 NOTE — Progress Notes (Signed)
 PATIENT NAVIGATOR PROGRESS NOTE  Name: [name] Date: 07/27/2024 MRN: [id number]  DOB: 1951-04-25   Reason for visit:  Missed CT scan appt  Comments:   Patient was scheduled for CT Chest on 10/3 but did not show for his appt.  Called and left voice message with [name], patient's nephew and point of contact. The number for central scheduling was provided during the voice message.  Patient is scheduled for consult with Dr. Dewey on 10/7.   Time spent counseling/coordinating care: 15 to 30 minutes

## 2024-07-27 NOTE — Progress Notes (Signed)
 Radiation Oncology         [phone] ________________________________  Name: [name]        MRN: [id number]  Date of Service: 07/28/2024 DOB: 04/02/1951  RR:Gnywdnw, Barnie NOVAK, MD  Lanny Callander, MD     REFERRING PHYSICIAN: Lanny Callander, MD   DIAGNOSIS: The encounter diagnosis was Hepatocellular carcinoma [facility]).   HISTORY OF PRESENT ILLNESS: [name] is a 73 y.o. male seen at the request of Dr. Lanny for a diagnosis of hepatocellular carcinoma. The patient has a history of primary biliary cirrhosis, seizure disorder, dementia and prior CVA. He had a CT abdomen pelvis in March due to abdominal pain and this showed new segment 8 mass in the right liver measuring 2.2 cm, with enhancement, and hypodense segment 2 lesion measuring 1.4 cm. There was another subcentimeter lesion in the right liver too small to characterize as well but unchanged from 2018.  An MRI on 04/08/24 showed persistent segment 4A/8 lesion measuring 3.2 cm described as LI-RADS 5. An AFP was drawn on 05/14/24 and was 1.5. He was referred to oncology and Dr. Lanny did not think he was a good candidate for liver transplant. He was referred to IR to discuss ablative options of treatment, and seen today to consider stereotactic body radiotherapy (SBRT) if IR procedures are not offered.    PREVIOUS RADIATION THERAPY: {EXAM; YES/NO:19492::No}   PAST MEDICAL HISTORY:  Past Medical History:  Diagnosis Date   Alcoholism (HCC)    History of, not active   Chronic hepatitis C (HCC)    Dementia (HCC)    Diverticulosis    Gait disorder    Gingival hypertrophy    Secondary to Dilantin    Hemiparesis and alteration of sensations as late effects of stroke (HCC) 01/25/2017   Left hemiparesis   Hiatal hernia    History of alcoholism (HCC)    Hx of ischemic vertebrobasilar artery thalamic stroke    Right   Internal hemorrhoids    Memory disorder 03/04/2013   Primary biliary cirrhosis (HCC)    Seizures (HCC)    Stroke (HCC)     Right frontal, right thalamic   Tubular adenoma of colon    Ulnar neuropathy of left upper extremity        PAST SURGICAL HISTORY: Past Surgical History:  Procedure Laterality Date   COLONOSCOPY WITH PROPOFOL  N/A 07/10/2017   Procedure: COLONOSCOPY WITH PROPOFOL ;  Surgeon: Elicia Claw, MD;  Location: [facility];  Service: Gastroenterology;  Laterality: N/A;   ESOPHAGOGASTRODUODENOSCOPY (EGD) WITH PROPOFOL  N/A 07/10/2017   Procedure: ESOPHAGOGASTRODUODENOSCOPY (EGD) WITH PROPOFOL ;  Surgeon: Elicia Claw, MD;  Location: [facility];  Service: Gastroenterology;  Laterality: N/A;   HIP ARTHROPLASTY Left 11/26/2013   Procedure: LEFT HIP HEMIARTHROPLASTY;  Surgeon: [name], MD;  Location: [facility];  Service: Orthopedics;  Laterality: Left;   WRIST SURGERY Left 95 & 96     FAMILY HISTORY:  Family History  Problem Relation Age of Onset   Pulmonary embolism Mother    Liver disease Father    Diabetes Sister    Hypertension Sister    Hypertension Sister    Hypertension Sister    Seizures Neg Hx      SOCIAL HISTORY:  reports that he quit smoking about 10 years ago. His smoking use included cigarettes. He has quit using smokeless tobacco. He reports that he does not drink alcohol and does not use drugs.   ALLERGIES: Patient has no known allergies.   MEDICATIONS:  Current Outpatient Medications  Medication Sig Dispense Refill   aspirin  EC 81 MG tablet Take 1 tablet (81 mg total) by mouth daily. 100 tablet 0   Lacosamide  100 MG TABS Take 1 tablet (100 mg total) by mouth in the morning and at bedtime. (Patient not taking: Reported on 06/24/2024) 180 tablet 3   Lacosamide  150 MG TABS Take 1 tablet (150 mg total) by mouth 2 (two) times daily. 60 tablet 0   levETIRAcetam  (KEPPRA ) 750 MG tablet Take 1,500 mg by mouth 2 (two) times daily.     loratadine  (CLARITIN ) 10 MG tablet Take 10 mg by mouth daily.     memantine  (NAMENDA ) 10 MG tablet Take 1 tablet (10 mg total)  by mouth 2 (two) times daily. 180 tablet 0   pantoprazole  (PROTONIX ) 40 MG tablet Take 40 mg by mouth daily.     phenytoin  (DILANTIN ) 100 MG ER capsule Take 2 capsules (200 mg total) by mouth at bedtime. FOR SEIZURES 180 capsule 2   PHENYTOIN  INFATABS 50 MG tablet Chew 0.5 tablets (25 mg total) by mouth at bedtime. 45 tablet 1   rosuvastatin  (CRESTOR ) 10 MG tablet Take 10 mg by mouth daily.     ursodiol  (ACTIGALL ) 500 MG tablet Take 500 mg by mouth 2 (two) times daily.     No current facility-administered medications for this visit.     REVIEW OF SYSTEMS: On review of systems, the patient reports that *** is doing well overall. *** denies any chest pain, shortness of breath, cough, fevers, chills, night sweats, unintended weight changes. *** denies any bowel or bladder disturbances, and denies abdominal pain, nausea or vomiting. *** denies any new musculoskeletal or joint aches or pains. A complete review of systems is obtained and is otherwise negative.     PHYSICAL EXAM:  Wt Readings from Last 3 Encounters:  06/24/24 169 lb 11.2 oz (77 kg)  06/01/24 171 lb 1.2 oz (77.6 kg)  05/14/24 173 lb (78.5 kg)   Temp Readings from Last 3 Encounters:  06/29/24 97.8 F (36.6 C) (Oral)  06/24/24 98.5 F (36.9 C) (Temporal)  06/02/24 98.4 F (36.9 C)   BP Readings from Last 3 Encounters:  06/29/24 108/67  06/24/24 104/70  06/02/24 112/75   Pulse Readings from Last 3 Encounters:  06/29/24 (!) 58  06/24/24 (!) 103  06/02/24 87    /10  In general this is a well appearing *** in no acute distress. ***'s alert and oriented x4 and appropriate throughout the examination. Cardiopulmonary assessment is negative for acute distress and *** exhibits normal effort.     ECOG = ***  0 - Asymptomatic (Fully active, able to carry on all predisease activities without restriction)  1 - Symptomatic but completely ambulatory (Restricted in physically strenuous activity but ambulatory and able to carry  out work of a light or sedentary nature. For example, light housework, office work)  2 - Symptomatic, <50% in bed during the day (Ambulatory and capable of all self care but unable to carry out any work activities. Up and about more than 50% of waking hours)  3 - Symptomatic, >50% in bed, but not bedbound (Capable of only limited self-care, confined to bed or chair 50% or more of waking hours)  4 - Bedbound (Completely disabled. Cannot carry on any self-care. Totally confined to bed or chair)  5 - Death   Raylene MM, Creech RH, Tormey DC, et al. [phone]). Toxicity and response criteria of the [facility]. Am.  J. Clin. Oncol. 5 (6): 649-55    LABORATORY DATA:  Lab Results  Component Value Date   WBC 6.7 06/29/2024   HGB 13.8 06/29/2024   HCT 40.2 06/29/2024   MCV 96.4 06/29/2024   PLT 183 06/29/2024   Lab Results  Component Value Date   NA 142 06/29/2024   K 4.7 06/29/2024   CL 105 06/29/2024   CO2 24 06/29/2024   Lab Results  Component Value Date   ALT 10 06/29/2024   AST 18 06/29/2024   GGT 112 (H) 09/11/2019   ALKPHOS 194 (H) 06/29/2024   BILITOT 0.3 06/29/2024      RADIOGRAPHY: No results found.     IMPRESSION/PLAN: 1. Clinical diagnosis of hepatocellular carcinoma. Dr. Dewey discusses the pathology findings and reviews the nature of ***   We discussed the risks, benefits, short, and long term effects of radiotherapy, as well as the curative intent, and the patient is interested in proceeding. Dr. Dewey discusses the delivery and logistics of radiotherapy and anticipates a course of *** .  Dr. Dewey also discusses the rationale for fiducial marker placement, this has been outlined and a referral will be placed to have this performed prior to stereotactic body radiotherapy.   In a visit lasting *** minutes, greater than 50% of the time was spent face to face discussing the patient's condition, in preparation for the discussion, and coordinating the  patient's care.   The above documentation reflects my direct findings during this shared patient visit. Please see the separate note by Dr. Dewey on this date for the remainder of the patient's plan of care.    Donald KYM Husband, [facility]   **Disclaimer: This note was dictated with voice recognition software. Similar sounding words can inadvertently be transcribed and this note may contain transcription errors which may not have been corrected upon publication of note.**

## 2024-07-28 ENCOUNTER — Ambulatory Visit
Admission: RE | Admit: 2024-07-28 | Discharge: 2024-07-28 | Disposition: A | Discharge status: Home / Self Care | Payer: Medicare (Managed Care) | Source: Ambulatory Visit | Attending: Radiation Oncology | Admitting: Radiation Oncology

## 2024-07-28 ENCOUNTER — Encounter: Payer: Self-pay | Admitting: Radiation Oncology

## 2024-07-28 VITALS — BP 138/78 | HR 66 | Temp 98.0°F | Resp 19 | Ht 69.0 in | Wt 170.2 lb

## 2024-07-28 DIAGNOSIS — B182 Chronic viral hepatitis C: Secondary | ICD-10-CM | POA: Diagnosis not present

## 2024-07-28 DIAGNOSIS — Z79899 Other long term (current) drug therapy: Secondary | ICD-10-CM | POA: Diagnosis not present

## 2024-07-28 DIAGNOSIS — F0390 Unspecified dementia without behavioral disturbance: Secondary | ICD-10-CM | POA: Insufficient documentation

## 2024-07-28 DIAGNOSIS — C220 Liver cell carcinoma: Secondary | ICD-10-CM | POA: Diagnosis present

## 2024-07-28 DIAGNOSIS — K449 Diaphragmatic hernia without obstruction or gangrene: Secondary | ICD-10-CM | POA: Diagnosis not present

## 2024-07-28 DIAGNOSIS — Z860101 Personal history of adenomatous and serrated colon polyps: Secondary | ICD-10-CM | POA: Diagnosis not present

## 2024-07-28 DIAGNOSIS — F1011 Alcohol abuse, in remission: Secondary | ICD-10-CM | POA: Diagnosis not present

## 2024-07-28 DIAGNOSIS — G40909 Epilepsy, unspecified, not intractable, without status epilepticus: Secondary | ICD-10-CM | POA: Diagnosis not present

## 2024-07-28 DIAGNOSIS — Z7982 Long term (current) use of aspirin: Secondary | ICD-10-CM | POA: Insufficient documentation

## 2024-07-28 DIAGNOSIS — I69354 Hemiplegia and hemiparesis following cerebral infarction affecting left non-dominant side: Secondary | ICD-10-CM | POA: Insufficient documentation

## 2024-07-28 DIAGNOSIS — Z87891 Personal history of nicotine dependence: Secondary | ICD-10-CM | POA: Insufficient documentation

## 2024-07-28 DIAGNOSIS — K743 Primary biliary cirrhosis: Secondary | ICD-10-CM | POA: Insufficient documentation

## 2024-07-29 ENCOUNTER — Other Ambulatory Visit: Payer: Self-pay | Admitting: Radiation Oncology

## 2024-07-29 DIAGNOSIS — C220 Liver cell carcinoma: Secondary | ICD-10-CM

## 2024-08-04 ENCOUNTER — Ambulatory Visit (HOSPITAL_COMMUNITY): Admission: RE | Admit: 2024-08-04 | Payer: Medicare (Managed Care) | Source: Ambulatory Visit

## 2024-08-04 ENCOUNTER — Ambulatory Visit: Payer: Medicare (Managed Care) | Admitting: Physician Assistant

## 2024-08-08 ENCOUNTER — Ambulatory Visit (HOSPITAL_COMMUNITY): Admission: RE | Admit: 2024-08-08 | Payer: Medicare (Managed Care) | Source: Ambulatory Visit

## 2024-08-10 ENCOUNTER — Encounter: Payer: Self-pay | Admitting: Physician Assistant

## 2024-08-10 ENCOUNTER — Ambulatory Visit: Payer: Medicare (Managed Care) | Admitting: Physician Assistant

## 2024-08-10 ENCOUNTER — Other Ambulatory Visit: Payer: Self-pay

## 2024-08-10 DIAGNOSIS — S82401A Unspecified fracture of shaft of right fibula, initial encounter for closed fracture: Secondary | ICD-10-CM | POA: Diagnosis not present

## 2024-08-10 DIAGNOSIS — M25571 Pain in right ankle and joints of right foot: Secondary | ICD-10-CM

## 2024-08-10 NOTE — Progress Notes (Signed)
 Office Visit Note   Patient: [name]           Date of Birth: 1951/06/05           MRN: [id number] Visit Date: 08/10/2024              Requested by: Vicci Barnie NOVAK, MD [address] [place],  [place] [address] PCP: Vicci Barnie NOVAK, MD  No chief complaint on file.     HPI: The patient is a 73 year old gentleman who was seen while at [facility] for right ankle pain.  He was found to have Weber type B fibular fracture minimally displaced.  He was placed in a cam boot for protection WBAT.  He is here for exam and follow up x ray.  On his last visit he was WBAT in cam boot.    Assessment & Plan: Visit Diagnoses: No diagnosis found.  Plan: WBAT in comfortable shoe.  Walk with walker for safety.    Follow-Up Instructions: No follow-ups on file.   Ortho Exam  Patient is alert, oriented, no adenopathy, well-dressed, normal affect, normal respiratory effort. Minimal lateral malleolus edema, no tissue loss, no cellulitis. NTTP over the lateral malleolus. Foot warm and well perfused without skin changes or open wounds.     Imaging: Weber B fibular fracture minimally displaced with evidence of bone healing on x ray.  Labs: Lab Results  Component Value Date   HGBA1C 5.3 05/23/2016   LABURIC 5.0 03/23/2015   REPTSTATUS 01/15/2024 FINAL 01/10/2024   GRAMSTAIN CYTOSPIN NO WBC SEEN NO ORGANISMS SEEN 03/11/2009   CULT  01/10/2024    NO GROWTH 5 DAYS Performed at [facility], [address] [place], [place] [address]      Lab Results  Component Value Date   ALBUMIN 4.2 06/29/2024   ALBUMIN 3.7 05/31/2024   ALBUMIN 3.8 05/31/2024    Lab Results  Component Value Date   MG 2.4 06/29/2024   MG 1.7 06/01/2024   MG 1.9 01/10/2024   Lab Results  Component Value Date   VD25OH 68.0 11/21/2021   VD25OH 71.4 03/13/2017    No results found for: PREALBUMIN    Latest Ref Rng & Units 06/29/2024    9:51 PM 06/01/2024    5:35 AM 05/31/2024     9:37 PM  CBC EXTENDED  WBC 4.0 - 10.5 K/uL 6.7  8.2  10.7   RBC 4.22 - 5.81 MIL/uL 4.17  4.22  4.27   Hemoglobin 13.0 - 17.0 g/dL 86.1  86.6  86.3   HCT 39.0 - 52.0 % 40.2  39.4  39.8   Platelets 150 - 400 K/uL 183  166  168   NEUT# 1.7 - 7.7 K/uL 4.3   8.3   Lymph# 0.7 - 4.0 K/uL 1.4   1.4      There is no height or weight on file to calculate BMI.  Orders:  No orders of the defined types were placed in this encounter.  No orders of the defined types were placed in this encounter.    Procedures: No procedures performed  Clinical Data: No additional findings.  ROS:  All other systems negative, except as noted in the HPI. Review of Systems  Objective: Vital Signs: There were no vitals taken for this visit.  Specialty Comments:  No specialty comments available.  PMFS History: Patient Active Problem List   Diagnosis Date Noted   Hepatocellular carcinoma (HCC) 06/25/2024   Breakthrough seizure (  HCC) 06/01/2024   Closed nondisplaced fracture of lateral malleolus of right fibula 06/01/2024   Community acquired pneumonia 01/10/2024   ARF (acute renal failure) 01/10/2024   GERD (gastroesophageal reflux disease) 01/10/2024   Pneumonia 01/10/2024   Thrombocytopenia 12/29/2023   Acute metabolic encephalopathy 12/29/2023   Liver mass 12/29/2023   Influenza A 12/29/2023   Seizure disorder (HCC) 12/29/2023   Dementia (HCC) 12/29/2023   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   Vascular dementia with other behavioral disturbance, unspecified dementia severity (HCC) 08/20/2023   Pain due to onychomycosis of toenails of both feet 02/28/2023   Sepsis (HCC) 02/23/2023   Barrett's esophagus 12/25/2021   Bell's palsy 08/23/2020   Elevated alkaline phosphatase level 11/17/2019   Adenomatous polyp of colon 09/02/2017   Functional urinary incontinence 09/02/2017   Hemiparesis and alteration of sensations as late effects of stroke (HCC) 01/25/2017   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/23/2015    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   Hip fracture (HCC) 11/26/2013   break through seizure 11/26/2013   Memory disorder 03/04/2013   Depressive disorder, not elsewhere classified 07/24/2012   Abnormality of gait 07/24/2012   Focal epilepsy with impairment of consciousness (HCC) 07/24/2012   Cerebral thrombosis with cerebral infarction (HCC) 07/24/2012   Lesion of ulnar nerve 07/24/2012   Past Medical History:  Diagnosis Date   Alcoholism (HCC)    History of, not active   Chronic hepatitis C (HCC)    Dementia (HCC)    Diverticulosis    Gait disorder    Gingival hypertrophy    Secondary to Dilantin    Hemiparesis and alteration of sensations as late effects of stroke (HCC) 01/25/2017   Left hemiparesis   Hiatal hernia    History of alcoholism (HCC)    Hx of ischemic vertebrobasilar artery thalamic stroke    Right   Internal hemorrhoids    Memory disorder 03/04/2013   Primary biliary cirrhosis (HCC)    Seizures (HCC)    Stroke (HCC)    Right frontal, right thalamic   Tubular adenoma of colon    Ulnar neuropathy of left upper extremity     Family History  Problem Relation Age of Onset   Pulmonary embolism Mother    Liver disease Father    Diabetes Sister    Hypertension Sister    Hypertension Sister    Hypertension Sister    Seizures Neg Hx     Past Surgical History:  Procedure Laterality Date   COLONOSCOPY WITH PROPOFOL  N/A 07/10/2017   Procedure: COLONOSCOPY WITH PROPOFOL ;  Surgeon: Elicia Claw, MD;  Location: [facility];  Service: Gastroenterology;  Laterality: N/A;   ESOPHAGOGASTRODUODENOSCOPY (EGD) WITH PROPOFOL  N/A 07/10/2017   Procedure: ESOPHAGOGASTRODUODENOSCOPY (EGD) WITH PROPOFOL ;  Surgeon: Elicia Claw, MD;  Location: [facility];  Service: Gastroenterology;  Laterality: N/A;   HIP ARTHROPLASTY Left 11/26/2013   Procedure: LEFT HIP HEMIARTHROPLASTY;  Surgeon: [name], MD;  Location: [facility];  Service: Orthopedics;  Laterality:  Left;   WRIST SURGERY Left 95 & 96   Social History   Occupational History   Not on file  Tobacco Use   Smoking status: Former    Current packs/day: 0.00    Types: Cigarettes    Quit date: 01/02/2014    Years since quitting: 10.6   Smokeless tobacco: Former  Building services engineer status: Never Used  Substance and Sexual Activity   Alcohol use: No    Comment: Former Alcoholic, quit at age of 77   Drug  use: No   Sexual activity: Yes

## 2024-08-10 NOTE — Progress Notes (Incomplete)
 Chief Complaint: Patient was seen in consultation today for hepatocellular carcinoma  Referring Physician(s): Pyrtle,Jay M  History of Present Illness: [name] is a 72 y.o. male with a medical history significant for   Past Medical History:  Diagnosis Date   Alcoholism (HCC)    History of, not active   Chronic hepatitis C (HCC)    Dementia (HCC)    Diverticulosis    Gait disorder    Gingival hypertrophy    Secondary to Dilantin    Hemiparesis and alteration of sensations as late effects of stroke (HCC) 01/25/2017   Left hemiparesis   Hiatal hernia    History of alcoholism (HCC)    Hx of ischemic vertebrobasilar artery thalamic stroke    Right   Internal hemorrhoids    Memory disorder 03/04/2013   Primary biliary cirrhosis (HCC)    Seizures (HCC)    Stroke (HCC)    Right frontal, right thalamic   Tubular adenoma of colon    Ulnar neuropathy of left upper extremity     Past Surgical History:  Procedure Laterality Date   COLONOSCOPY WITH PROPOFOL  N/A 07/10/2017   Procedure: COLONOSCOPY WITH PROPOFOL ;  Surgeon: Elicia Claw, MD;  Location: [facility];  Service: Gastroenterology;  Laterality: N/A;   ESOPHAGOGASTRODUODENOSCOPY (EGD) WITH PROPOFOL  N/A 07/10/2017   Procedure: ESOPHAGOGASTRODUODENOSCOPY (EGD) WITH PROPOFOL ;  Surgeon: Elicia Claw, MD;  Location: [facility];  Service: Gastroenterology;  Laterality: N/A;   HIP ARTHROPLASTY Left 11/26/2013   Procedure: LEFT HIP HEMIARTHROPLASTY;  Surgeon: [name], MD;  Location: [facility];  Service: Orthopedics;  Laterality: Left;   WRIST SURGERY Left 95 & 96    Allergies: Patient has no known allergies.  Medications: Prior to Admission medications   Medication Sig Start Date End Date Taking? Authorizing Provider  aspirin  EC 81 MG tablet Take 1 tablet (81 mg total) by mouth daily. 03/04/20   Vicci Barnie NOVAK, MD  Lacosamide  100 MG TABS Take 1 tablet (100 mg total) by mouth in the morning and  at bedtime. Patient not taking: Reported on 07/28/2024 06/08/24 12/05/24  Onita Duos, MD  Lacosamide  150 MG TABS Take 1 tablet (150 mg total) by mouth 2 (two) times daily. 06/02/24   Cindy Garnette POUR, MD  levETIRAcetam  (KEPPRA ) 750 MG tablet Take 1,500 mg by mouth 2 (two) times daily.    [provider]  loratadine  (CLARITIN ) 10 MG tablet Take 10 mg by mouth daily.    [provider]  memantine  (NAMENDA ) 10 MG tablet Take 1 tablet (10 mg total) by mouth 2 (two) times daily. 02/19/24   Vicci Barnie NOVAK, MD  pantoprazole  (PROTONIX ) 40 MG tablet Take 40 mg by mouth daily.    [provider]  phenytoin  (DILANTIN ) 100 MG ER capsule Take 2 capsules (200 mg total) by mouth at bedtime. FOR SEIZURES 02/19/24   Vicci Barnie NOVAK, MD  PHENYTOIN  INFATABS 50 MG tablet Chew 0.5 tablets (25 mg total) by mouth at bedtime. 02/19/24   Vicci Barnie NOVAK, MD  rosuvastatin  (CRESTOR ) 10 MG tablet Take 10 mg by mouth daily.    [provider]  ursodiol  (ACTIGALL ) 500 MG tablet Take 500 mg by mouth 2 (two) times daily.    [provider]     Family History  Problem Relation Age of Onset   Pulmonary embolism Mother    Liver disease Father    Diabetes Sister    Hypertension Sister    Hypertension Sister    Hypertension Sister  Seizures Neg Hx     Social History   Socioeconomic History   Marital status: Divorced    Spouse name: Not on file   Number of children: 0   Years of education: 10   Highest education level: Not on file  Occupational History   Not on file  Tobacco Use   Smoking status: Former    Current packs/day: 0.00    Types: Cigarettes    Quit date: 01/02/2014    Years since quitting: 10.6   Smokeless tobacco: Former  Building services engineer status: Never Used  Substance and Sexual Activity   Alcohol use: No    Comment: Former Alcoholic, quit at age of 51   Drug use: No   Sexual activity: Yes  Other Topics Concern   Not on file  Social History  Narrative   ** Merged History Encounter **       Patient is single and lives with a friend and her son. Patient does not have any children. Patient is disabled. Patient has a 10 th grade education. Patient is left-handed. Patient drinks some soda but not everyday.   Social Drivers of Corporate investment banker Strain: Low Risk  (06/25/2023)   Overall Financial Resource Strain (CARDIA)    Difficulty of Paying Living Expenses: Not hard at all  Food Insecurity: No Food Insecurity (06/24/2024)   Hunger Vital Sign    Worried About Running Out of Food in the Last Year: Never true    Ran Out of Food in the Last Year: Never true  Transportation Needs: No Transportation Needs (06/24/2024)   PRAPARE - Administrator, Civil Service (Medical): No    Lack of Transportation (Non-Medical): No  Physical Activity: Inactive (01/14/2024)   Exercise Vital Sign    Days of Exercise per Week: 0 days    Minutes of Exercise per Session: 0 min  Stress: No Stress Concern Present (06/25/2023)   [employer] of Occupational Health - Occupational Stress Questionnaire    Feeling of Stress : Not at all  Social Connections: Socially Isolated (06/01/2024)   Social Connection and Isolation Panel    Frequency of Communication with Friends and Family: Never    Frequency of Social Gatherings with Friends and Family: More than three times a week    Attends Religious Services: Never    Database administrator or Organizations: No    Attends Banker Meetings: Never    Marital Status: Divorced    Review of Systems: A 12 point ROS discussed and pertinent positives are indicated in the HPI above.  All other systems are negative.  Review of Systems  Vital Signs: There were no vitals taken for this visit.  Advance Care Plan: The advanced care plan/surrogate decision maker was discussed at the time of visit and documented in the medical record.    Physical Exam  Imaging:  MRI Abdomen  04/08/24    Labs:  CBC: Recent Labs    05/31/24 0549 05/31/24 2137 06/01/24 0535 06/29/24 2151  WBC 6.2 10.7* 8.2 6.7  HGB 13.9 13.6 13.3 13.8  HCT 40.8 39.8 39.4 40.2  PLT 173 168 166 183    COAGS: Recent Labs    05/14/24 1208  INR 1.2*    BMP: Recent Labs    05/31/24 0549 05/31/24 1920 06/01/24 0535 06/29/24 2151  NA 137 135 135 142  K 4.5 4.2 4.4 4.7  CL 103 103 102 105  CO2 25 23  24 24  GLUCOSE 98 110* 104* 113*  BUN 21 15 13 15   CALCIUM  9.0 9.1 9.2 9.6  CREATININE 1.07 1.03 0.96 0.95  GFRNONAA >60 >60 >60 >60    LIVER FUNCTION TESTS: Recent Labs    05/14/24 1208 05/31/24 0549 05/31/24 1920 06/29/24 2151  BILITOT 0.6 0.4 0.7 0.3  AST 13 16 18 18   ALT 15 12 14 10   ALKPHOS 163* 147* 146* 194*  PROT 7.4 7.7 7.2 7.2  ALBUMIN 4.3 3.8 3.7 4.2    TUMOR MARKERS: Recent Labs    05/14/24 1208  AFPTM 1.5  CEA <2.0  CA199 19    Assessment and Plan:  ***  Thank you for this interesting consult.  I greatly enjoyed meeting [name] and look forward to participating in their care.  A copy of this report was sent to the requesting provider on this date.  Electronically Signed: Warren JONELLE Dais, NP 08/10/2024, 1:10 PM   I spent a total of  40 Minutes   in face to face in clinical consultation, greater than 50% of which was counseling/coordinating care for hepatocellular carcinoma

## 2024-08-11 ENCOUNTER — Telehealth: Payer: Self-pay | Admitting: *Deleted

## 2024-08-11 NOTE — Telephone Encounter (Signed)
 CALLED PATIENT'S NEPHEW  ([name]) TO INQUIRE WHEN TO RESCHEDULE HIS UNCLE'S SCAN, LVM FOR A RETURN CALL

## 2024-08-12 ENCOUNTER — Inpatient Hospital Stay: Admission: RE | Admit: 2024-08-12 | Payer: Medicare (Managed Care) | Source: Ambulatory Visit

## 2024-08-12 ENCOUNTER — Other Ambulatory Visit: Payer: Self-pay | Admitting: Internal Medicine

## 2024-08-12 DIAGNOSIS — K219 Gastro-esophageal reflux disease without esophagitis: Secondary | ICD-10-CM

## 2024-08-13 ENCOUNTER — Other Ambulatory Visit: Payer: Medicare (Managed Care)

## 2024-08-13 ENCOUNTER — Other Ambulatory Visit: Payer: Self-pay | Admitting: Interventional Radiology

## 2024-08-13 ENCOUNTER — Other Ambulatory Visit: Payer: Self-pay

## 2024-08-13 DIAGNOSIS — C220 Liver cell carcinoma: Secondary | ICD-10-CM

## 2024-08-17 ENCOUNTER — Telehealth: Payer: Self-pay | Admitting: *Deleted

## 2024-08-17 NOTE — Telephone Encounter (Signed)
 Called patient's nephew - Edelmiro Innocent to inform of MRI for 08-25-24- arrival time - 12:45 pm - @ [facility], patient to be NPO- 4 hrs. prior to scan, lvm for a return call

## 2024-08-17 NOTE — Telephone Encounter (Signed)
 Called patient's nephew to ask question, spoke with patient's nephew

## 2024-08-19 ENCOUNTER — Other Ambulatory Visit: Payer: Self-pay | Admitting: Internal Medicine

## 2024-08-21 ENCOUNTER — Other Ambulatory Visit: Payer: Self-pay

## 2024-08-21 DIAGNOSIS — C220 Liver cell carcinoma: Secondary | ICD-10-CM

## 2024-08-22 ENCOUNTER — Emergency Department (HOSPITAL_COMMUNITY): Payer: Medicare (Managed Care)

## 2024-08-22 ENCOUNTER — Other Ambulatory Visit: Payer: Self-pay

## 2024-08-22 ENCOUNTER — Emergency Department (HOSPITAL_COMMUNITY)
Admission: EM | Admit: 2024-08-22 | Discharge: 2024-08-22 | Disposition: A | Discharge status: Home / Self Care | Payer: Medicare (Managed Care)

## 2024-08-22 ENCOUNTER — Encounter (HOSPITAL_COMMUNITY): Payer: Self-pay

## 2024-08-22 DIAGNOSIS — F0390 Unspecified dementia without behavioral disturbance: Secondary | ICD-10-CM | POA: Insufficient documentation

## 2024-08-22 DIAGNOSIS — R569 Unspecified convulsions: Secondary | ICD-10-CM | POA: Insufficient documentation

## 2024-08-22 DIAGNOSIS — M79652 Pain in left thigh: Secondary | ICD-10-CM | POA: Diagnosis not present

## 2024-08-22 DIAGNOSIS — Z7982 Long term (current) use of aspirin: Secondary | ICD-10-CM | POA: Insufficient documentation

## 2024-08-22 DIAGNOSIS — Z8505 Personal history of malignant neoplasm of liver: Secondary | ICD-10-CM | POA: Insufficient documentation

## 2024-08-22 DIAGNOSIS — Z8673 Personal history of transient ischemic attack (TIA), and cerebral infarction without residual deficits: Secondary | ICD-10-CM | POA: Diagnosis not present

## 2024-08-22 LAB — CBC WITH DIFFERENTIAL/PLATELET
Abs Immature Granulocytes: 0.04 K/uL (ref 0.00–0.07)
Basophils Absolute: 0 K/uL (ref 0.0–0.1)
Basophils Relative: 0 %
Eosinophils Absolute: 0.2 K/uL (ref 0.0–0.5)
Eosinophils Relative: 4 %
HCT: 40.7 % (ref 39.0–52.0)
Hemoglobin: 13.8 g/dL (ref 13.0–17.0)
Immature Granulocytes: 1 %
Lymphocytes Relative: 16 %
Lymphs Abs: 1 K/uL (ref 0.7–4.0)
MCH: 31.7 pg (ref 26.0–34.0)
MCHC: 33.9 g/dL (ref 30.0–36.0)
MCV: 93.6 fL (ref 80.0–100.0)
Monocytes Absolute: 0.7 K/uL (ref 0.1–1.0)
Monocytes Relative: 12 %
Neutro Abs: 4.1 K/uL (ref 1.7–7.7)
Neutrophils Relative %: 67 %
Platelets: 172 K/uL (ref 150–400)
RBC: 4.35 MIL/uL (ref 4.22–5.81)
RDW: 12.1 % (ref 11.5–15.5)
WBC: 6.1 K/uL (ref 4.0–10.5)
nRBC: 0 % (ref 0.0–0.2)

## 2024-08-22 LAB — BASIC METABOLIC PANEL WITH GFR
Anion gap: 9 (ref 5–15)
BUN: 18 mg/dL (ref 8–23)
CO2: 24 mmol/L (ref 22–32)
Calcium: 9.2 mg/dL (ref 8.9–10.3)
Chloride: 104 mmol/L (ref 98–111)
Creatinine, Ser: 0.9 mg/dL (ref 0.61–1.24)
GFR, Estimated: >60 mL/min (ref >60)
Glucose, Bld: 106 mg/dL — ABNORMAL HIGH (ref 70–99)
Potassium: 4.1 mmol/L (ref 3.5–5.1)
Sodium: 136 mmol/L (ref 135–145)

## 2024-08-22 LAB — PHENYTOIN LEVEL, TOTAL: Phenytoin Lvl: 10.2 ug/mL (ref 10.0–20.0)

## 2024-08-22 MED ORDER — LEVETIRACETAM (KEPPRA) 500 MG/5 ML ADULT IV PUSH
1500.0000 mg | Freq: Once | INTRAVENOUS | Status: AC
  Administered 2024-08-22: 1500 mg via INTRAVENOUS
  Filled 2024-08-22: qty 15

## 2024-08-22 NOTE — ED Triage Notes (Signed)
 BIB EMS from home.  Family reports approximately 1 min seizure.  Post ictal x 15 min.  Report seizures about once a month.  Mentation baseline at this time.  Does not recall seizure.  Baseline some confusion.  Not alert to time/but alert to self d/t dementia.

## 2024-08-22 NOTE — ED Provider Notes (Signed)
 [place] EMERGENCY DEPARTMENT AT [facility] Provider Note   CSN: [id number] Arrival date & time: 08/22/24  1506     Patient presents with: Seizures   [name] is a 73 y.o. male.   Patient with history of dementia, seizures currently on Vimpat , Keppra , Depakote, hepatocellular carcinoma, history of primary biliary cirrhosis --presents by EMS after having a seizure.  History from EMS report only at this time as family is not bedside.  They report 1 minute seizure.  They report patient was confused for about 15 minutes.  Review of history: History of stroke with left hemiparesis, dementia, remote history of alcoholism  ED visit: 06/29/24 for seizure, labs unremarkable, given 1500mg  keppra , dc home after treatment.   Additional history from family after their arrival: States that patient was inside, they had gone out to the car and when they returned patient was on the ground shaking.  He was conversant and at baseline prior to this incident.  They do not think that he hit his head, but were not there the entire time.  He has been compliant with medications.  States that sometimes they do not call the ambulance, but symptoms lasted a bit longer today, prompting them to call EMS.       Prior to Admission medications   Medication Sig Start Date End Date Taking? Authorizing Provider  aspirin  EC 81 MG tablet Take 1 tablet (81 mg total) by mouth daily. 03/04/20   Vicci Barnie NOVAK, MD  Lacosamide  100 MG TABS Take 1 tablet (100 mg total) by mouth in the morning and at bedtime. Patient not taking: Reported on 07/28/2024 06/08/24 12/05/24  Onita Duos, MD  Lacosamide  150 MG TABS Take 1 tablet (150 mg total) by mouth 2 (two) times daily. 06/02/24   Cindy Garnette POUR, MD  levETIRAcetam  (KEPPRA ) 750 MG tablet Take 1,500 mg by mouth 2 (two) times daily.    [provider]  loratadine  (CLARITIN ) 10 MG tablet Take 10 mg by mouth daily.    [provider]  memantine   (NAMENDA ) 10 MG tablet TAKE 1 TABLET BY MOUTH TWICE A DAY 08/12/24   Vicci Barnie NOVAK, MD  pantoprazole  (PROTONIX ) 40 MG tablet TAKE 1 TABLET BY MOUTH EVERY DAY 08/12/24   Vicci Barnie NOVAK, MD  phenytoin  (DILANTIN ) 100 MG ER capsule Take 2 capsules (200 mg total) by mouth at bedtime. FOR SEIZURES 02/19/24   Vicci Barnie NOVAK, MD  PHENYTOIN  INFATABS 50 MG tablet CHEW ONE-HALF TABLETS (25 MG TOTAL) BY MOUTH AT BEDTIME. 08/19/24   Vicci Barnie NOVAK, MD  rosuvastatin  (CRESTOR ) 10 MG tablet Take 10 mg by mouth daily.    [provider]  ursodiol  (ACTIGALL ) 500 MG tablet Take 500 mg by mouth 2 (two) times daily.    [provider]    Allergies: Patient has no known allergies.    Review of Systems  Updated Vital Signs BP 108/68   Pulse 67   Temp 97.6 F (36.4 C) (Oral)   Resp 18   Ht 5' 9 (1.753 m)   Wt 77.2 kg   SpO2 95%   BMI 25.13 kg/m   Physical Exam Vitals and nursing note reviewed.  Constitutional:      Appearance: He is well-developed.  HENT:     Head: Normocephalic and atraumatic.  Eyes:     Conjunctiva/sclera: Conjunctivae normal.  Pulmonary:     Effort: No respiratory distress.  Musculoskeletal:     Cervical back: Normal range of motion  and neck supple.     Comments: Complains of anterior left femur pain  Skin:    General: Skin is warm and dry.  Neurological:     Mental Status: He is alert. Mental status is at baseline.     Comments: Patient is able to tell me his name.  Unable to contribute much more to history.  Reports left leg pain.  He is able to move extremities and follow basic commands.  Left sided weakness noted, baseline from previous stroke.     (all labs ordered are listed, but only abnormal results are displayed) Labs Reviewed - No data to display  EKG: None  Radiology: No results found.   Procedures   Medications Ordered in the ED - No data to display  ED Course  Patient seen and examined. History obtained directly  from patient and EMS report.  Reviewed previous ED notes.  Labs/EKG: Ordered CBC, BMP, phenytoin  level.  Imaging: Ordered x-ray left femur  Medications/Fluids: Ordered: Keppra  1500 mg  Most recent vital signs reviewed and are as follows: BP 108/68   Pulse 67   Temp 97.6 F (36.4 C) (Oral)   Resp 18   Ht 5' 9 (1.753 m)   Wt 77.2 kg   SpO2 95%   BMI 25.13 kg/m   Initial impression: Breakthrough seizure, based on history, patient at baseline  5:20 PM Reassessment performed. Patient appears stable.  Family now at bedside.  They questioned that his left face looked twisted when they got in the room, this seems to have resolved now.  Patient at his baseline per family.  They state that patient always has cramps in his left leg, that reported pain not out of the ordinary.  Labs personally reviewed and interpreted including: CBC unremarkable; BMP unremarkable; Dilantin  level is therapeutic.  Imaging personally visualized and interpreted including: Femur x-ray negative for fracture or acute trauma  Reviewed pertinent lab work and imaging with patient at bedside. Questions answered.   Most current vital signs reviewed and are as follows: BP 111/61   Pulse 62   Temp 97.6 F (36.4 C) (Oral)   Resp 17   Ht 5' 9 (1.753 m)   Wt 77.2 kg   SpO2 98%   BMI 25.13 kg/m   Plan: Given question of facial asymmetry, will obtain CT head prior to discharge.  6:35 PM Reassessment performed. Patient appears stable.  No focal symptoms or changes per family.  Patient states that he is ready to go.  Imaging personally visualized and interpreted including: CT head, agree no acute findings, sequelae of previous stroke.  Reviewed pertinent lab work and imaging with patient at bedside. Questions answered.   Most current vital signs reviewed and are as follows: BP 102/78   Pulse 74   Temp 97.6 F (36.4 C) (Oral)   Resp 16   Ht 5' 9 (1.753 m)   Wt 77.2 kg   SpO2 97%   BMI 25.13 kg/m    Plan: Discharge to home.   Prescriptions written for: None  Other home care instructions discussed: Get plenty of rest, continue medications  ED return instructions discussed: New or worsening symptoms, recurrent seizure within 24 hours. Patient/family counseled to return if they have weakness in their arms or legs, slurred speech, trouble walking or talking, confusion, trouble with their balance, or if they have any other concerns. Patient verbalizes understanding and agrees with plan.   Follow-up instructions discussed: Patient encouraged to follow-up with their PCP in 3 days.  Medical Decision Making Amount and/or Complexity of Data Reviewed Labs: ordered. Radiology: ordered.   Patient with breakthrough seizure, history of seizures.  Seizure lasted a bit longer than typical today, prompting family to call EMS.  He has returned to his baseline.  There was question of some facial asymmetry when family arrived, however this has not been persistent.  Head CT without any acute findings.  Do not feel that patient requires MRI at this time.  He is returned to baseline.  He was given Keppra .  Labs are reassuring.  Family will continue to monitor at home.  They are comfortable discharge.  The patient's vital signs, pertinent lab work and imaging were reviewed and interpreted as discussed in the ED course. Hospitalization was considered for further testing, treatments, or serial exams/observation. However as patient is well-appearing, has a stable exam, and reassuring studies today, I do not feel that they warrant admission at this time. This plan was discussed with the patient who verbalizes agreement and comfort with this plan and seems reliable and able to return to the Emergency Department with worsening or changing symptoms.       Final diagnoses:  Seizure [facility])    ED Discharge Orders     None          Desiderio Chew, PA-C 08/22/24 1839     Ula Prentice SAUNDERS, MD 08/22/24 [phone]

## 2024-08-22 NOTE — Discharge Instructions (Signed)
 Please read and follow all provided instructions.  Your diagnoses today include:  1. Seizure (HCC)     Tests performed today include: Complete blood cell count: No concerning findings Basic metabolic panel: No concerning findings Dilantin  level was normal X-ray of the left thigh without any signs of broken bones or other problems CT of the head appears stable without signs of bleeding or other problems, no obvious new stroke noted Vital signs. See below for your results today.   Medications prescribed:  None  Take any prescribed medications only as directed.  Home care instructions:  Follow any educational materials contained in this packet.  BE VERY CAREFUL not to take multiple medicines containing Tylenol  (also called acetaminophen ). Doing so can lead to an overdose which can damage your liver and cause liver failure and possibly death.   Follow-up instructions: Please follow-up with your primary care provider in the next 3 days for further evaluation of your symptoms.   Return instructions:  Please return to the Emergency Department if you experience worsening symptoms.  Please return if you have an additional seizure within 24 hours. Return if you have new weakness in your arms or legs, slurred speech, trouble walking or talking, confusion, or trouble with your balance.  Please return if you have any other emergent concerns.  Additional Information:  Your vital signs today were: BP 102/78   Pulse 74   Temp 97.6 F (36.4 C) (Oral)   Resp 16   Ht 5' 9 (1.753 m)   Wt 77.2 kg   SpO2 97%   BMI 25.13 kg/m  If your blood pressure (BP) was elevated above 135/85 this visit, please have this repeated by your doctor within one month. --------------

## 2024-08-24 ENCOUNTER — Inpatient Hospital Stay: Payer: Medicare (Managed Care) | Admitting: Hematology

## 2024-08-24 ENCOUNTER — Inpatient Hospital Stay: Payer: Medicare (Managed Care) | Attending: Hematology

## 2024-08-24 ENCOUNTER — Telehealth: Payer: Self-pay | Admitting: Hematology

## 2024-08-24 VITALS — BP 110/64 | HR 73 | Temp 97.7°F | Resp 18 | Ht 69.0 in | Wt 172.3 lb

## 2024-08-24 DIAGNOSIS — Z79899 Other long term (current) drug therapy: Secondary | ICD-10-CM | POA: Insufficient documentation

## 2024-08-24 DIAGNOSIS — I69354 Hemiplegia and hemiparesis following cerebral infarction affecting left non-dominant side: Secondary | ICD-10-CM | POA: Diagnosis not present

## 2024-08-24 DIAGNOSIS — R160 Hepatomegaly, not elsewhere classified: Secondary | ICD-10-CM | POA: Insufficient documentation

## 2024-08-24 DIAGNOSIS — F0280 Dementia in other diseases classified elsewhere without behavioral disturbance: Secondary | ICD-10-CM | POA: Insufficient documentation

## 2024-08-24 DIAGNOSIS — G309 Alzheimer's disease, unspecified: Secondary | ICD-10-CM | POA: Diagnosis not present

## 2024-08-24 DIAGNOSIS — F1020 Alcohol dependence, uncomplicated: Secondary | ICD-10-CM | POA: Diagnosis not present

## 2024-08-24 DIAGNOSIS — Z8719 Personal history of other diseases of the digestive system: Secondary | ICD-10-CM | POA: Insufficient documentation

## 2024-08-24 DIAGNOSIS — Z7982 Long term (current) use of aspirin: Secondary | ICD-10-CM | POA: Insufficient documentation

## 2024-08-24 DIAGNOSIS — C220 Liver cell carcinoma: Secondary | ICD-10-CM | POA: Diagnosis present

## 2024-08-24 DIAGNOSIS — Z860101 Personal history of adenomatous and serrated colon polyps: Secondary | ICD-10-CM | POA: Insufficient documentation

## 2024-08-24 LAB — CMP (CANCER CENTER ONLY)
ALT: 11 U/L (ref 0–44)
AST: 14 U/L — ABNORMAL LOW (ref 15–41)
Albumin: 4.2 g/dL (ref 3.5–5.0)
Alkaline Phosphatase: 215 U/L — ABNORMAL HIGH (ref 38–126)
Anion gap: 7 (ref 5–15)
BUN: 16 mg/dL (ref 8–23)
CO2: 23 mmol/L (ref 22–32)
Calcium: 9.2 mg/dL (ref 8.9–10.3)
Chloride: 107 mmol/L (ref 98–111)
Creatinine: 0.96 mg/dL (ref 0.61–1.24)
GFR, Estimated: >60 mL/min (ref >60)
Glucose, Bld: 119 mg/dL — ABNORMAL HIGH (ref 70–99)
Potassium: 4.1 mmol/L (ref 3.5–5.1)
Sodium: 137 mmol/L (ref 135–145)
Total Bilirubin: 0.5 mg/dL (ref 0.0–1.2)
Total Protein: 7.8 g/dL (ref 6.5–8.1)

## 2024-08-24 LAB — CBC WITH DIFFERENTIAL (CANCER CENTER ONLY)
Abs Immature Granulocytes: 0.01 K/uL (ref 0.00–0.07)
Basophils Absolute: 0 K/uL (ref 0.0–0.1)
Basophils Relative: 0 %
Eosinophils Absolute: 0.2 K/uL (ref 0.0–0.5)
Eosinophils Relative: 4 %
HCT: 39.4 % (ref 39.0–52.0)
Hemoglobin: 14.4 g/dL (ref 13.0–17.0)
Immature Granulocytes: 0 %
Lymphocytes Relative: 17 %
Lymphs Abs: 0.9 K/uL (ref 0.7–4.0)
MCH: 32.6 pg (ref 26.0–34.0)
MCHC: 36.5 g/dL — ABNORMAL HIGH (ref 30.0–36.0)
MCV: 89.1 fL (ref 80.0–100.0)
Monocytes Absolute: 0.6 K/uL (ref 0.1–1.0)
Monocytes Relative: 12 %
Neutro Abs: 3.3 K/uL (ref 1.7–7.7)
Neutrophils Relative %: 67 %
Platelet Count: 165 K/uL (ref 150–400)
RBC: 4.42 MIL/uL (ref 4.22–5.81)
RDW: 12 % (ref 11.5–15.5)
WBC Count: 4.9 K/uL (ref 4.0–10.5)
nRBC: 0 % (ref 0.0–0.2)

## 2024-08-24 NOTE — Telephone Encounter (Signed)
 Called and LVM regarding appt time and date. Mailed letter.

## 2024-08-24 NOTE — Progress Notes (Signed)
 [facility]   [phone] [phone]   Clinic Follow up Note   Patient Care Team: Vicci Barnie NOVAK, MD as PCP - General (Internal Medicine) Vicci Barnie NOVAK, MD (Internal Medicine) Ardis Evalene CROME, RN as Oncology Nurse Navigator  Date of Service:  08/24/2024  CHIEF COMPLAINT: f/u of [facility]  CURRENT THERAPY:  Pending SBRT versus Y90 Assessment & Plan Hepatocellular carcinoma -Diagnosed in June 2025, MRI showed 3.2 cm mass in right hepatic lobe with segment 4A , consistent with HCC.  Patient is asymptomatic.   -Patient has been seen by radiation oncologist Dr. Dewey, he is a good candidate for SBRT treatment. -Undergoing evaluation for treatment options. Missed previous interventional radiology appointment. MRI scheduled for tomorrow to assess current status. Two treatment options: non-invasive radiology requiring cooperation without sedation, and invasive procedure with sedation. Family to decide based on tolerance and manageability. - MRI scheduled for tomorrow at 1 PM. - Appointment with interventional radiology on November 11th. - Family to decide on treatment option based on tolerance and manageability. - Follow-up in three months to ensure treatment is initiated.  Plan - He is scheduled for repeated liver MRI tomorrow - I reminded his appointment with IR next week on November 11, I printed his appointment and wrote down the address for him - Family will decide if he proceeded with SBRT radiation or liver directed therapy with IR next week - Follow-up in 3 months with lab   SUMMARY OF ONCOLOGIC HISTORY: Oncology History   No history exists.     Discussed the use of AI scribe software for clinical note transcription with the patient, who gave verbal consent to proceed.  History of Present Illness [name] is a 73 year old male with hepatocellular carcinoma who presents for follow-up. He is accompanied by his sister.  He has an  MRI scheduled for tomorrow and an appointment with interventional radiology on November 11th. He missed a previous appointment possibly due to hospitalization of both him and his son. He has Alzheimer's disease, which affects his ability to understand medical discussions, necessitating the presence of a family member during appointments. He reports no pain, stomach discomfort, or swelling of the legs. He is eating well, consuming three meals a day, and feels 'like normal'.     All other systems were reviewed with the patient and are negative.  MEDICAL HISTORY:  Past Medical History:  Diagnosis Date   Alcoholism (HCC)    History of, not active   Chronic hepatitis C (HCC)    Dementia (HCC)    Diverticulosis    Gait disorder    Gingival hypertrophy    Secondary to Dilantin    Hemiparesis and alteration of sensations as late effects of stroke (HCC) 01/25/2017   Left hemiparesis   Hiatal hernia    History of alcoholism (HCC)    Hx of ischemic vertebrobasilar artery thalamic stroke    Right   Internal hemorrhoids    Memory disorder 03/04/2013   Primary biliary cirrhosis (HCC)    Seizures (HCC)    Stroke (HCC)    Right frontal, right thalamic   Tubular adenoma of colon    Ulnar neuropathy of left upper extremity     SURGICAL HISTORY: Past Surgical History:  Procedure Laterality Date   COLONOSCOPY WITH PROPOFOL  N/A 07/10/2017   Procedure: COLONOSCOPY WITH PROPOFOL ;  Surgeon: Elicia Claw, MD;  Location: [facility] ENDOSCOPY;  Service: Gastroenterology;  Laterality: N/A;   ESOPHAGOGASTRODUODENOSCOPY (EGD) WITH PROPOFOL  N/A 07/10/2017  Procedure: ESOPHAGOGASTRODUODENOSCOPY (EGD) WITH PROPOFOL ;  Surgeon: Elicia Claw, MD;  Location: [facility] ENDOSCOPY;  Service: Gastroenterology;  Laterality: N/A;   HIP ARTHROPLASTY Left 11/26/2013   Procedure: LEFT HIP HEMIARTHROPLASTY;  Surgeon: [name], MD;  Location: [facility] OR;  Service: Orthopedics;  Laterality: Left;   WRIST SURGERY Left 95 &  96    I have reviewed the social history and family history with the patient and they are unchanged from previous note.  ALLERGIES:  has no known allergies.  MEDICATIONS:  Current Outpatient Medications  Medication Sig Dispense Refill   aspirin  EC 81 MG tablet Take 1 tablet (81 mg total) by mouth daily. 100 tablet 0   Lacosamide  100 MG TABS Take 1 tablet (100 mg total) by mouth in the morning and at bedtime. 180 tablet 3   Lacosamide  150 MG TABS Take 1 tablet (150 mg total) by mouth 2 (two) times daily. 60 tablet 0   levETIRAcetam  (KEPPRA ) 750 MG tablet Take 1,500 mg by mouth 2 (two) times daily.     loratadine  (CLARITIN ) 10 MG tablet Take 10 mg by mouth daily.     memantine  (NAMENDA ) 10 MG tablet TAKE 1 TABLET BY MOUTH TWICE A DAY 60 tablet 0   pantoprazole  (PROTONIX ) 40 MG tablet TAKE 1 TABLET BY MOUTH EVERY DAY 30 tablet 0   phenytoin  (DILANTIN ) 100 MG ER capsule Take 2 capsules (200 mg total) by mouth at bedtime. FOR SEIZURES 180 capsule 2   PHENYTOIN  INFATABS 50 MG tablet CHEW ONE-HALF TABLETS (25 MG TOTAL) BY MOUTH AT BEDTIME. 45 tablet 1   rosuvastatin  (CRESTOR ) 10 MG tablet Take 10 mg by mouth daily.     ursodiol  (ACTIGALL ) 500 MG tablet Take 500 mg by mouth 2 (two) times daily.     No current facility-administered medications for this visit.    PHYSICAL EXAMINATION: ECOG PERFORMANCE STATUS: 2 - Symptomatic, <50% confined to bed  Vitals:   08/24/24 1100  BP: 110/64  Pulse: 73  Resp: 18  Temp: 97.7 F (36.5 C)  SpO2: 97%   Wt Readings from Last 3 Encounters:  08/24/24 172 lb 4.8 oz (78.2 kg)  08/22/24 170 lb 3.1 oz (77.2 kg)  07/28/24 170 lb 3.2 oz (77.2 kg)     GENERAL:alert, no distress and comfortable SKIN: skin color, texture, turgor are normal, no rashes or significant lesions EYES: normal, Conjunctiva are pink and non-injected, sclera clear NECK: supple, thyroid  normal size, non-tender, without nodularity LYMPH:  no palpable lymphadenopathy in the cervical,  axillary  LUNGS: clear to auscultation and percussion with normal breathing effort HEART: regular rate & rhythm and no murmurs and no lower extremity edema ABDOMEN:abdomen soft, non-tender and normal bowel sounds Musculoskeletal:no cyanosis of digits and no clubbing  NEURO: alert & oriented x 3 with fluent speech, no focal motor/sensory deficits  Physical Exam    LABORATORY DATA:  I have reviewed the data as listed    Latest Ref Rng & Units 08/24/2024   10:17 AM 08/22/2024    3:43 PM 06/29/2024    9:51 PM  CBC  WBC 4.0 - 10.5 K/uL 4.9  6.1  6.7   Hemoglobin 13.0 - 17.0 g/dL 85.5  86.1  86.1   Hematocrit 39.0 - 52.0 % 39.4  40.7  40.2   Platelets 150 - 400 K/uL 165  172  183         Latest Ref Rng & Units 08/24/2024   10:17 AM 08/22/2024    3:43 PM 06/29/2024  9:51 PM  CMP  Glucose 70 - 99 mg/dL 880  893  886   BUN 8 - 23 mg/dL 16  18  15    Creatinine 0.61 - 1.24 mg/dL 9.03  9.09  9.04   Sodium 135 - 145 mmol/L 137  136  142   Potassium 3.5 - 5.1 mmol/L 4.1  4.1  4.7   Chloride 98 - 111 mmol/L 107  104  105   CO2 22 - 32 mmol/L 23  24  24    Calcium  8.9 - 10.3 mg/dL 9.2  9.2  9.6   Total Protein 6.5 - 8.1 g/dL 7.8   7.2   Total Bilirubin 0.0 - 1.2 mg/dL 0.5   0.3   Alkaline Phos 38 - 126 U/L 215   194   AST 15 - 41 U/L 14   18   ALT 0 - 44 U/L 11   10       RADIOGRAPHIC STUDIES: I have personally reviewed the radiological images as listed and agreed with the findings in the report. CT Head Wo Contrast Result Date: 08/22/2024 EXAM: CT HEAD WITHOUT CONTRAST 08/22/2024 05:55:56 PM TECHNIQUE: CT of the head was performed without the administration of intravenous contrast. Automated exposure control, iterative reconstruction, and/or weight based adjustment of the mA/kV was utilized to reduce the radiation dose to as low as reasonably achievable. COMPARISON: 05/31/2024 CLINICAL HISTORY: seizure today, question of facial asymmetry FINDINGS: BRAIN AND VENTRICLES: No acute  hemorrhage. No evidence of acute infarct. Scattered periventricular white matter hypoattenuation, consistent with mild chronic ischemic microvascular disease. Right parieto-occipital encephalomalacia. Volume loss with ex vacuo dilatation of right lateral ventricle involving posterior horn and atrium. No hydrocephalus. No extra-axial collection. No mass effect or midline shift. Calcified atherosclerotic plaque within cavernous/supraclinoid internal carotid arteries. ORBITS: No acute abnormality. SINUSES: No acute abnormality. SOFT TISSUES AND SKULL: No acute soft tissue abnormality. No skull fracture. Left mastoid and middle ear effusion. IMPRESSION: 1. No acute intracranial abnormality. 2. Right parieto-occipital encephalomalacia with associated volume loss and ex vacuo dilatation of the right lateral ventricle involving the posterior horn and atrium. 3. Left mastoid and middle ear effusion. Electronically signed by: Franky Stanford MD 08/22/2024 06:08 PM EDT RP Workstation: [id number]   DG Femur Min 2 Views Left Result Date: 08/22/2024 CLINICAL DATA:  Pain to left femur following seizure/fall. EXAM: LEFT FEMUR 2 VIEWS COMPARISON:  09/12/2023. FINDINGS: Total hip arthroplasty changes are noted on the left without evidence of hardware loosening. No acute fracture or dislocation is seen. Mild degenerative changes are noted at the knee. There is no joint effusion at the knee. Vascular calcifications are present in the soft tissues. IMPRESSION: Status post total hip arthroplasty without evidence of acute fracture or hardware loosening. Electronically Signed   By: Leita Birmingham M.D.   On: 08/22/2024 16:36      No orders of the defined types were placed in this encounter.  All questions were answered. The patient knows to call the clinic with any problems, questions or concerns. No barriers to learning was detected. The total time spent in the appointment was 20 minutes, including review of chart and various tests  results, discussions about plan of care and coordination of care plan     Onita Mattock, MD 08/24/2024

## 2024-08-25 ENCOUNTER — Ambulatory Visit (HOSPITAL_COMMUNITY)
Admission: RE | Admit: 2024-08-25 | Discharge: 2024-08-25 | Disposition: A | Discharge status: Home / Self Care | Payer: Medicare (Managed Care) | Source: Ambulatory Visit | Attending: Radiation Oncology | Admitting: Radiation Oncology

## 2024-08-25 DIAGNOSIS — C220 Liver cell carcinoma: Secondary | ICD-10-CM | POA: Diagnosis present

## 2024-08-25 MED ORDER — GADOBUTROL 1 MMOL/ML IV SOLN
8.0000 mL | Freq: Once | INTRAVENOUS | Status: AC | PRN
  Administered 2024-08-25: 8 mL

## 2024-08-26 ENCOUNTER — Telehealth: Payer: Self-pay | Admitting: Radiation Oncology

## 2024-08-26 NOTE — Telephone Encounter (Signed)
 I called the patient's nephew [name] who is his surrogate decision maker and asked him to call back to discuss the patients MRI. He has had growth of the lesion in the liver and will see IR next week to see if they would recommend any ablative interventions. If he is not a candidate, Dr. Dewey recommends SBRT and we would need fiducial marker placement and appreciate IR's input.   Unfortunately the pt's nephew was unavailable but I left a message asking him to call me back to discuss the MRI.

## 2024-09-01 ENCOUNTER — Other Ambulatory Visit: Payer: Self-pay | Admitting: Interventional Radiology

## 2024-09-01 ENCOUNTER — Ambulatory Visit
Admission: RE | Admit: 2024-09-01 | Discharge: 2024-09-01 | Disposition: A | Discharge status: Home / Self Care | Payer: Medicare (Managed Care) | Source: Ambulatory Visit | Attending: Internal Medicine | Admitting: Internal Medicine

## 2024-09-01 DIAGNOSIS — R160 Hepatomegaly, not elsewhere classified: Secondary | ICD-10-CM

## 2024-09-01 DIAGNOSIS — C220 Liver cell carcinoma: Secondary | ICD-10-CM

## 2024-09-01 HISTORY — PX: IR RADIOLOGIST EVAL & MGMT: IMG5224

## 2024-09-01 NOTE — Consult Note (Signed)
 Chief Complaint: Patient was seen in consultation today for Hepatocellular Carcinoma at the request of Pyrtle,Jay M  Referring Physician(s): Pyrtle,Jay M  Consulting Physician: Dr. Wilkie Lent   History of Present Illness: [name] is a 73 y.o. male with a history of dementia, seizure disorder, hepatitis C, and primary biliary cirrhosis with previous imaging concerning for Hepatocellular Carcinoma. MR Abdomen in June 2025 revealed suspicious liver lesion consistent with hepatocellular carcinoma. Patient was subsequently referred by GI for liver transplant evaluation which he was evaluated for at [facility] by Dr. MARLA Barton on 9/4. Unfortunately, he is not considered a candidate due to his co-morbidities and current physical deconditioning. Recommendation was made to explore less invasive options including Radiation Oncology and Interventional Radiology treatments. Evaluation with Radiation Oncology on 10/7 with Dr. Dewey revealed that patient would be a good candidate for stereotactic body radiotherapy, should he not be a candidate for IR intervention; however, official treatment plan pending IR evaluation. Follow up MR Liver completed on 11/4 at the recommendation of Dr. Dewey which revealed enhancing liver lesion in segment VIII, increased in size from previous imaging; features typical of hepatocellular carcinoma. No new hepatic lesions. Patient presents today for his evaluation with IR to discuss possible treatment options including chemoembolization and microwave ablation.   [name] presents today accompanied by his nephew who is his medical decision-maker.  He is currently in his normal state of health and has no active complaints.  He and his family are aware of his enlarging liver lesion and its diagnosis as hepatocellular carcinoma.   Past Medical History:  Diagnosis Date   Alcoholism (HCC)    History of, not active   Chronic hepatitis C (HCC)    Dementia (HCC)     Diverticulosis    Gait disorder    Gingival hypertrophy    Secondary to Dilantin    Hemiparesis and alteration of sensations as late effects of stroke (HCC) 01/25/2017   Left hemiparesis   Hiatal hernia    History of alcoholism (HCC)    Hx of ischemic vertebrobasilar artery thalamic stroke    Right   Internal hemorrhoids    Memory disorder 03/04/2013   Primary biliary cirrhosis (HCC)    Seizures (HCC)    Stroke (HCC)    Right frontal, right thalamic   Tubular adenoma of colon    Ulnar neuropathy of left upper extremity     Past Surgical History:  Procedure Laterality Date   COLONOSCOPY WITH PROPOFOL  N/A 07/10/2017   Procedure: COLONOSCOPY WITH PROPOFOL ;  Surgeon: Elicia Claw, MD;  Location: [facility];  Service: Gastroenterology;  Laterality: N/A;   ESOPHAGOGASTRODUODENOSCOPY (EGD) WITH PROPOFOL  N/A 07/10/2017   Procedure: ESOPHAGOGASTRODUODENOSCOPY (EGD) WITH PROPOFOL ;  Surgeon: Elicia Claw, MD;  Location: [facility];  Service: Gastroenterology;  Laterality: N/A;   HIP ARTHROPLASTY Left 11/26/2013   Procedure: LEFT HIP HEMIARTHROPLASTY;  Surgeon: [name], MD;  Location: [facility] OR;  Service: Orthopedics;  Laterality: Left;   WRIST SURGERY Left 95 & 96    Allergies: Patient has no known allergies.  Medications: Prior to Admission medications   Medication Sig Start Date End Date Taking? Authorizing Provider  aspirin  EC 81 MG tablet Take 1 tablet (81 mg total) by mouth daily. 03/04/20   Vicci Barnie NOVAK, MD  Lacosamide  100 MG TABS Take 1 tablet (100 mg total) by mouth in the morning and at bedtime. 06/08/24 12/05/24  Onita Duos, MD  Lacosamide  150 MG TABS Take 1 tablet (150 mg total)  by mouth 2 (two) times daily. 06/02/24   Cindy Garnette POUR, MD  levETIRAcetam  (KEPPRA ) 750 MG tablet Take 1,500 mg by mouth 2 (two) times daily.    [provider]  loratadine  (CLARITIN ) 10 MG tablet Take 10 mg by mouth daily.    [provider]  memantine   (NAMENDA ) 10 MG tablet TAKE 1 TABLET BY MOUTH TWICE A DAY 08/12/24   Vicci Barnie NOVAK, MD  pantoprazole  (PROTONIX ) 40 MG tablet TAKE 1 TABLET BY MOUTH EVERY DAY 08/12/24   Vicci Barnie NOVAK, MD  phenytoin  (DILANTIN ) 100 MG ER capsule Take 2 capsules (200 mg total) by mouth at bedtime. FOR SEIZURES 02/19/24   Vicci Barnie NOVAK, MD  PHENYTOIN  INFATABS 50 MG tablet CHEW ONE-HALF TABLETS (25 MG TOTAL) BY MOUTH AT BEDTIME. 08/19/24   Vicci Barnie NOVAK, MD  rosuvastatin  (CRESTOR ) 10 MG tablet Take 10 mg by mouth daily.    [provider]  ursodiol  (ACTIGALL ) 500 MG tablet Take 500 mg by mouth 2 (two) times daily.    [provider]     Family History  Problem Relation Age of Onset   Pulmonary embolism Mother    Liver disease Father    Diabetes Sister    Hypertension Sister    Hypertension Sister    Hypertension Sister    Seizures Neg Hx     Social History   Socioeconomic History   Marital status: Divorced    Spouse name: Not on file   Number of children: 0   Years of education: 10   Highest education level: Not on file  Occupational History   Not on file  Tobacco Use   Smoking status: Former    Current packs/day: 0.00    Types: Cigarettes    Quit date: 01/02/2014    Years since quitting: 10.6   Smokeless tobacco: Former  Building Services Engineer status: Never Used  Substance and Sexual Activity   Alcohol use: No    Comment: Former Alcoholic, quit at age of 89   Drug use: No   Sexual activity: Yes  Other Topics Concern   Not on file  Social History Narrative   ** Merged History Encounter **       Patient is single and lives with a friend and her son. Patient does not have any children. Patient is disabled. Patient has a 10 th grade education. Patient is left-handed. Patient drinks some soda but not everyday.   Social Drivers of Corporate Investment Banker Strain: Low Risk  (06/25/2023)   Overall Financial Resource Strain (CARDIA)    Difficulty of  Paying Living Expenses: Not hard at all  Food Insecurity: No Food Insecurity (06/24/2024)   Hunger Vital Sign    Worried About Running Out of Food in the Last Year: Never true    Ran Out of Food in the Last Year: Never true  Transportation Needs: No Transportation Needs (06/24/2024)   PRAPARE - Administrator, Civil Service (Medical): No    Lack of Transportation (Non-Medical): No  Physical Activity: Inactive (01/14/2024)   Exercise Vital Sign    Days of Exercise per Week: 0 days    Minutes of Exercise per Session: 0 min  Stress: No Stress Concern Present (06/25/2023)   [employer] of Occupational Health - Occupational Stress Questionnaire    Feeling of Stress : Not at all  Social Connections: Socially Isolated (06/01/2024)   Social Connection and Isolation Panel    Frequency  of Communication with Friends and Family: Never    Frequency of Social Gatherings with Friends and Family: More than three times a week    Attends Religious Services: Never    Database Administrator or Organizations: No    Attends Banker Meetings: Never    Marital Status: Divorced    Review of Systems: A 12 point ROS discussed and pertinent positives are indicated in the HPI above.  All other systems are negative.  Review of Systems  Vital Signs: There were no vitals taken for this visit.   Physical Exam Constitutional:      General: He is not in acute distress.    Appearance: Normal appearance. He is normal weight.  HENT:     Head: Normocephalic and atraumatic.  Eyes:     General: No scleral icterus. Cardiovascular:     Rate and Rhythm: Normal rate.  Pulmonary:     Effort: Pulmonary effort is normal.  Abdominal:     General: There is no distension.     Palpations: Abdomen is soft.     Tenderness: There is no abdominal tenderness.  Skin:    General: Skin is warm and dry.  Neurological:     Mental Status: He is alert. Mental status is at baseline.        Imaging: MR LIVER W WO CONTRAST Result Date: 08/25/2024 EXAM: MRI Abdomen with and without Contrast 08/25/2024 01:29:37 PM TECHNIQUE: Multiplanar multisequence MRI of the abdomen was performed with and without the administration of intravenous contrast. 8 mL of gadobutrol  (GADAVIST ) 1 MMOL/ML injection was administered intravenously. COMPARISON: MRI 04/08/2004 CLINICAL HISTORY: Hepatocellular carcinoma, staging FINDINGS: LIVER: Enhancing lesion in the central right hepatic lobe (segment VIII) measures 37 x 37 mm compared to 30 x 27 mm on comparison exam remeasured. The lesion demonstrates washout and a peripheral rim pseudocapsule typical of hepatocellular carcinoma. There are no new enhancing lesions within the liver parenchyma. Small cysts within the left and right hepatic lobes are unchanged. GALLBLADDER AND BILIARY SYSTEM: Gallbladder is unremarkable. Biliary tree is normal. SPLEEN: Unremarkable. PANCREAS: The pancreas is normal. ADRENAL GLANDS: Unremarkable. KIDNEYS: Unremarkable. LYMPH NODES: Lymphadenopathy in the porta hepatis. VASCULATURE: Portal veins are patent and uninvolved by the tumor. PERITONEUM: No ascites. BOWEL: Grossly unremarkable. ABDOMINAL WALL: No acute abnormality. BONES: No acute abnormality. IMPRESSION: 1. Enhancing lesion in segment VIII of the liver measures 37 mm, increased from 30 mm on prior, with imaging features typical of hepatocellular carcinoma. 2. No new hepatic lesions. 3. No Porta hepatis lymphadenopathy. 4. No ascites. Pulmonary veins patent Electronically signed by: Norleen Boxer MD 08/25/2024 01:56 PM EST RP Workstation: [id number]   CT Head Wo Contrast Result Date: 08/22/2024 EXAM: CT HEAD WITHOUT CONTRAST 08/22/2024 05:55:56 PM TECHNIQUE: CT of the head was performed without the administration of intravenous contrast. Automated exposure control, iterative reconstruction, and/or weight based adjustment of the mA/kV was utilized to reduce the radiation dose  to as low as reasonably achievable. COMPARISON: 05/31/2024 CLINICAL HISTORY: seizure today, question of facial asymmetry FINDINGS: BRAIN AND VENTRICLES: No acute hemorrhage. No evidence of acute infarct. Scattered periventricular white matter hypoattenuation, consistent with mild chronic ischemic microvascular disease. Right parieto-occipital encephalomalacia. Volume loss with ex vacuo dilatation of right lateral ventricle involving posterior horn and atrium. No hydrocephalus. No extra-axial collection. No mass effect or midline shift. Calcified atherosclerotic plaque within cavernous/supraclinoid internal carotid arteries. ORBITS: No acute abnormality. SINUSES: No acute abnormality. SOFT TISSUES AND SKULL: No acute soft tissue abnormality. No skull  fracture. Left mastoid and middle ear effusion. IMPRESSION: 1. No acute intracranial abnormality. 2. Right parieto-occipital encephalomalacia with associated volume loss and ex vacuo dilatation of the right lateral ventricle involving the posterior horn and atrium. 3. Left mastoid and middle ear effusion. Electronically signed by: Franky Stanford MD 08/22/2024 06:08 PM EDT RP Workstation: [id number]   DG Femur Min 2 Views Left Result Date: 08/22/2024 CLINICAL DATA:  Pain to left femur following seizure/fall. EXAM: LEFT FEMUR 2 VIEWS COMPARISON:  09/12/2023. FINDINGS: Total hip arthroplasty changes are noted on the left without evidence of hardware loosening. No acute fracture or dislocation is seen. Mild degenerative changes are noted at the knee. There is no joint effusion at the knee. Vascular calcifications are present in the soft tissues. IMPRESSION: Status post total hip arthroplasty without evidence of acute fracture or hardware loosening. Electronically Signed   By: Leita Birmingham M.D.   On: 08/22/2024 16:36    Labs:  CBC: Recent Labs    06/01/24 0535 06/29/24 2151 08/22/24 1543 08/24/24 1017  WBC 8.2 6.7 6.1 4.9  HGB 13.3 13.8 13.8 14.4  HCT 39.4  40.2 40.7 39.4  PLT 166 183 172 165    COAGS: Recent Labs    05/14/24 1208  INR 1.2*    BMP: Recent Labs    06/01/24 0535 06/29/24 2151 08/22/24 1543 08/24/24 1017  NA 135 142 136 137  K 4.4 4.7 4.1 4.1  CL 102 105 104 107  CO2 24 24 24 23   GLUCOSE 104* 113* 106* 119*  BUN 13 15 18 16   CALCIUM  9.2 9.6 9.2 9.2  CREATININE 0.96 0.95 0.90 0.96  GFRNONAA >60 >60 >60 >60    LIVER FUNCTION TESTS: Recent Labs    05/31/24 0549 05/31/24 1920 06/29/24 2151 08/24/24 1017  BILITOT 0.4 0.7 0.3 0.5  AST 16 18 18  14*  ALT 12 14 10 11   ALKPHOS 147* 146* 194* 215*  PROT 7.7 7.2 7.2 7.8  ALBUMIN 3.8 3.7 4.2 4.2    TUMOR MARKERS: Recent Labs    05/14/24 1208  AFPTM 1.5  CEA <2.0  CA199 19    Assessment and Plan:  73 year old male with a history of dementia, seizure disorder, hepatitis C, primary biliary cirrhosis, and a recent diagnosis of hepatocellular carcinoma.  Given current physical deconditioning and co-morbidities, patient is not considered a candidate for liver transplant.   Based on imaging today, recommend patient start with initial treatment of chemoembolization with the goal of decreased tumor vascularity and decreased volume followed by microwave ablation after approximately 1 month to enhance treatment effectiveness and reduce the risk of tumor recurrence. Thorough discussion of treatment plan, including risks and benefits, were had with the family today who are agreeable to move forward with IR treatment at this time.    1.) Please schedule for transarterial chemoembolization with drug-eluting beads at [facility] as soon as possible.  Patient will then need a follow-up appointment for percutaneous thermal ablation with CT guidance also at [facility]  The chemoembolization procedure can be performed with moderate sedation while the microwave ablation procedure will require general anesthesia.  Thank you for this interesting  consult.  I greatly enjoyed meeting YOUSEF HUGE and look forward to participating in their care.  A copy of this report was sent to the requesting provider on this date.  Electronically Signed: Caitlyn M McInnis 09/01/2024, 10:50 AM   I spent a total of 60 Minutes in face to face in clinical consultation,  greater than 50% of which was counseling/coordinating care for hepatocellular carcinoma.

## 2024-09-02 ENCOUNTER — Encounter: Payer: Self-pay | Admitting: Interventional Radiology

## 2024-09-09 ENCOUNTER — Other Ambulatory Visit (HOSPITAL_COMMUNITY): Payer: Self-pay | Admitting: Nurse Practitioner

## 2024-09-09 DIAGNOSIS — M7989 Other specified soft tissue disorders: Secondary | ICD-10-CM

## 2024-09-10 ENCOUNTER — Emergency Department (HOSPITAL_COMMUNITY)
Admission: EM | Admit: 2024-09-10 | Discharge: 2024-09-10 | Disposition: A | Discharge status: Home / Self Care | Payer: Medicare (Managed Care) | Attending: Emergency Medicine | Admitting: Emergency Medicine

## 2024-09-10 ENCOUNTER — Telehealth: Payer: Self-pay | Admitting: Neurology

## 2024-09-10 ENCOUNTER — Other Ambulatory Visit (HOSPITAL_COMMUNITY): Payer: Self-pay | Admitting: Nurse Practitioner

## 2024-09-10 ENCOUNTER — Encounter: Payer: Self-pay | Admitting: Neurology

## 2024-09-10 ENCOUNTER — Institutional Professional Consult (permissible substitution): Payer: Medicare HMO | Admitting: Neurology

## 2024-09-10 ENCOUNTER — Encounter (HOSPITAL_COMMUNITY): Payer: Self-pay

## 2024-09-10 ENCOUNTER — Emergency Department (HOSPITAL_COMMUNITY): Payer: Medicare (Managed Care)

## 2024-09-10 ENCOUNTER — Other Ambulatory Visit: Payer: Self-pay

## 2024-09-10 DIAGNOSIS — S62613A Displaced fracture of proximal phalanx of left middle finger, initial encounter for closed fracture: Secondary | ICD-10-CM | POA: Diagnosis not present

## 2024-09-10 DIAGNOSIS — M7989 Other specified soft tissue disorders: Secondary | ICD-10-CM | POA: Insufficient documentation

## 2024-09-10 DIAGNOSIS — S6992XA Unspecified injury of left wrist, hand and finger(s), initial encounter: Secondary | ICD-10-CM | POA: Diagnosis present

## 2024-09-10 DIAGNOSIS — X58XXXA Exposure to other specified factors, initial encounter: Secondary | ICD-10-CM | POA: Diagnosis not present

## 2024-09-10 DIAGNOSIS — S62615A Displaced fracture of proximal phalanx of left ring finger, initial encounter for closed fracture: Secondary | ICD-10-CM | POA: Insufficient documentation

## 2024-09-10 DIAGNOSIS — F0390 Unspecified dementia without behavioral disturbance: Secondary | ICD-10-CM | POA: Diagnosis not present

## 2024-09-10 DIAGNOSIS — Z7982 Long term (current) use of aspirin: Secondary | ICD-10-CM | POA: Insufficient documentation

## 2024-09-10 DIAGNOSIS — R600 Localized edema: Secondary | ICD-10-CM

## 2024-09-10 MED ORDER — OXYCODONE-ACETAMINOPHEN 5-325 MG PO TABS
2.0000 | ORAL_TABLET | Freq: Once | ORAL | Status: AC
  Administered 2024-09-10: 2 via ORAL
  Filled 2024-09-10: qty 2

## 2024-09-10 NOTE — Discharge Instructions (Addendum)
 As we discussed, you need to follow-up with Dr. Arlyn next week.  Please call and schedule an appointment.  You may return to the emergency department for any worsening symptoms.  You can take 600 mg of ibuprofen every 6 hours as needed for pain.

## 2024-09-10 NOTE — ED Triage Notes (Signed)
 Pt reports swelling and pain to left hand that his nephew noticed yesterday. + radial pulse., Unknown injury.

## 2024-09-10 NOTE — ED Provider Notes (Signed)
 [place] EMERGENCY DEPARTMENT AT [facility] Provider Note   CSN: [id number] Arrival date & time: 09/10/24  0147     Patient presents with: Hand Pain   [name] is a 73 y.o. male.  Patient with past medical history significant for stroke, seizures, memory disorder, dementia presents to the emergency department accompanied by his nephew.  Nephew reports noticing swelling and pain to the patient's left hand.  This was noticed yesterday.  Unknown injury.  Patient is unable to contribute to history due to underlying dementia.    Hand Pain       Prior to Admission medications   Medication Sig Start Date End Date Taking? Authorizing Provider  aspirin  EC 81 MG tablet Take 1 tablet (81 mg total) by mouth daily. 03/04/20   Vicci Barnie NOVAK, MD  Lacosamide  100 MG TABS Take 1 tablet (100 mg total) by mouth in the morning and at bedtime. 06/08/24 12/05/24  Onita Duos, MD  Lacosamide  150 MG TABS Take 1 tablet (150 mg total) by mouth 2 (two) times daily. 06/02/24   Cindy Garnette POUR, MD  levETIRAcetam  (KEPPRA ) 750 MG tablet Take 1,500 mg by mouth 2 (two) times daily.    [provider]  loratadine  (CLARITIN ) 10 MG tablet Take 10 mg by mouth daily.    [provider]  memantine  (NAMENDA ) 10 MG tablet TAKE 1 TABLET BY MOUTH TWICE A DAY 08/12/24   Vicci Barnie NOVAK, MD  pantoprazole  (PROTONIX ) 40 MG tablet TAKE 1 TABLET BY MOUTH EVERY DAY 08/12/24   Vicci Barnie NOVAK, MD  phenytoin  (DILANTIN ) 100 MG ER capsule Take 2 capsules (200 mg total) by mouth at bedtime. FOR SEIZURES 02/19/24   Vicci Barnie NOVAK, MD  PHENYTOIN  INFATABS 50 MG tablet CHEW ONE-HALF TABLETS (25 MG TOTAL) BY MOUTH AT BEDTIME. 08/19/24   Vicci Barnie NOVAK, MD  rosuvastatin  (CRESTOR ) 10 MG tablet Take 10 mg by mouth daily.    [provider]  ursodiol  (ACTIGALL ) 500 MG tablet Take 500 mg by mouth 2 (two) times daily.    [provider]    Allergies: Patient has no known  allergies.    Review of Systems  Updated Vital Signs BP 131/76 (BP Location: Right Arm)   Pulse 73   Temp 98.2 F (36.8 C) (Oral)   Resp 18   Ht 5' 9 (1.753 m)   Wt 78 kg   SpO2 97%   BMI 25.40 kg/m   Physical Exam Vitals and nursing note reviewed.  HENT:     Head: Normocephalic and atraumatic.  Eyes:     Pupils: Pupils are equal, round, and reactive to light.  Pulmonary:     Effort: Pulmonary effort is normal. No respiratory distress.  Musculoskeletal:        General: Swelling, tenderness, deformity and signs of injury present.     Cervical back: Normal range of motion.     Comments: Minimal deformity appreciated at the base of the 3rd and 4th digits of the left hand.  Patient grimaces in pain with any movement/manipulation of the same digits.  Brisk cap refill.  Radial pulse intact.  Skin:    General: Skin is dry.  Neurological:     Mental Status: He is alert. Mental status is at baseline.  Psychiatric:        Speech: Speech normal.        Behavior: Behavior normal.     (all labs ordered are listed, but only abnormal results are  displayed) Labs Reviewed - No data to display  EKG: None  Radiology: DG Hand Complete Left Result Date: 09/10/2024 EXAM: 3 or more VIEW(S) XRAY OF THE LEFT HAND 09/10/2024 02:19:49 AM COMPARISON: None available. CLINICAL HISTORY: swelling FINDINGS: BONES AND JOINTS: Acute mildly displaced extraarticular fractures of the base of the third and fourth proximal phalanges. Slight dorsal angulation of the third and fourth digits. Subtle boutonniere deformity of the left fifth digit. Mild degenerative changes of the hand. No joint dislocation. SOFT TISSUES: Diffuse soft tissue swelling of the hand. IMPRESSION: 1. Acute mildly displaced extraarticular fractures of the base of the third and fourth proximal phalanges with slight dorsal angulation of the third and fourth digits. No dislocation. 2. Subtle boutonniere deformity of the left fifth digit. 3.  Diffuse soft tissue swelling of the hand. Electronically signed by: Dorethia Molt MD 09/10/2024 02:49 AM EST RP Workstation: [id number]     .Ortho Injury Treatment  Date/Time: 09/10/2024 5:11 AM  Performed by: Logan Ubaldo NOVAK, PA-C Authorized by: Logan Ubaldo NOVAK, PA-C   Consent:    Consent obtained:  Verbal   Consent given by:  Marshal location: hand Location details: left hand Injury type: fracture-dislocation Pre-procedure neurovascular assessment: neurovascularly intact Pre-procedure range of motion: reduced Manipulation performed: yes Skeletal traction used: yes Immobilization: splint Splint type: volar short arm Splint Applied by: Kemp Balm Post-procedure neurovascular assessment: post-procedure neurovascularly intact Post-procedure range of motion: unchanged      Medications Ordered in the ED  oxyCODONE -acetaminophen  (PERCOCET/ROXICET) 5-325 MG per tablet 2 tablet (2 tablets Oral Given 09/10/24 0521)                                    Medical Decision Making Amount and/or Complexity of Data Reviewed Radiology: ordered.  Risk Prescription drug management.   This patient presents to the ED for concern of left hand pain, this involves an extensive number of treatment options, and is a complaint that carries with it a high risk of complications and morbidity.  The differential diagnosis includes fracture, dislocation, soft tissue injury, others   Co morbidities / Chronic conditions that complicate the patient evaluation  Dementia   Additional history obtained:  Additional history obtained from EMR and patient's family member   Imaging Studies ordered:  I ordered imaging studies including plain films of the left hand  I independently visualized and interpreted imaging which showed  1. Acute mildly displaced extraarticular fractures of the base of the third and  fourth proximal phalanges with slight dorsal angulation of the third and fourth   digits. No dislocation.  2. Subtle boutonniere deformity of the left fifth digit.  3. Diffuse soft tissue swelling of the hand.   I agree with the radiologist interpretation  Problem List / ED Course / Critical interventions / Medication management   I ordered medication including percocet   Reevaluation of the patient after these medicines showed that the patient improved I have reviewed the patients home medicines and have made adjustments as needed   Consultations Obtained:  I requested consultation with the hand surgery,     Test / Admission - Considered:  Patient care transferred to [facility], PA-C.  Disposition pending recommendations of hand surgery      Final diagnoses:  Closed displaced fracture of proximal phalanx of left ring finger, initial encounter  Closed displaced fracture of proximal phalanx of left middle finger, initial encounter  ED Discharge Orders     None          Logan Ubaldo KATHEE DEVONNA 09/10/24 [id number]    Midge Golas, MD 09/10/24 [phone]

## 2024-09-10 NOTE — ED Provider Notes (Signed)
  Physical Exam  BP 124/80 (BP Location: Right Arm)   Pulse (!) 58   Temp 98 F (36.7 C)   Resp 18   Ht 5' 9 (1.753 m)   Wt 78 kg   SpO2 99%   BMI 25.40 kg/m   Physical Exam Vitals and nursing note reviewed.  Constitutional:      Appearance: Normal appearance.  HENT:     Head: Normocephalic and atraumatic.  Eyes:     General:        Right eye: No discharge.        Left eye: No discharge.     Conjunctiva/sclera: Conjunctivae normal.  Pulmonary:     Effort: Pulmonary effort is normal.  Musculoskeletal:     Comments: Volar short arm splint in place of the left upper extremity. Good cap refill. Sensation intact.   Skin:    General: Skin is warm and dry.     Findings: No rash.  Neurological:     General: No focal deficit present.     Mental Status: He is alert.  Psychiatric:        Mood and Affect: Mood normal.        Behavior: Behavior normal.     Procedures  Procedures  ED Course / MDM   Clinical Course as of 09/10/24 0723  Thu Sep 10, 2024  0721 I spoke with Dr. Romona with hand surgery who states that he can follow-up in the office next week. [CF]    Clinical Course User Index [CF] Theotis Cameron HERO, PA-C   Medical Decision Making Amount and/or Complexity of Data Reviewed Radiology: ordered.  Risk Prescription drug management.   Accepted handoff at shift change from [place] PA-C. Please see prior provider note for more detail.   Briefly: Patient is 73 y.o. male patient who presents to the emergency department today for further evaluation of left hand swelling.  Given the patient history of dementia history is limited.  Unclear when hand swelling started.  DDX: concern for fracture.  Plan: Follow-up with hand surgery next week.  Volar short arm in place.  Strict return precautions were discussed.  He is safe for discharge at this time.             Theotis Cameron HERO, PA-C 09/10/24 0723    Emil Share, DO 09/10/24 [phone]

## 2024-09-10 NOTE — Progress Notes (Signed)
 Orthopedic Tech Progress Note Patient Details:  [name] 04/19/1951 [id number] Reduction and splint application w/ ED PA's present. F/U with hand specialist  Ortho Devices Type of Ortho Device: Volar splint, Ace wrap, Cotton web roll Ortho Device/Splint Location: L HAND Ortho Device/Splint Interventions: Ordered, Application, Adjustment   Post Interventions Patient Tolerated: Fair Instructions Provided: Care of device  [name] L [name] 09/10/2024, 6:19 AM

## 2024-09-10 NOTE — Telephone Encounter (Signed)
 [facility] called to r/s pt's appointment

## 2024-09-11 ENCOUNTER — Encounter (HOSPITAL_COMMUNITY): Payer: Self-pay

## 2024-09-11 ENCOUNTER — Ambulatory Visit (HOSPITAL_COMMUNITY)
Admission: RE | Admit: 2024-09-11 | Discharge: 2024-09-11 | Disposition: A | Discharge status: Home / Self Care | Payer: Medicare (Managed Care) | Source: Ambulatory Visit | Attending: Internal Medicine | Admitting: Internal Medicine

## 2024-09-14 ENCOUNTER — Encounter: Payer: Self-pay | Admitting: Urgent Care

## 2024-09-15 ENCOUNTER — Other Ambulatory Visit: Payer: Self-pay

## 2024-09-15 ENCOUNTER — Inpatient Hospital Stay
Admission: RE | Admit: 2024-09-15 | Discharge: 2024-09-15 | Disposition: A | Discharge status: Home / Self Care | Payer: Medicare (Managed Care) | Source: Ambulatory Visit

## 2024-09-15 DIAGNOSIS — Z01812 Encounter for preprocedural laboratory examination: Secondary | ICD-10-CM

## 2024-09-15 HISTORY — DX: Hyperlipidemia, unspecified: E78.5

## 2024-09-15 HISTORY — DX: Gastro-esophageal reflux disease without esophagitis: K21.9

## 2024-09-15 HISTORY — DX: Long term (current) use of aspirin: Z79.82

## 2024-09-15 NOTE — Patient Instructions (Addendum)
 Your procedure is scheduled on: 09/16/24  Report to the Registration Desk on the 1st floor of the [facility]. To find out your arrival time, please call [phone] between 1PM - 3PM on:  If your arrival time is 6:00 am, do not arrive before that time as the [facility] entrance doors do not open until 6:00 am.  REMEMBER: Instructions that are not followed completely may result in serious medical risk, up to and including death; or upon the discretion of your surgeon and anesthesiologist your surgery may need to be rescheduled.  Do not eat food after midnight the night before surgery.  No gum chewing or hard candies.  You may however, drink CLEAR liquids up to 2 hours before you are scheduled to arrive for your surgery. Do not drink anything within 2 hours of your scheduled arrival time.  Clear liquids include: - water  - apple juice without pulp - gatorade (not RED colors) - black coffee or tea (Do NOT add milk or creamers to the coffee or tea) Do NOT drink anything that is not on this list.  **Type 1 and Type 2 diabetics should only drink water.**  In addition, your doctor has ordered for you to drink the provided:  Ensure Pre-Surgery Clear Carbohydrate Drink  Gatorade G2 Drinking this carbohydrate drink up to two hours before surgery helps to reduce insulin resistance and improve patient outcomes. Please complete drinking 2 hours before scheduled arrival time.  One week prior to surgery: Stop Anti-inflammatories (NSAIDS) such as Advil, Aleve, Ibuprofen, Motrin, Naproxen, Naprosyn and Aspirin  based products such as Excedrin, Goody's Powder, BC Powder. Stop ANY OVER THE COUNTER supplements until after surgery.  You may however, continue to take Tylenol  if needed for pain up until the day of surgery.  Continue taking all of your other prescription medications up until the day of surgery.  ON THE DAY OF SURGERY ONLY TAKE THESE MEDICATIONS WITH SIPS OF WATER:  Lacosamide  250  MG  levETIRAcetam  (KEPPRA ) 750 MG  memantine  (NAMENDA ) 10 MG  pantoprazole  (PROTONIX ) 40 MG    No Alcohol for 24 hours before or after surgery.  No Smoking including e-cigarettes for 24 hours before surgery.  No chewable tobacco products for at least 6 hours before surgery.  No nicotine  patches on the day of surgery.  Do not use any recreational drugs for at least a week (preferably 2 weeks) before your surgery.  Please be advised that the combination of cocaine and anesthesia may have negative outcomes, up to and including death. If you test positive for cocaine, your surgery will be cancelled.  On the morning of surgery brush your teeth with toothpaste and water, you may rinse your mouth with mouthwash if you wish. Do not swallow any toothpaste or mouthwash.  Use CHG Soap or wipes as directed on instruction sheet.  Do not wear jewelry, make-up, hairpins, clips or nail polish.  For welded (permanent) jewelry: bracelets, anklets, waist bands, etc.  Please have this removed prior to surgery.  If it is not removed, there is a chance that hospital personnel will need to cut it off on the day of surgery.  Do not wear lotions, powders, or perfumes.   Do not shave body hair from the neck down 48 hours before surgery.  Contact lenses, hearing aids and dentures may not be worn into surgery.  Do not bring valuables to the hospital. [facility] is not responsible for any missing/lost belongings or valuables.   Total Shoulder Arthroplasty:  use Benzoyl Peroxide 5% Gel as directed on instruction sheet.  Bring your C-PAP to the hospital in case you may have to spend the night.   Notify your doctor if there is any change in your medical condition (cold, fever, infection).  Wear comfortable clothing (specific to your surgery type) to the hospital.  After surgery, you can help prevent lung complications by doing breathing exercises.  Take deep breaths and cough every 1-2 hours. Your  doctor may order a device called an Incentive Spirometer to help you take deep breaths. When coughing or sneezing, hold a pillow firmly against your incision with both hands. This is called "splinting." Doing this helps protect your incision. It also decreases belly discomfort.  If you are being admitted to the hospital overnight, leave your suitcase in the car. After surgery it may be brought to your room.  In case of increased patient census, it may be necessary for you, the patient, to continue your postoperative care in the Same Day Surgery department.  If you are being discharged the day of surgery, you will not be allowed to drive home. You will need a responsible individual to drive you home and stay with you for 24 hours after surgery.   If you are taking public transportation, you will need to have a responsible individual with you.  Please call the Pre-admissions Testing Dept. at [phone] if you have any questions about these instructions.  Surgery Visitation Policy:  Patients having surgery or a procedure may have two visitors.  Children under the age of 20 must have an adult with them who is not the patient.  Inpatient Visitation:    Visiting hours are 7 a.m. to 8 p.m. Up to four visitors are allowed at one time in a patient room. The visitors may rotate out with other people during the day.  One visitor age 26 or older may stay with the patient overnight and must be in the room by 8 p.m.   Merchandiser, Retail to address health-related social needs:  [email or web address]

## 2024-09-15 NOTE — Progress Notes (Signed)
 Patient for IR TACE  procedure on Wed 09/16/24, I called and spoke with the patient's PACE contact, Randine on the phone and gave pre-procedure instructions. Randine was made aware to have the patient here at 7:30a, NPO after MN prior to procedure as well as driver post procedure/recovery/discharge. Randine stated understanding. Called 09/15/2024  Per Dr Philip, pt does not have to hold his ASA 81mg  for this procedure.

## 2024-09-16 ENCOUNTER — Ambulatory Visit
Admission: RE | Admit: 2024-09-16 | Discharge: 2024-09-16 | Disposition: A | Discharge status: Home / Self Care | Payer: Medicare (Managed Care) | Source: Ambulatory Visit | Attending: Interventional Radiology | Admitting: Interventional Radiology

## 2024-09-16 ENCOUNTER — Telehealth: Payer: Self-pay

## 2024-09-16 ENCOUNTER — Encounter: Payer: Self-pay | Admitting: Radiology

## 2024-09-16 ENCOUNTER — Other Ambulatory Visit: Payer: Self-pay

## 2024-09-16 DIAGNOSIS — K7400 Hepatic fibrosis, unspecified: Secondary | ICD-10-CM | POA: Insufficient documentation

## 2024-09-16 DIAGNOSIS — C220 Liver cell carcinoma: Secondary | ICD-10-CM | POA: Diagnosis present

## 2024-09-16 DIAGNOSIS — I69354 Hemiplegia and hemiparesis following cerebral infarction affecting left non-dominant side: Secondary | ICD-10-CM | POA: Diagnosis not present

## 2024-09-16 DIAGNOSIS — G40909 Epilepsy, unspecified, not intractable, without status epilepticus: Secondary | ICD-10-CM | POA: Diagnosis not present

## 2024-09-16 DIAGNOSIS — Z01812 Encounter for preprocedural laboratory examination: Secondary | ICD-10-CM

## 2024-09-16 DIAGNOSIS — B182 Chronic viral hepatitis C: Secondary | ICD-10-CM | POA: Diagnosis not present

## 2024-09-16 DIAGNOSIS — F0390 Unspecified dementia without behavioral disturbance: Secondary | ICD-10-CM | POA: Insufficient documentation

## 2024-09-16 DIAGNOSIS — Z604 Social exclusion and rejection: Secondary | ICD-10-CM | POA: Diagnosis not present

## 2024-09-16 DIAGNOSIS — K743 Primary biliary cirrhosis: Secondary | ICD-10-CM | POA: Diagnosis not present

## 2024-09-16 HISTORY — PX: IR EMBO TUMOR ORGAN ISCHEMIA INFARCT INC GUIDE ROADMAPPING: IMG5449

## 2024-09-16 LAB — COMPREHENSIVE METABOLIC PANEL WITH GFR
ALT: 8 U/L (ref 0–44)
AST: 18 U/L (ref 15–41)
Albumin: 4.4 g/dL (ref 3.5–5.0)
Alkaline Phosphatase: 241 U/L — ABNORMAL HIGH (ref 38–126)
Anion gap: 9 (ref 5–15)
BUN: 16 mg/dL (ref 8–23)
CO2: 26 mmol/L (ref 22–32)
Calcium: 9.6 mg/dL (ref 8.9–10.3)
Chloride: 103 mmol/L (ref 98–111)
Creatinine, Ser: 1.03 mg/dL (ref 0.61–1.24)
GFR, Estimated: >60 mL/min (ref >60)
Glucose, Bld: 99 mg/dL (ref 70–99)
Potassium: 4.9 mmol/L (ref 3.5–5.1)
Sodium: 138 mmol/L (ref 135–145)
Total Bilirubin: 0.3 mg/dL (ref 0.0–1.2)
Total Protein: 8.1 g/dL (ref 6.5–8.1)

## 2024-09-16 LAB — CBC WITH DIFFERENTIAL/PLATELET
Abs Immature Granulocytes: 0.02 K/uL (ref 0.00–0.07)
Basophils Absolute: 0 K/uL (ref 0.0–0.1)
Basophils Relative: 0 %
Eosinophils Absolute: 0.2 K/uL (ref 0.0–0.5)
Eosinophils Relative: 3 %
HCT: 41.5 % (ref 39.0–52.0)
Hemoglobin: 14.5 g/dL (ref 13.0–17.0)
Immature Granulocytes: 0 %
Lymphocytes Relative: 21 %
Lymphs Abs: 1.3 K/uL (ref 0.7–4.0)
MCH: 32.4 pg (ref 26.0–34.0)
MCHC: 34.9 g/dL (ref 30.0–36.0)
MCV: 92.8 fL (ref 80.0–100.0)
Monocytes Absolute: 0.6 K/uL (ref 0.1–1.0)
Monocytes Relative: 10 %
Neutro Abs: 4 K/uL (ref 1.7–7.7)
Neutrophils Relative %: 66 %
Platelets: 196 K/uL (ref 150–400)
RBC: 4.47 MIL/uL (ref 4.22–5.81)
RDW: 11.9 % (ref 11.5–15.5)
WBC: 6.1 K/uL (ref 4.0–10.5)
nRBC: 0 % (ref 0.0–0.2)

## 2024-09-16 LAB — PROTIME-INR
INR: 1 (ref 0.8–1.2)
Prothrombin Time: 13.6 s (ref 11.4–15.2)

## 2024-09-16 MED ORDER — IOHEXOL 300 MG/ML  SOLN
130.0000 mL | Freq: Once | INTRAMUSCULAR | Status: AC | PRN
  Administered 2024-09-16: 130 mL via INTRA_ARTERIAL

## 2024-09-16 MED ORDER — LIDOCAINE HCL 1 % IJ SOLN
3.0000 mL | Freq: Once | INTRAMUSCULAR | Status: AC
  Administered 2024-09-16: 3 mL via INTRADERMAL

## 2024-09-16 MED ORDER — HYDROMORPHONE HCL 1 MG/ML IJ SOLN
INTRAMUSCULAR | Status: AC | PRN
  Administered 2024-09-16: .5 mg via INTRAVENOUS

## 2024-09-16 MED ORDER — SODIUM CHLORIDE 0.9 % IV SOLN
8.0000 mg | Freq: Once | INTRAVENOUS | Status: AC
  Administered 2024-09-16: 8 mg via INTRAVENOUS
  Filled 2024-09-16: qty 4

## 2024-09-16 MED ORDER — DIPHENHYDRAMINE HCL 50 MG/ML IJ SOLN
INTRAMUSCULAR | Status: AC
  Filled 2024-09-16: qty 1

## 2024-09-16 MED ORDER — MIDAZOLAM HCL 2 MG/2ML IJ SOLN
INTRAMUSCULAR | Status: AC
Start: 2024-09-16 — End: 2024-09-16
  Filled 2024-09-16: qty 2

## 2024-09-16 MED ORDER — SODIUM CHLORIDE 0.9 % IV SOLN: INTRAVENOUS | Status: DC

## 2024-09-16 MED ORDER — FENTANYL CITRATE (PF) 100 MCG/2ML IJ SOLN
INTRAMUSCULAR | Status: AC
  Filled 2024-09-16: qty 2

## 2024-09-16 MED ORDER — DEXAMETHASONE SOD PHOSPHATE PF 10 MG/ML IJ SOLN
8.0000 mg | Freq: Once | INTRAMUSCULAR | Status: AC
  Administered 2024-09-16: 8 mg via INTRAVENOUS
  Filled 2024-09-16: qty 0.8

## 2024-09-16 MED ORDER — MOXIFLOXACIN HCL 400 MG PO TABS: 400.0000 mg | ORAL_TABLET | Freq: Every day | ORAL | 0 refills | Status: AC

## 2024-09-16 MED ORDER — MIDAZOLAM HCL (PF) 2 MG/2ML IJ SOLN
INTRAMUSCULAR | Status: AC | PRN
Start: 2024-09-16 — End: 2024-09-16
  Administered 2024-09-16: .5 mg via INTRAVENOUS
  Administered 2024-09-16: 1 mg via INTRAVENOUS
  Administered 2024-09-16: .5 mg via INTRAVENOUS

## 2024-09-16 MED ORDER — DIPHENHYDRAMINE HCL 50 MG/ML IJ SOLN
INTRAMUSCULAR | Status: AC | PRN
  Administered 2024-09-16: 25 mg via INTRAVENOUS

## 2024-09-16 MED ORDER — FENTANYL CITRATE (PF) 100 MCG/2ML IJ SOLN
INTRAMUSCULAR | Status: AC | PRN
  Administered 2024-09-16: 50 ug via INTRAVENOUS
  Administered 2024-09-16 (×2): 25 ug via INTRAVENOUS

## 2024-09-16 MED ORDER — HYDROMORPHONE HCL 1 MG/ML IJ SOLN
INTRAMUSCULAR | Status: AC
  Filled 2024-09-16: qty 0.5

## 2024-09-16 MED ORDER — ONDANSETRON 4 MG PO TBDP: 4.0000 mg | ORAL_TABLET | Freq: Three times a day (TID) | ORAL | 0 refills | Status: AC | PRN

## 2024-09-16 MED ORDER — LC BEADS 100-300UM IN SALINE
150.0000 mg | Freq: Once | Status: AC
  Administered 2024-09-16: 50 mg via INTRA_ARTERIAL
  Filled 2024-09-16: qty 75

## 2024-09-16 MED ORDER — LIDOCAINE HCL 1 % IJ SOLN
INTRAMUSCULAR | Status: AC
  Filled 2024-09-16: qty 20

## 2024-09-16 MED ORDER — PIPERACILLIN-TAZOBACTAM 3.375 G IVPB 30 MIN
3.3750 g | Freq: Once | INTRAVENOUS | Status: AC
  Administered 2024-09-16: 3.375 g via INTRAVENOUS
  Filled 2024-09-16: qty 50

## 2024-09-16 NOTE — H&P (Signed)
 Chief Complaint: Patient was seen in consultation today for hepatocellular carcinoma  Referring Physician(s): Dr. Gordy Starch  Supervising Physician: Karalee Beat  Patient Status: [facility] - Out-pt  History of Present Illness: [name] is a 73 y.o. male  with a history of dementia, seizure disorder, hepatitis C, and primary biliary cirrhosis with previous imaging concerning for Hepatocellular Carcinoma. MR Abdomen in June 2025 revealed suspicious liver lesion consistent with hepatocellular carcinoma.  After consideration for best treatment options and consultation with Dr. Karalee he and his family have elected to proceed with chemoembolization and ultimate microwave ablation.   Patient presents today in his usual state of health.  He is accompanied by his sister and nephew who assist with his care. All are aware of the goals of the procedure and are agreeable to proceed.  He has no significant health changes or concerns today.  He has been NPO.   Past Medical History:  Diagnosis Date   Chronic hepatitis C (HCC)    Dementia (HCC)    a.) on NMDA receptor antagonist (memantine )   Diverticulosis    Gait disorder    GERD (gastroesophageal reflux disease)    Gingival hypertrophy    Secondary to Dilantin    Hiatal hernia    History of alcoholism (HCC)    HLD (hyperlipidemia)    Hx of ischemic vertebrobasilar artery thalamic stroke (RIGHT)    Internal hemorrhoids    LEFT hemiparesis and alteration of sensations as late effects of stroke (HCC)    Long-term use of aspirin  therapy    Memory disorder 03/04/2013   Primary biliary cirrhosis (HCC)    Seizures (HCC)    a.) Tx'd with lacosamide  + levetiracetam  + phenytoin    Stroke (HCC)    Right frontal, right thalamic   Tubular adenoma of colon    Ulnar neuropathy of left upper extremity     Past Surgical History:  Procedure Laterality Date   COLONOSCOPY WITH PROPOFOL  N/A 07/10/2017   Procedure: COLONOSCOPY WITH PROPOFOL ;   Surgeon: Elicia Claw, MD;  Location: [facility];  Service: Gastroenterology;  Laterality: N/A;   ESOPHAGOGASTRODUODENOSCOPY (EGD) WITH PROPOFOL  N/A 07/10/2017   Procedure: ESOPHAGOGASTRODUODENOSCOPY (EGD) WITH PROPOFOL ;  Surgeon: Elicia Claw, MD;  Location: [facility];  Service: Gastroenterology;  Laterality: N/A;   HIP ARTHROPLASTY Left 11/26/2013   Procedure: LEFT HIP HEMIARTHROPLASTY;  Surgeon: [name], MD;  Location: [facility] OR;  Service: Orthopedics;  Laterality: Left;   IR RADIOLOGIST EVAL & MGMT  09/01/2024   WRIST SURGERY Left 95 & 96    Allergies: Patient has no known allergies.  Medications: Prior to Admission medications   Medication Sig Start Date End Date Taking? Authorizing Provider  aspirin  EC 81 MG tablet Take 1 tablet (81 mg total) by mouth daily. 03/04/20  Yes Vicci Barnie NOVAK, MD  Lacosamide  100 MG TABS Take 1 tablet (100 mg total) by mouth in the morning and at bedtime. 06/08/24 12/05/24 Yes Onita Duos, MD  Lacosamide  150 MG TABS Take 1 tablet (150 mg total) by mouth 2 (two) times daily. 06/02/24  Yes Cindy Garnette POUR, MD  levETIRAcetam  (KEPPRA ) 750 MG tablet Take 1,500 mg by mouth 2 (two) times daily.   Yes [provider]  memantine  (NAMENDA ) 10 MG tablet TAKE 1 TABLET BY MOUTH TWICE A DAY 08/12/24  Yes Vicci Barnie NOVAK, MD  pantoprazole  (PROTONIX ) 40 MG tablet TAKE 1 TABLET BY MOUTH EVERY DAY 08/12/24  Yes Vicci Barnie NOVAK, MD  phenytoin  (DILANTIN ) 100 MG ER capsule  Take 2 capsules (200 mg total) by mouth at bedtime. FOR SEIZURES 02/19/24  Yes Vicci Barnie NOVAK, MD  PHENYTOIN  INFATABS 50 MG tablet CHEW ONE-HALF TABLETS (25 MG TOTAL) BY MOUTH AT BEDTIME. 08/19/24  Yes Vicci Barnie NOVAK, MD  rosuvastatin  (CRESTOR ) 10 MG tablet Take 10 mg by mouth daily.   Yes [provider]  ursodiol  (ACTIGALL ) 500 MG tablet Take 500 mg by mouth 2 (two) times daily.   Yes [provider]  loratadine  (CLARITIN ) 10 MG tablet Take 10 mg  by mouth daily.    [provider]     Family History  Problem Relation Age of Onset   Pulmonary embolism Mother    Liver disease Father    Diabetes Sister    Hypertension Sister    Hypertension Sister    Hypertension Sister    Seizures Neg Hx     Social History   Socioeconomic History   Marital status: Divorced    Spouse name: Not on file   Number of children: 0   Years of education: 10   Highest education level: Not on file  Occupational History   Not on file  Tobacco Use   Smoking status: Former    Current packs/day: 0.00    Types: Cigarettes    Quit date: 01/02/2014    Years since quitting: 10.7   Smokeless tobacco: Former  Building Services Engineer status: Never Used  Substance and Sexual Activity   Alcohol use: No    Comment: Former Alcoholic, quit at age of 58   Drug use: No   Sexual activity: Yes  Other Topics Concern   Not on file  Social History Narrative   ** Merged History Encounter **       Patient is single and lives with a friend and her son. Patient does not have any children. Patient is disabled. Patient has a 10 th grade education. Patient is left-handed. Patient drinks some soda but not everyday.   Social Drivers of Corporate Investment Banker Strain: Low Risk  (06/25/2023)   Overall Financial Resource Strain (CARDIA)    Difficulty of Paying Living Expenses: Not hard at all  Food Insecurity: No Food Insecurity (06/24/2024)   Hunger Vital Sign    Worried About Running Out of Food in the Last Year: Never true    Ran Out of Food in the Last Year: Never true  Transportation Needs: No Transportation Needs (06/24/2024)   PRAPARE - Administrator, Civil Service (Medical): No    Lack of Transportation (Non-Medical): No  Physical Activity: Inactive (01/14/2024)   Exercise Vital Sign    Days of Exercise per Week: 0 days    Minutes of Exercise per Session: 0 min  Stress: No Stress Concern Present (06/25/2023)   [employer] of  Occupational Health - Occupational Stress Questionnaire    Feeling of Stress : Not at all  Social Connections: Socially Isolated (06/01/2024)   Social Connection and Isolation Panel    Frequency of Communication with Friends and Family: Never    Frequency of Social Gatherings with Friends and Family: More than three times a week    Attends Religious Services: Never    Database Administrator or Organizations: No    Attends Banker Meetings: Never    Marital Status: Divorced     Review of Systems: A 12 point ROS discussed and pertinent positives are indicated in the HPI above.  All other systems are  negative.  Review of Systems  Constitutional:  Negative for fatigue and fever.  Respiratory:  Negative for cough and shortness of breath.   Cardiovascular:  Negative for chest pain.  Gastrointestinal:  Negative for abdominal pain, nausea and vomiting.  Musculoskeletal:  Negative for back pain.  Psychiatric/Behavioral:  Negative for behavioral problems and confusion.     Vital Signs: BP 134/76   Pulse 83   Temp (!) 97.5 F (36.4 C) (Temporal)   Resp 16   Ht 5' 9 (1.753 m)   Wt 172 lb (78 kg)   SpO2 96%   BMI 25.40 kg/m   Physical Exam Vitals and nursing note reviewed.  Constitutional:      General: He is not in acute distress.    Appearance: Normal appearance. He is not ill-appearing.  HENT:     Mouth/Throat:     Mouth: Mucous membranes are moist.     Pharynx: Oropharynx is clear.  Cardiovascular:     Rate and Rhythm: Normal rate and regular rhythm.  Pulmonary:     Effort: Pulmonary effort is normal. No respiratory distress.     Breath sounds: Normal breath sounds.  Abdominal:     General: Abdomen is flat. There is no distension.  Skin:    General: Skin is warm and dry.  Neurological:     General: No focal deficit present.     Mental Status: He is alert and oriented to person, place, and time. Mental status is at baseline.  Psychiatric:        Mood and  Affect: Mood normal.        Behavior: Behavior normal.        Thought Content: Thought content normal.        Judgment: Judgment normal.      MD Evaluation Airway: WNL Heart: WNL Abdomen: WNL Chest/ Lungs: WNL ASA  Classification: 3 Mallampati/Airway Score: Two   Imaging: DG Hand Complete Left Result Date: 09/10/2024 EXAM: 3 OR MORE VIEW(S) XRAY OF THE LEFT HAND 09/10/2024 06:09:00 AM COMPARISON: 09/10/2024 CLINICAL HISTORY: Repeat post splint FINDINGS: BONES AND JOINTS: New splint. Comminuted fractures at the base of the third and fourth proximal phalanges, with dorsal angulation of the phalanges relative to the metacarpal bones. Healed fifth metacarpal fracture. Boutonniere deformity is noted involving the left 5th digit. No focal osseous lesion. SOFT TISSUES: Diffuse soft tissue swelling. IMPRESSION: 1. Comminuted fractures at the base of the third and fourth proximal phalanges with dorsal angulation of the phalanges relative to the metacarpal bones. 2. Diffuse soft tissue swelling. Electronically signed by: Waddell Calk MD 09/10/2024 07:19 AM EST RP Workstation: [id number]   DG Hand Complete Left Result Date: 09/10/2024 EXAM: 3 or more VIEW(S) XRAY OF THE LEFT HAND 09/10/2024 02:19:49 AM COMPARISON: None available. CLINICAL HISTORY: swelling FINDINGS: BONES AND JOINTS: Acute mildly displaced extraarticular fractures of the base of the third and fourth proximal phalanges. Slight dorsal angulation of the third and fourth digits. Subtle boutonniere deformity of the left fifth digit. Mild degenerative changes of the hand. No joint dislocation. SOFT TISSUES: Diffuse soft tissue swelling of the hand. IMPRESSION: 1. Acute mildly displaced extraarticular fractures of the base of the third and fourth proximal phalanges with slight dorsal angulation of the third and fourth digits. No dislocation. 2. Subtle boutonniere deformity of the left fifth digit. 3. Diffuse soft tissue swelling of the hand.  Electronically signed by: Dorethia Molt MD 09/10/2024 02:49 AM EST RP Workstation: [id number]   IR Radiologist Eval & Mgmt  Result Date: 09/01/2024 EXAM: NEW PATIENT OFFICE VISIT CHIEF COMPLAINT: SEE NOTE IN EPIC HISTORY OF PRESENT ILLNESS: SEE NOTE IN EPIC REVIEW OF SYSTEMS: SEE NOTE IN EPIC PHYSICAL EXAMINATION: SEE NOTE IN EPIC ASSESSMENT AND PLAN: SEE NOTE IN EPIC Electronically Signed   By: Wilkie Lent M.D.   On: 09/01/2024 14:12   MR LIVER W WO CONTRAST Result Date: 08/25/2024 EXAM: MRI Abdomen with and without Contrast 08/25/2024 01:29:37 PM TECHNIQUE: Multiplanar multisequence MRI of the abdomen was performed with and without the administration of intravenous contrast. 8 mL of gadobutrol  (GADAVIST ) 1 MMOL/ML injection was administered intravenously. COMPARISON: MRI 04/08/2004 CLINICAL HISTORY: Hepatocellular carcinoma, staging FINDINGS: LIVER: Enhancing lesion in the central right hepatic lobe (segment VIII) measures 37 x 37 mm compared to 30 x 27 mm on comparison exam remeasured. The lesion demonstrates washout and a peripheral rim pseudocapsule typical of hepatocellular carcinoma. There are no new enhancing lesions within the liver parenchyma. Small cysts within the left and right hepatic lobes are unchanged. GALLBLADDER AND BILIARY SYSTEM: Gallbladder is unremarkable. Biliary tree is normal. SPLEEN: Unremarkable. PANCREAS: The pancreas is normal. ADRENAL GLANDS: Unremarkable. KIDNEYS: Unremarkable. LYMPH NODES: Lymphadenopathy in the porta hepatis. VASCULATURE: Portal veins are patent and uninvolved by the tumor. PERITONEUM: No ascites. BOWEL: Grossly unremarkable. ABDOMINAL WALL: No acute abnormality. BONES: No acute abnormality. IMPRESSION: 1. Enhancing lesion in segment VIII of the liver measures 37 mm, increased from 30 mm on prior, with imaging features typical of hepatocellular carcinoma. 2. No new hepatic lesions. 3. No Porta hepatis lymphadenopathy. 4. No ascites. Pulmonary veins  patent Electronically signed by: Norleen Boxer MD 08/25/2024 01:56 PM EST RP Workstation: [id number]   CT Head Wo Contrast Result Date: 08/22/2024 EXAM: CT HEAD WITHOUT CONTRAST 08/22/2024 05:55:56 PM TECHNIQUE: CT of the head was performed without the administration of intravenous contrast. Automated exposure control, iterative reconstruction, and/or weight based adjustment of the mA/kV was utilized to reduce the radiation dose to as low as reasonably achievable. COMPARISON: 05/31/2024 CLINICAL HISTORY: seizure today, question of facial asymmetry FINDINGS: BRAIN AND VENTRICLES: No acute hemorrhage. No evidence of acute infarct. Scattered periventricular white matter hypoattenuation, consistent with mild chronic ischemic microvascular disease. Right parieto-occipital encephalomalacia. Volume loss with ex vacuo dilatation of right lateral ventricle involving posterior horn and atrium. No hydrocephalus. No extra-axial collection. No mass effect or midline shift. Calcified atherosclerotic plaque within cavernous/supraclinoid internal carotid arteries. ORBITS: No acute abnormality. SINUSES: No acute abnormality. SOFT TISSUES AND SKULL: No acute soft tissue abnormality. No skull fracture. Left mastoid and middle ear effusion. IMPRESSION: 1. No acute intracranial abnormality. 2. Right parieto-occipital encephalomalacia with associated volume loss and ex vacuo dilatation of the right lateral ventricle involving the posterior horn and atrium. 3. Left mastoid and middle ear effusion. Electronically signed by: Franky Stanford MD 08/22/2024 06:08 PM EDT RP Workstation: [id number]   DG Femur Min 2 Views Left Result Date: 08/22/2024 CLINICAL DATA:  Pain to left femur following seizure/fall. EXAM: LEFT FEMUR 2 VIEWS COMPARISON:  09/12/2023. FINDINGS: Total hip arthroplasty changes are noted on the left without evidence of hardware loosening. No acute fracture or dislocation is seen. Mild degenerative changes are noted at the  knee. There is no joint effusion at the knee. Vascular calcifications are present in the soft tissues. IMPRESSION: Status post total hip arthroplasty without evidence of acute fracture or hardware loosening. Electronically Signed   By: Leita Birmingham M.D.   On: 08/22/2024 16:36    Labs:  CBC: Recent Labs    06/29/24  2151 08/22/24 1543 08/24/24 1017 09/16/24 0739  WBC 6.7 6.1 4.9 6.1  HGB 13.8 13.8 14.4 14.5  HCT 40.2 40.7 39.4 41.5  PLT 183 172 165 196    COAGS: Recent Labs    05/14/24 1208 09/16/24 0739  INR 1.2* 1.0    BMP: Recent Labs    06/29/24 2151 08/22/24 1543 08/24/24 1017 09/16/24 0739  NA 142 136 137 138  K 4.7 4.1 4.1 4.9  CL 105 104 107 103  CO2 24 24 23 26   GLUCOSE 113* 106* 119* 99  BUN 15 18 16 16   CALCIUM  9.6 9.2 9.2 9.6  CREATININE 0.95 0.90 0.96 1.03  GFRNONAA >60 >60 >60 >60    LIVER FUNCTION TESTS: Recent Labs    05/31/24 1920 06/29/24 2151 08/24/24 1017 09/16/24 0739  BILITOT 0.7 0.3 0.5 0.3  AST 18 18 14* 18  ALT 14 10 11 8   ALKPHOS 146* 194* 215* 241*  PROT 7.2 7.2 7.8 8.1  ALBUMIN 3.7 4.2 4.2 4.4    TUMOR MARKERS: Recent Labs    05/14/24 1208  AFPTM 1.5  CEA <2.0  CA199 19    Assessment and Plan: Patient with past medical history of stroke, liver fibrosis presents with complaint of hepatocellular carcinoma.  IR consulted for liver-directed therapies at the request of Dr. Albertus. Case reviewed by Dr. Karalee who has met with patient and his family in consultation and has approved patient for staged treatment.  Planned today is an angiogram with chemoembolization.  All are aware of plans to return in 2-4 weeks for targeted ablation once tumor size reduced.   Patient presents today in their usual state of health.  He has been NPO and is not currently on blood thinners.   Risks and benefits discussed with the patient including, but not limited to bleeding, infection, vascular injury, post procedural pain, nausea,  vomiting and fatigue, contrast induced renal failure, liver failure, neutropenia and possible need for additional procedures.  All of the patient's questions were answered, patient is agreeable to proceed. Consent signed and in chart.   Advance Care Plan: The advanced care plan/surrogate decision maker was discussed at the time of visit and documented in the medical record.     Thank you for this interesting consult.  I greatly enjoyed meeting JAMINE HIGHFILL and look forward to participating in their care.  A copy of this report was sent to the requesting provider on this date.  Electronically Signed: Erby Sanderson Sue-Ellen Zvi Duplantis, PA 09/16/2024, 8:45 AM   I spent a total of  30 Minutes  in face to face in clinical consultation, greater than 50% of which was counseling/coordinating care for hepatocellular carcinoma.

## 2024-09-16 NOTE — Telephone Encounter (Signed)
 LVM for pt to call back to get him scheduled to see Dr. Gregg sooner.

## 2024-09-16 NOTE — Procedures (Signed)
 Interventional Radiology Procedure Note  Procedure: Successful DEB-TACE   Complications: None  Estimated Blood Loss: None  Recommendations: - DC home - moxifloxacin  x 7 days - return for MWA    Signed,  Wilkie LOIS Lent, MD

## 2024-09-16 NOTE — Progress Notes (Signed)
 Post-procedure prescription for Moxifloxacin  400mg  daily x7 and zofran  4mg  PRN sent to preferred pharmacy ([facility] [address]  Jaxton Casale, MS RD PA-C

## 2024-09-21 NOTE — Telephone Encounter (Signed)
 [facility] called to request for Pt to be seen sooner due to getting clearance for Surgery  , Informed that  Office have called Pt to get sooner appt ,[name] would like a call back to schedule PT APPT   Callback number is  [phone]

## 2024-09-22 NOTE — Telephone Encounter (Signed)
 LVM for [facility] to call back to get pt scheduled.

## 2024-09-22 NOTE — Telephone Encounter (Signed)
 Got pt scheduled for sooner appointment 10/30/24 @10 :45am

## 2024-09-24 ENCOUNTER — Emergency Department (HOSPITAL_COMMUNITY)
Admission: EM | Admit: 2024-09-24 | Discharge: 2024-09-24 | Disposition: A | Discharge status: Home / Self Care | Payer: Medicare (Managed Care) | Attending: Emergency Medicine | Admitting: Emergency Medicine

## 2024-09-24 DIAGNOSIS — Z7982 Long term (current) use of aspirin: Secondary | ICD-10-CM | POA: Diagnosis not present

## 2024-09-24 DIAGNOSIS — F0390 Unspecified dementia without behavioral disturbance: Secondary | ICD-10-CM | POA: Diagnosis not present

## 2024-09-24 DIAGNOSIS — R569 Unspecified convulsions: Secondary | ICD-10-CM | POA: Insufficient documentation

## 2024-09-24 DIAGNOSIS — Z79899 Other long term (current) drug therapy: Secondary | ICD-10-CM | POA: Insufficient documentation

## 2024-09-24 DIAGNOSIS — G40909 Epilepsy, unspecified, not intractable, without status epilepticus: Secondary | ICD-10-CM

## 2024-09-24 LAB — CBC WITH DIFFERENTIAL/PLATELET
Abs Immature Granulocytes: 0.03 K/uL (ref 0.00–0.07)
Basophils Absolute: 0 K/uL (ref 0.0–0.1)
Basophils Relative: 0 %
Eosinophils Absolute: 0.2 K/uL (ref 0.0–0.5)
Eosinophils Relative: 3 %
HCT: 37.7 % — ABNORMAL LOW (ref 39.0–52.0)
Hemoglobin: 13.3 g/dL (ref 13.0–17.0)
Immature Granulocytes: 1 %
Lymphocytes Relative: 12 %
Lymphs Abs: 0.8 K/uL (ref 0.7–4.0)
MCH: 32.8 pg (ref 26.0–34.0)
MCHC: 35.3 g/dL (ref 30.0–36.0)
MCV: 92.9 fL (ref 80.0–100.0)
Monocytes Absolute: 0.8 K/uL (ref 0.1–1.0)
Monocytes Relative: 12 %
Neutro Abs: 4.7 K/uL (ref 1.7–7.7)
Neutrophils Relative %: 72 %
Platelets: 239 K/uL (ref 150–400)
RBC: 4.06 MIL/uL — ABNORMAL LOW (ref 4.22–5.81)
RDW: 11.8 % (ref 11.5–15.5)
WBC: 6.4 K/uL (ref 4.0–10.5)
nRBC: 0 % (ref 0.0–0.2)

## 2024-09-24 LAB — COMPREHENSIVE METABOLIC PANEL WITH GFR
ALT: 29 U/L (ref 0–44)
AST: 33 U/L (ref 15–41)
Albumin: 3.9 g/dL (ref 3.5–5.0)
Alkaline Phosphatase: 204 U/L — ABNORMAL HIGH (ref 38–126)
Anion gap: 10 (ref 5–15)
BUN: 11 mg/dL (ref 8–23)
CO2: 27 mmol/L (ref 22–32)
Calcium: 9.5 mg/dL (ref 8.9–10.3)
Chloride: 100 mmol/L (ref 98–111)
Creatinine, Ser: 0.89 mg/dL (ref 0.61–1.24)
GFR, Estimated: >60 mL/min (ref >60)
Glucose, Bld: 108 mg/dL — ABNORMAL HIGH (ref 70–99)
Potassium: 4.2 mmol/L (ref 3.5–5.1)
Sodium: 137 mmol/L (ref 135–145)
Total Bilirubin: 0.5 mg/dL (ref 0.0–1.2)
Total Protein: 8 g/dL (ref 6.5–8.1)

## 2024-09-24 LAB — CBG MONITORING, ED: Glucose-Capillary: 107 mg/dL — ABNORMAL HIGH (ref 70–99)

## 2024-09-24 LAB — PHENYTOIN LEVEL, TOTAL: Phenytoin Lvl: 8.8 ug/mL — ABNORMAL LOW (ref 10.0–20.0)

## 2024-09-24 MED ORDER — SODIUM CHLORIDE 0.9 % IV SOLN
1000.0000 mg | Freq: Once | INTRAVENOUS | Status: AC
  Administered 2024-09-24: 1000 mg via INTRAVENOUS
  Filled 2024-09-24: qty 20

## 2024-09-24 MED ORDER — LACTATED RINGERS IV SOLN: INTRAVENOUS | Status: DC

## 2024-09-24 NOTE — ED Provider Notes (Signed)
 [place] [facility] AT [facility] Provider Note   CSN: [id number] Arrival date & time: 09/24/24  1125     Patient presents with: Seizures   [name] is a 73 y.o. male.   73 year old male presents after mini seizures x 2 at home.  Patient has a history of seizure disorder.  He does take Keppra  as well as lacosamide .  Does have a history of dementia and therefore the history is very difficult to obtain.  CBG was 147 prior EMS.  According to EMS, patient's for seizure was brief but second lasted for 15 minutes and was postictal afterwards.  No history of trauma.  Presents for further evaluation.       Prior to Admission medications   Medication Sig Start Date End Date Taking? Authorizing Provider  aspirin  EC 81 MG tablet Take 1 tablet (81 mg total) by mouth daily. 03/04/20   Vicci Barnie NOVAK, MD  Lacosamide  100 MG TABS Take 1 tablet (100 mg total) by mouth in the morning and at bedtime. 06/08/24 12/05/24  Onita Duos, MD  Lacosamide  150 MG TABS Take 1 tablet (150 mg total) by mouth 2 (two) times daily. 06/02/24   Cindy Garnette POUR, MD  levETIRAcetam  (KEPPRA ) 750 MG tablet Take 1,500 mg by mouth 2 (two) times daily.    [provider]  loratadine  (CLARITIN ) 10 MG tablet Take 10 mg by mouth daily.    [provider]  memantine  (NAMENDA ) 10 MG tablet TAKE 1 TABLET BY MOUTH TWICE A DAY 08/12/24   Vicci Barnie NOVAK, MD  ondansetron  (ZOFRAN -ODT) 4 MG disintegrating tablet Take 1 tablet (4 mg total) by mouth every 8 (eight) hours as needed for up to 12 doses for nausea or vomiting. 09/16/24   Matthews, Kacie Sue-Ellen, PA  pantoprazole  (PROTONIX ) 40 MG tablet TAKE 1 TABLET BY MOUTH EVERY DAY 08/12/24   Vicci Barnie NOVAK, MD  phenytoin  (DILANTIN ) 100 MG ER capsule Take 2 capsules (200 mg total) by mouth at bedtime. FOR SEIZURES 02/19/24   Vicci Barnie NOVAK, MD  PHENYTOIN  INFATABS 50 MG tablet CHEW ONE-HALF TABLETS (25 MG TOTAL) BY MOUTH AT BEDTIME.  08/19/24   Vicci Barnie NOVAK, MD  rosuvastatin  (CRESTOR ) 10 MG tablet Take 10 mg by mouth daily.    [provider]  ursodiol  (ACTIGALL ) 500 MG tablet Take 500 mg by mouth 2 (two) times daily.    [provider]    Allergies: Patient has no known allergies.    Review of Systems  All other systems reviewed and are negative.   Updated Vital Signs BP 132/77 (BP Location: Right Arm)   Pulse 65   Temp 98.2 F (36.8 C) (Oral)   Resp 13   SpO2 91%   Physical Exam Vitals and nursing note reviewed.  Constitutional:      General: He is not in acute distress.    Appearance: Normal appearance. He is well-developed. He is not toxic-appearing.  HENT:     Head: Normocephalic and atraumatic.  Eyes:     General: Lids are normal.     Conjunctiva/sclera: Conjunctivae normal.     Pupils: Pupils are equal, round, and reactive to light.  Neck:     Thyroid : No thyroid  mass.     Trachea: No tracheal deviation.  Cardiovascular:     Rate and Rhythm: Normal rate and regular rhythm.     Heart sounds: Normal heart sounds. No murmur heard.    No gallop.  Pulmonary:  Effort: Pulmonary effort is normal. No respiratory distress.     Breath sounds: Normal breath sounds. No stridor. No decreased breath sounds, wheezing, rhonchi or rales.  Abdominal:     General: There is no distension.     Palpations: Abdomen is soft.     Tenderness: There is no abdominal tenderness. There is no rebound.  Musculoskeletal:        General: No tenderness. Normal range of motion.     Cervical back: Normal range of motion and neck supple.  Skin:    General: Skin is warm and dry.     Findings: No abrasion or rash.  Neurological:     General: No focal deficit present.     Mental Status: He is alert. Mental status is at baseline. He is disoriented.     GCS: GCS eye subscore is 4. GCS verbal subscore is 5. GCS motor subscore is 6.     Cranial Nerves: Cranial nerves 2-12 are intact. No cranial nerve  deficit.     Sensory: No sensory deficit.     Motor: Motor function is intact.  Psychiatric:        Attention and Perception: Attention normal.        Speech: Speech normal.        Behavior: Behavior normal.     (all labs ordered are listed, but only abnormal results are displayed) Labs Reviewed  CBG MONITORING, ED - Abnormal; Notable for the following components:      Result Value   Glucose-Capillary 107 (*)    All other components within normal limits  CBC WITH DIFFERENTIAL/PLATELET  COMPREHENSIVE METABOLIC PANEL WITH GFR  LEVETIRACETAM  LEVEL  PHENYTOIN  LEVEL, TOTAL    EKG: EKG Interpretation Date/Time:  Thursday September 24 2024 11:33:20 EST Ventricular Rate:  65 PR Interval:  201 QRS Duration:  98 QT Interval:  380 QTC Calculation: 396 R Axis:   50  Text Interpretation: Sinus rhythm Abnormal R-wave progression, early transition No significant change since last tracing Confirmed by Dasie Faden ([id number]) on 09/24/2024 11:35:44 AM  Radiology: No results found.   Procedures   Medications Ordered in the ED  lactated ringers  infusion (has no administration in time range)                                    Medical Decision Making Amount and/or Complexity of Data Reviewed Labs: ordered.  Risk Prescription drug management.   Patient is EKG shows normal sinus rhythm.  Patient on Dilantin  here and level is low.  Patient given 1 g of Dilantin  here..  A Keppra  level drawn.  Monitor here no seizure activity.  Family at bedside to take patient home     Final diagnoses:  None    ED Discharge Orders     None          Dasie Faden, MD 09/24/24 1520

## 2024-09-24 NOTE — ED Triage Notes (Signed)
 Per EMS patient coming from home after seizures x 2, second lasting 15-20 minutes per family, post ictal upon EMS arrival, hx of dementia, confused at baseline. BP 130/80, HR 70, CBG 147. Placed on 2L O2 [place], satting 89% on RA for EMS.

## 2024-09-24 NOTE — ED Notes (Signed)
 Patient alert and oriented x 4. Airway patent, respirations even and unlabored. Skin normal, warm and dry. Discharge instructions discussed with patient and patient's family. Patient has no questions at this time. Patient has safe ride home.

## 2024-09-26 LAB — LEVETIRACETAM LEVEL: Levetiracetam Lvl: 7.6 ug/mL — ABNORMAL LOW (ref 10.0–40.0)

## 2024-09-29 ENCOUNTER — Other Ambulatory Visit: Payer: Self-pay | Admitting: Diagnostic Radiology

## 2024-09-29 DIAGNOSIS — Z01818 Encounter for other preprocedural examination: Secondary | ICD-10-CM

## 2024-09-29 NOTE — Progress Notes (Signed)
  Patient for CT MWA Liver lesion and biopsy procedure on Wed 09/30/24, I called and spoke with the patient's [facility] contact, Randine on the phone and gave pre-procedure instructions. Randine was made aware to have the patient here at 7a, last dose of ASA 81mg  was Thurs 09/24/24, NPO after MN prior to procedure as well as driver post procedure/recovery/discharge. Randine stated understanding. Called 09/29/24

## 2024-09-30 ENCOUNTER — Encounter: Payer: Self-pay | Admitting: Certified Registered"

## 2024-09-30 ENCOUNTER — Ambulatory Visit: Payer: Medicare (Managed Care) | Admitting: Nurse Practitioner

## 2024-09-30 ENCOUNTER — Ambulatory Visit
Admission: RE | Admit: 2024-09-30 | Discharge: 2024-09-30 | Disposition: A | Payer: Medicare (Managed Care) | Source: Ambulatory Visit | Attending: Interventional Radiology | Admitting: Interventional Radiology

## 2024-10-01 ENCOUNTER — Ambulatory Visit (HOSPITAL_COMMUNITY): Payer: Self-pay

## 2024-10-06 ENCOUNTER — Ambulatory Visit: Payer: Medicare (Managed Care) | Admitting: Physician Assistant

## 2024-10-06 ENCOUNTER — Other Ambulatory Visit: Payer: Self-pay

## 2024-10-06 ENCOUNTER — Other Ambulatory Visit: Payer: Self-pay | Admitting: Radiology

## 2024-10-06 DIAGNOSIS — Z8781 Personal history of (healed) traumatic fracture: Secondary | ICD-10-CM

## 2024-10-06 DIAGNOSIS — M79642 Pain in left hand: Secondary | ICD-10-CM

## 2024-10-06 NOTE — Progress Notes (Signed)
 Office Visit Note   Patient: [name]           Date of Birth: Nov 03, 1950           MRN: [id number] Visit Date: 10/06/2024              Requested by: Cloria Annabella CROME, DO [address] [place],  [place] [id number] PCP: Cloria Annabella CROME, DO   Assessment & Plan: Visit Diagnoses:  1. Pain in left hand     Plan: Impression is 4 weeks status post left hand proximal 3rd and 4th phalanx fractures.  Due to the patient's current state of dementia and low demand, we will treat this nonoperatively.  He is currently not in any pain.  I have recommended using his cane with the right hand if possible.  He will follow-up with us  in 4 weeks for repeat evaluation and three-view x-rays of the left hand.  We will also make referral to [facility] for her osteoporosis clinic as he has also fractured his ankle and hip in the past.  Call with concerns or questions.  Follow-Up Instructions: Return in about 4 weeks (around 11/03/2024).   Orders:  Orders Placed This Encounter  Procedures   XR Hand Complete Left   No orders of the defined types were placed in this encounter.     Procedures: No procedures performed   Clinical Data: No additional findings.   Subjective: Chief Complaint  Patient presents with   Left Hand - Pain    HPI patient is a pleasant 73 year old right-hand-dominant demented gentleman here today with his wife.  He sustained a seizure and fractured the 3rd and 4th proximal phalanx of the left hand approximately 4 weeks ago.  He was seen in the ED where x-rays were obtained.  He is here today for further evaluation treat recommendation.  He does get a pace of [place] 3 days a week.  He is denying any pain to the left hand.  He does ambulate at times with a cane or walker.  Review of Systems as detailed in HPI.  All others reviewed and are negative.   Objective: Vital Signs: There were no vitals taken for this visit.  Physical Exam well-developed gentleman in no acute  distress.  Ortho Exam left hand exam shows moderate swelling to the 3rd and 4th proximal phalanx.  He does not have any tenderness to palpation.  He is able to make a full composite fist.  He is neurovascularly intact distally.  Specialty Comments:  No specialty comments available.  Imaging: XR Hand Complete Left Result Date: 10/06/2024 Stable 3rd and 4th proximal phalanx fractures    PMFS History: Patient Active Problem List   Diagnosis Date Noted   Hepatocellular carcinoma (HCC) 06/25/2024   Breakthrough seizure (HCC) 06/01/2024   Closed nondisplaced fracture of lateral malleolus of right fibula 06/01/2024   Community acquired pneumonia 01/10/2024   ARF (acute renal failure) 01/10/2024   GERD (gastroesophageal reflux disease) 01/10/2024   Pneumonia 01/10/2024   Thrombocytopenia 12/29/2023   Acute metabolic encephalopathy 12/29/2023   Liver mass 12/29/2023   Influenza A 12/29/2023   Seizure disorder (HCC) 12/29/2023   Dementia (HCC) 12/29/2023   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   Vascular dementia with other behavioral disturbance, unspecified dementia severity (HCC) 08/20/2023   Pain due to onychomycosis of toenails of both feet 02/28/2023   Sepsis (HCC) 02/23/2023   Barrett's esophagus 12/25/2021   Bell's palsy 08/23/2020   Elevated alkaline phosphatase  level 11/17/2019   Adenomatous polyp of colon 09/02/2017   Functional urinary incontinence 09/02/2017   Hemiparesis and alteration of sensations as late effects of stroke (HCC) 01/25/2017   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/23/2015   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   Hip fracture (HCC) 11/26/2013   break through seizure 11/26/2013   Memory disorder 03/04/2013   Depressive disorder, not elsewhere classified 07/24/2012   Abnormality of gait 07/24/2012   Focal epilepsy with impairment of consciousness (HCC) 07/24/2012   Cerebral thrombosis with cerebral infarction (HCC) 07/24/2012   Lesion of ulnar nerve 07/24/2012    Past Medical History:  Diagnosis Date   Chronic hepatitis C (HCC)    Dementia (HCC)    a.) on NMDA receptor antagonist (memantine )   Diverticulosis    Gait disorder    GERD (gastroesophageal reflux disease)    Gingival hypertrophy    Secondary to Dilantin    Hiatal hernia    History of alcoholism (HCC)    HLD (hyperlipidemia)    Hx of ischemic vertebrobasilar artery thalamic stroke (RIGHT)    Internal hemorrhoids    LEFT hemiparesis and alteration of sensations as late effects of stroke (HCC)    Long-term use of aspirin  therapy    Memory disorder 03/04/2013   Primary biliary cirrhosis (HCC)    Seizures (HCC)    a.) Tx'd with lacosamide  + levetiracetam  + phenytoin    Stroke (HCC)    Right frontal, right thalamic   Tubular adenoma of colon    Ulnar neuropathy of left upper extremity     Family History  Problem Relation Age of Onset   Pulmonary embolism Mother    Liver disease Father    Diabetes Sister    Hypertension Sister    Hypertension Sister    Hypertension Sister    Seizures Neg Hx     Past Surgical History:  Procedure Laterality Date   COLONOSCOPY WITH PROPOFOL  N/A 07/10/2017   Procedure: COLONOSCOPY WITH PROPOFOL ;  Surgeon: Elicia Claw, MD;  Location: [facility];  Service: Gastroenterology;  Laterality: N/A;   ESOPHAGOGASTRODUODENOSCOPY (EGD) WITH PROPOFOL  N/A 07/10/2017   Procedure: ESOPHAGOGASTRODUODENOSCOPY (EGD) WITH PROPOFOL ;  Surgeon: Elicia Claw, MD;  Location: [facility] ENDOSCOPY;  Service: Gastroenterology;  Laterality: N/A;   HIP ARTHROPLASTY Left 11/26/2013   Procedure: LEFT HIP HEMIARTHROPLASTY;  Surgeon: [name], MD;  Location: [facility] OR;  Service: Orthopedics;  Laterality: Left;   IR EMBO TUMOR ORGAN ISCHEMIA INFARCT INC GUIDE ROADMAPPING  09/16/2024   IR RADIOLOGIST EVAL & MGMT  09/01/2024   WRIST SURGERY Left 95 & 96   Social History   Occupational History   Not on file  Tobacco Use   Smoking status: Former    Current  packs/day: 0.00    Average packs/day: 3.0 packs/day    Types: Cigarettes    Quit date: 01/02/2014    Years since quitting: 10.7   Smokeless tobacco: Former  Building Services Engineer status: Never Used  Substance and Sexual Activity   Alcohol use: No    Comment: Former Alcoholic, quit at age of 77   Drug use: No   Sexual activity: Yes

## 2024-10-06 NOTE — Addendum Note (Signed)
 Addended by: Delrose Rohwer on: 10/06/2024 11:43 AM   Modules accepted: Orders

## 2024-10-19 ENCOUNTER — Other Ambulatory Visit: Payer: Self-pay

## 2024-10-19 ENCOUNTER — Encounter: Payer: Self-pay | Admitting: Physician Assistant

## 2024-10-19 ENCOUNTER — Telehealth: Payer: Self-pay

## 2024-10-19 ENCOUNTER — Ambulatory Visit: Payer: Medicare (Managed Care) | Admitting: Physician Assistant

## 2024-10-19 DIAGNOSIS — M810 Age-related osteoporosis without current pathological fracture: Secondary | ICD-10-CM | POA: Insufficient documentation

## 2024-10-19 DIAGNOSIS — Z01818 Encounter for other preprocedural examination: Secondary | ICD-10-CM

## 2024-10-19 NOTE — Telephone Encounter (Signed)
 Patient for CT guided Liver MWA with general anesthesia on Tues 10/20/24, I called and spoke with Damien the patient's Social Worker at [employer] on the phone and gave pre-procedure instructions. Damien was made aware to have the patient here at 7:30a, last dose of ASA 81mg  was Wed 10/14/24, NPO after MN prior to procedure as well as driver post procedure/recovery/discharge. Damien stated understanding. Called 10/19/2024  I also spoke with Randine at [facility]. They both are aware that if the pt's nephew does not bring him and he rides with [facility], that he will need someone to ride with him to take responsibility for him.

## 2024-10-19 NOTE — H&P (Signed)
 "     Chief Complaint: HCC with enlarging liver lesion. Patient presents for [facility] ablation.   Referring Physician(s): Dr. Gordy Starch  Supervising Physician: Karalee Beat  Patient Status: [facility] - Out-pt  History of Present Illness: [name] is a 73 y.o. male outpatient. History of dementia, seizure disorder, hepatitis C, and primary biliary cirrhosis complicated by hepatocellular carcinoma. Found to have a single 4 cm lesson in segment 8 of the liver. he liver lesion was found to be enlarging on Joshua Liver dated 11.4.25. Patient was deemed not to be a  transplant or surgical candidate  The patient was seen along with his nephew in IR clinic on 11.11.25. At that time [name] and his nephew  ( his medical power of attorney) decided to proceed with Neoadjuvant  treatment  of Deb TACE  and microwave ablation. On 11.27.25 IR performed a Deb Tace  with 50 mg of doxorubicin .   Patient presents for the second part of the procedure, the microwave ablation of the liver lesion in segment 8 with anesthesia.   Sister at bedside. Currently without any significant complaints. Patient alert and laying in bed,calm. Denies any fevers, headache, chest pain, SOB, cough, abdominal pain, nausea, vomiting or bleeding.    Labs pending. All medications are within acceptable parameters. NKDA. Patient has been NPO since midnight.   Return precautions and treatment recommendations and follow-up discussed with the patient 's sister [name] who is agreeable with the plan.   Past Medical History:  Diagnosis Date   Chronic hepatitis C (HCC)    Dementia (HCC)    a.) on NMDA receptor antagonist (memantine )   Diverticulosis    Gait disorder    GERD (gastroesophageal reflux disease)    Gingival hypertrophy    Secondary to Dilantin    Hiatal hernia    History of alcoholism (HCC)    HLD (hyperlipidemia)    Hx of ischemic vertebrobasilar artery thalamic stroke (RIGHT)    Internal hemorrhoids    LEFT  hemiparesis and alteration of sensations as late effects of stroke (HCC)    Long-term use of aspirin  therapy    Memory disorder 03/04/2013   Primary biliary cirrhosis (HCC)    Seizures (HCC)    a.) Tx'd with lacosamide  + levetiracetam  + phenytoin    Stroke (HCC)    Right frontal, right thalamic   Tubular adenoma of colon    Ulnar neuropathy of left upper extremity     Past Surgical History:  Procedure Laterality Date   COLONOSCOPY WITH PROPOFOL  N/A 07/10/2017   Procedure: COLONOSCOPY WITH PROPOFOL ;  Surgeon: Elicia Claw, MD;  [facility];  Service: Gastroenterology;  Laterality: N/A;   ESOPHAGOGASTRODUODENOSCOPY (EGD) WITH PROPOFOL  N/A 07/10/2017   Procedure: ESOPHAGOGASTRODUODENOSCOPY (EGD) WITH PROPOFOL ;  Surgeon: Elicia Claw, MD;  [facility]: [facility];  Service: Gastroenterology;  Laterality: N/A;   HIP ARTHROPLASTY Left 11/26/2013   Procedure: LEFT HIP HEMIARTHROPLASTY;  Surgeon: [name], MD;  [facility]: [facility] OR;  Service: Orthopedics;  Laterality: Left;   IR EMBO TUMOR ORGAN ISCHEMIA INFARCT INC GUIDE ROADMAPPING  09/16/2024   IR RADIOLOGIST EVAL & MGMT  09/01/2024   WRIST SURGERY Left 95 & 96    Allergies: Patient has no known allergies.  Medications: Prior to Admission medications  Medication Sig Start Date End Date Taking? Authorizing Provider  aspirin  EC 81 MG tablet Take 1 tablet (81 mg total) by mouth daily. 03/04/20   Vicci Barnie NOVAK, MD  Lacosamide  100 MG TABS Take 1 tablet (100 mg  total) by mouth in the morning and at bedtime. 06/08/24 12/05/24  Onita Duos, MD  Lacosamide  150 MG TABS Take 1 tablet (150 mg total) by mouth 2 (two) times daily. 06/02/24   Cindy Garnette POUR, MD  levETIRAcetam  (KEPPRA ) 750 MG tablet Take 1,500 mg by mouth 2 (two) times daily.    [provider]  loratadine  (CLARITIN ) 10 MG tablet Take 10 mg by mouth daily.    [provider]  memantine  (NAMENDA ) 10 MG tablet TAKE 1 TABLET BY MOUTH TWICE A DAY  08/12/24   Vicci Barnie NOVAK, MD  ondansetron  (ZOFRAN -ODT) 4 MG disintegrating tablet Take 1 tablet (4 mg total) by mouth every 8 (eight) hours as needed for up to 12 doses for nausea or vomiting. 09/16/24   Matthews, Kacie Sue-Ellen, PA  pantoprazole  (PROTONIX ) 40 MG tablet TAKE 1 TABLET BY MOUTH EVERY DAY 08/12/24   Vicci Barnie NOVAK, MD  phenytoin  (DILANTIN ) 100 MG ER capsule Take 2 capsules (200 mg total) by mouth at bedtime. FOR SEIZURES 02/19/24   Vicci Barnie NOVAK, MD  PHENYTOIN  INFATABS 50 MG tablet CHEW ONE-HALF TABLETS (25 MG TOTAL) BY MOUTH AT BEDTIME. 08/19/24   Vicci Barnie NOVAK, MD  rosuvastatin  (CRESTOR ) 10 MG tablet Take 10 mg by mouth daily.    [provider]  ursodiol  (ACTIGALL ) 500 MG tablet Take 500 mg by mouth 2 (two) times daily.    [provider]     Family History  Problem Relation Age of Onset   Pulmonary embolism Mother    Liver disease Father    Diabetes Sister    Hypertension Sister    Hypertension Sister    Hypertension Sister    Seizures Neg Hx     Social History   Socioeconomic History   Marital status: Divorced    Spouse name: Not on file   Number of children: 0   Years of education: 10   Highest education level: Not on file  Occupational History   Not on file  Tobacco Use   Smoking status: Former    Current packs/day: 0.00    Average packs/day: 3.0 packs/day    Types: Cigarettes    Quit date: 01/02/2014    Years since quitting: 10.8   Smokeless tobacco: Former  Building Services Engineer status: Never Used  Substance and Sexual Activity   Alcohol use: No    Comment: Former Alcoholic, quit at age of 51   Drug use: No   Sexual activity: Yes  Other Topics Concern   Not on file  Social History Narrative   ** Merged History Encounter **       Patient is single and lives with a friend and her son. Patient does not have any children. Patient is disabled. Patient has a 10 th grade education. Patient is left-handed. Patient  drinks some soda but not everyday.   Social Drivers of Health   Tobacco Use: Medium Risk (10/20/2024)   Patient History    Smoking Tobacco Use: Former    Smokeless Tobacco Use: Former    Passive Exposure: Not on Actuary Strain: Low Risk (06/25/2023)   Overall Financial Resource Strain (CARDIA)    Difficulty of Paying Living Expenses: Not hard at all  Food Insecurity: No Food Insecurity (06/24/2024)   Epic    Worried About Programme Researcher, Broadcasting/film/video in the Last Year: Never true    Ran Out of Food in the Last Year: Never true  Transportation Needs: No  Transportation Needs (06/24/2024)   Epic    Lack of Transportation (Medical): No    Lack of Transportation (Non-Medical): No  Physical Activity: Inactive (01/14/2024)   Exercise Vital Sign    Days of Exercise per Week: 0 days    Minutes of Exercise per Session: 0 min  Stress: No Stress Concern Present (06/25/2023)   [employer] of Occupational Health - Occupational Stress Questionnaire    Feeling of Stress : Not at all  Social Connections: Socially Isolated (06/01/2024)   Social Connection and Isolation Panel    Frequency of Communication with Friends and Family: Never    Frequency of Social Gatherings with Friends and Family: More than three times a week    Attends Religious Services: Never    Database Administrator or Organizations: No    Attends Banker Meetings: Never    Marital Status: Divorced  Depression (PHQ2-9): Low Risk (08/24/2024)   Depression (PHQ2-9)    PHQ-2 Score: 0  Alcohol Screen: Low Risk (06/25/2023)   Alcohol Screen    Last Alcohol Screening Score (AUDIT): 0  Housing: Unknown (06/24/2024)   Epic    Unable to Pay for Housing in the Last Year: No    Number of Times Moved in the Last Year: Not on file    Homeless in the Last Year: No  Utilities: Not At Risk (06/24/2024)   Epic    Threatened with loss of utilities: No  Health Literacy: Inadequate Health Literacy (01/14/2024)   B1300 Health  Literacy    Frequency of need for help with medical instructions: Always      Review of Systems: A 12 point ROS discussed and pertinent positives are indicated in the HPI above.  All other systems are negative.  Review of Systems  Constitutional:  Negative for fever.  HENT:  Negative for congestion.   Respiratory:  Negative for cough and shortness of breath.   Cardiovascular:  Negative for chest pain.  Gastrointestinal:  Negative for abdominal pain.  Neurological:  Negative for headaches.  Psychiatric/Behavioral:  Negative for behavioral problems and confusion.     Vital Signs: BP 117/75   Pulse 68   Temp (!) 96.8 F (36 C) (Temporal)   Resp 16   Ht 5' 9 (1.753 m)   Wt 168 lb (76.2 kg)   SpO2 97%   BMI 24.81 kg/m   Advance Care Plan: The advanced care plan/surrogate decision maker was discussed at the time of visit and the patient did not wish to discuss or was not able to name a surrogate decision maker or provide an advance care plan.    Physical Exam Vitals and nursing note reviewed.  Constitutional:      Appearance: He is well-developed.  HENT:     Head: Normocephalic.     Mouth/Throat:     Mouth: Mucous membranes are dry.  Cardiovascular:     Rate and Rhythm: Normal rate.  Pulmonary:     Effort: Pulmonary effort is normal.  Musculoskeletal:        General: Normal range of motion.     Cervical back: Normal range of motion.  Skin:    General: Skin is warm and dry.  Neurological:     Mental Status: He is alert and oriented to person, place, and time. Mental status is at baseline.     Imaging: XR Hand Complete Left Result Date: 10/06/2024 Stable 3rd and 4th proximal phalanx fractures   Labs:  CBC: Recent Labs  08/24/24 1017 09/16/24 0739 09/24/24 1150 10/20/24 0806  WBC 4.9 6.1 6.4 4.6  HGB 14.4 14.5 13.3 12.8*  HCT 39.4 41.5 37.7* 36.5*  PLT 165 196 239 146*    COAGS: Recent Labs    05/14/24 1208 09/16/24 0739 10/20/24 0806  INR  1.2* 1.0 1.1    BMP: Recent Labs    08/24/24 1017 09/16/24 0739 09/24/24 1150 10/20/24 0806  NA 137 138 137 136  K 4.1 4.9 4.2 4.1  CL 107 103 100 101  CO2 23 26 27 26   GLUCOSE 119* 99 108* 102*  BUN 16 16 11 13   CALCIUM  9.2 9.6 9.5 9.4  CREATININE 0.96 1.03 0.89 0.89  GFRNONAA >60 >60 >60 >60    LIVER FUNCTION TESTS: Recent Labs    08/24/24 1017 09/16/24 0739 09/24/24 1150 10/20/24 0806  BILITOT 0.5 0.3 0.5 0.4  AST 14* 18 33 15  ALT 11 8 29 7   ALKPHOS 215* 241* 204* 218*  PROT 7.8 8.1 8.0 7.4  ALBUMIN 4.2 4.4 3.9 4.0    TUMOR MARKERS: Recent Labs    05/14/24 1208  AFPTM 1.5  CEA <2.0  CA199 19    Assessment and Plan:  73 y.o. male outpatient. History of dementia, seizure disorder, hepatitis C, and primary biliary  cirrhosis complicated by hepatocellular carcinoma. Found to have a single 4 cm lesson in segment 8 of the liver. he liver lesion was found to be enlarging on Joshua Liver dated 11.4.25. Patient was deemed not to be a  transplant or surgical candidate  The patient was seen along with his nephew in IR clinic on 11.11.25. At that time [name] and his nephew  ( his medical power of attorney) decided to proceed with Neoadjuvant  treatment  of Deb TACE  and microwave ablation. On 11.27.25 IR performed a Deb Tace  with 50 mg of doxorubicin .   Patient presents for the second part of the procedure, the microwave ablation of the liver lesion in segment 8 with anesthesia.  PLAN: IR Image Guided Microwave Ablation of Liver Lesion in Segment 8. Procedure to be Performed under General Anesthesia   Risks and benefits discussed with the patient's sister including, but not limited to bleeding, infection, liver failure, bile duct injury, pneumothorax or damage to adjacent structures. All of the sister's questions were answered,Ms Quick  is agreeable to proceed.  Consent signed and in chart.   Thank you for this interesting consult.  I greatly enjoyed meeting [name] and look forward to participating in their care.  A copy of this report was sent to the requesting provider on this date.  Electronically Signed: Delon JAYSON Beagle, NP 10/20/2024, 8:53 AM   I spent a total of    25 Minutes in face to face in clinical consultation, greater than 50% of which was counseling/coordinating care for liver lesion ablation  "

## 2024-10-19 NOTE — Progress Notes (Signed)
 "  Office Visit Note   Patient: [name]           Date of Birth: 07-Apr-1951           MRN: [id number] Visit Date: 10/19/2024              Requested by: Jule Ronal CROME, PA-C [id number] [place]  [address] [place],  [place] [address] PCP: Cloria Annabella CROME, DO   Assessment & Plan: Visit Diagnoses:  1. Age-related osteoporosis without current pathological fracture     Plan: Patient is a pleasant 73 year old gentleman who presents with his nephew today.  He has a history of dementia.  He still ambulates normally with a cane.  He has sustained several fractures recently and has a history in the past of a hip fracture.  Was referred by Morna Jule for evaluation of osteoporosis.  He has never had a bone density scan before.  He has never taken any medication for osteoporosis.  No history of heart disease.  No history of cancers no history of kidney disease no history of gastric or peptic ulcer no history of reflux.  He does have a history of seizures though these are under good control.  He does not take calcium  or vitamin D .  He is not a smoker or drinker and does not do any exercise.  Does not have a history of hip or spine fracture in his parents and no history of dental issues.  I spent about 45 minutes reviewing his chart and talking to his nephew.  Because he is still ambulatory it would be nice to put him on something such as Prolia.  I do have concerns about seizures this was slightly but rarely lowers seizure threshold.  But first I would like to get a bone density as he has never had 1 of these before we will also draw a vitamin D  and an SPEP.  Reviewed side effects of medications we will discuss further once I get results back of his labs  Follow-Up Instructions: No follow-ups on file.   Orders:  No orders of the defined types were placed in this encounter.  No orders of the defined types were placed in this encounter.     Procedures: No procedures performed   Clinical Data: No  additional findings.   Subjective: Chief Complaint  Patient presents with   Other    Evaluation for possible osteoporosis management    HPI  Review of Systems  All other systems reviewed and are negative.    Objective: Vital Signs: There were no vitals taken for this visit.  Physical Exam Constitutional:      Appearance: Normal appearance.  Pulmonary:     Effort: Pulmonary effort is normal.  Skin:    General: Skin is warm and dry.  Neurological:     General: No focal deficit present.     Mental Status: He is alert and oriented to person, place, and time.     Ortho Exam  Specialty Comments:  No specialty comments available.  Imaging: No results found.   PMFS History: Patient Active Problem List   Diagnosis Date Noted   Age-related osteoporosis without current pathological fracture 10/19/2024   Hepatocellular carcinoma (HCC) 06/25/2024   Breakthrough seizure (HCC) 06/01/2024   Closed nondisplaced fracture of lateral malleolus of right fibula 06/01/2024   Community acquired pneumonia 01/10/2024   ARF (acute renal failure) 01/10/2024   GERD (gastroesophageal reflux disease) 01/10/2024   Pneumonia 01/10/2024   Thrombocytopenia 12/29/2023  Acute metabolic encephalopathy 12/29/2023   Liver mass 12/29/2023   Influenza A 12/29/2023   Seizure disorder (HCC) 12/29/2023   Dementia (HCC) 12/29/2023   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   Vascular dementia with other behavioral disturbance, unspecified dementia severity (HCC) 08/20/2023   Pain due to onychomycosis of toenails of both feet 02/28/2023   Sepsis (HCC) 02/23/2023   Barrett's esophagus 12/25/2021   Bell's palsy 08/23/2020   Elevated alkaline phosphatase level 11/17/2019   Adenomatous polyp of colon 09/02/2017   Functional urinary incontinence 09/02/2017   Hemiparesis and alteration of sensations as late effects of stroke (HCC) 01/25/2017   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/23/2015   Band keratopathy of both  eyes 03/22/2015   Hip fracture (HCC) 11/26/2013   break through seizure 11/26/2013   Memory disorder 03/04/2013   Depressive disorder, not elsewhere classified 07/24/2012   Abnormality of gait 07/24/2012   Focal epilepsy with impairment of consciousness (HCC) 07/24/2012   Cerebral thrombosis with cerebral infarction (HCC) 07/24/2012   Lesion of ulnar nerve 07/24/2012   Past Medical History:  Diagnosis Date   Chronic hepatitis C (HCC)    Dementia (HCC)    a.) on NMDA receptor antagonist (memantine )   Diverticulosis    Gait disorder    GERD (gastroesophageal reflux disease)    Gingival hypertrophy    Secondary to Dilantin    Hiatal hernia    History of alcoholism (HCC)    HLD (hyperlipidemia)    Hx of ischemic vertebrobasilar artery thalamic stroke (RIGHT)    Internal hemorrhoids    LEFT hemiparesis and alteration of sensations as late effects of stroke (HCC)    Long-term use of aspirin  therapy    Memory disorder 03/04/2013   Primary biliary cirrhosis (HCC)    Seizures (HCC)    a.) Tx'd with lacosamide  + levetiracetam  + phenytoin    Stroke (HCC)    Right frontal, right thalamic   Tubular adenoma of colon    Ulnar neuropathy of left upper extremity     Family History  Problem Relation Age of Onset   Pulmonary embolism Mother    Liver disease Father    Diabetes Sister    Hypertension Sister    Hypertension Sister    Hypertension Sister    Seizures Neg Hx     Past Surgical History:  Procedure Laterality Date   COLONOSCOPY WITH PROPOFOL  N/A 07/10/2017   Procedure: COLONOSCOPY WITH PROPOFOL ;  Surgeon: Elicia Claw, MD;  [facility];  Service: Gastroenterology;  Laterality: N/A;   ESOPHAGOGASTRODUODENOSCOPY (EGD) WITH PROPOFOL  N/A 07/10/2017   Procedure: ESOPHAGOGASTRODUODENOSCOPY (EGD) WITH PROPOFOL ;  Surgeon: Elicia Claw, MD;  [facility];  Service: Gastroenterology;  Laterality: N/A;   HIP ARTHROPLASTY Left 11/26/2013   Procedure: LEFT HIP  HEMIARTHROPLASTY;  Surgeon: [name], MD;  [facility];  Service: Orthopedics;  Laterality: Left;   IR EMBO TUMOR ORGAN ISCHEMIA INFARCT INC GUIDE ROADMAPPING  09/16/2024   IR RADIOLOGIST EVAL & MGMT  09/01/2024   WRIST SURGERY Left 95 & 96   Social History   Occupational History   Not on file  Tobacco Use   Smoking status: Former    Current packs/day: 0.00    Average packs/day: 3.0 packs/day    Types: Cigarettes    Quit date: 01/02/2014    Years since quitting: 10.8   Smokeless tobacco: Former  Building Services Engineer status: Never Used  Substance and Sexual Activity   Alcohol use: No    Comment: Former Alcoholic, quit  at age of 69   Drug use: No   Sexual activity: Yes        "

## 2024-10-20 ENCOUNTER — Ambulatory Visit: Payer: Self-pay | Admitting: Registered Nurse

## 2024-10-20 ENCOUNTER — Other Ambulatory Visit (HOSPITAL_COMMUNITY): Payer: Self-pay | Admitting: Radiology

## 2024-10-20 ENCOUNTER — Ambulatory Visit
Admission: RE | Admit: 2024-10-20 | Discharge: 2024-10-20 | Disposition: A | Discharge status: Home / Self Care | Payer: Medicare (Managed Care) | Source: Ambulatory Visit | Attending: Interventional Radiology | Admitting: Interventional Radiology

## 2024-10-20 ENCOUNTER — Other Ambulatory Visit: Payer: Self-pay

## 2024-10-20 ENCOUNTER — Encounter: Payer: Self-pay | Admitting: Registered Nurse

## 2024-10-20 DIAGNOSIS — Z01818 Encounter for other preprocedural examination: Secondary | ICD-10-CM | POA: Diagnosis present

## 2024-10-20 DIAGNOSIS — K743 Primary biliary cirrhosis: Secondary | ICD-10-CM | POA: Diagnosis not present

## 2024-10-20 DIAGNOSIS — Z8619 Personal history of other infectious and parasitic diseases: Secondary | ICD-10-CM | POA: Insufficient documentation

## 2024-10-20 DIAGNOSIS — C220 Liver cell carcinoma: Secondary | ICD-10-CM | POA: Diagnosis not present

## 2024-10-20 DIAGNOSIS — F0390 Unspecified dementia without behavioral disturbance: Secondary | ICD-10-CM | POA: Insufficient documentation

## 2024-10-20 DIAGNOSIS — G40909 Epilepsy, unspecified, not intractable, without status epilepticus: Secondary | ICD-10-CM | POA: Diagnosis not present

## 2024-10-20 DIAGNOSIS — R160 Hepatomegaly, not elsewhere classified: Secondary | ICD-10-CM

## 2024-10-20 LAB — CBC
HCT: 36.5 % — ABNORMAL LOW (ref 39.0–52.0)
Hemoglobin: 12.8 g/dL — ABNORMAL LOW (ref 13.0–17.0)
MCH: 32.2 pg (ref 26.0–34.0)
MCHC: 35.1 g/dL (ref 30.0–36.0)
MCV: 91.9 fL (ref 80.0–100.0)
Platelets: 146 K/uL — ABNORMAL LOW (ref 150–400)
RBC: 3.97 MIL/uL — ABNORMAL LOW (ref 4.22–5.81)
RDW: 11.9 % (ref 11.5–15.5)
WBC: 4.6 K/uL (ref 4.0–10.5)
nRBC: 0 % (ref 0.0–0.2)

## 2024-10-20 LAB — COMPREHENSIVE METABOLIC PANEL WITH GFR
ALT: 7 U/L (ref 0–44)
AST: 15 U/L (ref 15–41)
Albumin: 4 g/dL (ref 3.5–5.0)
Alkaline Phosphatase: 218 U/L — ABNORMAL HIGH (ref 38–126)
Anion gap: 10 (ref 5–15)
BUN: 13 mg/dL (ref 8–23)
CO2: 26 mmol/L (ref 22–32)
Calcium: 9.4 mg/dL (ref 8.9–10.3)
Chloride: 101 mmol/L (ref 98–111)
Creatinine, Ser: 0.89 mg/dL (ref 0.61–1.24)
GFR, Estimated: >60 mL/min
Glucose, Bld: 102 mg/dL — ABNORMAL HIGH (ref 70–99)
Potassium: 4.1 mmol/L (ref 3.5–5.1)
Sodium: 136 mmol/L (ref 135–145)
Total Bilirubin: 0.4 mg/dL (ref 0.0–1.2)
Total Protein: 7.4 g/dL (ref 6.5–8.1)

## 2024-10-20 LAB — PROTIME-INR
INR: 1.1 (ref 0.8–1.2)
Prothrombin Time: 14.6 s (ref 11.4–15.2)

## 2024-10-20 MED ORDER — SUGAMMADEX SODIUM 500 MG/5ML IV SOLN
INTRAVENOUS | Status: DC | PRN
  Administered 2024-10-20: 900 mg via INTRAVENOUS

## 2024-10-20 MED ORDER — PHENYLEPHRINE HCL-NACL 20-0.9 MG/250ML-% IV SOLN
INTRAVENOUS | Status: AC
  Filled 2024-10-20: qty 250

## 2024-10-20 MED ORDER — MOXIFLOXACIN HCL 400 MG PO TABS: 400.0000 mg | ORAL_TABLET | Freq: Every day | ORAL | 0 refills | Status: AC

## 2024-10-20 MED ORDER — OXYCODONE HCL 5 MG PO TABS: 5.0000 mg | ORAL_TABLET | Freq: Once | ORAL | Status: DC | PRN

## 2024-10-20 MED ORDER — FENTANYL CITRATE (PF) 100 MCG/2ML IJ SOLN
INTRAMUSCULAR | Status: DC | PRN
  Administered 2024-10-20 (×2): 50 ug via INTRAVENOUS

## 2024-10-20 MED ORDER — IOHEXOL 350 MG/ML SOLN
100.0000 mL | Freq: Once | INTRAVENOUS | Status: AC | PRN
  Administered 2024-10-20: 100 mL via INTRAVENOUS

## 2024-10-20 MED ORDER — CHLORHEXIDINE GLUCONATE CLOTH 2 % EX PADS
6.0000 | MEDICATED_PAD | Freq: Every day | CUTANEOUS | Status: DC
  Administered 2024-10-20: 6 via TOPICAL

## 2024-10-20 MED ORDER — FENTANYL CITRATE (PF) 50 MCG/ML IJ SOSY: 25.0000 ug | PREFILLED_SYRINGE | INTRAMUSCULAR | Status: DC | PRN

## 2024-10-20 MED ORDER — ARTIFICIAL TEARS OPHTHALMIC OINT
TOPICAL_OINTMENT | OPHTHALMIC | Status: AC
  Filled 2024-10-20: qty 3.5

## 2024-10-20 MED ORDER — GLYCOPYRROLATE 0.2 MG/ML IJ SOLN
INTRAMUSCULAR | Status: DC | PRN
  Administered 2024-10-20: .2 mg via INTRAVENOUS

## 2024-10-20 MED ORDER — ONDANSETRON HCL 4 MG/2ML IJ SOLN
INTRAMUSCULAR | Status: DC | PRN
  Administered 2024-10-20: 4 mg via INTRAVENOUS

## 2024-10-20 MED ORDER — EPHEDRINE SULFATE-NACL 50-0.9 MG/10ML-% IV SOSY
PREFILLED_SYRINGE | INTRAVENOUS | Status: DC | PRN
  Administered 2024-10-20: 5 mg via INTRAVENOUS

## 2024-10-20 MED ORDER — LIDOCAINE HCL (PF) 2 % IJ SOLN
INTRAMUSCULAR | Status: AC
  Filled 2024-10-20: qty 5

## 2024-10-20 MED ORDER — ONDANSETRON HCL 4 MG/2ML IJ SOLN
INTRAMUSCULAR | Status: AC
  Filled 2024-10-20: qty 2

## 2024-10-20 MED ORDER — SUGAMMADEX SODIUM 200 MG/2ML IV SOLN
INTRAVENOUS | Status: DC | PRN
  Administered 2024-10-20: 200 mg via INTRAVENOUS

## 2024-10-20 MED ORDER — DEXAMETHASONE SOD PHOSPHATE PF 10 MG/ML IJ SOLN
INTRAMUSCULAR | Status: DC | PRN
  Administered 2024-10-20: 10 mg via INTRAVENOUS

## 2024-10-20 MED ORDER — FENTANYL CITRATE (PF) 100 MCG/2ML IJ SOLN
INTRAMUSCULAR | Status: AC
  Filled 2024-10-20: qty 2

## 2024-10-20 MED ORDER — PROPOFOL 10 MG/ML IV BOLUS
INTRAVENOUS | Status: DC | PRN
  Administered 2024-10-20: 150 mg via INTRAVENOUS

## 2024-10-20 MED ORDER — OXYCODONE HCL 5 MG/5ML PO SOLN: 5.0000 mg | Freq: Once | ORAL | Status: DC | PRN

## 2024-10-20 MED ORDER — ROCURONIUM BROMIDE 100 MG/10ML IV SOLN
INTRAVENOUS | Status: DC | PRN
  Administered 2024-10-20: 50 mg via INTRAVENOUS
  Administered 2024-10-20 (×2): 20 mg via INTRAVENOUS

## 2024-10-20 MED ORDER — LIDOCAINE HCL (CARDIAC) PF 100 MG/5ML IV SOSY
PREFILLED_SYRINGE | INTRAVENOUS | Status: DC | PRN
  Administered 2024-10-20: 100 mg via INTRAVENOUS

## 2024-10-20 MED ORDER — GLYCOPYRROLATE 0.2 MG/ML IJ SOLN
INTRAMUSCULAR | Status: AC
  Filled 2024-10-20: qty 1

## 2024-10-20 MED ORDER — SODIUM CHLORIDE 0.9 % IV SOLN
2.0000 g | INTRAVENOUS | Status: AC
  Administered 2024-10-20: 2 g via INTRAVENOUS
  Filled 2024-10-20: qty 2

## 2024-10-20 MED ORDER — PROPOFOL 10 MG/ML IV BOLUS
INTRAVENOUS | Status: AC
  Filled 2024-10-20: qty 40

## 2024-10-20 MED ORDER — LACTATED RINGERS IV SOLN: INTRAVENOUS | Status: DC

## 2024-10-20 NOTE — Progress Notes (Signed)
 Patient clinically stable post CT Liver Microwave ablation per Dr Karalee, tolerated well with GA, extubated post procedure per CRNA and transported to PACU with update given to Rn caring for patient post procedure.

## 2024-10-20 NOTE — Anesthesia Preprocedure Evaluation (Addendum)
 "                                  Anesthesia Evaluation  Patient identified by MRN, date of birth, ID band Patient confused    Reviewed: Allergy & Precautions, NPO status , Patient's Chart, lab work & pertinent test results  Airway Mallampati: II  TM Distance: >3 FB Neck ROM: full    Dental  (+) Chipped   Pulmonary former smoker   Pulmonary exam normal breath sounds clear to auscultation       Cardiovascular hypertension, Pt. on medications and Pt. on home beta blockers + Peripheral Vascular Disease and +CHF  Normal cardiovascular exam Rhythm:Regular Rate:Normal     Neuro/Psych Seizures -,  PSYCHIATRIC DISORDERS  Depression   Dementia CVA    GI/Hepatic negative GI ROS, Neg liver ROS,GERD  ,,(+) Hepatitis -, C  Endo/Other    Renal/GU Renal disease     Musculoskeletal   Abdominal   Peds  Hematology negative hematology ROS (+)   Anesthesia Other Findings Past Medical History: No date: Chronic hepatitis C (HCC) No date: Dementia (HCC)     Comment:  a.) on NMDA receptor antagonist (memantine ) No date: Diverticulosis No date: Gait disorder No date: GERD (gastroesophageal reflux disease) No date: Gingival hypertrophy     Comment:  Secondary to Dilantin  No date: Hiatal hernia No date: History of alcoholism (HCC) No date: HLD (hyperlipidemia) No date: Hx of ischemic vertebrobasilar artery thalamic stroke (RIGHT) No date: Internal hemorrhoids No date: LEFT hemiparesis and alteration of sensations as late  effects of stroke (HCC) No date: Long-term use of aspirin  therapy 03/04/2013: Memory disorder No date: Primary biliary cirrhosis (HCC) No date: Seizures (HCC)     Comment:  a.) Tx'd with lacosamide  + levetiracetam  + phenytoin  No date: Stroke [facility])     Comment:  Right frontal, right thalamic No date: Tubular adenoma of colon No date: Ulnar neuropathy of left upper extremity  Past Surgical History: 07/10/2017: COLONOSCOPY WITH PROPOFOL ; N/A      Comment:  Procedure: COLONOSCOPY WITH PROPOFOL ;  Surgeon:               Elicia Claw, MD;  [facility];  Service:              Gastroenterology;  Laterality: N/A; 07/10/2017: ESOPHAGOGASTRODUODENOSCOPY (EGD) WITH PROPOFOL ; N/A     Comment:  Procedure: ESOPHAGOGASTRODUODENOSCOPY (EGD) WITH               PROPOFOL ;  Surgeon: Elicia Claw, MD;  [facility];  Service: Gastroenterology;  Laterality: N/A; 11/26/2013: HIP ARTHROPLASTY; Left     Comment:  Procedure: LEFT HIP HEMIARTHROPLASTY;  Surgeon:               [name], MD;  Location: [facility] OR;  Service:               Orthopedics;  Laterality: Left; 09/16/2024: IR EMBO TUMOR ORGAN ISCHEMIA INFARCT INC GUIDE ROADMAPPING 09/01/2024: IR RADIOLOGIST EVAL & MGMT 95 & 96: WRIST SURGERY; Left  BMI    Body Mass Index: 24.81 kg/m      Reproductive/Obstetrics negative OB ROS                              Anesthesia Physical Anesthesia Plan  ASA: 3  Anesthesia Plan: General ETT   Post-op Pain Management:    Induction: Intravenous  PONV Risk Score and Plan: 2 and Ondansetron  and Dexamethasone   Airway Management Planned: Oral ETT  Additional Equipment:   Intra-op Plan:   Post-operative Plan: Extubation in OR  Informed Consent: I have reviewed the patients History and Physical, chart, labs and discussed the procedure including the risks, benefits and alternatives for the proposed anesthesia with the patient or authorized representative who has indicated his/her understanding and acceptance.     Dental Advisory Given  Plan Discussed with: Anesthesiologist, CRNA and Surgeon  Anesthesia Plan Comments: (Patient's siter consented for risks of anesthesia including but not limited to:  - adverse reactions to medications - damage to eyes, teeth, lips or other oral mucosa - nerve damage due to positioning  - sore throat or hoarseness - Damage to heart, brain,  nerves, lungs, other parts of body or loss of life  Patient's sister voiced understanding and assent.)         Anesthesia Quick Evaluation  "

## 2024-10-20 NOTE — Discharge Instructions (Signed)
 Removed dressing right outside abdomen

## 2024-10-20 NOTE — Anesthesia Procedure Notes (Signed)
 Procedure Name: Intubation Date/Time: 10/20/2024 8:59 AM  Performed by: Satira Johnson, CRNAPre-anesthesia Checklist: Patient identified, Suction available, Patient being monitored, Timeout performed and Emergency Drugs available Patient Re-evaluated:Patient Re-evaluated prior to induction Oxygen Delivery Method: Circle system utilized Preoxygenation: Pre-oxygenation with 100% oxygen Induction Type: IV induction Ventilation: Mask ventilation without difficulty Laryngoscope Size: McGrath and 4 Grade View: Grade I Tube type: Oral Tube size: 7.5 mm Number of attempts: 1 Airway Equipment and Method: Stylet Placement Confirmation: positive ETCO2, breath sounds checked- equal and bilateral, CO2 detector and ETT inserted through vocal cords under direct vision Secured at: 20 cm Tube secured with: Tape

## 2024-10-20 NOTE — OR Nursing (Signed)
 Dr Delfina assessed pt and oked discharge, antibiotic electronically sent to pharmacy, sister aware.

## 2024-10-20 NOTE — Anesthesia Postprocedure Evaluation (Signed)
"   Anesthesia Post Note  Patient: [name]  Procedure(s) Performed: CT LIVER TUMOR ABLATION RFA [facility]  Patient location during evaluation: PACU Anesthesia Type: General Level of consciousness: awake and alert Pain management: pain level controlled Vital Signs Assessment: post-procedure vital signs reviewed and stable Respiratory status: spontaneous breathing, nonlabored ventilation, respiratory function stable and patient connected to nasal cannula oxygen Cardiovascular status: blood pressure returned to baseline and stable Postop Assessment: no apparent nausea or vomiting Anesthetic complications: no   No notable events documented.   Last Vitals:  Vitals:   10/20/24 1230 10/20/24 1300  BP: (!) 147/83 (!) 140/81  Pulse: 75 75  Resp: 19 10  Temp:  (!) 35.3 C  SpO2: 94% 95%    Last Pain:  Vitals:   10/20/24 1300  TempSrc: Temporal  PainSc: 0-No pain                 [facility]      "

## 2024-10-20 NOTE — Addendum Note (Signed)
 Addendum  created 10/20/24 1341 by Satira Johnson, CRNA   Attestation recorded in [place], [employer] filed

## 2024-10-21 LAB — PROTEIN ELECTROPHORESIS, SERUM
Albumin ELP: 4 g/dL (ref 3.8–4.8)
Alpha 1: 0.4 g/dL — ABNORMAL HIGH (ref 0.2–0.3)
Alpha 2: 1 g/dL — ABNORMAL HIGH (ref 0.5–0.9)
Beta 2: 0.7 g/dL — ABNORMAL HIGH (ref 0.2–0.5)
Beta Globulin: 0.5 g/dL (ref 0.4–0.6)
Gamma Globulin: 1.4 g/dL (ref 0.8–1.7)
Total Protein: 7.9 g/dL (ref 6.1–8.1)

## 2024-10-21 LAB — VITAMIN D 25 HYDROXY (VIT D DEFICIENCY, FRACTURES): Vit D, 25-Hydroxy: 111 ng/mL — ABNORMAL HIGH (ref 30–100)

## 2024-10-30 ENCOUNTER — Encounter: Payer: Self-pay | Admitting: Neurology

## 2024-10-30 ENCOUNTER — Ambulatory Visit: Payer: Medicare (Managed Care) | Admitting: Neurology

## 2024-10-30 VITALS — BP 103/65 | HR 80 | Ht 67.0 in | Wt 160.5 lb

## 2024-10-30 DIAGNOSIS — F03B18 Unspecified dementia, moderate, with other behavioral disturbance: Secondary | ICD-10-CM

## 2024-10-30 DIAGNOSIS — Z5181 Encounter for therapeutic drug level monitoring: Secondary | ICD-10-CM | POA: Diagnosis not present

## 2024-10-30 DIAGNOSIS — G40009 Localization-related (focal) (partial) idiopathic epilepsy and epileptic syndromes with seizures of localized onset, not intractable, without status epilepticus: Secondary | ICD-10-CM

## 2024-10-30 NOTE — Progress Notes (Signed)
 "   PATIENT: [name] DOB: 1951-02-01  REASON FOR VISIT: follow up HISTORY FROM: patient  HISTORY OF PRESENT ILLNESS: Today 10/30/2024 Patient presents today for follow up. She is accompanied by her sister. Last visit was a year ago, since then he has been having breakthrough seizures, a total of 6 or 7 ED visits for seizures. ASM medications have been low. Sister ensures me that she gives him his medications. They usually come in a pill box. Sister tells me she is not exactly what is the dosage but she gives him all the meds.    INTERVAL HISTORY 11/22/2023 Patient presents today for follow-up, he is accompanied by transport but nephew was available on the phone.  Last visit was in November, at that time plan was to continue his current antiseizure medications and Namenda  for his dementia.  He did however had a breakthrough seizure, was taken to the hospital and Vimpat  was added since the addition of Vimpat  he has been doing well denies any seizure or seizure-like activity.  He is currently on Vimpat , Dilantin  and Keppra  tolerating the combination very well.  They deny any fall, stated his memory is poor but this is baseline.  No other complaint or concerns.   INTERVAL HISTORY 09/10/2023 Patient presents today for follow-up, he is accompanied by nephew. Last visit was in January 2023.  At that time, plan was to continue his levetiracetam  and phenytoin  and also Namenda  for his dementia.  He comes today with his nephew after a breakthrough seizure on November 10.  Nephew tells me at that time he was compliant with his medication, his antiseizure medication level were taken at the hospital and were normal.  They deny any provoking factors such as lack of sleep, no infection and did not miss his medication.  Since then, he has been doing well, he has not had any additional event.   In terms of his memory, it is getting worse.  He lives at home with his nephew who takes care of him.  He is able to  bathe himself, dress himself and feed himself otherwise needs assistance in all other ADLs.  INTERVAL HISTORY 11/21/2021 Patient presents today for follow-up, he is accompanied by his sister who lives with patient and his niece.  He has been doing well since last visit, no seizures.  Actually, the last seizure was more than 2 years ago.  He is compliant with his medications.  No recent falls.  Sister reports issue with his dementia, he usually wakes up early in the morning and start wandering, at times he has walk outside the house required sister to call 911 to bring him back.  He is able to bathe himself, clean himself, feed himself but other than that he depends on sister.  No other complaint.  Last phenytoin  level was 13.3.   Update 01/23/2021 SS: [name] is a 74 year old male with history of seizures and memory disorder, left hemiparesis.  On Dilantin  and Keppra .  He had left-sided Bell's palsy in November 2021.  No recent seizures.  Left-sided Bell's palsy has resolved.  Can now completely close the left eye, with smiling very mild left eye closure, unsure if baseline or not.  Continues to live with his sister, at times does not want to take a shower.  He does well to take his medications, his sister manages these.  No falls, uses a walker.  They have no complaints today.  He is here today for follow-up accompanied by  his sister, Sonny.  Update 08/23/2020 SS:[name] is a 74 year old male history of seizures and memory disorder.  He is on Dilantin  and Keppra .  He lives with his sister, uses a walker.  Presented to the ER 07/25/20 with left ptosis, left-sided asymmetric smile, inability to raise his left forehead.  MRI of the brain was negative for acute findings, diagnosed with left-sided Bell's palsy, was treated with Valtrex  and prednisone , given onset of Bell's palsy within 24 hours.  Was unable to fully close his left eye.  CMP showed mildly elevated alk phos 137, glucose 106.  He is about 75%  better, able to close the eye about 85%, still left-sided asymmetric smile, left eye closure weakness.  No drooling. Has been taping his eye at night.  No seizures reported, tolerating medications.  No falls.  No history of DM.  Presents today for evaluation accompanied by his sister.  HISTORY 02/18/2020 SS: [name] is a 74 year old male with history of seizures and memory disorder.  He was started on Dilantin  around April 2020 after reported seizures (frequent staring spells every 2 weeks, 1 episode lying down, his whole body drew up).  He has been established at a good maintenance dose of Dilantin  225 mg daily, along with Keppra  1500 mg daily.  Since last seen, his sister reports 1-2 episodes of brief staring episodes, unsure exactly if these represent true seizures.  He lives with his sister, Sonny.  She manages his medications and helps with his care.  He has a left hemiparesis.  He uses a walker.  He has seen GI for elevated alkaline phosphatase, in the setting of a positive antimitochondrial antibody and anti-smooth muscle antibody, undergoing evaluation. Is overall doing well with medications, is overall pleased, no recent falls.   REVIEW OF SYSTEMS: Out of a complete 14 system review of symptoms, the patient complains only of the following symptoms, and all other reviewed systems are negative.  Seizure  ALLERGIES: No Known Allergies  HOME MEDICATIONS: Outpatient Medications Prior to Visit  Medication Sig Dispense Refill   aspirin  EC 81 MG tablet Take 1 tablet (81 mg total) by mouth daily. 100 tablet 0   Lacosamide  150 MG TABS Take 1 tablet (150 mg total) by mouth 2 (two) times daily. 60 tablet 0   levETIRAcetam  (KEPPRA ) 750 MG tablet Take 1,500 mg by mouth 2 (two) times daily.     loratadine  (CLARITIN ) 10 MG tablet Take 10 mg by mouth daily.     memantine  (NAMENDA ) 10 MG tablet TAKE 1 TABLET BY MOUTH TWICE A DAY 60 tablet 0   ondansetron  (ZOFRAN -ODT) 4 MG disintegrating tablet Take 1  tablet (4 mg total) by mouth every 8 (eight) hours as needed for up to 12 doses for nausea or vomiting. 12 tablet 0   pantoprazole  (PROTONIX ) 40 MG tablet TAKE 1 TABLET BY MOUTH EVERY DAY 30 tablet 0   phenytoin  (DILANTIN ) 100 MG ER capsule Take 2 capsules (200 mg total) by mouth at bedtime. FOR SEIZURES 180 capsule 2   PHENYTOIN  INFATABS 50 MG tablet CHEW ONE-HALF TABLETS (25 MG TOTAL) BY MOUTH AT BEDTIME. 45 tablet 1   rosuvastatin  (CRESTOR ) 10 MG tablet Take 10 mg by mouth daily.     ursodiol  (ACTIGALL ) 500 MG tablet Take 500 mg by mouth 2 (two) times daily.     Lacosamide  100 MG TABS Take 1 tablet (100 mg total) by mouth in the morning and at bedtime. 180 tablet 3   No facility-administered medications prior  to visit.    PAST MEDICAL HISTORY: Past Medical History:  Diagnosis Date   Chronic hepatitis C (HCC)    Dementia (HCC)    a.) on NMDA receptor antagonist (memantine )   Diverticulosis    Gait disorder    GERD (gastroesophageal reflux disease)    Gingival hypertrophy    Secondary to Dilantin    Hiatal hernia    History of alcoholism (HCC)    HLD (hyperlipidemia)    Hx of ischemic vertebrobasilar artery thalamic stroke (RIGHT)    Internal hemorrhoids    LEFT hemiparesis and alteration of sensations as late effects of stroke (HCC)    Long-term use of aspirin  therapy    Memory disorder 03/04/2013   Primary biliary cirrhosis (HCC)    Seizures (HCC)    a.) Tx'd with lacosamide  + levetiracetam  + phenytoin    Stroke (HCC)    Right frontal, right thalamic   Tubular adenoma of colon    Ulnar neuropathy of left upper extremity     PAST SURGICAL HISTORY: Past Surgical History:  Procedure Laterality Date   COLONOSCOPY WITH PROPOFOL  N/A 07/10/2017   Procedure: COLONOSCOPY WITH PROPOFOL ;  Surgeon: Elicia Claw, MD;  Location: [facility];  Service: Gastroenterology;  Laterality: N/A;   ESOPHAGOGASTRODUODENOSCOPY (EGD) WITH PROPOFOL  N/A 07/10/2017   Procedure:  ESOPHAGOGASTRODUODENOSCOPY (EGD) WITH PROPOFOL ;  Surgeon: Elicia Claw, MD;  Location: [facility] ENDOS[facility];  Service: Gastroenterology;  Laterality: N/A;   HIP ARTHROPLASTY Left 11/26/2013   Procedure: LEFT HIP HEMIARTHROPLASTY;  Surgeon: [name], MD;  Location: [facility] OR;  Service: Orthopedics;  Laterality: Left;   IR EMBO TUMOR ORGAN ISCHEMIA INFARCT INC GUIDE ROADMAPPING  09/16/2024   IR RADIOLOGIST EVAL & MGMT  09/01/2024   WRIST SURGERY Left 95 & 96    FAMILY HISTORY: Family History  Problem Relation Age of Onset   Pulmonary embolism Mother    Liver disease Father    Diabetes Sister    Hypertension Sister    Hypertension Sister    Hypertension Sister    Seizures Neg Hx     SOCIAL HISTORY: Social History   Socioeconomic History   Marital status: Divorced    Spouse name: Not on file   Number of children: 0   Years of education: 10   Highest education level: Not on file  Occupational History   Not on file  Tobacco Use   Smoking status: Former    Current packs/day: 0.00    Average packs/day: 3.0 packs/day    Types: Cigarettes    Quit date: 01/02/2014    Years since quitting: 10.8   Smokeless tobacco: Former  Building Services Engineer status: Never Used  Substance and Sexual Activity   Alcohol use: No    Comment: Former Alcoholic, quit at age of 74   Drug use: No   Sexual activity: Yes  Other Topics Concern   Not on file  Social History Narrative   ** Merged History Encounter **       Patient is single and lives with a friend and her son. Patient does not have any children. Patient is disabled. Patient has a 10 th grade education. Patient is left-handed. Patient drinks some soda but not everyday.   Social Drivers of Health   Tobacco Use: Medium Risk (10/30/2024)   Patient History    Smoking Tobacco Use: Former    Smokeless Tobacco Use: Former    Passive Exposure: Not on Actuary Strain: Low Risk (06/25/2023)   Overall Financial  Resource  Strain (CARDIA)    Difficulty of Paying Living Expenses: Not hard at all  Food Insecurity: No Food Insecurity (06/24/2024)   Epic    Worried About Programme Researcher, Broadcasting/film/video in the Last Year: Never true    Ran Out of Food in the Last Year: Never true  Transportation Needs: No Transportation Needs (06/24/2024)   Epic    Lack of Transportation (Medical): No    Lack of Transportation (Non-Medical): No  Physical Activity: Inactive (01/14/2024)   Exercise Vital Sign    Days of Exercise per Week: 0 days    Minutes of Exercise per Session: 0 min  Stress: No Stress Concern Present (06/25/2023)   [employer] of Occupational Health - Occupational Stress Questionnaire    Feeling of Stress : Not at all  Social Connections: Socially Isolated (06/01/2024)   Social Connection and Isolation Panel    Frequency of Communication with Friends and Family: Never    Frequency of Social Gatherings with Friends and Family: More than three times a week    Attends Religious Services: Never    Database Administrator or Organizations: No    Attends Banker Meetings: Never    Marital Status: Divorced  Catering Manager Violence: Not At Risk (06/24/2024)   Epic    Fear of Current or Ex-Partner: No    Emotionally Abused: No    Physically Abused: No    Sexually Abused: No  Depression (PHQ2-9): Low Risk (08/24/2024)   Depression (PHQ2-9)    PHQ-2 Score: 0  Alcohol Screen: Low Risk (06/25/2023)   Alcohol Screen    Last Alcohol Screening Score (AUDIT): 0  Housing: Unknown (06/24/2024)   Epic    Unable to Pay for Housing in the Last Year: No    Number of Times Moved in the Last Year: Not on file    Homeless in the Last Year: No  Utilities: Not At Risk (06/24/2024)   Epic    Threatened with loss of utilities: No  Health Literacy: Inadequate Health Literacy (01/14/2024)   B1300 Health Literacy    Frequency of need for help with medical instructions: Always    PHYSICAL EXAM  Vitals:   10/30/24 1045  BP:  103/65  Pulse: 80  Weight: 160 lb 8 oz (72.8 kg)  Height: 5' 7 (1.702 m)    Body mass index is 25.14 kg/m.  Generalized: Well developed, in no acute distress     09/10/2023    9:29 AM 11/21/2021   10:02 AM 06/29/2021    3:41 PM  MMSE - Mini Mental State Exam  Not completed:  Unable to complete   Orientation to time 0  2  Orientation to Place 4  2  Registration 3  2  Attention/ Calculation 0  3  Recall 0  0  Language- name 2 objects 2  2  Language- repeat 0  1  Language- follow 3 step command 1  3  Language- read & follow direction 0  0  Write a sentence 0  0  Copy design 0  0  Total score 10  15    Neurological examination  Mentation: Alert, limited verbal response, most history is provided by his sister, follows most exam commands well, is well-appearing and groomed Cranial nerve II-XII:  Extraocular movements were full, visual field were full on confrontational test.  Motor: Good strength of all extremities, mild spasticity left upper extremity Sensory: Normal to right and left side Coordination: Mild dysmetria on  the left finger-nose-finger Gait and station: Rises from seated position with pushoff, ambulates with a cane, is wide-based, limp on the left Reflexes: Deep tendon reflexes are symmetric but decreased throughout   DIAGNOSTIC DATA (LABS, IMAGING, TESTING) - I reviewed patient records, labs, notes, testing and imaging myself where available.  Lab Results  Component Value Date   WBC 4.6 10/20/2024   HGB 12.8 (L) 10/20/2024   HCT 36.5 (L) 10/20/2024   MCV 91.9 10/20/2024   PLT 146 (L) 10/20/2024      Component Value Date/Time   NA 136 10/20/2024 0806   NA 143 08/20/2023 1539   K 4.1 10/20/2024 0806   CL 101 10/20/2024 0806   CO2 26 10/20/2024 0806   GLUCOSE 102 (H) 10/20/2024 0806   BUN 13 10/20/2024 0806   BUN 14 08/20/2023 1539   CREATININE 0.89 10/20/2024 0806   CREATININE 0.96 08/24/2024 1017   CREATININE 0.93 04/08/2017 1642   CALCIUM  9.4  10/20/2024 0806   PROT 7.4 10/20/2024 0806   PROT 7.4 08/20/2023 1539   ALBUMIN 4.0 10/20/2024 0806   ALBUMIN 4.4 08/20/2023 1539   AST 15 10/20/2024 0806   AST 14 (L) 08/24/2024 1017   ALT 7 10/20/2024 0806   ALT 11 08/24/2024 1017   ALKPHOS 218 (H) 10/20/2024 0806   BILITOT 0.4 10/20/2024 0806   BILITOT 0.5 08/24/2024 1017   GFRNONAA >60 10/20/2024 0806   GFRNONAA >60 08/24/2024 1017   GFRNONAA 86 04/08/2017 1642   GFRAA >60 07/25/2020 2138   GFRAA >89 04/08/2017 1642   Lab Results  Component Value Date   CHOL 122 08/20/2023   HDL 32 (L) 08/20/2023   LDLCALC 58 08/20/2023   TRIG 196 (H) 08/20/2023   CHOLHDL 3.8 08/20/2023   Lab Results  Component Value Date   HGBA1C 5.3 05/23/2016   No results found for: VITAMINB12 Lab Results  Component Value Date   TSH 1.270 10/26/2021   ASSESSMENT AND PLAN 74 y.o. year old male  has a past medical history of Chronic hepatitis C (HCC), Dementia (HCC), Diverticulosis, Gait disorder, GERD (gastroesophageal reflux disease), Gingival hypertrophy, Hiatal hernia, History of alcoholism (HCC), HLD (hyperlipidemia), Hx of ischemic vertebrobasilar artery thalamic stroke (RIGHT), Internal hemorrhoids, LEFT hemiparesis and alteration of sensations as late effects of stroke (HCC), Long-term use of aspirin  therapy, Memory disorder (03/04/2013), Primary biliary cirrhosis (HCC), Seizures (HCC), Stroke (HCC), Tubular adenoma of colon, and Ulnar neuropathy of left upper extremity. here with:  1.  Seizure disorder 2.  Cerebrovascular disease, left hemiparesis 3.  Left-sided Bell's palsy (resolved) 4.  Gait disorder 5.  Moderate to Severe Dementia   -Continue Keppra  750 mg tablet, 2 tablets twice a day -Continue Dilantin  total of 250 mg at bedtime  -Continue with Vimpat  150 mg twice daily, refill given  -Continue with Namenda   -Check routine labs today  -Sister to bring pill box Monday for verification -Follow-up in 6 months or sooner if needed,  call for seizure activity   Pastor Falling, MD 10/30/2024, 11:36 AM [facility] [address] [place], [place] [address] [phone]   "

## 2024-10-30 NOTE — Patient Instructions (Addendum)
 Continue current antiseizure medications  Levetiracetam  1500 mg twice daily   Lacosamide  150 mg twice daily   Dilantin  250 mg nightly  Will do blood work today Continue your other mediations Return in 6 months or sooner if worse

## 2024-11-03 ENCOUNTER — Ambulatory Visit: Payer: Medicare (Managed Care) | Admitting: Nurse Practitioner

## 2024-11-04 LAB — LACOSAMIDE: Lacosamide: 6.7 ug/mL (ref 5.0–10.0)

## 2024-11-04 LAB — PHENYTOIN LEVEL, TOTAL: Phenytoin (Dilantin), Serum: 15.7 ug/mL (ref 10.0–20.0)

## 2024-11-04 LAB — LEVETIRACETAM LEVEL: Levetiracetam Lvl: 50.1 ug/mL — AB (ref 10.0–40.0)

## 2024-11-16 ENCOUNTER — Ambulatory Visit: Payer: Self-pay | Admitting: Neurology

## 2024-11-16 NOTE — Progress Notes (Signed)
 Please call and advise the patient that the recent labs we checked were within normal limits. No further action is required on these tests at this time. Please remind patient to keep any upcoming appointments or tests and to call us  with any interim questions, concerns, problems or updates. Thanks,   Pastor Falling, MD

## 2024-11-17 NOTE — Progress Notes (Signed)
 Called the patient and left a message requesting a return call.

## 2024-11-19 NOTE — Telephone Encounter (Signed)
 Called and left detailed message with results, OK per DPR. Left office number for pt to call back if there were any questions.

## 2024-11-20 NOTE — Assessment & Plan Note (Addendum)
 [id number], stage I -Diagnosed in June 2025, MRI showed 3.2 cm mass in right hepatic lobe with segment 4A , consistent with HCC.  Patient is asymptomatic.   -Patient has been seen by radiation oncologist Dr. Dewey, he is a good candidate for SBRT treatment or liver ablation -He finally had liver ablation on 10/09/2024

## 2024-11-24 ENCOUNTER — Inpatient Hospital Stay: Payer: Medicare (Managed Care) | Admitting: Hematology

## 2024-11-24 ENCOUNTER — Inpatient Hospital Stay: Payer: Medicare (Managed Care) | Attending: Hematology

## 2024-11-24 ENCOUNTER — Ambulatory Visit: Payer: Self-pay

## 2024-11-24 ENCOUNTER — Ambulatory Visit: Payer: Self-pay | Admitting: Family Medicine

## 2024-11-24 VITALS — BP 134/75

## 2024-11-24 DIAGNOSIS — C220 Liver cell carcinoma: Secondary | ICD-10-CM

## 2024-11-24 LAB — CMP (CANCER CENTER ONLY)
ALT: 7 U/L (ref 0–44)
AST: 14 U/L — ABNORMAL LOW (ref 15–41)
Albumin: 4.2 g/dL (ref 3.5–5.0)
Alkaline Phosphatase: 220 U/L — ABNORMAL HIGH (ref 38–126)
Anion gap: 10 (ref 5–15)
BUN: 13 mg/dL (ref 8–23)
CO2: 27 mmol/L (ref 22–32)
Calcium: 9.3 mg/dL (ref 8.9–10.3)
Chloride: 101 mmol/L (ref 98–111)
Creatinine: 0.95 mg/dL (ref 0.61–1.24)
GFR, Estimated: >60 mL/min
Glucose, Bld: 114 mg/dL — ABNORMAL HIGH (ref 70–99)
Potassium: 4.3 mmol/L (ref 3.5–5.1)
Sodium: 138 mmol/L (ref 135–145)
Total Bilirubin: 0.3 mg/dL (ref 0.0–1.2)
Total Protein: 7.8 g/dL (ref 6.5–8.1)

## 2024-11-24 LAB — CBC WITH DIFFERENTIAL (CANCER CENTER ONLY)
Abs Immature Granulocytes: 0.01 10*3/uL (ref 0.00–0.07)
Basophils Absolute: 0 10*3/uL (ref 0.0–0.1)
Basophils Relative: 1 %
Eosinophils Absolute: 0.1 10*3/uL (ref 0.0–0.5)
Eosinophils Relative: 3 %
HCT: 38.1 % — ABNORMAL LOW (ref 39.0–52.0)
Hemoglobin: 13.4 g/dL (ref 13.0–17.0)
Immature Granulocytes: 0 %
Lymphocytes Relative: 24 %
Lymphs Abs: 1.1 10*3/uL (ref 0.7–4.0)
MCH: 31.9 pg (ref 26.0–34.0)
MCHC: 35.2 g/dL (ref 30.0–36.0)
MCV: 90.7 fL (ref 80.0–100.0)
Monocytes Absolute: 0.5 10*3/uL (ref 0.1–1.0)
Monocytes Relative: 12 %
Neutro Abs: 2.7 10*3/uL (ref 1.7–7.7)
Neutrophils Relative %: 60 %
Platelet Count: 141 10*3/uL — ABNORMAL LOW (ref 150–400)
RBC: 4.2 MIL/uL — ABNORMAL LOW (ref 4.22–5.81)
RDW: 12.2 % (ref 11.5–15.5)
WBC Count: 4.4 10*3/uL (ref 4.0–10.5)
nRBC: 0 % (ref 0.0–0.2)

## 2024-11-24 NOTE — Telephone Encounter (Signed)
 FYI Only or Action Required?: FYI only for provider: appointment scheduled on 2/3.  Patient was last seen in primary care on 03/31/2024 by [name], [name], MD.  Called Nurse Triage reporting Dizziness.  Symptoms began x 2 weeks.  Interventions attempted: Rest, hydration, or home remedies.  Symptoms are: gradually worsening.  Triage Disposition: See PCP When Office is Open (Within 3 Days)  Patient/caregiver understands and will follow disposition?: Yes     Message from [facility] sent at 11/24/2024 10:19 AM EST  Summary: dizzy spells lating 10 min   Reason for Triage: dizzy spells lasting 10 min  going on for a while getting worse      Reason for Disposition  [1] MILD dizziness (e.g., walking normally) AND [2] has NOT been evaluated by doctor (or NP/PA) for this  (Exception: Dizziness caused by heat exposure, sudden standing, or poor fluid intake.)  Answer Assessment - Initial Assessment Questions 1. DESCRIPTION: Describe your dizziness.     Dizzy spells lasting 10 minutes, happening more frequently.   2. LIGHTHEADED: Do you feel lightheaded? (e.g., somewhat faint, woozy, weak upon standing)     No   3. VERTIGO: Do you feel like either you or the room is spinning or tilting? (i.e., vertigo)     Yes   4. SEVERITY: How bad is it?  Do you feel like you are going to faint? Can you stand and walk?     Mild   5. ONSET:  When did the dizziness begin?     X 2 weeks   6. AGGRAVATING FACTORS: Does anything make it worse? (e.g., standing, change in head position)     Change in position   8. CAUSE: What do you think is causing the dizziness? (e.g., decreased fluids or food, diarrhea, emotional distress, heat exposure, new medicine, sudden standing, vomiting; unknown)     Unsure   9. RECURRENT SYMPTOM: Have you had dizziness before? If Yes, ask: When was the last time? What happened that time?     No   10. OTHER SYMPTOMS: Do you have any other  symptoms? (e.g., fever, chest pain, vomiting, diarrhea, bleeding)       No     Patient's nephew called in to triage with complaints of dizziness. This has been ongoing for 2 weeks.  The nephew stated the patient is having dizzy spells lasting 10 min. going on for a while; getting worse.   Appointment scheduled for further evaluation; Patient agrees with the plan of care, and will reach out if symptoms worsen or persist.  Protocols used: Dizziness - Lightheadedness-A-AH

## 2025-01-07 ENCOUNTER — Institutional Professional Consult (permissible substitution): Payer: Medicare (Managed Care) | Admitting: Neurology

## 2025-06-01 ENCOUNTER — Ambulatory Visit: Payer: Medicare (Managed Care) | Admitting: Neurology
